# Patient Record
Sex: Male | Born: 1950
Health system: Southern US, Community
[De-identification: ages and names within clinical notes are randomized; demographics above are authoritative.]

## PROBLEM LIST (undated history)

## (undated) DIAGNOSIS — I739 Peripheral vascular disease, unspecified: Secondary | ICD-10-CM

## (undated) DIAGNOSIS — E7800 Pure hypercholesterolemia, unspecified: Secondary | ICD-10-CM

## (undated) DIAGNOSIS — J302 Other seasonal allergic rhinitis: Secondary | ICD-10-CM

## (undated) DIAGNOSIS — F17200 Nicotine dependence, unspecified, uncomplicated: Secondary | ICD-10-CM

## (undated) DIAGNOSIS — R32 Unspecified urinary incontinence: Secondary | ICD-10-CM

## (undated) DIAGNOSIS — I10 Essential (primary) hypertension: Secondary | ICD-10-CM

## (undated) DIAGNOSIS — M1990 Unspecified osteoarthritis, unspecified site: Secondary | ICD-10-CM

## (undated) DIAGNOSIS — E119 Type 2 diabetes mellitus without complications: Secondary | ICD-10-CM

## (undated) DIAGNOSIS — I219 Acute myocardial infarction, unspecified: Secondary | ICD-10-CM

## (undated) HISTORY — DX: Essential (primary) hypertension: I10

## (undated) HISTORY — DX: Other seasonal allergic rhinitis: J30.2

## (undated) HISTORY — DX: Peripheral vascular disease, unspecified: I73.9

## (undated) HISTORY — DX: Unspecified osteoarthritis, unspecified site: M19.90

## (undated) HISTORY — DX: Unspecified urinary incontinence: R32

## (undated) HISTORY — DX: Type 2 diabetes mellitus without complications: E11.9

---

## 1997-12-09 HISTORY — PX: CORONARY STENT PLACEMENT: SHX1402

## 2000-05-07 ENCOUNTER — Emergency Department (HOSPITAL_COMMUNITY): Admission: EM | Admit: 2000-05-07 | Discharge: 2000-05-07 | Payer: Self-pay | Admitting: Emergency Medicine

## 2000-05-07 ENCOUNTER — Encounter: Payer: Self-pay | Admitting: Emergency Medicine

## 2000-08-30 ENCOUNTER — Ambulatory Visit (HOSPITAL_BASED_OUTPATIENT_CLINIC_OR_DEPARTMENT_OTHER): Admission: RE | Admit: 2000-08-30 | Discharge: 2000-08-30 | Payer: Self-pay | Admitting: Family Medicine

## 2002-10-31 ENCOUNTER — Emergency Department (HOSPITAL_COMMUNITY): Admission: EM | Admit: 2002-10-31 | Discharge: 2002-10-31 | Payer: Self-pay | Admitting: Emergency Medicine

## 2002-11-30 ENCOUNTER — Encounter: Admission: RE | Admit: 2002-11-30 | Discharge: 2003-02-28 | Payer: Self-pay | Admitting: Family Medicine

## 2006-06-30 ENCOUNTER — Ambulatory Visit: Admission: RE | Admit: 2006-06-30 | Discharge: 2006-06-30 | Payer: Self-pay | Admitting: Otolaryngology

## 2006-07-22 ENCOUNTER — Ambulatory Visit: Payer: Self-pay | Admitting: Cardiology

## 2006-08-25 ENCOUNTER — Encounter (INDEPENDENT_AMBULATORY_CARE_PROVIDER_SITE_OTHER): Payer: Self-pay | Admitting: *Deleted

## 2006-08-25 ENCOUNTER — Observation Stay (HOSPITAL_COMMUNITY): Admission: RE | Admit: 2006-08-25 | Discharge: 2006-08-26 | Payer: Self-pay | Admitting: Otolaryngology

## 2007-02-12 ENCOUNTER — Ambulatory Visit: Payer: Self-pay | Admitting: Cardiology

## 2007-02-12 LAB — CONVERTED CEMR LAB
ALT: 30 units/L (ref 0–40)
AST: 25 units/L (ref 0–37)
Albumin: 3.6 g/dL (ref 3.5–5.2)
Alkaline Phosphatase: 117 units/L (ref 39–117)
Bilirubin, Direct: 0.1 mg/dL (ref 0.0–0.3)
Cholesterol: 174 mg/dL (ref 0–200)
Direct LDL: 73.5 mg/dL
HDL: 41.5 mg/dL (ref >39.0)
Hgb A1c MFr Bld: 11.3 % — ABNORMAL HIGH (ref 4.6–6.0)
Total Bilirubin: 0.9 mg/dL (ref 0.3–1.2)
Total CHOL/HDL Ratio: 4.2
Total Protein: 7 g/dL (ref 6.0–8.3)
Triglycerides: 440 mg/dL (ref 0–149)
VLDL: 88 mg/dL — ABNORMAL HIGH (ref 0–40)

## 2008-10-13 ENCOUNTER — Ambulatory Visit: Payer: Self-pay | Admitting: Cardiology

## 2008-11-07 ENCOUNTER — Ambulatory Visit: Payer: Self-pay

## 2011-04-23 NOTE — Assessment & Plan Note (Signed)
[facility]                            CARDIOLOGY OFFICE NOTE   NAME:Shontz, JESSEE MEZERA                    MRN:          [id number]  DATE:10/13/2008                            DOB:          02-18-1951    Mr. Mester comes in today to establish with me as his cardiologist.  He  has been a former patient of Dr. Susa Griffins at [facility]  [facility] and then Dr. Randa Evens.   He is currently 60 years of age.  His risk factors for coronary disease  include hypertension, hyperlipidemia, type 2 diabetes, and obesity.  He  had become fairly sedentary having worked out a lot in the past.   His cardiac history began in 1999 when he had an acute inferior wall  infarct.  He ended up getting a bare metal stent to the right coronary  artery.  He had nonobstructive disease in his LAD.  His best I can tell  we have done no objective assessments, his coronaries here in quite some  time.   He is followed by Dr. Holley Bouche, a primary care.  He follows his  blood work, as well as manages his lipids, diabetes, and hypertension.   He denies any angina or ischemic equivalents.  He denies any orthopnea,  PND, or peripheral edema.  He has had no syncope, presyncope, or tachy  palpitations.  He does have sleep apnea, wear CPAP.   He is currently on Plavix 75 mg a day, glimepiride 2 mg daily,  lisinopril 20 mg a day, simvastatin 40 mg a day, metformin 1000 mg p.o.  b.i.d.   He is intolerant of ASPIRIN which causes bleeding from his stomach 3  days after he starts taking it.  He is also allergic to LATEX.   PHYSICAL EXAMINATION:  VITAL SIGNS:  Blood pressure is elevated today at  154/86, pulse is 90 and regular, weight is 245 which is actually up 22  pounds since March 2008.  HEENT:  Essentially normal.  Carotid upstrokes were equal bilaterally  without bruits, no JVD.  Thyroid is not enlarged.  Trachea is midline.  LUNGS:  Clear to auscultation and  percussion.  HEART:  Poorly appreciated PMI, soft S1 and S2.  ABDOMINAL:  Protuberant, good bowel sounds.  No midline bruit.  No  hepatomegaly.  EXTREMITIES:  No cyanosis, clubbing, or edema.  Pulses are intact.  NEURO:  Intact.  SKIN:  Unremarkable.   His electrocardiogram shows sinus rhythm, inferior wall infarct which is  old, and a slight left axis deviation.   I had a long talk today getting to Mr. Friedlander.  We talked about his  symptom complex with his MI and possible future angina or MI.  We have  also reviewed about how to activate 911.   I have encouraged him to lose weight and exercise 3 hours a week.   We will set up an exercise rest stress Myoview to objectively assess his  coronary disease, any progression there.  He will continue with  aggressive secondary risk factor modification to Dr. Tiburcio Pea.  I will see  him back otherwise in a year.     Thomas C. Daleen Squibb, MD, [facility]  Electronically Signed    TCW/MedQ  DD: 10/13/2008  DT: 10/14/2008  Job #: [id number]

## 2011-04-26 NOTE — Assessment & Plan Note (Signed)
[facility]                              CARDIOLOGY OFFICE NOTE   Clinton Harrington, Clinton Harrington                    MRN:          [id number]  DATE:07/22/2006                            DOB:          1951-12-09    PRIMARY CARE PHYSICIAN:  Dr. Majel Homer   REFERRING PHYSICIAN:  Dr. Ezzard Standing of ENT.   REASON FOR CONSULTATION:  The patient referred by Dr. Ezzard Standing for  preoperative cardiovascular assessment prior to sinus surgery.   HISTORY OF PRESENT ILLNESS:  The patient is a 60 year old gentleman who  suffered inferior myocardial infarction in 1999.  He was cared for at that  time by Dr. Alanda Amass.  He had a bare-metal stent placed in his mid right  coronary artery.  He had no other significant coronary disease, and the  ejection fraction was 45-50% at that time.  Since then, the patient tells me  that he has done well.  He denies any chest pain, exertional dyspnea, PND,  orthopnea, edema, palpitations, syncope, presyncope and claudication.  He  remains active, working in the Advertising copywriter at [employer].  He says he  can clearly go up a flight or two of stairs with no symptoms or limitations.   PAST MEDICAL HISTORY:  1. Coronary artery disease, status post inferior myocardial infarction and      bare-metal stent placement in the right coronary artery in 1999.  2. Sleep apnea.  3. GERD.  4. Diabetes mellitus.  5. Hypertension.   ALLERGIES:  ASPIRIN causes GI upset, questionable bleed.  He is allergic to  LATEX.   CURRENT MEDICATIONS:  The patient is unsure of his medications, and did not  bring them today.  He is taking metformin 500 mg twice per day.  He is  taking lisinopril, but is unclear of the dose.  He is taking a cholesterol  medicine, but cannot recall which one or the dose.  He is not taking aspirin  or Plavix.   SOCIAL HISTORY:  The patient is divorced with a 15 year old daughter, and a  single grandchild.  He works in the Naval architect at [employer].  He  enjoys Barrister's clerk.  He continues to smoke at least a pack per day.  He denies  alcohol and illicit drug use.  When I asked him about quitting, he says he  is not particularly interested in doing so.   FAMILY HISTORY:  The patient does not know any details of his father's  health.  His mother is alive and well at 47.  His brother is my patient, who  has premature onset coronary artery disease.  His daughter is alive and  well.   REVIEW OF SYSTEMS:  Partial dentures, occasionally allergic rhinitis.  Otherwise negative in detail, except as above.   PHYSICAL EXAMINATION:  GENERAL:  He is overweight, but otherwise well-  appearing, in no distress.  VITAL SIGNS:  Heart rate is 98, blood pressure 132/80 on the left.  He is 5  feet 8 inches tall, and weighs 242 pounds.  HEENT:  Normal.  SKIN:  Exam is normal.  MUSCULOSKELETAL:  Exam is normal.  NECK:  He has no jugular venous distention, thyromegaly or lymphadenopathy.  LUNGS:  Clear to auscultation.  Respiratory effort is normal.  CARDIAC:  He has a nondisplaced point of maximal cardiac impulse.  There is  a regular rate and rhythm without murmur, rub or gallop.  ABDOMEN:  Soft, nondistended and nontender.  There is no hepatosplenomegaly.  Bowel sounds are normal.  EXTREMITIES:  Warm without clubbing, cyanosis, edema or ulceration.  Carotid  pulses are 2+ bilaterally without bruit.  Femoral pulses 2+ bilaterally  without bruit.  PT pulses 2+ bilaterally.  NEUROLOGIC:  He is alert and oriented x3 with normal neurologic exam and  normal affect.   Electrocardiogram demonstrates normal sinus rhythm with old inferior  myocardial infarction.   IMPRESSION/RECOMMENDATIONS:  17. 60 year old gentleman with prior inferior myocardial infarction, but no      current symptoms of angina or congestive heart failure, and good      exercise tolerance.  This places him at low risk for perioperative      cardiac  complication.  I have prescribed atenolol 50 mg per day to be      begun today and continued throughout the preoperative period.  This      will further reduce his perioperative risk.  I have asked him to      continue his statin throughout that period as well.   I will plan on seeing him back in 3 months' time to assess his tolerance of  the beta blocker, and potentially up-titrate his other medications for blood  pressure control, if necessary.  I will obtain records regarding his lipid  control from Dr. Daphine Deutscher.   Finally, it is clear that he needs an antiplatelet agent, given his coronary  disease.  He is convinced that he cannot tolerate aspirin, even at a reduced  dose and enteric-coated.  We will therefore plan on beginning Plavix after  his upcoming surgery.                                 Clinton Farber, Clinton Harrington    WED/MedQ  DD:  07/22/2006  DT:  07/23/2006  Job #:  [id number]

## 2011-04-26 NOTE — Assessment & Plan Note (Signed)
[facility]                            CARDIOLOGY OFFICE NOTE   Clinton Harrington, Clinton Harrington                    MRN:          [id number]  DATE:02/12/2007                            DOB:          Nov 05, 1951    PRIMARY CARE PHYSICIAN:  Clinton Harrington.   HISTORY OF PRESENT ILLNESS:  [name] is a 60 year old gentleman who  suffered inferior myocardial infarction in 1999.  He had bare metal  stent placed in his right coronary artery.  The EF was 45-50% at that  time.   Since then, he has done nicely.  He has had no recurrent chest  discomfort, exertional dyspnea, PND, orthopnea, edema, claudication,  palpitations, syncope or presyncope.  He remains active, working in the  Advertising copywriter at [employer].   CURRENT MEDICATIONS:  1. Metformin 500 mg twice daily.  2. Lisinopril 10 mg daily.  3. Plavix 75 mg daily.  4. Zocor (question dose).   PHYSICAL EXAMINATION:  He is generally well appearing though overweight,  in no distress, with a heart rate 84, blood pressure 142/96 and weight  of 223 pounds.  He has no jugular venous distention, thyromegaly or  lymphadenopathy.  LUNGS:  Clear to auscultation.  He has a nondisplaced point of maximal  cardiac impulse.  There is a regular rate and rhythm without murmur,  rub, or gallop.  ABDOMEN:  Soft, nondistended, nontender.  There is no  hepatosplenomegaly.  Bowel sounds are normal . There is midline  pulsatile mass.  EXTREMITIES:  Warm and without edema.  Carotid pulses 2+ bilaterally  without bruit.   Electrocardiogram demonstrates normal sinus rhythm and old inferior  myocardial infarction.  It is unchanged from prior.   IMPRESSION RECOMMENDATIONS:  1. Coronary disease status post prior inferior myocardial infarction:      Continue Plavix as he is intolerant to aspirin.  Resume beta-      blocker and increase lisinopril to 20 mg daily given his diabetes.  2. Diabetes mellitus:  Managed by Dr. Stacie Harrington.  I  received a note from      his insurer questioning whether he is filling his prescriptions for      his metformin.  Will therefore check hemoglobin A1c so that this is      available to Dr. Stacie Harrington when he sees the patient next week.  3. Hypercholesterolemia:  Check lipids and LFTs today.  Continue Zocor      pending results.  4. Risk factor modification:  Recommended exercise in compliance with      low cholesterol diet.  5. Tobacco abuse:  Advised him to quit smoking entirely.     Clinton Farber, MD  Electronically Signed    WED/MedQ  DD: 02/12/2007  DT: 02/12/2007  Job #: [id number]   cc:   Clinton Crochet, MD

## 2011-04-26 NOTE — Op Note (Signed)
NAMEVIVAN, Clinton Harrington             ACCOUNT NO.:  [id number]   MEDICAL RECORD NO.:  [id number]          PATIENT TYPE:  OBV   LOCATION:  3305                         FACILITY:  [facility]   PHYSICIAN:  Kristine Garbe. Ezzard Standing, M.D.DATE OF BIRTH:  1950-12-19   DATE OF PROCEDURE:  08/25/2006  DATE OF DISCHARGE:                                 OPERATIVE REPORT   PREOPERATIVE DIAGNOSES:  1. Septal deviation to the right with right-sided nasal obstruction.  2. Turbinate hypertrophy.  3. Chronic bilateral sinus disease.  4. Obstructive sleep apnea.   POSTOPERATIVE DIAGNOSES:  1. Septal deviation to the right with right-sided nasal obstruction.  2. Turbinate hypertrophy.  3. Chronic bilateral sinus disease.  4. Obstructive sleep apnea.   OPERATIONS:  Septoplasty with bilateral inferior turbinate reductions.  Functional endoscopic sinus surgery with bilateral ethmoidectomies and  bilateral maxillary osteal enlargement.   SURGEON:  Kristine Garbe. Ezzard Standing, M.D.   ANESTHESIA:  General endotracheal.   COMPLICATIONS:  None.   BRIEF CLINICAL NOTE:  Clinton Harrington is a 60 year old gentleman who has had  chronic nasal/sinus problems for a number of years.  He has chronic  obstruction mostly on the right side of his nose.  He recently underwent a  CT scan which showed a septal deviation to the right with chronic bilateral  ethmoid disease and some mucosal thickening within the frontal region, as  well as a cyst along the floor of the left maxillary sinus.  Sphenoid  sinuses were clear.  Because of chronic nasal obstruction and history of  obstructive sleep apnea and symptoms of pressure between his eyes secondary  to chronic sinus disease, he was taken to the operating room at this time  for septoplasty and turbinate reduction to help improve his nasal airway and  endoscopic sinus surgery to help improve the sinus patency.   DESCRIPTION OF PROCEDURE:  After adequate endotracheal anesthesia,  patient  received 1 gm Ancef IV preoperatively.  The nose was prepped with Betadine  solution, draped out with sterile towels.  The nose was then further prepped  with cotton pledgets soaked in decongestant, and septum and turbinates were  injected with Xylocaine with epinephrine.  A hemitransfixed incision was  made along the caudal edges of the septum on the right side, mucoperiosteum.  Mucoperiosteal flaps elevated posteriorly.  Patient had a bony deviation to  the right with a spur more posterior to the right.  The portion of the  maxillary crest and bony spur to that protrusion to the right airway were  removed after elevating mucoperiosteal flaps on either side.  This allowed  the septum to return more to midline.   Next, ethmoidectomies were performed bilaterally.  A sickle knife was used  to incise the uncinate process.  The anterior and posterior ethmoidal cells  were then opened with the microdebrider as well as straight-through cup and  up-through cup biting forceps.  The maxillary ostium was identified.  It was  enlarged with biting through-cutting forceps on both sides.  On the right  side, the sinus was clear.  On the left side, there was a small benign-  appearing polyp or cyst along the floor of the sinus but everything else was  clear.  This was not obstructing.  There was a polypoid, very thickened  mucosa, some polypoid changes within the ethmoid area especially anteriorly  and superiorly in the nasal frontal region.  This was removed with up-  grasping forceps and the microdebrider.  After cleaning out the ethmoid  area, the procedure was completed.   Lastly, inferior turbinate reductions were performed.  On the left side, an  incision was made along the inferior edges of the turbinate.  Mucosal flaps  were elevated off the turbinate bone.  The turbinate bone was removed.  Submucosal cauterization was performed and the remaining turbinate was out-  fractured.  On the  right side, the lower one-half of the turbinate was  amputated.  Suction cautery was used for hemostasis and the remaining  turbinate tissue was out-fractured.  This completed the procedure.   Kennedy sinus packs were placed within the ethmoid areas bilaterally.  The  hemitransfixed incision was closed with interrupted 4-0 chromic suture and  the nose was packed with Telfa soaked in Bacitracin ointment.  Clinton Harrington was  awoke from anesthesia and transferred to the recovery room, postoperatively  doing well.   DISPOSITION:  Clinton Harrington will be observed overnight to monitor his obstructive  sleep apnea.  Will plan on removing his nasal packs tomorrow morning and  discharging him home on Vicodin p.r.n. pain and Keflex 500 mg b.i.d. for 1  week.  Will have him follow up in my office in 3 days to have his Clinton Harrington  sinus packs removed.           ______________________________  Kristine Garbe Ezzard Standing, M.D.     CEN/MEDQ  D:  08/25/2006  T:  08/25/2006  Job:  [id number]   cc:   Royetta Crochet, MD

## 2012-08-05 ENCOUNTER — Emergency Department (HOSPITAL_COMMUNITY)
Admission: EM | Admit: 2012-08-05 | Discharge: 2012-08-05 | Disposition: A | Discharge status: Home / Self Care | Payer: Medicaid Other | Attending: Emergency Medicine | Admitting: Emergency Medicine

## 2012-08-05 ENCOUNTER — Encounter (HOSPITAL_COMMUNITY): Payer: Self-pay | Admitting: *Deleted

## 2012-08-05 DIAGNOSIS — Z888 Allergy status to other drugs, medicaments and biological substances status: Secondary | ICD-10-CM | POA: Insufficient documentation

## 2012-08-05 DIAGNOSIS — F17200 Nicotine dependence, unspecified, uncomplicated: Secondary | ICD-10-CM | POA: Insufficient documentation

## 2012-08-05 DIAGNOSIS — Z91040 Latex allergy status: Secondary | ICD-10-CM | POA: Insufficient documentation

## 2012-08-05 DIAGNOSIS — L0100 Impetigo, unspecified: Secondary | ICD-10-CM | POA: Insufficient documentation

## 2012-08-05 DIAGNOSIS — B354 Tinea corporis: Secondary | ICD-10-CM | POA: Insufficient documentation

## 2012-08-05 DIAGNOSIS — I252 Old myocardial infarction: Secondary | ICD-10-CM | POA: Insufficient documentation

## 2012-08-05 DIAGNOSIS — B350 Tinea barbae and tinea capitis: Secondary | ICD-10-CM | POA: Insufficient documentation

## 2012-08-05 DIAGNOSIS — E7800 Pure hypercholesterolemia, unspecified: Secondary | ICD-10-CM | POA: Insufficient documentation

## 2012-08-05 DIAGNOSIS — E119 Type 2 diabetes mellitus without complications: Secondary | ICD-10-CM | POA: Insufficient documentation

## 2012-08-05 HISTORY — DX: Acute myocardial infarction, unspecified: I21.9

## 2012-08-05 HISTORY — DX: Pure hypercholesterolemia, unspecified: E78.00

## 2012-08-05 MED ORDER — MUPIROCIN CALCIUM 2 % NA OINT: TOPICAL_OINTMENT | NASAL | Status: DC

## 2012-08-05 MED ORDER — MUPIROCIN CALCIUM 2 % EX CREA
TOPICAL_CREAM | Freq: Once | CUTANEOUS | Status: AC
  Administered 2012-08-05: 13:00:00 via TOPICAL
  Filled 2012-08-05: qty 15

## 2012-08-05 MED ORDER — OXYCODONE-ACETAMINOPHEN 5-325 MG PO TABS: 2.0000 | ORAL_TABLET | ORAL | Status: AC | PRN

## 2012-08-05 MED ORDER — CEPHALEXIN 500 MG PO CAPS: 500.0000 mg | ORAL_CAPSULE | Freq: Four times a day (QID) | ORAL | Status: AC

## 2012-08-05 MED ORDER — SULFAMETHOXAZOLE-TRIMETHOPRIM 800-160 MG PO TABS: 1.0000 | ORAL_TABLET | Freq: Two times a day (BID) | ORAL | Status: AC

## 2012-08-05 MED ORDER — KETOCONAZOLE 200 MG PO TABS: 200.0000 mg | ORAL_TABLET | Freq: Every day | ORAL | Status: DC

## 2012-08-05 MED ORDER — MICONAZOLE NITRATE 2 % EX CREA: TOPICAL_CREAM | Freq: Two times a day (BID) | CUTANEOUS | Status: DC

## 2012-08-05 NOTE — ED Notes (Signed)
Multiple red spots on both legs that itch x 2 weeks had been working outside he states now rash is on rt hand and behind ears  Also itchy

## 2012-08-05 NOTE — ED Provider Notes (Signed)
History     CSN: [id number]  Arrival date & time 08/05/12  8295   First MD Initiated Contact with Patient 08/05/12 1016      Chief Complaint  Patient presents with  . Rash    (Consider location/radiation/quality/duration/timing/severity/associated sxs/prior treatment) Patient is a 61 y.o. male presenting with rash. The history is provided by the patient and the spouse. No language interpreter was used.  Rash  This is a chronic problem. The current episode started more than 1 week ago. The problem has been gradually worsening. The problem is associated with chemical exposure. There has been no fever. The rash is present on the left lower leg and right lower leg. He has tried OTC analgesics for the symptoms. The treatment provided no relief.  61 yo male diabetic with red raised rash to bilateral LE x 2 weeks. And new Rash to R hand and behind R ear.  States that the rash keeps him up at night.  Has taken nsaids for pain without much relief.   Denies fever/n/v. Also states he has not taken his diabetes medicine today and he drank a coke.  pmh diabetes, mi, high cholesterol, staph infection.  Smoker.    Past Medical History  Diagnosis Date  . Myocardial infarct   . Diabetes mellitus   . High cholesterol     Past Surgical History  Procedure Date  . Coronary stent placement     History reviewed. No pertinent family history.  History  Substance Use Topics  . Smoking status: Current Everyday Smoker  . Smokeless tobacco: Not on file  . Alcohol Use:       Review of Systems  Constitutional: Negative.   HENT: Negative.   Eyes: Negative.   Respiratory: Negative.   Cardiovascular: Negative.   Gastrointestinal: Negative.   Musculoskeletal: Negative for joint swelling and gait problem.  Skin: Positive for rash.  Neurological: Negative.   Psychiatric/Behavioral: Negative.   All other systems reviewed and are negative.    Allergies  Aspirin and Latex  Home Medications    Current Outpatient Rx  Name Route Sig Dispense Refill  . GLIMEPIRIDE 4 MG PO TABS Oral Take 4 mg by mouth 2 (two) times daily.    Marland Kitchen LISINOPRIL 40 MG PO TABS Oral Take 40 mg by mouth daily.    Marland Kitchen METFORMIN HCL 1000 MG PO TABS Oral Take 1,000 mg by mouth 2 (two) times daily with a meal.    . SIMVASTATIN 40 MG PO TABS Oral Take 40 mg by mouth every evening.      BP 150/83  Pulse 90  Temp 98 F (36.7 C) (Oral)  Resp 20  SpO2 95%  Physical Exam  Nursing note and vitals reviewed. Constitutional: He is oriented to person, place, and time. He appears well-developed and well-nourished.  HENT:  Head: Normocephalic.  Eyes: Conjunctivae and EOM are normal. Pupils are equal, round, and reactive to light.  Neck: Normal range of motion. Neck supple.  Cardiovascular: Normal rate.   Pulmonary/Chest: Effort normal and breath sounds normal. No respiratory distress.  Abdominal: Soft.  Musculoskeletal: Normal range of motion. He exhibits no edema and no tenderness.  Neurological: He is alert and oriented to person, place, and time. No cranial nerve deficit. Coordination normal.  Skin: Skin is warm and dry. Rash noted.       Elevated erythematous, papular, annular lesions to R hand and behind L ear.  Crusty appearance.   Rash red raised to bilateral LE ? Infected.  Different appearance than upper body rash.    Psychiatric: He has a normal mood and affect.    ED Course  Procedures (including critical care time)  Labs Reviewed - No data to display No results found.   No diagnosis found.    MDM  Rash x 2 weeks to LE and several days to R hand and behind L ear.  Two separate rashes.  Impetigo to LE with treat with po antibiotics keflex and bactrim ds with topical bacrtroban.  Upper body rash appears to be tinea corporis and tinea capitis with treat with topical cream.  Elevated cbg after ingetsted coke and did not take diabetes meds.  Will take meds when she gets home, drink plenty of water and  no more soda.  Continue working on getting medicare/medicaid so he can go back to Dr. Tiburcio Pea.  Return to ER in 2 days for recheck.  Return sooner for n/v or fever.       Remi Haggard, NP 08/06/12 1610

## 2012-08-05 NOTE — ED Notes (Signed)
Pt reports approx a week hx of bilateral rash to lower legs associated with itching and pain. Pt reports no hx of shingles.

## 2012-08-07 ENCOUNTER — Emergency Department (HOSPITAL_COMMUNITY)
Admission: EM | Admit: 2012-08-07 | Discharge: 2012-08-07 | Disposition: A | Discharge status: Home / Self Care | Payer: Medicaid Other | Attending: Emergency Medicine | Admitting: Emergency Medicine

## 2012-08-07 ENCOUNTER — Encounter (HOSPITAL_COMMUNITY): Payer: Self-pay

## 2012-08-07 DIAGNOSIS — Z9861 Coronary angioplasty status: Secondary | ICD-10-CM | POA: Insufficient documentation

## 2012-08-07 DIAGNOSIS — I252 Old myocardial infarction: Secondary | ICD-10-CM | POA: Insufficient documentation

## 2012-08-07 DIAGNOSIS — Z886 Allergy status to analgesic agent status: Secondary | ICD-10-CM | POA: Insufficient documentation

## 2012-08-07 DIAGNOSIS — Z91040 Latex allergy status: Secondary | ICD-10-CM | POA: Insufficient documentation

## 2012-08-07 DIAGNOSIS — I2510 Atherosclerotic heart disease of native coronary artery without angina pectoris: Secondary | ICD-10-CM | POA: Insufficient documentation

## 2012-08-07 DIAGNOSIS — E119 Type 2 diabetes mellitus without complications: Secondary | ICD-10-CM | POA: Insufficient documentation

## 2012-08-07 DIAGNOSIS — R21 Rash and other nonspecific skin eruption: Secondary | ICD-10-CM | POA: Insufficient documentation

## 2012-08-07 DIAGNOSIS — F17200 Nicotine dependence, unspecified, uncomplicated: Secondary | ICD-10-CM | POA: Insufficient documentation

## 2012-08-07 DIAGNOSIS — E7800 Pure hypercholesterolemia, unspecified: Secondary | ICD-10-CM | POA: Insufficient documentation

## 2012-08-07 LAB — POCT I-STAT, CHEM 8
BUN: 9 mg/dL (ref 6–23)
Calcium, Ion: 1.18 mmol/L (ref 1.12–1.23)
Creatinine, Ser: 1 mg/dL (ref 0.50–1.35)
Glucose, Bld: 182 mg/dL — ABNORMAL HIGH (ref 70–99)
Hemoglobin: 16.3 g/dL (ref 13.0–17.0)
Sodium: 137 mEq/L (ref 135–145)
TCO2: 26 mmol/L (ref 0–100)

## 2012-08-07 LAB — CBC WITH DIFFERENTIAL/PLATELET
Basophils Relative: 1 % (ref 0–1)
Eosinophils Absolute: 0.2 10*3/uL (ref 0.0–0.7)
Eosinophils Relative: 2 % (ref 0–5)
Hemoglobin: 15.5 g/dL (ref 13.0–17.0)
MCH: 29.9 pg (ref 26.0–34.0)
MCHC: 35.1 g/dL (ref 30.0–36.0)
MCV: 85.3 fL (ref 78.0–100.0)
Monocytes Absolute: 0.5 10*3/uL (ref 0.1–1.0)
Monocytes Relative: 5 % (ref 3–12)
Neutrophils Relative %: 69 % (ref 43–77)

## 2012-08-07 MED ORDER — TRAMADOL-ACETAMINOPHEN 37.5-325 MG PO TABS
ORAL_TABLET | ORAL | Status: AC
Start: 2012-08-07 — End: 2012-08-17

## 2012-08-07 NOTE — ED Notes (Signed)
Pt presents for re-evaluation of skin infection.  Pt seen here on Wednesday for infection to both legs and R hand, placed on 2 abx, 2 topicals and pain medication.  Pt reports sites are not quite as painful but reports generalized itching since taking medication.  Pt reports taking pain medication at night and wakes up with itching.

## 2012-08-07 NOTE — ED Provider Notes (Signed)
Medical screening examination/treatment/procedure(s) were performed by non-physician practitioner and as supervising physician I was immediately available for consultation/collaboration.   Anelise Staron, MD 08/07/12 0725 

## 2012-08-07 NOTE — ED Provider Notes (Addendum)
History   This chart was scribed for Ward Givens, MD by Toya Smothers. The patient was seen in room TR05C/TR05C. Patient's care was started at 1414.  CSN: [id number]  Arrival date & time 08/07/12  1414   First MD Initiated Contact with Patient 08/07/12 1702     No chief complaint on file.  HPI  Clinton Harrington is a 61 y.o. male who presents to the Emergency Department complaining for follow up to ring worm and rash on his legs diagnosis. Pt reports that size and redness have decreased mildly in the past 2 days. Pt denotes that his feet have swollen since arrival to the ED. Pt reports that he has been using ointment and pain medication, though  has been causing upset stomach. Pt denies myalgia, emesis, nausea, and fever.  Pt states the rash started about 2 weeks ago. Starts as small red spots then gets bigger and are pruritic and painful. He denies fever. He states since he started on the antibiotics the pain is better, but he has run out of the ? T#3 he was given last visit (review of chart shows he was given oxycodone for the pain). Wife postulates he was exposed to poison oak/ivy while mowing, but the lesions do not look like contact dermatitis.   PCP none (states dismissed from [facility] on July 31st)  Past Medical History  Diagnosis Date  . Myocardial infarct   . Diabetes mellitus   . High cholesterol     Past Surgical History  Procedure Date  . Coronary stent placement     No family history on file.  History  Substance Use Topics  . Smoking status: Current Everyday Smoker  . Smokeless tobacco: Not on file  . Alcohol Use:   Lives at home Lives with spouse   Review of Systems  Constitutional: Negative for fever.       10 Systems reviewed and are negative for acute change except as noted in the HPI.  HENT: Negative for congestion.   Eyes: Negative for discharge and redness.  Respiratory: Negative for cough and shortness of breath.   Cardiovascular: Negative for chest pain.   Gastrointestinal: Negative for vomiting and abdominal pain.  Musculoskeletal: Negative for back pain.  Skin: Positive for rash.  Neurological: Negative for syncope, numbness and headaches.  Psychiatric/Behavioral:       No behavior change.  All other systems reviewed and are negative.    Allergies  Aspirin; Oxycodone-acetaminophen; and Latex  Home Medications   Current Outpatient Rx  Name Route Sig Dispense Refill  . CEPHALEXIN 500 MG PO CAPS Oral Take 1 capsule (500 mg total) by mouth 4 (four) times daily. 20 capsule 0  . GLIMEPIRIDE 4 MG PO TABS Oral Take 4 mg by mouth 2 (two) times daily.    Marland Kitchen LISINOPRIL 40 MG PO TABS Oral Take 40 mg by mouth daily.    Marland Kitchen METFORMIN HCL 1000 MG PO TABS Oral Take 1,000 mg by mouth 2 (two) times daily with a meal.    . MICONAZOLE NITRATE 2 % EX CREA Topical Apply topically 2 (two) times daily. 28.35 g 0  . OXYCODONE-ACETAMINOPHEN 5-325 MG PO TABS Oral Take 2 tablets by mouth every 4 (four) hours as needed for pain. 6 tablet 0  . SIMVASTATIN 40 MG PO TABS Oral Take 40 mg by mouth every evening.    . SULFAMETHOXAZOLE-TRIMETHOPRIM 800-160 MG PO TABS Oral Take 1 tablet by mouth every 12 (twelve) hours. 10 tablet 0  BP 130/88  Pulse 90  Temp 98.6 F (37 C) (Oral)  Resp 16  Ht 5\' 7"  (1.702 m)  Wt 225 lb (102.059 kg)  BMI 35.24 kg/m2  SpO2 99%  Vital signs normal    Physical Exam  Nursing note and vitals reviewed. Constitutional: He appears well-developed and well-nourished. No distress.  HENT:  Head: Normocephalic and atraumatic.  Right Ear: External ear normal.  Left Ear: External ear normal.  Mouth/Throat: Oropharynx is clear and moist.  Eyes: Conjunctivae are normal. Pupils are equal, round, and reactive to light. Right eye exhibits no discharge. Left eye exhibits no discharge. No scleral icterus.  Neck: Normal range of motion. Neck supple. No tracheal deviation present.  Cardiovascular: Normal rate.   Pulmonary/Chest: Effort  normal. No stridor. No respiratory distress.  Musculoskeletal: He exhibits no edema.  Neurological: He is alert. Cranial nerve deficit: no gross deficits.  Skin: Skin is warm and dry. No rash noted.       Scattered red round lesions on both lower legs that are variable in size and are not raised. Some have grey center. There are no blisters of vesicles. Non-linear. No red streaking. No swelling. No lesion to feet. Large lesion on the dorsum of the R hand consistent with ring worm.  Psychiatric: He has a normal mood and affect. His behavior is normal.    ED Course  Procedures (including critical care time) DIAGNOSTIC STUDIES: Oxygen Saturation is 98% on room air, normal by my interpretation.    COORDINATION OF CARE: 1755- Evaluated Pt. Pt is without distress  Lab work done to check platelet count and to do sed rate for possible vasculitis  Results for orders placed during the hospital encounter of 08/07/12  CBC WITH DIFFERENTIAL      Component Value Range   WBC 9.4  4.0 - 10.5 K/uL   RBC 5.18  4.22 - 5.81 MIL/uL   Hemoglobin 15.5  13.0 - 17.0 g/dL   HCT 11.9  14.7 - 82.9 %   MCV 85.3  78.0 - 100.0 fL   MCH 29.9  26.0 - 34.0 pg   MCHC 35.1  30.0 - 36.0 g/dL   RDW 56.2  13.0 - 86.5 %   Platelets 259  150 - 400 K/uL   Neutrophils Relative 69  43 - 77 %   Neutro Abs 6.5  1.7 - 7.7 K/uL   Lymphocytes Relative 23  12 - 46 %   Lymphs Abs 2.2  0.7 - 4.0 K/uL   Monocytes Relative 5  3 - 12 %   Monocytes Absolute 0.5  0.1 - 1.0 K/uL   Eosinophils Relative 2  0 - 5 %   Eosinophils Absolute 0.2  0.0 - 0.7 K/uL   Basophils Relative 1  0 - 1 %   Basophils Absolute 0.1  0.0 - 0.1 K/uL  SEDIMENTATION RATE      Component Value Range   Sed Rate 28 (*) 0 - 16 mm/hr  POCT I-STAT, CHEM 8      Component Value Range   Sodium 137  135 - 145 mEq/L   Potassium 4.5  3.5 - 5.1 mEq/L   Chloride 100  96 - 112 mEq/L   BUN 9  6 - 23 mg/dL   Creatinine, Ser 7.84  0.50 - 1.35 mg/dL   Glucose, Bld 696  (*) 70 - 99 mg/dL   Calcium, Ion 2.95  2.84 - 1.23 mmol/L   TCO2 26  0 - 100 mmol/L  Hemoglobin 16.3  13.0 - 17.0 g/dL   HCT 16.1  09.6 - 04.5 %    Laboratory interpretation all normal except minor elevation of SED rate and hyperglycemia     1. Rash    New Prescriptions   TRAMADOL-ACETAMINOPHEN (ULTRACET) 37.5-325 MG PER TABLET    2 tabs po QID prn pain    Plan discharge  Devoria Albe, MD, FACEP    MDM     I personally performed the services described in this documentation, which was scribed in my presence. The recorded information has been reviewed and considered. Devoria Albe, MD, FACEP    Ward Givens, MD 08/07/12 Rushie Goltz  Ward Givens, MD 08/07/12 [phone]

## 2012-08-07 NOTE — ED Notes (Signed)
Wife upset about the wait.  She was told to being him back today .  She feels like they should be taken back right away

## 2012-10-30 ENCOUNTER — Encounter: Payer: Self-pay | Admitting: Family Medicine

## 2012-10-30 ENCOUNTER — Ambulatory Visit (INDEPENDENT_AMBULATORY_CARE_PROVIDER_SITE_OTHER): Payer: Self-pay | Admitting: Family Medicine

## 2012-10-30 VITALS — BP 160/96 | HR 107 | Temp 98.1°F | Ht 67.0 in | Wt 225.0 lb

## 2012-10-30 DIAGNOSIS — I2510 Atherosclerotic heart disease of native coronary artery without angina pectoris: Secondary | ICD-10-CM

## 2012-10-30 DIAGNOSIS — F17200 Nicotine dependence, unspecified, uncomplicated: Secondary | ICD-10-CM

## 2012-10-30 DIAGNOSIS — E785 Hyperlipidemia, unspecified: Secondary | ICD-10-CM

## 2012-10-30 DIAGNOSIS — E119 Type 2 diabetes mellitus without complications: Secondary | ICD-10-CM | POA: Insufficient documentation

## 2012-10-30 DIAGNOSIS — Z720 Tobacco use: Secondary | ICD-10-CM | POA: Insufficient documentation

## 2012-10-30 DIAGNOSIS — I10 Essential (primary) hypertension: Secondary | ICD-10-CM

## 2012-10-30 LAB — COMPREHENSIVE METABOLIC PANEL
ALT: 49 U/L (ref 0–53)
AST: 38 U/L — ABNORMAL HIGH (ref 0–37)
Albumin: 4.1 g/dL (ref 3.5–5.2)
Alkaline Phosphatase: 101 U/L (ref 39–117)
Calcium: 9.3 mg/dL (ref 8.4–10.5)
Chloride: 95 mEq/L — ABNORMAL LOW (ref 96–112)
Potassium: 4 mEq/L (ref 3.5–5.1)
Sodium: 130 mEq/L — ABNORMAL LOW (ref 135–145)

## 2012-10-30 LAB — LIPID PANEL
HDL: 34.3 mg/dL — ABNORMAL LOW (ref >39.00)
Total CHOL/HDL Ratio: 7
VLDL: 120.4 mg/dL — ABNORMAL HIGH (ref 0.0–40.0)

## 2012-10-30 LAB — MICROALBUMIN / CREATININE URINE RATIO: Microalb, Ur: 30.9 mg/dL — ABNORMAL HIGH (ref 0.0–1.9)

## 2012-10-30 MED ORDER — PRAVASTATIN SODIUM 40 MG PO TABS: 40.0000 mg | ORAL_TABLET | Freq: Every day | ORAL | Status: DC

## 2012-10-30 MED ORDER — FREESTYLE LANCETS MISC: Status: DC

## 2012-10-30 MED ORDER — LISINOPRIL 40 MG PO TABS: 40.0000 mg | ORAL_TABLET | Freq: Every day | ORAL | Status: DC

## 2012-10-30 MED ORDER — GLIMEPIRIDE 4 MG PO TABS: 4.0000 mg | ORAL_TABLET | Freq: Two times a day (BID) | ORAL | Status: DC

## 2012-10-30 MED ORDER — GLUCOSE BLOOD VI STRP: ORAL_STRIP | Status: DC

## 2012-10-30 MED ORDER — METFORMIN HCL 1000 MG PO TABS: 1000.0000 mg | ORAL_TABLET | Freq: Two times a day (BID) | ORAL | Status: DC

## 2012-10-30 NOTE — Progress Notes (Signed)
Chief Complaint  Patient presents with  . Establish Care    HPI: Clinton Harrington is here to establish care. On review of chart appears was discharged from [facility] practice where he was followed by Dr. Tiburcio Pea. Has not worked since 2010 - terminated after working there for 10 years due to rudeness to customers. Pt reports this is false. Has looked for work and can't find it so applying for disability.  Ran out of all medications a week ago.  Has the following chronic problems and concerns today:  CAD: -1999 Acute inferior wall infarct, BMS to RCA -saw Dr. Daleen Squibb in 2009 and was supposed to get stress Myoview (pt reports stress test was ok) and follow up yearly - on plavix at the time, stopped this years ago -reports CAN NOT take ASA - upsets his stomach -did not follow up  -Denies: CP, SOB, palpitions  DM: -metformin 1000bid and glimeperide 4 mg bid - just ran out -Bld Glu 255 in ED in 07/2012 -BS monitoring at home: does not check at home, has monitor  -hasn't seen an eye doctor in sometime - reports vision is great  HLD: -on simvastatin 40mg  daily just ran out -wants to try different statin, this one upsets his stomach sometimes  HTN: -on lisinopril - but ran out of meds last week  Other Providers: -Dr. Daleen Squibb in cards: has not seen him since 2009 -Dr. Darrick Penna: seeing him for disability determination services. He says for pain in feet and ankles - shooting pains.   OSA: -has CPAP  Vaccines: refused flu vaccine, had tdap 4 years ago, doesn't remember having pneumovax  ROS: See pertinent positives and negatives per HPI.  Past Medical History  Diagnosis Date  . Myocardial infarct   . Diabetes mellitus   . High cholesterol   . Arthritis   . Diabetes   . Seasonal allergies   . Hypertension   . Urine incontinence     Family History  Problem Relation Age of Onset  . Arthritis Mother   . Heart disease Maternal Grandmother   . Hypertension Mother   . Mental illness Maternal  Grandmother     History   Social History  . Marital Status: Divorced    Spouse Name: N/A    Number of Children: N/A  . Years of Education: N/A   Social History Main Topics  . Smoking status: Current Every Day Smoker -- 1.0 packs/day    Types: Cigarettes  . Smokeless tobacco: None  . Alcohol Use: No  . Drug Use: None  . Sexually Active: None   Other Topics Concern  . None   Social History Narrative  . None    Current outpatient prescriptions:glimepiride (AMARYL) 4 MG tablet, Take 1 tablet (4 mg total) by mouth 2 (two) times daily., Disp: 160 tablet, Rfl: 3;  lisinopril (PRINIVIL,ZESTRIL) 40 MG tablet, Take 1 tablet (40 mg total) by mouth daily., Disp: 90 tablet, Rfl: 3;  metFORMIN (GLUCOPHAGE) 1000 MG tablet, Take 1 tablet (1,000 mg total) by mouth 2 (two) times daily with a meal., Disp: 180 tablet, Rfl: 3 [DISCONTINUED] glimepiride (AMARYL) 4 MG tablet, Take 4 mg by mouth 2 (two) times daily., Disp: , Rfl: ;  [DISCONTINUED] lisinopril (PRINIVIL,ZESTRIL) 40 MG tablet, Take 40 mg by mouth daily., Disp: , Rfl: ;  [DISCONTINUED] metFORMIN (GLUCOPHAGE) 1000 MG tablet, Take 1,000 mg by mouth 2 (two) times daily with a meal., Disp: , Rfl: ;  pravastatin (PRAVACHOL) 40 MG tablet, Take 1 tablet (40  mg total) by mouth daily., Disp: 90 tablet, Rfl: 3  EXAM:  Filed Vitals:   10/30/12 1042  BP: 160/96  Pulse: 107  Temp: 98.1 F (36.7 C)    Body mass index is 35.24 kg/(m^2).  GENERAL: vitals reviewed and listed above, alert, oriented, appears well hydrated and in no acute distress  HEENT: atraumatic, conjunttiva clear, no obvious abnormalities on inspection of external nose and ears  NECK: no obvious masses on inspection  LUNGS: clear to auscultation bilaterally, no wheezes, rales or rhonchi, good air movement  CV: HRRR, no peripheral edema  MS: moves all extremities without noticeable abnormality  PSYCH: pleasant and cooperative, no obvious depression or anxiety  ASSESSMENT  AND PLAN:  Discussed the following assessment and plan:  1. Diabetes  Hemoglobin A1c, Microalbumin, urine -CNA to ensure has supplies for home monitor -restarting meds -lifestyle changes -advised eye appointment -will do diabetic foot exam next appt -refilled meds on high dose of amaryl, reports fine on this and wants to continue - discussed risks  2. Hyperlipemia  Lipid Panel, CMP -restarting statin, he prefers to try different statin - trial of pravastatin  3. CAD (coronary artery disease)  Ambulatory referral to Cardiology -appointment details provided to patient -for management of CAD - pt reports can't afford plavix and can't take ASA - no CV symptoms - high risk   4. HTN (hypertension)  CMP Restarting BP med, close follow up  5. Tobacco use  -advised to quit, counseling and quitline info provided   -We reviewed the PMH, PSH, FH, SH, Meds and Allergies. -We provided refills for any medications we will prescribe as needed. -We addressed current concerns per orders and patient instructions. -We have asked for records for pertinent exams, studies, vaccines and notes from previous providers. -We have advised patient to follow up per instructions below. -Influenza and pneumo vaccines refused  -Patient advised to return or notify a doctor immediately if symptoms worsen or persist or new concerns arise.  Patient Instructions  -We have ordered labs or studies at this visit. It can take up to 1-2 weeks for results and processing. We will contact you with instructions IF your results are abnormal. Normal results will be released to your [facility]. If you have not heard from [name] or can not find your results in [facility] in 2 weeks please contact our office.  -We placed a referral to cardiology for you as discussed. It usually takes about 1-2 weeks to process and schedule this referral. If you have not heard from [name] regarding this appointment in 2 weeks please contact our office.   -PLEASE  SIGN UP FOR MYCHART TODAY   We recommend the following healthy lifestyle measures: - eat a healthy diet consisting of lots of vegetables, fruits, beans, nuts, seeds, healthy meats such as white chicken and fish and whole grains.  - avoid fried foods, fast food, processed foods, sodas, red meet and other fattening foods.  - get a least 150 minutes of aerobic exercise per week.   PLEASE BRING BLOOD SUGAR MONITOR TO NEXT VISIT PLEASE RESTART ALL MEDICATIONS PLEASE CALL QUITLINE AND QUIT SMOKING - PLEASE LET us KNOW HOW WE CAN HELP WITH THIS  Follow up in: 1 month      KIM, HANNAH R.

## 2012-10-30 NOTE — Patient Instructions (Addendum)
-  We have ordered labs or studies at this visit. It can take up to 1-2 weeks for results and processing. We will contact you with instructions IF your results are abnormal. Normal results will be released to your [facility]. If you have not heard from [name] or can not find your results in [facility] in 2 weeks please contact our office.  -We placed a referral to cardiology for you as discussed. It usually takes about 1-2 weeks to process and schedule this referral. If you have not heard from [name] regarding this appointment in 2 weeks please contact our office.   -PLEASE SIGN UP FOR MYCHART TODAY   We recommend the following healthy lifestyle measures: - eat a healthy diet consisting of lots of vegetables, fruits, beans, nuts, seeds, healthy meats such as white chicken and fish and whole grains.  - avoid fried foods, fast food, processed foods, sodas, red meet and other fattening foods.  - get a least 150 minutes of aerobic exercise per week.   PLEASE BRING BLOOD SUGAR MONITOR TO NEXT VISIT PLEASE RESTART ALL MEDICATIONS PLEASE CALL QUITLINE AND QUIT SMOKING - PLEASE LET us KNOW HOW WE CAN HELP WITH THIS  Follow up in: 1 month

## 2012-11-02 ENCOUNTER — Telehealth: Payer: Self-pay | Admitting: Family Medicine

## 2012-11-02 NOTE — Telephone Encounter (Signed)
Needs appointment in next 1 week. Diabetes out of control and cholesterol extremely high.

## 2012-11-02 NOTE — Telephone Encounter (Signed)
Called and spoke with pt.  Appt made for 11/10/12.  Pt aware.

## 2012-11-10 ENCOUNTER — Encounter: Payer: Self-pay | Admitting: Family Medicine

## 2012-11-10 ENCOUNTER — Ambulatory Visit (INDEPENDENT_AMBULATORY_CARE_PROVIDER_SITE_OTHER): Payer: Self-pay | Admitting: Family Medicine

## 2012-11-10 VITALS — BP 140/100 | HR 108 | Temp 97.6°F | Wt 227.0 lb

## 2012-11-10 DIAGNOSIS — E1149 Type 2 diabetes mellitus with other diabetic neurological complication: Secondary | ICD-10-CM

## 2012-11-10 DIAGNOSIS — E1165 Type 2 diabetes mellitus with hyperglycemia: Secondary | ICD-10-CM

## 2012-11-10 DIAGNOSIS — E1142 Type 2 diabetes mellitus with diabetic polyneuropathy: Secondary | ICD-10-CM

## 2012-11-10 DIAGNOSIS — E785 Hyperlipidemia, unspecified: Secondary | ICD-10-CM

## 2012-11-10 DIAGNOSIS — I10 Essential (primary) hypertension: Secondary | ICD-10-CM

## 2012-11-10 DIAGNOSIS — F17200 Nicotine dependence, unspecified, uncomplicated: Secondary | ICD-10-CM

## 2012-11-10 DIAGNOSIS — Z720 Tobacco use: Secondary | ICD-10-CM

## 2012-11-10 MED ORDER — GLUCOSE BLOOD VI STRP: ORAL_STRIP | Status: DC

## 2012-11-10 MED ORDER — FREESTYLE LANCETS MISC: Status: DC

## 2012-11-10 MED ORDER — CARVEDILOL 3.125 MG PO TABS: 3.1250 mg | ORAL_TABLET | Freq: Two times a day (BID) | ORAL | Status: DC

## 2012-11-10 NOTE — Patient Instructions (Addendum)
FOR YOUR DIABETES: -STOP the amaryl, CONTINUE the Metformin  Add Insulin - start with 10 units nightly at 10 pm  Check FASTING BLOOD SUGAR (before eating anything) every morning at 8am and keep a written log. Every THREE (3) days, if mean FASTING BLOOD SUGAR higher then 130 --> increase insulin by 2 units Please call us if you have ANY questions about this. If you feel unwell at anytime please check you blood sugar.  If you EVER have a low blood sugar value (Less then 70) eat a snack or drink 1/4 cup of orange juice and call your doctor immediately.  FOR YOUR HEART: -continue you blood pressure and cholesterol medications with the ADDITION COREG TWICE DAILY -follow up with the cardiologist as scheduled  We recommend the following healthy lifestyle measures: - eat a healthy diet consisting of lots of vegetables, fruits, beans, nuts, seeds, healthy meats such as white chicken and fish and whole grains.  - avoid fried foods, fast food, processed foods, sodas, sweetened beverages and red meet and other fattening foods.  - get at least 150 minutes of aerobic exercise per week.

## 2012-11-10 NOTE — Progress Notes (Signed)
Chief Complaint  Patient presents with  . Follow-up    diabetes     HPI: Clinton Harrington is relatively new to my practice. On review of chart appears was discharged from [facility] practice where he was followed by Dr. Tiburcio Pea. Has not worked since 2010 - terminated after working there for 10 years. He is applying for disability.  Ran out of all medications a few weeks ago. Medication refilled at [facility]  Has the following chronic problems and concerns today:  CAD: -1999 Acute inferior wall infarct, BMS to RCA -saw Dr. Daleen Squibb in 2009 and was supposed to get stress Myoview (pt reports stress test was ok) and follow up yearly - on plavix at the time, stopped this years ago due to cost and never followed up with cards -reports CAN NOT take ASA - upsets his stomach -did not follow up - has appt to see cards next week -Denies: CP, SOB, palpitations, swelling, HA -on statin, acei. Not on BB previously per pt. Advised starting coreg.  DM: -metformin 1000bid and glimeperide 4 mg bid - restarted last visit and lifestyle recs made -HgbA1c 10.1 last visit after being out of meds for some time -BS monitoring at home:  -recs to see eye doctor yearly last visit -denies: foot lesions, polyuria, polydipsia, vision changes, fevers, hypoglycemia -neuropathy  HLD: -statin restarted last visit - had run out of this and LDL reflected this   HTN: -lisinopril restarted last visit  Other Providers: -Dr. Daleen Squibb in cards: has not seen him since 2009 -Dr. Darrick Penna: seeing him for disability determination services. He says for pain in feet and ankles - shooting pains.   OSA: -has CPAP  Vaccines: refused flu vaccine, had tdap 4 years ago, doesn't remember having pneumovax  ROS: See pertinent positives and negatives per HPI.  Past Medical History  Diagnosis Date  . Myocardial infarct   . Diabetes mellitus   . High cholesterol   . Arthritis   . Diabetes   . Seasonal allergies   . Hypertension   . Urine  incontinence     Family History  Problem Relation Age of Onset  . Arthritis Mother   . Heart disease Maternal Grandmother   . Hypertension Mother   . Mental illness Maternal Grandmother     History   Social History  . Marital Status: Divorced    Spouse Name: N/A    Number of Children: N/A  . Years of Education: N/A   Social History Main Topics  . Smoking status: Current Every Day Smoker -- 1.0 packs/day    Types: Cigarettes  . Smokeless tobacco: None  . Alcohol Use: No  . Drug Use: None  . Sexually Active: None   Other Topics Concern  . None   Social History Narrative  . None    Current outpatient prescriptions:glucose blood test strip, Use as instructed, Disp: 100 each, Rfl: 12;  insulin glargine (LANTUS) 100 UNIT/ML injection, Inject 10 Units into the skin daily., Disp: , Rfl: ;  Lancets (FREESTYLE) lancets, Use as instructed, Disp: 100 each, Rfl: 12;  lisinopril (PRINIVIL,ZESTRIL) 40 MG tablet, Take 1 tablet (40 mg total) by mouth daily., Disp: 90 tablet, Rfl: 3 metFORMIN (GLUCOPHAGE) 1000 MG tablet, Take 1 tablet (1,000 mg total) by mouth 2 (two) times daily with a meal., Disp: 180 tablet, Rfl: 3;  pravastatin (PRAVACHOL) 40 MG tablet, Take 1 tablet (40 mg total) by mouth daily., Disp: 90 tablet, Rfl: 3;  carvedilol (COREG) 3.125 MG tablet, Take 1  tablet (3.125 mg total) by mouth 2 (two) times daily with a meal., Disp: 60 tablet, Rfl: 3 glucose blood (FREESTYLE LITE) test strip, Use as instructed, Disp: 100 each, Rfl: 12;  Lancets (FREESTYLE) lancets, Use as instructed, Disp: 100 each, Rfl: 12  EXAM:  Filed Vitals:   11/10/12 1010  BP: 140/100  Pulse: 108  Temp: 97.6 F (36.4 C)    There is no height on file to calculate BMI.  GENERAL: vitals reviewed and listed above, alert, oriented, appears well hydrated and in no acute distress  HEENT: atraumatic, conjunttiva clear, no obvious abnormalities on inspection of external nose and ears  NECK: no obvious masses  on inspection  LUNGS: clear to auscultation bilaterally, no wheezes, rales or rhonchi, good air movement  CV: HRRR, no peripheral edema  MS: moves all extremities without noticeable abnormality  DIABETIC FOOT EXAM: see this section of chart  PSYCH: pleasant and cooperative, no obvious depression or anxiety  ASSESSMENT AND PLAN:  Discussed the following assessment and plan:  1. Diabetes  -counseled extensively on implications of uncontrolled diabetes and management options -opted for long acting insulin with metformin - will home titrate -instruction provided by CNA -supplies for monitor and lantus samples provided by CNA and forms completed to help this uninsured pt gt lantus -lifestyle changes -advised eye appointment -f/u 3 months, if not at goal will add mealtime insulin -foot care and regular foot exams -diabetic foot exam today  2. Hyperlipemia  -continue statin, will recheck fasting lipids at follow up  3. CAD (coronary artery disease)  Ambulatory referral to Cardiology, advised adding BB and likely would benefit from ASA of plavix - pt to discuss with cards  4. HTN On acei, adding BB - discussed risks/benefits  5. Tobacco use  -advised to quit, counseling and quitline info provided   -Influenza and pneumo vaccines refused  -Patient advised to return or notify a doctor immediately if symptoms worsen or persist or new concerns arise.  Patient Instructions  FOR YOUR DIABETES: -STOP the amaryl, CONTINUE the Metformin  Add Insulin - start with 10 units nightly at 10 pm  Check FASTING BLOOD SUGAR (before eating anything) every morning at 8am and keep a written log. Every THREE (3) days, if mean FASTING BLOOD SUGAR higher then 130 --> increase insulin by 2 units Please call us if you have ANY questions about this. If you feel unwell at anytime please check you blood sugar.  If you EVER have a low blood sugar value (Less then 70) eat a snack or drink 1/4 cup of orange juice  and call your doctor immediately.  FOR YOUR HEART: -continue you blood pressure and cholesterol medications with the ADDITION COREG TWICE DAILY -follow up with the cardiologist as scheduled  We recommend the following healthy lifestyle measures: - eat a healthy diet consisting of lots of vegetables, fruits, beans, nuts, seeds, healthy meats such as white chicken and fish and whole grains.  - avoid fried foods, fast food, processed foods, sodas, sweetened beverages and red meet and other fattening foods.  - get at least 150 minutes of aerobic exercise per week.       Kriste Basque R.

## 2012-11-11 ENCOUNTER — Telehealth: Payer: Self-pay | Admitting: Family Medicine

## 2012-11-11 NOTE — Telephone Encounter (Signed)
Patient Information:  Caller Name: Elber  Phone: [phone]  Patient: Clinton Harrington, Clinton Harrington  Gender: Male  DOB: Feb 27, 1951  Age: 61 Years  PCP: Kriste Basque [facility])   Symptoms  Reason For Call & Symptoms: Was started on a new medicine for diabetes 12-3 and has caused abdominal cramping and diarrhea 12-4. Wants to stop medicine  Reviewed Health History In EMR: Yes  Reviewed Medications In EMR: Yes  Reviewed Allergies In EMR: Yes  Reviewed Surgeries / Procedures: Yes  Date of Onset of Symptoms: 11/11/2012  Guideline(s) Used:  Diarrhea  Diarrhea  Disposition Per Guideline:   Home Care  Reason For Disposition Reached:   Mild diarrhea  Advice Given:  Fluids:  Drink more fluids, at least 8-10 glasses (8 oz or 240 ml) daily.  Supplement this with saltine crackers or soups to make certain that you are getting sufficient fluid and salt to meet your body's needs.  Avoid caffeinated beverages (Reason: caffeine is mildly dehydrating).  Fluids:  Drink more fluids, at least 8-10 glasses (8 oz or 240 ml) daily.  Supplement this with saltine crackers or soups to make certain that you are getting sufficient fluid and salt to meet your body's needs.  Avoid caffeinated beverages (Reason: caffeine is mildly dehydrating).  Office Follow Up:  Does the office need to follow up with this patient?: Yes  Instructions For The Office: Patient thinks new onset diarrhea is due to beginning Lantus 12-3. Wants MD to change medicine. Please call and advise if MD thinks is due to medicine.

## 2012-11-12 NOTE — Telephone Encounter (Signed)
Pls advise.  

## 2012-11-12 NOTE — Telephone Encounter (Signed)
I DO NOT think the lantus is causing this. If diarrhea is bad or if he is feeling unwell he should be seen.

## 2012-11-12 NOTE — Telephone Encounter (Signed)
Called and spoke with pt's mother and she states pt is resting.  Will attempt to call pt on 11/13/12.

## 2012-11-12 NOTE — Telephone Encounter (Addendum)
I am glad that he is willing to work on his diet. I would recommend given his very uncontrolled diabetes that he see a diabetes specialist (endocrinologisit) and get diabetes/nutrition education. Please let him know and place referrals. Needs regular follow up with me after seeing specialists.Thanks.

## 2012-11-12 NOTE — Telephone Encounter (Signed)
Called and spoke with pt and pt states he had a really bad taste in his mouth on Tuesday and problems with his stomach as well.  Pt states he is not going to take the Lantus and is not going to take the medication.  Pt states he has a solution that will drop his blood sugar 100 points in 3 days.  Pt states he will stop drinking Coke.  Advised pt multiple times that he needs to continue to take the Lantus but pt states he is going to stop it.  Pt states he is going to go back to the medication that he was taking before.  Advised pt multiple times to come in to the office to be seen but pt declined appt.

## 2012-11-16 NOTE — Telephone Encounter (Signed)
Pt refusing to current follow recommendations. Has f/u scheduled and will discuss other oral medications then and offer nutrition/diabetes education.

## 2012-11-16 NOTE — Telephone Encounter (Signed)
Called and spoke with pt and pt states. He will bring proof that his diabetes is back in control.  Pt states he would like to take Amaryl again and pt has stopped the Lantus.  Pt states he is going to take the medication for his heart.  Pt states he does not want the referrals at this time.  Pt would like to wait until next appt.

## 2012-11-18 ENCOUNTER — Ambulatory Visit: Payer: Self-pay | Admitting: Cardiovascular Disease

## 2012-11-30 ENCOUNTER — Ambulatory Visit: Payer: Self-pay | Admitting: Family Medicine

## 2012-12-11 ENCOUNTER — Ambulatory Visit: Payer: Self-pay | Admitting: Family Medicine

## 2013-01-08 ENCOUNTER — Ambulatory Visit: Payer: Self-pay | Admitting: Cardiovascular Disease

## 2013-01-11 ENCOUNTER — Ambulatory Visit: Payer: Self-pay | Admitting: Family Medicine

## 2013-03-08 ENCOUNTER — Other Ambulatory Visit: Payer: Self-pay | Admitting: Family Medicine

## 2013-03-08 NOTE — Telephone Encounter (Signed)
Pt states he has gone back to Dr. Tiburcio Pea and Dr. Selena Batten is no longer his PCP.

## 2013-03-08 NOTE — Telephone Encounter (Signed)
Please advised pt: -we have advised him to see cardiology, endocrinology and nutritionist and he has refused these recommendations and tx recommendations for his uncontrolled diabetes and has refused to follow up with [name]. I am concerned about his health. I advise a follow up appt in the next month (early morning appt so that we can check fasting labs) and help him. He needs to bring home blood sugar log to that appointment. Ok to refill coreg to get to that appt.

## 2014-03-24 ENCOUNTER — Encounter (INDEPENDENT_AMBULATORY_CARE_PROVIDER_SITE_OTHER): Payer: 59 | Admitting: Ophthalmology

## 2014-03-24 DIAGNOSIS — H43819 Vitreous degeneration, unspecified eye: Secondary | ICD-10-CM

## 2014-03-24 DIAGNOSIS — H2510 Age-related nuclear cataract, unspecified eye: Secondary | ICD-10-CM

## 2014-03-24 DIAGNOSIS — E1165 Type 2 diabetes mellitus with hyperglycemia: Secondary | ICD-10-CM

## 2014-03-24 DIAGNOSIS — H35039 Hypertensive retinopathy, unspecified eye: Secondary | ICD-10-CM

## 2014-03-24 DIAGNOSIS — I10 Essential (primary) hypertension: Secondary | ICD-10-CM

## 2014-03-24 DIAGNOSIS — H3530 Unspecified macular degeneration: Secondary | ICD-10-CM

## 2014-03-24 DIAGNOSIS — E1139 Type 2 diabetes mellitus with other diabetic ophthalmic complication: Secondary | ICD-10-CM

## 2014-03-24 DIAGNOSIS — E11319 Type 2 diabetes mellitus with unspecified diabetic retinopathy without macular edema: Secondary | ICD-10-CM

## 2015-03-27 ENCOUNTER — Ambulatory Visit (INDEPENDENT_AMBULATORY_CARE_PROVIDER_SITE_OTHER): Payer: Medicare Other | Admitting: Ophthalmology

## 2015-03-27 DIAGNOSIS — I10 Essential (primary) hypertension: Secondary | ICD-10-CM

## 2015-03-27 DIAGNOSIS — H35033 Hypertensive retinopathy, bilateral: Secondary | ICD-10-CM | POA: Diagnosis not present

## 2015-03-27 DIAGNOSIS — E11339 Type 2 diabetes mellitus with moderate nonproliferative diabetic retinopathy without macular edema: Secondary | ICD-10-CM | POA: Diagnosis not present

## 2015-03-27 DIAGNOSIS — E11329 Type 2 diabetes mellitus with mild nonproliferative diabetic retinopathy without macular edema: Secondary | ICD-10-CM | POA: Diagnosis not present

## 2015-03-27 DIAGNOSIS — H43813 Vitreous degeneration, bilateral: Secondary | ICD-10-CM

## 2015-03-27 DIAGNOSIS — E11319 Type 2 diabetes mellitus with unspecified diabetic retinopathy without macular edema: Secondary | ICD-10-CM

## 2015-11-01 ENCOUNTER — Other Ambulatory Visit: Payer: Self-pay | Admitting: Family Medicine

## 2015-11-01 DIAGNOSIS — I739 Peripheral vascular disease, unspecified: Secondary | ICD-10-CM

## 2015-11-07 ENCOUNTER — Ambulatory Visit (INDEPENDENT_AMBULATORY_CARE_PROVIDER_SITE_OTHER): Payer: Medicare Other | Admitting: Podiatry

## 2015-11-07 ENCOUNTER — Encounter: Payer: Self-pay | Admitting: Podiatry

## 2015-11-07 ENCOUNTER — Ambulatory Visit (INDEPENDENT_AMBULATORY_CARE_PROVIDER_SITE_OTHER): Payer: Medicare Other

## 2015-11-07 VITALS — BP 121/79 | HR 89 | Resp 12

## 2015-11-07 DIAGNOSIS — R52 Pain, unspecified: Secondary | ICD-10-CM | POA: Diagnosis not present

## 2015-11-07 DIAGNOSIS — L97421 Non-pressure chronic ulcer of left heel and midfoot limited to breakdown of skin: Secondary | ICD-10-CM

## 2015-11-07 MED ORDER — COLLAGENASE 250 UNIT/GM EX OINT: 1.0000 "application " | TOPICAL_OINTMENT | Freq: Every day | CUTANEOUS | Status: DC

## 2015-11-07 NOTE — Progress Notes (Signed)
   Subjective:    Patient ID: Clinton HammockReginald E Harrington, male    DOB: [birth year], 64 y.o.   MRN: [id number]  HPI 64 year old male presents the office showed concerns of the wound the back of his left heel which is been ongoing for partially 4-5 days that he has noticed. He states he is unsure how long the wound is been there but approximate that time he didn't know some blood in his shoes. He did notice primary care physician was prescribed Keflex today. He denies any pain to the wound denies any possible or surrounding redness or red streaks that he has noticed. There has been mild swelling. He doesn't area does get sore when walking. He is tried to soak his foot without any relief. No other complaints at this time.  Review of Systems  Skin: Positive for wound.  Allergic/Immunologic: Positive for environmental allergies.       Objective:   Physical Exam General: AAO x3, NAD  Dermatological: On the posterior aspect of the left heel there is a superficial drain wound with overlying a sharp measuring 3 x 1.8 cm. There is no probing, undermining or tunneling. There is faint erythema directly around the wound without any ascending cellulitis. There is no drainage or purulence. No crepitus or fluctuance. No malodor. No other open lesions or pre-ulcer lesions identified at this time.  Vascular: DP pulses 2/4, PT pulses decreased bilaterally, CRT less than 3 seconds  Neurological: Protective sensation decreased with Simms Weinstein monofilament, decreased vibratory sensation.  Muscular skeletal : Nopedal deformities bilateral. No pain, crepitus, or limitation noted with foot and ankle range of motion bilateral. Muscular strength 5/5 in all groups tested bilateral.  Gait: Unassisted, Nonantalgic.      Assessment & Plan:   64 year old male with left posterior heel eschar, ulceration  -X-rays were obtained and reviewed with the patient.  -Treatment options discussed including all alternatives, risks,  and complications -Etiology of symptoms were discussed -Prescribed Santyl. If he is unable to get this we will change to silvadene.  -Continue Keflex.  -Continue open back shoe.  -Recommended strict offloading to his heel with a pillow while laying down to avoid the pressure to his heels.  -I discussed at length with him the severity of heel wounds and these can be limb threatening.  -Monitor for any clinical signs or symptoms of infection and directed to call the office immediately should any occur or go to the ER. -Follow-up in 2 weeks or sooner if any problems arise. In the meantime, encouraged to call the office with any questions, concerns, change in symptoms.   Ovid CurdMatthew Wagoner, DPM

## 2015-11-07 NOTE — Patient Instructions (Signed)
Put a pillow under your heel to avoid your left heel from putting pressure on your heel while in bed or laying down.  You can apply santyl to the wound daily. Apply nickel thickness and cover with gauze.  Monitor for any signs/symptoms of infection. Call the office immediately if any occur or go directly to the emergency room. Call with any questions/concerns.

## 2015-11-08 ENCOUNTER — Ambulatory Visit
Admission: RE | Admit: 2015-11-08 | Discharge: 2015-11-08 | Disposition: A | Discharge status: Home / Self Care | Payer: Medicare Other | Source: Ambulatory Visit | Attending: Family Medicine | Admitting: Family Medicine

## 2015-11-08 ENCOUNTER — Telehealth: Payer: Self-pay | Admitting: *Deleted

## 2015-11-08 DIAGNOSIS — I739 Peripheral vascular disease, unspecified: Secondary | ICD-10-CM

## 2015-11-08 NOTE — Telephone Encounter (Signed)
I believe that Misty StanleyLisa was going to get him one of those foam boots for his heel wound to help take pressure off of it. I will discuss with [facility] Thursday. Thanks.

## 2015-11-08 NOTE — Telephone Encounter (Addendum)
Pt left name, DOB and phone number.  I was unable to leave a message the voice mail had not been set up.  Pt states he was told by an assistant that he was to get a boot, but no one has called him.  Pt informed padded heel offloading device was waiting at the [place] office for pick up.

## 2015-11-09 ENCOUNTER — Encounter: Payer: Self-pay | Admitting: Podiatry

## 2015-11-09 DIAGNOSIS — L97523 Non-pressure chronic ulcer of other part of left foot with necrosis of muscle: Secondary | ICD-10-CM | POA: Insufficient documentation

## 2015-11-10 ENCOUNTER — Telehealth: Payer: Self-pay | Admitting: *Deleted

## 2015-11-10 NOTE — Telephone Encounter (Signed)
Called patient and stated that the heel pillow was here and he could come and get it either today before 4 pm or Monday Misty StanleyLisa

## 2015-11-21 ENCOUNTER — Encounter: Payer: Self-pay | Admitting: Podiatry

## 2015-11-21 ENCOUNTER — Ambulatory Visit (INDEPENDENT_AMBULATORY_CARE_PROVIDER_SITE_OTHER): Payer: Medicare Other | Admitting: Podiatry

## 2015-11-21 VITALS — BP 159/62 | HR 63 | Resp 12

## 2015-11-21 DIAGNOSIS — L97421 Non-pressure chronic ulcer of left heel and midfoot limited to breakdown of skin: Secondary | ICD-10-CM | POA: Diagnosis not present

## 2015-11-21 MED ORDER — TRAMADOL HCL 50 MG PO TABS: 50.0000 mg | ORAL_TABLET | Freq: Two times a day (BID) | ORAL | Status: DC | PRN

## 2015-11-21 MED ORDER — DOXYCYCLINE HYCLATE 100 MG PO TABS: 100.0000 mg | ORAL_TABLET | Freq: Two times a day (BID) | ORAL | Status: DC

## 2015-11-22 ENCOUNTER — Telehealth: Payer: Self-pay | Admitting: *Deleted

## 2015-11-22 DIAGNOSIS — L97421 Non-pressure chronic ulcer of left heel and midfoot limited to breakdown of skin: Secondary | ICD-10-CM

## 2015-11-22 NOTE — Telephone Encounter (Addendum)
-----   Message from Vivi BarrackMatthew R Wagoner, DPM sent at 11/21/2015  4:37 PM EST ----- Can you please order a wound care consult for left posterior heel ulcer. Pt orders, clinicals and required referral sheet faxed.  Spoke with pt's mtr, and told her we were [facility] and they would call him to schedule.  Pt's mtr was concerned the appt would be too late to heal the wound, I told her we would follow pt until he was scheduled with Wound Care.  Pt's mtr wanted me to call and discuss with pt, I told her I would call again.  Pt requested refill of Santyl.  Dr. Ardelle AntonWagoner refilled as previously.

## 2015-11-23 ENCOUNTER — Encounter: Payer: Self-pay | Admitting: Podiatry

## 2015-11-23 NOTE — Progress Notes (Signed)
Patient ID: Clinton Harrington, male   DOB: 11/23[name]/1952, 64 y.o.   MRN: [id number]  Subjective: 64 year old male persists the office they for concerns of a wound to the posterior aspect of the left heel. He states that he does get some bloody drainage from the area times and he sometimes gets some malodor. Denies any pus Corrie DandyMary denies any swelling redness or red streaks. Since her last appointment he did have arterial studies performed which did reveal decreased situations the left leg and he is having surgery in January to help improve the circulation. Denies any systemic complaints as fevers, chills, nausea, vomiting. No calf pain, chest pain, shortness of breath.  Objective: AAO 3, NAD DP pulses palpable, PT pulses decreased Protective sensation decreased with Simms Weinstein monofilament, decreased vibratory sensation On the posterior aspect of the left heel is an eshcar with surrounding granulation tissue. The wound today measures about the same size a 3 x 1.8 cm. There does appear to be some hyperpigmentation around the heel as well which is new. There is no drainage or pus expressed from the wound there is no bogginess. There is no fluctuance or crepitus. No surrounding erythema, ascending cellulitis. No malodor at this time. No other open lesions or pre-ulcerative lesions. No pain with calf compression, swelling, warmth, erythema.  Assessment: 64 year old male left posterior heel wound with new hyperpigmentation around the heel.  Plan: -Treatment options discussed including all alternatives, risks, and complications -Arterial studies were reviewed. He has upcoming surgery. Continue follow up with vascular surgery. -Due to the heel wound and the potential severity of the wound which can be limb threatening the new hyperpigmentation around the wound I referred him to the wound care center for further evaluation. He does have offloading boots to continue to wear this. Wear open back shoes.  Discussed with him that when he sleeps she should not put his heels on the bed or any pressure when he lays down.  -Due to his clinical history of drainage and malodor will start doxycycline. -Monitor for any clinical signs or symptoms of infection and directed to call the office immediately should any occur or go to the ER. -Follow-up with me in 2 weeks unless he gets in the wound care center prior to this. Call any questions or concerns.  Ovid CurdMatthew Shameek Nyquist, DPM

## 2015-11-27 ENCOUNTER — Other Ambulatory Visit: Payer: Self-pay

## 2015-11-27 DIAGNOSIS — L97429 Non-pressure chronic ulcer of left heel and midfoot with unspecified severity: Secondary | ICD-10-CM

## 2015-11-27 MED ORDER — COLLAGENASE 250 UNIT/GM EX OINT: 1.0000 "application " | TOPICAL_OINTMENT | Freq: Every day | CUTANEOUS | Status: DC

## 2015-12-05 ENCOUNTER — Encounter: Payer: Self-pay | Admitting: Podiatry

## 2015-12-05 ENCOUNTER — Other Ambulatory Visit: Payer: Self-pay | Admitting: *Deleted

## 2015-12-05 ENCOUNTER — Ambulatory Visit (INDEPENDENT_AMBULATORY_CARE_PROVIDER_SITE_OTHER): Payer: Medicare Other | Admitting: Podiatry

## 2015-12-05 ENCOUNTER — Telehealth: Payer: Self-pay | Admitting: *Deleted

## 2015-12-05 VITALS — BP 170/105 | HR 76 | Temp 98.3°F | Resp 16

## 2015-12-05 DIAGNOSIS — L97421 Non-pressure chronic ulcer of left heel and midfoot limited to breakdown of skin: Secondary | ICD-10-CM

## 2015-12-05 DIAGNOSIS — R200 Anesthesia of skin: Secondary | ICD-10-CM

## 2015-12-05 MED ORDER — TRAMADOL HCL 50 MG PO TABS
50.0000 mg | ORAL_TABLET | Freq: Two times a day (BID) | ORAL | Status: DC | PRN
Start: 2015-12-05 — End: 2016-03-04

## 2015-12-05 NOTE — Telephone Encounter (Signed)
OK - thanks

## 2015-12-05 NOTE — Patient Instructions (Signed)
Monitor for any signs/symptoms of infection. Call the office immediately if any occur or go directly to the emergency room. Call with any questions/concerns.  

## 2015-12-05 NOTE — Telephone Encounter (Signed)
Nanda QuintonJessica - [facility] states the Tramadol rx written today is very early.  Shanda BumpsJessica states pt got rx for 60 Tramadol about 5 days ago from Dr. Merri Brunetteandace Smith.  I told her to hold our Tramadol rx until Dr. Michaelle CopasSmith's rx was completed and I would inform Dr. Ardelle AntonWagoner.

## 2015-12-06 ENCOUNTER — Telehealth: Payer: Self-pay | Admitting: *Deleted

## 2015-12-06 NOTE — Telephone Encounter (Addendum)
Pt states he is interested in something for pain.  I told pt we had received a call from his pharmacy stating he had received a rx for Tramadol from Dr. Merri Brunetteandace Smith and that Dr. Gabriel RungWagoner's rx for Tramadol would overlap very early in the prescription period.  Pt asked that I check with Dr. Ardelle AntonWagoner anyway.  12/07/2015 - Dr. Gabriel RungWagoner's orders were called to pt.

## 2015-12-06 NOTE — Telephone Encounter (Signed)
He cannot have it if he just got it filled. He shouldn't need it filled at this time. If he does then he needs to see his PCP

## 2015-12-07 NOTE — Progress Notes (Signed)
Patient ID: [name], male   DOB: October 17, 1951, 64 y.o.   MRN: [id number]  Subjective: 64 year old male persists the office they for concerns of a wound to the posterior aspect of the left heel. He has an appointment in the wound care center in the next 2 weeks he's also have an angiogram apparently with vascular surgery on January 20. He states that he believes that the wound is looking better to the back of the heel. Denies any surrounding redness or red streaks or any drainage or pus. Denies any systemic complaints as fevers, chills, nausea, vomiting. No calf pain, chest pain, shortness of breath.  Objective: AAO 3, NAD DP pulses palpable, PT pulses decreased Protective sensation decreased with Simms Weinstein monofilament On the posterior aspect of the left heel is an eshcar with surrounding granulation tissue. The wound today measures slightly larger at 3.5 x 2 cm. the hyperpigmentation around the heel does appear to be somewhat resolved. There is no drainage or pus expressed from the wound. There is no ascending cellulitis. No edema. No clinical signs of infection at this time. No malodor.  No other open lesions or pre-ulcerative lesions. No pain with calf compression, swelling, warmth, erythema.  Assessment: 64 year old male left posterior heel wound with new hyperpigmentation around the heel.  Plan: -Treatment options discussed including all alternatives, risks, and complications -Small amount of hyperkeratotic periwound was debrided without complications or bleeding. Recommended continue with local wound care with Silvadene and a dressing daily. Strict offloading. Follow-up with wound care center as well as vascular surgery. Monitor for any clinical signs or symptoms of infection and directed to call the office immediately should any occur or go to the ER.  Clinton Harrington, DPM

## 2015-12-14 ENCOUNTER — Ambulatory Visit (INDEPENDENT_AMBULATORY_CARE_PROVIDER_SITE_OTHER): Payer: Medicare Other | Admitting: Neurology

## 2015-12-14 DIAGNOSIS — G609 Hereditary and idiopathic neuropathy, unspecified: Secondary | ICD-10-CM

## 2015-12-14 DIAGNOSIS — R200 Anesthesia of skin: Secondary | ICD-10-CM

## 2015-12-14 NOTE — Procedures (Signed)
[facility]  [address], [address]  [place], [place] [address] Tel: [phone] Fax:  [phone] Test Date:  12/14/2015  Patient: Clinton Harrington DOB: 11-29-1951 Physician: Nita Sickle, DO  Sex: Male Height: 5\' 7"  Ref Phys: Johny Blamer, M.D.  ID#: [id number] Temp: 33.3C Technician: Judie Petit. Dean   Patient Complaints: This is a 65 year-old gentleman with long history of diabetes referred for evaluation of bilateral leg paresthesias and shooting pain in the left lower extremity.  NCV & EMG Findings: Extensive electrodiagnostic testing of the left lower extremity and additional studies of the right shows: 1. Bilateral sural and superficial peroneal sensory responses are absent. 2. Bilateral peroneal motor responses recording at the extensor digitorum brevis shows markedly reduced amplitude and mild conduction velocity slowing. The left tibial motor response is asymmetrically reduced. Bilateral peroneal motor responses recording at the tibialis anterior and the right tibial motor response is within normal limits. 3. Bilateral tibial H reflex studies are absent. 4. Chronic motor axon loss changes are seen affecting muscles below the knee bilaterally conform to a gradient pattern. Active ongoing denervation is seen involving the medial gastrocnemius and flexor digitorum longus muscles.   Impression: The electrophysiologic findings are most consistent with active on chronic sensorimotor polyneuropathy, predominantly axon loss in type, affecting the lower extremities. Overall, these findings are severe in degree electrically.     ___________________________ Nita Sickle, DO    Nerve Conduction Studies Anti Sensory Summary Table   Site NR Peak (ms) Norm Peak (ms) P-T Amp (V) Norm P-T Amp  Left Sup Peroneal Anti Sensory (Ant Lat Mall)  33.3C  12 cm NR  <4.6  >3  Right Sup Peroneal Anti Sensory (Ant Lat Mall)  33.3C  12 cm NR  <4.6  >3  Left Sural Anti Sensory (Lat  Mall)  33.3C  Calf NR  <4.6  >3  Right Sural Anti Sensory (Lat Mall)  33.3C  Calf NR  <4.6  >3   Motor Summary Table   Site NR Onset (ms) Norm Onset (ms) O-P Amp (mV) Norm O-P Amp Site1 Site2 Delta-0 (ms) Dist (cm) Vel (m/s) Norm Vel (m/s)  Left Peroneal Motor (Ext Dig Brev)  33.3C  Ankle    5.2 <6.0 0.7 >2.5 B Fib Ankle 8.7 30.0 34 >40  B Fib    13.9  0.6  Poplt B Fib 2.7 10.0 37 >40  Poplt    16.6  0.6         Right Peroneal Motor (Ext Dig Brev)  33.3C  Ankle    5.5 <6.0 1.2 >2.5 B Fib Ankle 8.8 32.0 36 >40  B Fib    14.3  1.0  Poplt B Fib 2.9 10.0 34 >40  Poplt    17.2  0.8         Left Peroneal TA Motor (Tib Ant)  33.3C  Fib Head    3.8 <4.5 3.1 >3 Poplit Fib Head 2.1 10.0 48 >40  Poplit    5.9  3.0         Right Peroneal TA Motor (Tib Ant)  33.3C  Fib Head    3.1 <4.5 4.3 >3 Poplit Fib Head 2.1 10.0 48 >40  Poplit    5.2  4.2         Left Tibial Motor (Abd Hall Brev)  33.3C  Ankle    5.2 <6.0 0.8 >4 Knee Ankle 9.3 37.0 40 >40  Knee    14.5  0.9  Right Tibial Motor (Abd Hall Brev)  33.3C  Ankle    4.5 <6.0 4.1 >4 Knee Ankle 10.9 39.0 36 >40  Knee    15.4  3.3          H Reflex Studies   NR H-Lat (ms) Lat Norm (ms) L-R H-Lat (ms)  Left Tibial (Gastroc)  33.3C  NR  <35   Right Tibial (Gastroc)  33.3C  NR  <35    EMG   Side Muscle Ins Act Fibs Psw Fasc Number Recrt Dur Dur. Amp Amp. Poly Poly. Comment  Left AntTibialis Nml Nml Nml Nml 1- Rapid Some 1+ Some 1+ Some 1+ N/A  Left Gastroc Nml 1+ Nml Nml 2- Rapid Some 1+ Some 1+ Some 1+ N/A  Left Flex Dig Long Nml 1+ Nml Nml 3- Rapid Many 1+ Many 1+ Some 1+ N/A  Left RectFemoris Nml Nml Nml Nml Nml Nml Nml Nml Nml Nml Nml Nml N/A  Left GluteusMed Nml Nml Nml Nml Nml Nml Nml Nml Nml Nml Nml Nml N/A  Left BicepsFemS Nml Nml Nml Nml Nml Nml Nml Nml Nml Nml Nml Nml N/A  Right AntTibialis Nml Nml Nml Nml 1- Rapid Some 1+ Some 1+ Some 1+ N/A  Right Gastroc Nml 1+ Nml Nml 2- Rapid Some 1+ Some 1+ Some 1+ N/A    Right Flex Dig Long Nml 1+ Nml Nml 3- Rapid Some 1+ Some 1+ Some 1+ N/A  Left Low Lumbar PSP Nml Nml Nml Nml Nml Nml Nml Nml Nml Nml Nml Nml N/A      Waveforms:

## 2015-12-21 ENCOUNTER — Telehealth: Payer: Self-pay | Admitting: *Deleted

## 2015-12-21 MED ORDER — COLLAGENASE 250 UNIT/GM EX OINT: 1.0000 "application " | TOPICAL_OINTMENT | Freq: Every day | CUTANEOUS | Status: DC

## 2015-12-21 NOTE — Telephone Encounter (Signed)
Pt asked if he could get a refill of the Santyl to get him to his 1st Wound Care appt 12/25/2015.  Dr. Ardelle AntonWagoner states refill x1.  Done.

## 2015-12-22 ENCOUNTER — Encounter: Payer: Self-pay | Admitting: Vascular Surgery

## 2015-12-25 ENCOUNTER — Encounter (HOSPITAL_BASED_OUTPATIENT_CLINIC_OR_DEPARTMENT_OTHER): Payer: Medicare Other | Attending: Internal Medicine

## 2015-12-25 DIAGNOSIS — I10 Essential (primary) hypertension: Secondary | ICD-10-CM | POA: Insufficient documentation

## 2015-12-25 DIAGNOSIS — Z955 Presence of coronary angioplasty implant and graft: Secondary | ICD-10-CM | POA: Diagnosis not present

## 2015-12-25 DIAGNOSIS — G4730 Sleep apnea, unspecified: Secondary | ICD-10-CM | POA: Diagnosis not present

## 2015-12-25 DIAGNOSIS — F17210 Nicotine dependence, cigarettes, uncomplicated: Secondary | ICD-10-CM | POA: Insufficient documentation

## 2015-12-25 DIAGNOSIS — I2510 Atherosclerotic heart disease of native coronary artery without angina pectoris: Secondary | ICD-10-CM | POA: Insufficient documentation

## 2015-12-25 DIAGNOSIS — I252 Old myocardial infarction: Secondary | ICD-10-CM | POA: Insufficient documentation

## 2015-12-25 DIAGNOSIS — E11621 Type 2 diabetes mellitus with foot ulcer: Secondary | ICD-10-CM | POA: Insufficient documentation

## 2015-12-25 DIAGNOSIS — E1140 Type 2 diabetes mellitus with diabetic neuropathy, unspecified: Secondary | ICD-10-CM | POA: Diagnosis not present

## 2015-12-25 DIAGNOSIS — Z9989 Dependence on other enabling machines and devices: Secondary | ICD-10-CM | POA: Insufficient documentation

## 2015-12-25 DIAGNOSIS — Z7984 Long term (current) use of oral hypoglycemic drugs: Secondary | ICD-10-CM | POA: Insufficient documentation

## 2015-12-25 DIAGNOSIS — L97521 Non-pressure chronic ulcer of other part of left foot limited to breakdown of skin: Secondary | ICD-10-CM | POA: Insufficient documentation

## 2015-12-25 DIAGNOSIS — E11622 Type 2 diabetes mellitus with other skin ulcer: Secondary | ICD-10-CM | POA: Diagnosis not present

## 2015-12-25 DIAGNOSIS — L97421 Non-pressure chronic ulcer of left heel and midfoot limited to breakdown of skin: Secondary | ICD-10-CM | POA: Insufficient documentation

## 2015-12-29 ENCOUNTER — Encounter: Payer: Self-pay | Admitting: Vascular Surgery

## 2015-12-29 ENCOUNTER — Ambulatory Visit (INDEPENDENT_AMBULATORY_CARE_PROVIDER_SITE_OTHER): Payer: Medicare Other | Admitting: Vascular Surgery

## 2015-12-29 ENCOUNTER — Ambulatory Visit (INDEPENDENT_AMBULATORY_CARE_PROVIDER_SITE_OTHER)
Admission: RE | Admit: 2015-12-29 | Discharge: 2015-12-29 | Disposition: A | Discharge status: Home / Self Care | Payer: Medicare Other | Source: Ambulatory Visit | Attending: Vascular Surgery | Admitting: Vascular Surgery

## 2015-12-29 ENCOUNTER — Other Ambulatory Visit: Payer: Self-pay

## 2015-12-29 VITALS — BP 178/105 | HR 87 | Temp 98.3°F | Resp 16 | Ht 67.0 in | Wt 205.0 lb

## 2015-12-29 DIAGNOSIS — I70262 Atherosclerosis of native arteries of extremities with gangrene, left leg: Secondary | ICD-10-CM

## 2015-12-29 DIAGNOSIS — E119 Type 2 diabetes mellitus without complications: Secondary | ICD-10-CM

## 2015-12-29 DIAGNOSIS — R938 Abnormal findings on diagnostic imaging of other specified body structures: Secondary | ICD-10-CM | POA: Insufficient documentation

## 2015-12-29 DIAGNOSIS — I70269 Atherosclerosis of native arteries of extremities with gangrene, unspecified extremity: Secondary | ICD-10-CM | POA: Insufficient documentation

## 2015-12-29 DIAGNOSIS — L97429 Non-pressure chronic ulcer of left heel and midfoot with unspecified severity: Secondary | ICD-10-CM

## 2015-12-29 DIAGNOSIS — I10 Essential (primary) hypertension: Secondary | ICD-10-CM | POA: Insufficient documentation

## 2015-12-29 DIAGNOSIS — E7800 Pure hypercholesterolemia, unspecified: Secondary | ICD-10-CM

## 2015-12-29 DIAGNOSIS — E1051 Type 1 diabetes mellitus with diabetic peripheral angiopathy without gangrene: Secondary | ICD-10-CM | POA: Insufficient documentation

## 2015-12-29 NOTE — Progress Notes (Signed)
  Referred by:  William Harris, MD [address] [place], [place] [address]  Reason for referral: left heel and dorsum ulcer  History of Present Illness  Clinton Harrington is a 65 y.o. (02/10/1951) male who presents with chief complaint: painful left foot.  Onset of symptom occurred two months ago, heel ulcer followed by ulcer on dorsum of foot.  These wounds are associated with intermittent pain in the left fot.  Pain is described as sharp, severity 5-10/10, and associated with manipulating the left foot.  Patient has attempted to treat this pain with rest.  The patient has intermittent rest pain symptoms.  Atherosclerotic risk factors include: HLD, HTN, IDDM, and active smoking.  Past Medical History  Diagnosis Date  . Myocardial infarct (HCC)   . High cholesterol   . Arthritis   . Seasonal allergies   . Hypertension   . Urine incontinence   . Diabetes mellitus Jan. 1999  . Diabetes (HCC)     Past Surgical History  Procedure Laterality Date  . Coronary stent placement  Jan.  1999    Social History   Social History  . Marital Status: Divorced    Spouse Name: N/A  . Number of Children: N/A  . Years of Education: N/A   Occupational History  . Not on file.   Social History Main Topics  . Smoking status: Current Every Day Smoker -- 1.00 packs/day    Types: Cigarettes  . Smokeless tobacco: Never Used  . Alcohol Use: No  . Drug Use: No  . Sexual Activity: Not on file   Other Topics Concern  . Not on file   Social History Narrative    Family History  Problem Relation Age of Onset  . Arthritis Mother   . Heart disease Maternal Grandmother   . Hypertension Mother   . Mental illness Maternal Grandmother     Current Outpatient Prescriptions  Medication Sig Dispense Refill  . carvedilol (COREG) 3.125 MG tablet Take 1 tablet (3.125 mg total) by mouth 2 (two) times daily with a meal. 60 tablet 3  . collagenase (SANTYL) ointment Apply 1 application  topically daily. 15 g 0  . fluconazole (DIFLUCAN) 150 MG tablet   0  . gabapentin (NEURONTIN) 300 MG capsule   0  . glimepiride (AMARYL) 4 MG tablet 1 tablet    . glucose blood (FREESTYLE LITE) test strip Use as instructed 100 each 12  . glucose blood test strip Use as instructed 100 each 12  . insulin glargine (LANTUS) 100 UNIT/ML injection Inject 10 Units into the skin daily.    . Lancets (FREESTYLE) lancets Use as instructed 100 each 12  . Lancets (FREESTYLE) lancets Use as instructed 100 each 12  . lisinopril (PRINIVIL,ZESTRIL) 40 MG tablet Take 1 tablet (40 mg total) by mouth daily. 90 tablet 3  . metFORMIN (GLUCOPHAGE) 1000 MG tablet Take 1 tablet (1,000 mg total) by mouth 2 (two) times daily with a meal. 180 tablet 3  . pravastatin (PRAVACHOL) 40 MG tablet Take 1 tablet (40 mg total) by mouth daily. 90 tablet 3  . traMADol (ULTRAM) 50 MG tablet   0  . traMADol (ULTRAM) 50 MG tablet Take 1 tablet (50 mg total) by mouth every 12 (twelve) hours as needed. 30 tablet 0  . CHANTIX STARTING MONTH PAK 0.5 MG X 11 & 1 MG X 42 tablet     . doxycycline (VIBRA-TABS) 100 MG tablet     . HYDROcodone-acetaminophen (NORCO/VICODIN)   5-325 MG tablet     . traZODone (DESYREL) 50 MG tablet      No current facility-administered medications for this visit.    Allergies  Allergen Reactions  . Aspirin Other (See Comments)    Stomach upset  . Oxycodone-Acetaminophen Itching  . Latex Rash     REVIEW OF SYSTEMS:  (Positives checked otherwise negative)  CARDIOVASCULAR:   [ ] chest pain,  [ ] chest pressure,  [ ] palpitations,  [ ] shortness of breath when laying flat,  [ ] shortness of breath with exertion,   [x] pain in feet when walking,  [x] pain in feet when laying flat, [ ] history of blood clot in veins (DVT),  [ ] history of phlebitis,  [ ] swelling in legs,  [ ] varicose veins  PULMONARY:   [ ] productive cough,  [ ] asthma,  [ ] wheezing  NEUROLOGIC:   [ ] weakness in arms or  legs,  [ ] numbness in arms or legs,  [ ] difficulty speaking or slurred speech,  [ ] temporary loss of vision in one eye,  [ ] dizziness  HEMATOLOGIC:   [ ] bleeding problems,  [ ] problems with blood clotting too easily  MUSCULOSKEL:   [ ] joint pain, [ ] joint swelling  GASTROINTEST:   [ ] vomiting blood,  [ ] blood in stool     GENITOURINARY:   [ ] burning with urination,  [ ] blood in urine  PSYCHIATRIC:   [ ] history of major depression  INTEGUMENTARY:   [ ] rashes,  [x] ulcers  CONSTITUTIONAL:   [ ] fever,  [ ] chills   For VQI Use Only  PRE-ADM LIVING: Home  AMB STATUS: Ambulatory  CAD Sx: None  PRIOR CHF: None  STRESS TEST: [x] No, [ ] Normal, [ ] + ischemia, [ ] + MI, [ ] Both   Physical Examination  Filed Vitals:   12/29/15 1054 12/29/15 1101  BP: 172/99 178/105  Pulse: 88 87  Temp: 98.3 F (36.8 C)   TempSrc: Oral   Resp: 16   Height: 5' 7" (1.702 m)   Weight: 205 lb (92.987 kg)   SpO2: 97%    Body mass index is 32.1 kg/(m^2).  General: A&O x 3, WD, mildly obese,   Head: [place]/AT  Ear/Nose/Throat: Hearing grossly intact, nares w/o erythema or drainage, oropharynx w/o Erythema/Exudate, Mallampati score: 3  Eyes: PERRLA, EOMI  Neck: Supple, no nuchal rigidity, no palpable LAD  Pulmonary: Sym exp, good air movt, CTAB, no rales, rhonchi, & wheezing  Cardiac: RRR, Nl S1, S2, no Murmurs, rubs or gallops  Vascular: Vessel Right Left  Radial Palpable Palpable  Brachial Palpable Palpable  Carotid Palpable, without bruit Palpable, without bruit  Aorta Not palpable N/A  Femoral Palpable Palpable  Popliteal Not palpable Not palpable  PT Palpable Not Palpable  DP Palpable Not Palpable   Gastrointestinal: soft, NTND, -G/R, - HSM, - masses, - CVAT B  Musculoskeletal: M/S 5/5 throughout , RLE without ischemic changes, L heel dry gangrene (black eschar), dorsal ulcer with exposed fat overlying proximal MT  Neurologic: CN 2-12 intact  , Pain and light touch intact in extremities except decreased in R>L, Motor exam as listed above  Psychiatric: Judgment intact, Mood & affect appropriate for pt's clinical situation  Dermatologic: See M/S exam for extremity exam, no rashes otherwise noted  Lymph : No Cervical, Axillary, or Inguinal lymphadenopathy    Non-Invasive   Vascular Imaging  ABI (Date: 12/29/2015)  R: 1.01 (1.04), DP: tri, PT: tri, TBI: 0.64  L: 0.56 (0.51), DP: mono, PT: mono, TBI: 0.12  ABI likely falsely elevated so the TBI 0.12 more accurately reflects the severely limited perfusion of the L leg.   LLE arterial duplex (12/29/2015)  Triphasic down to distal SFA  Monophasic distally in all vessels   Outside Studies/Documentation 6 pages of outside documents were reviewed including: outside non-invasive studies, Podiatry chart.   Medical Decision Making  Neldon E Vien is a 64 y.o. male who presents with: LLE critical limb ischemia, possible L SFA stenosis, diabetes with complications   I discussed with the patient the natural history of critical limb ischemia: 25% require amputation in one year, 50% are able to maintain their limbs in one year, and 25-30% die in one year due to comorbidities.  Given the limb threatening status of this patient, I recommend an aggressive work up including proceeding with an: Aortogram, Bilateral runoff and possible intervention LLE I discussed with the patient the nature of angiographic procedures, especially the limited patencies of any endovascular intervention. The patient is aware of that the risks of an angiographic procedure include but are not limited to: bleeding, infection, access site complications, embolization, rupture of treated vessel, dissection, possible need for emergent surgical intervention, and possible need for surgical procedures to treat the patient's pathology. The patient is aware of the risks and agrees to proceed.  The procedure is scheduled  for: 23 JAN 17. If he is going to require surgical intervention, will plan on admitting post-procedure to expedite Cardiology work-up for a possible Wednesday operation.  I discussed in depth with the patient the nature of atherosclerosis, and emphasized the importance of maximal medical management including strict control of blood pressure, blood glucose, and lipid levels, antiplatelet agents, obtaining regular exercise, and cessation of smoking.  The patient is aware that without maximal medical management the underlying atherosclerotic disease process will progress, limiting the benefit of any interventions. The patient is currently on a statin:  Pravachol. The patient is currently not on an anti-platelet.  He will be started on one post-procedure, likely Plavix.  Thank you for allowing us to participate in this patient's care.   Tesha Archambeau, MD [facility] Office: [phone] Pager: [phone]  12/29/2015, 12:57 PM     

## 2016-01-01 ENCOUNTER — Inpatient Hospital Stay (HOSPITAL_COMMUNITY)
Admission: AD | Admit: 2016-01-01 | Discharge: 2016-01-05 | DRG: 253 | Disposition: A | Discharge status: Home Health | Payer: Medicare Other | Source: Ambulatory Visit | Attending: Vascular Surgery | Admitting: Vascular Surgery

## 2016-01-01 ENCOUNTER — Encounter (HOSPITAL_COMMUNITY)
Admission: AD | Disposition: A | Discharge status: Home Health | Payer: Medicare Other | Source: Ambulatory Visit | Attending: Vascular Surgery

## 2016-01-01 ENCOUNTER — Encounter (HOSPITAL_COMMUNITY): Payer: Self-pay | Admitting: *Deleted

## 2016-01-01 DIAGNOSIS — Z9861 Coronary angioplasty status: Secondary | ICD-10-CM | POA: Diagnosis not present

## 2016-01-01 DIAGNOSIS — Z79899 Other long term (current) drug therapy: Secondary | ICD-10-CM

## 2016-01-01 DIAGNOSIS — I2510 Atherosclerotic heart disease of native coronary artery without angina pectoris: Secondary | ICD-10-CM

## 2016-01-01 DIAGNOSIS — R32 Unspecified urinary incontinence: Secondary | ICD-10-CM | POA: Diagnosis present

## 2016-01-01 DIAGNOSIS — I7025 Atherosclerosis of native arteries of other extremities with ulceration: Secondary | ICD-10-CM | POA: Diagnosis not present

## 2016-01-01 DIAGNOSIS — Z419 Encounter for procedure for purposes other than remedying health state, unspecified: Secondary | ICD-10-CM

## 2016-01-01 DIAGNOSIS — L97429 Non-pressure chronic ulcer of left heel and midfoot with unspecified severity: Secondary | ICD-10-CM | POA: Diagnosis present

## 2016-01-01 DIAGNOSIS — I4510 Unspecified right bundle-branch block: Secondary | ICD-10-CM | POA: Diagnosis present

## 2016-01-01 DIAGNOSIS — Z955 Presence of coronary angioplasty implant and graft: Secondary | ICD-10-CM

## 2016-01-01 DIAGNOSIS — Z794 Long term (current) use of insulin: Secondary | ICD-10-CM

## 2016-01-01 DIAGNOSIS — L97529 Non-pressure chronic ulcer of other part of left foot with unspecified severity: Secondary | ICD-10-CM | POA: Diagnosis present

## 2016-01-01 DIAGNOSIS — F17210 Nicotine dependence, cigarettes, uncomplicated: Secondary | ICD-10-CM | POA: Diagnosis present

## 2016-01-01 DIAGNOSIS — I10 Essential (primary) hypertension: Secondary | ICD-10-CM | POA: Diagnosis present

## 2016-01-01 DIAGNOSIS — I252 Old myocardial infarction: Secondary | ICD-10-CM | POA: Diagnosis not present

## 2016-01-01 DIAGNOSIS — Z886 Allergy status to analgesic agent status: Secondary | ICD-10-CM | POA: Diagnosis not present

## 2016-01-01 DIAGNOSIS — E1152 Type 2 diabetes mellitus with diabetic peripheral angiopathy with gangrene: Principal | ICD-10-CM | POA: Diagnosis present

## 2016-01-01 DIAGNOSIS — E876 Hypokalemia: Secondary | ICD-10-CM | POA: Diagnosis present

## 2016-01-01 DIAGNOSIS — I70269 Atherosclerosis of native arteries of extremities with gangrene, unspecified extremity: Secondary | ICD-10-CM | POA: Diagnosis not present

## 2016-01-01 DIAGNOSIS — Z91040 Latex allergy status: Secondary | ICD-10-CM | POA: Diagnosis not present

## 2016-01-01 DIAGNOSIS — I70335 Atherosclerosis of unspecified type of bypass graft(s) of the right leg with ulceration of other part of foot: Secondary | ICD-10-CM | POA: Diagnosis not present

## 2016-01-01 DIAGNOSIS — Z7984 Long term (current) use of oral hypoglycemic drugs: Secondary | ICD-10-CM

## 2016-01-01 DIAGNOSIS — Z951 Presence of aortocoronary bypass graft: Secondary | ICD-10-CM

## 2016-01-01 DIAGNOSIS — E11621 Type 2 diabetes mellitus with foot ulcer: Secondary | ICD-10-CM | POA: Diagnosis present

## 2016-01-01 DIAGNOSIS — Z01810 Encounter for preprocedural cardiovascular examination: Secondary | ICD-10-CM | POA: Diagnosis not present

## 2016-01-01 DIAGNOSIS — E785 Hyperlipidemia, unspecified: Secondary | ICD-10-CM | POA: Diagnosis present

## 2016-01-01 DIAGNOSIS — E119 Type 2 diabetes mellitus without complications: Secondary | ICD-10-CM

## 2016-01-01 DIAGNOSIS — Z885 Allergy status to narcotic agent status: Secondary | ICD-10-CM | POA: Diagnosis not present

## 2016-01-01 HISTORY — PX: PERIPHERAL VASCULAR CATHETERIZATION: SHX172C

## 2016-01-01 LAB — POCT I-STAT, CHEM 8
BUN: 5 mg/dL — ABNORMAL LOW (ref 6–20)
CALCIUM ION: 1.15 mmol/L (ref 1.13–1.30)
Chloride: 97 mmol/L — ABNORMAL LOW (ref 101–111)
Creatinine, Ser: 0.8 mg/dL (ref 0.61–1.24)
Glucose, Bld: 120 mg/dL — ABNORMAL HIGH (ref 65–99)
HCT: 50 % (ref 39.0–52.0)
HEMOGLOBIN: 17 g/dL (ref 13.0–17.0)
Potassium: 3.4 mmol/L — ABNORMAL LOW (ref 3.5–5.1)
SODIUM: 138 mmol/L (ref 135–145)
TCO2: 28 mmol/L (ref 0–100)

## 2016-01-01 LAB — GLUCOSE, CAPILLARY
GLUCOSE-CAPILLARY: 114 mg/dL — AB (ref 65–99)
GLUCOSE-CAPILLARY: 132 mg/dL — AB (ref 65–99)
Glucose-Capillary: 94 mg/dL (ref 65–99)

## 2016-01-01 LAB — CBC
HEMATOCRIT: 43.1 % (ref 39.0–52.0)
HEMOGLOBIN: 14.4 g/dL (ref 13.0–17.0)
MCH: 27.7 pg (ref 26.0–34.0)
MCHC: 33.4 g/dL (ref 30.0–36.0)
MCV: 82.9 fL (ref 78.0–100.0)
Platelets: 247 10*3/uL (ref 150–400)
RBC: 5.2 MIL/uL (ref 4.22–5.81)
RDW: 14.1 % (ref 11.5–15.5)
WBC: 9.4 10*3/uL (ref 4.0–10.5)

## 2016-01-01 LAB — CREATININE, SERUM
CREATININE: 0.75 mg/dL (ref 0.61–1.24)
GFR calc Af Amer: >60 mL/min (ref >60)
GFR calc non Af Amer: >60 mL/min (ref >60)

## 2016-01-01 LAB — POCT ACTIVATED CLOTTING TIME: Activated Clotting Time: 317 s

## 2016-01-01 SURGERY — ABDOMINAL AORTOGRAM W/LOWER EXTREMITY

## 2016-01-01 MED ORDER — MIDAZOLAM HCL 2 MG/2ML IJ SOLN
INTRAMUSCULAR | Status: AC
  Filled 2016-01-01: qty 2

## 2016-01-01 MED ORDER — GABAPENTIN 300 MG PO CAPS
900.0000 mg | ORAL_CAPSULE | Freq: Three times a day (TID) | ORAL | Status: DC
  Administered 2016-01-01 – 2016-01-05 (×9): 900 mg via ORAL
  Filled 2016-01-01 (×2): qty 3
  Filled 2016-01-01: qty 9
  Filled 2016-01-01 (×6): qty 3

## 2016-01-01 MED ORDER — HEPARIN (PORCINE) IN NACL 2-0.9 UNIT/ML-% IJ SOLN
INTRAMUSCULAR | Status: AC
  Filled 2016-01-01: qty 1000

## 2016-01-01 MED ORDER — ENOXAPARIN SODIUM 40 MG/0.4ML ~~LOC~~ SOLN: 40.0000 mg | SUBCUTANEOUS | Status: DC

## 2016-01-01 MED ORDER — SODIUM CHLORIDE 0.9 % IV SOLN
INTRAVENOUS | Status: DC
  Administered 2016-01-01: 11:00:00 via INTRAVENOUS

## 2016-01-01 MED ORDER — POLYETHYLENE GLYCOL 3350 17 G PO PACK: 17.0000 g | PACK | Freq: Every day | ORAL | Status: DC | PRN

## 2016-01-01 MED ORDER — IODIXANOL 320 MG/ML IV SOLN
INTRAVENOUS | Status: DC | PRN
  Administered 2016-01-01: 90 mL via INTRAVENOUS

## 2016-01-01 MED ORDER — HYDRALAZINE HCL 20 MG/ML IJ SOLN
INTRAMUSCULAR | Status: AC
  Filled 2016-01-01: qty 1

## 2016-01-01 MED ORDER — LABETALOL HCL 5 MG/ML IV SOLN
10.0000 mg | INTRAVENOUS | Status: DC | PRN
  Administered 2016-01-02: 10 mg via INTRAVENOUS
  Filled 2016-01-01: qty 4

## 2016-01-01 MED ORDER — PANTOPRAZOLE SODIUM 40 MG PO TBEC
40.0000 mg | DELAYED_RELEASE_TABLET | Freq: Every day | ORAL | Status: DC
  Administered 2016-01-01 – 2016-01-05 (×4): 40 mg via ORAL
  Filled 2016-01-01 (×3): qty 1

## 2016-01-01 MED ORDER — SODIUM CHLORIDE 0.9 % IV SOLN
INTRAVENOUS | Status: DC
  Administered 2016-01-01 – 2016-01-02 (×2): via INTRAVENOUS
  Administered 2016-01-03: 75 mL/h via INTRAVENOUS
  Administered 2016-01-04: 22:00:00 via INTRAVENOUS

## 2016-01-01 MED ORDER — ENOXAPARIN SODIUM 40 MG/0.4ML ~~LOC~~ SOLN
40.0000 mg | SUBCUTANEOUS | Status: DC
  Administered 2016-01-02: 40 mg via SUBCUTANEOUS
  Filled 2016-01-01: qty 0.4

## 2016-01-01 MED ORDER — HEPARIN SODIUM (PORCINE) 1000 UNIT/ML IJ SOLN
INTRAMUSCULAR | Status: AC
  Filled 2016-01-01: qty 1

## 2016-01-01 MED ORDER — FENTANYL CITRATE (PF) 100 MCG/2ML IJ SOLN
INTRAMUSCULAR | Status: AC
  Filled 2016-01-01: qty 2

## 2016-01-01 MED ORDER — ONDANSETRON HCL 4 MG/2ML IJ SOLN
4.0000 mg | Freq: Four times a day (QID) | INTRAMUSCULAR | Status: DC | PRN
  Administered 2016-01-03: 4 mg via INTRAVENOUS
  Filled 2016-01-01: qty 2

## 2016-01-01 MED ORDER — HYDRALAZINE HCL 20 MG/ML IJ SOLN
5.0000 mg | INTRAMUSCULAR | Status: DC | PRN
Start: 2016-01-01 — End: 2016-01-05

## 2016-01-01 MED ORDER — SODIUM CHLORIDE 0.9 % IV SOLN: 1.0000 mL/kg/h | INTRAVENOUS | Status: AC

## 2016-01-01 MED ORDER — NICOTINE 21 MG/24HR TD PT24
21.0000 mg | MEDICATED_PATCH | Freq: Every day | TRANSDERMAL | Status: DC
  Administered 2016-01-02 – 2016-01-05 (×4): 21 mg via TRANSDERMAL
  Filled 2016-01-01 (×5): qty 1

## 2016-01-01 MED ORDER — CARVEDILOL 3.125 MG PO TABS
3.1250 mg | ORAL_TABLET | Freq: Two times a day (BID) | ORAL | Status: DC
  Administered 2016-01-01: 3.125 mg via ORAL

## 2016-01-01 MED ORDER — FENTANYL CITRATE (PF) 100 MCG/2ML IJ SOLN
INTRAMUSCULAR | Status: DC | PRN
  Administered 2016-01-01 (×2): 50 ug via INTRAVENOUS

## 2016-01-01 MED ORDER — MORPHINE SULFATE (PF) 2 MG/ML IV SOLN
2.0000 mg | INTRAVENOUS | Status: DC | PRN
  Administered 2016-01-01 – 2016-01-02 (×5): 2 mg via INTRAVENOUS
  Administered 2016-01-03 (×2): 4 mg via INTRAVENOUS
  Filled 2016-01-01 (×3): qty 1
  Filled 2016-01-01 (×3): qty 2
  Filled 2016-01-01: qty 1

## 2016-01-01 MED ORDER — INSULIN ASPART 100 UNIT/ML ~~LOC~~ SOLN: 0.0000 [IU] | Freq: Every day | SUBCUTANEOUS | Status: DC

## 2016-01-01 MED ORDER — MORPHINE SULFATE (PF) 2 MG/ML IV SOLN
INTRAVENOUS | Status: AC
  Filled 2016-01-01: qty 1

## 2016-01-01 MED ORDER — DOCUSATE SODIUM 100 MG PO CAPS
100.0000 mg | ORAL_CAPSULE | Freq: Two times a day (BID) | ORAL | Status: DC
  Administered 2016-01-01 – 2016-01-05 (×6): 100 mg via ORAL
  Filled 2016-01-01 (×7): qty 1

## 2016-01-01 MED ORDER — LIDOCAINE HCL (PF) 1 % IJ SOLN
INTRAMUSCULAR | Status: DC | PRN
Start: 2016-01-01 — End: 2016-01-01
  Administered 2016-01-01: 16:00:00

## 2016-01-01 MED ORDER — INSULIN ASPART 100 UNIT/ML ~~LOC~~ SOLN
0.0000 [IU] | Freq: Three times a day (TID) | SUBCUTANEOUS | Status: DC
  Administered 2016-01-02: 2 [IU] via SUBCUTANEOUS
  Administered 2016-01-02: 3 [IU] via SUBCUTANEOUS
  Administered 2016-01-02 – 2016-01-03 (×2): 2 [IU] via SUBCUTANEOUS
  Administered 2016-01-04 – 2016-01-05 (×4): 3 [IU] via SUBCUTANEOUS

## 2016-01-01 MED ORDER — METOPROLOL TARTRATE 1 MG/ML IV SOLN
2.0000 mg | INTRAVENOUS | Status: DC | PRN
  Administered 2016-01-03: 2.5 mg via INTRAVENOUS

## 2016-01-01 MED ORDER — LISINOPRIL 40 MG PO TABS
40.0000 mg | ORAL_TABLET | Freq: Every day | ORAL | Status: DC
  Administered 2016-01-01 – 2016-01-05 (×4): 40 mg via ORAL
  Filled 2016-01-01 (×4): qty 1

## 2016-01-01 MED ORDER — TRAMADOL HCL 50 MG PO TABS
50.0000 mg | ORAL_TABLET | Freq: Four times a day (QID) | ORAL | Status: DC | PRN
  Administered 2016-01-03 – 2016-01-04 (×2): 50 mg via ORAL
  Filled 2016-01-01 (×2): qty 1

## 2016-01-01 MED ORDER — MIDAZOLAM HCL 2 MG/2ML IJ SOLN
INTRAMUSCULAR | Status: DC | PRN
  Administered 2016-01-01: 1 mg via INTRAVENOUS

## 2016-01-01 MED ORDER — ONDANSETRON HCL 4 MG/2ML IJ SOLN: 4.0000 mg | Freq: Four times a day (QID) | INTRAMUSCULAR | Status: DC | PRN

## 2016-01-01 MED ORDER — ALUM & MAG HYDROXIDE-SIMETH 200-200-20 MG/5ML PO SUSP: 15.0000 mL | ORAL | Status: DC | PRN

## 2016-01-01 MED ORDER — BISACODYL 10 MG RE SUPP: 10.0000 mg | Freq: Every day | RECTAL | Status: DC | PRN

## 2016-01-01 MED ORDER — LIDOCAINE HCL (PF) 1 % IJ SOLN
INTRAMUSCULAR | Status: AC
  Filled 2016-01-01: qty 30

## 2016-01-01 SURGICAL SUPPLY — 22 items
BALLN LUTONIX 4X150X130 (BALLOONS) ×4
BALLN MUSTANG 4X150X135 (BALLOONS) ×4
BALLOON LUTONIX 4X150X130 (BALLOONS) IMPLANT
BALLOON MUSTANG 4X150X135 (BALLOONS) IMPLANT
CATH OMNI FLUSH 5F 65CM (CATHETERS) ×2 IMPLANT
CATH TEMPO AQUA 5F 100CM (CATHETERS) ×2 IMPLANT
COVER PRB 48X5XTLSCP FOLD TPE (BAG) IMPLANT
COVER PROBE 5X48 (BAG) ×4
DEVICE CONTINUOUS FLUSH (MISCELLANEOUS) ×2 IMPLANT
DEVICE TORQUE H2O (MISCELLANEOUS) ×2 IMPLANT
GUIDEWIRE ANGLED .035X260CM (WIRE) ×2 IMPLANT
KIT ENCORE 26 ADVANTAGE (KITS) ×2 IMPLANT
KIT PV (KITS) ×4 IMPLANT
SHEATH PINNACLE 5F 10CM (SHEATH) ×2 IMPLANT
SHEATH PINNACLE ST 6F 45CM (SHEATH) ×2 IMPLANT
SYR MEDRAD MARK V 150ML (SYRINGE) ×4 IMPLANT
TAPE RADIOPAQUE TURBO (MISCELLANEOUS) ×2 IMPLANT
TRANSDUCER W/STOPCOCK (MISCELLANEOUS) ×4 IMPLANT
TRAY PV CATH (CUSTOM PROCEDURE TRAY) ×4 IMPLANT
WIRE BENTSON .035X145CM (WIRE) ×2 IMPLANT
WIRE HI TORQ VERSACORE J 260CM (WIRE) ×2 IMPLANT
WIRE ROSEN-J .035X180CM (WIRE) ×2 IMPLANT

## 2016-01-01 NOTE — Consult Note (Addendum)
CARDIOLOGY CONSULT NOTE   Patient ID: ZAKERY NORMINGTON MRN: [id number] DOB/AGE: 03/17/1951 66 y.o.  Admit date: 01/01/2016  Primary Physician   Johny Blamer, MD Primary Cardiologist   New (previously Dr. Samule Ohm) Reason for Consultation   Pre-op clearance Requesting Physician Dr. Imogene Burn  HPI: DAETON KLUTH is a 65 y.o. male with a history of CAD, HL, HTN and DM with L diabetic foot ulcer and ongoing tobacco abuse who consulted for pre op clearance.   His cardiac history began in 1999 when he had an acute inferior wall infarct. He ended up getting a bare metal stent to the right coronary artery. He had nonobstructive disease in his LAD. Last seen by Dr. Daleen Squibb 10/14/2008. The patient is intolerance to aspirin.  The patient was seen by Dr. Imogene Burn for painful left foot x 2 months. He has left heel ulcer followed by ulcer on dorsum of foot.The patient had a Abdominal Aortogram w/Lower Extremity today, pending note.   The patient denies nausea, vomiting, fever, chest pain, palpitations, shortness of breath, orthopnea, PND, dizziness, syncope, cough, congestion, abdominal pain, hematochezia, melena, lower extremity edema. EKG showed sinus rhythm with RBBB. No prior EKG to compare. K of 3.4. Hemoglobin of 17.    He is smoking 1 pack a day for the past 43 years.   Past Medical History  Diagnosis Date  . Myocardial infarct (HCC)   . High cholesterol   . Arthritis   . Seasonal allergies   . Hypertension   . Urine incontinence   . Diabetes mellitus Jan. 1999  . Diabetes [facility])      Past Surgical History  Procedure Laterality Date  . Coronary stent placement  Jan.  1999    Allergies  Allergen Reactions  . Aspirin Other (See Comments)    Stomach upset  . Oxycodone-Acetaminophen Itching  . Latex Rash    I have reviewed the patient's current medications   . sodium chloride 100 mL/hr at 01/01/16 1104     Prior to Admission medications   Medication Sig Start Date End Date  Taking? Authorizing Provider  acetaminophen (TYLENOL) 650 MG CR tablet Take 1,300 mg by mouth 2 (two) times daily.   Yes Historical Provider, MD  carvedilol (COREG) 3.125 MG tablet Take 1 tablet (3.125 mg total) by mouth 2 (two) times daily with a meal. 11/10/12  Yes Terressa Koyanagi, DO  collagenase (SANTYL) ointment Apply 1 application topically daily. 12/21/15  Yes Vivi Barrack, DPM  cyclobenzaprine (FLEXERIL) 10 MG tablet Take 10 mg by mouth at bedtime.   Yes Historical Provider, MD  gabapentin (NEURONTIN) 300 MG capsule Take 900 mg by mouth 3 (three) times daily.  11/13/15  Yes Historical Provider, MD  glimepiride (AMARYL) 4 MG tablet Take 4 mg by mouth 2 (two) times daily.   Yes Historical Provider, MD  lisinopril (PRINIVIL,ZESTRIL) 40 MG tablet Take 1 tablet (40 mg total) by mouth daily. 10/30/12  Yes Terressa Koyanagi, DO  metFORMIN (GLUCOPHAGE) 1000 MG tablet Take 1 tablet (1,000 mg total) by mouth 2 (two) times daily with a meal. 10/30/12  Yes Terressa Koyanagi, DO  PRESCRIPTION MEDICATION Inhale into the lungs at bedtime. CPAP   Yes Historical Provider, MD  traMADol (ULTRAM) 50 MG tablet Take 1 tablet (50 mg total) by mouth every 12 (twelve) hours as needed. Patient taking differently: Take 50-100 mg by mouth every 12 (twelve) hours as needed (pain).  12/05/15  Yes Vivi Barrack, DPM  glucose blood (  FREESTYLE LITE) test strip Use as instructed 11/10/12   Terressa Koyanagi, DO  glucose blood test strip Use as instructed 10/30/12   Terressa Koyanagi, DO  Lancets (FREESTYLE) lancets Use as instructed 10/30/12   Terressa Koyanagi, DO  Lancets (FREESTYLE) lancets Use as instructed 11/10/12   Terressa Koyanagi, DO  pravastatin (PRAVACHOL) 40 MG tablet Take 1 tablet (40 mg total) by mouth daily. Patient not taking: Reported on 12/29/2015 10/30/12   Terressa Koyanagi, DO     Social History   Social History  . Marital Status: Divorced    Spouse Name: N/A  . Number of Children: N/A  . Years of Education: N/A   Occupational  History  . Not on file.   Social History Main Topics  . Smoking status: Current Every Day Smoker -- 1.00 packs/day    Types: Cigarettes  . Smokeless tobacco: Never Used  . Alcohol Use: No  . Drug Use: No  . Sexual Activity: Not on file   Other Topics Concern  . Not on file   Social History Narrative    No family status information on file.   Family History  Problem Relation Age of Onset  . Arthritis Mother   . Heart disease Maternal Grandmother   . Hypertension Mother   . Mental illness Maternal Grandmother        ROS:  Full 14 point review of systems complete and found to be negative unless listed above.  Physical Exam: Blood pressure 181/106, pulse 0, temperature 98.9 F (37.2 C), temperature source Oral, resp. rate 58, height  (1.702 m), weight 205 lb (92.987 kg), SpO2 0 %.  General: Well developed, well nourished, male in no acute distress Head: Eyes PERRLA, No xanthomas. Normocephalic and atraumatic, oropharynx without edema or exudate.  Lungs: Resp regular and unlabored, CTA. Heart: RRR no s3, s4, or murmurs..   Neck: No carotid bruits. No lymphadenopathy.  JVD. Abdomen: Bowel sounds present, abdomen soft and non-tender without masses or hernias noted. Msk:  No spine or cva tenderness. No weakness, no joint deformities or effusions. Extremities: No clubbing, cyanosis or edema. DP 1+ bilterally. L foot with dressing.  Neuro: Alert and oriented X 3. No focal deficits noted. Psych:  Good affect, responds appropriately Skin: No rashes or lesions noted.  Labs:   Lab Results  Component Value Date   WBC 9.4 08/07/2012   HGB 17.0 01/01/2016   HCT 50.0 01/01/2016   MCV 85.3 08/07/2012   PLT 259 08/07/2012   No results for input(s): INR in the last 72 hours.  Recent Labs Lab 01/01/16 1111  NA 138  K 3.4*  CL 97*  BUN 5*  CREATININE 0.80  GLUCOSE 120*   No results found for: MG No results for input(s): CKTOTAL, CKMB, TROPONINI in the last 72  hours. No results for input(s): TROPIPOC in the last 72 hours. No results found for: PROBNP Lab Results  Component Value Date   CHOL 236* 10/30/2012   HDL 34.30* 10/30/2012   TRIG * 10/30/2012    602.0 Triglyceride is over 400; calculations on Lipids are invalid.    ECG: Vent. rate 91 BPM PR interval 156 ms QRS duration 138 ms QT/QTc 386/474 ms P-R-T axes 35 -12 -45  Radiology:  No results found.  ASSESSMENT AND PLAN:     1. CAD - s/p bare metal stent to the right coronary artery and had nonobstructive disease in his LAD. No exertional chest pain or SOB. -  Continue coreg, lisinopril and statin . Intolerance to aspirin and previously taken plavix.  2. HL - Last lipid profile 10/2012. Consider repeat.   3. Atherosclerosis of native arteries of the extremities with gangrene (HCC) - Pending result of arteriogram. Speaking with nurse find out that the patient is s/p Left popliteal PTCA & DES.   4. HTN - BP running high, likely as he hasn't received home meds today. Consider resuming.  SignedManson Passey, PA 01/01/2016, 4:08 PM Pager [phone]  Co-Sign MD  I have seen, examined and evaluated the patient this PM along with Mr. Tempie Hoist.  After reviewing all the available data and chart,  I agree with his findings, examination as well as impression recommendations.  Rambert is a very pleasant gentleman with a history of coronary disease, hypertension and hyperlipidemia as well as diabetes but not on insulin who presented with a diabetic left foot ulcer. He underwent percutaneous endovascular procedure today following peripheral angiography, but apparently has potential surgical procedure for Wednesday schedule. We are consulted for preoperative risk assessment this gentleman who has not seen a cardiologist for years.  He basically had his last stress test about 6 years ago that was negative for ischemia. He is remaining active with routine exercise and ambulation up until  about the last few months when he's not been able to resist foot. He is not had any resting or exertional anginal pain or dyspnea.  He is essentially asymptomatic, and quite active. Relatively normal exam.  -- He is not currently on a statin. Strongly encourage starting atorvastatin or rosuvastatin (discharge at least 40 mg atorvastatin/20 mg rosuvastatin. He is on a beta blocker, albeit low along with an ACE inhibitor.  Overall with no prior cerebrovascular disease, no CHF symptoms, revascularized CAD with no active symptoms, diabetes without insulin, normal renal function, and relatively (surgically) low risk procedure, the patient will be a low risk patient for this procedure and would not warrant any additional cardiac evaluation prior to proceeding. He is a Conservator, museum/gallery, therefore performing a stress test to evaluate for ischemia is unwarranted as we would not otherwise consider taking him to the cath lab for stenting.  As he does have PAD and CAD, continue aggressive risk factor modification -- needs to be on a statin," glycemic control and blood pressure control.  Close glycemic control as displayed logical and recuperation from surgery.    based on [facility] guidelines/recommendations, she would be a relatively LOW RISK patient for a LOW  Operation. -- Besides echocardiographic evaluation of ejection fraction EDP -- would continue diuretic, and consider even an IV dose. He he is on carvedilol and lisinopril with borderline blood pressure control.-- increasing Coreg to 6.25 mg bid   Marykay Lex, M.D., M.S. Interventional Cardiologist   Pager # [phone] Phone # [phone] [address] [place], [place] [address]

## 2016-01-01 NOTE — H&P (View-Only) (Signed)
Referred by:  Johny Blamer, MD [id number] [address]. [address] [place], [place] [address]  Reason for referral: left heel and dorsum ulcer  History of Present Illness  Clinton Harrington is a 65 y.o. ([birth year]) male who presents with chief complaint: painful left foot.  Onset of symptom occurred two months ago, heel ulcer followed by ulcer on dorsum of foot.  These wounds are associated with intermittent pain in the left fot.  Pain is described as sharp, severity 5-10/10, and associated with manipulating the left foot.  Patient has attempted to treat this pain with rest.  The patient has intermittent rest pain symptoms.  Atherosclerotic risk factors include: HLD, HTN, IDDM, and active smoking.  Past Medical History  Diagnosis Date  . Myocardial infarct (HCC)   . High cholesterol   . Arthritis   . Seasonal allergies   . Hypertension   . Urine incontinence   . Diabetes mellitus Jan. 1999  . Diabetes [facility])     Past Surgical History  Procedure Laterality Date  . Coronary stent placement  Jan.  1999    Social History   Social History  . Marital Status: Divorced    Spouse Name: N/A  . Number of Children: N/A  . Years of Education: N/A   Occupational History  . Not on file.   Social History Main Topics  . Smoking status: Current Every Day Smoker -- 1.00 packs/day    Types: Cigarettes  . Smokeless tobacco: Never Used  . Alcohol Use: No  . Drug Use: No  . Sexual Activity: Not on file   Other Topics Concern  . Not on file   Social History Narrative    Family History  Problem Relation Age of Onset  . Arthritis Mother   . Heart disease Maternal Grandmother   . Hypertension Mother   . Mental illness Maternal Grandmother     Current Outpatient Prescriptions  Medication Sig Dispense Refill  . carvedilol (COREG) 3.125 MG tablet Take 1 tablet (3.125 mg total) by mouth 2 (two) times daily with a meal. 60 tablet 3  . collagenase (SANTYL) ointment Apply 1 application  topically daily. 15 g 0  . fluconazole (DIFLUCAN) 150 MG tablet   0  . gabapentin (NEURONTIN) 300 MG capsule   0  . glimepiride (AMARYL) 4 MG tablet 1 tablet    . glucose blood (FREESTYLE LITE) test strip Use as instructed 100 each 12  . glucose blood test strip Use as instructed 100 each 12  . insulin glargine (LANTUS) 100 UNIT/ML injection Inject 10 Units into the skin daily.    . Lancets (FREESTYLE) lancets Use as instructed 100 each 12  . Lancets (FREESTYLE) lancets Use as instructed 100 each 12  . lisinopril (PRINIVIL,ZESTRIL) 40 MG tablet Take 1 tablet (40 mg total) by mouth daily. 90 tablet 3  . metFORMIN (GLUCOPHAGE) 1000 MG tablet Take 1 tablet (1,000 mg total) by mouth 2 (two) times daily with a meal. 180 tablet 3  . pravastatin (PRAVACHOL) 40 MG tablet Take 1 tablet (40 mg total) by mouth daily. 90 tablet 3  . traMADol (ULTRAM) 50 MG tablet   0  . traMADol (ULTRAM) 50 MG tablet Take 1 tablet (50 mg total) by mouth every 12 (twelve) hours as needed. 30 tablet 0  . CHANTIX STARTING MONTH PAK 0.5 MG X 11 & 1 MG X 42 tablet     . doxycycline (VIBRA-TABS) 100 MG tablet     . HYDROcodone-acetaminophen (NORCO/VICODIN)  5-325 MG tablet     . traZODone (DESYREL) 50 MG tablet      No current facility-administered medications for this visit.    Allergies  Allergen Reactions  . Aspirin Other (See Comments)    Stomach upset  . Oxycodone-Acetaminophen Itching  . Latex Rash     REVIEW OF SYSTEMS:  (Positives checked otherwise negative)  CARDIOVASCULAR:    chest pain,   chest pressure,   palpitations,   shortness of breath when laying flat,   shortness of breath with exertion,    pain in feet when walking,   pain in feet when laying flat,  history of blood clot in veins (DVT),   history of phlebitis,   swelling in legs,   varicose veins  PULMONARY:    productive cough,   asthma,   wheezing  NEUROLOGIC:    weakness in arms or  legs,   numbness in arms or legs,   difficulty speaking or slurred speech,   temporary loss of vision in one eye,   dizziness  HEMATOLOGIC:    bleeding problems,   problems with blood clotting too easily  MUSCULOSKEL:    joint pain,  joint swelling  GASTROINTEST:    vomiting blood,   blood in stool     GENITOURINARY:    burning with urination,   blood in urine  PSYCHIATRIC:    history of major depression  INTEGUMENTARY:    rashes,   ulcers  CONSTITUTIONAL:    fever,   chills   For VQI Use Only  PRE-ADM LIVING: Home  AMB STATUS: Ambulatory  CAD Sx: None  PRIOR CHF: None  STRESS TEST:  No,  Normal,  + ischemia,  + MI,  Both   Physical Examination  Filed Vitals:   12/29/15 1054 12/29/15 1101  BP: 172/99 178/105  Pulse: 88 87  Temp: 98.3 F (36.8 C)   TempSrc: Oral   Resp: 16   Height:  (1.702 m)   Weight: 205 lb (92.987 kg)   SpO2: 97%    Body mass index is 32.1 kg/(m^2).  General: A&O x 3, WD, mildly obese,   Head: [place]/AT  Ear/Nose/Throat: Hearing grossly intact, nares w/o erythema or drainage, oropharynx w/o Erythema/Exudate, Mallampati score: 3  Eyes: PERRLA, EOMI  Neck: Supple, no nuchal rigidity, no palpable LAD  Pulmonary: Sym exp, good air movt, CTAB, no rales, rhonchi, & wheezing  Cardiac: RRR, Nl S1, S2, no Murmurs, rubs or gallops  Vascular: Vessel Right Left  Radial Palpable Palpable  Brachial Palpable Palpable  Carotid Palpable, without bruit Palpable, without bruit  Aorta Not palpable N/A  Femoral Palpable Palpable  Popliteal Not palpable Not palpable  PT Palpable Not Palpable  DP Palpable Not Palpable   Gastrointestinal: soft, NTND, -G/R, - HSM, - masses, - CVAT B  Musculoskeletal: M/S 5/5 throughout , RLE without ischemic changes, L heel dry gangrene (black eschar), dorsal ulcer with exposed fat overlying proximal MT  Neurologic: CN 2-12 intact  , Pain and light touch intact in extremities except decreased in R>L, Motor exam as listed above  Psychiatric: Judgment intact, Mood & affect appropriate for pt's clinical situation  Dermatologic: See M/S exam for extremity exam, no rashes otherwise noted  Lymph : No Cervical, Axillary, or Inguinal lymphadenopathy    Non-Invasive  Vascular Imaging  ABI (Date: 12/29/2015)  R: 1.01 (1.04), DP: tri, PT: tri, TBI: 0.64  L: 0.56 (0.51), DP: mono, PT: mono, TBI: 0.12  ABI likely falsely elevated so the TBI 0.12 more accurately reflects the severely limited perfusion of the L leg.   LLE arterial duplex (12/29/2015)  Triphasic down to distal SFA  Monophasic distally in all vessels   Outside Studies/Documentation 6 pages of outside documents were reviewed including: outside non-invasive studies, Podiatry chart.   Medical Decision Making  Clinton Harrington is a 65 y.o. male who presents with: LLE critical limb ischemia, possible L SFA stenosis, diabetes with complications   I discussed with the patient the natural history of critical limb ischemia: 25% require amputation in one year, 50% are able to maintain their limbs in one year, and 25-30% die in one year due to comorbidities.  Given the limb threatening status of this patient, I recommend an aggressive work up including proceeding with an: Aortogram, Bilateral runoff and possible intervention LLE I discussed with the patient the nature of angiographic procedures, especially the limited patencies of any endovascular intervention. The patient is aware of that the risks of an angiographic procedure include but are not limited to: bleeding, infection, access site complications, embolization, rupture of treated vessel, dissection, possible need for emergent surgical intervention, and possible need for surgical procedures to treat the patient's pathology. The patient is aware of the risks and agrees to proceed.  The procedure is scheduled  for: 23 JAN 17. If he is going to require surgical intervention, will plan on admitting post-procedure to expedite Cardiology work-up for a possible Wednesday operation.  I discussed in depth with the patient the nature of atherosclerosis, and emphasized the importance of maximal medical management including strict control of blood pressure, blood glucose, and lipid levels, antiplatelet agents, obtaining regular exercise, and cessation of smoking.  The patient is aware that without maximal medical management the underlying atherosclerotic disease process will progress, limiting the benefit of any interventions. The patient is currently on a statin:  Pravachol. The patient is currently not on an anti-platelet.  He will be started on one post-procedure, likely Plavix.  Thank you for allowing [name] to participate in this patient's care.   Leonides Sake, MD [facility] Office: [phone] Pager: [phone]  12/29/2015, 12:57 PM

## 2016-01-01 NOTE — Progress Notes (Signed)
Site area: right groin a 6 french arterial sheath was removed  Site Prior to Removal:  Level 0  Pressure Applied For 15 MINUTES    Minutes Beginning at 1850p  Manual:   Yes.    Patient Status During Pull:  stable  Post Pull Groin Site:  Level 0  Post Pull Instructions Given:  Yes.    Post Pull Pulses Present:  Yes.    Dressing Applied:  Yes.    Comments:  VS remain stable during sheath pull.  Pt medicated for foot pain a 9 out of 10.  Pain med Effective  Pain level at 45t

## 2016-01-01 NOTE — Interval H&P Note (Signed)
History and Physical Interval Note:  01/01/2016 2:03 PM  Clinton Harrington  has presented today for surgery, with the diagnosis of pvd with left foot grengreen  The various methods of treatment have been discussed with the patient and family. After consideration of risks, benefits and other options for treatment, the patient has consented to  Procedure(s): Abdominal Aortogram w/Lower Extremity (N/A) as a surgical intervention .  The patient's history has been reviewed, patient examined, no change in status, stable for surgery.  I have reviewed the patient's chart and labs.  Questions were answered to the patient's satisfaction.     Leonides Sake

## 2016-01-01 NOTE — Progress Notes (Signed)
Patient received from Cath Lab alert and oriented into 2w09. On arrival vitals checked were stable. Placed on cardiac monitor and safety measures put in placed. Right groin checked on arrival which is level 0 as at now. On bedrest until 11pm. Patient made comfortable and will keep monitoring.

## 2016-01-02 ENCOUNTER — Inpatient Hospital Stay (HOSPITAL_COMMUNITY): Payer: Medicare Other

## 2016-01-02 ENCOUNTER — Encounter (HOSPITAL_COMMUNITY): Payer: Self-pay | Admitting: Vascular Surgery

## 2016-01-02 DIAGNOSIS — Z01810 Encounter for preprocedural cardiovascular examination: Secondary | ICD-10-CM

## 2016-01-02 LAB — GLUCOSE, CAPILLARY
Glucose-Capillary: 126 mg/dL — ABNORMAL HIGH (ref 65–99)
Glucose-Capillary: 177 mg/dL — ABNORMAL HIGH (ref 65–99)
Glucose-Capillary: 148 mg/dL — ABNORMAL HIGH (ref 65–99)
Glucose-Capillary: 155 mg/dL — ABNORMAL HIGH (ref 65–99)

## 2016-01-02 LAB — CBC
HEMATOCRIT: 42.1 % (ref 39.0–52.0)
Hemoglobin: 14.1 g/dL (ref 13.0–17.0)
MCH: 27.9 pg (ref 26.0–34.0)
MCHC: 33.5 g/dL (ref 30.0–36.0)
MCV: 83.2 fL (ref 78.0–100.0)
Platelets: 233 10*3/uL (ref 150–400)
RBC: 5.06 MIL/uL (ref 4.22–5.81)
RDW: 14.3 % (ref 11.5–15.5)
WBC: 9.2 10*3/uL (ref 4.0–10.5)

## 2016-01-02 LAB — BASIC METABOLIC PANEL
Anion gap: 11 (ref 5–15)
BUN: <5 mg/dL — ABNORMAL LOW (ref 6–20)
CALCIUM: 9 mg/dL (ref 8.9–10.3)
CO2: 26 mmol/L (ref 22–32)
CREATININE: 0.81 mg/dL (ref 0.61–1.24)
Chloride: 101 mmol/L (ref 101–111)
GFR calc Af Amer: >60 mL/min (ref >60)
GFR calc non Af Amer: >60 mL/min (ref >60)
GLUCOSE: 117 mg/dL — AB (ref 65–99)
Potassium: 3.3 mmol/L — ABNORMAL LOW (ref 3.5–5.1)
Sodium: 138 mmol/L (ref 135–145)

## 2016-01-02 LAB — POCT ACTIVATED CLOTTING TIME
Activated Clotting Time: 209 seconds
Activated Clotting Time: 183 seconds

## 2016-01-02 MED ORDER — TRAMADOL HCL 50 MG PO TABS: 50.0000 mg | ORAL_TABLET | Freq: Four times a day (QID) | ORAL | Status: DC | PRN

## 2016-01-02 MED ORDER — CEFAZOLIN SODIUM 1-5 GM-% IV SOLN
1.0000 g | Freq: Three times a day (TID) | INTRAVENOUS | Status: DC
  Administered 2016-01-02 – 2016-01-05 (×9): 1 g via INTRAVENOUS
  Filled 2016-01-02 (×15): qty 50

## 2016-01-02 MED ORDER — CEFAZOLIN SODIUM 1 G IJ SOLR
1.0000 g | Freq: Three times a day (TID) | INTRAMUSCULAR | Status: DC
  Filled 2016-01-02 (×3): qty 10

## 2016-01-02 MED ORDER — METFORMIN HCL 1000 MG PO TABS: 1000.0000 mg | ORAL_TABLET | Freq: Two times a day (BID) | ORAL | Status: DC

## 2016-01-02 MED ORDER — CLOPIDOGREL BISULFATE 75 MG PO TABS
75.0000 mg | ORAL_TABLET | Freq: Every day | ORAL | Status: DC
  Administered 2016-01-02: 75 mg via ORAL
  Filled 2016-01-02: qty 1

## 2016-01-02 MED ORDER — COLLAGENASE 250 UNIT/GM EX OINT
TOPICAL_OINTMENT | Freq: Every day | CUTANEOUS | Status: DC
  Administered 2016-01-02: 1 via TOPICAL
  Administered 2016-01-04 – 2016-01-05 (×2): via TOPICAL
  Filled 2016-01-02 (×2): qty 30

## 2016-01-02 MED ORDER — CARVEDILOL 6.25 MG PO TABS
6.2500 mg | ORAL_TABLET | Freq: Two times a day (BID) | ORAL | Status: DC
  Administered 2016-01-02 – 2016-01-05 (×6): 6.25 mg via ORAL
  Filled 2016-01-02 (×6): qty 1

## 2016-01-02 MED ORDER — DEXTROSE 5 % IV SOLN: 1.5000 g | INTRAVENOUS | Status: DC

## 2016-01-02 MED FILL — Heparin Sodium (Porcine) Inj 1000 Unit/ML: INTRAMUSCULAR | Qty: 10 | Status: AC

## 2016-01-02 MED FILL — Hydralazine HCl Inj 20 MG/ML: INTRAMUSCULAR | Qty: 1 | Status: AC

## 2016-01-02 NOTE — Progress Notes (Signed)
VASCULAR LAB PRELIMINARY  PRELIMINARY  PRELIMINARY  PRELIMINARY  Right Lower Extremity Vein Map    Right Great Saphenous Vein   Segment Diameter Comment  1. Origin 7.8 mm   2. High Thigh 4.0 mm Branch between proximal ad mid thigh  3. Mid Thigh 4.1 mm   4. Low Thigh 3.9 mm   5. At Knee 3.3 mm   6. High Calf 2.8 mm   7. Low Calf 2.4 mm   8. Ankle 2.2 mm Branch   Greater saphenous vein runs medial from the high thigh to knee   Left Lower Extremity Vein Map    Left Great Saphenous Vein   Segment Diameter Comment  1. Origin 6.2 mm   2. High Thigh 4.7 mm   3. Mid Thigh 4.0 mm Branch between mid thigh and lower thigh  4. Low Thigh 4.6 mm   5. At Knee 4.7 mm   6. High Calf 3.6 mm Branch between proximal and mid calf  7. Low Calf 3.3 mm   8. Ankle 3.3 mm         Clinton Harrington, RVS 01/02/2016, 12:30 PM

## 2016-01-02 NOTE — Progress Notes (Signed)
   Daily Progress Note  Assessment/Planning: POD #1 s/p PTA L SFA for L heel gangrene, L dorsal ulcer   ABI nearly identical from preop. Unclear exact reason given resolution of near occlusion of L AK pop artery stenosis.   Given pt has limb threatening wounds, would proceed with L CFA to BK popliteal bypass to try to salvage this L foot  The risk, benefits, and alternative for bypass operations were discussed with the patient. The patient is aware the risks include but are not limited to: bleeding, infection, myocardial infarction, stroke, limb loss, nerve damage, need for additional procedures in the future, wound complications, and inability to complete the bypass.   I had emphasized to this patient that he is already at high risk for limb loss given the heel gangrene, but the patient is unwilling to proceed with an amputation.  The patient is aware of these risks and agreed to proceed.  In regards to his L foot wounds, case already d/w Dr. Duda (gone for rest of week), appt already arranged for this coming week as outpatient  Will add an empiric antibiotic given odor from left foot   Subjective - 1 Day Post-Op  No significant changes  Objective Filed Vitals:   01/02/16 0350 01/02/16 0747 01/02/16 0959 01/02/16 1146  BP: 104/67 103/72 96/52 115/69  Pulse: 99 104  113  Temp:  99 F (37.2 C)  98.6 F (37 C)  TempSrc:  Oral  Oral  Resp: 19 17  18  Height:      Weight:      SpO2: 93% 92%  96%    Intake/Output Summary (Last 24 hours) at 01/02/16 1635 Last data filed at 01/02/16 1348  Gross per 24 hour  Intake 971.67 ml  Output  1816 ml  Net -844.33 ml    PULM CTAB CV RRR GI soft, NTND VASC L foot appears viable, warm foot without palpable pulses, L heel dry gangrene (not fully thickness), dry dorsal ulcer, +odor, no drainage   Laboratory CBC  Labs (Brief)       Component Value  Date/Time   WBC 10.3 01/01/2016 2358   HGB 10.1* 01/01/2016 2358   HCT 30.8* 01/01/2016 2358   PLT 144* 01/01/2016 2358      BMET  Labs (Brief)       Component Value Date/Time   NA 138 01/01/2016 2358   K 3.9 01/01/2016 2358   CL 101 01/01/2016 2358   CO2 26 01/01/2016 2358   GLUCOSE 160* 01/01/2016 2358   BUN 10 01/01/2016 2358   CREATININE 0.88 01/01/2016 2358   CREATININE 0.70 10/20/2015 1031   CALCIUM 8.6* 01/01/2016 2358   GFRNONAA >60 01/01/2016 2358   GFRAA >60 01/01/2016 2358      Kamrynn Melott, MD, FACS [facility] Office: [phone] Pager: [phone]  01/02/2016, 4:46 PM   

## 2016-01-02 NOTE — Progress Notes (Signed)
VASCULAR LAB PRELIMINARY  ARTERIAL  ABI completed:    RIGHT    LEFT    PRESSURE WAVEFORM  PRESSURE WAVEFORM  BRACHIAL 153 Triphasic BRACHIAL 159 Triphasic  DP 144 Triphasic DP 88 Biphasic  PT 116 Triphasic PT 69 Monophasic    RIGHT LEFT  ABI 0.91 0.55   Right ABI indicates a mild reduction in arterial flow with normal Doppler waveforms at rest. Left ABI indicate a moderate reduction in arterial flow with abnormal Doppler waveforms at rest.   Newel Oien, RVS 01/02/2016, 12:20 PM

## 2016-01-02 NOTE — Progress Notes (Signed)
UR Completed. Liliah Dorian, RN, BSN.  [phone] 

## 2016-01-03 ENCOUNTER — Encounter (HOSPITAL_COMMUNITY)
Admission: AD | Disposition: A | Discharge status: Home Health | Payer: Self-pay | Source: Ambulatory Visit | Attending: Vascular Surgery

## 2016-01-03 ENCOUNTER — Inpatient Hospital Stay (HOSPITAL_COMMUNITY): Payer: Medicare Other | Admitting: Certified Registered Nurse Anesthetist

## 2016-01-03 ENCOUNTER — Inpatient Hospital Stay (HOSPITAL_COMMUNITY): Payer: Medicare Other

## 2016-01-03 ENCOUNTER — Encounter (HOSPITAL_COMMUNITY): Payer: Self-pay | Admitting: Vascular Surgery

## 2016-01-03 HISTORY — PX: INTRAOPERATIVE ARTERIOGRAM: SHX5157

## 2016-01-03 HISTORY — PX: FEMORAL-POPLITEAL BYPASS GRAFT: SHX937

## 2016-01-03 LAB — GLUCOSE, CAPILLARY
GLUCOSE-CAPILLARY: 148 mg/dL — AB (ref 65–99)
GLUCOSE-CAPILLARY: 128 mg/dL — AB (ref 65–99)
GLUCOSE-CAPILLARY: 134 mg/dL — AB (ref 65–99)
GLUCOSE-CAPILLARY: 140 mg/dL — AB (ref 65–99)

## 2016-01-03 LAB — CBC
HCT: 40.8 % (ref 39.0–52.0)
Hemoglobin: 13.8 g/dL (ref 13.0–17.0)
MCH: 28.2 pg (ref 26.0–34.0)
MCHC: 33.8 g/dL (ref 30.0–36.0)
MCV: 83.3 fL (ref 78.0–100.0)
PLATELETS: 233 10*3/uL (ref 150–400)
RBC: 4.9 MIL/uL (ref 4.22–5.81)
RDW: 14.1 % (ref 11.5–15.5)
WBC: 9 10*3/uL (ref 4.0–10.5)

## 2016-01-03 LAB — BASIC METABOLIC PANEL
Anion gap: 15 (ref 5–15)
CALCIUM: 8.5 mg/dL — AB (ref 8.9–10.3)
CO2: 22 mmol/L (ref 22–32)
CREATININE: 0.82 mg/dL (ref 0.61–1.24)
Chloride: 93 mmol/L — ABNORMAL LOW (ref 101–111)
GFR calc non Af Amer: >60 mL/min (ref >60)
Glucose, Bld: 125 mg/dL — ABNORMAL HIGH (ref 65–99)
Potassium: 2.9 mmol/L — ABNORMAL LOW (ref 3.5–5.1)
SODIUM: 130 mmol/L — AB (ref 135–145)

## 2016-01-03 LAB — HEMOGLOBIN A1C
HEMOGLOBIN A1C: 7.6 % — AB (ref 4.8–5.6)
Mean Plasma Glucose: 171 mg/dL

## 2016-01-03 LAB — SURGICAL PCR SCREEN
MRSA, PCR: NEGATIVE
Staphylococcus aureus: POSITIVE — AB

## 2016-01-03 SURGERY — BYPASS GRAFT FEMORAL-POPLITEAL ARTERY
Anesthesia: General | Site: Leg Upper | Laterality: Left

## 2016-01-03 MED ORDER — METOPROLOL TARTRATE 1 MG/ML IV SOLN
2.5000 mg | INTRAVENOUS | Status: DC | PRN
  Administered 2016-01-03: 2.5 mg via INTRAVENOUS

## 2016-01-03 MED ORDER — ENOXAPARIN SODIUM 40 MG/0.4ML ~~LOC~~ SOLN
40.0000 mg | SUBCUTANEOUS | Status: DC
  Administered 2016-01-04 – 2016-01-05 (×2): 40 mg via SUBCUTANEOUS
  Filled 2016-01-03 (×2): qty 0.4

## 2016-01-03 MED ORDER — FENTANYL CITRATE (PF) 100 MCG/2ML IJ SOLN
INTRAMUSCULAR | Status: DC | PRN
  Administered 2016-01-03: 25 ug via INTRAVENOUS
  Administered 2016-01-03 (×2): 50 ug via INTRAVENOUS
  Administered 2016-01-03: 25 ug via INTRAVENOUS
  Administered 2016-01-03: 100 ug via INTRAVENOUS
  Administered 2016-01-03 (×2): 25 ug via INTRAVENOUS
  Administered 2016-01-03: 150 ug via INTRAVENOUS
  Administered 2016-01-03: 50 ug via INTRAVENOUS

## 2016-01-03 MED ORDER — SUCCINYLCHOLINE CHLORIDE 20 MG/ML IJ SOLN
INTRAMUSCULAR | Status: DC | PRN
  Administered 2016-01-03: 120 mg via INTRAVENOUS

## 2016-01-03 MED ORDER — FENTANYL CITRATE (PF) 250 MCG/5ML IJ SOLN
INTRAMUSCULAR | Status: AC
  Filled 2016-01-03: qty 5

## 2016-01-03 MED ORDER — POTASSIUM CHLORIDE CRYS ER 20 MEQ PO TBCR
20.0000 meq | EXTENDED_RELEASE_TABLET | Freq: Every day | ORAL | Status: DC | PRN
Start: 2016-01-03 — End: 2016-01-05

## 2016-01-03 MED ORDER — PROMETHAZINE HCL 25 MG/ML IJ SOLN
INTRAMUSCULAR | Status: AC
  Filled 2016-01-03: qty 1

## 2016-01-03 MED ORDER — HYDRALAZINE HCL 20 MG/ML IJ SOLN
INTRAMUSCULAR | Status: AC
  Filled 2016-01-03: qty 1

## 2016-01-03 MED ORDER — MAGNESIUM SULFATE 2 GM/50ML IV SOLN
2.0000 g | Freq: Every day | INTRAVENOUS | Status: DC | PRN
  Filled 2016-01-03: qty 50

## 2016-01-03 MED ORDER — SUGAMMADEX SODIUM 200 MG/2ML IV SOLN
INTRAVENOUS | Status: DC | PRN
  Administered 2016-01-03: 176 mg via INTRAVENOUS

## 2016-01-03 MED ORDER — ROCURONIUM BROMIDE 50 MG/5ML IV SOLN
INTRAVENOUS | Status: AC
  Filled 2016-01-03: qty 1

## 2016-01-03 MED ORDER — LIDOCAINE HCL (CARDIAC) 20 MG/ML IV SOLN
INTRAVENOUS | Status: DC | PRN
  Administered 2016-01-03: 80 mg via INTRAVENOUS

## 2016-01-03 MED ORDER — MIDAZOLAM HCL 5 MG/5ML IJ SOLN
INTRAMUSCULAR | Status: DC | PRN
  Administered 2016-01-03: 2 mg via INTRAVENOUS

## 2016-01-03 MED ORDER — PROPOFOL 10 MG/ML IV BOLUS
INTRAVENOUS | Status: DC | PRN
  Administered 2016-01-03: 150 mg via INTRAVENOUS

## 2016-01-03 MED ORDER — CLOPIDOGREL BISULFATE 75 MG PO TABS
75.0000 mg | ORAL_TABLET | Freq: Every day | ORAL | Status: DC
  Administered 2016-01-04 – 2016-01-05 (×2): 75 mg via ORAL
  Filled 2016-01-03 (×2): qty 1

## 2016-01-03 MED ORDER — SODIUM CHLORIDE 0.9 % IV SOLN: 500.0000 mL | Freq: Once | INTRAVENOUS | Status: DC | PRN

## 2016-01-03 MED ORDER — HEPARIN SODIUM (PORCINE) 1000 UNIT/ML IJ SOLN
INTRAMUSCULAR | Status: DC | PRN
  Administered 2016-01-03: 6 mL via INTRAVENOUS

## 2016-01-03 MED ORDER — PROTAMINE SULFATE 10 MG/ML IV SOLN
INTRAVENOUS | Status: AC
  Filled 2016-01-03: qty 5

## 2016-01-03 MED ORDER — LACTATED RINGERS IV SOLN
INTRAVENOUS | Status: DC | PRN
  Administered 2016-01-03: 12:00:00 via INTRAVENOUS

## 2016-01-03 MED ORDER — FENTANYL CITRATE (PF) 100 MCG/2ML IJ SOLN: 25.0000 ug | INTRAMUSCULAR | Status: DC | PRN

## 2016-01-03 MED ORDER — PRAVASTATIN SODIUM 40 MG PO TABS
40.0000 mg | ORAL_TABLET | Freq: Every day | ORAL | Status: DC
  Administered 2016-01-04: 40 mg via ORAL
  Filled 2016-01-03: qty 1

## 2016-01-03 MED ORDER — HYDRALAZINE HCL 20 MG/ML IJ SOLN
5.0000 mg | INTRAMUSCULAR | Status: DC | PRN
  Administered 2016-01-03: 5 mg via INTRAVENOUS

## 2016-01-03 MED ORDER — PHENYLEPHRINE 40 MCG/ML (10ML) SYRINGE FOR IV PUSH (FOR BLOOD PRESSURE SUPPORT)
PREFILLED_SYRINGE | INTRAVENOUS | Status: AC
  Filled 2016-01-03: qty 20

## 2016-01-03 MED ORDER — PROTAMINE SULFATE 10 MG/ML IV SOLN
INTRAVENOUS | Status: DC | PRN
  Administered 2016-01-03 (×2): 25 mg via INTRAVENOUS

## 2016-01-03 MED ORDER — SODIUM CHLORIDE 0.9 % IV SOLN
INTRAVENOUS | Status: DC | PRN
  Administered 2016-01-03: 500 mL

## 2016-01-03 MED ORDER — PHENOL 1.4 % MT LIQD: 1.0000 | OROMUCOSAL | Status: DC | PRN

## 2016-01-03 MED ORDER — 0.9 % SODIUM CHLORIDE (POUR BTL) OPTIME
TOPICAL | Status: DC | PRN
  Administered 2016-01-03: 1000 mL

## 2016-01-03 MED ORDER — LACTATED RINGERS IV SOLN
INTRAVENOUS | Status: DC
  Administered 2016-01-03 (×2): via INTRAVENOUS

## 2016-01-03 MED ORDER — PHENYLEPHRINE HCL 10 MG/ML IJ SOLN
INTRAMUSCULAR | Status: DC | PRN
  Administered 2016-01-03 (×3): 80 ug via INTRAVENOUS
  Administered 2016-01-03: 40 ug via INTRAVENOUS
  Administered 2016-01-03: 80 ug via INTRAVENOUS

## 2016-01-03 MED ORDER — SUGAMMADEX SODIUM 200 MG/2ML IV SOLN
INTRAVENOUS | Status: AC
  Filled 2016-01-03: qty 2

## 2016-01-03 MED ORDER — SUCCINYLCHOLINE CHLORIDE 20 MG/ML IJ SOLN
INTRAMUSCULAR | Status: AC
  Filled 2016-01-03: qty 1

## 2016-01-03 MED ORDER — FENTANYL CITRATE (PF) 100 MCG/2ML IJ SOLN
INTRAMUSCULAR | Status: AC
  Filled 2016-01-03: qty 2

## 2016-01-03 MED ORDER — GUAIFENESIN-DM 100-10 MG/5ML PO SYRP: 15.0000 mL | ORAL_SOLUTION | ORAL | Status: DC | PRN

## 2016-01-03 MED ORDER — ONDANSETRON HCL 4 MG/2ML IJ SOLN
INTRAMUSCULAR | Status: DC | PRN
  Administered 2016-01-03: 4 mg via INTRAVENOUS

## 2016-01-03 MED ORDER — FENTANYL CITRATE (PF) 100 MCG/2ML IJ SOLN
25.0000 ug | INTRAMUSCULAR | Status: DC | PRN
  Administered 2016-01-03 (×3): 50 ug via INTRAVENOUS

## 2016-01-03 MED ORDER — METOPROLOL TARTRATE 1 MG/ML IV SOLN
INTRAVENOUS | Status: AC
  Filled 2016-01-03: qty 5

## 2016-01-03 MED ORDER — IOHEXOL 300 MG/ML  SOLN
INTRAMUSCULAR | Status: DC | PRN
  Administered 2016-01-03: 25 mL via INTRAVENOUS

## 2016-01-03 MED ORDER — PHENYLEPHRINE HCL 10 MG/ML IJ SOLN
10.0000 mg | INTRAVENOUS | Status: DC | PRN
  Administered 2016-01-03: 50 ug/min via INTRAVENOUS

## 2016-01-03 MED ORDER — PROMETHAZINE HCL 25 MG/ML IJ SOLN: 6.2500 mg | INTRAMUSCULAR | Status: DC | PRN

## 2016-01-03 MED ORDER — ROCURONIUM BROMIDE 100 MG/10ML IV SOLN
INTRAVENOUS | Status: DC | PRN
  Administered 2016-01-03: 50 mg via INTRAVENOUS

## 2016-01-03 MED ORDER — MIDAZOLAM HCL 2 MG/2ML IJ SOLN
INTRAMUSCULAR | Status: AC
  Filled 2016-01-03: qty 2

## 2016-01-03 SURGICAL SUPPLY — 56 items
BANDAGE ESMARK 6X9 LF (GAUZE/BANDAGES/DRESSINGS) IMPLANT
BNDG CMPR 9X6 STRL LF SNTH (GAUZE/BANDAGES/DRESSINGS)
BNDG ESMARK 6X9 LF (GAUZE/BANDAGES/DRESSINGS)
BNDG GAUZE ELAST 4 BULKY (GAUZE/BANDAGES/DRESSINGS) ×2 IMPLANT
CANISTER SUCTION 2500CC (MISCELLANEOUS) ×4 IMPLANT
CATH EMB 3FR 80CM (CATHETERS) ×2 IMPLANT
CLIP TI MEDIUM 24 (CLIP) ×4 IMPLANT
CLIP TI WIDE RED SMALL 24 (CLIP) ×4 IMPLANT
DECANTER SPIKE VIAL GLASS SM (MISCELLANEOUS) IMPLANT
DRAIN SNY 10X20 3/4 PERF (WOUND CARE) IMPLANT
DRAPE PROXIMA HALF (DRAPES) IMPLANT
DRAPE X-RAY CASS 24X20 (DRAPES) ×2 IMPLANT
ELECT REM PT RETURN 9FT ADLT (ELECTROSURGICAL) ×4
ELECTRODE REM PT RTRN 9FT ADLT (ELECTROSURGICAL) ×2 IMPLANT
EVACUATOR SILICONE 100CC (DRAIN) IMPLANT
GLOVE BIOGEL PI IND STRL 6.5 (GLOVE) IMPLANT
GLOVE BIOGEL PI INDICATOR 6.5 (GLOVE) ×4
GLOVE SS BIOGEL STRL SZ 7 (GLOVE) ×2 IMPLANT
GLOVE SUPERSENSE BIOGEL SZ 7 (GLOVE)
GLOVE SURG SS PI 6.5 STRL IVOR (GLOVE) ×6 IMPLANT
GLOVE SURG SS PI 7.0 STRL IVOR (GLOVE) ×2 IMPLANT
GOWN STRL REUS W/ TWL LRG LVL3 (GOWN DISPOSABLE) ×6 IMPLANT
GOWN STRL REUS W/TWL LRG LVL3 (GOWN DISPOSABLE) ×12
INSERT FOGARTY SM (MISCELLANEOUS) ×4 IMPLANT
KIT BASIN OR (CUSTOM PROCEDURE TRAY) ×4 IMPLANT
KIT ROOM TURNOVER OR (KITS) ×4 IMPLANT
LIQUID BAND (GAUZE/BANDAGES/DRESSINGS) ×6 IMPLANT
NS IRRIG 1000ML POUR BTL (IV SOLUTION) ×8 IMPLANT
PACK PERIPHERAL VASCULAR (CUSTOM PROCEDURE TRAY) ×4 IMPLANT
PAD ARMBOARD 7.5X6 YLW CONV (MISCELLANEOUS) ×8 IMPLANT
PADDING CAST COTTON 6X4 STRL (CAST SUPPLIES) IMPLANT
SET COLLECT BLD 21X3/4 12 (NEEDLE) ×2 IMPLANT
SPONGE LAP 18X18 X RAY DECT (DISPOSABLE) ×2 IMPLANT
STOPCOCK 4 WAY LG BORE MALE ST (IV SETS) ×2 IMPLANT
SUT PROLENE 5 0 C1 (SUTURE) ×2 IMPLANT
SUT PROLENE 6 0 BV (SUTURE) ×2 IMPLANT
SUT PROLENE 6 0 C 1 24 (SUTURE) ×2 IMPLANT
SUT PROLENE 6 0 CC (SUTURE) ×8 IMPLANT
SUT PROLENE 7 0 BV 1 (SUTURE) ×2 IMPLANT
SUT SILK 2 0 (SUTURE) ×4
SUT SILK 2 0 SH (SUTURE) ×4 IMPLANT
SUT SILK 2-0 18XBRD TIE 12 (SUTURE) IMPLANT
SUT SILK 3 0 (SUTURE)
SUT SILK 3-0 18XBRD TIE 12 (SUTURE) IMPLANT
SUT SILK 4 0 (SUTURE) ×4
SUT SILK 4-0 18XBRD TIE 12 (SUTURE) IMPLANT
SUT VIC AB 2-0 CTX 36 (SUTURE) ×8 IMPLANT
SUT VIC AB 3-0 SH 27 (SUTURE) ×20
SUT VIC AB 3-0 SH 27X BRD (SUTURE) ×4 IMPLANT
SUT VICRYL 4-0 PS2 18IN ABS (SUTURE) ×6 IMPLANT
SYR TB 1ML LUER SLIP (SYRINGE) ×2 IMPLANT
TRAY FOLEY CATH SILVER 16FR LF (SET/KITS/TRAYS/PACK) ×2 IMPLANT
TRAY FOLEY W/METER SILVER 16FR (SET/KITS/TRAYS/PACK) ×2 IMPLANT
TUBING EXTENTION W/L.L. (IV SETS) ×2 IMPLANT
UNDERPAD 30X30 INCONTINENT (UNDERPADS AND DIAPERS) ×4 IMPLANT
WATER STERILE IRR 1000ML POUR (IV SOLUTION) ×4 IMPLANT

## 2016-01-03 NOTE — Anesthesia Postprocedure Evaluation (Signed)
Anesthesia Post Note  Patient: Clinton Harrington  Procedure(s) Performed: Procedure(s) (LRB): LEFT COMMON FEMORAL-BELOW KNEE POPLITEAL ARTERY BYPASS GRAFT USING NON REVERSED TRANSLOCATED SAPHENOUS VEIN GRAFT (Left) INTRA OPERATIVE ARTERIOGRAM LEFT LEG (Left)  Patient location during evaluation: PACU Anesthesia Type: General Level of consciousness: confused Pain management: pain level controlled Vital Signs Assessment: post-procedure vital signs reviewed and stable Respiratory status: spontaneous breathing and patient connected to nasal cannula oxygen Cardiovascular status: stable Postop Assessment: no headache Anesthetic complications: no    Last Vitals:  Filed Vitals:   01/03/16 0430 01/03/16 1031  BP: 172/91 194/94  Pulse: 93 100  Temp: 36.8 C 36.4 C  Resp: 18 20    Last Pain:  Filed Vitals:   01/03/16 1050  PainSc: 0-No pain                 Lyndon Chenoweth

## 2016-01-03 NOTE — Anesthesia Postprocedure Evaluation (Signed)
Anesthesia Post Note  Patient: Clinton Harrington  Procedure(s) Performed: Procedure(s) (LRB): LEFT COMMON FEMORAL-BELOW KNEE POPLITEAL ARTERY BYPASS GRAFT USING NON REVERSED TRANSLOCATED SAPHENOUS VEIN GRAFT (Left) INTRA OPERATIVE ARTERIOGRAM LEFT LEG (Left)  Anesthesia Post Evaluation  Last Vitals:  Filed Vitals:   01/03/16 0430 01/03/16 1031  BP: 172/91 194/94  Pulse: 93 100  Temp: 36.8 C 36.4 C  Resp: 18 20    Last Pain:  Filed Vitals:   01/03/16 1050  PainSc: 0-No pain                 Nastassja Witkop

## 2016-01-03 NOTE — Anesthesia Postprocedure Evaluation (Signed)
Anesthesia Post Note  Patient: Clinton Harrington  Procedure(s) Performed: Procedure(s) (LRB): LEFT COMMON FEMORAL-BELOW KNEE POPLITEAL ARTERY BYPASS GRAFT USING NON REVERSED TRANSLOCATED SAPHENOUS VEIN GRAFT (Left) INTRA OPERATIVE ARTERIOGRAM LEFT LEG (Left)  Patient location during evaluation: PACU Anesthesia Type: General Level of consciousness: awake Pain management: pain level controlled Vital Signs Assessment: post-procedure vital signs reviewed and stable Respiratory status: spontaneous breathing Cardiovascular status: stable Anesthetic complications: no    Last Vitals:  Filed Vitals:   01/03/16 0430 01/03/16 1031  BP: 172/91 194/94  Pulse: 93 100  Temp: 36.8 C 36.4 C  Resp: 18 20    Last Pain:  Filed Vitals:   01/03/16 1050  PainSc: 0-No pain                 EDWARDS,Petro Talent

## 2016-01-03 NOTE — H&P (View-Only) (Signed)
   Daily Progress Note  Assessment/Planning: POD #1 s/p PTA L SFA for L heel gangrene, L dorsal ulcer   ABI nearly identical from preop. Unclear exact reason given resolution of near occlusion of L AK pop artery stenosis.   Given pt has limb threatening wounds, would proceed with L CFA to BK popliteal bypass to try to salvage this L foot  The risk, benefits, and alternative for bypass operations were discussed with the patient. The patient is aware the risks include but are not limited to: bleeding, infection, myocardial infarction, stroke, limb loss, nerve damage, need for additional procedures in the future, wound complications, and inability to complete the bypass.   I had emphasized to this patient that he is already at high risk for limb loss given the heel gangrene, but the patient is unwilling to proceed with an amputation.  The patient is aware of these risks and agreed to proceed.  In regards to his L foot wounds, case already d/w Dr. Lajoyce Corners (gone for rest of week), appt already arranged for this coming week as outpatient  Will add an empiric antibiotic given odor from left foot   Subjective - 1 Day Post-Op  No significant changes  Objective Filed Vitals:   01/02/16 0350 01/02/16 0747 01/02/16 0959 01/02/16 1146  BP: 104/67 103/72 96/52 115/69  Pulse: 99 104  113  Temp:  99 F (37.2 C)  98.6 F (37 C)  TempSrc:  Oral  Oral  Resp: Height:      Weight:      SpO2: 93% 92%  96%    Intake/Output Summary (Last 24 hours) at 01/02/16 1635 Last data filed at 01/02/16 1348  Gross per 24 hour  Intake 971.67 ml  Output  1816 ml  Net -844.33 ml    PULM CTAB CV RRR GI soft, NTND VASC L foot appears viable, warm foot without palpable pulses, L heel dry gangrene (not fully thickness), dry dorsal ulcer, +odor, no drainage   Laboratory CBC  Labs (Brief)       Component Value  Date/Time   WBC 10.3 01/01/2016 2358   HGB 10.1* 01/01/2016 2358   HCT 30.8* 01/01/2016 2358   PLT 144* 01/01/2016 2358      BMET  Labs (Brief)       Component Value Date/Time   NA 138 01/01/2016 2358   K 3.9 01/01/2016 2358   CL 101 01/01/2016 2358   CO2 26 01/01/2016 2358   GLUCOSE 160* 01/01/2016 2358   BUN 10 01/01/2016 2358   CREATININE 0.88 01/01/2016 2358   CREATININE 0.70 10/20/2015 1031   CALCIUM 8.6* 01/01/2016 2358   GFRNONAA >60 01/01/2016 2358   GFRAA >60 01/01/2016 2358      Leonides Sake, MD, FACS [facility] Office: [phone] Pager: [phone]  01/02/2016, 4:46 PM

## 2016-01-03 NOTE — Progress Notes (Signed)
Patient's mother sitting in 2W09 - this RN notified patient will not be returning to 2W. Called PACU and they recommended I bring her to OR area and ask volunteer to notify PACU - staff or MD will come out to update her. Took mother via w/c with patient belongings to OR waiting area [place] tower area.

## 2016-01-03 NOTE — Progress Notes (Signed)
Placed on tele to monitor pt 

## 2016-01-03 NOTE — Op Note (Signed)
OPERATIVE REPORT  Date of Surgery: 01/01/2016 - 01/03/2016  Surgeon: Josephina Gip, MD  Assistant: Karsten Ro  pa  Pre-op Diagnosis: Ischemic ulceration left foot times two secondary to femoral popliteal and tibial occlusive disease  Post-op Diagnosis: Ischemic ulceration left foot times two secondary to femoral popliteal and tibial occlusive disease  Procedure: Procedure(s): LEFT COMMON FEMORAL-BELOW KNEE POPLITEAL ARTERY BYPASS GRAFT USING NON REVERSED TRANSLOCATED SAPHENOUS VEIN GRAFT INTRA OPERATIVE ARTERIOGRAM LEFT LEG  Anesthesia: General  EBL: 200 cc Complications: None The patient was taken the operating room placed in supine position at which time satisfactory general endotracheal anesthesia was administered. The left leg was prepped with Betadine scrub and solution draped in routine sterile manner. Saphenous vein was exposed at the saphenofemoral junction and dissected free down to the mid calf through multiple incisions along the medial aspect of the left leg. Branches were ligated with 3 and 4-0 silk ties and divided. It was a satisfactory vein. It did taper somewhat in the mid portion but was at least 2-1/2-3 mm in size throughout. It was removed gently dilated with heparinized saline and marked for orientation purposes. The common superficial and profunda femoris arteries were dissected free and the inguinal incision. There is an excellent pulse in the common femoral artery anteriorly all other was posterior plaque throughout. Popliteal artery was exposed below the knee through the medial vein harvesting incision area it was a diffusely diseased vessel but became softer at its distal aspect. The anterior tibial and tibioperoneal trunks were encircled with red vessel loops. It was disease throughout these vessels. Subfascial anatomic tunnel was created and the patient was heparinized. Femoral arteries occluded faster clamps and longitudinal opening made with a 15 blade in the common  femoral artery extended with the Potts scissors. There was excellent inflow. The proximal end of the saphenous vein was spatulated and anastomosed end to side with 6-0 Prolene. Clamps released there was good pulse in the first set of competent valves. Using the retrograde valvulotome the valves were rendered incompetent with resultant adequate flow at the distal end of the vein graft. Vein was carefully delivered through the tunnel popliteal artery was then occluded proximally and distally opened 15 blade extended with Potts scissors. There was backbleeding coming from the tibioperoneal trunk and anterior tibial arteries although they were diseased. The vein was spatulated and anastomosed end-to-side to the distal popliteal artery with 6-0 Prolene. Following appropriate flushing is completed the clamps released there was an excellent pulse in the distal end of the vein graft with improved Doppler flow audible Doppler flow in the anterior tibial artery at the ankle. Intraoperative arteriogram revealed widely patent anastomosis with two-vessel runoff through the peroneal and anterior tibial arteries. Protamine was given to reverse the heparin following adequate hemostasis the wounds irrigated with saline closed in layers of Vicryl subcuticular fashion with Dermabond patient taken to recovery room in satisfactory condition Procedure Details:   Josephina Gip, MD 01/03/2016 3:08 PM

## 2016-01-03 NOTE — Progress Notes (Addendum)
  [facility] Progress Note  Subjective  - Day of Surgery  Patient seen in PACU. Not speaking too much. Just received IV pain meds. Mother at bedside.   Objective Filed Vitals:   01/03/16 0430 01/03/16 1031  BP: 172/91 194/94  Pulse: 93 100  Temp: 98.2 F (36.8 C) 97.5 F (36.4 C)  Resp: 18 20    Intake/Output Summary (Last 24 hours) at 01/03/16 1656 Last data filed at 01/03/16 1540  Gross per 24 hour  Intake   2090 ml  Output   2100 ml  Net    -10 ml    Left leg incisions clean and intact. Palpable left DP pulse.  Left heel dressed. Left fifth toe dusky.   Assessment/Planning: 65 y.o. male is s/p: left common femoral to below-knee popliteal artery bypass using non-reversed translocated saphenous vein graft with intraoperative arteriogram  Day of Surgery   Palpable DP pulse. Hypertensive post-op. Requiring prn anti-hypertensives. Otherwise stable.  To 3S when bed available.   Raymond Gurney 01/03/2016 4:56 PM --  Laboratory CBC    Component Value Date/Time   WBC 9.0 01/03/2016 0249   HGB 13.8 01/03/2016 0249   HCT 40.8 01/03/2016 0249   PLT 233 01/03/2016 0249    BMET    Component Value Date/Time   NA 130* 01/03/2016 0249   K 2.9* 01/03/2016 0249   CL 93* 01/03/2016 0249   CO2 22 01/03/2016 0249   GLUCOSE 125* 01/03/2016 0249   BUN <5* 01/03/2016 0249   CREATININE 0.82 01/03/2016 0249   CALCIUM 8.5* 01/03/2016 0249   GFRNONAA >60 01/03/2016 0249   GFRAA >60 01/03/2016 0249    COAG No results found for: INR, PROTIME No results found for: PTT  Antibiotics Anti-infectives    Start     Dose/Rate Route Frequency Ordered Stop   01/03/16 1100  cefUROXime (ZINACEF) 1.5 g in dextrose 5 % 50 mL IVPB  Status:  Discontinued     1.5 g 100 mL/hr over 30 Minutes Intravenous To ShortStay Surgical 01/02/16 1302 01/02/16 1656   01/02/16 1700  ceFAZolin (ANCEF) injection 1 g  Status:  Discontinued    Comments:  Empiric abx for possible  infected L heel gangrene   1 g Intramuscular 3 times per day 01/02/16 1646 01/02/16 1655   01/02/16 1700  [MAR Hold]  ceFAZolin (ANCEF) IVPB 1 g/50 mL premix     (MAR Hold since 01/03/16 1040)   1 g 100 mL/hr over 30 Minutes Intravenous 3 times per day 01/02/16 1655         Maris Berger, PA-C [facility] Office: [phone] Pager: [phone] 01/03/2016 4:56 PM

## 2016-01-03 NOTE — Anesthesia Procedure Notes (Signed)
Procedure Name: Intubation Date/Time: 01/03/2016 12:00 PM Performed by: Reine Just Pre-anesthesia Checklist: Patient identified, Emergency Drugs available, Suction available, Patient being monitored and Timeout performed Patient Re-evaluated:Patient Re-evaluated prior to inductionOxygen Delivery Method: Circle system utilized Preoxygenation: Pre-oxygenation with 100% oxygen Intubation Type: IV induction Ventilation: Mask ventilation without difficulty Laryngoscope Size: Miller and 3 Grade View: Grade I Tube type: Oral Tube size: 7.5 mm Number of attempts: 1 Placement Confirmation: ETT inserted through vocal cords under direct vision,  positive ETCO2,  CO2 detector and breath sounds checked- equal and bilateral Secured at: 23 cm Tube secured with: Tape Dental Injury: Teeth and Oropharynx as per pre-operative assessment

## 2016-01-03 NOTE — Anesthesia Preprocedure Evaluation (Addendum)
Anesthesia Evaluation  Patient identified by MRN, date of birth, ID band Patient awake    Reviewed: Allergy & Precautions, NPO status , Patient's Chart, lab work & pertinent test results  Airway Mallampati: II  TM Distance: >3 FB Neck ROM: Full    Dental   Pulmonary Current Smoker,    breath sounds clear to auscultation       Cardiovascular hypertension, + CAD, + Past MI and + Peripheral Vascular Disease   Rhythm:Regular Rate:Normal     Neuro/Psych    GI/Hepatic negative GI ROS, Neg liver ROS,   Endo/Other  diabetes  Renal/GU negative Renal ROS     Musculoskeletal   Abdominal   Peds  Hematology   Anesthesia Other Findings   Reproductive/Obstetrics                            Anesthesia Physical Anesthesia Plan  ASA: III  Anesthesia Plan: General   Post-op Pain Management:    Induction: Intravenous  Airway Management Planned: Oral ETT  Additional Equipment:   Intra-op Plan:   Post-operative Plan: Extubation in OR  Informed Consent:   Dental advisory given  Plan Discussed with: CRNA and Anesthesiologist  Anesthesia Plan Comments:         Anesthesia Quick Evaluation

## 2016-01-03 NOTE — Interval H&P Note (Signed)
History and Physical Interval Note:  01/03/2016 11:00 AM  Clinton Harrington  has presented today for surgery, with the diagnosis of Peripheral vascular disease with left foot gangrene I70.262  The various methods of treatment have been discussed with the patient and family. After consideration of risks, benefits and other options for treatment, the patient has consented to  Procedure(s): BYPASS GRAFT COMMON FEMORAL-BELOW KNEE POPLITEAL ARTERY (Left) as a surgical intervention .  The patient's history has been reviewed, patient examined, no change in status, stable for surgery.  I have reviewed the patient's chart and labs.  Questions were answered to the patient's satisfaction.     Josephina Gip

## 2016-01-03 NOTE — Transfer of Care (Signed)
Immediate Anesthesia Transfer of Care Note  Patient: Clinton Harrington  Procedure(s) Performed: Procedure(s): LEFT COMMON FEMORAL-BELOW KNEE POPLITEAL ARTERY BYPASS GRAFT USING NON REVERSED TRANSLOCATED SAPHENOUS VEIN GRAFT (Left) INTRA OPERATIVE ARTERIOGRAM LEFT LEG (Left)  Patient Location: PACU  Anesthesia Type:General  Level of Consciousness: awake  Airway & Oxygen Therapy: Patient connected to nasal cannula oxygen  Post-op Assessment: Report given to RN and Post -op Vital signs reviewed and stable  Post vital signs: Reviewed and stable  Last Vitals:  Filed Vitals:   01/03/16 0430 01/03/16 1031  BP: 172/91 194/94  Pulse: 93 100  Temp: 36.8 C 36.4 C  Resp: 18 20    Complications: No apparent anesthesia complications

## 2016-01-04 ENCOUNTER — Encounter (HOSPITAL_COMMUNITY): Payer: Self-pay | Admitting: Vascular Surgery

## 2016-01-04 ENCOUNTER — Inpatient Hospital Stay (HOSPITAL_COMMUNITY): Payer: Medicare Other

## 2016-01-04 DIAGNOSIS — I7025 Atherosclerosis of native arteries of other extremities with ulceration: Secondary | ICD-10-CM

## 2016-01-04 LAB — BASIC METABOLIC PANEL
ANION GAP: 10 (ref 5–15)
BUN: <5 mg/dL — ABNORMAL LOW (ref 6–20)
CALCIUM: 8.6 mg/dL — AB (ref 8.9–10.3)
CO2: 26 mmol/L (ref 22–32)
Chloride: 99 mmol/L — ABNORMAL LOW (ref 101–111)
Creatinine, Ser: 0.67 mg/dL (ref 0.61–1.24)
GLUCOSE: 120 mg/dL — AB (ref 65–99)
POTASSIUM: 3.1 mmol/L — AB (ref 3.5–5.1)
SODIUM: 135 mmol/L (ref 135–145)

## 2016-01-04 LAB — CBC
HCT: 39.1 % (ref 39.0–52.0)
Hemoglobin: 13.3 g/dL (ref 13.0–17.0)
MCH: 28.4 pg (ref 26.0–34.0)
MCHC: 34 g/dL (ref 30.0–36.0)
MCV: 83.5 fL (ref 78.0–100.0)
PLATELETS: 222 10*3/uL (ref 150–400)
RBC: 4.68 MIL/uL (ref 4.22–5.81)
RDW: 14.5 % (ref 11.5–15.5)
WBC: 9.7 10*3/uL (ref 4.0–10.5)

## 2016-01-04 LAB — GLUCOSE, CAPILLARY
GLUCOSE-CAPILLARY: 112 mg/dL — AB (ref 65–99)
GLUCOSE-CAPILLARY: 163 mg/dL — AB (ref 65–99)
GLUCOSE-CAPILLARY: 195 mg/dL — AB (ref 65–99)
GLUCOSE-CAPILLARY: 150 mg/dL — AB (ref 65–99)
Glucose-Capillary: 189 mg/dL — ABNORMAL HIGH (ref 65–99)

## 2016-01-04 MED ORDER — TRAMADOL HCL 50 MG PO TABS: 50.0000 mg | ORAL_TABLET | Freq: Four times a day (QID) | ORAL | Status: DC | PRN

## 2016-01-04 MED ORDER — POTASSIUM CHLORIDE CRYS ER 20 MEQ PO TBCR
40.0000 meq | EXTENDED_RELEASE_TABLET | Freq: Once | ORAL | Status: AC
  Administered 2016-01-04: 40 meq via ORAL
  Filled 2016-01-04: qty 2

## 2016-01-04 NOTE — Progress Notes (Signed)
Pt transferred to 2W34 after report given to RN.  Pt belongings, paperwork, and walker transferred in bed.

## 2016-01-04 NOTE — Care Management Important Message (Signed)
Important Message  Patient Details  Name: Clinton Harrington MRN: [id number] Date of Birth: 04-Jun-1951   Medicare Important Message Given:  Yes    Kyla Balzarine 01/04/2016, 1:33 PM

## 2016-01-04 NOTE — Evaluation (Signed)
Physical Therapy Evaluation Patient Details Name: Clinton Harrington MRN: [id number] DOB: [birth year] Today's Date: 01/04/2016   History of Present Illness  Pt is a 65 yo male admitted for L fem BK pop bypass graft in attempts to salvage his L foot.  Pt with L heel wound with pain.  Pt's PMH includes MI, DM, coronary stent placement.  Clinical Impression  Pt admitted with above diagnosis. Pt currently with functional limitations due to the deficits listed below (see PT Problem List). Clinton Harrington currently requires min guard assist for safe use of RW and safe transfers and ambulation.  He lives with his mother who is available to provide supervision prn.  Pt will need to practice stair training prior to d/c. Pt will benefit from skilled PT to increase their independence and safety with mobility to allow discharge to the venue listed below.      Follow Up Recommendations Home health PT;Supervision for mobility/OOB    Equipment Recommendations  Rolling walker with 5" wheels;3in1 (PT)    Recommendations for Other Services       Precautions / Restrictions Precautions Precautions: Fall Restrictions Weight Bearing Restrictions: Yes LLE Weight Bearing: Partial weight bearing LLE Partial Weight Bearing Percentage or Pounds: WB through forefoot only.      Mobility  Bed Mobility Overal bed mobility: Needs Assistance Bed Mobility: Supine to Sit     Supine to sit: Min guard Sit to supine: Mod assist   General bed mobility comments: Increased time and cues for technique w/ HOB flat.   Transfers Overall transfer level: Needs assistance Equipment used: Rolling walker (2 wheeled) Transfers: Sit to/from Stand Sit to Stand: Min guard Stand pivot transfers: Min assist       General transfer comment: Cues for hand placement as pt attempts sit<>stand w/ hands on RW.  Pt slow to stand.  Min instability noted.  Ambulation/Gait Ambulation/Gait assistance: Min guard Ambulation Distance (Feet):  60 Feet Assistive device: Rolling walker (2 wheeled) Gait Pattern/deviations: Step-to pattern;Decreased stride length;Decreased stance time - left;Decreased step length - right;Antalgic;Trunk flexed;Decreased weight shift to left   Gait velocity interpretation: Below normal speed for age/gender General Gait Details: WB through Lt forefoot only.  Limited distance due to 10/10 pain w/ ambulation.   Stairs            Wheelchair Mobility    Modified Rankin (Stroke Patients Only)       Balance Overall balance assessment: Needs assistance Sitting-balance support: Feet supported;No upper extremity supported Sitting balance-Leahy Scale: Good     Standing balance support: Bilateral upper extremity supported;During functional activity Standing balance-Leahy Scale: Poor Standing balance comment: Relies on RW for support                             Pertinent Vitals/Pain Pain Assessment: 0-10 Pain Score: 10-Worst pain ever Pain Location: Lt LE w/ ambulation Pain Descriptors / Indicators: Aching;Tightness Pain Intervention(s): Limited activity within patient's tolerance;Monitored during session    Home Living Family/patient expects to be discharged to:: Private residence Living Arrangements: Parent Available Help at Discharge: Available PRN/intermittently Type of Home: House Home Access: Stairs to enter Entrance Stairs-Rails: None Entrance Stairs-Number of Steps: 2 Home Layout: One level Home Equipment: Information systems manager;Other (comment) (walking stick) Additional Comments: walking stick.    Prior Function Level of Independence: Independent with assistive device(s)         Comments: used walking stick to ambulate and was WB on Lt forefoot  prior to admit.  Otherwise independent.     Hand Dominance   Dominant Hand: Right    Extremity/Trunk Assessment   Upper Extremity Assessment: Defer to OT evaluation           Lower Extremity Assessment: LLE  deficits/detail   LLE Deficits / Details: limited ROM and strength as expected s/p Lt fem pop bypass.  Pt unable to actively DF due to pain and chronically ambulating on forefoot  Cervical / Trunk Assessment: Normal  Communication   Communication: No difficulties  Cognition Arousal/Alertness: Awake/alert Behavior During Therapy: WFL for tasks assessed/performed Overall Cognitive Status: Within Functional Limits for tasks assessed                      General Comments General comments (skin integrity, edema, etc.): Pt requires more assist with LE adl.  Will introduce adaptive equipment to pt.    Exercises General Exercises - Lower Extremity Ankle Circles/Pumps: AROM;Right;10 reps;Seated;Limitations Ankle Circles/Pumps Limitations: Pt unable to perform Lt DF AROM due to pain and calf tightness Long Arc Quad: AROM;Both;10 reps;Seated;Limitations Long [employer] Limitations: Pt required reassurance that he would not do any damage to his incision by performing this exercises, encouraged full AROM      Assessment/Plan    PT Assessment Patient needs continued PT services  PT Diagnosis Difficulty walking;Acute pain   PT Problem List Decreased strength;Decreased range of motion;Decreased activity tolerance;Decreased balance;Decreased mobility;Decreased knowledge of use of DME;Decreased safety awareness;Pain;Impaired sensation  PT Treatment Interventions DME instruction;Gait training;Stair training;Functional mobility training;Therapeutic activities;Therapeutic exercise;Balance training;Neuromuscular re-education;Patient/family education   PT Goals (Current goals can be found in the Care Plan section) Acute Rehab PT Goals Patient Stated Goal: to get home tomorrow PT Goal Formulation: With patient Time For Goal Achievement: 01/11/16 Potential to Achieve Goals: Good    Frequency Min 3X/week   Barriers to discharge Inaccessible home environment;Decreased caregiver  support Intermittent assist and steps to enter home    Co-evaluation               End of Session Equipment Utilized During Treatment: Gait belt Activity Tolerance: Patient tolerated treatment well;Patient limited by pain Patient left: in chair;with call bell/phone within reach Nurse Communication: Mobility status;Weight bearing status         Time: 1610-9604 PT Time Calculation (min) (ACUTE ONLY): 20 min   Charges:   PT Evaluation $PT Eval Moderate Complexity: 1 Procedure     PT G Codes:       Michail Jewels PT, DPT [phone] Pager: [phone] 01/04/2016, 2:31 PM

## 2016-01-04 NOTE — Progress Notes (Signed)
Patient to transfer to 2W34 attempted to give report receiving nurse unable to do so at this time.  Will continue to follow up.

## 2016-01-04 NOTE — Care Management Note (Addendum)
Case Management Note  Patient Details  Name: Clinton Harrington MRN: [id number] Date of Birth: Jul 22, 1951  Subjective/Objective:     Date: 01/04/16 Spoke with patient at the bedside.  Introduced self as [name] and explained role in discharge planning and how to be reached.  Verified patient lives in town, with Mother who is 62 years old. Has a walking stick (cane), he has a cpap machine through [facility].  Expressed potential need for rolling walker, referral given to [facility] with [facility], will bring to patients room.  Verified patient anticipates to go home with mother at time of discharge and will have part-time supervision by family at this time to best of their knowledge.  Patient denied needing help with their medication.  Patient  is driven by mother if needed to MD appointments.  Verified patient has PCP Dr. Johny Blamer. Await pt/ot eval. Patient states if he needs [facility] services he would like to work with [facility].    Plan: CM will continue to follow for discharge planning and [facility] resources.                Action/Plan:   Expected Discharge Date:  01/03/16               Expected Discharge Plan:  Home w Home Health Services  In-House Referral:     Discharge planning Services  CM Consult  Post Acute Care Choice:    Choice offered to:     DME Arranged:   Rolling Walker , 3 n 1 DME Agency:   [facility]  HH Arranged:   HHPT, HHOT HH Agency:   [facility]  Status of Service:  In process, will continue to follow  Medicare Important Message Given:    Date Medicare IM Given:    Medicare IM give by:    Date Additional Medicare IM Given:    Additional Medicare Important Message give by:     If discussed at Long Length of Stay Meetings, dates discussed:    Additional Comments:  Leone Haven, RN 01/04/2016, 10:40 AM

## 2016-01-04 NOTE — Progress Notes (Signed)
VASCULAR LAB PRELIMINARY  PRELIMINARY  PRELIMINARY  PRELIMINARY   ARTERIAL  ABI completed:    RIGHT    LEFT    PRESSURE WAVEFORM  PRESSURE WAVEFORM  BRACHIAL 150 Triphasic BRACHIAL 147 Triphasic  DP 163 Triphasic DP 112 Biphasic  PT 118 Triphasic PT 86 Biphasic    RIGHT LEFT  ABI 1.09 0.75   ABIs on the right indicate artery flow with normal Doppler waveforms at rest. Left ABIs indicate moderate artery flow at rest with abnormal waveforms. ABIs have improve to prior exam on 01/01/2015.  Jenetta Loges, RVT 01/04/2016, 12:18 PM

## 2016-01-04 NOTE — Progress Notes (Signed)
PT Cancellation Note  Patient Details Name: APOLINAR BERO MRN: [id number] DOB: 1951-05-04   Cancelled Treatment:    Reason Eval/Treat Not Completed: Other (comment).  Pt is awaiting delivery of materials for dressing change this morning.  Will hold PT until dressing change complete.    Michail Jewels PT, DPT [phone] Pager: [phone] 01/04/2016, 9:03 AM

## 2016-01-04 NOTE — Clinical Documentation Improvement (Signed)
Vascular Surgery  Abnormal Lab/Test Results: Na+ 130 noted in 01/03/16 labs.   Please provide a diagnosis associated with abnormal lab value and treatment provided if applicable. Document findings in next progress note; NOT in BPA drop down box.   Other Condition  Cannot Clinically Determine  Supporting Information:  Treatment Provided:  PIV of 0.9% NS infusing at 75cc/hr; then changed to Ringer's Lactate  Na+ improved to 135  Please exercise your independent, professional judgment when responding. A specific answer is not anticipated or expected.  Thank You,  Shellee Milo RN, BSN, CCDS Health Information Management [place] [phone]; Cell: [phone]

## 2016-01-04 NOTE — Progress Notes (Addendum)
  [facility] Progress Note  Subjective  - POD #1  Having pain with left heel wound.   Objective Filed Vitals:   01/04/16 0440 01/04/16 0447  BP: 146/80   Pulse: 98   Temp:  98.2 F (36.8 C)  Resp: 20   Tmax 98.4 BP sys 140s-190s 94% RA  Intake/Output Summary (Last 24 hours) at 01/04/16 0820 Last data filed at 01/04/16 0600  Gross per 24 hour  Intake   2750 ml  Output   2675 ml  Net     75 ml   Left leg incisions c/d/i Left heel dry gangrene. Left dorsal ulcer dry. Some erythema of left dorsum with odor.  Palpable left DP pulse.   Assessment/Planning: 65 y.o. male is s/p: left common femoral to below-knee popliteal artery bypass using non-reversed translocated saphenous vein graft with intraoperative arteriogram 1 Day Post-Op   Bypass is patent.  H/H stable.  Santyl twice daily to left heel and dorsal wounds. Dry gauze to left groin to wick moisture.  Keep heels floated off bed.  Mobilize with left forefoot weightbearing only.  Discussed that despite revascularization, patient is still at risk for losing foot given extent of left heel wound.  Transfer to 2W.   Cala Bradford A Trinh 01/04/2016 8:20 AM --  Laboratory CBC    Component Value Date/Time   WBC 9.7 01/04/2016 0612   HGB 13.3 01/04/2016 0612   HCT 39.1 01/04/2016 0612   PLT 222 01/04/2016 0612    BMET    Component Value Date/Time   NA 135 01/04/2016 0612   K 3.1* 01/04/2016 0612   CL 99* 01/04/2016 0612   CO2 26 01/04/2016 0612   GLUCOSE 120* 01/04/2016 0612   BUN <5* 01/04/2016 0612   CREATININE 0.67 01/04/2016 0612   CALCIUM 8.6* 01/04/2016 0612   GFRNONAA >60 01/04/2016 0612   GFRAA >60 01/04/2016 0612    COAG No results found for: INR, PROTIME No results found for: PTT  Antibiotics Anti-infectives    Start     Dose/Rate Route Frequency Ordered Stop   01/03/16 1100  cefUROXime (ZINACEF) 1.5 g in dextrose 5 % 50 mL IVPB  Status:  Discontinued     1.5 g 100 mL/hr  over 30 Minutes Intravenous To ShortStay Surgical 01/02/16 1302 01/02/16 1656   01/02/16 1700  ceFAZolin (ANCEF) injection 1 g  Status:  Discontinued    Comments:  Empiric abx for possible infected L heel gangrene   1 g Intramuscular 3 times per day 01/02/16 1646 01/02/16 1655   01/02/16 1700  ceFAZolin (ANCEF) IVPB 1 g/50 mL premix     1 g 100 mL/hr over 30 Minutes Intravenous 3 times per day 01/02/16 1655         Maris Berger, PA-C [facility] Office: [phone] Pager: [phone] 01/04/2016 8:20 AM   Agree with above assessment 3+ dorsalis pedis pulse palpable left foot All surgical incisions healing nicely Eschar on dorsum of left foot and left heel ulcer are dry. Emphasized to patient importance of keeping pressure off of left heel and keep it elevated on pillows.  We'll transfer to 2 W. begin ambulation and mobilize as tolerated. Hopefully we'll be able to discharge home in the next few days Patient will follow-up with Dr. Berna Spare duda  in the next week or 2 Also is followed in the wound center

## 2016-01-04 NOTE — Evaluation (Signed)
Occupational Therapy Evaluation Patient Details Name: Clinton Harrington MRN: [id number] DOB: Apr 04, 1951 Today's Date: 01/04/2016    History of Present Illness Pt is a 65 yo male admitted for L fem BK pop bypass graft in attempts to salvage his L foot.  Pt with L heel wound with pain.     Clinical Impression   Pt admitted with the above diagnosis and has the deficits listed below. Pt would benefit from cont OT to increase independence with basic LE adls and adl transfers so he can safely d/c home with his mother who is available as needed to assist him.  Spoke with pt about making sure to take care of L heel and float it on pillows when in bed and monitor weight bearing only through ball of L foot.     Follow Up Recommendations  Home health OT;Supervision/Assistance - 24 hour (24 hour S just for first day or two.)    Equipment Recommendations  None recommended by OT    Recommendations for Other Services       Precautions / Restrictions Precautions Precautions: Fall Restrictions Weight Bearing Restrictions: Yes LLE Weight Bearing: Partial weight bearing LLE Partial Weight Bearing Percentage or Pounds: WB through forefoot only.      Mobility Bed Mobility Overal bed mobility: Needs Assistance Bed Mobility: Sit to Supine       Sit to supine: Mod assist   General bed mobility comments: needed assist to get feet back into bed.  Transfers Overall transfer level: Needs assistance Equipment used: Rolling walker (2 wheeled) Transfers: Sit to/from [employer] Sit to Stand: Min assist Stand pivot transfers: Min assist       General transfer comment: pt did well with forefoot weight bearing only.      Balance Overall balance assessment: Needs assistance Sitting-balance support: Feet supported Sitting balance-Leahy Scale: Good     Standing balance support: Bilateral upper extremity supported;During functional activity Standing balance-Leahy Scale:  Poor Standing balance comment: Needs outside assist to remain safely standing.                            ADL Overall ADL's : Needs assistance/impaired Eating/Feeding: Independent;Sitting   Grooming: Wash/dry hands;Wash/dry face;Oral care;Set up;Sitting   Upper Body Bathing: Set up;Sitting   Lower Body Bathing: Moderate assistance;Sit to/from stand Lower Body Bathing Details (indicate cue type and reason): Pt cannot reach lower legs at this time.  Needs min guard to stand and wash bottom.  Educated re: long sponge. Upper Body Dressing : Set up;Sitting   Lower Body Dressing: Moderate assistance;Sit to/from stand Lower Body Dressing Details (indicate cue type and reason): needs assist with starting pants over feet and when standing to pull pants up.  Educated pt re: sock aid and reacher and pt told where to get them.  Pt states mother can also assist him,. Toilet Transfer: Minimal assistance;Stand-pivot;BSC   Toileting- Architect and Hygiene: Min guard;Cueing for safety;Sit to/from stand       Functional mobility during ADLs: Minimal assistance;Rolling walker General ADL Comments: Pt did well overall. Declined trying shower transfer due to pain.  Pt has been using compensatory techniques to dress for some time now.     Vision     Perception     Praxis      Pertinent Vitals/Pain Pain Assessment: 0-10 Pain Score: 9  Pain Location: L leg Pain Descriptors / Indicators: Aching;Tightness Pain Intervention(s): Limited activity within patient's tolerance;Monitored during  session;Repositioned;Patient requesting pain meds-RN notified;Relaxation     Hand Dominance Right   Extremity/Trunk Assessment Upper Extremity Assessment Upper Extremity Assessment: Overall WFL for tasks assessed   Lower Extremity Assessment Lower Extremity Assessment: Defer to PT evaluation   Cervical / Trunk Assessment Cervical / Trunk Assessment: Normal   Communication  Communication Communication: No difficulties   Cognition Arousal/Alertness: Awake/alert Behavior During Therapy: WFL for tasks assessed/performed Overall Cognitive Status: Within Functional Limits for tasks assessed                     General Comments       Exercises       Shoulder Instructions      Home Living Family/patient expects to be discharged to:: Private residence Living Arrangements: Parent Available Help at Discharge: Available PRN/intermittently Type of Home: House Home Access: Stairs to enter [employer] of Steps: 2 Entrance Stairs-Rails: None Home Layout: One level     Bathroom Shower/Tub: Walk-in shower;Door   [employer] Toilet: Handicapped height     Home Equipment: Information systems manager;Other (comment) (walking stick)   Additional Comments: walking stick.      Prior Functioning/Environment Level of Independence: Independent with assistive device(s)        Comments: used walking stick to ambulate.  Otherwise independent.    OT Diagnosis: Generalized weakness;Acute pain   OT Problem List: Decreased strength;Impaired balance (sitting and/or standing);Decreased knowledge of use of DME or AE;Pain   OT Treatment/Interventions: Self-care/ADL training;Therapeutic activities;DME and/or AE instruction    OT Goals(Current goals can be found in the care plan section) Acute Rehab OT Goals Patient Stated Goal: to get home tomorrow OT Goal Formulation: With patient Time For Goal Achievement: 01/18/16 Potential to Achieve Goals: Good ADL Goals Pt Will Perform Lower Body Bathing: with supervision;with adaptive equipment;sit to/from stand Pt Will Perform Lower Body Dressing: with supervision;with adaptive equipment;sit to/from stand Pt Will Perform Tub/Shower Transfer: Shower transfer;shower seat;rolling walker;with supervision;ambulating Additional ADL Goal #1: Pt will walk to bathroom and toilet on high commode with S.  OT Frequency: Min 2X/week    Barriers to D/C:            Co-evaluation              End of Session Equipment Utilized During Treatment: Engineer, water Communication: Mobility status;Patient requests pain meds  Activity Tolerance: Patient tolerated treatment well Patient left: in bed;with call bell/phone within reach   Time: 1050-1115 OT Time Calculation (min): 25 min Charges:  OT General Charges $OT Visit: 1 Procedure OT Evaluation $OT Eval Moderate Complexity: 1 Procedure OT Treatments $Self Care/Home Management : 8-22 mins G-Codes:    Hope Budds 01-28-2016, 11:31 AM  [phone]

## 2016-01-05 LAB — BASIC METABOLIC PANEL
Anion gap: 8 (ref 5–15)
CO2: 27 mmol/L (ref 22–32)
CREATININE: 0.74 mg/dL (ref 0.61–1.24)
Calcium: 8.6 mg/dL — ABNORMAL LOW (ref 8.9–10.3)
Chloride: 101 mmol/L (ref 101–111)
GFR calc Af Amer: >60 mL/min (ref >60)
Glucose, Bld: 147 mg/dL — ABNORMAL HIGH (ref 65–99)
POTASSIUM: 3.2 mmol/L — AB (ref 3.5–5.1)
SODIUM: 136 mmol/L (ref 135–145)

## 2016-01-05 LAB — CBC
HEMATOCRIT: 38.2 % — AB (ref 39.0–52.0)
Hemoglobin: 12.9 g/dL — ABNORMAL LOW (ref 13.0–17.0)
MCH: 28.2 pg (ref 26.0–34.0)
MCHC: 33.8 g/dL (ref 30.0–36.0)
MCV: 83.4 fL (ref 78.0–100.0)
PLATELETS: 239 10*3/uL (ref 150–400)
RBC: 4.58 MIL/uL (ref 4.22–5.81)
RDW: 14.4 % (ref 11.5–15.5)
WBC: 10.5 10*3/uL (ref 4.0–10.5)

## 2016-01-05 LAB — GLUCOSE, CAPILLARY
GLUCOSE-CAPILLARY: 163 mg/dL — AB (ref 65–99)
Glucose-Capillary: 161 mg/dL — ABNORMAL HIGH (ref 65–99)

## 2016-01-05 MED ORDER — POTASSIUM CHLORIDE CRYS ER 20 MEQ PO TBCR
40.0000 meq | EXTENDED_RELEASE_TABLET | Freq: Once | ORAL | Status: AC
  Administered 2016-01-05: 40 meq via ORAL
  Filled 2016-01-05: qty 2

## 2016-01-05 MED ORDER — COLLAGENASE 250 UNIT/GM EX OINT: TOPICAL_OINTMENT | Freq: Every day | CUTANEOUS | Status: DC

## 2016-01-05 MED ORDER — CLOPIDOGREL BISULFATE 75 MG PO TABS: 75.0000 mg | ORAL_TABLET | Freq: Every day | ORAL | Status: DC

## 2016-01-05 NOTE — Discharge Summary (Signed)
Discharge Summary     Clinton Harrington Sep 16, 1951 65 y.o. male  [id number]  Admission Date: 01/01/2016  Discharge Date: 01/05/16  Physician: Pryor Ochoa, MD  Admission Diagnosis: pvd with left foot grengreen Peripheral vascular disease with left foot gangrene I70.262   HPI:   This is a 65 y.o. male who presents with chief complaint: painful left foot. Onset of symptom occurred two months ago, heel ulcer followed by ulcer on dorsum of foot. These wounds are associated with intermittent pain in the left fot. Pain is described as sharp, severity 5-10/10, and associated with manipulating the left foot. Patient has attempted to treat this pain with rest. The patient has intermittent rest pain symptoms. Atherosclerotic risk factors include: HLD, HTN, IDDM, and active smoking.  Hospital Course:  The patient was admitted to the hospital and taken to the [facility] lab on 01/01/2016 - findings included a >90% stenosis in the AK segment that was resolved after angioplasty.  ABI's were obtained the next day and were as follows:  RIGHT    LEFT    PRESSURE WAVEFORM  PRESSURE WAVEFORM  BRACHIAL 153 Triphasic BRACHIAL 159 Triphasic  DP 144 Triphasic DP 88 Biphasic  PT 116 Triphasic PT 69 Monophasic    RIGHT LEFT  ABI 0.91 0.55       Since they were not much improved, it was determined he would go to the operating room on 01/03/2016 by Dr. Hart Rochester and underwent: Left CFA to below knee popliteal artery bypass using non reversed translocated saphenous vein graft with intraoperative arteriogram left leg.    The pt tolerated the procedure well and was transported to the PACU in good condition.   He was requiring some prn hypertensive medication, but this did improve.  By POD 1, he ws doing well with palpable left DP pulse.  He was transferred to 2 west.  He does have eschar on the dorsum of the left foot and left heel, which are dry without drainage.  He  knows to continue to float heels to relieve pressure.    Repeat ABI's on 01/04/16 are as follows:  RIGHT    LEFT    PRESSURE WAVEFORM  PRESSURE WAVEFORM  BRACHIAL 150 Triphasic BRACHIAL 147 Triphasic  DP 163 Triphasic DP 112 Biphasic  PT 118 Triphasic PT 86 Biphasic    RIGHT LEFT  ABI 1.09 0.75       By POD 2, he was ready to go home.  He is ambulating, tolerating diet, voiding without difficulty and pain is well controlled.   Given that his wounds are dry without drainage, Dr. Hart Rochester did not feel it necessary discharge with po antibiotics.  He will be seen in the wound care center on Monday and they can further evaluate the need for antibiotics.  The remainder of the hospital course consisted of increasing mobilization and increasing intake of solids without difficulty.  CBC    Component Value Date/Time   WBC 10.5 01/05/2016 0248   RBC 4.58 01/05/2016 0248   HGB 12.9* 01/05/2016 0248   HCT 38.2* 01/05/2016 0248   PLT 239 01/05/2016 0248   MCV 83.4 01/05/2016 0248   MCH 28.2 01/05/2016 0248   MCHC 33.8 01/05/2016 0248   RDW 14.4 01/05/2016 0248   LYMPHSABS 2.2 08/07/2012 1802   MONOABS 0.5 08/07/2012 1802   EOSABS 0.2 08/07/2012 1802   BASOSABS 0.1 08/07/2012 1802    BMET    Component Value Date/Time   NA 136 01/05/2016 0248   K  3.2* 01/05/2016 0248   CL 101 01/05/2016 0248   CO2 27 01/05/2016 0248   GLUCOSE 147* 01/05/2016 0248   BUN <5* 01/05/2016 0248   CREATININE 0.74 01/05/2016 0248   CALCIUM 8.6* 01/05/2016 0248   GFRNONAA >60 01/05/2016 0248   GFRAA >60 01/05/2016 0248     Discharge Instructions    Call MD for:  redness, tenderness, or signs of infection (pain, swelling, bleeding, redness, odor or green/yellow discharge around incision site)    Complete by:  As directed      Call MD for:  severe or increased pain, loss or decreased feeling  in affected limb(s)    Complete by:  As directed      Call MD for:   temperature >100.5    Complete by:  As directed      Discharge wound care:    Complete by:  As directed   Keep left foot wrapped daily with dry dressing. Walk on forefoot only.     Driving Restrictions    Complete by:  As directed   No driving while on pain medication     Increase activity slowly    Complete by:  As directed   Walk with assistance use walker or cane as needed     Resume previous diet    Complete by:  As directed            Discharge Diagnosis:  pvd with left foot grengreen Peripheral vascular disease with left foot gangrene I70.262  Secondary Diagnosis: Patient Active Problem List   Diagnosis Date Noted  . Extremity atherosclerosis with gangrene (HCC) 01/01/2016  . S/P CABG x 4   . Dyslipidemia, goal LDL below 70   . Atherosclerosis of native arteries of the extremities with gangrene (HCC) 12/29/2015  . Ulcer of heel and midfoot (HCC) 11/09/2015  . Diabetes (HCC) 10/30/2012  . Hyperlipemia 10/30/2012  . CAD (coronary artery disease) 10/30/2012  . HTN (hypertension) 10/30/2012  . Tobacco use 10/30/2012   Past Medical History  Diagnosis Date  . Myocardial infarct (HCC)   . High cholesterol   . Arthritis   . Seasonal allergies   . Hypertension   . Urine incontinence   . Diabetes mellitus Jan. 1999  . Diabetes (HCC)        Medication List    TAKE these medications        acetaminophen 650 MG CR tablet  Commonly known as:  TYLENOL  Take 1,300 mg by mouth 2 (two) times daily.     carvedilol 3.125 MG tablet  Commonly known as:  COREG  Take 1 tablet (3.125 mg total) by mouth 2 (two) times daily with a meal.     collagenase ointment  Commonly known as:  SANTYL  Apply 1 application topically daily.     cyclobenzaprine 10 MG tablet  Commonly known as:  FLEXERIL  Take 10 mg by mouth at bedtime.     freestyle lancets  Use as instructed     freestyle lancets  Use as instructed     gabapentin 300 MG capsule  Commonly known as:  NEURONTIN    Take 900 mg by mouth 3 (three) times daily.     glimepiride 4 MG tablet  Commonly known as:  AMARYL  Take 4 mg by mouth 2 (two) times daily.     glucose blood test strip  Use as instructed     glucose blood test strip  Commonly known as:  FREESTYLE LITE  Use  as instructed     lisinopril 40 MG tablet  Commonly known as:  PRINIVIL,ZESTRIL  Take 1 tablet (40 mg total) by mouth daily.     metFORMIN 1000 MG tablet  Commonly known as:  GLUCOPHAGE  Take 1 tablet (1,000 mg total) by mouth 2 (two) times daily with a meal.     pravastatin 40 MG tablet  Commonly known as:  PRAVACHOL  Take 1 tablet (40 mg total) by mouth daily.     PRESCRIPTION MEDICATION  Inhale into the lungs at bedtime. CPAP     traMADol 50 MG tablet  Commonly known as:  ULTRAM  Take 1 tablet (50 mg total) by mouth every 12 (twelve) hours as needed.     traMADol 50 MG tablet  Commonly known as:  ULTRAM  Take 1 tablet (50 mg total) by mouth every 6 (six) hours as needed for moderate pain.       Rx for antibiotics not given as Dr. Hart Rochester feels since the wound is dry without drainage, he does not need them at this time.  He does have an appt at the wound center on Monday January 30 and they can re-evaluate at that time.  Prescriptions given: 1.  Tramadol #30 No Refill  Instructions: 1.  Wash the groin wound with soap and water daily and pat dry. (No tub bath-only shower)  Then put a dry gauze or washcloth there to keep this area dry daily and as needed.  Do not use Vaseline or neosporin on your incisions.  Only use soap and water on your incisions and then protect and keep dry.  Disposition: home with HHPT/OT  Patient's condition: is Good  Follow up: 1. Dr. Hart Rochester in 2 weeks 2. Dr. Lajoyce Corners 01/11/16 @ 1230 3. Wound Center 01/05/16   Doreatha Massed, PA-C [facility] [phone] 01/05/2016  10:42 AM  - For VQI Registry use --- Instructions: Press F2 to tab through selections.  Delete  question if not applicable.   Post-op:  Wound infection: No  Graft infection: No  Transfusion: No  If yes, n/a units given New Arrhythmia: No Ipsilateral amputation: No,  Minor,  BKA,  AKA Discharge patency: [x ] Primary,  Primary assisted,  Secondary,  Occluded Patency judged by:  Dopper only,  Palpable graft pulse,  Palpable distal pulse,  ABI inc. > 0.15,  Duplex Discharge ABI: R 1.09, L 0.75 D/C Ambulatory Status: Ambulatory  Complications: MI: No,  Troponin only,  EKG or Clinical CHF: No Resp failure:No,  Pneumonia,  Ventilator Chg in renal function: No,  Inc. Cr > 0.5,  Temp. Dialysis,  Permanent dialysis Stroke: No,  Minor,  Major Return to OR: No  Reason for return to OR:  Bleeding,  Infection,  Thrombosis,  Revision  Discharge medications: Statin use:  yes ASA use:  No-allergy Plavix use:  yes Beta blocker use: yes ACEI use:   yes ARB use:  no Coumadin use: no

## 2016-01-05 NOTE — Progress Notes (Addendum)
  Progress Note    01/05/2016 7:38 AM 2 Days Post-Op  Subjective:  Ready to go home; pain much better than before surgery  Afebrile HR 90's 120's-160's systolic 95% RA  Filed Vitals:   01/04/16 2112 01/05/16 0532  BP: 166/86 169/83  Pulse: 98 99  Temp: 98.2 F (36.8 C) 98.2 F (36.8 C)  Resp: 19 18    Physical Exam: Lungs:  Non labored Incisions:  All incisions look good Extremities:  Left foot is warm; bandage is in place   CBC    Component Value Date/Time   WBC 10.5 01/05/2016 0248   RBC 4.58 01/05/2016 0248   HGB 12.9* 01/05/2016 0248   HCT 38.2* 01/05/2016 0248   PLT 239 01/05/2016 0248   MCV 83.4 01/05/2016 0248   MCH 28.2 01/05/2016 0248   MCHC 33.8 01/05/2016 0248   RDW 14.4 01/05/2016 0248   LYMPHSABS 2.2 08/07/2012 1802   MONOABS 0.5 08/07/2012 1802   EOSABS 0.2 08/07/2012 1802   BASOSABS 0.1 08/07/2012 1802    BMET    Component Value Date/Time   NA 136 01/05/2016 0248   K 3.2* 01/05/2016 0248   CL 101 01/05/2016 0248   CO2 27 01/05/2016 0248   GLUCOSE 147* 01/05/2016 0248   BUN <5* 01/05/2016 0248   CREATININE 0.74 01/05/2016 0248   CALCIUM 8.6* 01/05/2016 0248   GFRNONAA >60 01/05/2016 0248   GFRAA >60 01/05/2016 0248    INR No results found for: INR   Intake/Output Summary (Last 24 hours) at 01/05/16 0738 Last data filed at 01/04/16 2000  Gross per 24 hour  Intake    480 ml  Output      0 ml  Net    480 ml   ABI's 01/04/16:  RIGHT    LEFT    PRESSURE WAVEFORM  PRESSURE WAVEFORM  BRACHIAL 150 Triphasic BRACHIAL 147 Triphasic  DP 163 Triphasic DP 112 Biphasic  PT 118 Triphasic PT 86 Biphasic    RIGHT LEFT  ABI 1.09 0.75         Assessment:  65 y.o. male is s/p:  left common femoral to below-knee popliteal artery bypass using non-reversed translocated saphenous vein graft with intraoperative arteriogram  2 Days Post-Op  Plan: -pt doing well this morning and wants to go  home -his pain is much better than pre-op -states he has been walking with the walker -DVT prophylaxis:  Lovenox -pt needs HHPT  -f/u with Dr. Lajoyce Corners i 1-2 weeks -needs to f/u in the wound center as well, which he is followed. -hypokalemia - supplement   Doreatha Massed, PA-C [facility] [phone] 01/05/2016 7:38 AM    Agree with above assessment 3+ dorsalis pedis pulse palpable Surgical incisions all healing satisfactorily Ulcerations left heel and dorsum left foot are stable  Patient wants badly to be discharged today. He is ambulating and tolerating diet and voiding without difficulty. Pain well controlled.  Will DC home today Follow-up and wound center on Monday See Dr. Lajoyce Corners next week Will begin aspirin

## 2016-01-05 NOTE — Progress Notes (Signed)
Pt. refused CPAP, not needing one to be set up for hospital use, told to notify if needing.

## 2016-01-05 NOTE — Care Management Note (Signed)
Case Management Note [name] RN, BSN Unit 2W-Case Manager [phone]   Patient Details  Name: Clinton Harrington MRN: [id number] Date of Birth: 1951-09-11  Subjective/Objective:     Date: 01/04/16 Spoke with patient at the bedside.  Introduced self as [name] and explained role in discharge planning and how to be reached.  Verified patient lives in town, with Mother who is 63 years old. Has a walking stick (cane), he has a cpap machine through [facility].  Expressed potential need for rolling walker, referral given to [facility] with [facility], will bring to patients room.  Verified patient anticipates to go home with mother at time of discharge and will have part-time supervision by family at this time to best of their knowledge.  Patient denied needing help with their medication.  Patient  is driven by mother if needed to MD appointments.  Verified patient has PCP Dr. Johny Blamer. Await pt/ot eval. Patient states if he needs [facility] services he would like to work with [facility].    Plan: CM will continue to follow for discharge planning and [facility] resources.                Action/Plan:   Expected Discharge Date:  01/05/16               Expected Discharge Plan:  Home w Home Health Services  In-House Referral:     Discharge planning Services  CM Consult  Post Acute Care Choice:  Durable Medical Equipment, Home Health Choice offered to:  Patient  DME Arranged:  3-N-1, Walker rollingRolling Walker , 3 n 1 DME Agency:  [facility]  HH Arranged:  PT, OTHHPT, HHOT HH Agency[facility]  [facility]  Status of Service:  Completed, signed off  Medicare Important Message Given:  Yes Date Medicare IM Given:    Medicare IM give by:    Date Additional Medicare IM Given:    Additional Medicare Important Message give by:     If discussed at Long Length of Stay Meetings, dates discussed:    Discharge Disposition: Home/home health   Additional  Comments:  01/05/16- f/u done with pt regarding d/c needs/HH- pt has decided he does not want 3n1 only wants RW for home- [name] with [facility] to deliver DME to room prior to discharge- [name] with [facility] is following for [facility] needs- have placed order for [facility] per CM referral for HH-PT- will need F2F still by MD/PA.   [name], RN 01/05/2016, 11:35 AM

## 2016-01-08 ENCOUNTER — Telehealth: Payer: Self-pay | Admitting: Vascular Surgery

## 2016-01-08 DIAGNOSIS — E11622 Type 2 diabetes mellitus with other skin ulcer: Secondary | ICD-10-CM | POA: Diagnosis not present

## 2016-01-08 DIAGNOSIS — Z7984 Long term (current) use of oral hypoglycemic drugs: Secondary | ICD-10-CM | POA: Diagnosis not present

## 2016-01-08 DIAGNOSIS — L97421 Non-pressure chronic ulcer of left heel and midfoot limited to breakdown of skin: Secondary | ICD-10-CM | POA: Diagnosis not present

## 2016-01-08 DIAGNOSIS — G4730 Sleep apnea, unspecified: Secondary | ICD-10-CM | POA: Diagnosis not present

## 2016-01-08 DIAGNOSIS — E1140 Type 2 diabetes mellitus with diabetic neuropathy, unspecified: Secondary | ICD-10-CM | POA: Diagnosis not present

## 2016-01-08 DIAGNOSIS — I2510 Atherosclerotic heart disease of native coronary artery without angina pectoris: Secondary | ICD-10-CM | POA: Diagnosis not present

## 2016-01-08 DIAGNOSIS — F17210 Nicotine dependence, cigarettes, uncomplicated: Secondary | ICD-10-CM | POA: Diagnosis not present

## 2016-01-08 DIAGNOSIS — E11621 Type 2 diabetes mellitus with foot ulcer: Secondary | ICD-10-CM | POA: Diagnosis not present

## 2016-01-08 DIAGNOSIS — Z9989 Dependence on other enabling machines and devices: Secondary | ICD-10-CM | POA: Diagnosis not present

## 2016-01-08 DIAGNOSIS — I252 Old myocardial infarction: Secondary | ICD-10-CM | POA: Diagnosis not present

## 2016-01-08 DIAGNOSIS — L97521 Non-pressure chronic ulcer of other part of left foot limited to breakdown of skin: Secondary | ICD-10-CM | POA: Diagnosis not present

## 2016-01-08 DIAGNOSIS — I10 Essential (primary) hypertension: Secondary | ICD-10-CM | POA: Diagnosis not present

## 2016-01-08 DIAGNOSIS — Z955 Presence of coronary angioplasty implant and graft: Secondary | ICD-10-CM | POA: Diagnosis not present

## 2016-01-08 NOTE — Telephone Encounter (Addendum)
-----   Message from Dara Lords, [place] sent at 01/05/2016 10:38 AM EST ----- S/p left fem pop.  F/u with Dr. Hart Rochester in 2 weeks.  Thanks, Samantha  notified patient of post op appt. on 01-23-16 at 9:15 with dr. Hart Rochester

## 2016-01-15 ENCOUNTER — Encounter (HOSPITAL_BASED_OUTPATIENT_CLINIC_OR_DEPARTMENT_OTHER): Payer: Medicare Other | Attending: Internal Medicine

## 2016-01-15 DIAGNOSIS — I252 Old myocardial infarction: Secondary | ICD-10-CM | POA: Insufficient documentation

## 2016-01-15 DIAGNOSIS — X58XXXA Exposure to other specified factors, initial encounter: Secondary | ICD-10-CM | POA: Insufficient documentation

## 2016-01-15 DIAGNOSIS — S31104A Unspecified open wound of abdominal wall, left lower quadrant without penetration into peritoneal cavity, initial encounter: Secondary | ICD-10-CM | POA: Insufficient documentation

## 2016-01-15 DIAGNOSIS — L97521 Non-pressure chronic ulcer of other part of left foot limited to breakdown of skin: Secondary | ICD-10-CM | POA: Insufficient documentation

## 2016-01-15 DIAGNOSIS — T8131XA Disruption of external operation (surgical) wound, not elsewhere classified, initial encounter: Secondary | ICD-10-CM | POA: Insufficient documentation

## 2016-01-15 DIAGNOSIS — Z955 Presence of coronary angioplasty implant and graft: Secondary | ICD-10-CM | POA: Insufficient documentation

## 2016-01-15 DIAGNOSIS — E11621 Type 2 diabetes mellitus with foot ulcer: Secondary | ICD-10-CM | POA: Insufficient documentation

## 2016-01-15 DIAGNOSIS — E1140 Type 2 diabetes mellitus with diabetic neuropathy, unspecified: Secondary | ICD-10-CM | POA: Insufficient documentation

## 2016-01-15 DIAGNOSIS — I2510 Atherosclerotic heart disease of native coronary artery without angina pectoris: Secondary | ICD-10-CM | POA: Insufficient documentation

## 2016-01-15 DIAGNOSIS — G4730 Sleep apnea, unspecified: Secondary | ICD-10-CM | POA: Insufficient documentation

## 2016-01-15 DIAGNOSIS — I10 Essential (primary) hypertension: Secondary | ICD-10-CM | POA: Insufficient documentation

## 2016-01-15 DIAGNOSIS — L97421 Non-pressure chronic ulcer of left heel and midfoot limited to breakdown of skin: Secondary | ICD-10-CM | POA: Insufficient documentation

## 2016-01-16 ENCOUNTER — Encounter: Payer: Self-pay | Admitting: Vascular Surgery

## 2016-01-17 ENCOUNTER — Telehealth: Payer: Self-pay | Admitting: Vascular Surgery

## 2016-01-17 NOTE — Telephone Encounter (Signed)
Clinton Harrington, physical therapist with [facility] called to report that Clinton Harrington left 5th toe was black.  Last week, it was pink with some blackening on the medial aspect.  She offered an assessment by an [facility] nurse, but the patient declined, stating he had an appointment with our office next week.  Clinton Harrington, additionally, reported pain at the incision; no drainage, and no additional foot pain.  She reported that he is not using his walker (despite an order for partial weight bearing) and smoking more than he had shortly after discharge from the hospital.  Dr. Hart Rochester was consulted.  He reviewed his notes and said that the toe should be examined since the change was acute.  The patient was given an appointment with Dr. Darrick Penna for Thursday, 2/9, to arrive at 2:30 for a 2:45 appointment.  He said that he plans to come and will call our office in the morning after 9 if he cannot. Clinton Harrington

## 2016-01-18 ENCOUNTER — Encounter: Payer: Self-pay | Admitting: Vascular Surgery

## 2016-01-18 ENCOUNTER — Ambulatory Visit (HOSPITAL_COMMUNITY)
Admission: RE | Admit: 2016-01-18 | Discharge: 2016-01-18 | Disposition: A | Discharge status: Home / Self Care | Payer: Medicare Other | Source: Ambulatory Visit | Attending: Vascular Surgery | Admitting: Vascular Surgery

## 2016-01-18 ENCOUNTER — Ambulatory Visit (INDEPENDENT_AMBULATORY_CARE_PROVIDER_SITE_OTHER): Payer: Medicare Other | Admitting: Vascular Surgery

## 2016-01-18 VITALS — BP 172/98 | HR 99 | Temp 97.3°F | Resp 16 | Ht 67.0 in | Wt 196.0 lb

## 2016-01-18 DIAGNOSIS — I739 Peripheral vascular disease, unspecified: Secondary | ICD-10-CM

## 2016-01-18 DIAGNOSIS — Z95828 Presence of other vascular implants and grafts: Secondary | ICD-10-CM | POA: Diagnosis not present

## 2016-01-18 MED ORDER — LEVOFLOXACIN 500 MG PO TABS: 500.0000 mg | ORAL_TABLET | Freq: Every day | ORAL | Status: DC

## 2016-01-18 NOTE — Progress Notes (Signed)
[facility] Progress Note  HPI- Clinton Harrington is a 65 yo male s/p left common femoral to below-knee popliteal artery bypass using non-reversed translocated saphenous vein graft with intraoperative arteriogram on 01/03/2016 with Dr. Hart Rochester. He has had progressive blackness of his left fifth toe. States that it was black at the tip of the toe when he was discharged from the hospital on 01/05/2016 and it began progressing down the entire toe on 01/12/2016. States that he has no left foot pain and the left fifth toe has no sensation in it. Denies fever, chills, N/V, chest pain and SOB.  [facility] of ulcerations on the heel and dorsal aspect of left foot. He also goes to physical therapy with [facility].  Objective Filed Vitals:   01/18/16 1438 01/18/16 1442  BP: 177/99 172/98  Pulse: 99 99  Temp: 97.3 F (36.3 C)   Resp: 16    Vascular: Nonpalpable DP/PT pulses on left foot however positive doppler sounds Extremities: Ulcerations present on heel and dorsal surface of left foot. Ulcerations are yellowish-white in coloration, odorous, no bleeding noted. Left fifth toe is black and gangrenous. Other toes are pink and warm. Has minimal range of motion of left leg/foot/toes. There is 2+ edema in left leg.   Incisions: Incision in left groin has maceration; there is serous drainage present on the most distal incision on left leg; Other incisions are dry and intact; all incisions have slight erythema surrounding them.  Data Entry:  Venous Bypass Duplex was performed in office today (01/18/2016) due to changes in toe and incisions. Showed that bypass is patent although technically limited by edema  Assessment/Planning: 65 y.o. male is s/p: left common femoral to below-knee popliteal artery bypass using non-reversed translocated saphenous vein graft with intraoperative arteriogram on 01/03/2016 with Dr. Hart Rochester.  Informed  patient to elevate legs when not mobile and to be partial weightbearing on left leg by using rolling walker and only the ball of foot.   Patient to wash incisions with antibiotic soap (Dial) daily, dry with towel and to keep gauze over groin incision in order to keep it dry.  Dr. Darrick Penna is prescribing Levoquine 500 daily x 7 days for possible infection.  Patient to keep f/u appointment with Dr. Hart Rochester on 01/23/2016 to further discuss plan with toe and incision sites.   Clinton Harrington 01/18/2016 3:27 PM --  History and exam findings as above. Patient has some maceration of his left groin. Other wounds are slowly healing. His leg is fairly edematous. It was difficult to palpate a popliteal pulse today he did have brisk Doppler flow in his feet appeared to have a patent graft by duplex exam. He has dry gangrenous changes of the left fifth toe may eventually need amputation of this but currently he is not experiencing any significant pain. Hopefully the wounds on his foot will continue to heal over time. The patient has scheduled follow-up with Dr. Hart Rochester early next week for consideration of removal of the toe and also continued follow-up of his left groin wound which was fairly marginal on appearance today.  Clinton Bruns, MD [facility] [place] Office: [phone] Pager: [phone]   Laboratory CBC    Component Value Date/Time   WBC 10.5 01/05/2016 0248   HGB 12.9* 01/05/2016 0248   HCT 38.2* 01/05/2016 0248   PLT 239 01/05/2016 0248    BMET    Component Value Date/Time  NA 136 01/05/2016 0248   K 3.2* 01/05/2016 0248   CL 101 01/05/2016 0248   CO2 27 01/05/2016 0248   GLUCOSE 147* 01/05/2016 0248   BUN <5* 01/05/2016 0248   CREATININE 0.74 01/05/2016 0248   CALCIUM 8.6* 01/05/2016 0248   GFRNONAA >60 01/05/2016 0248   GFRAA >60 01/05/2016 0248   Antibiotics Anti-infectives    None       Clinton Checkovich, PA-S  01/18/2016 3:27  PM

## 2016-01-18 NOTE — Progress Notes (Signed)
Filed Vitals:   01/18/16 1438 01/18/16 1442  BP: 177/99 172/98  Pulse: 99 99  Temp: 97.3 F (36.3 C)   Resp: 16   Height:  (1.702 m)   Weight: 196 lb (88.905 kg)   SpO2: 97%

## 2016-01-23 ENCOUNTER — Encounter: Payer: Self-pay | Admitting: Vascular Surgery

## 2016-01-23 ENCOUNTER — Ambulatory Visit (INDEPENDENT_AMBULATORY_CARE_PROVIDER_SITE_OTHER): Payer: Medicare Other | Admitting: Vascular Surgery

## 2016-01-23 VITALS — BP 140/89 | HR 94 | Temp 97.4°F | Ht 67.0 in | Wt 196.0 lb

## 2016-01-23 DIAGNOSIS — I739 Peripheral vascular disease, unspecified: Secondary | ICD-10-CM

## 2016-01-23 NOTE — Progress Notes (Signed)
Subjective:     Patient ID: Clinton Harrington, male   DOB: 11-12-1951, 65 y.o.   MRN: [id number]  HPI this 65 year old male returns 2 weeks post left femoral-popliteal bypass graft for limb salvage. He had ischemic ulcers in the left foot including a gangrenous left fifth toe, heel ulcer, and an ulceration on the anterior aspect of the foot. This is all improved slightly. He has developed purulent drainage with foul smelling left inguinal wound. Patient does not have support at home to help with dressing changes. He lives alone with his mother who has dementia. He is diabetic. He did start on antibiotics a few days ago   Review of Systems     Objective:   Physical Exam BP 140/89 mmHg  Pulse 94  Temp(Src) 97.4 F (36.3 C) (Oral)  Ht  (1.702 m)  Wt 196 lb (88.905 kg)  BMI 30.69 kg/m2  SpO2 100%  General chronically ill-appearing male in no apparent distress alert and oriented 3 Left inguinal wound has skin necrosis with foul smelling drainage. This was debrided sharply with fat necrosis excised. Vein graft is not exposed. There is no synthetic material in this wound. It was packed with a moist saline gauze. The distal incision and the calf also has some dry skin gangrene at the skin edges which did not require debridement. The heel ulcer was sharply debrided also. There is some granulation tissue on the ulcer on the anterior aspect of the foot which is about to have 7 m in diameter. Sterile dressings were applied over all of these. The bypass graft is patent with good flow and anterior tibial artery.     Assessment:     Patent left femoral-popliteal bypass graft-saphenous vein for limb salvage with multiple ulcerations as described above Patient needs home health nurse daily to pack these wounds and cover them appropriately and to continue on oral antibiotics    Plan:     We'll return in 1 week for further wound examination. If he is not getting adequate dressing changes at home he  may require hospitalization but we will try this as an outpatient with home health support

## 2016-01-25 ENCOUNTER — Other Ambulatory Visit: Payer: Self-pay | Admitting: Internal Medicine

## 2016-01-25 ENCOUNTER — Ambulatory Visit (HOSPITAL_COMMUNITY)
Admission: RE | Admit: 2016-01-25 | Discharge: 2016-01-25 | Disposition: A | Discharge status: Home / Self Care | Payer: Medicare Other | Source: Ambulatory Visit | Attending: Internal Medicine | Admitting: Internal Medicine

## 2016-01-25 DIAGNOSIS — G4730 Sleep apnea, unspecified: Secondary | ICD-10-CM | POA: Diagnosis not present

## 2016-01-25 DIAGNOSIS — L97521 Non-pressure chronic ulcer of other part of left foot limited to breakdown of skin: Secondary | ICD-10-CM | POA: Diagnosis not present

## 2016-01-25 DIAGNOSIS — L97409 Non-pressure chronic ulcer of unspecified heel and midfoot with unspecified severity: Secondary | ICD-10-CM | POA: Diagnosis present

## 2016-01-25 DIAGNOSIS — I10 Essential (primary) hypertension: Secondary | ICD-10-CM | POA: Diagnosis not present

## 2016-01-25 DIAGNOSIS — Z955 Presence of coronary angioplasty implant and graft: Secondary | ICD-10-CM | POA: Diagnosis not present

## 2016-01-25 DIAGNOSIS — E119 Type 2 diabetes mellitus without complications: Secondary | ICD-10-CM | POA: Insufficient documentation

## 2016-01-25 DIAGNOSIS — M86172 Other acute osteomyelitis, left ankle and foot: Secondary | ICD-10-CM

## 2016-01-25 DIAGNOSIS — E1140 Type 2 diabetes mellitus with diabetic neuropathy, unspecified: Secondary | ICD-10-CM | POA: Diagnosis not present

## 2016-01-25 DIAGNOSIS — T8131XA Disruption of external operation (surgical) wound, not elsewhere classified, initial encounter: Secondary | ICD-10-CM | POA: Diagnosis not present

## 2016-01-25 DIAGNOSIS — I2510 Atherosclerotic heart disease of native coronary artery without angina pectoris: Secondary | ICD-10-CM | POA: Diagnosis not present

## 2016-01-25 DIAGNOSIS — S31104A Unspecified open wound of abdominal wall, left lower quadrant without penetration into peritoneal cavity, initial encounter: Secondary | ICD-10-CM | POA: Diagnosis not present

## 2016-01-25 DIAGNOSIS — I252 Old myocardial infarction: Secondary | ICD-10-CM | POA: Diagnosis not present

## 2016-01-25 DIAGNOSIS — L97421 Non-pressure chronic ulcer of left heel and midfoot limited to breakdown of skin: Secondary | ICD-10-CM | POA: Diagnosis not present

## 2016-01-25 DIAGNOSIS — E11621 Type 2 diabetes mellitus with foot ulcer: Secondary | ICD-10-CM | POA: Diagnosis present

## 2016-01-25 DIAGNOSIS — X58XXXA Exposure to other specified factors, initial encounter: Secondary | ICD-10-CM | POA: Diagnosis not present

## 2016-01-26 ENCOUNTER — Telehealth: Payer: Self-pay

## 2016-01-26 NOTE — Telephone Encounter (Signed)
Phone call returned to pt. following voice message left 2/16, with report of increased pain in left foot and intermittent bleeding since debridement done in office on 2/14.  Pt. Stated he went to the [facility] yesterday afternoon, and they treated his foot, and is to return on 2/23.  Stated the bleeding has stopped.  Also reported he is taking Tramadol for pain, and has had to increase to 2 tabs due to increased pain since the debridement.  Pt. Is allergic to Oxycodone; reported it makes him very nauseated.  Stated "it doesn't help anyway."  Advised will not be able to give him anything stronger than Tramadol with his allergy to Oxycodone.  Stated he has enough Tramadol to get through until his appt. On 2/20 with Dr. Hart Rochester.  Advised if his foot bleeding worsens or if his pain becomes unmanageable over weekend, he should go to the ER.  Verb. Understanding.

## 2016-01-29 ENCOUNTER — Ambulatory Visit (INDEPENDENT_AMBULATORY_CARE_PROVIDER_SITE_OTHER): Payer: Medicare Other | Admitting: Vascular Surgery

## 2016-01-29 ENCOUNTER — Encounter: Payer: Self-pay | Admitting: Vascular Surgery

## 2016-01-29 VITALS — BP 141/89 | HR 89 | Temp 97.0°F | Resp 16

## 2016-01-29 DIAGNOSIS — I739 Peripheral vascular disease, unspecified: Secondary | ICD-10-CM

## 2016-01-29 NOTE — Progress Notes (Signed)
BP 141/89 mmHg  Pulse 89  Temp(Src) 97 F (36.1 C) (Oral)  Resp 16  SpO2 100%

## 2016-01-29 NOTE — Progress Notes (Signed)
Subjective:     Patient ID: Clinton Harrington, male   DOB: 1951-11-05, 65 y.o.   MRN: [id number]  HPI this 65 year old male returns 3 weeks post left femoral-popliteal bypass graft for limb salvage with ischemic ulcers left foot and left heel ulcer. Patient seen 1 week ago with necrotic wound in left inguinal area which was sharply debrided and packed open. Patient is having home health visits daily to pack inguinal wound and also to dress ulcerations left foot. Bypass has been patent. He is on oral antibiotics. Home health has only been coming twice weekly. Patient has been to the wound center once weekly.   Review of Systems     Objective:   Physical Exam BP 141/89 mmHg  Pulse 89  Temp(Src) 97 F (36.1 C) (Oral)  Resp 16  SpO2 100%  General chronically ill-appearing male no apparent distress alert and oriented 3 Left inguinal wound is much cleaner than last week. It was sharply debrided slightly but is beginning to granulate. The dry eschar in the below-knee popliteal exposure wound will need debridement at some point but is too firm today. No cellulitis. Ulcer on dorsum of foot is healing nicely. He'll ulcer was sharply debrided. Dry gangrene of fifth toe is stable with no infection.     Assessment:     Wounds are slowly healing left leg with patent femoral popliteal saphenous vein graft    Plan:     Patient needs home health nurse to come daily to pack wounds-5 days a week Continue weekly visits to wound center Return to see me in 2 weeks  tramadol 50 mg tablets #40 given prescription today

## 2016-01-30 ENCOUNTER — Telehealth: Payer: Self-pay | Admitting: *Deleted

## 2016-01-30 NOTE — Telephone Encounter (Signed)
I called [facility] to inform them of the new wound care orders from Dr Hart Rochester.  Cleanse the wounds, left groin. Left heel and left lower leg.  Pack the left groin wound with 4x4 saline wet to dry and the heel with 2x2 saline wet to dry and also the left lower leg.  This was ordered to be daily.  Per Corrie Dandy the home health will be only every other day.  She offered a CMA to see patient on the days that the nurse would not be there, however they could only observe the patient or help with household duties, they are not able to remove dressings.  I once again explained the importance of daily wound care and she states the nurse will access the wounds. Again, I explained the size of the groin wound. She said that the nurse would assess the wounds.   I told her that Dr Hart Rochester had accessed the wounds yesterday 01/29/16 and determined they needed daily changes. I will call the patient to see if anyone could be taught to do the wound care on the days when the nurse is not there.

## 2016-02-01 DIAGNOSIS — E11621 Type 2 diabetes mellitus with foot ulcer: Secondary | ICD-10-CM | POA: Diagnosis not present

## 2016-02-06 ENCOUNTER — Encounter: Payer: Self-pay | Admitting: Vascular Surgery

## 2016-02-08 ENCOUNTER — Encounter (HOSPITAL_BASED_OUTPATIENT_CLINIC_OR_DEPARTMENT_OTHER): Payer: Medicare Other | Attending: Internal Medicine

## 2016-02-08 DIAGNOSIS — L97821 Non-pressure chronic ulcer of other part of left lower leg limited to breakdown of skin: Secondary | ICD-10-CM | POA: Insufficient documentation

## 2016-02-08 DIAGNOSIS — Z955 Presence of coronary angioplasty implant and graft: Secondary | ICD-10-CM | POA: Insufficient documentation

## 2016-02-08 DIAGNOSIS — I2510 Atherosclerotic heart disease of native coronary artery without angina pectoris: Secondary | ICD-10-CM | POA: Insufficient documentation

## 2016-02-08 DIAGNOSIS — Y838 Other surgical procedures as the cause of abnormal reaction of the patient, or of later complication, without mention of misadventure at the time of the procedure: Secondary | ICD-10-CM | POA: Insufficient documentation

## 2016-02-08 DIAGNOSIS — L97422 Non-pressure chronic ulcer of left heel and midfoot with fat layer exposed: Secondary | ICD-10-CM | POA: Insufficient documentation

## 2016-02-08 DIAGNOSIS — E11621 Type 2 diabetes mellitus with foot ulcer: Secondary | ICD-10-CM | POA: Insufficient documentation

## 2016-02-08 DIAGNOSIS — G4730 Sleep apnea, unspecified: Secondary | ICD-10-CM | POA: Insufficient documentation

## 2016-02-08 DIAGNOSIS — T8131XA Disruption of external operation (surgical) wound, not elsewhere classified, initial encounter: Secondary | ICD-10-CM | POA: Insufficient documentation

## 2016-02-08 DIAGNOSIS — I10 Essential (primary) hypertension: Secondary | ICD-10-CM | POA: Insufficient documentation

## 2016-02-08 DIAGNOSIS — F17200 Nicotine dependence, unspecified, uncomplicated: Secondary | ICD-10-CM | POA: Insufficient documentation

## 2016-02-08 DIAGNOSIS — E1140 Type 2 diabetes mellitus with diabetic neuropathy, unspecified: Secondary | ICD-10-CM | POA: Insufficient documentation

## 2016-02-08 DIAGNOSIS — I252 Old myocardial infarction: Secondary | ICD-10-CM | POA: Insufficient documentation

## 2016-02-08 DIAGNOSIS — L97521 Non-pressure chronic ulcer of other part of left foot limited to breakdown of skin: Secondary | ICD-10-CM | POA: Insufficient documentation

## 2016-02-08 DIAGNOSIS — X58XXXA Exposure to other specified factors, initial encounter: Secondary | ICD-10-CM | POA: Insufficient documentation

## 2016-02-12 DIAGNOSIS — I252 Old myocardial infarction: Secondary | ICD-10-CM | POA: Diagnosis not present

## 2016-02-12 DIAGNOSIS — L97521 Non-pressure chronic ulcer of other part of left foot limited to breakdown of skin: Secondary | ICD-10-CM | POA: Diagnosis not present

## 2016-02-12 DIAGNOSIS — Y838 Other surgical procedures as the cause of abnormal reaction of the patient, or of later complication, without mention of misadventure at the time of the procedure: Secondary | ICD-10-CM | POA: Diagnosis not present

## 2016-02-12 DIAGNOSIS — F17200 Nicotine dependence, unspecified, uncomplicated: Secondary | ICD-10-CM | POA: Diagnosis not present

## 2016-02-12 DIAGNOSIS — G4730 Sleep apnea, unspecified: Secondary | ICD-10-CM | POA: Diagnosis not present

## 2016-02-12 DIAGNOSIS — E1140 Type 2 diabetes mellitus with diabetic neuropathy, unspecified: Secondary | ICD-10-CM | POA: Diagnosis not present

## 2016-02-12 DIAGNOSIS — I10 Essential (primary) hypertension: Secondary | ICD-10-CM | POA: Diagnosis not present

## 2016-02-12 DIAGNOSIS — L97821 Non-pressure chronic ulcer of other part of left lower leg limited to breakdown of skin: Secondary | ICD-10-CM | POA: Diagnosis not present

## 2016-02-12 DIAGNOSIS — X58XXXA Exposure to other specified factors, initial encounter: Secondary | ICD-10-CM | POA: Diagnosis not present

## 2016-02-12 DIAGNOSIS — Z955 Presence of coronary angioplasty implant and graft: Secondary | ICD-10-CM | POA: Diagnosis not present

## 2016-02-12 DIAGNOSIS — L97422 Non-pressure chronic ulcer of left heel and midfoot with fat layer exposed: Secondary | ICD-10-CM | POA: Diagnosis not present

## 2016-02-12 DIAGNOSIS — E11621 Type 2 diabetes mellitus with foot ulcer: Secondary | ICD-10-CM | POA: Diagnosis not present

## 2016-02-12 DIAGNOSIS — T8131XA Disruption of external operation (surgical) wound, not elsewhere classified, initial encounter: Secondary | ICD-10-CM | POA: Diagnosis not present

## 2016-02-12 DIAGNOSIS — I2510 Atherosclerotic heart disease of native coronary artery without angina pectoris: Secondary | ICD-10-CM | POA: Diagnosis not present

## 2016-02-13 ENCOUNTER — Encounter: Payer: Self-pay | Admitting: Vascular Surgery

## 2016-02-13 ENCOUNTER — Ambulatory Visit (INDEPENDENT_AMBULATORY_CARE_PROVIDER_SITE_OTHER): Payer: Medicare Other | Admitting: Vascular Surgery

## 2016-02-13 VITALS — BP 167/93 | HR 105 | Temp 96.9°F | Resp 18 | Ht 67.0 in | Wt 189.0 lb

## 2016-02-13 DIAGNOSIS — I70244 Atherosclerosis of native arteries of left leg with ulceration of heel and midfoot: Secondary | ICD-10-CM

## 2016-02-13 NOTE — Progress Notes (Signed)
Patient name: Clinton HammockReginald E Chason MRN: [id number] DOB: [birth year] Sex: male  REASON FOR VISIT: Wound check  HPI: Clinton Harrington is a 65 y.o. male who presents for 2 week follow-up of his slowly healing left leg wounds. He is status post left femoral to below-knee popliteal bypass with saphenous vein on 01/03/2016. He was last seen in the office on 01/29/2016, where Dr. Hart RochesterLawson sharply debrided his left inguinal wound. At that time, his below knee popliteal exposure wound had dry eschar needed some debridement as well. The patient has been going to the wound center once a week. He has also been receiving home health dressing changes for his open incision and foot ulcers.  The patient went to the wound center yesterday and had his left below-knee popliteal incision debrided. The patient feels that his wounds are improving. He has finished his antibiotics. He denies any fever, chills or purulent drainage.  Current Outpatient Prescriptions  Medication Sig Dispense Refill  . acetaminophen (TYLENOL) 650 MG CR tablet Take 1,300 mg by mouth 2 (two) times daily.    . carvedilol (COREG) 3.125 MG tablet Take 1 tablet (3.125 mg total) by mouth 2 (two) times daily with a meal. 60 tablet 3  . clopidogrel (PLAVIX) 75 MG tablet Take 1 tablet (75 mg total) by mouth daily. 30 tablet 6  . collagenase (SANTYL) ointment Apply 1 application topically daily. 15 g 0  . cyclobenzaprine (FLEXERIL) 10 MG tablet Take 10 mg by mouth at bedtime.    . gabapentin (NEURONTIN) 300 MG capsule Take 900 mg by mouth 3 (three) times daily.   0  . glimepiride (AMARYL) 4 MG tablet Take 4 mg by mouth 2 (two) times daily.    Marland Kitchen. glucose blood (FREESTYLE LITE) test strip Use as instructed 100 each 12  . glucose blood test strip Use as instructed 100 each 12  . Lancets (FREESTYLE) lancets Use as instructed 100 each 12  . lisinopril (PRINIVIL,ZESTRIL) 40 MG tablet Take 1 tablet (40 mg total) by mouth daily. 90 tablet 3  . metFORMIN  (GLUCOPHAGE) 1000 MG tablet Take 1 tablet (1,000 mg total) by mouth 2 (two) times daily with a meal. 180 tablet 3  . pravastatin (PRAVACHOL) 40 MG tablet Take 1 tablet (40 mg total) by mouth daily. 90 tablet 3  . PRESCRIPTION MEDICATION Inhale into the lungs at bedtime. CPAP    . traMADol (ULTRAM) 50 MG tablet Take 1 tablet (50 mg total) by mouth every 6 (six) hours as needed for moderate pain. 30 tablet 0  . levofloxacin (LEVAQUIN) 500 MG tablet Take 1 tablet (500 mg total) by mouth daily. (Patient not taking: Reported on 02/13/2016) 7 tablet 1  . traMADol (ULTRAM) 50 MG tablet Take 1 tablet (50 mg total) by mouth every 12 (twelve) hours as needed. (Patient not taking: Reported on 02/13/2016) 30 tablet 0   No current facility-administered medications for this visit.    REVIEW OF SYSTEMS:  [X]  denotes positive finding, [ ]  denotes negative finding Cardiac  Comments:  Chest pain or chest pressure:    Shortness of breath upon exertion:    Short of breath when lying flat:    Irregular heart rhythm:    Constitutional    Fever or chills:      PHYSICAL EXAM: Filed Vitals:   02/13/16 1425 02/13/16 1427  BP: 172/92 167/93  Pulse: 92 105  Temp: 96.9 F (36.1 C)   Resp: 18   Height: 5\' 7"  (1.702 m)  Weight: 189 lb (85.73 kg)   SpO2: 92%     GENERAL: The patient is an ill-appearing male in no acute distress. VASCULAR: Brisk biphasic dorsalis pedis Doppler flow. Left inguinal incision with red granulation tissue. Below knee popliteal incision with yellow fibrinous tissue and active bleeding. The wound on left dorsum of foot with good granulation tissue. Left heel ulcer with pink granulation tissue.   MEDICAL ISSUES: Status post left femoral to popliteal bypass  The patient's left inguinal wound is healing well. His below knee popliteal wound was sharply debrided yesterday at the wound center. The ulcer on the dorsum of his left foot and heel are healing nicely. He has strong Doppler flow to  his left foot. He will continue wet-to-dry dressings to his open wounds and continue going to the wound center. He is receiving home health for dressing changes. Discussed that he needs to continue to float his left heel. He will follow-up in 4 weeks.  Maris Berger, PA-C [facility]   agree with above assessment Bypass is patent with brisk flow and foot  Wounds are slowly healing Most difficult area will be heel ulcer which measures about 2-1/2 cm in diameter and is on dependent position   We'll see back in 4 weeks

## 2016-02-13 NOTE — Progress Notes (Signed)
Filed Vitals:   02/13/16 1425 02/13/16 1427  BP: 172/92 167/93  Pulse: 92 105  Temp: 96.9 F (36.1 C)   Resp: 18   Height: 5\' 7"  (1.702 m)   Weight: 189 lb (85.73 kg)   SpO2: 92%

## 2016-02-23 ENCOUNTER — Telehealth: Payer: Self-pay

## 2016-02-23 NOTE — Telephone Encounter (Signed)
Phone call from pt. with request for refill on Tramadol.  Reported he is using Tramadol 2 tabs / day usually, and will take an additional tab when he goes to MD appts.  Stated the pain is mostly in the left lower leg wound, and for tingling sensation in leg.  Home Health RN was at pt's house at time of phone call.  HH RN reported left groin wound is healing nicely.  Reported the left leg wound did have eschar; now reports slough in wound following the debridement that was done; stated the wound healing is progressing with current treatment.  Denied fever/ chills.  Encouraged pt. to take OTC analgesic (Tylenol or Ibuprofen) during day, and to save the Tramadol for night time pain control.  Offered pt. An appt. With nurse practitioner today, to be assessed for need of pain medication.  Pt. Declined appt.  Stated he will try the OTC pain medications and will call office on Monday, if not working.

## 2016-02-26 DIAGNOSIS — E11621 Type 2 diabetes mellitus with foot ulcer: Secondary | ICD-10-CM | POA: Diagnosis not present

## 2016-02-27 ENCOUNTER — Telehealth: Payer: Self-pay | Admitting: *Deleted

## 2016-02-27 NOTE — Telephone Encounter (Signed)
Patient had called in to Triage.  LMTCB

## 2016-03-01 ENCOUNTER — Encounter: Payer: Self-pay | Admitting: Family

## 2016-03-01 ENCOUNTER — Encounter: Payer: Self-pay | Admitting: Vascular Surgery

## 2016-03-04 ENCOUNTER — Encounter: Payer: Self-pay | Admitting: Family

## 2016-03-04 ENCOUNTER — Ambulatory Visit (INDEPENDENT_AMBULATORY_CARE_PROVIDER_SITE_OTHER): Payer: Medicare Other | Admitting: Family

## 2016-03-04 VITALS — BP 149/88 | HR 78 | Temp 98.3°F | Resp 18 | Ht 67.0 in | Wt 194.4 lb

## 2016-03-04 DIAGNOSIS — I70244 Atherosclerosis of native arteries of left leg with ulceration of heel and midfoot: Secondary | ICD-10-CM

## 2016-03-04 DIAGNOSIS — E11621 Type 2 diabetes mellitus with foot ulcer: Secondary | ICD-10-CM | POA: Diagnosis not present

## 2016-03-04 DIAGNOSIS — Z95828 Presence of other vascular implants and grafts: Secondary | ICD-10-CM

## 2016-03-04 DIAGNOSIS — G8918 Other acute postprocedural pain: Secondary | ICD-10-CM

## 2016-03-04 MED ORDER — TRAMADOL HCL 50 MG PO TABS: ORAL_TABLET | ORAL | Status: DC

## 2016-03-04 NOTE — Progress Notes (Signed)
Filed Vitals:   03/04/16 0948 03/04/16 0952  BP: 143/86 149/88  Pulse: 78   Temp: 98.3 F (36.8 C)   TempSrc: Oral   Resp: 18   Height: 5\' 7"  (1.702 m)   Weight: 194 lb 6.4 oz (88.179 kg)   SpO2: 98%

## 2016-03-04 NOTE — Progress Notes (Signed)
    Postoperative Visit   History of Present Illness  Clinton HammockReginald E Harrington is a 65 y.o. year old male patient of Dr. Hart RochesterLawson who presents requesting a refill of his tramadol prescription. He was last seen by Dr. Hart RochesterLawson and Karsten RoKim Trinh, PA-C on 02-13-16 for 2 weeks follow up of his slowly healing left leg wounds. At that time the patient's left inguinal wound was healing well. His below knee popliteal wound was sharply debrided the day before at the wound center. The ulcer on the dorsum of his left foot and heel were healing nicely. He had strong Doppler flow to his left foot. He was continue wet-to-dry dressings to his open wounds and continue going to the wound center. He is receiving home health for dressing changes. Discussed that he needs to continue to float his left heel. He will follow-up in 4 weeks.   He is status post left femoral to below-knee popliteal bypass with saphenous vein on 01/03/2016.  On 01/29/2016 Dr. Hart RochesterLawson sharply debrided his left inguinal wound. At that time, his below knee popliteal exposure wound had dry eschar needed some debridement as well. The patient has been going to the wound center once a week. He has also been receiving home health dressing changes for his open incision and foot ulcers.  The patient attends the wound center and had his left below-knee popliteal incision debrided. The patient feels that his wounds are improving. He has finished his antibiotics. He denies any fever, chills or purulent drainage   The patient's wounds are healing.  The patient notes resolution of lower extremity symptoms.  The patient is able to complete their activities of daily living.     For VQI Use Only  PRE-ADM LIVING: Home  AMB STATUS: Ambulatory  Physical Examination  Filed Vitals:   03/04/16 0948 03/04/16 0952  BP: 143/86 149/88  Pulse: 78   Temp: 98.3 F (36.8 C)   TempSrc: Oral   Resp: 18   Height: 5\' 7"  (1.702 m)   Weight: 194 lb 6.4 oz (88.179 kg)   SpO2: 98%      Body mass index is 30.44 kg/(m^2).  LLE: Incisions are healed except for the open and granulating part of the lower leg incision, presurgical ischemic wounds are granulating, pedal pulses are audible by Doppler.  Medical Decision Making  Clinton HammockReginald E Harrington is a 65 y.o. year old male who presents s/p left femoral to below-knee popliteal bypass with saphenous vein on 01/03/2016.   The patient's bypass incisions are healing appropriately with resolution of pre-operative symptoms, with help of the wound care center, daily dressing changes by pt at home, [facility]  Reordered tramadol 50 mg tablet, take 1-2 tablets by mouth every 6 hours prn pain, disp #30, 0 refills.  Follow up as already scheduled with Dr. Hart RochesterLawson on 03/12/16.  Thank you for allowing us to participate in this patient's care.  [name], Carma LairSUZANNE L, RN, MSN, FNP-C [facility] [place] Office: [phone]  03/04/2016, 10:02 AM  Clinic MD: Myra GianottiBrabham

## 2016-03-11 ENCOUNTER — Encounter (HOSPITAL_BASED_OUTPATIENT_CLINIC_OR_DEPARTMENT_OTHER): Payer: Medicare Other | Attending: Internal Medicine

## 2016-03-11 DIAGNOSIS — E1140 Type 2 diabetes mellitus with diabetic neuropathy, unspecified: Secondary | ICD-10-CM | POA: Insufficient documentation

## 2016-03-11 DIAGNOSIS — L97521 Non-pressure chronic ulcer of other part of left foot limited to breakdown of skin: Secondary | ICD-10-CM | POA: Diagnosis not present

## 2016-03-11 DIAGNOSIS — Y838 Other surgical procedures as the cause of abnormal reaction of the patient, or of later complication, without mention of misadventure at the time of the procedure: Secondary | ICD-10-CM | POA: Diagnosis not present

## 2016-03-11 DIAGNOSIS — L97321 Non-pressure chronic ulcer of left ankle limited to breakdown of skin: Secondary | ICD-10-CM | POA: Diagnosis not present

## 2016-03-11 DIAGNOSIS — G4730 Sleep apnea, unspecified: Secondary | ICD-10-CM | POA: Insufficient documentation

## 2016-03-11 DIAGNOSIS — E11621 Type 2 diabetes mellitus with foot ulcer: Secondary | ICD-10-CM | POA: Insufficient documentation

## 2016-03-11 DIAGNOSIS — I252 Old myocardial infarction: Secondary | ICD-10-CM | POA: Diagnosis not present

## 2016-03-11 DIAGNOSIS — I10 Essential (primary) hypertension: Secondary | ICD-10-CM | POA: Diagnosis not present

## 2016-03-11 DIAGNOSIS — F17200 Nicotine dependence, unspecified, uncomplicated: Secondary | ICD-10-CM | POA: Insufficient documentation

## 2016-03-11 DIAGNOSIS — T8131XA Disruption of external operation (surgical) wound, not elsewhere classified, initial encounter: Secondary | ICD-10-CM | POA: Diagnosis not present

## 2016-03-12 ENCOUNTER — Ambulatory Visit (INDEPENDENT_AMBULATORY_CARE_PROVIDER_SITE_OTHER): Payer: Medicare Other | Admitting: Vascular Surgery

## 2016-03-12 ENCOUNTER — Encounter: Payer: Self-pay | Admitting: Vascular Surgery

## 2016-03-12 VITALS — BP 148/88 | HR 102 | Temp 98.6°F | Resp 22 | Ht 67.0 in | Wt 188.0 lb

## 2016-03-12 DIAGNOSIS — I739 Peripheral vascular disease, unspecified: Secondary | ICD-10-CM

## 2016-03-12 NOTE — Progress Notes (Signed)
Subjective:    Patient ID: Clinton HammockReginald E Harrington, male DOB: 09/19/1951, 65 y.o.   MRN: [id number]   HPI:  this 65 year old male returns for continued follow-up regarding his left femoral popliteal saphenous vein graft which I inserted 01/01/2016 for nonhealing ulcers left foot. He is attending the wound center once a week. The ulceration on the dorsum of the left foot and the heel seem to be improving. He does have an open wound where the popliteal exposure was performed which measures about 7 x 3 cm. There is consideration for skin graft placement on this area to facilitate healing. Patient feels that the wounds are all definitely improving.   ROS:    Objective:  Physical Exam: BP 148/88 mmHg  Pulse 102  Temp(Src) 98.6 F (37 C) (Oral)  Resp 22  Ht 5\' 7"  (1.702 m)  Wt 188 lb (85.276 kg)  BMI 29.44 kg/m2  SpO2 96%    Gen. Alert and oriented 3 no apparent distress  left lower extremity with 3+ popliteal graft pulse palpable   dorsum of foot has granulating ulceration about 2 cm in diameter with no exposed tendon  Dry gangrene tip of left fifth toe which is stable He'll ulcer measuring about 3 x 1 cm which is not infected  Popliteal wound measures about 6 x 2.5 cm and is granulating nicely with no evidence of infection    Assessment:      slowly healing wounds left leg following revascularization with saphenous vein graft 01/01/2016    attending wound center weekly    Plan:      continue  Wound Ctr.. Treatment He'll return in 2 months with duplex scan of left femoral popliteal saphenous vein graft and repeat ABIs

## 2016-03-18 DIAGNOSIS — E11621 Type 2 diabetes mellitus with foot ulcer: Secondary | ICD-10-CM | POA: Diagnosis not present

## 2016-03-25 DIAGNOSIS — E11621 Type 2 diabetes mellitus with foot ulcer: Secondary | ICD-10-CM | POA: Diagnosis not present

## 2016-03-28 ENCOUNTER — Encounter: Payer: Self-pay | Admitting: Neurology

## 2016-03-28 ENCOUNTER — Ambulatory Visit (INDEPENDENT_AMBULATORY_CARE_PROVIDER_SITE_OTHER): Payer: Medicare Other | Admitting: Neurology

## 2016-03-28 VITALS — BP 150/84 | HR 88 | Ht 67.0 in | Wt 194.2 lb

## 2016-03-28 DIAGNOSIS — E0842 Diabetes mellitus due to underlying condition with diabetic polyneuropathy: Secondary | ICD-10-CM

## 2016-03-28 DIAGNOSIS — M792 Neuralgia and neuritis, unspecified: Secondary | ICD-10-CM

## 2016-03-28 MED ORDER — NORTRIPTYLINE HCL 10 MG PO CAPS: ORAL_CAPSULE | ORAL | Status: DC

## 2016-03-28 NOTE — Progress Notes (Signed)
Note routed

## 2016-03-28 NOTE — Patient Instructions (Addendum)
Take nortriptyline 10mg  at bedtime for 2 week, then increase to 2 tablet at bedtime. Try to limit tramadol while taking this medication Call me with an update in 1 month  Return to clinic 3 months

## 2016-03-28 NOTE — Progress Notes (Signed)
[facility] Clinic Note - Initial Visit   Date: 03/28/2016  [name] MRN: [id number] DOB: [birth year]   Dear Dr. Azucena Cecil:  Thank you for your kind referral of Clinton Harrington for consultation of neuropathic pain. Although his history is well known to you, please allow [name] to reiterate it for the purpose of our medical record. The patient was accompanied to the clinic by self.    History of Present Illness: Clinton Harrington is a 65 y.o. right-handed Caucasian male with hyperlipidemia, diabetes mellitus, hypertension, peripheral vascular disease s/p left common femoral to below-knee popliteal artery bypass,  tobacco use and CAD with MI (1999) presenting for evaluation of worsening neuropathic pain.    He was diagnosed with diabetes in 1999.  Several years later, he began having stinging and burning pain of the feet and later even in his hands.   Symptoms are distal and involve up to his ankles and wrists. Pain is constant, no identifiable triggers or exacerbating factors.  NCS/EMG performed by myself in January 2017 showed active on chronic axonal sensorimotor polyneuropathy affecting the lower extremities, severe in degree electrically. He currently takes gabapentin  BID and unable to tolerate higher dosage due to GI side effects.  He also takes tramadol  twice daily as needed which helps more than gabapentin.  He denies any weakness, imbalance, or fall.    He underwent left common femoral-popliteal bypass in January because of left foot ulcer.  He has been using a walker since his surgery to avoid weight bearing.   Out-side paper records, electronic medical record, and images have been reviewed where available and summarized as:  NCS/EMG of lower extremities 12/14/2015:  The electrophysiologic findings are most consistent with active on chronic sensorimotor polyneuropathy, predominantly axon loss in type, affecting the lower extremities. Overall,  these findings are severe in degree electrically.   Lab Results  Component Value Date   HGBA1C 7.6* 01/01/2016    Past Medical History  Diagnosis Date  . Myocardial infarct (HCC)   . High cholesterol   . Arthritis   . Seasonal allergies   . Hypertension   . Urine incontinence   . Diabetes mellitus Jan. 1999  . Diabetes (HCC)   . Peripheral vascular disease [facility])     Past Surgical History  Procedure Laterality Date  . Coronary stent placement  Jan.  1999  . Peripheral vascular catheterization N/A 01/01/2016    Procedure: Abdominal Aortogram w/Lower Extremity;  Surgeon: Fransisco Hertz, MD;  Location: [facility] INVASIVE CV LAB;  Service: Cardiovascular;  Laterality: N/A;  . Peripheral vascular catheterization Left 01/01/2016    Procedure: Peripheral Vascular Balloon Angioplasty;  Surgeon: Fransisco Hertz, MD;  Location: [facility] INVASIVE CV LAB;  Service: Cardiovascular;  Laterality: Left;  Popliteal artery  . Femoral-popliteal bypass graft Left 01/03/2016    Procedure: LEFT COMMON FEMORAL-BELOW KNEE POPLITEAL ARTERY BYPASS GRAFT USING NON REVERSED TRANSLOCATED SAPHENOUS VEIN GRAFT;  Surgeon: Pryor Ochoa, MD;  Location: [facility] OR;  Service: Vascular;  Laterality: Left;  . Intraoperative arteriogram Left 01/03/2016    Procedure: INTRA OPERATIVE ARTERIOGRAM LEFT LEG;  Surgeon: Pryor Ochoa, MD;  Location: [facility] OR;  Service: Vascular;  Laterality: Left;     Medications:  Outpatient Encounter Prescriptions as of 03/28/2016  Medication Sig Note  . acetaminophen (TYLENOL) 650 MG CR tablet Take 1,300 mg by mouth 2 (two) times daily.   . carvedilol (COREG) 3.125 MG tablet Take 1 tablet (3.125 mg total) by mouth  2 (two) times daily with a meal.   . clopidogrel (PLAVIX) 75 MG tablet Take 1 tablet (75 mg total) by mouth daily.   . collagenase (SANTYL) ointment Apply 1 application topically daily.   Marland Kitchen gabapentin (NEURONTIN) 300 MG capsule Take 900 mg by mouth 3 (three) times daily.    Marland Kitchen glimepiride (AMARYL) 4 MG  tablet Take 4 mg by mouth 2 (two) times daily.   Marland Kitchen glucose blood (FREESTYLE LITE) test strip Use as instructed   . glucose blood test strip Use as instructed   . Lancets (FREESTYLE) lancets Use as instructed   . lisinopril (PRINIVIL,ZESTRIL) 40 MG tablet Take 1 tablet (40 mg total) by mouth daily.   . metFORMIN (GLUCOPHAGE) 1000 MG tablet Take 1 tablet (1,000 mg total) by mouth 2 (two) times daily with a meal.   . pravastatin (PRAVACHOL) 40 MG tablet Take 1 tablet (40 mg total) by mouth daily.   Marland Kitchen PRESCRIPTION MEDICATION Inhale into the lungs at bedtime. CPAP   . traMADol (ULTRAM) 50 MG tablet Take 1-2 tablets by mouth every 6 hours as needed for pain.   . traZODone (DESYREL) 100 MG tablet  03/28/2016: Received from: External Pharmacy  . [DISCONTINUED] cyclobenzaprine (FLEXERIL) 10 MG tablet Take 10 mg by mouth at bedtime.   . [DISCONTINUED] traZODone (DESYREL) 100 MG tablet  03/28/2016: Received from: [facility]  . [DISCONTINUED] varenicline (CHANTIX STARTING MONTH PAK) 0.5 MG X 11 & 1 MG X 42 tablet  03/28/2016: Received from: [facility]  . nortriptyline (PAMELOR) 10 MG capsule Take nortriptyline 10mg  at bedtime for 2 week, then increase to 2 tablet at bedtime   . [DISCONTINUED] levofloxacin (LEVAQUIN) 500 MG tablet Take 1 tablet (500 mg total) by mouth daily.    No facility-administered encounter medications on file as of 03/28/2016.    Allergies:  Allergies  Allergen Reactions  . Aspirin Other (See Comments)    Stomach upset  . Oxycodone-Acetaminophen Itching  . Latex Rash    Family History: Family History  Problem Relation Age of Onset  . Arthritis Mother   . Heart disease Maternal Grandmother   . Hypertension Mother   . Mental illness Maternal Grandmother     Social History: Social History  Substance Use Topics  . Smoking status: Current Every Day Smoker -- 1.00 packs/day for 45 years    Types: Cigarettes  . Smokeless  tobacco: Never Used     Comment: Reduced to 0.5ppd  . Alcohol Use: No   Social History   Social History Narrative   Disability since 2015.  Previously worked at [employer] for 10 years.    He lives at home with mother with dementia.   He has one daughter.     Review of Systems:  CONSTITUTIONAL: No fevers, chills, night sweats, or weight loss.   EYES: No visual changes or eye pain ENT: No hearing changes.  No history of nose bleeds.   RESPIRATORY: No cough, wheezing and shortness of breath.   CARDIOVASCULAR: Negative for chest pain, and palpitations.   GI: Negative for abdominal discomfort, blood in stools or black stools.  No recent change in bowel habits.   GU:  No history of incontinence.   MUSCLOSKELETAL: No history of joint pain or swelling.  No myalgias.   SKIN: Negative for lesions, rash, and itching.   HEMATOLOGY/ONCOLOGY: Negative for prolonged bleeding, bruising easily, and swollen nodes.  No history of cancer.   ENDOCRINE: Negative for cold  or heat intolerance, polydipsia or goiter.   PSYCH:  No depression or anxiety symptoms.   NEURO: As Above.   Vital Signs:  BP 150/84 mmHg  Pulse 88  Ht 5\' 7"  (1.702 m)  Wt 194 lb 3 oz (88.083 kg)  BMI 30.41 kg/m2   General Medical Exam:   General:  Well appearing, comfortable.   Eyes/ENT: see cranial nerve examination.   Neck: No masses appreciated.  Full range of motion without tenderness.  No carotid bruits. Respiratory:  Clear to auscultation, good air entry bilaterally.   Cardiac:  Regular rate and rhythm, no murmur.   Extremities:  Left lower leg is wrapped.  Left 5th digit is gangrenous   Skin:  No rashes or lesions.  Neurological Exam: MENTAL STATUS including orientation to time, place, person, recent and remote memory, attention span and concentration, language, and fund of knowledge is normal.  Speech is not dysarthric.  CRANIAL NERVES: II:  No visual field defects.  Unremarkable fundi.   III-IV-VI: Pupils  equal round and reactive to light.  Normal conjugate, extra-ocular eye movements in all directions of gaze.  No nystagmus.  No ptosis.   V:  Normal facial sensation.   VII:  Normal facial symmetry and movements.  No pathologic facial reflexes.  VIII:  Normal hearing and vestibular function.   IX-X:  Normal palatal movement.   XI:  Normal shoulder shrug and head rotation.   XII:  Normal tongue strength and range of motion, no deviation or fasciculation.  MOTOR:  No atrophy, fasciculations or abnormal movements.  No pronator drift.  Tone is normal.    Right Upper Extremity:    Left Upper Extremity:    Deltoid  5/5   Deltoid  5/5   Biceps  5/5   Biceps  5/5   Triceps  5/5   Triceps  5/5   Wrist extensors  5/5   Wrist extensors  5/5   Wrist flexors  5/5   Wrist flexors  5/5   Finger extensors  5/5   Finger extensors  5/5   Finger flexors  5/5   Finger flexors  5/5   Dorsal interossei  5/5   Dorsal interossei  5/5   Abductor pollicis  5/5   Abductor pollicis  5/5   Tone (Ashworth scale)  0  Tone (Ashworth scale)  0   Right Lower Extremity:    Left Lower Extremity:    Hip flexors  5/5   Hip flexors  5/5   Hip extensors  5/5   Hip extensors  5/5   Knee flexors  5/5   Knee flexors  5/5   Knee extensors  5/5   Knee extensors  5/5   Dorsiflexors  5/5   Dorsiflexors  5/5   Plantarflexors  5/5   Plantarflexors  5/5   Toe extensors  5/5   Toe extensors  5-/5   Toe flexors  5-/5   Toe flexors  4/5   Tone (Ashworth scale)  0  Tone (Ashworth scale)  0   MSRs:  Right                                                                 Left brachioradialis 2+  brachioradialis 2+  biceps 2+  biceps 2+  triceps 2+  triceps 2+  patellar 0  Patellar 0  ankle jerk 0  ankle jerk 0  Hoffman no  Hoffman no  plantar response mute  plantar response mute   SENSORY:  Vibration reduced distal to knees bilaterally (50%) and trace at the great toe, worse on the left.  Temperature and pin prick also reduced ina  gradient manner below the knee.   COORDINATION/GAIT: Normal finger-to- nose-finger  Intact rapid alternating movements bilaterally. He has antalgic gait due to recent left fem-pop bypass.  Left lower extremities is wrapped in gauze with drainage noted proximally.   IMPRESSION: 1. Painful distal and symmetric diabetic polyneuropathy  - NCS/EMG showed active on chronic axonal sensorimotor polyneuropathy affecting the lower extremities, severe in degree electrically  - Exam shows predominately sensory impairment with ataxia  - Discussed goals of management which include pain control and slowing progression of neuropathy  - Encouraged tight glycemic control and tobacco cessation  - Start nortriptyline  at bedtime x 2 weeks, then increase to 2 tablet at bedtime.  Side effects discussed, including potential drug-drug interaction with tramadol.  He has been instructed to try to reduce his tramadol use.   Consider Lyrica or Cymbalta going forward  - Continue gabapentin  BID  - Fall precautions discussed  - Call with update in 1 month  2.  Peripheral vascular disease s/p left fem-pop bypass, followed by vascular surgery  Return to clinic in 3 months.   The duration of this appointment visit was 35 minutes of face-to-face time with the patient.  Greater than 50% of this time was spent in counseling, explanation of diagnosis, planning of further management, and coordination of care.   Thank you for allowing me to participate in patient's care.  If I can answer any additional questions, I would be pleased to do so.    Sincerely,    Donika K. Allena Katz, DO

## 2016-04-01 ENCOUNTER — Ambulatory Visit (INDEPENDENT_AMBULATORY_CARE_PROVIDER_SITE_OTHER): Payer: Medicare Other | Admitting: Ophthalmology

## 2016-04-01 DIAGNOSIS — E11319 Type 2 diabetes mellitus with unspecified diabetic retinopathy without macular edema: Secondary | ICD-10-CM

## 2016-04-01 DIAGNOSIS — E113213 Type 2 diabetes mellitus with mild nonproliferative diabetic retinopathy with macular edema, bilateral: Secondary | ICD-10-CM | POA: Diagnosis not present

## 2016-04-01 DIAGNOSIS — H43813 Vitreous degeneration, bilateral: Secondary | ICD-10-CM

## 2016-04-01 DIAGNOSIS — H35033 Hypertensive retinopathy, bilateral: Secondary | ICD-10-CM

## 2016-04-01 DIAGNOSIS — H353121 Nonexudative age-related macular degeneration, left eye, early dry stage: Secondary | ICD-10-CM | POA: Diagnosis not present

## 2016-04-01 DIAGNOSIS — E11621 Type 2 diabetes mellitus with foot ulcer: Secondary | ICD-10-CM | POA: Diagnosis not present

## 2016-04-01 DIAGNOSIS — I10 Essential (primary) hypertension: Secondary | ICD-10-CM

## 2016-04-08 ENCOUNTER — Encounter (HOSPITAL_BASED_OUTPATIENT_CLINIC_OR_DEPARTMENT_OTHER): Payer: Medicare Other | Attending: Internal Medicine

## 2016-04-08 DIAGNOSIS — L97511 Non-pressure chronic ulcer of other part of right foot limited to breakdown of skin: Secondary | ICD-10-CM | POA: Insufficient documentation

## 2016-04-08 DIAGNOSIS — Z955 Presence of coronary angioplasty implant and graft: Secondary | ICD-10-CM | POA: Insufficient documentation

## 2016-04-08 DIAGNOSIS — E1140 Type 2 diabetes mellitus with diabetic neuropathy, unspecified: Secondary | ICD-10-CM | POA: Diagnosis not present

## 2016-04-08 DIAGNOSIS — E11621 Type 2 diabetes mellitus with foot ulcer: Secondary | ICD-10-CM | POA: Diagnosis present

## 2016-04-08 DIAGNOSIS — T8130XA Disruption of wound, unspecified, initial encounter: Secondary | ICD-10-CM | POA: Diagnosis not present

## 2016-04-08 DIAGNOSIS — I252 Old myocardial infarction: Secondary | ICD-10-CM | POA: Diagnosis not present

## 2016-04-08 DIAGNOSIS — I10 Essential (primary) hypertension: Secondary | ICD-10-CM | POA: Diagnosis not present

## 2016-04-08 DIAGNOSIS — I2510 Atherosclerotic heart disease of native coronary artery without angina pectoris: Secondary | ICD-10-CM | POA: Insufficient documentation

## 2016-04-08 DIAGNOSIS — G4730 Sleep apnea, unspecified: Secondary | ICD-10-CM | POA: Diagnosis not present

## 2016-04-08 DIAGNOSIS — Y838 Other surgical procedures as the cause of abnormal reaction of the patient, or of later complication, without mention of misadventure at the time of the procedure: Secondary | ICD-10-CM | POA: Diagnosis not present

## 2016-04-15 DIAGNOSIS — E11621 Type 2 diabetes mellitus with foot ulcer: Secondary | ICD-10-CM | POA: Diagnosis not present

## 2016-04-22 ENCOUNTER — Other Ambulatory Visit (INDEPENDENT_AMBULATORY_CARE_PROVIDER_SITE_OTHER): Payer: Medicare Other | Admitting: Ophthalmology

## 2016-04-22 DIAGNOSIS — E11311 Type 2 diabetes mellitus with unspecified diabetic retinopathy with macular edema: Secondary | ICD-10-CM | POA: Diagnosis not present

## 2016-04-22 DIAGNOSIS — E113211 Type 2 diabetes mellitus with mild nonproliferative diabetic retinopathy with macular edema, right eye: Secondary | ICD-10-CM | POA: Diagnosis not present

## 2016-04-22 DIAGNOSIS — E11621 Type 2 diabetes mellitus with foot ulcer: Secondary | ICD-10-CM | POA: Diagnosis not present

## 2016-04-29 ENCOUNTER — Telehealth: Payer: Self-pay | Admitting: Neurology

## 2016-04-29 DIAGNOSIS — E11621 Type 2 diabetes mellitus with foot ulcer: Secondary | ICD-10-CM | POA: Diagnosis not present

## 2016-04-29 NOTE — Telephone Encounter (Signed)
Called the pharmacy back and verified that Dr. Allena KatzPatel did order the nortriptyline.

## 2016-04-29 NOTE — Telephone Encounter (Signed)
VM-Stephanie calling from a pharmacy in regards to PT's prescription/Dawn CB# [phone]/ UJW#[id number]

## 2016-05-07 DIAGNOSIS — E11621 Type 2 diabetes mellitus with foot ulcer: Secondary | ICD-10-CM | POA: Diagnosis not present

## 2016-05-13 ENCOUNTER — Encounter (HOSPITAL_BASED_OUTPATIENT_CLINIC_OR_DEPARTMENT_OTHER): Payer: Medicare Other | Attending: Internal Medicine

## 2016-05-13 DIAGNOSIS — F17200 Nicotine dependence, unspecified, uncomplicated: Secondary | ICD-10-CM | POA: Insufficient documentation

## 2016-05-13 DIAGNOSIS — I252 Old myocardial infarction: Secondary | ICD-10-CM | POA: Insufficient documentation

## 2016-05-13 DIAGNOSIS — E11621 Type 2 diabetes mellitus with foot ulcer: Secondary | ICD-10-CM | POA: Diagnosis not present

## 2016-05-13 DIAGNOSIS — G4730 Sleep apnea, unspecified: Secondary | ICD-10-CM | POA: Diagnosis not present

## 2016-05-13 DIAGNOSIS — L97522 Non-pressure chronic ulcer of other part of left foot with fat layer exposed: Secondary | ICD-10-CM | POA: Insufficient documentation

## 2016-05-13 DIAGNOSIS — E1140 Type 2 diabetes mellitus with diabetic neuropathy, unspecified: Secondary | ICD-10-CM | POA: Insufficient documentation

## 2016-05-13 DIAGNOSIS — I10 Essential (primary) hypertension: Secondary | ICD-10-CM | POA: Diagnosis not present

## 2016-05-13 DIAGNOSIS — E1152 Type 2 diabetes mellitus with diabetic peripheral angiopathy with gangrene: Secondary | ICD-10-CM | POA: Insufficient documentation

## 2016-05-15 ENCOUNTER — Other Ambulatory Visit: Payer: Self-pay | Admitting: *Deleted

## 2016-05-15 DIAGNOSIS — I739 Peripheral vascular disease, unspecified: Secondary | ICD-10-CM

## 2016-05-15 DIAGNOSIS — Z48812 Encounter for surgical aftercare following surgery on the circulatory system: Secondary | ICD-10-CM

## 2016-05-16 ENCOUNTER — Encounter: Payer: Self-pay | Admitting: Family

## 2016-05-20 DIAGNOSIS — E11621 Type 2 diabetes mellitus with foot ulcer: Secondary | ICD-10-CM | POA: Diagnosis not present

## 2016-05-23 ENCOUNTER — Ambulatory Visit (INDEPENDENT_AMBULATORY_CARE_PROVIDER_SITE_OTHER)
Admission: RE | Admit: 2016-05-23 | Discharge: 2016-05-23 | Disposition: A | Discharge status: Home / Self Care | Payer: Medicare Other | Source: Ambulatory Visit | Attending: Family | Admitting: Family

## 2016-05-23 ENCOUNTER — Ambulatory Visit (HOSPITAL_COMMUNITY)
Admission: RE | Admit: 2016-05-23 | Discharge: 2016-05-23 | Disposition: A | Discharge status: Home / Self Care | Payer: Medicare Other | Source: Ambulatory Visit | Attending: Family | Admitting: Family

## 2016-05-23 ENCOUNTER — Encounter: Payer: Self-pay | Admitting: Family

## 2016-05-23 ENCOUNTER — Ambulatory Visit (INDEPENDENT_AMBULATORY_CARE_PROVIDER_SITE_OTHER): Payer: Medicare Other | Admitting: Family

## 2016-05-23 VITALS — BP 142/91 | HR 79 | Ht 67.0 in | Wt 197.7 lb

## 2016-05-23 DIAGNOSIS — Z48812 Encounter for surgical aftercare following surgery on the circulatory system: Secondary | ICD-10-CM

## 2016-05-23 DIAGNOSIS — R0989 Other specified symptoms and signs involving the circulatory and respiratory systems: Secondary | ICD-10-CM | POA: Diagnosis present

## 2016-05-23 DIAGNOSIS — Z95828 Presence of other vascular implants and grafts: Secondary | ICD-10-CM | POA: Diagnosis not present

## 2016-05-23 DIAGNOSIS — Z720 Tobacco use: Secondary | ICD-10-CM | POA: Diagnosis not present

## 2016-05-23 DIAGNOSIS — I70244 Atherosclerosis of native arteries of left leg with ulceration of heel and midfoot: Secondary | ICD-10-CM

## 2016-05-23 DIAGNOSIS — I739 Peripheral vascular disease, unspecified: Secondary | ICD-10-CM

## 2016-05-23 DIAGNOSIS — E7800 Pure hypercholesterolemia, unspecified: Secondary | ICD-10-CM | POA: Insufficient documentation

## 2016-05-23 DIAGNOSIS — E119 Type 2 diabetes mellitus without complications: Secondary | ICD-10-CM | POA: Insufficient documentation

## 2016-05-23 DIAGNOSIS — I10 Essential (primary) hypertension: Secondary | ICD-10-CM | POA: Insufficient documentation

## 2016-05-23 DIAGNOSIS — F17200 Nicotine dependence, unspecified, uncomplicated: Secondary | ICD-10-CM

## 2016-05-23 DIAGNOSIS — E1151 Type 2 diabetes mellitus with diabetic peripheral angiopathy without gangrene: Secondary | ICD-10-CM | POA: Diagnosis not present

## 2016-05-23 NOTE — Progress Notes (Signed)
[facility] [place]   CC: Follow up peripheral artery occlusive disease  History of Present Illness Clinton Harrington is a 65 y.o. male patient of Dr. Hart RochesterLawson who is s/p left femoral popliteal bpg using saphenous vein graft on 01/01/2016 for nonhealing ulcers left foot.  He returns today for routine follow up. Pt denies any claudication sx's with walking since the January 2017 left leg bpg. The wounds on his left foot have healed and he was released from the wound care center on 05/20/16. He walks all day. He denies any hx of stroke or TIA. He reports an MI in 1999, no subsequent MI.  0.8 creatinine in January 2017 (review of records).  Pt Diabetic: Yes, states his last A1C was 7.3 or 7.7 Pt smoker: smoker  (1/2 ppd, started at age 89 yrs)  Pt meds include: Statin :Yes Betablocker: Yes ASA: No, is allergic Other anticoagulants/antiplatelets: Plavix  Past Medical History  Diagnosis Date  . Myocardial infarct (HCC)   . High cholesterol   . Arthritis   . Seasonal allergies   . Hypertension   . Urine incontinence   . Diabetes mellitus Jan. 1999  . Diabetes (HCC)   . Peripheral vascular disease [facility](HCC)     Social History Social History  Substance Use Topics  . Smoking status: Current Every Day Smoker -- 1.00 packs/day for 45 years    Types: Cigarettes  . Smokeless tobacco: Never Used     Comment: Reduced to 0.5ppd  . Alcohol Use: No    Family History Family History  Problem Relation Age of Onset  . Arthritis Mother   . Heart disease Maternal Grandmother   . Hypertension Mother   . Mental illness Maternal Grandmother     Past Surgical History  Procedure Laterality Date  . Coronary stent placement  Jan.  1999  . Peripheral vascular catheterization N/A 01/01/2016    Procedure: Abdominal Aortogram w/Lower Extremity;  Surgeon: Fransisco HertzBrian L Chen, MD;  [facility];  Service: Cardiovascular;  Laterality: N/A;  . Peripheral vascular  catheterization Left 01/01/2016    Procedure: Peripheral Vascular Balloon Angioplasty;  Surgeon: Fransisco HertzBrian L Chen, MD;  [facility];  Service: Cardiovascular;  Laterality: Left;  Popliteal artery  . Femoral-popliteal bypass graft Left 01/03/2016    Procedure: LEFT COMMON FEMORAL-BELOW KNEE POPLITEAL ARTERY BYPASS GRAFT USING NON REVERSED TRANSLOCATED SAPHENOUS VEIN GRAFT;  Surgeon: Pryor OchoaJames D Lawson, MD;  [facility];  Service: Vascular;  Laterality: Left;  . Intraoperative arteriogram Left 01/03/2016    Procedure: INTRA OPERATIVE ARTERIOGRAM LEFT LEG;  Surgeon: Pryor OchoaJames D Lawson, MD;  [facility];  Service: Vascular;  Laterality: Left;    Allergies  Allergen Reactions  . Aspirin Other (See Comments)    Stomach upset  . Oxycodone-Acetaminophen Itching  . Latex Rash    Current Outpatient Prescriptions  Medication Sig Dispense Refill  . acetaminophen (TYLENOL) 650 MG CR tablet Take 1,300 mg by mouth 2 (two) times daily.    . carvedilol (COREG) 3.125 MG tablet Take 1 tablet (3.125 mg total) by mouth 2 (two) times daily with a meal. 60 tablet 3  . clopidogrel (PLAVIX) 75 MG tablet Take 1 tablet (75 mg total) by mouth daily. 30 tablet 6  . collagenase (SANTYL) ointment Apply 1 application topically daily. 15 g 0  . gabapentin (NEURONTIN) 300 MG capsule Take 900 mg by mouth 3 (three) times daily.   0  . glimepiride (AMARYL) 4 MG tablet  Take 4 mg by mouth 2 (two) times daily.    Marland Kitchen glucose blood (FREESTYLE LITE) test strip Use as instructed 100 each 12  . glucose blood test strip Use as instructed 100 each 12  . Lancets (FREESTYLE) lancets Use as instructed 100 each 12  . lisinopril (PRINIVIL,ZESTRIL) 40 MG tablet Take 1 tablet (40 mg total) by mouth daily. 90 tablet 3  . metFORMIN (GLUCOPHAGE) 1000 MG tablet Take 1 tablet (1,000 mg total) by mouth 2 (two) times daily with a meal. 180 tablet 3  . nortriptyline (PAMELOR) 10 MG capsule Take nortriptyline 10mg  at bedtime for 2 week,  then increase to 2 tablet at bedtime 60 capsule 5  . pravastatin (PRAVACHOL) 40 MG tablet Take 1 tablet (40 mg total) by mouth daily. 90 tablet 3  . PRESCRIPTION MEDICATION Inhale into the lungs at bedtime. CPAP    . traMADol (ULTRAM) 50 MG tablet Take 1-2 tablets by mouth every 6 hours as needed for pain. 30 tablet 0  . traZODone (DESYREL) 100 MG tablet   5   No current facility-administered medications for this visit.    ROS: See HPI for pertinent positives and negatives.   Physical Examination  Filed Vitals:   05/23/16 1141 05/23/16 1143  BP: 145/93 142/91  Pulse: 79   Height: 5\' 7"  (1.702 m)   Weight: 197 lb 11.2 oz (89.676 kg)   SpO2: 95%    Body mass index is 30.96 kg/(m^2).  General: A&O x 3, WDWN, obese male. Gait: normal Eyes: PERRLA. Pulmonary: CTAB, without wheezes , rales or rhonchi. Cardiac: regular rythm , no detected murmur.         Carotid Bruits Right Left   Negative Negative  Aorta is not palpable. Radial pulses: are 2+ palpable                           VASCULAR EXAM: Extremities no ischemic changes, no Gangrene; no open wounds. Left lower leg with 1+ pitting and non pitting edema. The wounds on his left foot have healed.                                                                                                          LE Pulses Right Left       FEMORAL  2+ palpable  2+ palpable        POPLITEAL  not palpable   not palpable       POSTERIOR TIBIAL  not palpable   not palpable        DORSALIS PEDIS      ANTERIOR TIBIAL not palpable  not palpable    Abdomen: soft, NT, no palpable masses. Skin: no rashes, no ulcers. Musculoskeletal: no muscle wasting or atrophy.  Neurologic: A&O X 3; Appropriate Affect ; SENSATION: normal; MOTOR FUNCTION:  moving all extremities equally, motor strength 5/5 throughout. Speech is fluent/normal. CN 2-12 intact.    Non-Invasive Vascular Imaging: DATE: 05/23/2016  Left Lower Extremity Arterial  Duplex: Limited visualization of the distal bypass  and anastomosis due to depth and edema. Pulsatile, turbulent left popliteal vein flow which appears c/w a possible arteriovenous communication. Left LE bypass graft with no restenosis based on limited visualization.  ABI: Right: 0.86 (1.01, 12/29/15); waveforms: mono PT, triphasic DP, TBI: 0.58 Left: 0.86 (0.56, pre-bypass), waveforms: mono PT, biphasic DP, TBI: 0.63   ASSESSMENT: SELESTINO NILA is a 65 y.o. male who is s/p left femoral to below-knee popliteal bypass with saphenous vein on 01/03/2016.  Pt denies any claudication sx's with walking since the January 2017 left leg bpg. The wounds on his left foot have healed and he was released from the wound care center on 05/20/16. He walks all day.  He has no signs of ischemia in his feet/legs.  Left LE arterial duplex today suggests a pulsatile, turbulent left popliteal vein flow which appears c/w a possible arteriovenous communication. Left LE bypass graft with no restenosis based on limited visualization.  ABI has worsened in the right leg from normal to mild arterial occlusive disease and improved in the left leg since the bypass graft: from severe to mild arterial occlusive disease.  His atherosclerotic risk factors include continued smoking and almost in control DM.   I discussed with Dr. Imogene Burn the pulsatile, turbulent left popliteal vein flow which appears c/w a possible arteriovenous communication. This is not a worrisome finding, will monitor in subsequent arterial duplex of left LE.   PLAN:  Based on the patient's vascular studies and examination, pt will return to clinic in 3 months with ABI's and left LE arterial duplex.  The patient was counseled re smoking cessation and given several free resources re smoking cessation. Graduated walking program discussed and how to achieve.  Elevate legs above heart when not walking to minimize postoperative swelling in the left  LE.  I discussed in depth with the patient the nature of atherosclerosis, and emphasized the importance of maximal medical management including strict control of blood pressure, blood glucose, and lipid levels, obtaining regular exercise, and cessation of smoking.  The patient is aware that without maximal medical management the underlying atherosclerotic disease process will progress, limiting the benefit of any interventions.  The patient was given information about PAD including signs, symptoms, treatment, what symptoms should prompt the patient to seek immediate medical care, and risk reduction measures to take.  Charisse March, RN, MSN, FNP-C [facility] Phone: [phone]  Clinic MD: Imogene Burn  05/23/2016 12:14 PM

## 2016-05-27 NOTE — Addendum Note (Signed)
Addended by: Sharee PimpleMCCHESNEY, MARILYN K on: 05/27/2016 04:44 PM   Modules accepted: Orders

## 2016-07-01 ENCOUNTER — Ambulatory Visit (INDEPENDENT_AMBULATORY_CARE_PROVIDER_SITE_OTHER): Payer: Medicare Other | Admitting: Neurology

## 2016-07-01 ENCOUNTER — Encounter: Payer: Self-pay | Admitting: Neurology

## 2016-07-01 VITALS — BP 110/80 | HR 82 | Ht 67.0 in | Wt 202.1 lb

## 2016-07-01 DIAGNOSIS — E0842 Diabetes mellitus due to underlying condition with diabetic polyneuropathy: Secondary | ICD-10-CM

## 2016-07-01 NOTE — Progress Notes (Signed)
Follow-up Visit   Date: 07/01/16    Clinton Harrington MRN: [id number] DOB: 11/01/1951   Interim History: Clinton Harrington is a 65 y.o. right-handed Caucasian male with hyperlipidemia, diabetes mellitus, hypertension, peripheral vascular disease s/p left common femoral to below-knee popliteal artery bypass,  tobacco use and CAD with MI (1999) returning to the clinic for follow-up of diabetic polyneuropathy.  The patient was accompanied to the clinic by self.  History of present illness: He was diagnosed with diabetes in 1999.  Several years later, he began having stinging and burning pain of the feet and later even in his hands.   Symptoms are distal and involve up to his ankles and wrists. Pain is constant, no identifiable triggers or exacerbating factors.  NCS/EMG performed by myself in January 2017 showed active on chronic axonal sensorimotor polyneuropathy affecting the lower extremities, severe in degree electrically. He currently takes gabapentin 900mg  BID and unable to tolerate higher dosage due to GI side effects.  He also takes tramadol 50mg  twice daily as needed which helps more than gabapentin.  He denies any weakness, imbalance, or fall.    He underwent left common femoral-popliteal bypass in January because of left foot ulcer.  He has been using a walker since his surgery to avoid weight bearing.   UPDATE 07/01/2016:  He has been gabapentin 900mg  twice daily and tylenol arthritis which helps is pain and paresthesias by 50%.  He stopped nortriptyline because of no benefit.  His left foot ulcer has healed and he was discharged from the wound center.  He continues to walk independently and has noticed that since his wound had healed, balance is much better.  No new complaints.   Medications:  Current Outpatient Prescriptions on File Prior to Visit  Medication Sig Dispense Refill  . acetaminophen (TYLENOL) 650 MG CR tablet Take 1,300 mg by mouth 2 (two) times daily.    .  carvedilol (COREG) 3.125 MG tablet Take 1 tablet (3.125 mg total) by mouth 2 (two) times daily with a meal. 60 tablet 3  . clopidogrel (PLAVIX) 75 MG tablet Take 1 tablet (75 mg total) by mouth daily. 30 tablet 6  . collagenase (SANTYL) ointment Apply 1 application topically daily. 15 g 0  . gabapentin (NEURONTIN) 300 MG capsule Take 900 mg by mouth 2 (two) times daily.   0  . glimepiride (AMARYL) 4 MG tablet Take 4 mg by mouth 2 (two) times daily.    Marland Kitchen glucose blood (FREESTYLE LITE) test strip Use as instructed 100 each 12  . glucose blood test strip Use as instructed 100 each 12  . Lancets (FREESTYLE) lancets Use as instructed 100 each 12  . lisinopril (PRINIVIL,ZESTRIL) 40 MG tablet Take 1 tablet (40 mg total) by mouth daily. 90 tablet 3  . metFORMIN (GLUCOPHAGE) 1000 MG tablet Take 1 tablet (1,000 mg total) by mouth 2 (two) times daily with a meal. 180 tablet 3  . pravastatin (PRAVACHOL) 40 MG tablet Take 1 tablet (40 mg total) by mouth daily. 90 tablet 3  . PRESCRIPTION MEDICATION Inhale into the lungs at bedtime. CPAP    . traMADol (ULTRAM) 50 MG tablet Take 1-2 tablets by mouth every 6 hours as needed for pain. 30 tablet 0  . traZODone (DESYREL) 100 MG tablet   5  . nortriptyline (PAMELOR) 10 MG capsule Take nortriptyline 10mg  at bedtime for 2 week, then increase to 2 tablet at bedtime (Patient not taking: Reported on 07/01/2016) 60 capsule 5  No current facility-administered medications on file prior to visit.     Allergies:  Allergies  Allergen Reactions  . Aspirin Other (See Comments)    Stomach upset  . Oxycodone-Acetaminophen Itching  . Latex Rash    Review of Systems:  CONSTITUTIONAL: No fevers, chills, night sweats, or weight loss.  EYES: No visual changes or eye pain ENT: No hearing changes.  No history of nose bleeds.   RESPIRATORY: No cough, wheezing and shortness of breath.   CARDIOVASCULAR: Negative for chest pain, and palpitations.   GI: Negative for abdominal  discomfort, blood in stools or black stools.  No recent change in bowel habits.   GU:  No history of incontinence.   MUSCLOSKELETAL: +history of joint pain or swelling.  No myalgias.   SKIN: Negative for lesions, rash, and itching.   ENDOCRINE: Negative for cold or heat intolerance, polydipsia or goiter.   PSYCH:  No depression or anxiety symptoms.   NEURO: As Above.   Vital Signs:  BP 110/80   Pulse 82   Ht  (1.702 m)   Wt 202 lb 2 oz (91.7 kg)   SpO2 96%   BMI 31.66 kg/m   Neurological Exam: MENTAL STATUS including orientation to time, place, person, recent and remote memory, attention span and concentration, language, and fund of knowledge is normal.    CRANIAL NERVES: Face is symmetric.  MOTOR:  Motor strength is 5/5 in all extremities and 5-/5 in the toe extensors and toe flexors  MSRs:  Reflexes are 2+/4 in the upper extremities and absent in the legs.  SENSORY:  Gradient pattern of sensory loss to temperature, vibration, and pin prick distal to knees bilaterally, worse on the left  COORDINATION/GAIT:    Gait narrow based and stable.  He is able to perform tandem gait with only mild unsteadiness (improved)  Data: NCS/EMG of lower extremities 12/14/2015:  The electrophysiologic findings are most consistent with active on chronic sensorimotor polyneuropathy, predominantly axon loss in type, affecting the lower extremities. Overall, these findings are severe in degree electrically.  IMPRESSION/PLAN: 1. Painful distal and symmetric diabetic polyneuropathy       - NCS/EMG showed active on chronic axonal sensorimotor polyneuropathy affecting the lower extremities, severe in degree electrically       - Continue gabapentin  twice daily which he is tolerating and providing adequate relief.        - He did not have any favorable benefit with nortriptyline and does not want to try anything new.  Consider Cymbalta going forward       - For worsening paresthesias, he can try  Aspercream and if this helps, consider lidocaine ointment  2.  Peripheral vascular disease s/p left fem-pop bypass, followed by vascular surgery  Return to clinic as needed   The duration of this appointment visit was 20 minutes of face-to-face time with the patient.  Greater than 50% of this time was spent in counseling, explanation of diagnosis, planning of further management, and coordination of care.   Thank you for allowing me to participate in patient's care.  If I can answer any additional questions, I would be pleased to do so.    Sincerely,    Pretty Weltman K. Allena Katz, DO

## 2016-07-01 NOTE — Patient Instructions (Signed)
1.  Continue your medications as you are taking them 2.  You can try over the counter Aspercream to your feet for the burning  Return to clinic if you symptoms worsen

## 2016-07-06 ENCOUNTER — Other Ambulatory Visit: Payer: Self-pay | Admitting: Physician Assistant

## 2016-08-22 ENCOUNTER — Encounter: Payer: Self-pay | Admitting: Family

## 2016-08-23 ENCOUNTER — Ambulatory Visit (INDEPENDENT_AMBULATORY_CARE_PROVIDER_SITE_OTHER): Payer: Medicare Other | Admitting: Ophthalmology

## 2016-08-28 ENCOUNTER — Ambulatory Visit (INDEPENDENT_AMBULATORY_CARE_PROVIDER_SITE_OTHER)
Admission: RE | Admit: 2016-08-28 | Discharge: 2016-08-28 | Disposition: A | Discharge status: Home / Self Care | Payer: Medicare Other | Source: Ambulatory Visit | Attending: Vascular Surgery | Admitting: Vascular Surgery

## 2016-08-28 ENCOUNTER — Ambulatory Visit (HOSPITAL_COMMUNITY)
Admission: RE | Admit: 2016-08-28 | Discharge: 2016-08-28 | Disposition: A | Discharge status: Home / Self Care | Payer: Medicare Other | Source: Ambulatory Visit | Attending: Vascular Surgery | Admitting: Vascular Surgery

## 2016-08-28 ENCOUNTER — Ambulatory Visit (INDEPENDENT_AMBULATORY_CARE_PROVIDER_SITE_OTHER): Payer: Medicare Other | Admitting: Family

## 2016-08-28 ENCOUNTER — Encounter: Payer: Self-pay | Admitting: Family

## 2016-08-28 VITALS — BP 139/84 | HR 131 | Temp 97.2°F | Resp 16 | Ht 67.0 in | Wt 208.0 lb

## 2016-08-28 DIAGNOSIS — Z720 Tobacco use: Secondary | ICD-10-CM

## 2016-08-28 DIAGNOSIS — I70244 Atherosclerosis of native arteries of left leg with ulceration of heel and midfoot: Secondary | ICD-10-CM

## 2016-08-28 DIAGNOSIS — F17200 Nicotine dependence, unspecified, uncomplicated: Secondary | ICD-10-CM

## 2016-08-28 DIAGNOSIS — Z95828 Presence of other vascular implants and grafts: Secondary | ICD-10-CM | POA: Insufficient documentation

## 2016-08-28 DIAGNOSIS — E1151 Type 2 diabetes mellitus with diabetic peripheral angiopathy without gangrene: Secondary | ICD-10-CM

## 2016-08-28 DIAGNOSIS — I779 Disorder of arteries and arterioles, unspecified: Secondary | ICD-10-CM | POA: Diagnosis not present

## 2016-08-28 LAB — VAS US LOWER EXTREMITY BYPASS GRAFT DUPLEX
RSFPPSV: 168 cm/s
Right popliteal dist sys PSV: -120 cm/s

## 2016-08-28 NOTE — Patient Instructions (Signed)
Peripheral Vascular Disease Peripheral vascular disease (PVD) is a disease of the blood vessels that are not part of your heart and brain. A simple term for PVD is poor circulation. In most cases, PVD narrows the blood vessels that carry blood from your heart to the rest of your body. This can result in a decreased supply of blood to your arms, legs, and internal organs, like your stomach or kidneys. However, it most often affects a person's lower legs and feet. There are two types of PVD.  Organic PVD. This is the more common type. It is caused by damage to the structure of blood vessels.  Functional PVD. This is caused by conditions that make blood vessels contract and tighten (spasm). Without treatment, PVD tends to get worse over time. PVD can also lead to acute ischemic limb. This is when an arm or limb suddenly has trouble getting enough blood. This is a medical emergency. CAUSES Each type of PVD has many different causes. The most common cause of PVD is buildup of a fatty material (plaque) inside of your arteries (atherosclerosis). Small amounts of plaque can break off from the walls of the blood vessels and become lodged in a smaller artery. This blocks blood flow and can cause acute ischemic limb. Other common causes of PVD include:  Blood clots that form inside of blood vessels.  Injuries to blood vessels.  Diseases that cause inflammation of blood vessels or cause blood vessel spasms.  Health behaviors and health history that increase your risk of developing PVD. RISK FACTORS  You may have a greater risk of PVD if you:  Have a family history of PVD.  Have certain medical conditions, including:  High cholesterol.  Diabetes.  High blood pressure (hypertension).  Coronary heart disease.  Past problems with blood clots.  Past injury, such as burns or a broken bone. These may have damaged blood vessels in your limbs.  Buerger disease. This is caused by inflamed blood  vessels in your hands and feet.  Some forms of arthritis.  Rare birth defects that affect the arteries in your legs.  Use tobacco.  Do not get enough exercise.  Are obese.  Are age 50 or older. SIGNS AND SYMPTOMS  PVD may cause many different symptoms. Your symptoms depend on what part of your body is not getting enough blood. Some common signs and symptoms include:  Cramps in your lower legs. This may be a symptom of poor leg circulation (claudication).  Pain and weakness in your legs while you are physically active that goes away when you rest (intermittent claudication).  Leg pain when at rest.  Leg numbness, tingling, or weakness.  Coldness in a leg or foot, especially when compared with the other leg.  Skin or hair changes. These can include:  Hair loss.  Shiny skin.  Pale or bluish skin.  Thick toenails.  Inability to get or maintain an erection (erectile dysfunction). People with PVD are more prone to developing ulcers and sores on their toes, feet, or legs. These may take longer than normal to heal. DIAGNOSIS Your health care provider may diagnose PVD from your signs and symptoms. The health care provider will also do a physical exam. You may have tests to find out what is causing your PVD and determine its severity. Tests may include:  Blood pressure recordings from your arms and legs and measurements of the strength of your pulses (pulse volume recordings).  Imaging studies using sound waves to take pictures of   the blood flow through your blood vessels (Doppler ultrasound).  Injecting a dye into your blood vessels before having imaging studies using:  X-rays (angiogram or arteriogram).  Computer-generated X-rays (CT angiogram).  A powerful electromagnetic field and a computer (magnetic resonance angiogram or MRA). TREATMENT Treatment for PVD depends on the cause of your condition and the severity of your symptoms. It also depends on your age. Underlying  causes need to be treated and controlled. These include long-lasting (chronic) conditions, such as diabetes, high cholesterol, and high blood pressure. You may need to first try making lifestyle changes and taking medicines. Surgery may be needed if these do not work. Lifestyle changes may include:  Quitting smoking.  Exercising regularly.  Following a low-fat, low-cholesterol diet. Medicines may include:  Blood thinners to prevent blood clots.  Medicines to improve blood flow.  Medicines to improve your blood cholesterol levels. Surgical procedures may include:  A procedure that uses an inflated balloon to open a blocked artery and improve blood flow (angioplasty).  A procedure to put in a tube (stent) to keep a blocked artery open (stent implant).  Surgery to reroute blood flow around a blocked artery (peripheral bypass surgery).  Surgery to remove dead tissue from an infected wound on the affected limb.  Amputation. This is surgical removal of the affected limb. This may be necessary in cases of acute ischemic limb that are not improved through medical or surgical treatments. HOME CARE INSTRUCTIONS  Take medicines only as directed by your health care provider.  Do not use any tobacco products, including cigarettes, chewing tobacco, or electronic cigarettes. If you need help quitting, ask your health care provider.  Lose weight if you are overweight, and maintain a healthy weight as directed by your health care provider.  Eat a diet that is low in fat and cholesterol. If you need help, ask your health care provider.  Exercise regularly. Ask your health care provider to suggest some good activities for you.  Use compression stockings or other mechanical devices as directed by your health care provider.  Take good care of your feet.  Wear comfortable shoes that fit well.  Check your feet often for any cuts or sores. SEEK MEDICAL CARE IF:  You have cramps in your legs  while walking.  You have leg pain when you are at rest.  You have coldness in a leg or foot.  Your skin changes.  You have erectile dysfunction.  You have cuts or sores on your feet that are not healing. SEEK IMMEDIATE MEDICAL CARE IF:  Your arm or leg turns cold and blue.  Your arms or legs become red, warm, swollen, painful, or numb.  You have chest pain or trouble breathing.  You suddenly have weakness in your face, arm, or leg.  You become very confused or lose the ability to speak.  You suddenly have a very bad headache or lose your vision.   This information is not intended to replace advice given to you by your health care provider. Make sure you discuss any questions you have with your health care provider.   Document Released: 01/02/2005 Document Revised: 12/16/2014 Document Reviewed: 05/05/2014 Elsevier Interactive Patient Education 2016 [employer]     Steps to Quit Smoking  Smoking tobacco can be harmful to your health and can affect almost every organ in your body. Smoking puts you, and those around you, at risk for developing many serious chronic diseases. Quitting smoking is difficult, but it is one of   the best things that you can do for your health. It is never too late to quit. WHAT ARE THE BENEFITS OF QUITTING SMOKING? When you quit smoking, you lower your risk of developing serious diseases and conditions, such as:  Lung cancer or lung disease, such as COPD.  Heart disease.  Stroke.  Heart attack.  Infertility.  Osteoporosis and bone fractures. Additionally, symptoms such as coughing, wheezing, and shortness of breath may get better when you quit. You may also find that you get sick less often because your body is stronger at fighting off colds and infections. If you are pregnant, quitting smoking can help to reduce your chances of having a baby of low birth weight. HOW DO I GET READY TO QUIT? When you decide to quit smoking, create a plan to  make sure that you are successful. Before you quit:  Pick a date to quit. Set a date within the next two weeks to give you time to prepare.  Write down the reasons why you are quitting. Keep this list in places where you will see it often, such as on your bathroom mirror or in your car or wallet.  Identify the people, places, things, and activities that make you want to smoke (triggers) and avoid them. Make sure to take these actions:  Throw away all cigarettes at home, at work, and in your car.  Throw away smoking accessories, such as ashtrays and lighters.  Clean your car and make sure to empty the ashtray.  Clean your home, including curtains and carpets.  Tell your family, friends, and coworkers that you are quitting. Support from your loved ones can make quitting easier.  Talk with your health care provider about your options for quitting smoking.  Find out what treatment options are covered by your health insurance. WHAT STRATEGIES CAN I USE TO QUIT SMOKING?  Talk with your healthcare provider about different strategies to quit smoking. Some strategies include:  Quitting smoking altogether instead of gradually lessening how much you smoke over a period of time. Research shows that quitting "cold turkey" is more successful than gradually quitting.  Attending in-person counseling to help you build problem-solving skills. You are more likely to have success in quitting if you attend several counseling sessions. Even short sessions of 10 minutes can be effective.  Finding resources and support systems that can help you to quit smoking and remain smoke-free after you quit. These resources are most helpful when you use them often. They can include:  Online chats with a counselor.  Telephone quitlines.  Printed self-help materials.  Support groups or group counseling.  Text messaging programs.  Mobile phone applications.  Taking medicines to help you quit smoking. (If you are  pregnant or breastfeeding, talk with your health care provider first.) Some medicines contain nicotine and some do not. Both types of medicines help with cravings, but the medicines that include nicotine help to relieve withdrawal symptoms. Your health care provider may recommend:  Nicotine patches, gum, or lozenges.  Nicotine inhalers or sprays.  Non-nicotine medicine that is taken by mouth. Talk with your health care provider about combining strategies, such as taking medicines while you are also receiving in-person counseling. Using these two strategies together makes you more likely to succeed in quitting than if you used either strategy on its own. If you are pregnant or breastfeeding, talk with your health care provider about finding counseling or other support strategies to quit smoking. Do not take medicine to help you   quit smoking unless told to do so by your health care provider. WHAT THINGS CAN I DO TO MAKE IT EASIER TO QUIT? Quitting smoking might feel overwhelming at first, but there is a lot that you can do to make it easier. Take these important actions:  Reach out to your family and friends and ask that they support and encourage you during this time. Call telephone quitlines, reach out to support groups, or work with a counselor for support.  Ask people who smoke to avoid smoking around you.  Avoid places that trigger you to smoke, such as bars, parties, or smoke-break areas at work.  Spend time around people who do not smoke.  Lessen stress in your life, because stress can be a smoking trigger for some people. To lessen stress, try:  Exercising regularly.  Deep-breathing exercises.  Yoga.  Meditating.  Performing a body scan. This involves closing your eyes, scanning your body from head to toe, and noticing which parts of your body are particularly tense. Purposefully relax the muscles in those areas.  Download or purchase mobile phone or tablet apps (applications)  that can help you stick to your quit plan by providing reminders, tips, and encouragement. There are many free apps, such as QuitGuide from the CDC (Centers for Disease Control and Prevention). You can find other support for quitting smoking (smoking cessation) through smokefree.gov and other websites. HOW WILL I FEEL WHEN I QUIT SMOKING? Within the first 24 hours of quitting smoking, you may start to feel some withdrawal symptoms. These symptoms are usually most noticeable 2-3 days after quitting, but they usually do not last beyond 2-3 weeks. Changes or symptoms that you might experience include:  Mood swings.  Restlessness, anxiety, or irritation.  Difficulty concentrating.  Dizziness.  Strong cravings for sugary foods in addition to nicotine.  Mild weight gain.  Constipation.  Nausea.  Coughing or a sore throat.  Changes in how your medicines work in your body.  A depressed mood.  Difficulty sleeping (insomnia). After the first 2-3 weeks of quitting, you may start to notice more positive results, such as:  Improved sense of smell and taste.  Decreased coughing and sore throat.  Slower heart rate.  Lower blood pressure.  Clearer skin.  The ability to breathe more easily.  Fewer sick days. Quitting smoking is very challenging for most people. Do not get discouraged if you are not successful the first time. Some people need to make many attempts to quit before they achieve long-term success. Do your best to stick to your quit plan, and talk with your health care provider if you have any questions or concerns.   This information is not intended to replace advice given to you by your health care provider. Make sure you discuss any questions you have with your health care provider.   Document Released: 11/19/2001 Document Revised: 04/11/2015 Document Reviewed: 04/11/2015 Elsevier Interactive Patient Education 2016 [employer]  

## 2016-08-28 NOTE — Progress Notes (Signed)
[facility] [place]   CC: Follow up peripheral artery occlusive disease  History of Present Illness ALLAH REASON is a 65 y.o. male patient of Dr. Hart Rochester who is s/p left femoral popliteal bpg using saphenous vein graft on 01/01/2016 for nonhealing ulcers left foot.  He returns today for routine follow up. Pt denies any claudication sx's with walking since the January 2017 left leg bpg. The wounds on his left foot have healed and he was released from the wound care center on 05/20/16. He walks all day. He denies any hx of stroke or TIA. He reports an MI in 1999, no subsequent MI.  He unintentionally lost 30 pounds since January, 2017. He denies post prandial abdominal pain.   He denies chest pain, denies dyspnea, denies feeling light headed.   0.8 creatinine in January 2017 (review of records).  Pt Diabetic: Yes, states his last A1C was 5.7, improved from 7.7 Pt smoker: smoker  (1/2 ppd, started at age 89 yrs), states his mother has dementia, is stressed by this, lives with her  Pt meds include: Statin :Yes Betablocker: Yes ASA: No, is allergic Other anticoagulants/antiplatelets: Plavix    Past Medical History:  Diagnosis Date  . Arthritis   . Diabetes (HCC)   . Diabetes mellitus Jan. 1999  . High cholesterol   . Hypertension   . Myocardial infarct (HCC)   . Peripheral vascular disease (HCC)   . Seasonal allergies   . Urine incontinence     Social History Social History  Substance Use Topics  . Smoking status: Current Every Day Smoker    Packs/day: 1.00    Years: 45.00    Types: Cigarettes  . Smokeless tobacco: Never Used     Comment: Reduced to 0.5ppd  . Alcohol use No    Family History Family History  Problem Relation Age of Onset  . Arthritis Mother   . Heart disease Maternal Grandmother   . Hypertension Mother   . Mental illness Maternal Grandmother     Past Surgical History:  Procedure Laterality Date  . CORONARY STENT  PLACEMENT  Jan.  1999  . FEMORAL-POPLITEAL BYPASS GRAFT Left 01/03/2016   Procedure: LEFT COMMON FEMORAL-BELOW KNEE POPLITEAL ARTERY BYPASS GRAFT USING NON REVERSED TRANSLOCATED SAPHENOUS VEIN GRAFT;  Surgeon: Pryor Ochoa, MD;  Location: [facility] OR;  Service: Vascular;  Laterality: Left;  . INTRAOPERATIVE ARTERIOGRAM Left 01/03/2016   Procedure: INTRA OPERATIVE ARTERIOGRAM LEFT LEG;  Surgeon: Pryor Ochoa, MD;  Location: [facility] OR;  Service: Vascular;  Laterality: Left;  . PERIPHERAL VASCULAR CATHETERIZATION N/A 01/01/2016   Procedure: Abdominal Aortogram w/Lower Extremity;  Surgeon: Fransisco Hertz, MD;  Location: [facility] INVASIVE CV LAB;  Service: Cardiovascular;  Laterality: N/A;  . PERIPHERAL VASCULAR CATHETERIZATION Left 01/01/2016   Procedure: Peripheral Vascular Balloon Angioplasty;  Surgeon: Fransisco Hertz, MD;  Location: [facility] INVASIVE CV LAB;  Service: Cardiovascular;  Laterality: Left;  Popliteal artery    Allergies  Allergen Reactions  . Aspirin Other (See Comments)    Stomach upset  . Oxycodone-Acetaminophen Itching  . Latex Rash    Current Outpatient Prescriptions  Medication Sig Dispense Refill  . acetaminophen (TYLENOL) 650 MG CR tablet Take 1,300 mg by mouth 2 (two) times daily.    . carvedilol (COREG) 3.125 MG tablet Take 1 tablet (3.125 mg total) by mouth 2 (two) times daily with a meal. 60 tablet 3  . clopidogrel (PLAVIX) 75 MG tablet Take 1 tablet (75 mg total) by mouth  daily. 90 tablet 2  . collagenase (SANTYL) ointment Apply 1 application topically daily. 15 g 0  . gabapentin (NEURONTIN) 300 MG capsule Take 900 mg by mouth 2 (two) times daily.   0  . glimepiride (AMARYL) 4 MG tablet Take 4 mg by mouth 2 (two) times daily.    Marland Kitchen. glucose blood (FREESTYLE LITE) test strip Use as instructed 100 each 12  . glucose blood test strip Use as instructed 100 each 12  . Lancets (FREESTYLE) lancets Use as instructed 100 each 12  . lisinopril (PRINIVIL,ZESTRIL) 40 MG tablet Take 1 tablet (40 mg  total) by mouth daily. 90 tablet 3  . metFORMIN (GLUCOPHAGE) 1000 MG tablet Take 1 tablet (1,000 mg total) by mouth 2 (two) times daily with a meal. 180 tablet 3  . nortriptyline (PAMELOR) 10 MG capsule Take nortriptyline 10mg  at bedtime for 2 week, then increase to 2 tablet at bedtime (Patient not taking: Reported on 07/01/2016) 60 capsule 5  . pravastatin (PRAVACHOL) 40 MG tablet Take 1 tablet (40 mg total) by mouth daily. 90 tablet 3  . PRESCRIPTION MEDICATION Inhale into the lungs at bedtime. CPAP    . traMADol (ULTRAM) 50 MG tablet Take 1-2 tablets by mouth every 6 hours as needed for pain. 30 tablet 0  . traMADol (ULTRAM) 50 MG tablet Take by mouth every 6 (six) hours as needed.    . traZODone (DESYREL) 100 MG tablet   5   No current facility-administered medications for this visit.     ROS: See HPI for pertinent positives and negatives.   Physical Examination  Vitals:   08/28/16 1418  BP: 140/80  Pulse: (!) 131  Resp: 16  Temp: 97.2 F (36.2 C)  TempSrc: Oral  SpO2: 92%  Weight: 208 lb (94.3 kg)  Height: 5\' 7"  (1.702 m)   Body mass index is 32.58 kg/m.  General: A&O x 3, WDWN, obese male. Gait: normal Eyes: PERRLA. Pulmonary: CTAB, without wheezes , rales or rhonchi. Cardiac: regular rhythm and rate with occasional missed contractions, no detected murmur.         Carotid Bruits Right Left   Negative Negative  Aorta is not palpable. Radial pulses: are 2+ palpable                           VASCULAR EXAM: Extremities no ischemic changes, no Gangrene; no open wounds. Left lower leg with 1+ pitting and non pitting edema. The wounds on his left foot have healed.                                                                                                                                                       LE Pulses Right Left       FEMORAL  2+ palpable  2+ palpable       POPLITEAL  not palpable  not palpable       POSTERIOR TIBIAL  not palpable  not  palpable       DORSALIS PEDIS      ANTERIOR TIBIAL not palpable not palpable   Abdomen: soft, NT, no palpable masses. Skin: no rashes, no ulcers. Musculoskeletal: no muscle wasting or atrophy.         Neurologic: A&O X 3; Appropriate Affect ; SENSATION: normal; MOTOR FUNCTION:  moving all extremities equally, motor strength 5/5 throughout. Speech is fluent/normal. CN 2-12 intact.    ASSESSMENT: STEEL KERNEY is a 65 y.o. male who is s/p left femoral to below-knee popliteal bypass with saphenous vein on 01/03/2016.  Pt denies any claudication sx's with walking since the January 2017 left leg bpg. The wounds on his left foot have healed and he was released from the wound care center on 05/20/16. He walks all day.  He has no signs of ischemia in his feet/legs. Pulse recheck is 72/minute with occasional missed contractions.   DATA Left LE arterial duplex today suggests a pulsatile, turbulent left popliteal vein flow which appears c/w a possible arteriovenous communication. Left LE bypass graft with no restenosis based on limited visualization.  ABI has improved slightly in the right leg from 86% to 91% arterial perfusion (tri and monophasic waveforms), and worsened slightly in the left leg from 86% arterial perfusion to 78% bi and monophasic waveforms.   His atherosclerotic risk factors include continued smoking and controlled DM.   At pt's 05/23/16 visit, I discussed with Dr. Imogene Burn the pulsatile, turbulent left popliteal vein flow which appears c/w a possible arteriovenous communication. This is not a worrisome finding, will monitor in subsequent arterial duplex of left LE.     PLAN:  The patient was counseled re smoking cessation and given several free resources re smoking cessation.   Based on the patient's vascular studies and examination, pt will return to clinic in 3 months with ABI's and left LE arterial duplex, according to postoperative surveillance timeline. .   I  discussed in depth with the patient the nature of atherosclerosis, and emphasized the importance of maximal medical management including strict control of blood pressure, blood glucose, and lipid levels, obtaining regular exercise, and continued cessation of smoking.  The patient is aware that without maximal medical management the underlying atherosclerotic disease process will progress, limiting the benefit of any interventions.  The patient was given information about PAD including signs, symptoms, treatment, what symptoms should prompt the patient to seek immediate medical care, and risk reduction measures to take.  Charisse March, RN, MSN, FNP-C [facility] [place] Phone: [phone]  Clinic MD: Edilia Bo  08/28/16 2:21 PM

## 2016-08-29 NOTE — Addendum Note (Signed)
Addended by: Yolonda KidaEVANS, ASHLEIGH N on: 08/29/2016 12:43 PM   Modules accepted: Orders

## 2016-09-12 ENCOUNTER — Ambulatory Visit (INDEPENDENT_AMBULATORY_CARE_PROVIDER_SITE_OTHER): Payer: Medicare Other | Admitting: Ophthalmology

## 2016-09-12 DIAGNOSIS — H353121 Nonexudative age-related macular degeneration, left eye, early dry stage: Secondary | ICD-10-CM | POA: Diagnosis not present

## 2016-09-12 DIAGNOSIS — E113391 Type 2 diabetes mellitus with moderate nonproliferative diabetic retinopathy without macular edema, right eye: Secondary | ICD-10-CM | POA: Diagnosis not present

## 2016-09-12 DIAGNOSIS — H43813 Vitreous degeneration, bilateral: Secondary | ICD-10-CM | POA: Diagnosis not present

## 2016-09-12 DIAGNOSIS — E113293 Type 2 diabetes mellitus with mild nonproliferative diabetic retinopathy without macular edema, bilateral: Secondary | ICD-10-CM | POA: Diagnosis not present

## 2016-09-12 DIAGNOSIS — I10 Essential (primary) hypertension: Secondary | ICD-10-CM | POA: Diagnosis not present

## 2016-09-12 DIAGNOSIS — E11319 Type 2 diabetes mellitus with unspecified diabetic retinopathy without macular edema: Secondary | ICD-10-CM | POA: Diagnosis not present

## 2016-09-12 DIAGNOSIS — H35033 Hypertensive retinopathy, bilateral: Secondary | ICD-10-CM

## 2016-11-21 ENCOUNTER — Encounter: Payer: Self-pay | Admitting: Family

## 2016-11-26 ENCOUNTER — Other Ambulatory Visit: Payer: Self-pay | Admitting: Vascular Surgery

## 2016-11-26 DIAGNOSIS — E119 Type 2 diabetes mellitus without complications: Secondary | ICD-10-CM

## 2016-11-28 ENCOUNTER — Ambulatory Visit (INDEPENDENT_AMBULATORY_CARE_PROVIDER_SITE_OTHER)
Admission: RE | Admit: 2016-11-28 | Discharge: 2016-11-28 | Disposition: A | Discharge status: Home / Self Care | Payer: Medicare Other | Source: Ambulatory Visit | Attending: Family | Admitting: Family

## 2016-11-28 ENCOUNTER — Ambulatory Visit (INDEPENDENT_AMBULATORY_CARE_PROVIDER_SITE_OTHER): Payer: Medicare Other | Admitting: Family

## 2016-11-28 ENCOUNTER — Ambulatory Visit (HOSPITAL_COMMUNITY)
Admission: RE | Admit: 2016-11-28 | Discharge: 2016-11-28 | Disposition: A | Discharge status: Home / Self Care | Payer: Medicare Other | Source: Ambulatory Visit | Attending: Family | Admitting: Family

## 2016-11-28 ENCOUNTER — Other Ambulatory Visit: Payer: Self-pay

## 2016-11-28 ENCOUNTER — Encounter: Payer: Self-pay | Admitting: Family

## 2016-11-28 VITALS — BP 155/96 | HR 82 | Temp 97.3°F | Resp 16 | Ht 67.0 in | Wt 200.0 lb

## 2016-11-28 DIAGNOSIS — E1151 Type 2 diabetes mellitus with diabetic peripheral angiopathy without gangrene: Secondary | ICD-10-CM

## 2016-11-28 DIAGNOSIS — Z95828 Presence of other vascular implants and grafts: Secondary | ICD-10-CM | POA: Diagnosis not present

## 2016-11-28 DIAGNOSIS — F17200 Nicotine dependence, unspecified, uncomplicated: Secondary | ICD-10-CM | POA: Diagnosis not present

## 2016-11-28 DIAGNOSIS — I779 Disorder of arteries and arterioles, unspecified: Secondary | ICD-10-CM

## 2016-11-28 DIAGNOSIS — R0989 Other specified symptoms and signs involving the circulatory and respiratory systems: Secondary | ICD-10-CM | POA: Diagnosis present

## 2016-11-28 NOTE — Progress Notes (Signed)
[facility] [place]   CC: Follow up peripheral artery occlusive disease  History of Present Illness Clinton Harrington is a 65 y.o. male patient of Dr. Hart RochesterLawson who is s/p left femoral popliteal bpg using saphenous vein graft on 01/01/2016 for nonhealing ulcers left foot.  He returns today for routine follow up. Pt denies any claudication sx's with walking since the January 2017 left leg bpg. The wounds on his left foot have healed and he was released from the wound care center on 05/20/16. He walks all day, at least 1 hour daily. He denies any hx of stroke or TIA. He reports an MI in 1999, no subsequent MI.  He denies chest pain, denies dyspnea, denies feeling light headed.   He stratined his back recently trying to lift his tractor, is seeing a [facility].  0.8 creatinine in January 2017 (review of records).  Pt Diabetic: Yes, states his last A1C was 5.7, improved from 7.7 Pt smoker: smoker (1/2 ppd, started at age 65 yrs), states his mother has dementia, is stressed by this, lives with her  Pt meds include: Statin :Yes Betablocker: Yes ASA: No, is allergic Other anticoagulants/antiplatelets: Plavix   Past Medical History:  Diagnosis Date  . Arthritis   . Diabetes (HCC)   . Diabetes mellitus Jan. 1999  . High cholesterol   . Hypertension   . Myocardial infarct   . Peripheral vascular disease (HCC)   . Seasonal allergies   . Urine incontinence     Social History Social History  Substance Use Topics  . Smoking status: Current Every Day Smoker    Packs/day: 0.50    Years: 45.00    Types: Cigarettes  . Smokeless tobacco: Never Used     Comment: 1/2 pk per day  . Alcohol use No    Family History Family History  Problem Relation Age of Onset  . Arthritis Mother   . Heart disease Maternal Grandmother   . Hypertension Mother   . Mental illness Maternal Grandmother     Past Surgical History:  Procedure Laterality Date  . CORONARY  STENT PLACEMENT  Jan.  1999  . FEMORAL-POPLITEAL BYPASS GRAFT Left 01/03/2016   Procedure: LEFT COMMON FEMORAL-BELOW KNEE POPLITEAL ARTERY BYPASS GRAFT USING NON REVERSED TRANSLOCATED SAPHENOUS VEIN GRAFT;  Surgeon: Pryor OchoaJames D Lawson, MD;  [facility];  Service: Vascular;  Laterality: Left;  . INTRAOPERATIVE ARTERIOGRAM Left 01/03/2016   Procedure: INTRA OPERATIVE ARTERIOGRAM LEFT LEG;  Surgeon: Pryor OchoaJames D Lawson, MD;  [facility];  Service: Vascular;  Laterality: Left;  . PERIPHERAL VASCULAR CATHETERIZATION N/A 01/01/2016   Procedure: Abdominal Aortogram w/Lower Extremity;  Surgeon: Fransisco HertzBrian L Chen, MD;  [facility];  Service: Cardiovascular;  Laterality: N/A;  . PERIPHERAL VASCULAR CATHETERIZATION Left 01/01/2016   Procedure: Peripheral Vascular Balloon Angioplasty;  Surgeon: Fransisco HertzBrian L Chen, MD;  [facility];  Service: Cardiovascular;  Laterality: Left;  Popliteal artery    Allergies  Allergen Reactions  . Aspirin Other (See Comments)    Stomach upset  . Oxycodone-Acetaminophen Itching  . Latex Rash    Current Outpatient Prescriptions  Medication Sig Dispense Refill  . acetaminophen (TYLENOL) 650 MG CR tablet Take 1,300 mg by mouth 2 (two) times daily.    . carvedilol (COREG) 3.125 MG tablet Take 1 tablet (3.125 mg total) by mouth 2 (two) times daily with a meal. 60 tablet 3  . collagenase (SANTYL) ointment Apply 1 application topically daily. 15 g 0  .  gabapentin (NEURONTIN) 300 MG capsule Take 900 mg by mouth 2 (two) times daily.   0  . glimepiride (AMARYL) 4 MG tablet Take 4 mg by mouth 2 (two) times daily.    Marland Kitchen. glucose blood (FREESTYLE LITE) test strip Use as instructed 100 each 12  . glucose blood test strip Use as instructed 100 each 12  . Lancets (FREESTYLE) lancets Use as instructed 100 each 12  . lisinopril (PRINIVIL,ZESTRIL) 40 MG tablet Take 1 tablet (40 mg total) by mouth daily. 90 tablet 3  . metFORMIN (GLUCOPHAGE) 1000 MG tablet Take 1 tablet  (1,000 mg total) by mouth 2 (two) times daily with a meal. 180 tablet 3  . nortriptyline (PAMELOR) 10 MG capsule Take nortriptyline 10mg  at bedtime for 2 week, then increase to 2 tablet at bedtime 60 capsule 5  . pravastatin (PRAVACHOL) 40 MG tablet Take 1 tablet (40 mg total) by mouth daily. 90 tablet 3  . PRESCRIPTION MEDICATION Inhale into the lungs at bedtime. CPAP    . traMADol (ULTRAM) 50 MG tablet Take 1-2 tablets by mouth every 6 hours as needed for pain. 30 tablet 0  . traMADol (ULTRAM) 50 MG tablet Take by mouth every 6 (six) hours as needed.    . traZODone (DESYREL) 100 MG tablet   5   No current facility-administered medications for this visit.     ROS: See HPI for pertinent positives and negatives.   Physical Examination  Vitals:   11/28/16 1104 11/28/16 1106  BP: (!) 171/96 (!) 155/96  Pulse: 82   Resp: 16   Temp: 97.3 F (36.3 C)   TempSrc: Oral   SpO2: 99%   Weight: 200 lb (90.7 kg)   Height: 5\' 7"  (1.702 m)    Body mass index is 31.32 kg/m.  General: A&O x 3, WDWN, obese male. Gait: normal Eyes: PERRLA. Pulmonary: CTAB, without wheezes , rales or rhonchi. Cardiac: regular rhythm and rate, no detected murmur.    Carotid Bruits Right Left   Negative Negative  Aorta is not palpable. Radial pulses: are 2+ palpable   VASCULAR EXAM: Extremitiesno ischemic changes, no Gangrene; no open wounds. Left foot with 1+ pitting and non pitting edema. The wounds on his left foot have healed.  LE Pulses Right Left  FEMORAL 1+ palpable 2+ palpable  POPLITEAL not palpable not palpable  POSTERIOR TIBIAL not palpable not palpable  DORSALIS PEDIS ANTERIOR TIBIAL not palpable not palpable   Abdomen: soft, NT, no palpable masses. Skin: no rashes, no ulcers. Musculoskeletal: no muscle wasting or atrophy. Neurologic: A&O X 3; Appropriate Affect ; SENSATION: normal; MOTOR FUNCTION: moving  all extremities equally, motor strength 5/5 throughout. Speech is fluent/normal. CN 2-12 intact.    ASSESSMENT: Clinton Harrington is a 65 y.o. male who is s/p left femoral to below-knee popliteal bypass with saphenous vein on 01/03/2016.  Pt denies any claudication sx's with walking since the January 2017 left leg bpg. The wounds on his left foot have healed and he was released from the wound care center on 05/20/16. He walks all day.  He has no signs of ischemia in his feet/legs.  His atherosclerotic risk factors include continued smoking and controlled DM.   At pt's 05/23/16 visit, I discussed with Dr. Imogene Burnhen the pulsatile, turbulent left popliteal vein flow which appears c/w a possible arteriovenous communication. This is not a worrisome finding, will monitor in subsequent arterial duplex of left LE.   Bilateral ABI's improved slightly, but distal graft stenosis has  worsened, see DATA.     DATA  Left LE arterial duplex today demonstrates greater than 70% distal graft stenosis (451 cm/s today, compared to 152 cm/s on 08-28-16), difficult to visualize due to depth of graft.  Triphasic waveforms in the proximal and mid graft, stenotic at the distal graft, monophasic at the distal anastomosis  ABI's: Right: 0.94 (0.91, 08-28-16), bi and triphasic waveforms, TBI: 0.71 (normal) (0.58) Left: 0.85 (0.78), biphasic waveforms, TBI: 0.51 (0.55)  PLAN:  Based on the patient's vascular studies and examination, and after discussing with Dr. Darrick Penna, pt will be scheduled for arteriogram with bilateral run off, possible intervention, on 12-04-16 by Dr. Randie Heinz.   The patient was counseled re smoking cessation and given several free resources re smoking cessation.   I discussed in depth with the patient the nature of atherosclerosis, and emphasized the importance of maximal medical management including strict control of blood pressure, blood glucose, and lipid levels, obtaining regular exercise, and  cessation of smoking.  The patient is aware that without maximal medical management the underlying atherosclerotic disease process will progress, limiting the benefit of any interventions.  The patient was given information about PAD including signs, symptoms, treatment, what symptoms should prompt the patient to seek immediate medical care, and risk reduction measures to take.  Charisse March, RN, MSN, FNP-C [facility] Phone: [phone]  Clinic MD: [facility]  11/28/16 11:13 AM

## 2016-11-28 NOTE — Patient Instructions (Signed)
Peripheral Vascular Disease Peripheral vascular disease (PVD) is a disease of the blood vessels that are not part of your heart and brain. A simple term for PVD is poor circulation. In most cases, PVD narrows the blood vessels that carry blood from your heart to the rest of your body. This can result in a decreased supply of blood to your arms, legs, and internal organs, like your stomach or kidneys. However, it most often affects a person's lower legs and feet. There are two types of PVD.  Organic PVD. This is the more common type. It is caused by damage to the structure of blood vessels.  Functional PVD. This is caused by conditions that make blood vessels contract and tighten (spasm). Without treatment, PVD tends to get worse over time. PVD can also lead to acute ischemic limb. This is when an arm or limb suddenly has trouble getting enough blood. This is a medical emergency. Follow these instructions at home:  Take medicines only as told by your doctor.  Do not use any tobacco products, including cigarettes, chewing tobacco, or electronic cigarettes. If you need help quitting, ask your doctor.  Lose weight if you are overweight, and maintain a healthy weight as told by your doctor.  Eat a diet that is low in fat and cholesterol. If you need help, ask your doctor.  Exercise regularly. Ask your doctor for some good activities for you.  Take good care of your feet.  Wear comfortable shoes that fit well.  Check your feet often for any cuts or sores. Contact a doctor if:  You have cramps in your legs while walking.  You have leg pain when you are at rest.  You have coldness in a leg or foot.  Your skin changes.  You are unable to get or have an erection (erectile dysfunction).  You have cuts or sores on your feet that are not healing. Get help right away if:  Your arm or leg turns cold and blue.  Your arms or legs become red, warm, swollen, painful, or numb.  You have  chest pain or trouble breathing.  You suddenly have weakness in your face, arm, or leg.  You become very confused or you cannot speak.  You suddenly have a very bad headache.  You suddenly cannot see. This information is not intended to replace advice given to you by your health care provider. Make sure you discuss any questions you have with your health care provider. Document Released: 02/19/2010 Document Revised: 05/02/2016 Document Reviewed: 05/05/2014 Elsevier Interactive Patient Education  2017 Elsevier Inc.      Steps to Quit Smoking Smoking tobacco can be bad for your health. It can also affect almost every organ in your body. Smoking puts you and people around you at risk for many serious long-lasting (chronic) diseases. Quitting smoking is hard, but it is one of the best things that you can do for your health. It is never too late to quit. What are the benefits of quitting smoking? When you quit smoking, you lower your risk for getting serious diseases and conditions. They can include:  Lung cancer or lung disease.  Heart disease.  Stroke.  Heart attack.  Not being able to have children (infertility).  Weak bones (osteoporosis) and broken bones (fractures). If you have coughing, wheezing, and shortness of breath, those symptoms may get better when you quit. You may also get sick less often. If you are pregnant, quitting smoking can help to lower your chances   of having a baby of low birth weight. What can I do to help me quit smoking? Talk with your doctor about what can help you quit smoking. Some things you can do (strategies) include:  Quitting smoking totally, instead of slowly cutting back how much you smoke over a period of time.  Going to in-person counseling. You are more likely to quit if you go to many counseling sessions.  Using resources and support systems, such as:  Online chats with a counselor.  Phone quitlines.  Printed self-help  materials.  Support groups or group counseling.  Text messaging programs.  Mobile phone apps or applications.  Taking medicines. Some of these medicines may have nicotine in them. If you are pregnant or breastfeeding, do not take any medicines to quit smoking unless your doctor says it is okay. Talk with your doctor about counseling or other things that can help you. Talk with your doctor about using more than one strategy at the same time, such as taking medicines while you are also going to in-person counseling. This can help make quitting easier. What things can I do to make it easier to quit? Quitting smoking might feel very hard at first, but there is a lot that you can do to make it easier. Take these steps:  Talk to your family and friends. Ask them to support and encourage you.  Call phone quitlines, reach out to support groups, or work with a counselor.  Ask people who smoke to not smoke around you.  Avoid places that make you want (trigger) to smoke, such as:  Bars.  Parties.  Smoke-break areas at work.  Spend time with people who do not smoke.  Lower the stress in your life. Stress can make you want to smoke. Try these things to help your stress:  Getting regular exercise.  Deep-breathing exercises.  Yoga.  Meditating.  Doing a body scan. To do this, close your eyes, focus on one area of your body at a time from head to toe, and notice which parts of your body are tense. Try to relax the muscles in those areas.  Download or buy apps on your mobile phone or tablet that can help you stick to your quit plan. There are many free apps, such as QuitGuide from the CDC (Centers for Disease Control and Prevention). You can find more support from smokefree.gov and other websites. This information is not intended to replace advice given to you by your health care provider. Make sure you discuss any questions you have with your health care provider. Document Released:  09/21/2009 Document Revised: 07/23/2016 Document Reviewed: 04/11/2015 Elsevier Interactive Patient Education  2017 Elsevier Inc.  

## 2016-12-04 ENCOUNTER — Encounter (HOSPITAL_COMMUNITY)
Admission: RE | Disposition: A | Discharge status: Home / Self Care | Payer: Self-pay | Source: Ambulatory Visit | Attending: Vascular Surgery

## 2016-12-04 ENCOUNTER — Ambulatory Visit (HOSPITAL_COMMUNITY)
Admission: RE | Admit: 2016-12-04 | Discharge: 2016-12-05 | Disposition: A | Discharge status: Home / Self Care | Payer: Medicare Other | Source: Ambulatory Visit | Attending: Vascular Surgery | Admitting: Vascular Surgery

## 2016-12-04 ENCOUNTER — Encounter (HOSPITAL_COMMUNITY): Payer: Self-pay | Admitting: Vascular Surgery

## 2016-12-04 DIAGNOSIS — G8918 Other acute postprocedural pain: Secondary | ICD-10-CM

## 2016-12-04 DIAGNOSIS — I252 Old myocardial infarction: Secondary | ICD-10-CM | POA: Insufficient documentation

## 2016-12-04 DIAGNOSIS — Z91040 Latex allergy status: Secondary | ICD-10-CM | POA: Insufficient documentation

## 2016-12-04 DIAGNOSIS — M1990 Unspecified osteoarthritis, unspecified site: Secondary | ICD-10-CM | POA: Insufficient documentation

## 2016-12-04 DIAGNOSIS — E7800 Pure hypercholesterolemia, unspecified: Secondary | ICD-10-CM | POA: Diagnosis not present

## 2016-12-04 DIAGNOSIS — R32 Unspecified urinary incontinence: Secondary | ICD-10-CM | POA: Diagnosis not present

## 2016-12-04 DIAGNOSIS — I70302 Unspecified atherosclerosis of unspecified type of bypass graft(s) of the extremities, left leg: Secondary | ICD-10-CM | POA: Diagnosis not present

## 2016-12-04 DIAGNOSIS — F17210 Nicotine dependence, cigarettes, uncomplicated: Secondary | ICD-10-CM | POA: Insufficient documentation

## 2016-12-04 DIAGNOSIS — I739 Peripheral vascular disease, unspecified: Secondary | ICD-10-CM | POA: Diagnosis present

## 2016-12-04 DIAGNOSIS — Z955 Presence of coronary angioplasty implant and graft: Secondary | ICD-10-CM | POA: Insufficient documentation

## 2016-12-04 DIAGNOSIS — I10 Essential (primary) hypertension: Secondary | ICD-10-CM | POA: Insufficient documentation

## 2016-12-04 DIAGNOSIS — Z7984 Long term (current) use of oral hypoglycemic drugs: Secondary | ICD-10-CM | POA: Diagnosis not present

## 2016-12-04 DIAGNOSIS — Z7902 Long term (current) use of antithrombotics/antiplatelets: Secondary | ICD-10-CM | POA: Insufficient documentation

## 2016-12-04 DIAGNOSIS — Z8249 Family history of ischemic heart disease and other diseases of the circulatory system: Secondary | ICD-10-CM | POA: Diagnosis not present

## 2016-12-04 DIAGNOSIS — E1151 Type 2 diabetes mellitus with diabetic peripheral angiopathy without gangrene: Secondary | ICD-10-CM | POA: Insufficient documentation

## 2016-12-04 DIAGNOSIS — E119 Type 2 diabetes mellitus without complications: Secondary | ICD-10-CM

## 2016-12-04 DIAGNOSIS — E11649 Type 2 diabetes mellitus with hypoglycemia without coma: Secondary | ICD-10-CM | POA: Insufficient documentation

## 2016-12-04 DIAGNOSIS — T82858A Stenosis of vascular prosthetic devices, implants and grafts, initial encounter: Secondary | ICD-10-CM | POA: Diagnosis present

## 2016-12-04 HISTORY — PX: PERIPHERAL VASCULAR CATHETERIZATION: SHX172C

## 2016-12-04 LAB — CREATININE, SERUM
CREATININE: 0.83 mg/dL (ref 0.61–1.24)
GFR calc Af Amer: >60 mL/min (ref >60)
GFR calc non Af Amer: >60 mL/min (ref >60)

## 2016-12-04 LAB — GLUCOSE, CAPILLARY
GLUCOSE-CAPILLARY: 108 mg/dL — AB (ref 65–99)
GLUCOSE-CAPILLARY: 60 mg/dL — AB (ref 65–99)
GLUCOSE-CAPILLARY: 169 mg/dL — AB (ref 65–99)
GLUCOSE-CAPILLARY: 122 mg/dL — AB (ref 65–99)
Glucose-Capillary: 58 mg/dL — ABNORMAL LOW (ref 65–99)

## 2016-12-04 LAB — POCT ACTIVATED CLOTTING TIME
ACTIVATED CLOTTING TIME: 191 s
Activated Clotting Time: 235 seconds
Activated Clotting Time: 175 seconds

## 2016-12-04 LAB — CBC
HCT: 43.4 % (ref 39.0–52.0)
HEMOGLOBIN: 14.7 g/dL (ref 13.0–17.0)
MCH: 29.6 pg (ref 26.0–34.0)
MCHC: 33.9 g/dL (ref 30.0–36.0)
MCV: 87.5 fL (ref 78.0–100.0)
PLATELETS: 196 10*3/uL (ref 150–400)
RBC: 4.96 MIL/uL (ref 4.22–5.81)
RDW: 13.9 % (ref 11.5–15.5)
WBC: 8.5 10*3/uL (ref 4.0–10.5)

## 2016-12-04 LAB — POCT I-STAT, CHEM 8
BUN: 11 mg/dL (ref 6–20)
CALCIUM ION: 1.22 mmol/L (ref 1.15–1.40)
CREATININE: 0.9 mg/dL (ref 0.61–1.24)
Chloride: 102 mmol/L (ref 101–111)
GLUCOSE: 91 mg/dL (ref 65–99)
HCT: 43 % (ref 39.0–52.0)
Hemoglobin: 14.6 g/dL (ref 13.0–17.0)
Potassium: 4 mmol/L (ref 3.5–5.1)
Sodium: 139 mmol/L (ref 135–145)
TCO2: 26 mmol/L (ref 0–100)

## 2016-12-04 SURGERY — ABDOMINAL AORTOGRAM W/LOWER EXTREMITY
Anesthesia: LOCAL

## 2016-12-04 MED ORDER — HEPARIN SODIUM (PORCINE) 1000 UNIT/ML IJ SOLN
INTRAMUSCULAR | Status: DC | PRN
  Administered 2016-12-04: 7000 [IU] via INTRAVENOUS

## 2016-12-04 MED ORDER — MORPHINE SULFATE (PF) 2 MG/ML IV SOLN: 2.0000 mg | INTRAVENOUS | Status: DC | PRN

## 2016-12-04 MED ORDER — TRAZODONE HCL 100 MG PO TABS
100.0000 mg | ORAL_TABLET | Freq: Every day | ORAL | Status: DC
  Administered 2016-12-04: 100 mg via ORAL
  Filled 2016-12-04: qty 1

## 2016-12-04 MED ORDER — ASPIRIN 81 MG PO CHEW
CHEWABLE_TABLET | ORAL | Status: AC
  Administered 2016-12-04: 81 mg
  Filled 2016-12-04: qty 1

## 2016-12-04 MED ORDER — ACETAMINOPHEN 325 MG PO TABS: 650.0000 mg | ORAL_TABLET | ORAL | Status: DC | PRN

## 2016-12-04 MED ORDER — FENTANYL CITRATE (PF) 100 MCG/2ML IJ SOLN
INTRAMUSCULAR | Status: AC
  Filled 2016-12-04: qty 2

## 2016-12-04 MED ORDER — AMLODIPINE BESYLATE 5 MG PO TABS: 5.0000 mg | ORAL_TABLET | Freq: Every day | ORAL | Status: DC

## 2016-12-04 MED ORDER — MIDAZOLAM HCL 2 MG/2ML IJ SOLN
INTRAMUSCULAR | Status: AC
  Filled 2016-12-04: qty 2

## 2016-12-04 MED ORDER — SODIUM CHLORIDE 0.9 % IV SOLN: 1.0000 mL/kg/h | INTRAVENOUS | Status: AC

## 2016-12-04 MED ORDER — IODIXANOL 320 MG/ML IV SOLN
INTRAVENOUS | Status: DC | PRN
  Administered 2016-12-04: 135 mL via INTRAVENOUS

## 2016-12-04 MED ORDER — HEPARIN (PORCINE) IN NACL 2-0.9 UNIT/ML-% IJ SOLN
INTRAMUSCULAR | Status: DC | PRN
  Administered 2016-12-04: 1000 mL

## 2016-12-04 MED ORDER — OXYCODONE-ACETAMINOPHEN 5-325 MG PO TABS: 1.0000 | ORAL_TABLET | ORAL | Status: DC | PRN

## 2016-12-04 MED ORDER — ENOXAPARIN SODIUM 40 MG/0.4ML ~~LOC~~ SOLN: 40.0000 mg | SUBCUTANEOUS | Status: DC

## 2016-12-04 MED ORDER — FENTANYL CITRATE (PF) 100 MCG/2ML IJ SOLN
INTRAMUSCULAR | Status: DC | PRN
  Administered 2016-12-04: 50 ug via INTRAVENOUS

## 2016-12-04 MED ORDER — HEPARIN (PORCINE) IN NACL 2-0.9 UNIT/ML-% IJ SOLN
INTRAMUSCULAR | Status: AC
  Filled 2016-12-04: qty 1000

## 2016-12-04 MED ORDER — LISINOPRIL 40 MG PO TABS
40.0000 mg | ORAL_TABLET | Freq: Every day | ORAL | Status: DC
  Administered 2016-12-04: 40 mg via ORAL
  Filled 2016-12-04: qty 1

## 2016-12-04 MED ORDER — ONDANSETRON HCL 4 MG/2ML IJ SOLN: 4.0000 mg | Freq: Four times a day (QID) | INTRAMUSCULAR | Status: DC | PRN

## 2016-12-04 MED ORDER — PRAVASTATIN SODIUM 40 MG PO TABS
40.0000 mg | ORAL_TABLET | Freq: Every day | ORAL | Status: DC
  Administered 2016-12-04: 40 mg via ORAL
  Filled 2016-12-04: qty 1

## 2016-12-04 MED ORDER — LIDOCAINE HCL (PF) 1 % IJ SOLN
INTRAMUSCULAR | Status: AC
  Filled 2016-12-04: qty 30

## 2016-12-04 MED ORDER — INSULIN ASPART 100 UNIT/ML ~~LOC~~ SOLN
0.0000 [IU] | Freq: Three times a day (TID) | SUBCUTANEOUS | Status: DC
  Administered 2016-12-04: 3 [IU] via SUBCUTANEOUS
  Administered 2016-12-05: 2 [IU] via SUBCUTANEOUS

## 2016-12-04 MED ORDER — GABAPENTIN 300 MG PO CAPS
900.0000 mg | ORAL_CAPSULE | Freq: Three times a day (TID) | ORAL | Status: DC
  Administered 2016-12-04 (×2): 900 mg via ORAL
  Filled 2016-12-04 (×2): qty 3

## 2016-12-04 MED ORDER — INSULIN ASPART 100 UNIT/ML ~~LOC~~ SOLN: 0.0000 [IU] | Freq: Every day | SUBCUTANEOUS | Status: DC

## 2016-12-04 MED ORDER — HEPARIN SODIUM (PORCINE) 1000 UNIT/ML IJ SOLN
INTRAMUSCULAR | Status: AC
  Filled 2016-12-04: qty 1

## 2016-12-04 MED ORDER — MIDAZOLAM HCL 2 MG/2ML IJ SOLN
INTRAMUSCULAR | Status: DC | PRN
  Administered 2016-12-04: 1 mg via INTRAVENOUS

## 2016-12-04 MED ORDER — CLOPIDOGREL BISULFATE 75 MG PO TABS
75.0000 mg | ORAL_TABLET | Freq: Every day | ORAL | Status: DC
  Administered 2016-12-04: 75 mg via ORAL
  Filled 2016-12-04: qty 1

## 2016-12-04 MED ORDER — LIDOCAINE HCL (PF) 1 % IJ SOLN
INTRAMUSCULAR | Status: DC | PRN
  Administered 2016-12-04: 10 mL

## 2016-12-04 MED ORDER — SODIUM CHLORIDE 0.9 % IV SOLN
INTRAVENOUS | Status: DC
  Administered 2016-12-04: 09:00:00 via INTRAVENOUS

## 2016-12-04 MED ORDER — CARVEDILOL 3.125 MG PO TABS
3.1250 mg | ORAL_TABLET | Freq: Two times a day (BID) | ORAL | Status: DC
  Administered 2016-12-04 – 2016-12-05 (×2): 3.125 mg via ORAL
  Filled 2016-12-04 (×2): qty 1

## 2016-12-04 SURGICAL SUPPLY — 13 items
BALLN MUSTANG 4X20X135 (BALLOONS) ×3
BALLOON MUSTANG 4X20X135 (BALLOONS) IMPLANT
CATH OMNI FLUSH 5F 65CM (CATHETERS) ×1 IMPLANT
GUIDEWIRE ANGLED .035X260CM (WIRE) ×1 IMPLANT
KIT ENCORE 26 ADVANTAGE (KITS) ×1 IMPLANT
KIT MICROINTRODUCER STIFF 5F (SHEATH) ×1 IMPLANT
KIT PV (KITS) ×3 IMPLANT
SHEATH FLEX ANSEL ANG 5F 45CM (SHEATH) ×1 IMPLANT
SHEATH PINNACLE 5F 10CM (SHEATH) ×1 IMPLANT
SYR MEDRAD MARK V 150ML (SYRINGE) ×3 IMPLANT
TRANSDUCER W/STOPCOCK (MISCELLANEOUS) ×3 IMPLANT
TRAY PV CATH (CUSTOM PROCEDURE TRAY) ×3 IMPLANT
WIRE BENTSON .035X145CM (WIRE) ×1 IMPLANT

## 2016-12-04 NOTE — Op Note (Signed)
    Patient name: Clinton HammockReginald E Barse MRN: [id number] DOB: [birth year] Sex: male  12/04/2016 Pre-operative Diagnosis: stenosis of left femoral to popliteal vein bypass graft Post-operative diagnosis:  Same Surgeon:  Luanna SalkBrandon C. Randie Heinzain, MD Procedure Performed: 1.  US guided cannulation of right common femoral artery 2.  Aortogram with bilateral lower extremity runoff 3.  Selective cannulation of bypass graft on left 4.  pta of bypass graft stenosis with 4mm balloon  Indications:  65 year old male history of critical limb ischemia on the left with wounds that are now healed following left common femoral to below-knee popliteal artery bypass. On duplex surveillance he was noted to have a stenosis in the distal graft. He was therefore indicated for the above procedure.  Findings: Aorta was patent with possible stenosis of the right external iliac artery. On the left side there is a patent SFA as well as patent bypass graft that runoff to the below-knee popliteal artery. After crossing lesion in the bypass graft the wire was stenotic with a presumed greater than 90% stenosis in the bypass. Following interventionrunoff through the bypass graft runoff via the AT and peroneal to the level of the ankle. There is also a notable persistent AV fistula on the left that is stable from previous.   Procedure:  The patient was identified in the holding area and taken to room 8.  The patient was then placed supine on the table and prepped and draped in the usual sterile fashion.  A time out was called.  Ultrasound was used to evaluate the right common femoral artery.  It was patent .  A digital ultrasound image was acquired.  A micropuncture needle was used to access the right common femoral artery under ultrasound guidance.  An 018 wire was advanced without resistance and a micropuncture sheath was placed.  The 018 wire was removed and a benson wire was placed.  The micropuncture sheath was exchanged for a 5 french  sheath.  An omniflush catheter was advanced over the wire to the level of L-1.  An abdominal angiogram was obtained followed by bilateral runoff.  Next, using the omniflush catheter and a benson wire, the aortic bifurcation was crossed.patient was then heparinized and a long 5 French sheath was placed. Using Glidewire alone we selected the bypass grafting cross the lesion. Angiogram of the left lower extremity demonstrated we were in the bypass. We then performed balloon angioplasty of the stenosis. Completion demonstrated brisk runoff via the graft and the SFA with runoff via the 18 peroneal to the level of the ankle. Satisfied wire were removed and we exchanged for a short 5 [place]French sheath. An ACT was checked patient will have sheath removed and holding and will be admitted for overnight observation.  I was present and administered 44 minutes moderate sedation with fentanyl and Versed.  Contrast: 135 mL.  Radiation: 4.3 min    [name] C. Randie Heinzain, MD [facility] [place] Office: [phone] Pager: [phone]

## 2016-12-04 NOTE — Progress Notes (Signed)
Site area: rt groin Site Prior to Removal:  Level 0 Pressure Applied For: 20 minutes Manual:   yes Patient Status During Pull: stable   Post Pull Site:  Level 0 Post Pull Instructions Given:  yes Post Pull Pulses Present: yes Dressing Applied:  Gauze/tegaderm Bedrest begins @ 1250 Comments:

## 2016-12-04 NOTE — H&P (Signed)
History of Present Illness Clinton Harrington is a 65 y.o. male patient of Dr. Hart RochesterLawson who is s/p left femoral popliteal bpg using saphenous vein graft on 01/01/2016 for nonhealing ulcers left foot.  He returns today for routine follow up. Pt denies any claudication sx's with walking since the January 2017 left leg bpg. The wounds on his left foot have healed and he was released from the wound care center on 05/20/16. He walks all day, at least 1 hour daily. He denies any hx of stroke or TIA. He reports an MI in 1999, no subsequent MI.  He denies chest pain, denies dyspnea, denies feeling light headed.   He stratined his back recently trying to lift his tractor, is seeing a Landchiropractor.  0.8 creatinine in January 2017 (review of records).  Pt Diabetic: Yes, states his last A1C was 5.7, improved from 7.7 Pt smoker: smoker (1/2 ppd, started at age 65 yrs), states his mother has dementia, is stressed by this, lives with her  Pt meds include: Statin :Yes Betablocker: Yes ASA: No, is allergic Other anticoagulants/antiplatelets: Plavix       Past Medical History:  Diagnosis Date  . Arthritis   . Diabetes (HCC)   . Diabetes mellitus Jan. 1999  . High cholesterol   . Hypertension   . Myocardial infarct   . Peripheral vascular disease (HCC)   . Seasonal allergies   . Urine incontinence     Social History       Social History  Substance Use Topics  . Smoking status: Current Every Day Smoker    Packs/day: 0.50    Years: 45.00    Types: Cigarettes  . Smokeless tobacco: Never Used     Comment: 1/2 pk per day  . Alcohol use No    Family History      Family History  Problem Relation Age of Onset  . Arthritis Mother   . Heart disease Maternal Grandmother   . Hypertension Mother   . Mental illness Maternal Grandmother          Past Surgical History:  Procedure Laterality Date  . CORONARY STENT PLACEMENT  Jan.  1999  .  FEMORAL-POPLITEAL BYPASS GRAFT Left 01/03/2016   Procedure: LEFT COMMON FEMORAL-BELOW KNEE POPLITEAL ARTERY BYPASS GRAFT USING NON REVERSED TRANSLOCATED SAPHENOUS VEIN GRAFT;  Surgeon: Pryor OchoaJames D Lawson, MD;  [facility];  Service: Vascular;  Laterality: Left;  . INTRAOPERATIVE ARTERIOGRAM Left 01/03/2016   Procedure: INTRA OPERATIVE ARTERIOGRAM LEFT LEG;  Surgeon: Pryor OchoaJames D Lawson, MD;  [facility];  Service: Vascular;  Laterality: Left;  . PERIPHERAL VASCULAR CATHETERIZATION N/A 01/01/2016   Procedure: Abdominal Aortogram w/Lower Extremity;  Surgeon: Fransisco HertzBrian L Chen, MD;  [facility];  Service: Cardiovascular;  Laterality: N/A;  . PERIPHERAL VASCULAR CATHETERIZATION Left 01/01/2016   Procedure: Peripheral Vascular Balloon Angioplasty;  Surgeon: Fransisco HertzBrian L Chen, MD;  [facility];  Service: Cardiovascular;  Laterality: Left;  Popliteal artery         Allergies  Allergen Reactions  . Aspirin Other (See Comments)    Stomach upset  . Oxycodone-Acetaminophen Itching  . Latex Rash          Current Outpatient Prescriptions  Medication Sig Dispense Refill  . acetaminophen (TYLENOL) 650 MG CR tablet Take 1,300 mg by mouth 2 (two) times daily.    . carvedilol (COREG) 3.125 MG tablet Take 1 tablet (3.125 mg total) by mouth 2 (two) times daily  with a meal. 60 tablet 3  . collagenase (SANTYL) ointment Apply 1 application topically daily. 15 g 0  . gabapentin (NEURONTIN) 300 MG capsule Take 900 mg by mouth 2 (two) times daily.   0  . glimepiride (AMARYL) 4 MG tablet Take 4 mg by mouth 2 (two) times daily.    Marland Kitchen glucose blood (FREESTYLE LITE) test strip Use as instructed 100 each 12  . glucose blood test strip Use as instructed 100 each 12  . Lancets (FREESTYLE) lancets Use as instructed 100 each 12  . lisinopril (PRINIVIL,ZESTRIL) 40 MG tablet Take 1 tablet (40 mg total) by mouth daily. 90 tablet 3  . metFORMIN (GLUCOPHAGE) 1000 MG tablet Take 1 tablet  (1,000 mg total) by mouth 2 (two) times daily with a meal. 180 tablet 3  . nortriptyline (PAMELOR) 10 MG capsule Take nortriptyline 10mg  at bedtime for 2 week, then increase to 2 tablet at bedtime 60 capsule 5  . pravastatin (PRAVACHOL) 40 MG tablet Take 1 tablet (40 mg total) by mouth daily. 90 tablet 3  . PRESCRIPTION MEDICATION Inhale into the lungs at bedtime. CPAP    . traMADol (ULTRAM) 50 MG tablet Take 1-2 tablets by mouth every 6 hours as needed for pain. 30 tablet 0  . traMADol (ULTRAM) 50 MG tablet Take by mouth every 6 (six) hours as needed.    . traZODone (DESYREL) 100 MG tablet   5   No current facility-administered medications for this visit.     ROS: See HPI for pertinent positives and negatives.   Physical Examination      Vitals:   11/28/16 1104 11/28/16 1106  BP: (!) 171/96 (!) 155/96  Pulse: 82   Resp: 16   Temp: 97.3 F (36.3 C)   TempSrc: Oral   SpO2: 99%   Weight: 200 lb (90.7 kg)   Height: 5\' 7"  (1.702 m)    Body mass index is 31.32 kg/m.  General: A&O x 3, WDWN, obese male. Gait: normal Eyes: PERRLA. Pulmonary: CTAB, without wheezes , rales or rhonchi. Cardiac: regular rhythm and rate, no detected murmur.    Carotid Bruits Right Left   Negative Negative  Aorta is not palpable. Radial pulses: are 2+ palpable   VASCULAR EXAM: Extremitiesno ischemic changes, no Gangrene; no open wounds. Left foot with 1+ pitting and non pitting edema. The wounds on his left foot have healed.  LE Pulses Right Left  FEMORAL 1+ palpable 2+ palpable  POPLITEAL not palpable not palpable  POSTERIOR TIBIAL not palpable not palpable  DORSALIS PEDIS ANTERIOR TIBIAL not palpable not palpable   Abdomen: soft, NT, no palpable masses. Skin: no rashes, no ulcers. Musculoskeletal: no muscle wasting or atrophy. Neurologic: A&O X 3; Appropriate Affect ; SENSATION: normal;  MOTOR FUNCTION: moving all extremities equally, motor strength 5/5 throughout. Speech is fluent/normal. CN 2-12 intact.    ASSESSMENT: Clinton Harrington is a 65 y.o. male who is s/p left femoral to below-knee popliteal bypass with saphenous vein on 01/03/2016.  Pt denies any claudication sx's with walking since the January 2017 left leg bpg. The wounds on his left foot have healed and he was released from the wound care center on 05/20/16. He walks all day.  He has no signs of ischemia in his feet/legs.  His atherosclerotic risk factors include continued smoking and controlledDM.   At pt's 05/23/16 visit, I discussed with Dr. Imogene Burn the pulsatile, turbulent left popliteal vein flow which appears c/w a possible arteriovenous communication. This is  not a worrisome finding, will monitor in subsequent arterial duplex of left LE.   Bilateral ABI's improved slightly, but distal graft stenosis has worsened, see DATA.     DATA  Left LE arterial duplex today demonstrates greater than 70% distal graft stenosis (451 cm/s today, compared to 152 cm/s on 08-28-16), difficult to visualize due to depth of graft.  Triphasic waveforms in the proximal and mid graft, stenotic at the distal graft, monophasic at the distal anastomosis  ABI's: Right: 0.94 (0.91, 08-28-16), bi and triphasic waveforms, TBI: 0.71 (normal) (0.58) Left: 0.85 (0.78), biphasic waveforms, TBI: 0.51 (0.55)  PLAN: Angiogram with possible intervention of left distal graft bypass. Will have to be admitted for observation post op as he does not have a ride.   Tahirih Lair C. Randie Heinzain, MD [facility] [place] Office: [phone] Pager: [phone]

## 2016-12-04 NOTE — Progress Notes (Signed)
Hypoglycemic Event  CBG: 60  Treatment: 15 GM carbohydrate snack x2  Symptoms: None  Follow-up CBG: Time: 1145 CBG Result:58  Treatment: 15 GM carbohydrate snack given at 1200  Symptoms: None  Follow-up CBG: Time: 1215 CBG Result:108  Possible Reasons for Event: Other: Pt NPO for procedure  Comments/MD notified:      Janit PaganAllison C Clydia Nieves, RN, BSN, RCIS

## 2016-12-05 ENCOUNTER — Other Ambulatory Visit: Payer: Self-pay

## 2016-12-05 DIAGNOSIS — Z9862 Peripheral vascular angioplasty status: Secondary | ICD-10-CM

## 2016-12-05 DIAGNOSIS — T82858A Stenosis of vascular prosthetic devices, implants and grafts, initial encounter: Secondary | ICD-10-CM

## 2016-12-05 DIAGNOSIS — I739 Peripheral vascular disease, unspecified: Secondary | ICD-10-CM

## 2016-12-05 DIAGNOSIS — I70302 Unspecified atherosclerosis of unspecified type of bypass graft(s) of the extremities, left leg: Secondary | ICD-10-CM | POA: Diagnosis not present

## 2016-12-05 LAB — GLUCOSE, CAPILLARY: Glucose-Capillary: 141 mg/dL — ABNORMAL HIGH (ref 65–99)

## 2016-12-05 MED ORDER — METFORMIN HCL 1000 MG PO TABS: 1000.0000 mg | ORAL_TABLET | Freq: Two times a day (BID) | ORAL | 3 refills | Status: DC

## 2016-12-05 MED ORDER — TRAMADOL HCL 50 MG PO TABS: 50.0000 mg | ORAL_TABLET | Freq: Four times a day (QID) | ORAL | Status: DC | PRN

## 2016-12-05 NOTE — Progress Notes (Signed)
CSW was consulted by patient nurse. Nurse stated that patient is due for D/C today and lives at home with elderly mother. Nurse stated that patient feels like he is unable to take care of mother any longer and wants resources.  Clinical Social Worker met patient at bedside to offer support and discuss patients needs at discharge. Patient stated that he is the only one taking care of his mother and "its getting to much". Patient stated that his mother has become incontinent and he is unable to handle it. CSW talked to patient about the possibility of Long Term care for patients mother. CSW gave patient a list of facility in the area and encouraged him to do research on facility if he choices to pursue it. Patient thanked CSW and stated he had no other needs.   Rhea Pink, MSW,  [place]

## 2016-12-05 NOTE — Progress Notes (Signed)
Patient discharged to home. IV removed. Telemetry d/c, SW gave patient support regarding caring for his mother. Patient drove himself and is driving home, no pain medications given today. AVS and discharge instructions went over. No questions at this time. Volunteers took patient to valet to get car.  Minerva Endsiffany N Gerry Heaphy RN

## 2016-12-05 NOTE — Progress Notes (Signed)
12/04/2016 1315 Received pt to room 2W06 from cath lab.  S/P angioplasty to L  fem-pop graft, R groin site is Level 0.  Reminded pt. Bedrest until 6pm.  Pt A&O, no c/o pain.  Tele monitor applied and CCMD notified.  Oriented to room, call light and bed.  Call bell in reach. Kathryne HitchAllen, Waino Mounsey C

## 2016-12-05 NOTE — Progress Notes (Signed)
Patient's right femoral site Level 1 at 0000. See flow sheets for documentation. Small hematoma noted, site tender to touch. Rechecked site at 0315, no hematoma noted but dry blood on dressing. No active bleeding. Still level 1. Pulses intact and extremities warm and dry. Will continue to monitor

## 2016-12-05 NOTE — Progress Notes (Signed)
  Progress Note    12/05/2016 8:27 AM 1 Day Post-Op  Subjective:  No complaints this a.m.  Vitals:   12/04/16 2034 12/05/16 0531  BP: 135/71 134/76  Pulse: 81 78  Resp: 18 18  Temp: 98.9 F (37.2 C) 98.2 F (36.8 C)    Physical Exam: aaox3 Right groin is soft Palpable left AT CBC    Component Value Date/Time   WBC 8.5 12/04/2016 1519   RBC 4.96 12/04/2016 1519   HGB 14.7 12/04/2016 1519   HCT 43.4 12/04/2016 1519   PLT 196 12/04/2016 1519   MCV 87.5 12/04/2016 1519   MCH 29.6 12/04/2016 1519   MCHC 33.9 12/04/2016 1519   RDW 13.9 12/04/2016 1519   LYMPHSABS 2.2 08/07/2012 1802   MONOABS 0.5 08/07/2012 1802   EOSABS 0.2 08/07/2012 1802   BASOSABS 0.1 08/07/2012 1802    BMET    Component Value Date/Time   NA 139 12/04/2016 0906   K 4.0 12/04/2016 0906   CL 102 12/04/2016 0906   CO2 27 01/05/2016 0248   GLUCOSE 91 12/04/2016 0906   BUN 11 12/04/2016 0906   CREATININE 0.83 12/04/2016 1519   CALCIUM 8.6 (L) 01/05/2016 0248   GFRNONAA >60 12/04/2016 1519   GFRAA >60 12/04/2016 1519    INR No results found for: INR   Intake/Output Summary (Last 24 hours) at 12/05/16 0827 Last data filed at 12/04/16 2053  Gross per 24 hour  Intake             1080 ml  Output                0 ml  Net             1080 ml     Assessment:  65 y.o. male is s/p pta of left fem-pop bpg  Plan: Discharge today, f/u in 3 months with duplex and abi   Hallel Denherder C. Randie Heinzain, MD [facility] [place] Office: [phone] Pager: [phone]  12/05/2016 8:27 AM

## 2017-01-20 DIAGNOSIS — E1142 Type 2 diabetes mellitus with diabetic polyneuropathy: Secondary | ICD-10-CM | POA: Diagnosis not present

## 2017-01-20 DIAGNOSIS — G4700 Insomnia, unspecified: Secondary | ICD-10-CM | POA: Diagnosis not present

## 2017-01-20 DIAGNOSIS — F17200 Nicotine dependence, unspecified, uncomplicated: Secondary | ICD-10-CM | POA: Diagnosis not present

## 2017-01-20 DIAGNOSIS — F439 Reaction to severe stress, unspecified: Secondary | ICD-10-CM | POA: Diagnosis not present

## 2017-01-20 DIAGNOSIS — G629 Polyneuropathy, unspecified: Secondary | ICD-10-CM | POA: Diagnosis not present

## 2017-01-20 DIAGNOSIS — Z7984 Long term (current) use of oral hypoglycemic drugs: Secondary | ICD-10-CM | POA: Diagnosis not present

## 2017-01-20 DIAGNOSIS — G4730 Sleep apnea, unspecified: Secondary | ICD-10-CM | POA: Diagnosis not present

## 2017-01-20 DIAGNOSIS — E782 Mixed hyperlipidemia: Secondary | ICD-10-CM | POA: Diagnosis not present

## 2017-01-20 DIAGNOSIS — I739 Peripheral vascular disease, unspecified: Secondary | ICD-10-CM | POA: Diagnosis not present

## 2017-01-20 DIAGNOSIS — I10 Essential (primary) hypertension: Secondary | ICD-10-CM | POA: Diagnosis not present

## 2017-02-11 DIAGNOSIS — J0100 Acute maxillary sinusitis, unspecified: Secondary | ICD-10-CM | POA: Diagnosis not present

## 2017-02-19 ENCOUNTER — Encounter (HOSPITAL_COMMUNITY): Payer: Self-pay | Admitting: *Deleted

## 2017-03-04 ENCOUNTER — Encounter: Payer: Self-pay | Admitting: Vascular Surgery

## 2017-03-14 ENCOUNTER — Encounter: Payer: Self-pay | Admitting: Vascular Surgery

## 2017-03-14 ENCOUNTER — Ambulatory Visit (HOSPITAL_COMMUNITY)
Admission: RE | Admit: 2017-03-14 | Discharge: 2017-03-14 | Disposition: A | Discharge status: Home / Self Care | Payer: PPO | Source: Ambulatory Visit | Attending: Vascular Surgery | Admitting: Vascular Surgery

## 2017-03-14 ENCOUNTER — Ambulatory Visit (INDEPENDENT_AMBULATORY_CARE_PROVIDER_SITE_OTHER): Payer: PPO | Admitting: Vascular Surgery

## 2017-03-14 VITALS — BP 108/87 | HR 67 | Temp 97.4°F | Resp 20 | Ht 67.0 in | Wt 200.0 lb

## 2017-03-14 DIAGNOSIS — Z95828 Presence of other vascular implants and grafts: Secondary | ICD-10-CM

## 2017-03-14 DIAGNOSIS — I10 Essential (primary) hypertension: Secondary | ICD-10-CM | POA: Diagnosis not present

## 2017-03-14 DIAGNOSIS — E1151 Type 2 diabetes mellitus with diabetic peripheral angiopathy without gangrene: Secondary | ICD-10-CM | POA: Insufficient documentation

## 2017-03-14 DIAGNOSIS — T82858A Stenosis of vascular prosthetic devices, implants and grafts, initial encounter: Secondary | ICD-10-CM | POA: Insufficient documentation

## 2017-03-14 DIAGNOSIS — Y838 Other surgical procedures as the cause of abnormal reaction of the patient, or of later complication, without mention of misadventure at the time of the procedure: Secondary | ICD-10-CM | POA: Insufficient documentation

## 2017-03-14 DIAGNOSIS — I739 Peripheral vascular disease, unspecified: Secondary | ICD-10-CM | POA: Diagnosis not present

## 2017-03-14 DIAGNOSIS — R938 Abnormal findings on diagnostic imaging of other specified body structures: Secondary | ICD-10-CM | POA: Diagnosis not present

## 2017-03-14 DIAGNOSIS — I779 Disorder of arteries and arterioles, unspecified: Secondary | ICD-10-CM

## 2017-03-14 DIAGNOSIS — Z9862 Peripheral vascular angioplasty status: Secondary | ICD-10-CM | POA: Insufficient documentation

## 2017-03-14 DIAGNOSIS — Z720 Tobacco use: Secondary | ICD-10-CM | POA: Diagnosis not present

## 2017-03-14 NOTE — Progress Notes (Signed)
Patient ID: Clinton Harrington, male   DOB: Oct 11, 1951, 66 y.o.   MRN: [id number]  Reason for Consult: Re-evaluation (3 mth F/u with LLE bypass duplex and ABI's, S/p PTA LLE bypass stenosis 12/04/16)   Referred by Johny Blamer, MD  Subjective:     HPI:  Clinton Harrington is a 66 y.o. male  Here for evaluation of previous balloon angioplasty of his femoral-popliteal artery bypass graft that had a distal anastomotic stricture. At that time he had 90% stenosis with velocity of 400. The bypass was initially placed for wound of his left toe which is subsequent follow runoff and he has no further wounds rest pain or claudication. He does take Plavix and statin drug. He continues to smoke. No complaints related to today's visit.  Past Medical History:  Diagnosis Date  . Arthritis   . Diabetes (HCC)   . Diabetes mellitus Jan. 1999  . High cholesterol   . Hypertension   . Myocardial infarct   . Peripheral vascular disease (HCC)   . Seasonal allergies   . Urine incontinence    Family History  Problem Relation Age of Onset  . Arthritis Mother   . Hypertension Mother   . Heart disease Maternal Grandmother   . Mental illness Maternal Grandmother    Past Surgical History:  Procedure Laterality Date  . CORONARY STENT PLACEMENT  Jan.  1999  . FEMORAL-POPLITEAL BYPASS GRAFT Left 01/03/2016   Procedure: LEFT COMMON FEMORAL-BELOW KNEE POPLITEAL ARTERY BYPASS GRAFT USING NON REVERSED TRANSLOCATED SAPHENOUS VEIN GRAFT;  Surgeon: Pryor Ochoa, MD;  Location: [facility] OR;  Service: Vascular;  Laterality: Left;  . INTRAOPERATIVE ARTERIOGRAM Left 01/03/2016   Procedure: INTRA OPERATIVE ARTERIOGRAM LEFT LEG;  Surgeon: Pryor Ochoa, MD;  [facility];  Service: Vascular;  Laterality: Left;  . PERIPHERAL VASCULAR CATHETERIZATION N/A 01/01/2016   Procedure: Abdominal Aortogram w/Lower Extremity;  Surgeon: Fransisco Hertz, MD;  Location: [facility] INV[facility] CV LAB;  Service: Cardiovascular;  Laterality: N/A;  .  PERIPHERAL VASCULAR CATHETERIZATION Left 01/01/2016   Procedure: Peripheral Vascular Balloon Angioplasty;  Surgeon: Fransisco Hertz, MD;  Location: [facility] INVAS[facility] CV LAB;  Service: Cardiovascular;  Laterality: Left;  Popliteal artery  . PERIPHERAL VASCULAR CATHETERIZATION N/A 12/04/2016   Procedure: Abdominal Aortogram w/Lower Extremity;  Surgeon: Maeola Harman, MD;  Location: [facility] INVASIVE CV LAB;  Service: Cardiovascular;  Laterality: N/A;  . PERIPHERAL VASCULAR CATHETERIZATION Left 12/04/2016   Procedure: Peripheral Vascular Balloon Angioplasty;  Surgeon: Maeola Harman, MD;  Location: [facility] INVAS[facility] CV LAB;  Service: Cardiovascular;  Laterality: Left;  Fem_Pop bypass    Short Social History:  Social History  Substance Use Topics  . Smoking status: Current Every Day Smoker    Packs/day: 0.50    Years: 45.00    Types: Cigarettes  . Smokeless tobacco: Never Used     Comment: 1/2 pk per day  . Alcohol use No    Allergies  Allergen Reactions  . Aspirin Other (See Comments)    Stomach upset  . Latex Rash  . Oxycodone-Acetaminophen Itching    Current Outpatient Prescriptions  Medication Sig Dispense Refill  . acetaminophen (TYLENOL) 650 MG CR tablet Take 1,300 mg by mouth 2 (two) times daily.    Marland Kitchen amLODipine (NORVASC) 5 MG tablet Take 5 mg by mouth daily.  4  . carvedilol (COREG) 3.125 MG tablet Take 1 tablet (3.125 mg total) by mouth 2 (two) times daily with a meal. 60 tablet 3  . clopidogrel (  PLAVIX) 75 MG tablet Take 75 mg by mouth daily.  1  . gabapentin (NEURONTIN) 300 MG capsule Take 900 mg by mouth 3 (three) times daily.   0  . glimepiride (AMARYL) 4 MG tablet Take 4 mg by mouth 2 (two) times daily.    Marland Kitchen lisinopril (PRINIVIL,ZESTRIL) 40 MG tablet Take 1 tablet (40 mg total) by mouth daily. 90 tablet 3  . metFORMIN (GLUCOPHAGE) 1000 MG tablet Take 1 tablet (1,000 mg total) by mouth 2 (two) times daily with a meal. 180 tablet 3  . pravastatin (PRAVACHOL) 40 MG tablet  Take 1 tablet (40 mg total) by mouth daily. (Patient not taking: Reported on 12/04/2016) 90 tablet 3  . traMADol (ULTRAM) 50 MG tablet Take 1 tablet (50 mg total) by mouth every 6 (six) hours as needed for moderate pain.    . traZODone (DESYREL) 100 MG tablet Take 100 mg by mouth at bedtime.     No current facility-administered medications for this visit.     Review of Systems  Constitutional:  Constitutional negative. HENT: HENT negative.  Eyes: Eyes negative.  Respiratory: Respiratory negative.  GI: Gastrointestinal negative.  Musculoskeletal: Musculoskeletal negative.  Skin: Skin negative.  Neurological: Neurological negative. Hematologic: Hematologic/lymphatic negative.  Psychiatric: Psychiatric negative.        Objective:  Objective   There were no vitals filed for this visit. There is no height or weight on file to calculate BMI.  Physical Exam  Constitutional: He is oriented to person, place, and time. He appears well-developed.  HENT:  Head: Normocephalic.  Neck: Normal range of motion.  Cardiovascular: Normal rate.   Pulses:      Femoral pulses are 2+ on the right side, and 2+ on the left side. Strong left dp signal  Pulmonary/Chest: Effort normal.  Abdominal: Soft. He exhibits no mass.  Musculoskeletal: Normal range of motion. He exhibits no edema.  Neurological: He is alert and oriented to person, place, and time.  Skin: Skin is warm and dry.  Psychiatric: His behavior is normal. Judgment and thought content normal.    Data: I independently interpreted his left lower extremity bypass duplex which did demonstrate an elevated velocity to 192 in the proximal bypass graft to the ultrasonographer suggested could be from a twist or bend in the graft. Distally the velocities are normal and the previous elevated velocity to 400 has resolved. ABI on the left is 0.87.     Assessment/Plan:     66 year old with PDS left femoral-to-popliteal artery bypass graft by Dr.  Joycelyn Schmid performed for wound of the left toe which has now resolved. His bypass graft had a tight stenosis that was ballooned back in December and now that has resolved and the velocities other than one elevation proximally are normal throughout the graft and ABI 0.87 with TBI 0.7. On our angiogram before we demonstrated he had patency of his SFA still. He has no symptoms at this time and the velocities do not suggest impending graft failure. With that he can follow-up in 6 months with repeat ABIs and duplex. Counseled on smoking cessation. Patientwas good understanding.    Maeola Harman MD [facility]

## 2017-03-17 NOTE — Addendum Note (Signed)
Addended by: Burton Apley A on: 03/17/2017 09:14 AM   Modules accepted: Orders

## 2017-03-19 ENCOUNTER — Ambulatory Visit (INDEPENDENT_AMBULATORY_CARE_PROVIDER_SITE_OTHER): Payer: PPO | Admitting: Ophthalmology

## 2017-03-19 DIAGNOSIS — H2513 Age-related nuclear cataract, bilateral: Secondary | ICD-10-CM | POA: Diagnosis not present

## 2017-03-19 DIAGNOSIS — E113292 Type 2 diabetes mellitus with mild nonproliferative diabetic retinopathy without macular edema, left eye: Secondary | ICD-10-CM | POA: Diagnosis not present

## 2017-03-19 DIAGNOSIS — H43813 Vitreous degeneration, bilateral: Secondary | ICD-10-CM | POA: Diagnosis not present

## 2017-03-19 DIAGNOSIS — H35033 Hypertensive retinopathy, bilateral: Secondary | ICD-10-CM | POA: Diagnosis not present

## 2017-03-19 DIAGNOSIS — E113391 Type 2 diabetes mellitus with moderate nonproliferative diabetic retinopathy without macular edema, right eye: Secondary | ICD-10-CM | POA: Diagnosis not present

## 2017-03-19 DIAGNOSIS — I10 Essential (primary) hypertension: Secondary | ICD-10-CM | POA: Diagnosis not present

## 2017-03-19 DIAGNOSIS — H353121 Nonexudative age-related macular degeneration, left eye, early dry stage: Secondary | ICD-10-CM | POA: Diagnosis not present

## 2017-03-19 DIAGNOSIS — E11319 Type 2 diabetes mellitus with unspecified diabetic retinopathy without macular edema: Secondary | ICD-10-CM | POA: Diagnosis not present

## 2017-03-25 ENCOUNTER — Other Ambulatory Visit: Payer: Self-pay

## 2017-03-25 MED ORDER — CLOPIDOGREL BISULFATE 75 MG PO TABS: 75.0000 mg | ORAL_TABLET | Freq: Every day | ORAL | 1 refills | Status: DC

## 2017-04-02 DIAGNOSIS — G4733 Obstructive sleep apnea (adult) (pediatric): Secondary | ICD-10-CM | POA: Diagnosis not present

## 2017-04-16 ENCOUNTER — Encounter (INDEPENDENT_AMBULATORY_CARE_PROVIDER_SITE_OTHER): Payer: PPO | Admitting: Ophthalmology

## 2017-04-16 DIAGNOSIS — I10 Essential (primary) hypertension: Secondary | ICD-10-CM | POA: Diagnosis not present

## 2017-04-16 DIAGNOSIS — E11311 Type 2 diabetes mellitus with unspecified diabetic retinopathy with macular edema: Secondary | ICD-10-CM

## 2017-04-16 DIAGNOSIS — E113311 Type 2 diabetes mellitus with moderate nonproliferative diabetic retinopathy with macular edema, right eye: Secondary | ICD-10-CM | POA: Diagnosis not present

## 2017-04-16 DIAGNOSIS — H43813 Vitreous degeneration, bilateral: Secondary | ICD-10-CM

## 2017-04-16 DIAGNOSIS — E113292 Type 2 diabetes mellitus with mild nonproliferative diabetic retinopathy without macular edema, left eye: Secondary | ICD-10-CM | POA: Diagnosis not present

## 2017-04-16 DIAGNOSIS — H35033 Hypertensive retinopathy, bilateral: Secondary | ICD-10-CM | POA: Diagnosis not present

## 2017-05-14 ENCOUNTER — Encounter (INDEPENDENT_AMBULATORY_CARE_PROVIDER_SITE_OTHER): Payer: PPO | Admitting: Ophthalmology

## 2017-05-14 DIAGNOSIS — E113311 Type 2 diabetes mellitus with moderate nonproliferative diabetic retinopathy with macular edema, right eye: Secondary | ICD-10-CM | POA: Diagnosis not present

## 2017-05-14 DIAGNOSIS — E11311 Type 2 diabetes mellitus with unspecified diabetic retinopathy with macular edema: Secondary | ICD-10-CM

## 2017-05-14 DIAGNOSIS — H43813 Vitreous degeneration, bilateral: Secondary | ICD-10-CM | POA: Diagnosis not present

## 2017-05-14 DIAGNOSIS — H35033 Hypertensive retinopathy, bilateral: Secondary | ICD-10-CM

## 2017-05-14 DIAGNOSIS — E113292 Type 2 diabetes mellitus with mild nonproliferative diabetic retinopathy without macular edema, left eye: Secondary | ICD-10-CM

## 2017-05-14 DIAGNOSIS — I10 Essential (primary) hypertension: Secondary | ICD-10-CM | POA: Diagnosis not present

## 2017-06-10 ENCOUNTER — Encounter (INDEPENDENT_AMBULATORY_CARE_PROVIDER_SITE_OTHER): Payer: PPO | Admitting: Ophthalmology

## 2017-06-10 DIAGNOSIS — E113311 Type 2 diabetes mellitus with moderate nonproliferative diabetic retinopathy with macular edema, right eye: Secondary | ICD-10-CM | POA: Diagnosis not present

## 2017-06-10 DIAGNOSIS — E11311 Type 2 diabetes mellitus with unspecified diabetic retinopathy with macular edema: Secondary | ICD-10-CM | POA: Diagnosis not present

## 2017-06-10 DIAGNOSIS — E113292 Type 2 diabetes mellitus with mild nonproliferative diabetic retinopathy without macular edema, left eye: Secondary | ICD-10-CM | POA: Diagnosis not present

## 2017-06-10 DIAGNOSIS — I10 Essential (primary) hypertension: Secondary | ICD-10-CM | POA: Diagnosis not present

## 2017-06-10 DIAGNOSIS — H43813 Vitreous degeneration, bilateral: Secondary | ICD-10-CM

## 2017-06-10 DIAGNOSIS — H35033 Hypertensive retinopathy, bilateral: Secondary | ICD-10-CM | POA: Diagnosis not present

## 2017-07-21 DIAGNOSIS — G629 Polyneuropathy, unspecified: Secondary | ICD-10-CM | POA: Diagnosis not present

## 2017-07-21 DIAGNOSIS — F17200 Nicotine dependence, unspecified, uncomplicated: Secondary | ICD-10-CM | POA: Diagnosis not present

## 2017-07-21 DIAGNOSIS — I10 Essential (primary) hypertension: Secondary | ICD-10-CM | POA: Diagnosis not present

## 2017-07-21 DIAGNOSIS — G4700 Insomnia, unspecified: Secondary | ICD-10-CM | POA: Diagnosis not present

## 2017-07-21 DIAGNOSIS — I739 Peripheral vascular disease, unspecified: Secondary | ICD-10-CM | POA: Diagnosis not present

## 2017-07-21 DIAGNOSIS — F439 Reaction to severe stress, unspecified: Secondary | ICD-10-CM | POA: Diagnosis not present

## 2017-07-21 DIAGNOSIS — G4730 Sleep apnea, unspecified: Secondary | ICD-10-CM | POA: Diagnosis not present

## 2017-07-21 DIAGNOSIS — E782 Mixed hyperlipidemia: Secondary | ICD-10-CM | POA: Diagnosis not present

## 2017-07-21 DIAGNOSIS — E1142 Type 2 diabetes mellitus with diabetic polyneuropathy: Secondary | ICD-10-CM | POA: Diagnosis not present

## 2017-07-23 DIAGNOSIS — R269 Unspecified abnormalities of gait and mobility: Secondary | ICD-10-CM | POA: Diagnosis not present

## 2017-07-23 DIAGNOSIS — G4733 Obstructive sleep apnea (adult) (pediatric): Secondary | ICD-10-CM | POA: Diagnosis not present

## 2017-07-30 ENCOUNTER — Encounter (INDEPENDENT_AMBULATORY_CARE_PROVIDER_SITE_OTHER): Payer: PPO | Admitting: Ophthalmology

## 2017-07-30 DIAGNOSIS — E113311 Type 2 diabetes mellitus with moderate nonproliferative diabetic retinopathy with macular edema, right eye: Secondary | ICD-10-CM | POA: Diagnosis not present

## 2017-07-30 DIAGNOSIS — H35033 Hypertensive retinopathy, bilateral: Secondary | ICD-10-CM

## 2017-07-30 DIAGNOSIS — E11311 Type 2 diabetes mellitus with unspecified diabetic retinopathy with macular edema: Secondary | ICD-10-CM

## 2017-07-30 DIAGNOSIS — I10 Essential (primary) hypertension: Secondary | ICD-10-CM

## 2017-07-30 DIAGNOSIS — H43813 Vitreous degeneration, bilateral: Secondary | ICD-10-CM

## 2017-07-30 DIAGNOSIS — H2513 Age-related nuclear cataract, bilateral: Secondary | ICD-10-CM | POA: Diagnosis not present

## 2017-07-30 DIAGNOSIS — E113392 Type 2 diabetes mellitus with moderate nonproliferative diabetic retinopathy without macular edema, left eye: Secondary | ICD-10-CM

## 2017-08-23 ENCOUNTER — Emergency Department (HOSPITAL_COMMUNITY): Payer: PPO

## 2017-08-23 ENCOUNTER — Encounter (HOSPITAL_COMMUNITY)
Admission: EM | Disposition: A | Discharge status: Nursing Home (Includes ICF) | Payer: Self-pay | Source: Home / Self Care | Attending: Cardiothoracic Surgery

## 2017-08-23 ENCOUNTER — Inpatient Hospital Stay (HOSPITAL_COMMUNITY)
Admission: EM | Admit: 2017-08-23 | Discharge: 2017-09-26 | DRG: 001 | Disposition: A | Discharge status: Other Acute Inpatient Hospital | Payer: PPO | Attending: Cardiothoracic Surgery | Admitting: Cardiothoracic Surgery

## 2017-08-23 ENCOUNTER — Inpatient Hospital Stay (HOSPITAL_COMMUNITY): Payer: PPO

## 2017-08-23 DIAGNOSIS — E7800 Pure hypercholesterolemia, unspecified: Secondary | ICD-10-CM | POA: Diagnosis not present

## 2017-08-23 DIAGNOSIS — I350 Nonrheumatic aortic (valve) stenosis: Secondary | ICD-10-CM | POA: Diagnosis not present

## 2017-08-23 DIAGNOSIS — I252 Old myocardial infarction: Secondary | ICD-10-CM

## 2017-08-23 DIAGNOSIS — Z4682 Encounter for fitting and adjustment of non-vascular catheter: Secondary | ICD-10-CM | POA: Diagnosis not present

## 2017-08-23 DIAGNOSIS — Z951 Presence of aortocoronary bypass graft: Secondary | ICD-10-CM | POA: Diagnosis not present

## 2017-08-23 DIAGNOSIS — R609 Edema, unspecified: Secondary | ICD-10-CM | POA: Diagnosis not present

## 2017-08-23 DIAGNOSIS — Y95 Nosocomial condition: Secondary | ICD-10-CM | POA: Diagnosis not present

## 2017-08-23 DIAGNOSIS — Z720 Tobacco use: Secondary | ICD-10-CM | POA: Diagnosis present

## 2017-08-23 DIAGNOSIS — I5031 Acute diastolic (congestive) heart failure: Secondary | ICD-10-CM | POA: Diagnosis not present

## 2017-08-23 DIAGNOSIS — N179 Acute kidney failure, unspecified: Secondary | ICD-10-CM | POA: Diagnosis not present

## 2017-08-23 DIAGNOSIS — R531 Weakness: Secondary | ICD-10-CM | POA: Diagnosis not present

## 2017-08-23 DIAGNOSIS — J9600 Acute respiratory failure, unspecified whether with hypoxia or hypercapnia: Secondary | ICD-10-CM | POA: Diagnosis not present

## 2017-08-23 DIAGNOSIS — Z430 Encounter for attention to tracheostomy: Secondary | ICD-10-CM

## 2017-08-23 DIAGNOSIS — Z7984 Long term (current) use of oral hypoglycemic drugs: Secondary | ICD-10-CM

## 2017-08-23 DIAGNOSIS — I10 Essential (primary) hypertension: Secondary | ICD-10-CM | POA: Diagnosis present

## 2017-08-23 DIAGNOSIS — Z9689 Presence of other specified functional implants: Secondary | ICD-10-CM

## 2017-08-23 DIAGNOSIS — I700 Atherosclerosis of aorta: Secondary | ICD-10-CM | POA: Diagnosis present

## 2017-08-23 DIAGNOSIS — J9690 Respiratory failure, unspecified, unspecified whether with hypoxia or hypercapnia: Secondary | ICD-10-CM

## 2017-08-23 DIAGNOSIS — I492 Junctional premature depolarization: Secondary | ICD-10-CM | POA: Diagnosis present

## 2017-08-23 DIAGNOSIS — I213 ST elevation (STEMI) myocardial infarction of unspecified site: Secondary | ICD-10-CM | POA: Diagnosis present

## 2017-08-23 DIAGNOSIS — J189 Pneumonia, unspecified organism: Secondary | ICD-10-CM | POA: Diagnosis not present

## 2017-08-23 DIAGNOSIS — R0989 Other specified symptoms and signs involving the circulatory and respiratory systems: Secondary | ICD-10-CM | POA: Diagnosis not present

## 2017-08-23 DIAGNOSIS — L8990 Pressure ulcer of unspecified site, unspecified stage: Secondary | ICD-10-CM | POA: Insufficient documentation

## 2017-08-23 DIAGNOSIS — R570 Cardiogenic shock: Secondary | ICD-10-CM | POA: Diagnosis present

## 2017-08-23 DIAGNOSIS — R509 Fever, unspecified: Secondary | ICD-10-CM

## 2017-08-23 DIAGNOSIS — X58XXXA Exposure to other specified factors, initial encounter: Secondary | ICD-10-CM | POA: Diagnosis not present

## 2017-08-23 DIAGNOSIS — J90 Pleural effusion, not elsewhere classified: Secondary | ICD-10-CM | POA: Diagnosis not present

## 2017-08-23 DIAGNOSIS — I739 Peripheral vascular disease, unspecified: Secondary | ICD-10-CM | POA: Diagnosis present

## 2017-08-23 DIAGNOSIS — I2111 ST elevation (STEMI) myocardial infarction involving right coronary artery: Secondary | ICD-10-CM | POA: Diagnosis not present

## 2017-08-23 DIAGNOSIS — Z91040 Latex allergy status: Secondary | ICD-10-CM

## 2017-08-23 DIAGNOSIS — J181 Lobar pneumonia, unspecified organism: Secondary | ICD-10-CM | POA: Diagnosis not present

## 2017-08-23 DIAGNOSIS — J690 Pneumonitis due to inhalation of food and vomit: Secondary | ICD-10-CM | POA: Diagnosis not present

## 2017-08-23 DIAGNOSIS — I2510 Atherosclerotic heart disease of native coronary artery without angina pectoris: Secondary | ICD-10-CM | POA: Diagnosis not present

## 2017-08-23 DIAGNOSIS — I25118 Atherosclerotic heart disease of native coronary artery with other forms of angina pectoris: Secondary | ICD-10-CM | POA: Diagnosis not present

## 2017-08-23 DIAGNOSIS — E440 Moderate protein-calorie malnutrition: Secondary | ICD-10-CM | POA: Diagnosis not present

## 2017-08-23 DIAGNOSIS — D6959 Other secondary thrombocytopenia: Secondary | ICD-10-CM | POA: Diagnosis not present

## 2017-08-23 DIAGNOSIS — E119 Type 2 diabetes mellitus without complications: Secondary | ICD-10-CM

## 2017-08-23 DIAGNOSIS — R502 Drug induced fever: Secondary | ICD-10-CM | POA: Diagnosis not present

## 2017-08-23 DIAGNOSIS — A419 Sepsis, unspecified organism: Secondary | ICD-10-CM | POA: Diagnosis not present

## 2017-08-23 DIAGNOSIS — E874 Mixed disorder of acid-base balance: Secondary | ICD-10-CM | POA: Diagnosis not present

## 2017-08-23 DIAGNOSIS — R339 Retention of urine, unspecified: Secondary | ICD-10-CM | POA: Diagnosis not present

## 2017-08-23 DIAGNOSIS — I5082 Biventricular heart failure: Secondary | ICD-10-CM | POA: Diagnosis not present

## 2017-08-23 DIAGNOSIS — I361 Nonrheumatic tricuspid (valve) insufficiency: Secondary | ICD-10-CM

## 2017-08-23 DIAGNOSIS — Z4659 Encounter for fitting and adjustment of other gastrointestinal appliance and device: Secondary | ICD-10-CM

## 2017-08-23 DIAGNOSIS — I081 Rheumatic disorders of both mitral and tricuspid valves: Secondary | ICD-10-CM | POA: Diagnosis not present

## 2017-08-23 DIAGNOSIS — K090 Developmental odontogenic cysts: Secondary | ICD-10-CM | POA: Diagnosis not present

## 2017-08-23 DIAGNOSIS — L89310 Pressure ulcer of right buttock, unstageable: Secondary | ICD-10-CM | POA: Diagnosis not present

## 2017-08-23 DIAGNOSIS — R197 Diarrhea, unspecified: Secondary | ICD-10-CM | POA: Diagnosis not present

## 2017-08-23 DIAGNOSIS — J9602 Acute respiratory failure with hypercapnia: Secondary | ICD-10-CM | POA: Diagnosis not present

## 2017-08-23 DIAGNOSIS — R6521 Severe sepsis with septic shock: Secondary | ICD-10-CM | POA: Diagnosis not present

## 2017-08-23 DIAGNOSIS — Z419 Encounter for procedure for purposes other than remedying health state, unspecified: Secondary | ICD-10-CM

## 2017-08-23 DIAGNOSIS — F17210 Nicotine dependence, cigarettes, uncomplicated: Secondary | ICD-10-CM | POA: Diagnosis present

## 2017-08-23 DIAGNOSIS — J810 Acute pulmonary edema: Secondary | ICD-10-CM | POA: Diagnosis not present

## 2017-08-23 DIAGNOSIS — J95850 Mechanical complication of respirator: Secondary | ICD-10-CM

## 2017-08-23 DIAGNOSIS — I517 Cardiomegaly: Secondary | ICD-10-CM | POA: Diagnosis not present

## 2017-08-23 DIAGNOSIS — Z955 Presence of coronary angioplasty implant and graft: Secondary | ICD-10-CM

## 2017-08-23 DIAGNOSIS — I472 Ventricular tachycardia: Secondary | ICD-10-CM | POA: Diagnosis not present

## 2017-08-23 DIAGNOSIS — Z01810 Encounter for preprocedural cardiovascular examination: Secondary | ICD-10-CM | POA: Diagnosis not present

## 2017-08-23 DIAGNOSIS — R1310 Dysphagia, unspecified: Secondary | ICD-10-CM | POA: Diagnosis not present

## 2017-08-23 DIAGNOSIS — I2102 ST elevation (STEMI) myocardial infarction involving left anterior descending coronary artery: Secondary | ICD-10-CM

## 2017-08-23 DIAGNOSIS — Z452 Encounter for adjustment and management of vascular access device: Secondary | ICD-10-CM | POA: Diagnosis not present

## 2017-08-23 DIAGNOSIS — G9340 Encephalopathy, unspecified: Secondary | ICD-10-CM

## 2017-08-23 DIAGNOSIS — Z01818 Encounter for other preprocedural examination: Secondary | ICD-10-CM

## 2017-08-23 DIAGNOSIS — E871 Hypo-osmolality and hyponatremia: Secondary | ICD-10-CM | POA: Diagnosis not present

## 2017-08-23 DIAGNOSIS — N170 Acute kidney failure with tubular necrosis: Secondary | ICD-10-CM | POA: Diagnosis not present

## 2017-08-23 DIAGNOSIS — D62 Acute posthemorrhagic anemia: Secondary | ICD-10-CM | POA: Diagnosis present

## 2017-08-23 DIAGNOSIS — Z9289 Personal history of other medical treatment: Secondary | ICD-10-CM

## 2017-08-23 DIAGNOSIS — R079 Chest pain, unspecified: Secondary | ICD-10-CM | POA: Diagnosis not present

## 2017-08-23 DIAGNOSIS — K922 Gastrointestinal hemorrhage, unspecified: Secondary | ICD-10-CM | POA: Diagnosis not present

## 2017-08-23 DIAGNOSIS — I498 Other specified cardiac arrhythmias: Secondary | ICD-10-CM | POA: Diagnosis not present

## 2017-08-23 DIAGNOSIS — J9811 Atelectasis: Secondary | ICD-10-CM | POA: Diagnosis not present

## 2017-08-23 DIAGNOSIS — Z79899 Other long term (current) drug therapy: Secondary | ICD-10-CM

## 2017-08-23 DIAGNOSIS — E1165 Type 2 diabetes mellitus with hyperglycemia: Secondary | ICD-10-CM | POA: Diagnosis not present

## 2017-08-23 DIAGNOSIS — I2109 ST elevation (STEMI) myocardial infarction involving other coronary artery of anterior wall: Secondary | ICD-10-CM | POA: Diagnosis not present

## 2017-08-23 DIAGNOSIS — E870 Hyperosmolality and hypernatremia: Secondary | ICD-10-CM | POA: Diagnosis not present

## 2017-08-23 DIAGNOSIS — E785 Hyperlipidemia, unspecified: Secondary | ICD-10-CM | POA: Diagnosis present

## 2017-08-23 DIAGNOSIS — I484 Atypical atrial flutter: Secondary | ICD-10-CM | POA: Diagnosis not present

## 2017-08-23 DIAGNOSIS — J302 Other seasonal allergic rhinitis: Secondary | ICD-10-CM | POA: Diagnosis present

## 2017-08-23 DIAGNOSIS — J9601 Acute respiratory failure with hypoxia: Secondary | ICD-10-CM

## 2017-08-23 DIAGNOSIS — R32 Unspecified urinary incontinence: Secondary | ICD-10-CM | POA: Diagnosis present

## 2017-08-23 DIAGNOSIS — E876 Hypokalemia: Secondary | ICD-10-CM | POA: Diagnosis not present

## 2017-08-23 DIAGNOSIS — E1151 Type 2 diabetes mellitus with diabetic peripheral angiopathy without gangrene: Secondary | ICD-10-CM | POA: Diagnosis present

## 2017-08-23 DIAGNOSIS — I509 Heart failure, unspecified: Secondary | ICD-10-CM | POA: Diagnosis not present

## 2017-08-23 DIAGNOSIS — Z6827 Body mass index (BMI) 27.0-27.9, adult: Secondary | ICD-10-CM

## 2017-08-23 DIAGNOSIS — Z885 Allergy status to narcotic agent status: Secondary | ICD-10-CM

## 2017-08-23 DIAGNOSIS — R918 Other nonspecific abnormal finding of lung field: Secondary | ICD-10-CM | POA: Diagnosis not present

## 2017-08-23 DIAGNOSIS — M1991 Primary osteoarthritis, unspecified site: Secondary | ICD-10-CM | POA: Diagnosis not present

## 2017-08-23 DIAGNOSIS — K4090 Unilateral inguinal hernia, without obstruction or gangrene, not specified as recurrent: Secondary | ICD-10-CM | POA: Diagnosis not present

## 2017-08-23 DIAGNOSIS — I480 Paroxysmal atrial fibrillation: Secondary | ICD-10-CM | POA: Diagnosis not present

## 2017-08-23 DIAGNOSIS — I4892 Unspecified atrial flutter: Secondary | ICD-10-CM | POA: Diagnosis not present

## 2017-08-23 DIAGNOSIS — J449 Chronic obstructive pulmonary disease, unspecified: Secondary | ICD-10-CM | POA: Diagnosis present

## 2017-08-23 DIAGNOSIS — E878 Other disorders of electrolyte and fluid balance, not elsewhere classified: Secondary | ICD-10-CM | POA: Diagnosis not present

## 2017-08-23 DIAGNOSIS — R066 Hiccough: Secondary | ICD-10-CM | POA: Diagnosis not present

## 2017-08-23 DIAGNOSIS — I110 Hypertensive heart disease with heart failure: Secondary | ICD-10-CM | POA: Diagnosis not present

## 2017-08-23 DIAGNOSIS — I2582 Chronic total occlusion of coronary artery: Secondary | ICD-10-CM | POA: Diagnosis present

## 2017-08-23 DIAGNOSIS — Z7902 Long term (current) use of antithrombotics/antiplatelets: Secondary | ICD-10-CM

## 2017-08-23 DIAGNOSIS — T17990A Other foreign object in respiratory tract, part unspecified in causing asphyxiation, initial encounter: Secondary | ICD-10-CM | POA: Diagnosis not present

## 2017-08-23 DIAGNOSIS — J939 Pneumothorax, unspecified: Secondary | ICD-10-CM | POA: Diagnosis not present

## 2017-08-23 DIAGNOSIS — I214 Non-ST elevation (NSTEMI) myocardial infarction: Secondary | ICD-10-CM

## 2017-08-23 DIAGNOSIS — I25110 Atherosclerotic heart disease of native coronary artery with unstable angina pectoris: Secondary | ICD-10-CM | POA: Diagnosis not present

## 2017-08-23 DIAGNOSIS — I2101 ST elevation (STEMI) myocardial infarction involving left main coronary artery: Secondary | ICD-10-CM | POA: Diagnosis not present

## 2017-08-23 DIAGNOSIS — R0602 Shortness of breath: Secondary | ICD-10-CM | POA: Diagnosis not present

## 2017-08-23 DIAGNOSIS — I2781 Cor pulmonale (chronic): Secondary | ICD-10-CM | POA: Diagnosis not present

## 2017-08-23 HISTORY — PX: IABP INSERTION: CATH118242

## 2017-08-23 HISTORY — PX: LEFT HEART CATH AND CORS/GRAFTS ANGIOGRAPHY: CATH118250

## 2017-08-23 HISTORY — DX: Nicotine dependence, unspecified, uncomplicated: F17.200

## 2017-08-23 LAB — I-STAT CHEM 8, ED
BUN: 19 mg/dL (ref 6–20)
CREATININE: 2.7 mg/dL — AB (ref 0.61–1.24)
Calcium, Ion: 1.06 mmol/L — ABNORMAL LOW (ref 1.15–1.40)
Chloride: 100 mmol/L — ABNORMAL LOW (ref 101–111)
GLUCOSE: 166 mg/dL — AB (ref 65–99)
HCT: 40 % (ref 39.0–52.0)
HEMOGLOBIN: 13.6 g/dL (ref 13.0–17.0)
Potassium: 4.5 mmol/L (ref 3.5–5.1)
Sodium: 137 mmol/L (ref 135–145)
TCO2: 25 mmol/L (ref 22–32)

## 2017-08-23 LAB — CBC WITH DIFFERENTIAL/PLATELET
BASOS ABS: 0 10*3/uL (ref 0.0–0.1)
BASOS PCT: 0 %
EOS PCT: 0 %
Eosinophils Absolute: 0 10*3/uL (ref 0.0–0.7)
HCT: 40.5 % (ref 39.0–52.0)
Hemoglobin: 13.2 g/dL (ref 13.0–17.0)
LYMPHS PCT: 19 %
Lymphs Abs: 2.6 10*3/uL (ref 0.7–4.0)
MCH: 29.6 pg (ref 26.0–34.0)
MCHC: 32.6 g/dL (ref 30.0–36.0)
MCV: 90.8 fL (ref 78.0–100.0)
Monocytes Absolute: 1 10*3/uL (ref 0.1–1.0)
Monocytes Relative: 7 %
Neutro Abs: 10.4 10*3/uL — ABNORMAL HIGH (ref 1.7–7.7)
Neutrophils Relative %: 74 %
PLATELETS: 183 10*3/uL (ref 150–400)
RBC: 4.46 MIL/uL (ref 4.22–5.81)
RDW: 14.1 % (ref 11.5–15.5)
WBC: 14 10*3/uL — AB (ref 4.0–10.5)

## 2017-08-23 LAB — TROPONIN I
Troponin I: 1.95 ng/mL (ref <0.03)
Troponin I: 2.63 ng/mL (ref <0.03)

## 2017-08-23 LAB — LIPID PANEL
CHOL/HDL RATIO: 4 ratio
CHOLESTEROL: 125 mg/dL (ref 0–200)
HDL: 31 mg/dL — ABNORMAL LOW (ref >40)
LDL Cholesterol: 47 mg/dL (ref 0–99)
TRIGLYCERIDES: 233 mg/dL — AB (ref <150)
VLDL: 47 mg/dL — AB (ref 0–40)

## 2017-08-23 LAB — COMPREHENSIVE METABOLIC PANEL
ALT: 22 U/L (ref 17–63)
AST: 42 U/L — ABNORMAL HIGH (ref 15–41)
Albumin: 3.5 g/dL (ref 3.5–5.0)
Alkaline Phosphatase: 55 U/L (ref 38–126)
Anion gap: 11 (ref 5–15)
BUN: 16 mg/dL (ref 6–20)
CHLORIDE: 100 mmol/L — AB (ref 101–111)
CO2: 23 mmol/L (ref 22–32)
CREATININE: 2.82 mg/dL — AB (ref 0.61–1.24)
Calcium: 8.7 mg/dL — ABNORMAL LOW (ref 8.9–10.3)
GFR calc non Af Amer: 22 mL/min — ABNORMAL LOW (ref >60)
GFR, EST AFRICAN AMERICAN: 26 mL/min — AB (ref >60)
Glucose, Bld: 169 mg/dL — ABNORMAL HIGH (ref 65–99)
POTASSIUM: 4.4 mmol/L (ref 3.5–5.1)
SODIUM: 134 mmol/L — AB (ref 135–145)
Total Bilirubin: 0.5 mg/dL (ref 0.3–1.2)
Total Protein: 6.5 g/dL (ref 6.5–8.1)

## 2017-08-23 LAB — HEPATIC FUNCTION PANEL
ALK PHOS: 55 U/L (ref 38–126)
ALT: 23 U/L (ref 17–63)
AST: 50 U/L — ABNORMAL HIGH (ref 15–41)
Albumin: 3.4 g/dL — ABNORMAL LOW (ref 3.5–5.0)
BILIRUBIN DIRECT: 0.1 mg/dL (ref 0.1–0.5)
BILIRUBIN INDIRECT: 0.2 mg/dL — AB (ref 0.3–0.9)
BILIRUBIN TOTAL: 0.3 mg/dL (ref 0.3–1.2)
Total Protein: 6.5 g/dL (ref 6.5–8.1)

## 2017-08-23 LAB — T4, FREE: FREE T4: 1.07 ng/dL (ref 0.61–1.12)

## 2017-08-23 LAB — MRSA PCR SCREENING: MRSA BY PCR: NEGATIVE

## 2017-08-23 LAB — APTT: APTT: 28 s (ref 24–36)

## 2017-08-23 LAB — LACTIC ACID, PLASMA: Lactic Acid, Venous: 2.6 mmol/L (ref 0.5–1.9)

## 2017-08-23 LAB — HEMOGLOBIN A1C
HEMOGLOBIN A1C: 6 % — AB (ref 4.8–5.6)
MEAN PLASMA GLUCOSE: 125.5 mg/dL

## 2017-08-23 LAB — PROTIME-INR
INR: 1.11
Prothrombin Time: 14.3 seconds (ref 11.4–15.2)

## 2017-08-23 LAB — TSH: TSH: 1.166 u[IU]/mL (ref 0.350–4.500)

## 2017-08-23 LAB — BRAIN NATRIURETIC PEPTIDE: B Natriuretic Peptide: 200 pg/mL — ABNORMAL HIGH (ref 0.0–100.0)

## 2017-08-23 SURGERY — LEFT HEART CATH AND CORS/GRAFTS ANGIOGRAPHY
Anesthesia: LOCAL

## 2017-08-23 MED ORDER — HEPARIN (PORCINE) IN NACL 100-0.45 UNIT/ML-% IJ SOLN
950.0000 [IU]/h | INTRAMUSCULAR | Status: DC
  Administered 2017-08-23 – 2017-08-24 (×2): 950 [IU]/h via INTRAVENOUS
  Filled 2017-08-23 (×2): qty 250

## 2017-08-23 MED ORDER — LIDOCAINE HCL (PF) 1 % IJ SOLN
INTRAMUSCULAR | Status: DC | PRN
  Administered 2017-08-23: 20 mL

## 2017-08-23 MED ORDER — INFLUENZA VAC SPLIT QUAD 0.5 ML IM SUSY
0.5000 mL | PREFILLED_SYRINGE | INTRAMUSCULAR | Status: DC
Start: 2017-08-24 — End: 2017-08-24

## 2017-08-23 MED ORDER — HEPARIN SODIUM (PORCINE) 1000 UNIT/ML IJ SOLN
INTRAMUSCULAR | Status: AC
  Filled 2017-08-23: qty 1

## 2017-08-23 MED ORDER — ONDANSETRON HCL 4 MG/2ML IJ SOLN: 4.0000 mg | Freq: Four times a day (QID) | INTRAMUSCULAR | Status: DC | PRN

## 2017-08-23 MED ORDER — IOPAMIDOL (ISOVUE-370) INJECTION 76%
INTRAVENOUS | Status: AC
  Filled 2017-08-23: qty 100

## 2017-08-23 MED ORDER — ONDANSETRON HCL 4 MG/2ML IJ SOLN
4.0000 mg | Freq: Four times a day (QID) | INTRAMUSCULAR | Status: DC | PRN
  Administered 2017-08-23 – 2017-08-25 (×5): 4 mg via INTRAVENOUS
  Filled 2017-08-23 (×6): qty 2

## 2017-08-23 MED ORDER — HEPARIN (PORCINE) IN NACL 2-0.9 UNIT/ML-% IJ SOLN
INTRAMUSCULAR | Status: AC
  Filled 2017-08-23: qty 1000

## 2017-08-23 MED ORDER — SODIUM CHLORIDE 0.9% FLUSH: 3.0000 mL | INTRAVENOUS | Status: DC | PRN

## 2017-08-23 MED ORDER — SODIUM CHLORIDE 0.9% FLUSH
3.0000 mL | Freq: Two times a day (BID) | INTRAVENOUS | Status: DC
  Administered 2017-08-23 – 2017-08-24 (×3): 3 mL via INTRAVENOUS

## 2017-08-23 MED ORDER — HEPARIN (PORCINE) IN NACL 2-0.9 UNIT/ML-% IJ SOLN
INTRAMUSCULAR | Status: AC | PRN
  Administered 2017-08-23: 1500 mL

## 2017-08-23 MED ORDER — SODIUM CHLORIDE 0.9 % IV SOLN: INTRAVENOUS | Status: AC

## 2017-08-23 MED ORDER — SODIUM CHLORIDE 0.9 % IV SOLN
INTRAVENOUS | Status: AC | PRN
  Administered 2017-08-23: 999 mL/h via INTRAVENOUS

## 2017-08-23 MED ORDER — ORAL CARE MOUTH RINSE
15.0000 mL | Freq: Two times a day (BID) | OROMUCOSAL | Status: DC
  Administered 2017-08-23 – 2017-08-27 (×4): 15 mL via OROMUCOSAL

## 2017-08-23 MED ORDER — IOPAMIDOL (ISOVUE-370) INJECTION 76%
INTRAVENOUS | Status: DC | PRN
  Administered 2017-08-23: 60 mL via INTRAVENOUS

## 2017-08-23 MED ORDER — SODIUM CHLORIDE 0.9 % IV BOLUS (SEPSIS)
500.0000 mL | Freq: Once | INTRAVENOUS | Status: AC
  Administered 2017-08-23: 500 mL via INTRAVENOUS

## 2017-08-23 MED ORDER — ATORVASTATIN CALCIUM 80 MG PO TABS
80.0000 mg | ORAL_TABLET | Freq: Every day | ORAL | Status: DC
  Administered 2017-08-24 – 2017-08-27 (×4): 80 mg via ORAL
  Filled 2017-08-23 (×4): qty 1

## 2017-08-23 MED ORDER — LIDOCAINE HCL (PF) 1 % IJ SOLN
INTRAMUSCULAR | Status: AC
  Filled 2017-08-23: qty 30

## 2017-08-23 MED ORDER — ASPIRIN EC 81 MG PO TBEC
81.0000 mg | DELAYED_RELEASE_TABLET | Freq: Every day | ORAL | Status: DC
  Administered 2017-08-24 – 2017-08-27 (×4): 81 mg via ORAL
  Filled 2017-08-23 (×5): qty 1

## 2017-08-23 MED ORDER — GLIMEPIRIDE 4 MG PO TABS
4.0000 mg | ORAL_TABLET | Freq: Two times a day (BID) | ORAL | Status: DC
  Filled 2017-08-23: qty 1

## 2017-08-23 MED ORDER — ASPIRIN 81 MG PO CHEW: 81.0000 mg | CHEWABLE_TABLET | Freq: Every day | ORAL | Status: DC

## 2017-08-23 MED ORDER — HEPARIN SODIUM (PORCINE) 5000 UNIT/ML IJ SOLN
4000.0000 [IU] | Freq: Once | INTRAMUSCULAR | Status: AC
  Administered 2017-08-23: 4000 [IU] via INTRAVENOUS

## 2017-08-23 MED ORDER — SODIUM CHLORIDE 0.9 % IV SOLN: 250.0000 mL | INTRAVENOUS | Status: DC | PRN

## 2017-08-23 MED ORDER — HEPARIN BOLUS VIA INFUSION: 4000.0000 [IU] | Freq: Once | INTRAVENOUS | Status: DC

## 2017-08-23 MED ORDER — ACETAMINOPHEN 325 MG PO TABS
650.0000 mg | ORAL_TABLET | ORAL | Status: DC | PRN
  Administered 2017-08-23: 650 mg via ORAL
  Filled 2017-08-23: qty 2

## 2017-08-23 MED ORDER — HEPARIN SODIUM (PORCINE) 5000 UNIT/ML IJ SOLN
INTRAMUSCULAR | Status: AC
  Filled 2017-08-23: qty 1

## 2017-08-23 MED ORDER — ACETAMINOPHEN 325 MG PO TABS: 650.0000 mg | ORAL_TABLET | ORAL | Status: DC | PRN

## 2017-08-23 MED ORDER — SODIUM CHLORIDE 0.9 % IV SOLN: INTRAVENOUS | Status: DC

## 2017-08-23 MED ORDER — HEPARIN SODIUM (PORCINE) 1000 UNIT/ML IJ SOLN
INTRAMUSCULAR | Status: DC | PRN
  Administered 2017-08-23: 5000 [IU] via INTRAVENOUS

## 2017-08-23 MED ORDER — NITROGLYCERIN 0.4 MG SL SUBL: 0.4000 mg | SUBLINGUAL_TABLET | SUBLINGUAL | Status: DC | PRN

## 2017-08-23 SURGICAL SUPPLY — 11 items
BALLN LINEAR 7.5FR IABP 40CC (BALLOONS) ×2
BALLOON LINEAR 7.5FR IABP 40CC (BALLOONS) IMPLANT
CATH INFINITI 5FR MULTPACK ANG (CATHETERS) ×1 IMPLANT
DEVICE SECURE STATLOCK IABP (MISCELLANEOUS) ×2 IMPLANT
KIT HEART LEFT (KITS) ×2 IMPLANT
PACK CARDIAC CATHETERIZATION (CUSTOM PROCEDURE TRAY) ×2 IMPLANT
SHEATH PINNACLE 6F 10CM (SHEATH) ×1 IMPLANT
SYR MEDRAD MARK V 150ML (SYRINGE) ×2 IMPLANT
TRANSDUCER W/STOPCOCK (MISCELLANEOUS) ×2 IMPLANT
WIRE EMERALD 3MM-J .035X150CM (WIRE) ×1 IMPLANT
WIRE HI TORQ VERSACORE-J 145CM (WIRE) ×1 IMPLANT

## 2017-08-23 NOTE — Progress Notes (Signed)
   08/23/17 2000  Clinical Encounter Type  Visited With Patient  Visit Type Critical Care  Referral From Nurse  Consult/Referral To Chaplain  Spiritual Encounters  Spiritual Needs (none identified)  Stress Factors  Patient Stress Factors Health changes  Had a brief chance to visit with PT before called to a code page.  Spoke to charge who stated no family that needed to be contact per PT.

## 2017-08-23 NOTE — Progress Notes (Signed)
Orthopedic Tech Progress Note Patient Details:  Clinton Harrington [birth year] [id number]  Ortho Devices Type of Ortho Device: Knee Immobilizer Ortho Device/Splint Location: RLE Ortho Device/Splint Interventions: Ordered, Application   Jennye Moccasin 08/23/2017, 8:30 PM

## 2017-08-23 NOTE — ED Notes (Signed)
Code Stemi activated @ [id number]

## 2017-08-23 NOTE — Progress Notes (Addendum)
Patient was found to have multivessel disease. Will consult CT surgery. He had a balloon pump placed.  Will get echo.  Will place on a heparin gtt, along with the other medications and admit to ICU.

## 2017-08-23 NOTE — H&P (Addendum)
CARDIOLOGY H&P  HPI:  Patient is a 66 y/o Caucasian M with hx CADs/p BMS to RCA in 1999 (due to an inferior STEMI), significant PVD, HTN, Type 2 DM (non-insulin dependent), HLP, Tobacco use comes in today complaining of substernal chest pain that started last night at 11 pm. He had associated SOB, nausea, diaphoresis. He didn't come initially due to the fact that he was taking care of his mother. Pain did not improve and was worse with exertion. Per ER staff, they were told by EMS that patient is arousable, but appeared very dazed and was found to have a systolic BP in the 60's. He was given of saline bolus and started on epi gtt. On arrival to the ER, he was d/c on the epi and a repeat bolus was given which improved his pressures into the 80's. He was also found to have an anterior ST elevations with inferior depressions on the EKG which were new. He also appeared to have what looked like junctional escape rhythm. Patient was still complaining of chest pain, graded it to be 7/10. Hence, Dr. Allyson Sabal was notified and patient was taken to cath lab for further intervention.   Patient hasn't established care with a cardiologist since Dr. Erling Conte in 1999, when he had his stent placed.   He was seen by Dr. Herbie Baltimore for a consult for preop risk stratification for percutaneous endovascular procedure in 12/2015. He told Dr. Herbie Baltimore at the time that he had a negative stress test in 6 years ago and that it was normal.    Review of Systems:     Cardiac Review of Systems: {Y] = yes  = no  Chest Pain [  Y ]  Resting SOB [ Y  ] Exertional SOB  [Y  ]  Orthopnea [  ]   Pedal Edema [   ]    Palpitations [  ] Syncope  [  ]   Presyncope [   ]  General Review of Systems: [Y] = yes [  ]=no Constitional: recent weight change [  ]; anorexia [ Y ]; fatigue [  ]; nausea [  ]; night sweats [  ]; fever [  ]; or chills [  ];                                                                     Dental: poor dentition[   ];   Eye : blurred vision [  ]; diplopia [   ]; vision changes [  ];  Amaurosis fugax[  ]; Resp: cough [  ];  wheezing[  ];  hemoptysis[  ]; shortness of breath[ Y ]; paroxysmal nocturnal dyspnea[  ]; dyspnea on exertion[  ]; or orthopnea[  ];  GI:  gallstones[  ], vomiting[  ];  dysphagia[  ]; melena[  ];  hematochezia [  ]; heartburn[  ];   GU: kidney stones [  ]; hematuria[  ];   dysuria [  ];  nocturia[  ];               Skin: rash [  ], swelling[  ];, hair loss[  ];  peripheral edema[  ];  or itching[  ]; Musculosketetal: myalgias[  ];  joint swelling[  ];  joint erythema[  ];  joint pain[  ];  back pain[  ];  Heme/Lymph: bruising[  ];  bleeding[  ];  anemia[  ];  Neuro: TIA[  ];  headaches[  ];  stroke[  ];  vertigo[  ];  seizures[  ];   paresthesias[  ];  difficulty walking[  ];  Psych:depression[  ]; anxiety[  ];  Endocrine: diabetes[Y  ];  thyroid dysfunction[  ];  Other:  Past Medical History:  Diagnosis Date  . Arthritis   . Diabetes (HCC)   . Diabetes mellitus Jan. 1999  . High cholesterol   . Hypertension   . Myocardial infarct (HCC)   . Peripheral vascular disease (HCC)   . Seasonal allergies   . Urine incontinence     @   Allergies  Allergen Reactions  . Aspirin Other (See Comments)    Stomach upset  . Latex Rash  . Oxycodone-Acetaminophen Itching    Social History   Social History  . Marital status: Divorced    Spouse name: N/A  . Number of children: N/A  . Years of education: N/A   Occupational History  . Not on file.   Social History Main Topics  . Smoking status: Current Every Day Smoker    Packs/day: 0.50    Years: 45.00    Types: Cigarettes  . Smokeless tobacco: Never Used     Comment: 1/2 pk per day  . Alcohol use No  . Drug use: No  . Sexual activity: Not on file   Other Topics Concern  . Not on file   Social History Narrative   Disability since 2015.  Previously worked at [employer] for 10 years.    He lives at home  with mother with dementia.   He has one daughter.     Family History  Problem Relation Age of Onset  . Arthritis Mother   . Hypertension Mother   . Heart disease Maternal Grandmother   . Mental illness Maternal Grandmother    PHYSICAL EXAM: Vitals:   08/23/17 1733 08/23/17 1756  BP: (!) 86/56   Pulse: 67   Resp: (!) 22   Temp:    SpO2: 95% (!) 9%   General:  Well appearing. Mild distress 2/2 to chest pain HEENT: normal Neck: supple. No lymphadenopathy or thryomegaly appreciated. Cor: PMI nondisplaced. Bradycardic, regular. No rubs, gallops or murmurs. Lungs: clear Abdomen: soft, nontender, nondistended. No hepatosplenomegaly. No bruits or masses. Good bowel sounds. Extremities: no cyanosis, clubbing, rash, edema Neuro: alert & oriented x 3, cranial nerves grossly intact. moves all 4 extremities w/o difficulty. Affect pleasant.  ECG:  Results for orders placed or performed during the hospital encounter of 08/23/17 (from the past 24 hour(s))  CBC with Differential/Platelet     Status: Abnormal   Collection Time: 08/23/17  5:24 PM  Result Value Ref Range   WBC 14.0 (H) 4.0 - 10.5 K/uL   RBC 4.46 4.22 - 5.81 MIL/uL   Hemoglobin 13.2 13.0 - 17.0 g/dL   HCT 82.9 56.2 - 13.0 %   MCV 90.8 78.0 - 100.0 fL   MCH 29.6 26.0 - 34.0 pg   MCHC 32.6 30.0 - 36.0 g/dL   RDW 86.5 78.4 - 69.6 %   Platelets 183 150 - 400 K/uL   Neutrophils Relative % 74 %   Neutro Abs 10.4 (H) 1.7 - 7.7 K/uL   Lymphocytes Relative 19 %   Lymphs Abs 2.6 0.7 -  4.0 K/uL   Monocytes Relative 7 %   Monocytes Absolute 1.0 0.1 - 1.0 K/uL   Eosinophils Relative 0 %   Eosinophils Absolute 0.0 0.0 - 0.7 K/uL   Basophils Relative 0 %   Basophils Absolute 0.0 0.0 - 0.1 K/uL  I-Stat Chem 8, ED     Status: Abnormal   Collection Time: 08/23/17  5:33 PM  Result Value Ref Range   Sodium 137 135 - 145 mmol/L   Potassium 4.5 3.5 - 5.1 mmol/L   Chloride 100 (L) 101 - 111 mmol/L   BUN 19 6 - 20 mg/dL   Creatinine,  Ser 1.61 (H) 0.61 - 1.24 mg/dL   Glucose, Bld 096 (H) 65 - 99 mg/dL   Calcium, Ion 0.45 (L) 1.15 - 1.40 mmol/L   TCO2 25 22 - 32 mmol/L   Hemoglobin 13.6 13.0 - 17.0 g/dL   HCT 40.9 81.1 - 91.4 %   Dg Chest Port 1 View  Result Date: 08/23/2017 CLINICAL DATA:  Acute chest pain for 2 days. EXAM: PORTABLE CHEST 1 VIEW COMPARISON:  01/02/2016 FINDINGS: Cardiomegaly again identified. There is no evidence of focal airspace disease, pulmonary edema, suspicious pulmonary nodule/mass, pleural effusion, or pneumothorax. No acute bony abnormalities are identified. IMPRESSION: Cardiomegaly without evidence of acute cardiopulmonary disease. Electronically Signed   By: Harmon Pier M.D.   On: 08/23/2017 17:45   ASSESSMENT:  Anterior STEMI Shock, most likely cardiac in etiology 2/2 to the above Junctional escape rhythm on initial EKG Hx of CAD s/p inferior STEMI in the past. (patient claims to have 2 stents, but notes indicate only one BMS to the RCA) PVD, fem-pop artery bypass in the LLE s/p balloon angioplasty HTN, HLP, Type 2 DM, Tobacco abuse  PLAN/DISCUSSION:  Will await results of coronary anatomy findings.  Patient was given 324 ASA, this is not a true allergy. Patient on plavix due to the "intolerance" to asa and for the PVD. Cr dysfunction most likely 2/2 to the shock state from the STEMI.  Will continue to monitor the rhythm closely.  May require mechanical support.  Will need echo. Asa, high intensity statin (lipitor ), beta blocker may need to held given the above. TSH, Lipid panel and A1c Will be ordered.  Recommended smoking cessation.

## 2017-08-23 NOTE — ED Provider Notes (Signed)
[facility]-EMERGENCY DEPT Provider Note   CSN: [id number] Arrival date & time: 08/23/17  1708   levelcaveat acuity of situation  History   Chief Complaint Chief Complaint  Patient presents with  . Code STEMI    HPI Clinton Harrington is a 66 y.o. male.Complained of anterior chest pain nonradiating onset 11 PM yesterday. Associated symptoms include shortness of breath and mild nausea. He denies any shortness of breath or nausea present. Pain is worse with getting up to walk improved with lying down. Presently pain is mild. EMS brought patient. Noted his blood pressure to be approximate 60 systolic upon their arrival EMS treated patient with aspirin, 500 mL normal saline intravenous bolus and intravenous epinephrine drip.  HPI  Past Medical History:  Diagnosis Date  . Arthritis   . Diabetes (HCC)   . Diabetes mellitus Jan. 1999  . High cholesterol   . Hypertension   . Myocardial infarct (HCC)   . Peripheral vascular disease (HCC)   . Seasonal allergies   . Urine incontinence     Patient Active Problem List   Diagnosis Date Noted  . Diabetic polyneuropathy associated with diabetes mellitus due to underlying condition (HCC) 03/28/2016  . PAD (peripheral artery disease) (HCC) 01/23/2016  . Extremity atherosclerosis with gangrene (HCC) 01/01/2016  . S/P CABG x 4   . Dyslipidemia, goal LDL below 70   . Atherosclerosis of native arteries of the extremities with gangrene (HCC) 12/29/2015  . Ulcer of heel and midfoot (HCC) 11/09/2015  . Diabetes (HCC) 10/30/2012  . Hyperlipemia 10/30/2012  . CAD (coronary artery disease) 10/30/2012  . HTN (hypertension) 10/30/2012  . Tobacco use 10/30/2012    Past Surgical History:  Procedure Laterality Date  . CORONARY STENT PLACEMENT  Jan.  1999  . FEMORAL-POPLITEAL BYPASS GRAFT Left 01/03/2016   Procedure: LEFT COMMON FEMORAL-BELOW KNEE POPLITEAL ARTERY BYPASS GRAFT USING NON REVERSED TRANSLOCATED SAPHENOUS VEIN GRAFT;  Surgeon: Pryor Ochoa,  MD;  Location: [facility] OR;  Service: Vascular;  Laterality: Left;  . INTRAOPERATIVE ARTERIOGRAM Left 01/03/2016   Procedure: INTRA OPERATIVE ARTERIOGRAM LEFT LEG;  Surgeon: Pryor Ochoa, MD;  Location: [facility] OR;  Service: Vascular;  Laterality: Left;  . PERIPHERAL VASCULAR CATHETERIZATION N/A 01/01/2016   Procedure: Abdominal Aortogram w/Lower Extremity;  Surgeon: Fransisco Hertz, MD;  Location: [facility] INVASIVE CV LAB;  Service: Cardiovascular;  Laterality: N/A;  . PERIPHERAL VASCULAR CATHETERIZATION Left 01/01/2016   Procedure: Peripheral Vascular Balloon Angioplasty;  Surgeon: Fransisco Hertz, MD;  Location: [facility] INVASIVE CV LAB;  Service: Cardiovascular;  Laterality: Left;  Popliteal artery  . PERIPHERAL VASCULAR CATHETERIZATION N/A 12/04/2016   Procedure: Abdominal Aortogram w/Lower Extremity;  Surgeon: Maeola Harman, MD;  Location: [facility] INVASIVE CV LAB;  Service: Cardiovascular;  Laterality: N/A;  . PERIPHERAL VASCULAR CATHETERIZATION Left 12/04/2016   Procedure: Peripheral Vascular Balloon Angioplasty;  Surgeon: Maeola Harman, MD;  Location: [facility] INVASIVE CV LAB;  Service: Cardiovascular;  Laterality: Left;  Fem_Pop bypass       Home Medications    Prior to Admission medications   Medication Sig Start Date End Date Taking? Authorizing Provider  acetaminophen (TYLENOL) 650 MG CR tablet Take 1,300 mg by mouth 2 (two) times daily.    [provider]  amLODipine (NORVASC) 5 MG tablet Take 5 mg by mouth daily. 11/01/16   [provider]  carvedilol (COREG) 3.125 MG tablet Take 1 tablet (3.125 mg total) by mouth 2 (two) times daily with a meal. 11/10/12   Kriste Basque  R, DO  clopidogrel (PLAVIX) 75 MG tablet Take 1 tablet (75 mg total) by mouth daily. 03/25/17   Maeola Harman, MD  gabapentin (NEURONTIN) 300 MG capsule Take 900 mg by mouth 3 (three) times daily.  11/13/15   [provider]  glimepiride (AMARYL) 4 MG tablet Take 4 mg by mouth 2 (two) times  daily.    [provider]  lisinopril (PRINIVIL,ZESTRIL) 40 MG tablet Take 1 tablet (40 mg total) by mouth daily. 10/30/12   Terressa Koyanagi, DO  metFORMIN (GLUCOPHAGE) 1000 MG tablet Take 1 tablet (1,000 mg total) by mouth 2 (two) times daily with a meal. 12/06/16   Raymond Gurney, PA-C  pravastatin (PRAVACHOL) 40 MG tablet Take 1 tablet (40 mg total) by mouth daily. 10/30/12   Terressa Koyanagi, DO  traMADol (ULTRAM) 50 MG tablet Take 1 tablet (50 mg total) by mouth every 6 (six) hours as needed for moderate pain. 12/05/16   Raymond Gurney, PA-C  traZODone (DESYREL) 100 MG tablet Take 100 mg by mouth at bedtime.    [provider]    Family History Family History  Problem Relation Age of Onset  . Arthritis Mother   . Hypertension Mother   . Heart disease Maternal Grandmother   . Mental illness Maternal Grandmother     Social History Social History  Substance Use Topics  . Smoking status: Current Every Day Smoker    Packs/day: 0.50    Years: 45.00    Types: Cigarettes  . Smokeless tobacco: Never Used     Comment: 1/2 pk per day  . Alcohol use No     Allergies   Aspirin; Latex; and Oxycodone-acetaminophen   Review of Systems Review of Systems  Unable to perform ROS: Acuity of condition  Respiratory: Positive for shortness of breath.   Gastrointestinal: Positive for nausea.  Allergic/Immunologic: Positive for immunocompromised state.       Diabetic     Physical Exam Updated Vital Signs Temp (!) 97.4 F (36.3 C) (Oral)   Physical Exam  Constitutional: He appears well-developed and well-nourished.  Appears mildly uncomfortable  HENT:  Head: Normocephalic and atraumatic.  Eyes: Pupils are equal, round, and reactive to light. Conjunctivae are normal.  Neck: Neck supple. No tracheal deviation present. No thyromegaly present.  Cardiovascular: Regular rhythm.   No murmur heard. Mildly bradycardic  Pulmonary/Chest: Effort normal and breath sounds  normal.  Abdominal: Soft. Bowel sounds are normal. He exhibits no distension. There is no tenderness.  Musculoskeletal: Normal range of motion. He exhibits no edema or tenderness.  Neurological: He is alert. Coordination normal.  Skin: Skin is warm and dry. No rash noted.  Psychiatric: He has a normal mood and affect.  Nursing note and vitals reviewed.    ED Treatments / Results  Labs (all labs ordered are listed, but only abnormal results are displayed) Labs Reviewed  CBC WITH DIFFERENTIAL/PLATELET  PROTIME-INR  APTT  COMPREHENSIVE METABOLIC PANEL  TROPONIN I  LIPID PANEL  I-STAT CHEM 8, ED    EKG  EKG Interpretation  Date/Time:  Saturday August 23 2017 17:14:58 EDT Ventricular Rate:  54 PR Interval:    QRS Duration: 137 QT Interval:  470 QTC Calculation: 446 R Axis:   25 Text Interpretation:  ATRIAL PACED RHYTHM Right bundle branch block Inferior infarct, age indeterminate Minimal ST elevation, anterior leads Baseline wander in lead(s) II III aVF ST elevation consider anterior injury or acute infarct Confirmed by Doug Sou [id number]) on 08/23/2017  5:18:19 PM       Radiology No results found.  Procedures Procedures (including critical care time)  Medications Ordered in ED Medications  heparin 5000 UNIT/ML injection (not administered)  0.9 %  sodium chloride infusion (not administered)  heparin injection 4,000 Units (not administered)   Results for orders placed or performed during the hospital encounter of 08/23/17  CBC with Differential/Platelet  Result Value Ref Range   WBC 14.0 (H) 4.0 - 10.5 K/uL   RBC 4.46 4.22 - 5.81 MIL/uL   Hemoglobin 13.2 13.0 - 17.0 g/dL   HCT 08.6 57.8 - 46.9 %   MCV 90.8 78.0 - 100.0 fL   MCH 29.6 26.0 - 34.0 pg   MCHC 32.6 30.0 - 36.0 g/dL   RDW 62.9 52.8 - 41.3 %   Platelets 183 150 - 400 K/uL   Neutrophils Relative % 74 %   Neutro Abs 10.4 (H) 1.7 - 7.7 K/uL   Lymphocytes Relative 19 %   Lymphs Abs 2.6 0.7 - 4.0  K/uL   Monocytes Relative 7 %   Monocytes Absolute 1.0 0.1 - 1.0 K/uL   Eosinophils Relative 0 %   Eosinophils Absolute 0.0 0.0 - 0.7 K/uL   Basophils Relative 0 %   Basophils Absolute 0.0 0.0 - 0.1 K/uL  I-Stat Chem 8, ED  Result Value Ref Range   Sodium 137 135 - 145 mmol/L   Potassium 4.5 3.5 - 5.1 mmol/L   Chloride 100 (L) 101 - 111 mmol/L   BUN 19 6 - 20 mg/dL   Creatinine, Ser 2.44 (H) 0.61 - 1.24 mg/dL   Glucose, Bld 010 (H) 65 - 99 mg/dL   Calcium, Ion 2.72 (L) 1.15 - 1.40 mmol/L   TCO2 25 22 - 32 mmol/L   Hemoglobin 13.6 13.0 - 17.0 g/dL   HCT 53.6 64.4 - 03.4 %   Dg Chest Port 1 View  Result Date: 08/23/2017 CLINICAL DATA:  Acute chest pain for 2 days. EXAM: PORTABLE CHEST 1 VIEW COMPARISON:  01/02/2016 FINDINGS: Cardiomegaly again identified. There is no evidence of focal airspace disease, pulmonary edema, suspicious pulmonary nodule/mass, pleural effusion, or pneumothorax. No acute bony abnormalities are identified. IMPRESSION: Cardiomegaly without evidence of acute cardiopulmonary disease. Electronically Signed   By: Harmon Pier M.D.   On: 08/23/2017 17:45  Chest x-ray viewed by me  Initial Impression / Assessment and Plan / ED Course  I have reviewed the triage vital signs and the nursing notes.  Pertinent labs & imaging results that were available during my care of the patient were reviewed by me and considered in my medical decision making (see chart for details).     Code STEMI called by me based on patient presentation and EKG changes. Dr. Allyson Sabal from cardiology service consulted and will evaluate patient in the emergency department Intravenous heparin bolus ordered by me as well as 500 mL intravenous normal saline bolus due to patient's hypotension. Patient was taken emergently to cardiac catheterization laboratory Final Clinical Impressions(s) / ED Diagnoses  Diagnosis #1acute anterior wall STEMI #2 hypotension CRITICAL CARE Performed by: Doug Sou Total  critical care time: 30 minutes Critical care time was exclusive of separately billable procedures and treating other patients. Critical care was necessary to treat or prevent imminent or life-threatening deterioration. Critical care was time spent personally by me on the following activities: development of treatment plan with patient and/or surrogate as well as nursing, discussions with consultants, evaluation of patient's response to treatment, examination of patient, obtaining history  from patient or surrogate, ordering and performing treatments and interventions, ordering and review of laboratory studies, ordering and review of radiographic studies, pulse oximetry and re-evaluation of patient's condition. Final diagnoses:  None    New Prescriptions New Prescriptions   No medications on file     Doug Sou, MD 08/23/17 1754

## 2017-08-23 NOTE — ED Triage Notes (Signed)
Pt brought in by EMS for chest pain that started last night around 2300. Pt called EMS to his house last night but when they arrived he refused transport. Pt given  of aspirin, EMS started pt on an epi drip, and given NS bolus en route to hospital. Pt pale upon assessment. Pt denies being SOB.

## 2017-08-23 NOTE — Progress Notes (Signed)
Critical lab result Lactic acid 2.6. Dr. Hildred Alamin aware. Order noted to trend lactic acid at this time.

## 2017-08-23 NOTE — Progress Notes (Signed)
ANTICOAGULATION CONSULT NOTE - Initial Consult  Pharmacy Consult for heparin Indication: chest pain/ACS  Allergies  Allergen Reactions  . Aspirin Other (See Comments)    Stomach upset  . Latex Rash  . Oxycodone-Acetaminophen Itching    Patient Measurements: Height:  (180.3 cm) Weight: 200 lb (90.7 kg) IBW/kg (Calculated) : 75.3 Heparin Dosing Weight: 90.7  Vital Signs: Temp: 97.4 F (36.3 C) (09/15 1718) Temp Source: Oral (09/15 1718) BP: 107/68 (09/15 1834) Pulse Rate: 56 (09/15 1834)  Labs:  Recent Labs  08/23/17 1724 08/23/17 1733  HGB 13.2 13.6  HCT 40.5 40.0  PLT 183  --   APTT 28  --   LABPROT 14.3  --   INR 1.11  --   CREATININE 2.82* 2.70*  TROPONINI 1.95*  --     Estimated Creatinine Clearance: 32.3 mL/min (A) (by C-G formula based on SCr of 2.7 mg/dL (H)).   Medical History: Past Medical History:  Diagnosis Date  . Arthritis   . Diabetes (HCC)   . Diabetes mellitus Jan. 1999  . High cholesterol   . Hypertension   . Myocardial infarct (HCC)   . Peripheral vascular disease (HCC)   . Seasonal allergies   . Urine incontinence     Medications:  Scheduled:  . [START ON 08/24/2017] aspirin EC  81 mg Oral Daily  . atorvastatin  80 mg Oral q1800  . [START ON 08/24/2017] glimepiride  4 mg Oral BID  . sodium chloride flush  3 mL Intravenous Q12H   Infusions:  . sodium chloride    . sodium chloride    . heparin      Assessment: 65 yom came in as a code STEMI. He has a hx of CAD with BMS to RCA in 1999. LHC was done on 9/15 that showed multivessel disease with a balloon pump placed.  Goal of Therapy:  Heparin level 0.2-0.5 units/ml while balloon pump in place Monitor platelets by anticoagulation protocol: Yes   Plan:  No bolus ordered Start heparin infusion at 950 units/hr Check anti-Xa level in 6 hours and daily while on heparin Continue to monitor H&H and platelets  Girard Cooter, PharmD Clinical Pharmacist  Phone:  [phone] 08/23/2017,7:18 PM

## 2017-08-23 NOTE — Progress Notes (Signed)
  Echocardiogram 2D Echocardiogram has been performed.  Delcie Roch 08/23/2017, 10:38 PM

## 2017-08-23 NOTE — Progress Notes (Signed)
Patient has not voided since arrival at 1830. Bladder scan completed, resulted 301. Patient denies urge to void or pelvic discomfort at this time. Will monitor. Per Dr. Hildred Alamin (Cards fellow on call) if bladder scan is greater than 500 and patient unable to void, may insert Foley catheter.

## 2017-08-24 ENCOUNTER — Inpatient Hospital Stay (HOSPITAL_COMMUNITY): Payer: PPO

## 2017-08-24 ENCOUNTER — Encounter (HOSPITAL_COMMUNITY): Payer: Self-pay | Admitting: Anesthesiology

## 2017-08-24 DIAGNOSIS — I2102 ST elevation (STEMI) myocardial infarction involving left anterior descending coronary artery: Secondary | ICD-10-CM

## 2017-08-24 LAB — CBC
HCT: 42.2 % (ref 39.0–52.0)
HEMATOCRIT: 41.8 % (ref 39.0–52.0)
HEMOGLOBIN: 13.8 g/dL (ref 13.0–17.0)
HEMOGLOBIN: 13.7 g/dL (ref 13.0–17.0)
MCH: 29.8 pg (ref 26.0–34.0)
MCH: 29.9 pg (ref 26.0–34.0)
MCHC: 32.7 g/dL (ref 30.0–36.0)
MCHC: 32.8 g/dL (ref 30.0–36.0)
MCV: 91.1 fL (ref 78.0–100.0)
MCV: 91.3 fL (ref 78.0–100.0)
Platelets: 176 10*3/uL (ref 150–400)
Platelets: 171 10*3/uL (ref 150–400)
RBC: 4.63 MIL/uL (ref 4.22–5.81)
RBC: 4.58 MIL/uL (ref 4.22–5.81)
RDW: 14.3 % (ref 11.5–15.5)
RDW: 14.2 % (ref 11.5–15.5)
WBC: 13.6 10*3/uL — AB (ref 4.0–10.5)
WBC: 13.3 10*3/uL — ABNORMAL HIGH (ref 4.0–10.5)

## 2017-08-24 LAB — ECHOCARDIOGRAM COMPLETE
CHL CUP MV DEC (S): 269
CHL CUP RV SYS PRESS: 24 mmHg
EWDT: 269 ms
FS: 21 % — AB (ref 28–44)
Height: 71 in
IV/PV OW: 0.93
LA ID, A-P, ES: 42 mm
LA diam end sys: 42 mm
LA vol A4C: 47.7 ml
LA vol: 51.7 mL
LADIAMINDEX: 1.95 cm/m2
LAVOLIN: 24.1 mL/m2
LDCA: 2.84 cm2
LV PW d: 14.2 mm — AB (ref 0.6–1.1)
LVOTD: 19 mm
MV Peak grad: 3 mmHg
MV pk A vel: 29.2 m/s
MV pk E vel: 88.3 m/s
Reg peak vel: 189 cm/s
TR max vel: 189 cm/s
Weight: 3200 oz

## 2017-08-24 LAB — URINALYSIS, ROUTINE W REFLEX MICROSCOPIC
Bilirubin Urine: NEGATIVE
Glucose, UA: NEGATIVE mg/dL
Ketones, ur: NEGATIVE mg/dL
Leukocytes, UA: NEGATIVE
Nitrite: NEGATIVE
Protein, ur: NEGATIVE mg/dL
Specific Gravity, Urine: 1.035 — ABNORMAL HIGH (ref 1.005–1.030)
pH: 5 (ref 5.0–8.0)

## 2017-08-24 LAB — LIPID PANEL
Cholesterol: 126 mg/dL (ref 0–200)
HDL: 31 mg/dL — AB (ref >40)
LDL CALC: 47 mg/dL (ref 0–99)
TRIGLYCERIDES: 240 mg/dL — AB (ref <150)
Total CHOL/HDL Ratio: 4.1 RATIO
VLDL: 48 mg/dL — ABNORMAL HIGH (ref 0–40)

## 2017-08-24 LAB — GLUCOSE, CAPILLARY
GLUCOSE-CAPILLARY: 180 mg/dL — AB (ref 65–99)
Glucose-Capillary: 179 mg/dL — ABNORMAL HIGH (ref 65–99)
Glucose-Capillary: 176 mg/dL — ABNORMAL HIGH (ref 65–99)

## 2017-08-24 LAB — TROPONIN I
Troponin I: 3.76 ng/mL (ref <0.03)
Troponin I: 5 ng/mL (ref <0.03)

## 2017-08-24 LAB — COOXEMETRY PANEL
CARBOXYHEMOGLOBIN: 1.4 % (ref 0.5–1.5)
Methemoglobin: 1.2 % (ref 0.0–1.5)
O2 SAT: 59.2 %
TOTAL HEMOGLOBIN: 13.7 g/dL (ref 12.0–16.0)

## 2017-08-24 LAB — BASIC METABOLIC PANEL
ANION GAP: 9 (ref 5–15)
BUN: 21 mg/dL — ABNORMAL HIGH (ref 6–20)
CO2: 24 mmol/L (ref 22–32)
Calcium: 8.5 mg/dL — ABNORMAL LOW (ref 8.9–10.3)
Chloride: 101 mmol/L (ref 101–111)
Creatinine, Ser: 2.69 mg/dL — ABNORMAL HIGH (ref 0.61–1.24)
GFR, EST AFRICAN AMERICAN: 27 mL/min — AB (ref >60)
GFR, EST NON AFRICAN AMERICAN: 24 mL/min — AB (ref >60)
GLUCOSE: 173 mg/dL — AB (ref 65–99)
POTASSIUM: 4.3 mmol/L (ref 3.5–5.1)
Sodium: 134 mmol/L — ABNORMAL LOW (ref 135–145)

## 2017-08-24 LAB — POCT I-STAT 3, ART BLOOD GAS (G3+)
ACID-BASE DEFICIT: 2 mmol/L (ref 0.0–2.0)
Bicarbonate: 24.1 mmol/L (ref 20.0–28.0)
O2 SAT: 92 %
PH ART: 7.349 — AB (ref 7.350–7.450)
TCO2: 25 mmol/L (ref 22–32)
pCO2 arterial: 43.7 mmHg (ref 32.0–48.0)
pO2, Arterial: 68 mmHg — ABNORMAL LOW (ref 83.0–108.0)

## 2017-08-24 LAB — HEPATIC FUNCTION PANEL
ALK PHOS: 54 U/L (ref 38–126)
ALT: 26 U/L (ref 17–63)
AST: 74 U/L — ABNORMAL HIGH (ref 15–41)
Albumin: 3.4 g/dL — ABNORMAL LOW (ref 3.5–5.0)
BILIRUBIN DIRECT: 0.1 mg/dL (ref 0.1–0.5)
BILIRUBIN INDIRECT: 0.2 mg/dL — AB (ref 0.3–0.9)
BILIRUBIN TOTAL: 0.3 mg/dL (ref 0.3–1.2)
Total Protein: 6.3 g/dL — ABNORMAL LOW (ref 6.5–8.1)

## 2017-08-24 LAB — LACTIC ACID, PLASMA
LACTIC ACID, VENOUS: 2.1 mmol/L — AB (ref 0.5–1.9)
LACTIC ACID, VENOUS: 2.4 mmol/L — AB (ref 0.5–1.9)

## 2017-08-24 LAB — SURGICAL PCR SCREEN
MRSA, PCR: NEGATIVE
Staphylococcus aureus: POSITIVE — AB

## 2017-08-24 LAB — AMYLASE: Amylase: 85 U/L (ref 28–100)

## 2017-08-24 LAB — HEPARIN LEVEL (UNFRACTIONATED)
HEPARIN UNFRACTIONATED: 0.31 [IU]/mL (ref 0.30–0.70)
HEPARIN UNFRACTIONATED: 0.42 [IU]/mL (ref 0.30–0.70)
Heparin Unfractionated: 0.21 IU/mL — ABNORMAL LOW (ref 0.30–0.70)

## 2017-08-24 LAB — PROTIME-INR
INR: 1.15
PROTHROMBIN TIME: 14.6 s (ref 11.4–15.2)

## 2017-08-24 LAB — HIV ANTIBODY (ROUTINE TESTING W REFLEX): HIV Screen 4th Generation wRfx: NONREACTIVE

## 2017-08-24 MED ORDER — SODIUM CHLORIDE 0.9% FLUSH
10.0000 mL | Freq: Two times a day (BID) | INTRAVENOUS | Status: DC
  Administered 2017-08-24 – 2017-08-26 (×3): 10 mL

## 2017-08-24 MED ORDER — NOREPINEPHRINE BITARTRATE 1 MG/ML IV SOLN
0.0000 ug/min | INTRAVENOUS | Status: DC
  Administered 2017-08-24: 2 ug/min via INTRAVENOUS
  Filled 2017-08-24 (×2): qty 4

## 2017-08-24 MED ORDER — INFLUENZA VAC SPLIT QUAD 0.5 ML IM SUSY: 0.5000 mL | PREFILLED_SYRINGE | INTRAMUSCULAR | Status: DC | PRN

## 2017-08-24 MED ORDER — NICOTINE 14 MG/24HR TD PT24: 14.0000 mg | MEDICATED_PATCH | TRANSDERMAL | Status: DC

## 2017-08-24 MED ORDER — BRIMONIDINE TARTRATE 0.15 % OP SOLN
1.0000 [drp] | Freq: Two times a day (BID) | OPHTHALMIC | Status: DC
  Administered 2017-08-24 – 2017-08-27 (×7): 1 [drp] via OPHTHALMIC
  Filled 2017-08-24: qty 5

## 2017-08-24 MED ORDER — SODIUM CHLORIDE 0.9% FLUSH: 10.0000 mL | INTRAVENOUS | Status: DC | PRN

## 2017-08-24 MED ORDER — NICOTINE 21 MG/24HR TD PT24
21.0000 mg | MEDICATED_PATCH | TRANSDERMAL | Status: DC
  Administered 2017-08-24 – 2017-08-27 (×4): 21 mg via TRANSDERMAL
  Filled 2017-08-24 (×4): qty 1

## 2017-08-24 MED ORDER — LORAZEPAM 0.5 MG PO TABS
0.5000 mg | ORAL_TABLET | Freq: Three times a day (TID) | ORAL | Status: DC | PRN
  Administered 2017-08-24 – 2017-08-27 (×4): 0.5 mg via ORAL
  Filled 2017-08-24 (×4): qty 1

## 2017-08-24 MED ORDER — MUPIROCIN 2 % EX OINT
1.0000 | TOPICAL_OINTMENT | Freq: Two times a day (BID) | CUTANEOUS | Status: DC
Start: 2017-08-24 — End: 2017-08-28
  Administered 2017-08-24 – 2017-08-27 (×8): 1 via NASAL
  Filled 2017-08-24: qty 22

## 2017-08-24 MED ORDER — INSULIN ASPART 100 UNIT/ML ~~LOC~~ SOLN
0.0000 [IU] | Freq: Three times a day (TID) | SUBCUTANEOUS | Status: DC
  Administered 2017-08-24 – 2017-08-25 (×3): 4 [IU] via SUBCUTANEOUS
  Administered 2017-08-25 (×2): 3 [IU] via SUBCUTANEOUS
  Administered 2017-08-26 (×2): 4 [IU] via SUBCUTANEOUS
  Administered 2017-08-26 – 2017-08-27 (×2): 7 [IU] via SUBCUTANEOUS
  Administered 2017-08-27 (×2): 4 [IU] via SUBCUTANEOUS

## 2017-08-24 MED ORDER — ALUM & MAG HYDROXIDE-SIMETH 200-200-20 MG/5ML PO SUSP
30.0000 mL | Freq: Four times a day (QID) | ORAL | Status: DC | PRN
  Administered 2017-08-25: 30 mL via ORAL
  Filled 2017-08-24: qty 30

## 2017-08-24 MED ORDER — INSULIN ASPART 100 UNIT/ML ~~LOC~~ SOLN
0.0000 [IU] | Freq: Every day | SUBCUTANEOUS | Status: DC
  Administered 2017-08-27: 2 [IU] via SUBCUTANEOUS

## 2017-08-24 MED ORDER — SODIUM CHLORIDE 0.9% FLUSH: 10.0000 mL | Freq: Two times a day (BID) | INTRAVENOUS | Status: DC

## 2017-08-24 MED ORDER — MILRINONE LACTATE IN DEXTROSE 20-5 MG/100ML-% IV SOLN
0.1250 ug/kg/min | INTRAVENOUS | Status: DC
  Administered 2017-08-24 – 2017-08-25 (×2): 0.125 ug/kg/min via INTRAVENOUS
  Filled 2017-08-24 (×2): qty 100

## 2017-08-24 MED ORDER — PANTOPRAZOLE SODIUM 40 MG IV SOLR
40.0000 mg | Freq: Every day | INTRAVENOUS | Status: DC
  Administered 2017-08-24: 40 mg via INTRAVENOUS
  Filled 2017-08-24: qty 40

## 2017-08-24 MED ORDER — GLUCERNA SHAKE PO LIQD
237.0000 mL | Freq: Three times a day (TID) | ORAL | Status: DC
  Administered 2017-08-24: 237 mL via ORAL

## 2017-08-24 MED ORDER — CHLORHEXIDINE GLUCONATE CLOTH 2 % EX PADS
6.0000 | MEDICATED_PAD | Freq: Every day | CUTANEOUS | Status: DC
  Administered 2017-08-24 – 2017-08-27 (×4): 6 via TOPICAL

## 2017-08-24 NOTE — Consult Note (Signed)
[address]       [place] [address]             [phone]        Clinton Harrington [facility] Medical Record #[id number] Date of Birth: 11/30/1951  Referring: Erlene Quan, MD Primary Care: Johny Blamer, MD  Chief Complaint:    Chief Complaint  Patient presents with  . Code STEMI  Patient examined, coronary angiograms and 2-D echocardiogram images personally reviewed. Patient discussed with his cardiologist Dr. Allyson Sabal for coordination of care.  History of Present Illness:     66 year old obese Caucasian diabetic smoker with history of CAD status post PCI to the RCA 20 years ago admitted to the ED with chest pain, shortness of breath, nausea and diaphoresis, and hypotension. The patient had altered mental status with blood pressure 60 systolic. The patient had ST segment changes on EKG. He is treated with saline bolus and epinephrine which improved his blood pressure. He had persistent chest pain and underwent emergency catheterization. This demonstrated chronic occlusion of the dominant RCA with faint collateralization from the left side. The LAD had a proximal 90% stenosis and the circumflex had a tubular 75% to 80% stenosis. Because of the patient's elevated creatinine 2.8 a venogram was not performed. LVEDP was 15 mmHg. The patient was in a sinus rhythm. A balloon pump was placed in the cath lab via the right femoral artery which improved his systolic blood pressure.  Echocardiogram shows LVH with mild LV dysfunction. There is moderate to severe RV dysfunction and RV dilatation with mild TR. No significant MR  The patient has been on Plavix since his PCI 20 years ago. He was last seen by cardiologist Dr. Herbie Baltimore in 2017 prior to undergoing balloon angioplasty of a left saphenous vein femoropopliteal bypass with a stenosis at the distal anastomosis.  The patient is currently in the ICU with balloon pump was adequate systolic blood pressure. He has a slow  junctional rhythm 50 bpm. He denies chest pain. Chest x-ray shows mild interstitial edema.  The patient's cardiologist feels that the best treatment would be surgical coronary revascularization. The RCA has a very questionable distal target vessel. The LAD diagonal and OM branches appear to be graftable vessels. The patient does not have saphenous vein in his left leg because of the previous femoral-popliteal bypass-she will need vein mapping of his right leg to document presence of adequate conduit prior to CABG.  The patient has a mild metabolic acidosis which is improving. His cardiac enzymes are still rising. His creatinine is minimally improved today. A P2 Y 12 platelet function assay is pending. Current Activity/ Functional Status: The patient is retired. He lives with his disabled mother who has dementia. No other family. The patient is his mother's primary caregiver and the mother was placed in a skilled nursing facility urgently after the patient was hospitalized. The patient's functional status was fairly good prior to his sudden illness-STEMI.   Zubrod Score: At the time of surgery this patient's most appropriate activity status/level should be described as: []     0    Normal activity, no symptoms []     1    Restricted in physical strenuous activity but ambulatory, able to do out light work []     2    Ambulatory and capable of self care, unable to do work activities, up and about  more than 50%  Of the time                            []     3    Only limited self care, in bed greater than 50% of waking hours [x]     4    Completely disabled, no self care, confined to bed or chair []     5    Moribund  Past Medical History:  Diagnosis Date  . Arthritis   . Diabetes (HCC)   . Diabetes mellitus Jan. 1999  . High cholesterol   . Hypertension   . Myocardial infarct (HCC)   . Peripheral vascular disease (HCC)   . Seasonal allergies   . Urine incontinence     Past  Surgical History:  Procedure Laterality Date  . CORONARY STENT PLACEMENT  Jan.  1999  . FEMORAL-POPLITEAL BYPASS GRAFT Left 01/03/2016   Procedure: LEFT COMMON FEMORAL-BELOW KNEE POPLITEAL ARTERY BYPASS GRAFT USING NON REVERSED TRANSLOCATED SAPHENOUS VEIN GRAFT;  Surgeon: Pryor Ochoa, MD;  Location: [facility] OR;  Service: Vascular;  Laterality: Left;  . INTRAOPERATIVE ARTERIOGRAM Left 01/03/2016   Procedure: INTRA OPERATIVE ARTERIOGRAM LEFT LEG;  Surgeon: Pryor Ochoa, MD;  Location: [facility] OR;  Service: Vascular;  Laterality: Left;  . PERIPHERAL VASCULAR CATHETERIZATION N/A 01/01/2016   Procedure: Abdominal Aortogram w/Lower Extremity;  Surgeon: Fransisco Hertz, MD;  Location: [facility] INVASIVE CV LAB;  Service: Cardiovascular;  Laterality: N/A;  . PERIPHERAL VASCULAR CATHETERIZATION Left 01/01/2016   Procedure: Peripheral Vascular Balloon Angioplasty;  Surgeon: Fransisco Hertz, MD;  Location: [facility] INVASIVE CV LAB;  Service: Cardiovascular;  Laterality: Left;  Popliteal artery  . PERIPHERAL VASCULAR CATHETERIZATION N/A 12/04/2016   Procedure: Abdominal Aortogram w/Lower Extremity;  Surgeon: Maeola Harman, MD;  Location: [facility] INVASIVE CV LAB;  Service: Cardiovascular;  Laterality: N/A;  . PERIPHERAL VASCULAR CATHETERIZATION Left 12/04/2016   Procedure: Peripheral Vascular Balloon Angioplasty;  Surgeon: Maeola Harman, MD;  Location: [facility] INVASIVE CV LAB;  Service: Cardiovascular;  Laterality: Left;  Fem_Pop bypass    History  Smoking Status  . Current Every Day Smoker  . Packs/day: 0.50  . Years: 45.00  . Types: Cigarettes  Smokeless Tobacco  . Never Used    Comment: 1/2 pk per day    History  Alcohol Use No    Social History   Social History  . Marital status: Divorced    Spouse name: N/A  . Number of children: N/A  . Years of education: N/A   Occupational History  . Not on file.   Social History Main Topics  . Smoking status: Current Every Day Smoker    Packs/day: 0.50     Years: 45.00    Types: Cigarettes  . Smokeless tobacco: Never Used     Comment: 1/2 pk per day  . Alcohol use No  . Drug use: No  . Sexual activity: Not on file   Other Topics Concern  . Not on file   Social History Narrative   Disability since 2015.  Previously worked at [employer] for 10 years.    He lives at home with mother with dementia.   He has one daughter.     Allergies  Allergen Reactions  . Aspirin Other (See Comments)    Stomach upset  . Latex Rash  . Oxycodone-Acetaminophen Itching    Current Facility-Administered Medications  Medication Dose Route Frequency Provider Last Rate Last Dose  .  0.9 %  sodium chloride infusion  250 mL Intravenous PRN Runell Gess, MD      . acetaminophen (TYLENOL) tablet 650 mg  650 mg Oral Q4H PRN Runell Gess, MD   650 mg at 08/23/17 2222  . aspirin EC tablet 81 mg  81 mg Oral Daily Devineni, Harish C, MD   81 mg at 08/24/17 1114  . atorvastatin (LIPITOR) tablet 80 mg  80 mg Oral q1800 Devineni, Harish C, MD      . brimonidine (ALPHAGAN) 0.15 % ophthalmic solution 1 drop  1 drop Right Eye BID Duke Salvia, MD      . feeding supplement (GLUCERNA SHAKE) (GLUCERNA SHAKE) liquid 237 mL  237 mL Oral TID WC Donata Clay, Theron Arista, MD      . heparin ADULT infusion 100 units/mL (25000 units/260mL sodium chloride 0.45%)  950 Units/hr Intravenous Continuous Runell Gess, MD 9.5 mL/hr at 08/24/17 0800 950 Units/hr at 08/24/17 0800  . Influenza vac split quadrivalent PF (FLUARIX) injection 0.5 mL  0.5 mL Intramuscular Prior to discharge Runell Gess, MD      . insulin aspart (novoLOG) injection 0-20 Units  0-20 Units Subcutaneous TID WC Kerin Perna, MD   4 Units at 08/24/17 1140  . insulin aspart (novoLOG) injection 0-5 Units  0-5 Units Subcutaneous QHS Donata Clay, Theron Arista, MD      . MEDLINE mouth rinse  15 mL Mouth Rinse BID Runell Gess, MD   15 mL at 08/23/17 2315  . nitroGLYCERIN (NITROSTAT) SL tablet 0.4 mg  0.4 mg  Sublingual Q5 Min x 3 PRN Devineni, Harish C, MD      . norepinephrine (LEVOPHED) 4 mg in dextrose 5 % 250 mL (0.016 mg/mL) infusion  0-40 mcg/min Intravenous Titrated Duke Salvia, MD 15 mL/hr at 08/24/17 1100 4 mcg/min at 08/24/17 1100  . ondansetron (ZOFRAN) injection 4 mg  4 mg Intravenous Q6H PRN Runell Gess, MD   4 mg at 08/24/17 0849  . sodium chloride flush (NS) 0.9 % injection 3 mL  3 mL Intravenous Q12H Runell Gess, MD   3 mL at 08/24/17 1115  . sodium chloride flush (NS) 0.9 % injection 3 mL  3 mL Intravenous PRN Runell Gess, MD        Prescriptions Prior to Admission  Medication Sig Dispense Refill Last Dose  . acetaminophen (TYLENOL) 650 MG CR tablet Take 1,300 mg by mouth 2 (two) times daily.   Past Week at Unknown time  . amLODipine (NORVASC) 5 MG tablet Take 5 mg by mouth daily.  4 Past Week at Unknown time  . brimonidine (ALPHAGAN) 0.15 % ophthalmic solution Place 1 drop into the right eye 2 (two) times daily.    Past Week at Unknown time  . carvedilol (COREG) 3.125 MG tablet Take 1 tablet (3.125 mg total) by mouth 2 (two) times daily with a meal. 60 tablet 3 Past Week at Unknown time  . clopidogrel (PLAVIX) 75 MG tablet Take 1 tablet (75 mg total) by mouth daily. 90 tablet 1 08/22/2017 at 1900  . gabapentin (NEURONTIN) 300 MG capsule Take 900 mg by mouth 3 (three) times daily.   0 Past Week at Unknown time  . glimepiride (AMARYL) 4 MG tablet Take 4 mg by mouth 2 (two) times daily.   Past Week at Unknown time  . lisinopril (PRINIVIL,ZESTRIL) 40 MG tablet Take 1 tablet (40 mg total) by mouth daily. 90 tablet 3 Past Week  at Unknown time  . metFORMIN (GLUCOPHAGE) 1000 MG tablet Take 1 tablet (1,000 mg total) by mouth 2 (two) times daily with a meal. 180 tablet 3 Past Week at Unknown time  . simvastatin (ZOCOR) 5 MG tablet Take 5 mg by mouth daily.   08/23/2017 at Unknown time  . traMADol (ULTRAM) 50 MG tablet Take 1 tablet (50 mg total) by mouth every 6 (six) hours  as needed for moderate pain.   Past Month at Unknown time  . traZODone (DESYREL) 100 MG tablet Take 100 mg by mouth at bedtime.   Past Week at Unknown time  . trimethoprim-polymyxin b (POLYTRIM) ophthalmic solution Place 1 drop into the right eye See admin instructions. Pt instills two drops into right eye four times daily For two days after monthly eye injections   Past Week at Unknown time    Family History  Problem Relation Age of Onset  . Arthritis Mother   . Hypertension Mother   . Heart disease Maternal Grandmother   . Mental illness Maternal Grandmother      Review of Systems:  The patient tolerated general anesthesia for his left femoral-popliteal bypass procedure He denies significant bleeding associated with his chronic Plavix use No history of thoracic trauma, rib fracture, pneumothorax No symptoms of orthopnea or ankle edema     Cardiac Review of Systems: Y or N  Chest Pain [   y ]  Resting SOB [ y  ] Exertional SOB  [ y ]  31 [ y ]   Pedal Edema [  n ]    Palpitations [ y ] Syncope  [ n ]   Presyncope [place]   ]  General Review of Systems: [Y] = yes [  ]=no Constitional: recent weight change [  ]; anorexia [  ]; fatigue [  ]; nausea [  ]; night sweats [  ]; fever [  ]; or chills [  ]                                                               Dental: poor dentition[  ]; Last Dentist visit: Edentulous  Eye : blurred vision [  ]; diplopia [   ]; vision changes [  ];  Amaurosis fugax[  ]; Resp: cough [  ];  wheezing[  ];  hemoptysis[  ]; shortness of breath[y  ]; paroxysmal nocturnal dyspnea[  ]; dyspnea on exertion[  ]; or orthopnea[  ];  GI:  gallstones[  ], vomiting[  ];  dysphagia[  ]; melena[  ];  hematochezia [  ]; heartburn[y  ];   Hx of  Colonoscopy[  ]; GU: kidney stones [  ]; hematuria[  ];   dysuria [  ];  nocturia[  ];  history of     obstruction [  ]; urinary frequency [  ]             Skin: rash, swelling[  ];, hair loss[  ];  peripheral edema[  ];  or  itching[  ]; Musculosketetal: myalgias[  ];  joint swelling[  ];  joint erythema[  ];  joint pain[  ];  back pain[  ];  Heme/Lymph: bruising[  ];  bleeding[  ];  anemia[  ];  Neuro: TIA[  ];  headaches[  ];  stroke[  ];  vertigo[  ];  seizures[  ];   paresthesias[  ];  difficulty walking[  ];  Psych:depression[  ]; anxiety[  ];  Endocrine: diabetes[y  ];  thyroid dysfunction[  ];  Immunizations: Flu [  ]; Pneumococcal[  ];  Other:  Physical Exam: BP 113/79 (BP Location: Right Arm)   Pulse (!) 56   Temp 98.3 F (36.8 C) (Oral)   Resp (!) 23   Ht  (1.803 m)   Wt 200 lb (90.7 kg)   SpO2 95%   BMI 27.89 kg/m        Physical Exam  General: Middle-aged Caucasian male obese lying in CCU supine with balloon pump no acute distress alert and appropriate  HEENT: Normocephalic pupils equal , dentition edentulous Neck: Supple without JVD, adenopathy, or bruit Chest: Clear to auscultation, symmetrical breath sounds, no rhonchi, no tenderness             or deformity Cardiovascular: Regular rate and rhythm, no murmur, no gallop, peripheral pulses             palpable in all extremities Abdomen:  Soft, nontender, no palpable mass or organomegaly Extremities: Warm, well-perfused, no clubbing cyanosis edema or tenderness,              no venous stasis changes of the legs large scar of left leg from femoropopliteal bypass Rectal/GU: Deferred Neuro: Grossly non--focal and symmetrical throughout Skin: Clean and dry without rash or ulceration   Diagnostic Studies & Laboratory data:     Recent Radiology Findings:   Dg Chest Port 1 View  Result Date: 08/24/2017 CLINICAL DATA:  Myocardial infarction. EXAM: PORTABLE CHEST 1 VIEW COMPARISON:  August 23, 2017 FINDINGS: Cardiomegaly. The hila and mediastinum are unchanged. Mild edema, not seen previously. No other acute abnormalities. IMPRESSION: Cardiomegaly.  Interval development of mild edema. Electronically Signed   By: Gerome Sam  III M.D   On: 08/24/2017 08:56   Dg Chest Port 1 View  Result Date: 08/23/2017 CLINICAL DATA:  Acute chest pain for 2 days. EXAM: PORTABLE CHEST 1 VIEW COMPARISON:  01/02/2016 FINDINGS: Cardiomegaly again identified. There is no evidence of focal airspace disease, pulmonary edema, suspicious pulmonary nodule/mass, pleural effusion, or pneumothorax. No acute bony abnormalities are identified. IMPRESSION: Cardiomegaly without evidence of acute cardiopulmonary disease. Electronically Signed   By: Harmon Pier M.D.   On: 08/23/2017 17:45     I have independently reviewed the above radiologic studies.  Recent Lab Findings: Lab Results  Component Value Date   WBC 13.3 (H) 08/24/2017   HGB 13.7 08/24/2017   HCT 41.8 08/24/2017   PLT 171 08/24/2017   GLUCOSE 173 (H) 08/24/2017   CHOL 126 08/24/2017   TRIG 240 (H) 08/24/2017   HDL 31 (L) 08/24/2017   LDLDIRECT 128.6 10/30/2012   LDLCALC 47 08/24/2017   ALT 26 08/24/2017   AST 74 (H) 08/24/2017   NA 134 (L) 08/24/2017   K 4.3 08/24/2017   CL 101 08/24/2017   CREATININE 2.69 (H) 08/24/2017   BUN 21 (H) 08/24/2017   CO2 24 08/24/2017   TSH 1.166 08/23/2017   INR 1.15 08/24/2017   HGBA1C 6.0 (H) 08/23/2017      Assessment / Plan:     Acute MI with cardiogenic shock probably due to RV infarct with poor LV filling, significant RV dysfunction and hypotension.  Acute renal failure related to shock and heavy use of Advil for chest pain  the preceding 2 days prior to his MI  Diabetes mellitus Active smoking Peripheral vascular disease status post left femoropopliteal bypass  Patient will be a potential candidate for CABG after acute renal failure improves, after RV function improves, and after vein mapping demonstrates adequate saphenous vein conduit available in the right leg. P2 Y 12 assay pending for Plavix washout Anticipate surgery later this week-patient will need balloon pump until surgery. Waiting adequate time for RV recovery is  important to avoid the need for postop right ventricular assist device.   \ @ 08/24/2017 12:45 PM

## 2017-08-24 NOTE — Progress Notes (Signed)
IABP repositioned at bedside by Dr. Donata Clay after xray review. Site stable and adequate waveform post repositioning.

## 2017-08-24 NOTE — Progress Notes (Signed)
ANTICOAGULATION CONSULT NOTE - Follow Up Consult  Pharmacy Consult for Heparin Indication: severe 3v CAD; IABP  Allergies  Allergen Reactions  . Aspirin Other (See Comments)    Stomach upset  . Latex Rash  . Oxycodone-Acetaminophen Itching    Patient Measurements: Height:  (180.3 cm) Weight: 200 lb (90.7 kg) IBW/kg (Calculated) : 75.3   Vital Signs: Temp: 98.8 F (37.1 C) (09/15 2300) Temp Source: Oral (09/15 2300) BP: 82/53 (09/16 0100) Pulse Rate: 54 (09/16 0100)  Labs:  Recent Labs  08/23/17 1724 08/23/17 1733 08/23/17 1927 08/24/17 0027  HGB 13.2 13.6  --   --   HCT 40.5 40.0  --   --   PLT 183  --   --   --   APTT 28  --   --   --   LABPROT 14.3  --   --   --   INR 1.11  --   --   --   HEPARINUNFRC  --   --   --  0.42  CREATININE 2.82* 2.70*  --   --   TROPONINI 1.95*  --  2.63*  --     Estimated Creatinine Clearance: 32.3 mL/min (A) (by C-G formula based on SCr of 2.7 mg/dL (H)).   Medications:  Heparin @ 950 units/hr  Assessment: 65yom presented as a code STEMI, s/p cath found to have severe 3 vessel CAD. IABP was placed and he was started on heparin pending CABG next week. Initial heparin level is therapeutic at 0.42.   Goal of Therapy:  Heparin level 0.2-0.5 units/ml Monitor platelets by anticoagulation protocol: Yes   Plan:  1) Continue heparin at 950 units/hr 2) Follow up daily heparin level and CBC  Fredrik Rigger 08/24/2017,1:25 AM

## 2017-08-24 NOTE — Progress Notes (Signed)
Peripherally Inserted Central Catheter/Midline Placement  The IV Nurse has discussed with the patient and/or persons authorized to consent for the patient, the purpose of this procedure and the potential benefits and risks involved with this procedure.  The benefits include less needle sticks, lab draws from the catheter, and the patient may be discharged home with the catheter. Risks include, but not limited to, infection, bleeding, blood clot (thrombus formation), and puncture of an artery; nerve damage and irregular heartbeat and possibility to perform a PICC exchange if needed/ordered by physician.  Alternatives to this procedure were also discussed.  Bard Power PICC patient education guide, fact sheet on infection prevention and patient information card has been provided to patient /or left at bedside.    PICC/Midline Placement Documentation  PICC Double Lumen 08/24/17 PICC Right Basilic 45 cm 0 cm (Active)  Indication for Insertion or Continuance of Line Vasoactive infusions 08/24/2017  1:00 PM  Exposed Catheter (cm) 0 cm 08/24/2017  1:00 PM  Site Assessment Clean;Dry;Intact 08/24/2017  1:00 PM  Lumen #1 Status Flushed;Saline locked;Blood return noted 08/24/2017  1:00 PM  Lumen #2 Status Flushed;Saline locked;Blood return noted 08/24/2017  1:00 PM  Dressing Type Transparent;Securing device 08/24/2017  1:00 PM  Dressing Status Clean;Dry;Intact;Antimicrobial disc in place 08/24/2017  1:00 PM  Dressing Change Due 08/31/17 08/24/2017  1:00 PM       Clinton Harrington 08/24/2017, 1:16 PM

## 2017-08-24 NOTE — Progress Notes (Signed)
Patient's augmented pressures in the 79-80's. He's mentation is good. He denies any further chest pain or SOB.  Will continue to monitor urine output.   Echo per my views on tech completing the echo showed some septal wall motion changes with a EF of 45% with thick ventricle, dilated RV and RA, mild TR. Given these findings, I requested ICU to help assist with placement of a central line in order to monitor CVP and obtain a mixed venous. They noted, they will do as soon as time permits.   Initial lactic acid was 2.6, will continue to trend.   Will initiate mild dose of levophed if pressures drop or if he develops symptoms or signs of end organ issues.   Will repeat EKG in the AM.   As mentioned, took plavix, but CT surgery to consult in AM.

## 2017-08-24 NOTE — Progress Notes (Signed)
ANTICOAGULATION CONSULT NOTE - Follow Up Consult  Pharmacy Consult for Heparin Indication: severe 3v CAD; IABP  Allergies  Allergen Reactions  . Aspirin Other (See Comments)    Stomach upset  . Latex Rash  . Oxycodone-Acetaminophen Itching    Patient Measurements: Height:  (180.3 cm) Weight: 200 lb (90.7 kg) IBW/kg (Calculated) : 75.3   Vital Signs: Temp: 98.3 F (36.8 C) (09/16 1200) Temp Source: Oral (09/16 1200) BP: 113/79 (09/16 1200) Pulse Rate: 56 (09/16 1200)  Labs:  Recent Labs  08/23/17 1724 08/23/17 1733 08/23/17 1927 08/24/17 0027 08/24/17 0327 08/24/17 0644 08/24/17 1033  HGB 13.2 13.6  --   --  13.8 13.7  --   HCT 40.5 40.0  --   --  42.2 41.8  --   PLT 183  --   --   --  176 171  --   APTT 28  --   --   --   --   --   --   LABPROT 14.3  --   --   --   --  14.6  --   INR 1.11  --   --   --   --  1.15  --   HEPARINUNFRC  --   --   --  0.42 0.31  --  0.21*  CREATININE 2.82* 2.70*  --   --  2.69*  --   --   TROPONINI 1.95*  --  2.63* 3.76*  --  5.00*  --     Estimated Creatinine Clearance: 32.4 mL/min (A) (by C-G formula based on SCr of 2.69 mg/dL (H)).   Medications:  . sodium chloride    . heparin 950 Units/hr (08/24/17 0800)  . norepinephrine (LEVOPHED) Adult infusion 4 mcg/min (08/24/17 1100)    Assessment: 65yom presented as a code STEMI, s/p cath found to have severe 3 vessel CAD. IABP was placed and he was started on heparin pending CABG next week. IABP remains at 1:1 today. Heparin levels at goal.  No overt bleeding or complications noted.  Goal of Therapy:  Heparin level 0.2-0.5 units/ml Monitor platelets by anticoagulation protocol: Yes   Plan:  1) Continue heparin at 950 units/hr 2) Follow up daily heparin level and CBC  Tad Moore, BCPS  Clinical Pharmacist Pager [phone]  08/24/2017 12:34 PM

## 2017-08-24 NOTE — Progress Notes (Signed)
Progress Note  Patient Name: Clinton Harrington Date of Encounter: 08/24/2017  Primary Cardiologist: JB  Primary Electrophysiologist:     Patient Profile     66 y.o. male admitted 9/15 with Anterior STEMI and shock and junctional rhythm  Known CAD with prior IMI and BMS 1999   Emergent cath >>Prox RCA to Mid RCA lesion, 100 %stenosed.   Ost LAD lesion, 90 %stenosed.    Mid LAD lesion, 75 %stenosed.   1st Diag lesion, 75 %stenosed. Rx IABP and surgery consulted -  plavix as outpt on hold  Echo EF 45% Cr 2.69 Junctional rhythm  Subjective   Nauseated all night No chest pain or shortness of breath Anxious lying flat in bed  Inpatient Medications    Scheduled Meds: . aspirin EC  81 mg Oral Daily  . atorvastatin  80 mg Oral q1800  . glimepiride  4 mg Oral BID  . Influenza vac split quadrivalent PF  0.5 mL Intramuscular Tomorrow-1000  . mouth rinse  15 mL Mouth Rinse BID  . sodium chloride flush  3 mL Intravenous Q12H   Continuous Infusions: . sodium chloride    . heparin 950 Units/hr (08/23/17 1937)   PRN Meds: sodium chloride, acetaminophen, nitroGLYCERIN, ondansetron (ZOFRAN) IV, sodium chloride flush   Vital Signs    Vitals:   08/24/17 0300 08/24/17 0400 08/24/17 0500 08/24/17 0600  BP: (!) 93/55 98/87 (!) 103/58 (!) 89/68  Pulse: (!) 56 (!) 40 (!) 56 (!) 57  Resp: (!) 21 (!) 22 (!) 23 (!) 24  Temp: 98.5 F (36.9 C)     TempSrc: Oral     SpO2: 96% 98% 95% 95%  Weight:      Height:        Intake/Output Summary (Last 24 hours) at 08/24/17 0814 Last data filed at 08/24/17 0640  Gross per 24 hour  Intake          1136.14 ml  Output              600 ml  Net           536.14 ml   Filed Weights   08/23/17 1754  Weight: 200 lb (90.7 kg)    Telemetry    Junctional - Personally Reviewed  ECG    Junctional 53 RBBB  - Personally Reviewed  RBBB old   Physical Exam   GEN: No acute distress.   Neck: JVD cant tell Cardiac: RRR, no  murmurs, rubs,  or gallops.  Respiratory: Clear to auscultation bilaterally. GI: Soft, nontender, non-distended  MS:  no  edema; No deformity. Neuro:  Nonfocal  Psych: Normal affect  Skin Warm and dry  DP pulse on R by doppler Labs    Chemistry Recent Labs Lab 08/23/17 1724 08/23/17 1733 08/23/17 2127 08/24/17 0327  NA 134* 137  --  134*  K 4.4 4.5  --  4.3  CL 100* 100*  --  101  CO2 23  --   --  24  GLUCOSE 169* 166*  --  173*  BUN 16 19  --  21*  CREATININE 2.82* 2.70*  --  2.69*  CALCIUM 8.7*  --   --  8.5*  PROT 6.5  --  6.5  --   ALBUMIN 3.5  --  3.4*  --   AST 42*  --  50*  --   ALT 22  --  23  --   ALKPHOS 55  --  55  --  BILITOT 0.5  --  0.3  --   GFRNONAA 22*  --   --  24*  GFRAA 26*  --   --  27*  ANIONGAP 11  --   --  9     Hematology Recent Labs Lab 08/23/17 1724 08/23/17 1733 08/24/17 0327 08/24/17 0644  WBC 14.0*  --  13.6* 13.3*  RBC 4.46  --  4.63 4.58  HGB 13.2 13.6 13.8 13.7  HCT 40.5 40.0 42.2 41.8  MCV 90.8  --  91.1 91.3  MCH 29.6  --  29.8 29.9  MCHC 32.6  --  32.7 32.8  RDW 14.1  --  14.3 14.2  PLT 183  --  176 171    Cardiac Enzymes Recent Labs Lab 08/23/17 1724 08/23/17 1927 08/24/17 0027  TROPONINI 1.95* 2.63* 3.76*   No results for input(s): TROPIPOC in the last 168 hours.   BNP Recent Labs Lab 08/23/17 1927  BNP 200.0*     DDimer No results for input(s): DDIMER in the last 168 hours.   Radiology    Dg Chest Port 1 View  Result Date: 08/23/2017 CLINICAL DATA:  Acute chest pain for 2 days. EXAM: PORTABLE CHEST 1 VIEW COMPARISON:  01/02/2016 FINDINGS: Cardiomegaly again identified. There is no evidence of focal airspace disease, pulmonary edema, suspicious pulmonary nodule/mass, pleural effusion, or pneumothorax. No acute bony abnormalities are identified. IMPRESSION: Cardiomegaly without evidence of acute cardiopulmonary disease. Electronically Signed   By: Harmon Pier M.D.   On: 08/23/2017 17:45    Cardiac Studies   As  above     Assessment & Plan    NSTEMI 3V CAD Shock- IABP Junctional rhythm Renal insufficiency g3-4 acute DM Nausea  Hypotensive and in junctional rhythm-- increasing sinus rate pharmacologically or procedurally would help CO ( and maybe renal function ) Discussed with Dr Norina Buzzard-- will begin norepi  Nausea of concern with peripheral vascular disease and IABP-- will check LFTs. Amylase Rx symptomatically and follow       Signed, Sherryl Manges, MD  08/24/2017, 8:14 AM

## 2017-08-25 ENCOUNTER — Other Ambulatory Visit: Payer: Self-pay | Admitting: *Deleted

## 2017-08-25 ENCOUNTER — Encounter (HOSPITAL_COMMUNITY): Payer: Self-pay | Admitting: Cardiovascular Disease

## 2017-08-25 ENCOUNTER — Inpatient Hospital Stay (HOSPITAL_COMMUNITY): Payer: PPO

## 2017-08-25 DIAGNOSIS — N170 Acute kidney failure with tubular necrosis: Secondary | ICD-10-CM

## 2017-08-25 DIAGNOSIS — I2510 Atherosclerotic heart disease of native coronary artery without angina pectoris: Secondary | ICD-10-CM

## 2017-08-25 DIAGNOSIS — I498 Other specified cardiac arrhythmias: Secondary | ICD-10-CM

## 2017-08-25 DIAGNOSIS — I2111 ST elevation (STEMI) myocardial infarction involving right coronary artery: Secondary | ICD-10-CM

## 2017-08-25 LAB — COMPREHENSIVE METABOLIC PANEL
ALT: 26 U/L (ref 17–63)
AST: 74 U/L — ABNORMAL HIGH (ref 15–41)
Albumin: 3.3 g/dL — ABNORMAL LOW (ref 3.5–5.0)
Alkaline Phosphatase: 53 U/L (ref 38–126)
Anion gap: 8 (ref 5–15)
BUN: 30 mg/dL — ABNORMAL HIGH (ref 6–20)
CO2: 26 mmol/L (ref 22–32)
Calcium: 8.7 mg/dL — ABNORMAL LOW (ref 8.9–10.3)
Chloride: 101 mmol/L (ref 101–111)
Creatinine, Ser: 1.63 mg/dL — ABNORMAL HIGH (ref 0.61–1.24)
GFR calc Af Amer: 50 mL/min — ABNORMAL LOW (ref >60)
GFR calc non Af Amer: 43 mL/min — ABNORMAL LOW (ref >60)
Glucose, Bld: 217 mg/dL — ABNORMAL HIGH (ref 65–99)
Potassium: 4 mmol/L (ref 3.5–5.1)
Sodium: 135 mmol/L (ref 135–145)
Total Bilirubin: 0.4 mg/dL (ref 0.3–1.2)
Total Protein: 6.6 g/dL (ref 6.5–8.1)

## 2017-08-25 LAB — GLUCOSE, CAPILLARY
Glucose-Capillary: 177 mg/dL — ABNORMAL HIGH (ref 65–99)
Glucose-Capillary: 146 mg/dL — ABNORMAL HIGH (ref 65–99)
Glucose-Capillary: 133 mg/dL — ABNORMAL HIGH (ref 65–99)
Glucose-Capillary: 204 mg/dL — ABNORMAL HIGH (ref 65–99)

## 2017-08-25 LAB — CBC
HEMATOCRIT: 40.4 % (ref 39.0–52.0)
Hemoglobin: 13.2 g/dL (ref 13.0–17.0)
MCH: 29.6 pg (ref 26.0–34.0)
MCHC: 32.7 g/dL (ref 30.0–36.0)
MCV: 90.6 fL (ref 78.0–100.0)
PLATELETS: 174 10*3/uL (ref 150–400)
RBC: 4.46 MIL/uL (ref 4.22–5.81)
RDW: 14.3 % (ref 11.5–15.5)
WBC: 13.4 10*3/uL — ABNORMAL HIGH (ref 4.0–10.5)

## 2017-08-25 LAB — SPIROMETRY WITH GRAPH
FEF 25-75 Post: 1.34 L/sec
FEF 25-75 Pre: 0.83 L/sec
FEF2575-%Change-Post: 61 %
FEF2575-%Pred-Post: 53 %
FEF2575-%Pred-Pre: 33 %
FEV1-%Change-Post: 9 %
FEV1-%Pred-Post: 36 %
FEV1-%Pred-Pre: 33 %
FEV1-Post: 1.14 L
FEV1-Pre: 1.04 L
FEV1FVC-%Change-Post: 5 %
FEV1FVC-%Pred-Pre: 102 %
FEV6-%Change-Post: 3 %
FEV6-%Pred-Post: 35 %
FEV6-%Pred-Pre: 34 %
FEV6-Post: 1.4 L
FEV6-Pre: 1.34 L
FEV6FVC-%Change-Post: 0 %
FEV6FVC-%Pred-Post: 105 %
FEV6FVC-%Pred-Pre: 105 %
FVC-%Change-Post: 3 %
FVC-%Pred-Post: 33 %
FVC-%Pred-Pre: 32 %
FVC-Post: 1.4 L
FVC-Pre: 1.34 L
Post FEV1/FVC ratio: 81 %
Post FEV6/FVC ratio: 100 %
Pre FEV1/FVC ratio: 77 %
Pre FEV6/FVC Ratio: 100 %

## 2017-08-25 LAB — COOXEMETRY PANEL
Carboxyhemoglobin: 0.9 % (ref 0.5–1.5)
Methemoglobin: 1.4 % (ref 0.0–1.5)
O2 Saturation: 73.2 %
Total hemoglobin: 12.8 g/dL (ref 12.0–16.0)

## 2017-08-25 LAB — PROTIME-INR
INR: 1.15
Prothrombin Time: 14.7 seconds (ref 11.4–15.2)

## 2017-08-25 LAB — HEPARIN LEVEL (UNFRACTIONATED): Heparin Unfractionated: 0.16 IU/mL — ABNORMAL LOW (ref 0.30–0.70)

## 2017-08-25 LAB — HEMOGLOBIN A1C
Hgb A1c MFr Bld: 5.9 % — ABNORMAL HIGH (ref 4.8–5.6)
Mean Plasma Glucose: 122.63 mg/dL

## 2017-08-25 LAB — PLATELET INHIBITION P2Y12: Platelet Function  P2Y12: 263 [PRU] (ref 194–418)

## 2017-08-25 LAB — TSH: TSH: 0.722 u[IU]/mL (ref 0.350–4.500)

## 2017-08-25 MED ORDER — METOCLOPRAMIDE HCL 5 MG/ML IJ SOLN
10.0000 mg | Freq: Four times a day (QID) | INTRAMUSCULAR | Status: DC
  Administered 2017-08-25 – 2017-08-28 (×11): 10 mg via INTRAVENOUS
  Filled 2017-08-25 (×11): qty 2

## 2017-08-25 MED ORDER — ONDANSETRON HCL 4 MG/2ML IJ SOLN
4.0000 mg | Freq: Once | INTRAMUSCULAR | Status: AC
  Administered 2017-08-25: 4 mg via INTRAVENOUS

## 2017-08-25 MED ORDER — MILRINONE LACTATE IN DEXTROSE 20-5 MG/100ML-% IV SOLN
0.2500 ug/kg/min | INTRAVENOUS | Status: DC
  Administered 2017-08-26 – 2017-08-28 (×4): 0.25 ug/kg/min via INTRAVENOUS
  Filled 2017-08-25 (×4): qty 100

## 2017-08-25 MED ORDER — PANTOPRAZOLE SODIUM 40 MG IV SOLR
40.0000 mg | Freq: Two times a day (BID) | INTRAVENOUS | Status: DC
  Administered 2017-08-25 – 2017-08-27 (×6): 40 mg via INTRAVENOUS
  Filled 2017-08-25 (×6): qty 40

## 2017-08-25 MED ORDER — ALBUTEROL SULFATE (2.5 MG/3ML) 0.083% IN NEBU
2.5000 mg | INHALATION_SOLUTION | Freq: Once | RESPIRATORY_TRACT | Status: AC
  Administered 2017-08-25: 2.5 mg via RESPIRATORY_TRACT

## 2017-08-25 MED ORDER — FUROSEMIDE 40 MG PO TABS
40.0000 mg | ORAL_TABLET | Freq: Every day | ORAL | Status: DC
  Administered 2017-08-25: 40 mg via ORAL
  Filled 2017-08-25: qty 1

## 2017-08-25 MED ORDER — HEPARIN (PORCINE) IN NACL 100-0.45 UNIT/ML-% IJ SOLN
1450.0000 [IU]/h | INTRAMUSCULAR | Status: DC
  Administered 2017-08-26 (×2): 1050 [IU]/h via INTRAVENOUS
  Administered 2017-08-27: 1350 [IU]/h via INTRAVENOUS
  Filled 2017-08-25 (×3): qty 250

## 2017-08-25 NOTE — Progress Notes (Signed)
[place] a Child psychotherapist who is working for his mom stopped by to get information about people that could possible help with his mom while he stays in the hospital, his mom is currently at 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 He has a niece however, he does not have her number here at the hospital.   Hermina Barters, RN

## 2017-08-25 NOTE — Plan of Care (Signed)
Problem: Nutrition: Goal: Adequate nutrition will be maintained Outcome: Not Progressing Pt. Has frequent N/V, belching, gas etc.

## 2017-08-25 NOTE — Progress Notes (Signed)
ANTICOAGULATION CONSULT NOTE - Follow Up Consult  Pharmacy Consult for Heparin Indication: severe 3v CAD; IABP  Allergies  Allergen Reactions  . Aspirin Other (See Comments)    Stomach upset  . Latex Rash  . Oxycodone-Acetaminophen Itching    Patient Measurements: Height:  (180.3 cm) Weight: 200 lb (90.7 kg) IBW/kg (Calculated) : 75.3   Vital Signs: Temp: 98.7 F (37.1 C) (09/17 0300) Temp Source: Oral (09/17 0300) BP: 134/60 (09/17 0500) Pulse Rate: 71 (09/17 0500)  Labs:  Recent Labs  08/23/17 1724 08/23/17 1733 08/23/17 1927  08/24/17 0027 08/24/17 0327 08/24/17 0644 08/24/17 1033 08/25/17 0422  HGB 13.2 13.6  --   --   --  13.8 13.7  --  13.2  HCT 40.5 40.0  --   --   --  42.2 41.8  --  40.4  PLT 183  --   --   --   --  176 171  --  174  APTT 28  --   --   --   --   --   --   --   --   LABPROT 14.3  --   --   --   --   --  14.6  --  14.7  INR 1.11  --   --   --   --   --  1.15  --  1.15  HEPARINUNFRC  --   --   --   < > 0.42 0.31  --  0.21* 0.16*  CREATININE 2.82* 2.70*  --   --   --  2.69*  --   --   --   TROPONINI 1.95*  --  2.63*  --  3.76*  --  5.00*  --   --   < > = values in this interval not displayed.  Estimated Creatinine Clearance: 32.4 mL/min (A) (by C-G formula based on SCr of 2.69 mg/dL (H)).   Medications:  Heparin @ 950 units/hr  Assessment: 65yom presented as a code STEMI, s/p cath found to have severe 3 vessel CAD. IABP was placed and he was started on heparin pending CABG this week. Heparin level is a little low at 0.16.   Goal of Therapy:  Heparin level 0.2-0.5 units/ml Monitor platelets by anticoagulation protocol: Yes   Plan:  1) Increase heparin to 1050 units/hr 2) Follow up daily heparin level and CBC  Louie Casa, Pharm D, BCPS   08/25/2017 5:39 AM

## 2017-08-25 NOTE — Care Management Note (Addendum)
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  Presents  With STEMI, 3 vessel disease, Cardiogenic shock, acute renal failure.  Plans is for CABG this week, conts on milrinone and IABP.     9/18 1547 NCM spoke with Gwenlyn Perking the CSW with [facility] place, stating that patient's mom will be able to stay there until he is discharged from the hospital,  This information was relayed to the patient and his RN.     9/21 1408 Letha Cape RN, BSN - post op CABG ,  On vent, on amio, precedex, dopamine and norepi.  9/25 1336 Letha Cape RN, BSN - conts to be on vent  POD 5 CABG with centrimag RVAD, afib, NG tube feeds, chest tubes to suction, pressors, amio, milrinone, lasix drip.       10/1 1442 Letha Cape RN, BSN - Removal of RVAD today, sternum closed, conts on vent, CVP  20, pressor/inotrope support, nitor oxide, cont to diurese, amio.            Action/Plan: NCM will cont to follow for dc needs.   Expected Discharge Date:                  Expected Discharge Plan:     In-House Referral:     Discharge planning Services  CM Consult  Post Acute Care Choice:    Choice offered to:     DME Arranged:    DME Agency:     HH Arranged:    HH Agency:     Status of Service:  In process, will continue to follow  If discussed at Long Length of Stay Meetings, dates discussed:    Additional Comments:  Leone Haven, RN 08/25/2017, 9:27 AM

## 2017-08-25 NOTE — Progress Notes (Signed)
Progress Note  Patient Name: Clinton Harrington Date of Encounter: 08/25/2017  Primary Cardiologist: Nanetta Batty MD  Subjective   Complains of N/V. Can't keep anything down. Denies chest pain or SOB. No leg pain.  Inpatient Medications    Scheduled Meds: . aspirin EC  81 mg Oral Daily  . atorvastatin  80 mg Oral q1800  . brimonidine  1 drop Right Eye BID  . Chlorhexidine Gluconate Cloth  6 each Topical Daily  . feeding supplement (GLUCERNA SHAKE)  237 mL Oral TID WC  . insulin aspart  0-20 Units Subcutaneous TID WC  . insulin aspart  0-5 Units Subcutaneous QHS  . mouth rinse  15 mL Mouth Rinse BID  . mupirocin ointment  1 application Nasal BID  . nicotine  21 mg Transdermal Q24H  . pantoprazole (PROTONIX) IV  40 mg Intravenous Daily  . sodium chloride flush  10-40 mL Intracatheter Q12H  . sodium chloride flush  3 mL Intravenous Q12H   Continuous Infusions: . sodium chloride 250 mL (08/25/17 0500)  . heparin 1,050 Units/hr (08/25/17 0545)  . milrinone 0.125 mcg/kg/min (08/24/17 1756)  . norepinephrine (LEVOPHED) Adult infusion Stopped (08/25/17 0400)   PRN Meds: sodium chloride, acetaminophen, alum & mag hydroxide-simeth, Influenza vac split quadrivalent PF, LORazepam, nitroGLYCERIN, ondansetron (ZOFRAN) IV, sodium chloride flush, sodium chloride flush   Vital Signs    Vitals:   08/25/17 0400 08/25/17 0500 08/25/17 0600 08/25/17 0700  BP: 115/79 134/60 134/76 101/66  Pulse: (!) 176 71 71 71  Resp: (!) 24 (!) 21 (!) 27 (!) 29  Temp:    98.5 F (36.9 C)  TempSrc:    Oral  SpO2: 96% 98% 99% 95%  Weight:   208 lb 1.8 oz (94.4 kg)   Height:        Intake/Output Summary (Last 24 hours) at 08/25/17 0802 Last data filed at 08/25/17 0600  Gross per 24 hour  Intake           893.04 ml  Output             1475 ml  Net          -581.96 ml   Filed Weights   08/23/17 1754 08/25/17 0600  Weight: 200 lb (90.7 kg) 208 lb 1.8 oz (94.4 kg)    Telemetry    NSR with  intermittent junctional rhythm - Personally Reviewed  ECG    None today - Personally Reviewed  Physical Exam   GEN: overweight WM, No acute distress.   Neck: difficult to assess JVD with thick neck Cardiac: RRR, no murmurs, rubs, or gallops.  Respiratory: Clear to auscultation bilaterally. GI: Soft, nontender, non-distended  MS: No edema; No deformity. Right groin with IABP- no hematoma. Right foot warm and dry with palpable pulse. Good left femoral pulse. Left foot cool but perfused.  Neuro:  Nonfocal  Psych: Normal affect   Labs    Chemistry Recent Labs Lab 08/23/17 1724 08/23/17 1733 08/23/17 2127 08/24/17 0327 08/24/17 1033 08/25/17 0422  NA 134* 137  --  134*  --  135  K 4.4 4.5  --  4.3  --  4.0  CL 100* 100*  --  101  --  101  CO2 23  --   --  24  --  26  GLUCOSE 169* 166*  --  173*  --  217*  BUN 16 19  --  21*  --  30*  CREATININE 2.82* 2.70*  --  2.69*  --  1.63*  CALCIUM 8.7*  --   --  8.5*  --  8.7*  PROT 6.5  --  6.5  --  6.3* 6.6  ALBUMIN 3.5  --  3.4*  --  3.4* 3.3*  AST 42*  --  50*  --  74* 74*  ALT 22  --  23  --  26 26  ALKPHOS 55  --  55  --  54 53  BILITOT 0.5  --  0.3  --  0.3 0.4  GFRNONAA 22*  --   --  24*  --  43*  GFRAA 26*  --   --  27*  --  50*  ANIONGAP 11  --   --  9  --  8     Hematology Recent Labs Lab 08/24/17 0327 08/24/17 0644 08/25/17 0422  WBC 13.6* 13.3* 13.4*  RBC 4.63 4.58 4.46  HGB 13.8 13.7 13.2  HCT 42.2 41.8 40.4  MCV 91.1 91.3 90.6  MCH 29.8 29.9 29.6  MCHC 32.7 32.8 32.7  RDW 14.3 14.2 14.3  PLT 176 171 174    Cardiac Enzymes Recent Labs Lab 08/23/17 1724 08/23/17 1927 08/24/17 0027 08/24/17 0644  TROPONINI 1.95* 2.63* 3.76* 5.00*   No results for input(s): TROPIPOC in the last 168 hours.   BNP Recent Labs Lab 08/23/17 1927  BNP 200.0*     DDimer No results for input(s): DDIMER in the last 168 hours.   Radiology    Dg Chest Port 1 View  Result Date: 08/25/2017 CLINICAL DATA:  MI,  balloon pump. EXAM: PORTABLE CHEST 1 VIEW COMPARISON:  08/24/2017 FINDINGS: Intra- aortic balloon pop and right PICC line remain in place, unchanged. Cardiomegaly with vascular congestion. Mild interstitial prominence throughout the lungs could reflect interstitial edema. Small right pleural effusion. No pneumothorax. IMPRESSION: Mild pulmonary edema pattern. Small right effusion. Support devices stable. Electronically Signed   By: Charlett Nose M.D.   On: 08/25/2017 07:40   Dg Chest Port 1 View  Result Date: 08/24/2017 CLINICAL DATA:  PICC line placement. EXAM: PORTABLE CHEST 1 VIEW COMPARISON:  Earlier today 0835 hours. FINDINGS: 1326 hours. Numerous leads and wires project over the chest. Right-sided PICC line terminates at the superior caval/ atrial junction versus high right atrium. Midline trachea. Cardiomegaly accentuated by AP portable technique. No pleural effusion or pneumothorax. Low lung volumes with resultant pulmonary interstitial prominence. Suspect mild pulmonary venous congestion, similar. Volume loss at both lung bases. Intra-aortic balloon pump is unchanged. IMPRESSION: Right-sided PICC line terminating at the superior caval/ atrial junction versus right atrium. Cardiomegaly and low lung volumes with mild pulmonary venous congestion. Electronically Signed   By: Jeronimo Greaves M.D.   On: 08/24/2017 13:45   Dg Chest Port 1 View  Result Date: 08/24/2017 CLINICAL DATA:  Myocardial infarction. EXAM: PORTABLE CHEST 1 VIEW COMPARISON:  August 23, 2017 FINDINGS: Cardiomegaly. The hila and mediastinum are unchanged. Mild edema, not seen previously. No other acute abnormalities. IMPRESSION: Cardiomegaly.  Interval development of mild edema. Electronically Signed   By: Gerome Sam III M.D   On: 08/24/2017 08:56   Dg Chest Port 1 View  Result Date: 08/23/2017 CLINICAL DATA:  Acute chest pain for 2 days. EXAM: PORTABLE CHEST 1 VIEW COMPARISON:  01/02/2016 FINDINGS: Cardiomegaly again  identified. There is no evidence of focal airspace disease, pulmonary edema, suspicious pulmonary nodule/mass, pleural effusion, or pneumothorax. No acute bony abnormalities are identified. IMPRESSION: Cardiomegaly without evidence of acute cardiopulmonary disease. Electronically  Signed   By: Harmon Pier M.D.   On: 08/23/2017 17:45    Cardiac Studies   Echo: 08/23/17: Study Conclusions  - Left ventricle: The cavity size was normal. Wall thickness was   increased in a pattern of moderate LVH. Systolic function was   vigorous. The estimated ejection fraction was in the range of 65%   to 70%. Wall motion was normal; there were no regional wall   motion abnormalities. The study is not technically sufficient to   allow evaluation of LV diastolic function. - Aortic valve: Mildly calcified annulus. Trileaflet; normal   thickness leaflets. Valve area (VTI): 1.95 cm^2. Valve area   (Vmax): 1.73 cm^2. - Right ventricle: The cavity size was moderately dilated. The RV   shares the apex with the LV. Systolic function was moderately to   severely reduced. The RV free wall is hypokinetic. - Inferior vena cava: The vessel was dilated. The respirophasic   diameter changes were blunted (< 50%), consistent with elevated   central venous pressure. - Pericardium, extracardiac: There is a trivial circumferential   pericardial effusion Echodensity attached to the RV apex looks   most consistent with fat pad as it is adherent to the muscle wall   and moves in coordination.  Cardiac cath: 08/23/17: Conclusion     Prox RCA to Mid RCA lesion, 100 %stenosed.  Ost LAD lesion, 90 %stenosed.  Mid LAD lesion, 75 %stenosed.  1st Diag lesion, 75 %stenosed.   ZOHAIB HEENEY is a 66 y.o. male    [id number] LOCATION:  FACILITY: [facility]  PHYSICIAN: Nanetta Batty, M.D. May 17, 1951   DATE OF PROCEDURE:  08/23/2017  DATE OF DISCHARGE:     CARDIAC CATHETERIZATION / IABP    History obtained  from chart review. Mr. Lewelling is a 66 year old mild to moderately overweight Caucasian male formerly a patient of Dr. Kandis Cocking and subsequently Dr. Anola Gurney. He has a history of CAD status post myocardial function 1999 with RCA stenting. At that time he had minimal disease in his LAD. He does continue to smoke and history of hypertension, hyperlipidemia and diabetes. He did have critical limb ischemia on the left with a left femoropopliteal bypass grafting. He developed chest pain at 11:00 last night which persisted throughout the night and day today finally arriving at the emergency room late this afternoon with EKG showed subtle anterior ST segment elevation, right bundle branch block which was old. His initial blood pressure at home was 60 mmHg and when he arrived at the emergency room 80 mmHg. He did receive a fluid bolus. It was decided to bring him to the Cath Lab for urgent cath and potential intervention as well as hemodynamic support.    IMPRESSION: Mr. Watchman has severe three-vessel disease with a highly calcified ostial LAD, to the right with left right collaterals and needs AV groove circumflex disease. He has moderately severe renal insufficiency which may be related to ATN from shock. His EDP was only 15. His blood pressure was marginal at between 95 and 100 and I therefore elected to place an intra-aortic balloon pump for hemodynamic support. His pain improved by the end of the procedure. He was on aspirin and Plavix at home. His Plavix has been discontinued. The echo has been ordered. I placed him on IV heparin. He will need coronary artery bypass grafting midweek. He left the lab in stable condition. I have discussed this case with Dr. Kathlee Nations Trigt. Nanetta Batty. MD, [facility] 08/23/2017 6:49 PM  Indications   Acute ST elevation myocardial infarction (STEMI) involving left anterior descending (LAD) coronary artery (HCC) [I21.02 (ICD-10-CM)]  Cardiogenic shock (HCC)  [R57.0 (ICD-10-CM)]  Procedural Details/Technique   Technical Details PROCEDURE DESCRIPTION:   The patient was brought to the second floor [place] [facility] cath lab in the postabsorptive state. He was not premedicated . His right groin was prepped and shaved in usual sterile fashion. Xylocaine 1% was used for local anesthesia. A 6 upgraded to a 7 [place] sheath was inserted into the Right common femoral artery using standard Seldinger technique. A 5 French left Judkins catheters along with a 5 French pigtail catheter were used for selective coronary angiography, so selective left internal mammary artery angiography, distal abdominal aortography and left heart pressures. Isovue was used for the entirety of the case. Retrograde aortic, left ventricular and pullback pressures were recorded. An intra-aortic balloon pump was placed via the right femoral artery.   Estimated blood loss <50 mL.  During this procedure no sedation was administered.    Coronary Findings   Dominance: Right  Left Anterior Descending  Ost LAD lesion, 90% stenosed. The lesion is concentric. The lesion is calcified.  Mid LAD lesion, 75% stenosed.  First Diagonal Branch  1st Diag lesion, 75% stenosed.  Right Coronary Artery  Prox RCA to Mid RCA lesion, 100% stenosed. The lesion was previously treated.  Right Posterior Descending Artery  Ost RPDA filled by collaterals from [facility] 1st Sept.  Impella/IABP   Hemodynamic Support An IABP was inserted for hemodynamic support in the setting of cardiogenic shock. Access site: right femoral artery.    Coronary Diagrams   Diagnostic Diagram          Patient Profile     66 y.o. male with history of remote inferior MI 1999 with stenting of the mid RCA, PVD with left fem-pop bypass, tobacco abuse, HTN, HLD, and DM type 2 presented with acute inferior infarct associated with cardiogenic shock, RV infarct, junctional rhythm.  Assessment & Plan    1. Acute RV infarct/STEMI -  ST elevation V1-3. Troponin peak 5.0. Cardiac cath showed Occluded RCA proximally with collaterals. The LAD was severely diseased in the proximal and mid vessel with heavy calcification. Also moderate disease in proximal LCx extending into OM1 and OM2. PCI not attempted. Evaluated by Dr. Donata Clay and plan for CABG later this week. Plavix on hold. On IV heparin and ASA. On high dose statin. Need to let RV function recover as much as possible to tolerate being on bypass. 2. Cardiogenic shock due to RV infarct. Echo reviewed personally. The basal inferior wall is akinetic. Otherwise LV function looks good. The RV is akinetic. On support with IV milrinone and IABP. Weaned off norepinephrine. Continue IABP until time of surgery.  3. Junctional rhythm. Improving. Now in NSR. Avoid beta blocker with hypotension and bradycardia.  4. Acute renal failure. Creatinine improved 2.63>>1.6. Good urine output.  5. Lactic acidosis due to Cardiogenic shock improved.  6. PAD s/p left fem-pop BPG. 7. DM type 2. A1c 6%. On SSI 8. Hypercholesterolemia. On high dose statin. 9. N/V- abdomen benign. Most likely still related to infarct and pressors. On IV Protonix.   The patient is critically ill with multiple organ systems failure and requires high complexity decision making for assessment and support, frequent evaluation and titration of therapies, application of advanced monitoring technologies and extensive interpretation of multiple databases.  For questions or updates, please contact [facility] Please consult [email or web address] for contact info  under Cardiology/STEMI.      Signed, Fawna Cranmer [place], MD  08/25/2017, 8:02 AM

## 2017-08-25 NOTE — Plan of Care (Signed)
Problem: Education: Goal: Understanding of cardiac disease, CV risk reduction, and recovery process will improve Outcome: Not Progressing Pt. Lacks understanding of treatment plan.

## 2017-08-25 NOTE — Plan of Care (Signed)
Problem: Activity: Goal: Ability to tolerate increased activity will improve Outcome: Not Progressing Pt. Is currently on IABP and on strict bedrest.

## 2017-08-26 ENCOUNTER — Encounter (HOSPITAL_COMMUNITY): Payer: PPO

## 2017-08-26 ENCOUNTER — Inpatient Hospital Stay (HOSPITAL_COMMUNITY): Payer: PPO

## 2017-08-26 ENCOUNTER — Other Ambulatory Visit (HOSPITAL_COMMUNITY): Payer: PPO

## 2017-08-26 DIAGNOSIS — I10 Essential (primary) hypertension: Secondary | ICD-10-CM

## 2017-08-26 DIAGNOSIS — Z01810 Encounter for preprocedural cardiovascular examination: Secondary | ICD-10-CM

## 2017-08-26 LAB — GLUCOSE, CAPILLARY
GLUCOSE-CAPILLARY: 205 mg/dL — AB (ref 65–99)
Glucose-Capillary: 193 mg/dL — ABNORMAL HIGH (ref 65–99)
Glucose-Capillary: 152 mg/dL — ABNORMAL HIGH (ref 65–99)
Glucose-Capillary: 146 mg/dL — ABNORMAL HIGH (ref 65–99)

## 2017-08-26 LAB — COMPREHENSIVE METABOLIC PANEL
ALT: 25 U/L (ref 17–63)
AST: 51 U/L — ABNORMAL HIGH (ref 15–41)
Albumin: 3.2 g/dL — ABNORMAL LOW (ref 3.5–5.0)
Alkaline Phosphatase: 50 U/L (ref 38–126)
Anion gap: 8 (ref 5–15)
BUN: 24 mg/dL — ABNORMAL HIGH (ref 6–20)
CO2: 28 mmol/L (ref 22–32)
Calcium: 8.7 mg/dL — ABNORMAL LOW (ref 8.9–10.3)
Chloride: 100 mmol/L — ABNORMAL LOW (ref 101–111)
Creatinine, Ser: 1.11 mg/dL (ref 0.61–1.24)
GFR calc Af Amer: >60 mL/min (ref >60)
GFR calc non Af Amer: >60 mL/min (ref >60)
Glucose, Bld: 175 mg/dL — ABNORMAL HIGH (ref 65–99)
Potassium: 3.9 mmol/L (ref 3.5–5.1)
Sodium: 136 mmol/L (ref 135–145)
Total Bilirubin: 0.6 mg/dL (ref 0.3–1.2)
Total Protein: 6.4 g/dL — ABNORMAL LOW (ref 6.5–8.1)

## 2017-08-26 LAB — CBC
HEMATOCRIT: 36.4 % — AB (ref 39.0–52.0)
HEMOGLOBIN: 11.6 g/dL — AB (ref 13.0–17.0)
MCH: 29.1 pg (ref 26.0–34.0)
MCHC: 31.9 g/dL (ref 30.0–36.0)
MCV: 91.5 fL (ref 78.0–100.0)
Platelets: 142 10*3/uL — ABNORMAL LOW (ref 150–400)
RBC: 3.98 MIL/uL — AB (ref 4.22–5.81)
RDW: 14 % (ref 11.5–15.5)
WBC: 11.3 10*3/uL — ABNORMAL HIGH (ref 4.0–10.5)

## 2017-08-26 LAB — COOXEMETRY PANEL
Carboxyhemoglobin: 0.8 % (ref 0.5–1.5)
Methemoglobin: 1.4 % (ref 0.0–1.5)
O2 Saturation: 67.2 %
Total hemoglobin: 12.2 g/dL (ref 12.0–16.0)

## 2017-08-26 LAB — POCT I-STAT, CHEM 8
BUN: 24 mg/dL — AB (ref 6–20)
CALCIUM ION: 1.18 mmol/L (ref 1.15–1.40)
Chloride: 97 mmol/L — ABNORMAL LOW (ref 101–111)
Creatinine, Ser: 0.9 mg/dL (ref 0.61–1.24)
GLUCOSE: 195 mg/dL — AB (ref 65–99)
HCT: 35 % — ABNORMAL LOW (ref 39.0–52.0)
Hemoglobin: 11.9 g/dL — ABNORMAL LOW (ref 13.0–17.0)
Potassium: 3.6 mmol/L (ref 3.5–5.1)
SODIUM: 139 mmol/L (ref 135–145)
TCO2: 29 mmol/L (ref 22–32)

## 2017-08-26 LAB — HEPARIN LEVEL (UNFRACTIONATED): Heparin Unfractionated: 0.28 IU/mL — ABNORMAL LOW (ref 0.30–0.70)

## 2017-08-26 MED ORDER — ALPRAZOLAM 0.5 MG PO TABS
0.5000 mg | ORAL_TABLET | Freq: Two times a day (BID) | ORAL | Status: DC | PRN
  Administered 2017-08-26: 0.5 mg via ORAL
  Filled 2017-08-26: qty 1

## 2017-08-26 MED ORDER — TRAZODONE HCL 50 MG PO TABS
100.0000 mg | ORAL_TABLET | Freq: Every day | ORAL | Status: DC
  Administered 2017-08-26 – 2017-08-27 (×2): 100 mg via ORAL
  Filled 2017-08-26 (×2): qty 2

## 2017-08-26 MED ORDER — DOCUSATE SODIUM 100 MG PO CAPS
200.0000 mg | ORAL_CAPSULE | Freq: Every day | ORAL | Status: DC
  Administered 2017-08-26 – 2017-08-27 (×2): 200 mg via ORAL
  Filled 2017-08-26 (×2): qty 2

## 2017-08-26 MED ORDER — FUROSEMIDE 10 MG/ML IJ SOLN
40.0000 mg | Freq: Two times a day (BID) | INTRAMUSCULAR | Status: DC
  Administered 2017-08-26 – 2017-08-27 (×4): 40 mg via INTRAVENOUS
  Filled 2017-08-26 (×4): qty 4

## 2017-08-26 MED ORDER — DIAZEPAM 5 MG PO TABS
5.0000 mg | ORAL_TABLET | Freq: Once | ORAL | Status: AC
  Administered 2017-08-26: 5 mg via ORAL
  Filled 2017-08-26: qty 1

## 2017-08-26 MED ORDER — POTASSIUM CHLORIDE 10 MEQ/50ML IV SOLN
10.0000 meq | INTRAVENOUS | Status: AC
  Administered 2017-08-26 (×3): 10 meq via INTRAVENOUS
  Filled 2017-08-26: qty 50

## 2017-08-26 MED ORDER — METOLAZONE 5 MG PO TABS
5.0000 mg | ORAL_TABLET | Freq: Every day | ORAL | Status: DC
  Administered 2017-08-26 – 2017-08-27 (×2): 5 mg via ORAL
  Filled 2017-08-26 (×2): qty 1

## 2017-08-26 MED ORDER — SORBITOL 70 % SOLN
30.0000 mL | Freq: Once | Status: AC
  Administered 2017-08-26: 30 mL via ORAL
  Filled 2017-08-26: qty 30

## 2017-08-26 NOTE — Progress Notes (Signed)
Right Lower Extremity Vein Map    Right Great Saphenous Vein   Segment Diameter Comment  1. Origin 5.61mm   2. High Thigh 4.59mm   3. Mid Thigh 4.75mm branch  4. Low Thigh 4.43mm   5. At Knee 3.75mm   6. High Calf 3.31mm branch  7. Low Calf 3.35mm   8. Ankle 1.82mm    mm    mm    mm     Farrel Demark, RDMS, RVT 08/26/2017

## 2017-08-26 NOTE — Progress Notes (Signed)
Patient introduced to incentive spirometer, patient achieved 1000 X10, will continue to work with patient on IS 10X every hr while awake.  VAS [name] of lower extremities saphenous vein mapping ordered, called and spoke to vascular, study to be completed today.  Hermina Barters, RN

## 2017-08-26 NOTE — Progress Notes (Signed)
Pre-op Cardiac Surgery  Carotid Findings:  Unable to determine level of stenosis due to patient being on balloon pump. Mild to moderate heterogenous plaque noted at bilateral proximal ICA.   Upper Extremity Right Left  Brachial Pressures Restricted arm 128  Radial Waveforms Tri Tri  Ulnar Waveforms Tri Tri  Palmar Arch (Allen's Test) Obliterates with radial compression, normal with ulnar compression Obliterates with radial compression, normal with ulnar compression     Lower  Extremity ABI Right Left      Anterior Tibial 95 150  Posterior Tibial Unable to obtain/ not audible Unable to obtain/ not audible  Ankle/Brachial Indices 0.74 1.17   Findings:  Unable to comment on waveforms due to balloon pump, also ABI may be inaccurate due to no criteria for patients on balloon pump. Right ABI indicates moderate disease. Left ABI WNL.    Clinton Harrington, RDMS, RVT 08/26/2017

## 2017-08-26 NOTE — Progress Notes (Addendum)
3:15pm CSW spoke with home social worker- pt mom has traditional Medicare and will not have qualifying stay to allow for SNF placement.  Social worker spoke with patient and patient did not think they could pay $6,000-7,000 in order to pay privately for short term SNF stay  CSW spoke with supervisor- no known resources to assist at this time  CSW received call back from [facility] ( [phone]) who states hospice has agreed to keep pt mom for now while seeing when patient can DC  CSW available to assist if needed  1:45pm CSW received consult to assist with disposition for patient mom since patient is primary caregiver and pt mom will have no one to care for her once she leaves [facility] after 5 night respite program.  CSW called [facility] to discuss- they are providing message to patient home social worker Gwenlyn Perking who will reach out to CSW  CSW will continue to follow and assist as able  Burna Sis, LCSW Clinical Social Worker [phone]

## 2017-08-26 NOTE — Progress Notes (Signed)
ANTICOAGULATION CONSULT NOTE - Follow Up Consult  Pharmacy Consult for Heparin Indication: severe 3v CAD; IABP  Allergies  Allergen Reactions  . Aspirin Other (See Comments)    Stomach upset  . Latex Rash  . Oxycodone-Acetaminophen Itching    Patient Measurements: Height:  (180.3 cm) Weight: 208 lb 15.9 oz (94.8 kg) IBW/kg (Calculated) : 75.3   Vital Signs: Temp: 97.9 F (36.6 C) (09/18 0431) Temp Source: Oral (09/18 0431) BP: 123/73 (09/18 0500) Pulse Rate: 77 (09/18 0700)  Labs:  Recent Labs  08/23/17 1724  08/23/17 1927  08/24/17 0027 08/24/17 0327 08/24/17 0644 08/24/17 1033 08/25/17 0422 08/26/17 0456  HGB 13.2  < >  --   --   --  13.8 13.7  --  13.2 11.6*  HCT 40.5  < >  --   --   --  42.2 41.8  --  40.4 36.4*  PLT 183  --   --   --   --  176 171  --  174 142*  APTT 28  --   --   --   --   --   --   --   --   --   LABPROT 14.3  --   --   --   --   --  14.6  --  14.7  --   INR 1.11  --   --   --   --   --  1.15  --  1.15  --   HEPARINUNFRC  --   --   --   < > 0.42 0.31  --  0.21* 0.16* 0.28*  CREATININE 2.82*  < >  --   --   --  2.69*  --   --  1.63* 1.11  TROPONINI 1.95*  --  2.63*  --  3.76*  --  5.00*  --   --   --   < > = values in this interval not displayed.  Estimated Creatinine Clearance: 80.1 mL/min (by C-G formula based on SCr of 1.11 mg/dL).   Medications:  Heparin @ 1050 units/hr  Assessment: 65yom presented as a code STEMI, s/p cath found to have severe 3 vessel CAD. IABP was placed and he was started on heparin pending CABG this week. Heparin level therapeutic at 0.28.   Goal of Therapy:  Heparin level 0.2-0.5 units/ml Monitor platelets by anticoagulation protocol: Yes   Plan:  1) Continue heparin at 1050 units/hr 2) Follow up daily heparin level and CBC  Donnajean Lopes Student Pharmacist

## 2017-08-26 NOTE — Progress Notes (Signed)
Progress Note  Patient Name: Clinton Harrington Date of Encounter: 08/26/2017  Primary Cardiologist: Nanetta Batty MD  Subjective   No complaints today. Nurse notes periods of confusion but he is oriented on my exam. Denies chest pain or SOB. No leg pain.  Inpatient Medications    Scheduled Meds: . aspirin EC  81 mg Oral Daily  . atorvastatin  80 mg Oral q1800  . brimonidine  1 drop Right Eye BID  . Chlorhexidine Gluconate Cloth  6 each Topical Daily  . docusate sodium  200 mg Oral Daily  . feeding supplement (GLUCERNA SHAKE)  237 mL Oral TID WC  . furosemide  40 mg Intravenous BID  . insulin aspart  0-20 Units Subcutaneous TID WC  . insulin aspart  0-5 Units Subcutaneous QHS  . mouth rinse  15 mL Mouth Rinse BID  . metoCLOPramide (REGLAN) injection  10 mg Intravenous Q6H  . mupirocin ointment  1 application Nasal BID  . nicotine  21 mg Transdermal Q24H  . pantoprazole (PROTONIX) IV  40 mg Intravenous Q12H  . sodium chloride flush  10-40 mL Intracatheter Q12H  . sodium chloride flush  3 mL Intravenous Q12H  . traZODone  100 mg Oral QHS   Continuous Infusions: . sodium chloride 250 mL (08/26/17 1400)  . heparin 1,050 Units/hr (08/26/17 1400)  . milrinone 0.25 mcg/kg/min (08/26/17 1400)  . norepinephrine (LEVOPHED) Adult infusion Stopped (08/25/17 0400)   PRN Meds: sodium chloride, acetaminophen, ALPRAZolam, alum & mag hydroxide-simeth, Influenza vac split quadrivalent PF, LORazepam, nitroGLYCERIN, ondansetron (ZOFRAN) IV, sodium chloride flush, sodium chloride flush   Vital Signs    Vitals:   08/26/17 1118 08/26/17 1200 08/26/17 1300 08/26/17 1400  BP:   (!) 141/66   Pulse:  81 78 71  Resp:  (!) 30 (!) 28 (!) 22  Temp: 98.2 F (36.8 C)     TempSrc: Oral     SpO2:  94% 94% 93%  Weight:      Height:        Intake/Output Summary (Last 24 hours) at 08/26/17 1411 Last data filed at 08/26/17 1400  Gross per 24 hour  Intake            641.6 ml  Output              2035 ml  Net          -1393.4 ml   Filed Weights   08/23/17 1754 08/25/17 0600 08/26/17 0500  Weight: 200 lb (90.7 kg) 208 lb 1.8 oz (94.4 kg) 208 lb 15.9 oz (94.8 kg)    Telemetry    NSR  - Personally Reviewed  ECG    None today - Personally Reviewed  Physical Exam   GEN: overweight WM, No acute distress.   Neck: difficult to assess JVD with thick neck Cardiac: RRR, no murmurs, rubs, or gallops.  Respiratory: Clear to auscultation bilaterally. GI: Soft, nontender, non-distended  MS: No edema; No deformity. Right groin with IABP- no hematoma. Right foot warm and dry with palpable pulse. Good left femoral pulse. Left foot cool but perfused.  Neuro:  Nonfocal  Psych: Normal affect   Labs    Chemistry  Recent Labs Lab 08/24/17 0327 08/24/17 1033 08/25/17 0422 08/26/17 0456  NA 134*  --  135 136  K 4.3  --  4.0 3.9  CL 101  --  101 100*  CO2 24  --  26 28  GLUCOSE 173*  --  217* 175*  BUN 21*  --  30* 24*  CREATININE 2.69*  --  1.63* 1.11  CALCIUM 8.5*  --  8.7* 8.7*  PROT  --  6.3* 6.6 6.4*  ALBUMIN  --  3.4* 3.3* 3.2*  AST  --  74* 74* 51*  ALT  --  ALKPHOS  --  54 53 50  BILITOT  --  0.3 0.4 0.6  GFRNONAA 24*  --  43* >60  GFRAA 27*  --  50* >60  ANIONGAP 9  --  8 8     Hematology  Recent Labs Lab 08/24/17 0644 08/25/17 0422 08/26/17 0456  WBC 13.3* 13.4* 11.3*  RBC 4.58 4.46 3.98*  HGB 13.7 13.2 11.6*  HCT 41.8 40.4 36.4*  MCV 91.3 90.6 91.5  MCH 29.9 29.6 29.1  MCHC 32.8 32.7 31.9  RDW 14.2 14.3 14.0  PLT 171 174 142*    Cardiac Enzymes  Recent Labs Lab 08/23/17 1724 08/23/17 1927 08/24/17 0027 08/24/17 0644  TROPONINI 1.95* 2.63* 3.76* 5.00*   No results for input(s): TROPIPOC in the last 168 hours.   BNP  Recent Labs Lab 08/23/17 1927  BNP 200.0*     DDimer No results for input(s): DDIMER in the last 168 hours.   Radiology    Dg Chest Port 1 View  Result Date: 08/26/2017 CLINICAL DATA:  MI.  Balloon pump.  EXAM: PORTABLE CHEST 1 VIEW COMPARISON:  08/25/2017 . FINDINGS: Right PICC line new in stable position. Balloon pump tip in stable position in the upper descending thoracic aorta below the arch. Cardiomegaly. Mild bilateral interstitial prominence, particular on the right consistent CHF. No interim change. Small right pleural effusion cannot be excluded. No pneumothorax P IMPRESSION: 1.  Right PICC line and aortic balloon pump in stable position. 2. Findings consistent with congestive heart failure with mild interstitial edema, primarily on the right. Small right pleural effusion. No change from prior exam. Electronically Signed   By: Maisie Fus  Register   On: 08/26/2017 07:54   Dg Chest Port 1 View  Result Date: 08/25/2017 CLINICAL DATA:  MI, balloon pump. EXAM: PORTABLE CHEST 1 VIEW COMPARISON:  08/24/2017 FINDINGS: Intra- aortic balloon pop and right PICC line remain in place, unchanged. Cardiomegaly with vascular congestion. Mild interstitial prominence throughout the lungs could reflect interstitial edema. Small right pleural effusion. No pneumothorax. IMPRESSION: Mild pulmonary edema pattern. Small right effusion. Support devices stable. Electronically Signed   By: Charlett Nose M.D.   On: 08/25/2017 07:40    Cardiac Studies   Echo: 08/23/17: Study Conclusions  - Left ventricle: The cavity size was normal. Wall thickness was   increased in a pattern of moderate LVH. Systolic function was   vigorous. The estimated ejection fraction was in the range of 65%   to 70%. Wall motion was normal; there were no regional wall   motion abnormalities. The study is not technically sufficient to   allow evaluation of LV diastolic function. - Aortic valve: Mildly calcified annulus. Trileaflet; normal   thickness leaflets. Valve area (VTI): 1.95 cm^2. Valve area   (Vmax): 1.73 cm^2. - Right ventricle: The cavity size was moderately dilated. The RV   shares the apex with the LV. Systolic function was moderately  to   severely reduced. The RV free wall is hypokinetic. - Inferior vena cava: The vessel was dilated. The respirophasic   diameter changes were blunted (< 50%), consistent with elevated   central venous pressure. - Pericardium, extracardiac: There is a  trivial circumferential   pericardial effusion Echodensity attached to the RV apex looks   most consistent with fat pad as it is adherent to the muscle wall   and moves in coordination.  Cardiac cath: 08/23/17: Conclusion     Prox RCA to Mid RCA lesion, 100 %stenosed.  Ost LAD lesion, 90 %stenosed.  Mid LAD lesion, 75 %stenosed.  1st Diag lesion, 75 %stenosed.   Clinton Harrington is a 66 y.o. male    [id number] LOCATION:  FACILITY: [facility]  PHYSICIAN: Nanetta Batty, M.D. Jun 18, 1951   DATE OF PROCEDURE:  08/23/2017  DATE OF DISCHARGE:     CARDIAC CATHETERIZATION / IABP    History obtained from chart review. Mr. Delpilar is a 66 year old mild to moderately overweight Caucasian male formerly a patient of Dr. Kandis Cocking and subsequently Dr. Anola Gurney. He has a history of CAD status post myocardial function 1999 with RCA stenting. At that time he had minimal disease in his LAD. He does continue to smoke and history of hypertension, hyperlipidemia and diabetes. He did have critical limb ischemia on the left with a left femoropopliteal bypass grafting. He developed chest pain at 11:00 last night which persisted throughout the night and day today finally arriving at the emergency room late this afternoon with EKG showed subtle anterior ST segment elevation, right bundle branch block which was old. His initial blood pressure at home was 60 mmHg and when he arrived at the emergency room 80 mmHg. He did receive a fluid bolus. It was decided to bring him to the Cath Lab for urgent cath and potential intervention as well as hemodynamic support.    IMPRESSION: Mr. Goostree has severe three-vessel disease with a highly calcified  ostial LAD, to the right with left right collaterals and needs AV groove circumflex disease. He has moderately severe renal insufficiency which may be related to ATN from shock. His EDP was only 15. His blood pressure was marginal at between 95 and 100 and I therefore elected to place an intra-aortic balloon pump for hemodynamic support. His pain improved by the end of the procedure. He was on aspirin and Plavix at home. His Plavix has been discontinued. The echo has been ordered. I placed him on IV heparin. He will need coronary artery bypass grafting midweek. He left the lab in stable condition. I have discussed this case with Dr. Kathlee Nations Trigt. Nanetta Batty. MD, [facility] 08/23/2017 6:49 PM      Indications   Acute ST elevation myocardial infarction (STEMI) involving left anterior descending (LAD) coronary artery (HCC) [I21.02 (ICD-10-CM)]  Cardiogenic shock (HCC) [R57.0 (ICD-10-CM)]  Procedural Details/Technique   Technical Details PROCEDURE DESCRIPTION:   The patient was brought to the second floor [facility] cath lab in the postabsorptive state. He was not premedicated . His right groin was prepped and shaved in usual sterile fashion. Xylocaine 1% was used for local anesthesia. A 6 upgraded to a 7 [place] sheath was inserted into the Right common femoral artery using standard Seldinger technique. A 5 French left Judkins catheters along with a 5 French pigtail catheter were used for selective coronary angiography, so selective left internal mammary artery angiography, distal abdominal aortography and left heart pressures. Isovue was used for the entirety of the case. Retrograde aortic, left ventricular and pullback pressures were recorded. An intra-aortic balloon pump was placed via the right femoral artery.   Estimated blood loss <50 mL.  During this procedure no sedation was administered.    Coronary Findings  Dominance: Right  Left Anterior Descending  Ost LAD  lesion, 90% stenosed. The lesion is concentric. The lesion is calcified.  Mid LAD lesion, 75% stenosed.  First Diagonal Branch  1st Diag lesion, 75% stenosed.  Right Coronary Artery  Prox RCA to Mid RCA lesion, 100% stenosed. The lesion was previously treated.  Right Posterior Descending Artery  Ost RPDA filled by collaterals from [facility] Sept.  Impella/IABP   Hemodynamic Support An IABP was inserted for hemodynamic support in the setting of cardiogenic shock. Access site: right femoral artery.    Coronary Diagrams   Diagnostic Diagram          Patient Profile     66 y.o. male with history of remote inferior MI 1999 with stenting of the mid RCA, PVD with left fem-pop bypass, tobacco abuse, HTN, HLD, and DM type 2 presented with acute inferior infarct associated with cardiogenic shock, RV infarct, junctional rhythm.  Assessment & Plan    1. Acute RV infarct/STEMI - ST elevation V1-3. Troponin peak 5.0. Cardiac cath showed Occluded RCA proximally with collaterals. The LAD was severely diseased in the proximal and mid vessel with heavy calcification. Also moderate disease in proximal LCx extending into OM1 and OM2. PCI not attempted. Evaluated by Dr. Donata Clay and plan for CABG later this week. Plavix on hold. On IV heparin and ASA. On high dose statin. Need to let RV function recover as much as possible to tolerate being on bypass. 2. Cardiogenic shock due to RV infarct. Echo reviewed personally. The basal inferior wall is akinetic. Otherwise LV function looks good. The RV is akinetic. On support with IV milrinone and IABP. Weaned off norepinephrine. Continue IABP until time of surgery.  3. Junctional rhythm. Improved. Now in NSR. Avoid beta blocker with hypotension and bradycardia.  4. Acute renal failure. Creatinine improved 2.63>>1.6>>1.11. Good urine output.  5. Lactic acidosis due to Cardiogenic shock improved.  6. PAD s/p left fem-pop BPG. 7. DM type 2. A1c 6%. On SSI 8.  Hypercholesterolemia. On high dose statin. 9. N/V- abdomen benign. Improved today.  The patient is critically ill with multiple organ systems failure and requires high complexity decision making for assessment and support, frequent evaluation and titration of therapies, application of advanced monitoring technologies and extensive interpretation of multiple databases.  For questions or updates, please contact [facility] Please consult [email or web address] for contact info under Cardiology/STEMI.      Signed, Peter [place], MD  08/26/2017, 2:11 PM

## 2017-08-27 ENCOUNTER — Inpatient Hospital Stay (HOSPITAL_COMMUNITY): Payer: PPO

## 2017-08-27 DIAGNOSIS — I213 ST elevation (STEMI) myocardial infarction of unspecified site: Secondary | ICD-10-CM

## 2017-08-27 DIAGNOSIS — R570 Cardiogenic shock: Secondary | ICD-10-CM

## 2017-08-27 DIAGNOSIS — I25110 Atherosclerotic heart disease of native coronary artery with unstable angina pectoris: Secondary | ICD-10-CM

## 2017-08-27 DIAGNOSIS — I480 Paroxysmal atrial fibrillation: Secondary | ICD-10-CM

## 2017-08-27 DIAGNOSIS — I25118 Atherosclerotic heart disease of native coronary artery with other forms of angina pectoris: Secondary | ICD-10-CM

## 2017-08-27 LAB — COMPREHENSIVE METABOLIC PANEL
ALT: 27 U/L (ref 17–63)
AST: 40 U/L (ref 15–41)
Albumin: 3.1 g/dL — ABNORMAL LOW (ref 3.5–5.0)
Alkaline Phosphatase: 54 U/L (ref 38–126)
Anion gap: 8 (ref 5–15)
BUN: 16 mg/dL (ref 6–20)
CO2: 31 mmol/L (ref 22–32)
Calcium: 8.8 mg/dL — ABNORMAL LOW (ref 8.9–10.3)
Chloride: 97 mmol/L — ABNORMAL LOW (ref 101–111)
Creatinine, Ser: 1.02 mg/dL (ref 0.61–1.24)
GFR calc Af Amer: >60 mL/min (ref >60)
GFR calc non Af Amer: >60 mL/min (ref >60)
Glucose, Bld: 183 mg/dL — ABNORMAL HIGH (ref 65–99)
Potassium: 3.5 mmol/L (ref 3.5–5.1)
Sodium: 136 mmol/L (ref 135–145)
Total Bilirubin: 0.6 mg/dL (ref 0.3–1.2)
Total Protein: 6.6 g/dL (ref 6.5–8.1)

## 2017-08-27 LAB — CBC
HCT: 35.3 % — ABNORMAL LOW (ref 39.0–52.0)
Hemoglobin: 11.5 g/dL — ABNORMAL LOW (ref 13.0–17.0)
MCH: 29.5 pg (ref 26.0–34.0)
MCHC: 32.6 g/dL (ref 30.0–36.0)
MCV: 90.5 fL (ref 78.0–100.0)
Platelets: 137 10*3/uL — ABNORMAL LOW (ref 150–400)
RBC: 3.9 MIL/uL — ABNORMAL LOW (ref 4.22–5.81)
RDW: 13.8 % (ref 11.5–15.5)
WBC: 9.9 10*3/uL (ref 4.0–10.5)

## 2017-08-27 LAB — POCT I-STAT, CHEM 8
BUN: 19 mg/dL (ref 6–20)
CALCIUM ION: 1.24 mmol/L (ref 1.15–1.40)
CHLORIDE: 93 mmol/L — AB (ref 101–111)
Creatinine, Ser: 0.9 mg/dL (ref 0.61–1.24)
GLUCOSE: 188 mg/dL — AB (ref 65–99)
HCT: 34 % — ABNORMAL LOW (ref 39.0–52.0)
Hemoglobin: 11.6 g/dL — ABNORMAL LOW (ref 13.0–17.0)
Potassium: 3.9 mmol/L (ref 3.5–5.1)
Sodium: 134 mmol/L — ABNORMAL LOW (ref 135–145)
TCO2: 33 mmol/L — AB (ref 22–32)

## 2017-08-27 LAB — GLUCOSE, CAPILLARY
GLUCOSE-CAPILLARY: 204 mg/dL — AB (ref 65–99)
GLUCOSE-CAPILLARY: 228 mg/dL — AB (ref 65–99)
Glucose-Capillary: 194 mg/dL — ABNORMAL HIGH (ref 65–99)

## 2017-08-27 LAB — COOXEMETRY PANEL
Carboxyhemoglobin: 0.9 % (ref 0.5–1.5)
Methemoglobin: 1.5 % (ref 0.0–1.5)
O2 Saturation: 70 %
Total hemoglobin: 11.9 g/dL — ABNORMAL LOW (ref 12.0–16.0)

## 2017-08-27 LAB — HEPARIN LEVEL (UNFRACTIONATED)
HEPARIN UNFRACTIONATED: 0.12 [IU]/mL — AB (ref 0.30–0.70)
HEPARIN UNFRACTIONATED: 0.16 [IU]/mL — AB (ref 0.30–0.70)
Heparin Unfractionated: <0.1 IU/mL — ABNORMAL LOW (ref 0.30–0.70)

## 2017-08-27 LAB — ABO/RH: ABO/RH(D): O POS

## 2017-08-27 LAB — PREPARE RBC (CROSSMATCH)

## 2017-08-27 MED ORDER — AMIODARONE HCL IN DEXTROSE 360-4.14 MG/200ML-% IV SOLN
INTRAVENOUS | Status: AC
  Filled 2017-08-27: qty 200

## 2017-08-27 MED ORDER — TRANEXAMIC ACID (OHS) PUMP PRIME SOLUTION
2.0000 mg/kg | INTRAVENOUS | Status: DC
Start: 2017-08-28 — End: 2017-08-28
  Filled 2017-08-27: qty 1.9

## 2017-08-27 MED ORDER — MAGNESIUM SULFATE 50 % IJ SOLN
40.0000 meq | INTRAMUSCULAR | Status: DC
  Filled 2017-08-27: qty 10

## 2017-08-27 MED ORDER — DIAZEPAM 2 MG PO TABS
2.0000 mg | ORAL_TABLET | Freq: Once | ORAL | Status: AC
  Administered 2017-08-28: 2 mg via ORAL
  Filled 2017-08-27: qty 1

## 2017-08-27 MED ORDER — POTASSIUM CHLORIDE 10 MEQ/50ML IV SOLN
INTRAVENOUS | Status: AC
  Administered 2017-08-27: 10 meq via INTRAVENOUS
  Filled 2017-08-27: qty 150

## 2017-08-27 MED ORDER — SODIUM CHLORIDE 0.9 % IV SOLN
INTRAVENOUS | Status: DC
  Filled 2017-08-27: qty 1

## 2017-08-27 MED ORDER — METOPROLOL TARTRATE 12.5 MG HALF TABLET
12.5000 mg | ORAL_TABLET | Freq: Once | ORAL | Status: AC
  Administered 2017-08-28: 12.5 mg via ORAL
  Filled 2017-08-27: qty 1

## 2017-08-27 MED ORDER — SODIUM CHLORIDE 0.9 % IV SOLN
0.4000 ug/kg/h | INTRAVENOUS | Status: DC
  Administered 2017-08-27 (×2): 0.3 ug/kg/h via INTRAVENOUS
  Administered 2017-08-27: 0.4 ug/kg/h via INTRAVENOUS
  Filled 2017-08-27 (×4): qty 2

## 2017-08-27 MED ORDER — CHLORHEXIDINE GLUCONATE CLOTH 2 % EX PADS
6.0000 | MEDICATED_PAD | Freq: Once | CUTANEOUS | Status: AC
  Administered 2017-08-27: 6 via TOPICAL

## 2017-08-27 MED ORDER — BISACODYL 5 MG PO TBEC
5.0000 mg | DELAYED_RELEASE_TABLET | Freq: Once | ORAL | Status: AC
  Administered 2017-08-27: 5 mg via ORAL
  Filled 2017-08-27: qty 1

## 2017-08-27 MED ORDER — DEXMEDETOMIDINE HCL IN NACL 400 MCG/100ML IV SOLN
0.1000 ug/kg/h | INTRAVENOUS | Status: DC
  Filled 2017-08-27: qty 100

## 2017-08-27 MED ORDER — AMIODARONE HCL IN DEXTROSE 360-4.14 MG/200ML-% IV SOLN
60.0000 mg/h | INTRAVENOUS | Status: AC
  Administered 2017-08-27: 60 mg/h via INTRAVENOUS

## 2017-08-27 MED ORDER — NITROGLYCERIN IN D5W 200-5 MCG/ML-% IV SOLN
2.0000 ug/min | INTRAVENOUS | Status: AC
  Administered 2017-08-28: 16.6 ug/min via INTRAVENOUS
  Filled 2017-08-27: qty 250

## 2017-08-27 MED ORDER — TRANEXAMIC ACID 1000 MG/10ML IV SOLN
1.5000 mg/kg/h | INTRAVENOUS | Status: DC
  Filled 2017-08-27: qty 25

## 2017-08-27 MED ORDER — POTASSIUM CHLORIDE 10 MEQ/50ML IV SOLN
10.0000 meq | INTRAVENOUS | Status: AC
  Administered 2017-08-27 (×3): 10 meq via INTRAVENOUS
  Filled 2017-08-27: qty 50

## 2017-08-27 MED ORDER — CHLORHEXIDINE GLUCONATE 0.12 % MT SOLN
15.0000 mL | Freq: Once | OROMUCOSAL | Status: AC
  Administered 2017-08-28: 15 mL via OROMUCOSAL

## 2017-08-27 MED ORDER — CEFUROXIME SODIUM 750 MG IJ SOLR
750.0000 mg | INTRAMUSCULAR | Status: DC
  Filled 2017-08-27: qty 750

## 2017-08-27 MED ORDER — POTASSIUM CHLORIDE 10 MEQ/50ML IV SOLN
10.0000 meq | INTRAVENOUS | Status: AC
  Administered 2017-08-27 (×2): 10 meq via INTRAVENOUS
  Filled 2017-08-27: qty 50

## 2017-08-27 MED ORDER — PLASMA-LYTE 148 IV SOLN
INTRAVENOUS | Status: AC
  Administered 2017-08-28: 500 mL
  Filled 2017-08-27: qty 2.5

## 2017-08-27 MED ORDER — TRANEXAMIC ACID (OHS) BOLUS VIA INFUSION
15.0000 mg/kg | INTRAVENOUS | Status: AC
  Administered 2017-08-28: 1422 mg via INTRAVENOUS
  Filled 2017-08-27: qty 1422

## 2017-08-27 MED ORDER — METOLAZONE 5 MG PO TABS
5.0000 mg | ORAL_TABLET | Freq: Once | ORAL | Status: AC
  Administered 2017-08-27: 5 mg via ORAL
  Filled 2017-08-27: qty 1

## 2017-08-27 MED ORDER — SODIUM CHLORIDE 0.9 % IV SOLN
30.0000 ug/min | INTRAVENOUS | Status: DC
  Filled 2017-08-27: qty 2

## 2017-08-27 MED ORDER — DOPAMINE-DEXTROSE 3.2-5 MG/ML-% IV SOLN
0.0000 ug/kg/min | INTRAVENOUS | Status: DC
  Filled 2017-08-27: qty 250

## 2017-08-27 MED ORDER — HEPARIN SODIUM (PORCINE) 1000 UNIT/ML IJ SOLN
INTRAMUSCULAR | Status: DC
  Filled 2017-08-27: qty 30

## 2017-08-27 MED ORDER — POTASSIUM CHLORIDE 2 MEQ/ML IV SOLN
80.0000 meq | INTRAVENOUS | Status: DC
  Filled 2017-08-27: qty 40

## 2017-08-27 MED ORDER — DEXTROSE 5 % IV SOLN
0.0000 ug/min | INTRAVENOUS | Status: AC
  Administered 2017-08-28: 3 ug/min via INTRAVENOUS
  Filled 2017-08-27: qty 4

## 2017-08-27 MED ORDER — POTASSIUM CHLORIDE 10 MEQ/50ML IV SOLN
INTRAVENOUS | Status: AC
  Filled 2017-08-27: qty 50

## 2017-08-27 MED ORDER — METOPROLOL TARTRATE 12.5 MG HALF TABLET
12.5000 mg | ORAL_TABLET | Freq: Two times a day (BID) | ORAL | Status: DC
  Administered 2017-08-27: 12.5 mg via ORAL
  Filled 2017-08-27: qty 1

## 2017-08-27 MED ORDER — DEXTROSE 5 % IV SOLN
1.5000 g | INTRAVENOUS | Status: AC
  Administered 2017-08-28: 1.5 g via INTRAVENOUS
  Administered 2017-08-28: .75 g via INTRAVENOUS
  Filled 2017-08-27: qty 1.5

## 2017-08-27 MED ORDER — AMIODARONE IV BOLUS ONLY 150 MG/100ML
150.0000 mg | Freq: Once | INTRAVENOUS | Status: AC
  Administered 2017-08-27: 150 mg via INTRAVENOUS

## 2017-08-27 MED ORDER — VANCOMYCIN HCL 10 G IV SOLR
1500.0000 mg | INTRAVENOUS | Status: AC
  Administered 2017-08-28: 1500 mg via INTRAVENOUS
  Filled 2017-08-27: qty 1500

## 2017-08-27 MED ORDER — AMIODARONE HCL IN DEXTROSE 360-4.14 MG/200ML-% IV SOLN
60.0000 mg/h | INTRAVENOUS | Status: DC
  Administered 2017-08-27 – 2017-08-28 (×3): 60 mg/h via INTRAVENOUS
  Filled 2017-08-27 (×3): qty 200

## 2017-08-27 NOTE — Progress Notes (Signed)
4 Days Post-Op Procedure(s) (LRB): LEFT HEART CATH AND CORS/GRAFTS ANGIOGRAPHY (N/A) IABP Insertion (N/A) Subjective: Diuresing Co-ox .70 Vein mapping shows R GSV usable CASBG in am  Objective: Vital signs in last 24 hours: Temp:  [98.2 F (36.8 C)-99.3 F (37.4 C)] 99.3 F (37.4 C) (09/19 0745) Pulse Rate:  [33-118] 33 (09/19 0800) Cardiac Rhythm: Atrial fibrillation (09/19 0745) Resp:  [19-32] 31 (09/19 0800) BP: (82-157)/(54-88) 99/75 (09/19 0800) SpO2:  [90 %-97 %] 96 % (09/19 0800)  Hemodynamic parameters for last 24 hours: CVP:  [10 mmHg-16 mmHg] 12 mmHg  Intake/Output from previous day: 09/18 0701 - 09/19 0700 In: 852.6 [I.V.:652.6; IV Piggyback:200] Out: 3821 [Urine:3820; Stool:1] Intake/Output this shift: Total I/O In: 24.4 [I.V.:24.4] Out: 50 [Urine:50]       Exam    General- alert and comfortable   Lungs- clear without rales, wheezes   Cor- regular rate and rhythm, no murmur , gallop   Abdomen- soft, non-tender   Extremities - warm, non-tender, minimal edema   Neuro- oriented, appropriate, no focal weakness   Lab Results:  Recent Labs  08/26/17 0456 08/26/17 1610 08/27/17 0414  WBC 11.3*  --  9.9  HGB 11.6* 11.9* 11.5*  HCT 36.4* 35.0* 35.3*  PLT 142*  --  137*   BMET:  Recent Labs  08/26/17 0456 08/26/17 1610 08/27/17 0414  NA 136 139 136  K 3.9 3.6 3.5  CL 100* 97* 97*  CO2 28  --  31  GLUCOSE 175* 195* 183*  BUN 24* 24* 16  CREATININE 1.11 0.90 1.02  CALCIUM 8.7*  --  8.8*    PT/INR:  Recent Labs  08/25/17 0422  LABPROT 14.7  INR 1.15   ABG    Component Value Date/Time   PHART 7.349 (L) 08/24/2017 0842   HCO3 24.1 08/24/2017 0842   TCO2 29 08/26/2017 1610   ACIDBASEDEF 2.0 08/24/2017 0842   O2SAT 70.0 08/27/2017 0435   CBG (last 3)   Recent Labs  08/26/17 1535 08/26/17 2119 08/27/17 0759  GLUCAP 152* 146* 194*    Assessment/Plan: S/P Procedure(s) (LRB): LEFT HEART CATH AND CORS/GRAFTS ANGIOGRAPHY  (N/A) IABP Insertion (N/A) cabg in am Treat preop afib with amio  LOS: 4 days    Clinton Harrington 08/27/2017

## 2017-08-27 NOTE — Progress Notes (Signed)
CT Surgery  I had a very complete disscusiion of CABG with MR Howk this pm at the bedside  He demonstrated good focus and understanding and agreement with planned CABG in am.

## 2017-08-27 NOTE — Anesthesia Preprocedure Evaluation (Addendum)
Anesthesia Evaluation  Patient identified by MRN, date of birth, ID band Patient awake    Reviewed: Allergy & Precautions, NPO status , Patient's Chart, lab work & pertinent test results  Airway Mallampati: II  TM Distance: >3 FB Neck ROM: Full    Dental   Pulmonary Current Smoker,    breath sounds clear to auscultation       Cardiovascular hypertension, Pt. on medications and Pt. on home beta blockers + CAD, + Past MI and + Peripheral Vascular Disease   Rhythm:Regular Rate:Normal     Neuro/Psych  Neuromuscular disease    GI/Hepatic negative GI ROS, Neg liver ROS,   Endo/Other  diabetes, Type 2, Oral Hypoglycemic Agents  Renal/GU negative Renal ROS     Musculoskeletal   Abdominal   Peds  Hematology  (+) anemia ,   Anesthesia Other Findings   Reproductive/Obstetrics                            Lab Results  Component Value Date   WBC 9.7 08/28/2017   HGB 11.9 (L) 08/28/2017   HCT 36.0 (L) 08/28/2017   MCV 89.3 08/28/2017   PLT 148 (L) 08/28/2017   Lab Results  Component Value Date   CREATININE 1.04 08/28/2017   BUN 20 08/28/2017   NA 133 (L) 08/28/2017   K 3.9 08/28/2017   CL 91 (L) 08/28/2017   CO2 32 08/28/2017   Echo: 08/23/17: Study Conclusions  - Left ventricle: The cavity size was normal. Wall thickness wasincreased in a pattern of moderate LVH. Systolic function wasvigorous. The estimated ejection fraction was in the range of 65%to 70%. Wall motion was normal; there were no regional wallmotion abnormalities. The study is not technically sufficient to allow evaluation of LV diastolic function. - Aortic valve: Mildly calcified annulus. Trileaflet; normal thickness leaflets. Valve area (VTI): 1.95 cm^2. Valve area(Vmax): 1.73 cm^2. - Right ventricle: The cavity size was moderately dilated. The RVshares the apex with the LV. Systolic function was moderately  toseverely reduced. The RV free wall is hypokinetic. - Inferior vena cava: The vessel was dilated. The respirophasicdiameter changes were blunted (<50%), consistent with elevatedcentral venous pressure. - Pericardium, extracardiac: There is a trivial circumferentialpericardial effusion Echodensity attached to the RV apex looksmost consistent with fat pad as it is adherent to the muscle walland moves in coordination. Anesthesia Physical Anesthesia Plan  ASA: IV  Anesthesia Plan: General   Post-op Pain Management:    Induction: Intravenous  PONV Risk Score and Plan: 2 and Ondansetron, Dexamethasone and Treatment may vary due to age or medical condition  Airway Management Planned: Oral ETT  Additional Equipment: Arterial line, CVP, PA Cath, TEE and Ultrasound Guidance Line Placement  Intra-op Plan:   Post-operative Plan: Post-operative intubation/ventilation  Informed Consent: I have reviewed the patients History and Physical, chart, labs and discussed the procedure including the risks, benefits and alternatives for the proposed anesthesia with the patient or authorized representative who has indicated his/her understanding and acceptance.   Dental advisory given  Plan Discussed with: CRNA  Anesthesia Plan Comments:        Anesthesia Quick Evaluation

## 2017-08-27 NOTE — Progress Notes (Signed)
ANTICOAGULATION CONSULT NOTE - Follow Up Consult  Pharmacy Consult for heparin Indication: IABP  Allergies  Allergen Reactions  . Aspirin Other (See Comments)    Stomach upset  . Latex Rash  . Oxycodone-Acetaminophen Itching    Patient Measurements: Height:  (180.3 cm) Weight: 208 lb 15.9 oz (94.8 kg) IBW/kg (Calculated) : 75.3 Heparin Dosing Weight: 90 kg  Vital Signs: Temp: 98.3 F (36.8 C) (09/19 2000) Temp Source: Oral (09/19 2000) BP: 138/91 (09/19 2000) Pulse Rate: 73 (09/19 2000)  Labs:  Recent Labs  08/25/17 0422 08/26/17 0456 08/26/17 1610 08/27/17 0414 08/27/17 1204 08/27/17 1602 08/27/17 1944  HGB 13.2 11.6* 11.9* 11.5*  --  11.6*  --   HCT 40.4 36.4* 35.0* 35.3*  --  34.0*  --   PLT 174 142*  --  137*  --   --   --   LABPROT 14.7  --   --   --   --   --   --   INR 1.15  --   --   --   --   --   --   HEPARINUNFRC 0.16* 0.28*  --  0.12* <0.10*  --  0.16*  CREATININE 1.63* 1.11 0.90 1.02  --  0.90  --     Estimated Creatinine Clearance: 98.7 mL/min (by C-G formula based on SCr of 0.9 mg/dL).   Medications:  Heparin 1150 units/hr  Assessment: 41 yoM presented as code STEMI now s/p cath to remain on heparin with IABP placed and awaiting CABG. Per TCTS, will continue with plans for CABG tomorrow morning.  Heparin level subtherapeutic this evening, but just below goal at 0.16 units/mL.  Noted heparin to turn off at ~0400 tomorrow morning in preparation for CABG. No bleeding noted.  Goal of Therapy:  Heparin level 0.2 - 0.5 units/ml Monitor platelets by anticoagulation protocol: Yes   Plan:  -Increase heparin to 1450 units/hr -No follow up level as infusion will be turned off before it can be clinically addressed -Follow up after OR  Jesselee Poth D. Yuchen Fedor, PharmD, BCPS Clinical Pharmacist [phone] 08/27/2017 8:40 PM

## 2017-08-27 NOTE — Progress Notes (Signed)
ANTICOAGULATION CONSULT NOTE - Follow Up Consult  Pharmacy Consult for heparin Indication: IABP  Allergies  Allergen Reactions  . Aspirin Other (See Comments)    Stomach upset  . Latex Rash  . Oxycodone-Acetaminophen Itching    Patient Measurements: Height:  (180.3 cm) Weight: 208 lb 15.9 oz (94.8 kg) IBW/kg (Calculated) : 75.3 Heparin Dosing Weight: 90 kg  Vital Signs: Temp: 99 F (37.2 C) (09/19 1100) Temp Source: Oral (09/19 1100) BP: 114/54 (09/19 1200) Pulse Rate: 71 (09/19 1300)  Labs:  Recent Labs  08/25/17 0422 08/26/17 0456 08/26/17 1610 08/27/17 0414 08/27/17 1204  HGB 13.2 11.6* 11.9* 11.5*  --   HCT 40.4 36.4* 35.0* 35.3*  --   PLT 174 142*  --  137*  --   LABPROT 14.7  --   --   --   --   INR 1.15  --   --   --   --   HEPARINUNFRC 0.16* 0.28*  --  0.12* <0.10*  CREATININE 1.63* 1.11 0.90 1.02  --     Estimated Creatinine Clearance: 87.1 mL/min (by C-G formula based on SCr of 1.02 mg/dL).   Medications:  Heparin 1150 units/hr  Assessment: 47 yoM presented as code STEMI now s/p cath to remain on heparin with IABP placed and awaiting CABG. Per TCTS, will continue with plans for CABG tomorrow morning.  Heparin level subtherapeutic this morning, rate appropriately increased but level is undetectable on repeat. No issues noted with line per RN and infusion has not been off.  Goal of Therapy:  Heparin level 0.2 - 0.5 units/ml Monitor platelets by anticoagulation protocol: Yes   Plan:  -Increase heparin to 1350 units/hr -Check heparin level in 6-hr -Monitor daily heparin level, CBC, S/Sx bleeding  Fredonia Highland, PharmD PGY-2 Cardiology Pharmacy Resident Pager: [phone] 08/27/2017

## 2017-08-27 NOTE — Progress Notes (Signed)
Patient HR up to 110s-130's irregular rate. Paged Dr. Donata Clay and received orders to increase Amiodarone gtt back to 60 mg/hr until surgery tomorrow. Serum potassium level 3.9, additional potassium ordered per Dr. Donata Clay.

## 2017-08-27 NOTE — Progress Notes (Signed)
Dr. Shirlee Latch made aware of patient's rhythm change- at times SR with frequent PACs and PVCs, then bursts of afib or SVT. HR ranging from the 60s-150s. Will give 3 runs of potassium at this time. Will continue to closely monitor. Thresa Ross RN

## 2017-08-27 NOTE — Significant Event (Signed)
Patient is alert and oriented X 4, demonstrates safety measures in return, patient verbalizes understanding, remaining in bed and is not pulling/picking on lines. Bilateral wrist restraints removed. Bed alarm remains on.    Clinton Harrington

## 2017-08-27 NOTE — Progress Notes (Signed)
ANTICOAGULATION CONSULT NOTE - Follow Up Consult  Pharmacy Consult for Heparin Indication: severe 3v CAD; IABP  Allergies  Allergen Reactions  . Aspirin Other (See Comments)    Stomach upset  . Latex Rash  . Oxycodone-Acetaminophen Itching    Patient Measurements: Height:  (180.3 cm) Weight: 208 lb 15.9 oz (94.8 kg) IBW/kg (Calculated) : 75.3   Vital Signs: Temp: 98.5 F (36.9 C) (09/18 1945) Temp Source: Oral (09/18 1945) BP: 140/76 (09/19 0400) Pulse Rate: 80 (09/19 0500)  Labs:  Recent Labs  08/24/17 0644  08/25/17 0422 08/26/17 0456 08/26/17 1610 08/27/17 0414  HGB 13.7  --  13.2 11.6* 11.9* 11.5*  HCT 41.8  --  40.4 36.4* 35.0* 35.3*  PLT 171  --  174 142*  --  137*  LABPROT 14.6  --  14.7  --   --   --   INR 1.15  --  1.15  --   --   --   HEPARINUNFRC  --   < > 0.16* 0.28*  --  0.12*  CREATININE  --   < > 1.63* 1.11 0.90 1.02  TROPONINI 5.00*  --   --   --   --   --   < > = values in this interval not displayed.  Estimated Creatinine Clearance: 87.1 mL/min (by C-G formula based on SCr of 1.02 mg/dL).  Assessment: 65yom presented as a code STEMI, s/p cath found to have severe 3 vessel CAD. IABP was placed and started on heparin pending CABG.   Heparin level subtherapeutic at 0.12 and no infusion issues per RN. CBC stable, no bleeding.   Goal of Therapy:  Heparin level 0.2-0.5 units/ml Monitor platelets by anticoagulation protocol: Yes   Plan:  Increase heparin gtt to 1150 units/hr  Heparin level in 6 hrs Daily heparin level and CBC Monitor for s/s bleeding  York Cerise, PharmD Clinical Pharmacist 08/27/17 5:31 AM

## 2017-08-27 NOTE — Progress Notes (Addendum)
Dr. Shirlee Latch notified of patient's increasing confusing, agitation, and paranoia. Despite repeated attempts at reorientation, and re-education about safety measures, Clinton Harrington continues to pull at equipment and attempts to get out of bed. He says he "is asleep" and "this is not real" and he "can't take this and wants to leave." Currently patient has on mittens to keep him from pulling out balloon pump, and this RN is sitting at bedside. Order received for Precedex drip. Will continue to reorient and provide for patient safety. Thresa Ross RN

## 2017-08-27 NOTE — Progress Notes (Signed)
Progress Note  Patient Name: Clinton Harrington Date of Encounter: 08/27/2017  Primary Cardiologist: Nanetta Batty MD  Subjective   Patient more confused last night. Trying to get out of bed. Now on Precedex and is fully oriented.  Denies chest pain or SOB. No leg pain.  Inpatient Medications    Scheduled Meds: . aspirin EC  81 mg Oral Daily  . atorvastatin  80 mg Oral q1800  . brimonidine  1 drop Right Eye BID  . Chlorhexidine Gluconate Cloth  6 each Topical Daily  . docusate sodium  200 mg Oral Daily  . feeding supplement (GLUCERNA SHAKE)  237 mL Oral TID WC  . furosemide  40 mg Intravenous BID  . insulin aspart  0-20 Units Subcutaneous TID WC  . insulin aspart  0-5 Units Subcutaneous QHS  . mouth rinse  15 mL Mouth Rinse BID  . metoCLOPramide (REGLAN) injection  10 mg Intravenous Q6H  . metolazone  5 mg Oral Once  . mupirocin ointment  1 application Nasal BID  . nicotine  21 mg Transdermal Q24H  . pantoprazole (PROTONIX) IV  40 mg Intravenous Q12H  . sodium chloride flush  10-40 mL Intracatheter Q12H  . sodium chloride flush  3 mL Intravenous Q12H  . traZODone  100 mg Oral QHS   Continuous Infusions: . sodium chloride 250 mL (08/26/17 1900)  . amiodarone 60 mg/hr (08/27/17 0902)  . amiodarone    . amiodarone    . amiodarone    . dexmedetomidine (PRECEDEX) IV infusion 0.1 mcg/kg/hr (08/27/17 0800)  . heparin 1,150 Units/hr (08/27/17 0800)  . milrinone 0.25 mcg/kg/min (08/27/17 0700)  . norepinephrine (LEVOPHED) Adult infusion Stopped (08/25/17 0400)  . potassium chloride 10 mEq (08/27/17 0840)  . potassium chloride     PRN Meds: sodium chloride, acetaminophen, ALPRAZolam, alum & mag hydroxide-simeth, Influenza vac split quadrivalent PF, LORazepam, nitroGLYCERIN, ondansetron (ZOFRAN) IV, sodium chloride flush, sodium chloride flush   Vital Signs    Vitals:   08/27/17 0738 08/27/17 0745 08/27/17 0751 08/27/17 0800  BP: (!) 82/56  (!) 82/56 99/75  Pulse:  67 60  (!) 33  Resp:  (!) 32 (!) 32 (!) 31  Temp:  99.3 F (37.4 C)    TempSrc:  Oral    SpO2:  95% 91% 96%  Weight:      Height:        Intake/Output Summary (Last 24 hours) at 08/27/17 0908 Last data filed at 08/27/17 0800  Gross per 24 hour  Intake           822.35 ml  Output             3471 ml  Net         -2648.65 ml   Filed Weights   08/23/17 1754 08/25/17 0600 08/26/17 0500  Weight: 200 lb (90.7 kg) 208 lb 1.8 oz (94.4 kg) 208 lb 15.9 oz (94.8 kg)    Telemetry    Junctional rhythm. Was in Afib with RVR earlier this am- converted after IV amiodarone - Personally Reviewed  ECG    Afib with RVR rate 108. Inferior infarct with Q waves. Lateral ST depression - Personally Reviewed  Physical Exam   GEN: overweight WM, No acute distress. Appears calm.  Neck: difficult to assess JVD with thick neck Cardiac: RRR, no murmurs, rubs, or gallops.  Respiratory: Clear to auscultation bilaterally. GI: Soft, nontender, non-distended  MS: No edema; Right groin with IABP- no hematoma. Right foot warm and  dry with palpable pulse. Good left femoral pulse. Left foot cool but perfused.  Neuro:  Nonfocal  Psych: Normal affect   Labs    Chemistry  Recent Labs Lab 08/25/17 0422 08/26/17 0456 08/26/17 1610 08/27/17 0414  NA 135 136 139 136  K 4.0 3.9 3.6 3.5  CL 101 100* 97* 97*  CO2 26 28  --  31  GLUCOSE 217* 175* 195* 183*  BUN 30* 24* 24* 16  CREATININE 1.63* 1.11 0.90 1.02  CALCIUM 8.7* 8.7*  --  8.8*  PROT 6.6 6.4*  --  6.6  ALBUMIN 3.3* 3.2*  --  3.1*  AST 74* 51*  --  40  ALT 26 25  --  27  ALKPHOS 53 50  --  54  BILITOT 0.4 0.6  --  0.6  GFRNONAA 43* >60  --  >60  GFRAA 50* >60  --  >60  ANIONGAP 8 8  --  8     Hematology  Recent Labs Lab 08/25/17 0422 08/26/17 0456 08/26/17 1610 08/27/17 0414  WBC 13.4* 11.3*  --  9.9  RBC 4.46 3.98*  --  3.90*  HGB 13.2 11.6* 11.9* 11.5*  HCT 40.4 36.4* 35.0* 35.3*  MCV 90.6 91.5  --  90.5  MCH 29.6 29.1  --  29.5    MCHC 32.7 31.9  --  32.6  RDW 14.3 14.0  --  13.8  PLT 174 142*  --  137*    Cardiac Enzymes  Recent Labs Lab 08/23/17 1724 08/23/17 1927 08/24/17 0027 08/24/17 0644  TROPONINI 1.95* 2.63* 3.76* 5.00*   No results for input(s): TROPIPOC in the last 168 hours.   BNP  Recent Labs Lab 08/23/17 1927  BNP 200.0*     DDimer No results for input(s): DDIMER in the last 168 hours.   Radiology    Dg Chest Port 1 View  Result Date: 08/27/2017 CLINICAL DATA:  CHF, balloon pump EXAM: PORTABLE CHEST 1 VIEW COMPARISON:  08/26/2017 FINDINGS: Entry of balloon pump tip remains projecting over the proximal descending thoracic aorta. Right PICC line tip in the right atrium. Cardiomegaly with bilateral airspace opacities, right greater than left, likely mild asymmetric edema. Suspect small layering right effusion. IMPRESSION: IABP remains in stable position. Slight increase bilateral airspace opacities, right worse than left, likely asymmetric edema. Electronically Signed   By: Charlett Nose M.D.   On: 08/27/2017 08:48   Dg Chest Port 1 View  Result Date: 08/26/2017 CLINICAL DATA:  MI.  Balloon pump. EXAM: PORTABLE CHEST 1 VIEW COMPARISON:  08/25/2017 . FINDINGS: Right PICC line new in stable position. Balloon pump tip in stable position in the upper descending thoracic aorta below the arch. Cardiomegaly. Mild bilateral interstitial prominence, particular on the right consistent CHF. No interim change. Small right pleural effusion cannot be excluded. No pneumothorax P IMPRESSION: 1.  Right PICC line and aortic balloon pump in stable position. 2. Findings consistent with congestive heart failure with mild interstitial edema, primarily on the right. Small right pleural effusion. No change from prior exam. Electronically Signed   By: Maisie Fus  Register   On: 08/26/2017 07:54    Cardiac Studies   Echo: 08/23/17: Study Conclusions  - Left ventricle: The cavity size was normal. Wall thickness was    increased in a pattern of moderate LVH. Systolic function was   vigorous. The estimated ejection fraction was in the range of 65%   to 70%. Wall motion was normal; there were no regional wall  motion abnormalities. The study is not technically sufficient to   allow evaluation of LV diastolic function. - Aortic valve: Mildly calcified annulus. Trileaflet; normal   thickness leaflets. Valve area (VTI): 1.95 cm^2. Valve area   (Vmax): 1.73 cm^2. - Right ventricle: The cavity size was moderately dilated. The RV   shares the apex with the LV. Systolic function was moderately to   severely reduced. The RV free wall is hypokinetic. - Inferior vena cava: The vessel was dilated. The respirophasic   diameter changes were blunted (< 50%), consistent with elevated   central venous pressure. - Pericardium, extracardiac: There is a trivial circumferential   pericardial effusion Echodensity attached to the RV apex looks   most consistent with fat pad as it is adherent to the muscle wall   and moves in coordination.  Cardiac cath: 08/23/17: Conclusion     Prox RCA to Mid RCA lesion, 100 %stenosed.  Ost LAD lesion, 90 %stenosed.  Mid LAD lesion, 75 %stenosed.  1st Diag lesion, 75 %stenosed.   Clinton Harrington is a 66 y.o. male    [id number] LOCATION:  FACILITY: [facility]  PHYSICIAN: Nanetta Batty, M.D. 07/18/1951   DATE OF PROCEDURE:  08/23/2017  DATE OF DISCHARGE:     CARDIAC CATHETERIZATION / IABP    History obtained from chart review. Mr. Lile is a 66 year old mild to moderately overweight Caucasian male formerly a patient of Dr. Kandis Cocking and subsequently Dr. Anola Gurney. He has a history of CAD status post myocardial function 1999 with RCA stenting. At that time he had minimal disease in his LAD. He does continue to smoke and history of hypertension, hyperlipidemia and diabetes. He did have critical limb ischemia on the left with a left femoropopliteal bypass grafting. He  developed chest pain at 11:00 last night which persisted throughout the night and day today finally arriving at the emergency room late this afternoon with EKG showed subtle anterior ST segment elevation, right bundle branch block which was old. His initial blood pressure at home was 60 mmHg and when he arrived at the emergency room 80 mmHg. He did receive a fluid bolus. It was decided to bring him to the Cath Lab for urgent cath and potential intervention as well as hemodynamic support.    IMPRESSION: Mr. Hinderman has severe three-vessel disease with a highly calcified ostial LAD, to the right with left right collaterals and needs AV groove circumflex disease. He has moderately severe renal insufficiency which may be related to ATN from shock. His EDP was only 15. His blood pressure was marginal at between 95 and 100 and I therefore elected to place an intra-aortic balloon pump for hemodynamic support. His pain improved by the end of the procedure. He was on aspirin and Plavix at home. His Plavix has been discontinued. The echo has been ordered. I placed him on IV heparin. He will need coronary artery bypass grafting midweek. He left the lab in stable condition. I have discussed this case with Dr. Kathlee Nations Trigt. Nanetta Batty. MD, [facility] 08/23/2017 6:49 PM      Indications   Acute ST elevation myocardial infarction (STEMI) involving left anterior descending (LAD) coronary artery (HCC) [I21.02 (ICD-10-CM)]  Cardiogenic shock (HCC) [R57.0 (ICD-10-CM)]  Procedural Details/Technique   Technical Details PROCEDURE DESCRIPTION:   The patient was brought to the second floor [facility] cath lab in the postabsorptive state. He was not premedicated . His right groin was prepped and shaved in usual sterile fashion. Xylocaine 1%  was used for local anesthesia. A 6 upgraded to a 7 [place] sheath was inserted into the Right common femoral artery using standard Seldinger technique. A 5 French  left Judkins catheters along with a 5 French pigtail catheter were used for selective coronary angiography, so selective left internal mammary artery angiography, distal abdominal aortography and left heart pressures. Isovue was used for the entirety of the case. Retrograde aortic, left ventricular and pullback pressures were recorded. An intra-aortic balloon pump was placed via the right femoral artery.   Estimated blood loss <50 mL.  During this procedure no sedation was administered.    Coronary Findings   Dominance: Right  Left Anterior Descending  Ost LAD lesion, 90% stenosed. The lesion is concentric. The lesion is calcified.  Mid LAD lesion, 75% stenosed.  First Diagonal Branch  1st Diag lesion, 75% stenosed.  Right Coronary Artery  Prox RCA to Mid RCA lesion, 100% stenosed. The lesion was previously treated.  Right Posterior Descending Artery  Ost RPDA filled by collaterals from [facility] 1st Sept.  Impella/IABP   Hemodynamic Support An IABP was inserted for hemodynamic support in the setting of cardiogenic shock. Access site: right femoral artery.    Coronary Diagrams   Diagnostic Diagram          Patient Profile     66 y.o. male with history of remote inferior MI 1999 with stenting of the mid RCA, PVD with left fem-pop bypass, tobacco abuse, HTN, HLD, and DM type 2 presented with acute inferior infarct associated with cardiogenic shock, RV infarct, junctional rhythm.  Assessment & Plan    1. Acute RV infarct/STEMI - ST elevation V1-3. Troponin peak 5.0. Cardiac cath showed Occluded RCA proximally with collaterals. The LAD was severely diseased in the proximal and mid vessel with heavy calcification. Also moderate disease in proximal LCx extending into OM1 and OM2. PCI not attempted. Evaluated by Dr. Donata Clay and plan for CABG Thursday. Plavix on hold. On IV heparin and ASA. On high dose statin. Need to let RV function recover as much as possible to tolerate being on  bypass. 2. Cardiogenic shock due to RV infarct. Echo reviewed personally. The basal inferior wall is akinetic. Otherwise LV function looks good. The RV is akinetic. On support with IV milrinone and IABP. Weaned off norepinephrine Sunday. Continue IABP until time of surgery.  3. Atrial fibrillation with RVR (new) alternating with Junctional rhythm. Loading with IV amiodarone and will continue IV drip.  Avoid beta blocker with hypotension. 4. Acute renal failure. Creatinine improved 2.63>>1.6>>1.11>>1.02. Good urine output.  5. Acute diastolic CHF. CXR shows some pulmonary edema. Excellent response to diuretics with negative fluid balance. I/O negative 2866 cc yesterday. Continue IABP and milrinone. 6. PAD s/p left fem-pop BPG. 7. DM type 2. A1c 6%. On SSI 8. Hypercholesterolemia. On high dose statin. 9. Confusion- suspect related to ICU and sundowning. Continue Precedex.   The patient is critically ill with multiple organ systems failure and requires high complexity decision making for assessment and support, frequent evaluation and titration of therapies, application of advanced monitoring technologies and extensive interpretation of multiple databases.  For questions or updates, please contact [facility] Please consult [email or web address] for contact info under Cardiology/STEMI.      Signed, Doyt Castellana [place], MD  08/27/2017, 9:08 AM

## 2017-08-28 ENCOUNTER — Encounter (HOSPITAL_COMMUNITY): Payer: Self-pay | Admitting: Certified Registered Nurse Anesthetist

## 2017-08-28 ENCOUNTER — Inpatient Hospital Stay (HOSPITAL_COMMUNITY): Payer: PPO

## 2017-08-28 ENCOUNTER — Inpatient Hospital Stay (HOSPITAL_COMMUNITY): Payer: PPO | Admitting: Certified Registered Nurse Anesthetist

## 2017-08-28 ENCOUNTER — Encounter (HOSPITAL_COMMUNITY)
Admission: EM | Disposition: A | Discharge status: Nursing Home (Includes ICF) | Payer: Self-pay | Source: Home / Self Care | Attending: Cardiothoracic Surgery

## 2017-08-28 HISTORY — PX: PLACEMENT OF CENTRIMAG VENTRICULAR ASSIST DEVICE: SHX6226

## 2017-08-28 HISTORY — PX: CORONARY ARTERY BYPASS GRAFT: SHX141

## 2017-08-28 HISTORY — PX: TEE WITHOUT CARDIOVERSION: SHX5443

## 2017-08-28 LAB — POCT I-STAT, CHEM 8
BUN: 26 mg/dL — AB (ref 6–20)
BUN: 25 mg/dL — AB (ref 6–20)
BUN: 24 mg/dL — ABNORMAL HIGH (ref 6–20)
BUN: 23 mg/dL — ABNORMAL HIGH (ref 6–20)
BUN: 24 mg/dL — ABNORMAL HIGH (ref 6–20)
BUN: 23 mg/dL — ABNORMAL HIGH (ref 6–20)
CALCIUM ION: 1.17 mmol/L (ref 1.15–1.40)
CALCIUM ION: 1.04 mmol/L — AB (ref 1.15–1.40)
CALCIUM ION: 1.04 mmol/L — AB (ref 1.15–1.40)
CALCIUM ION: 1.04 mmol/L — AB (ref 1.15–1.40)
CHLORIDE: 90 mmol/L — AB (ref 101–111)
CREATININE: 1 mg/dL (ref 0.61–1.24)
CREATININE: 0.9 mg/dL (ref 0.61–1.24)
CREATININE: 1 mg/dL (ref 0.61–1.24)
Calcium, Ion: 1.08 mmol/L — ABNORMAL LOW (ref 1.15–1.40)
Calcium, Ion: 1 mmol/L — ABNORMAL LOW (ref 1.15–1.40)
Chloride: 92 mmol/L — ABNORMAL LOW (ref 101–111)
Chloride: 91 mmol/L — ABNORMAL LOW (ref 101–111)
Chloride: 90 mmol/L — ABNORMAL LOW (ref 101–111)
Chloride: 91 mmol/L — ABNORMAL LOW (ref 101–111)
Chloride: 92 mmol/L — ABNORMAL LOW (ref 101–111)
Creatinine, Ser: 1.1 mg/dL (ref 0.61–1.24)
Creatinine, Ser: 0.9 mg/dL (ref 0.61–1.24)
Creatinine, Ser: 1 mg/dL (ref 0.61–1.24)
GLUCOSE: 202 mg/dL — AB (ref 65–99)
GLUCOSE: 175 mg/dL — AB (ref 65–99)
GLUCOSE: 170 mg/dL — AB (ref 65–99)
GLUCOSE: 169 mg/dL — AB (ref 65–99)
Glucose, Bld: 188 mg/dL — ABNORMAL HIGH (ref 65–99)
Glucose, Bld: 183 mg/dL — ABNORMAL HIGH (ref 65–99)
HCT: 34 % — ABNORMAL LOW (ref 39.0–52.0)
HCT: 31 % — ABNORMAL LOW (ref 39.0–52.0)
HCT: 23 % — ABNORMAL LOW (ref 39.0–52.0)
HCT: 26 % — ABNORMAL LOW (ref 39.0–52.0)
HEMATOCRIT: 32 % — AB (ref 39.0–52.0)
HEMATOCRIT: 28 % — AB (ref 39.0–52.0)
HEMOGLOBIN: 7.8 g/dL — AB (ref 13.0–17.0)
HEMOGLOBIN: 8.8 g/dL — AB (ref 13.0–17.0)
HEMOGLOBIN: 9.5 g/dL — AB (ref 13.0–17.0)
Hemoglobin: 11.6 g/dL — ABNORMAL LOW (ref 13.0–17.0)
Hemoglobin: 10.9 g/dL — ABNORMAL LOW (ref 13.0–17.0)
Hemoglobin: 10.5 g/dL — ABNORMAL LOW (ref 13.0–17.0)
Potassium: 4.2 mmol/L (ref 3.5–5.1)
Potassium: 4 mmol/L (ref 3.5–5.1)
Potassium: 4.3 mmol/L (ref 3.5–5.1)
Potassium: 4 mmol/L (ref 3.5–5.1)
Potassium: 4.7 mmol/L (ref 3.5–5.1)
Potassium: 4 mmol/L (ref 3.5–5.1)
SODIUM: 133 mmol/L — AB (ref 135–145)
SODIUM: 134 mmol/L — AB (ref 135–145)
Sodium: 133 mmol/L — ABNORMAL LOW (ref 135–145)
Sodium: 131 mmol/L — ABNORMAL LOW (ref 135–145)
Sodium: 132 mmol/L — ABNORMAL LOW (ref 135–145)
Sodium: 133 mmol/L — ABNORMAL LOW (ref 135–145)
TCO2: 32 mmol/L (ref 22–32)
TCO2: 30 mmol/L (ref 22–32)
TCO2: 31 mmol/L (ref 22–32)
TCO2: 31 mmol/L (ref 22–32)
TCO2: 29 mmol/L (ref 22–32)
TCO2: 29 mmol/L (ref 22–32)

## 2017-08-28 LAB — CBC
HCT: 36 % — ABNORMAL LOW (ref 39.0–52.0)
HCT: 27.5 % — ABNORMAL LOW (ref 39.0–52.0)
HEMATOCRIT: 28.2 % — AB (ref 39.0–52.0)
HEMOGLOBIN: 11.9 g/dL — AB (ref 13.0–17.0)
HEMOGLOBIN: 9.4 g/dL — AB (ref 13.0–17.0)
Hemoglobin: 9.4 g/dL — ABNORMAL LOW (ref 13.0–17.0)
MCH: 29.5 pg (ref 26.0–34.0)
MCH: 29.5 pg (ref 26.0–34.0)
MCH: 30.1 pg (ref 26.0–34.0)
MCHC: 33.1 g/dL (ref 30.0–36.0)
MCHC: 33.3 g/dL (ref 30.0–36.0)
MCHC: 34.2 g/dL (ref 30.0–36.0)
MCV: 89.3 fL (ref 78.0–100.0)
MCV: 88.4 fL (ref 78.0–100.0)
MCV: 88.1 fL (ref 78.0–100.0)
Platelets: 148 10*3/uL — ABNORMAL LOW (ref 150–400)
Platelets: 168 10*3/uL (ref 150–400)
Platelets: 156 10*3/uL (ref 150–400)
RBC: 4.03 MIL/uL — ABNORMAL LOW (ref 4.22–5.81)
RBC: 3.19 MIL/uL — ABNORMAL LOW (ref 4.22–5.81)
RBC: 3.12 MIL/uL — ABNORMAL LOW (ref 4.22–5.81)
RDW: 13.5 % (ref 11.5–15.5)
RDW: 13.3 % (ref 11.5–15.5)
RDW: 13.6 % (ref 11.5–15.5)
WBC: 9.7 10*3/uL (ref 4.0–10.5)
WBC: 12.5 10*3/uL — ABNORMAL HIGH (ref 4.0–10.5)
WBC: 12.9 10*3/uL — ABNORMAL HIGH (ref 4.0–10.5)

## 2017-08-28 LAB — POCT I-STAT 3, ART BLOOD GAS (G3+)
ACID-BASE EXCESS: 10 mmol/L — AB (ref 0.0–2.0)
ACID-BASE EXCESS: 5 mmol/L — AB (ref 0.0–2.0)
Acid-Base Excess: 5 mmol/L — ABNORMAL HIGH (ref 0.0–2.0)
Acid-Base Excess: 6 mmol/L — ABNORMAL HIGH (ref 0.0–2.0)
BICARBONATE: 34.9 mmol/L — AB (ref 20.0–28.0)
BICARBONATE: 30.7 mmol/L — AB (ref 20.0–28.0)
BICARBONATE: 31.7 mmol/L — AB (ref 20.0–28.0)
Bicarbonate: 30.3 mmol/L — ABNORMAL HIGH (ref 20.0–28.0)
O2 SAT: 100 %
O2 SAT: 96 %
O2 Saturation: 98 %
O2 Saturation: 100 %
PCO2 ART: 44.4 mmHg (ref 32.0–48.0)
PCO2 ART: 47.8 mmHg (ref 32.0–48.0)
PH ART: 7.424 (ref 7.350–7.450)
PH ART: 7.44 (ref 7.350–7.450)
PO2 ART: 72 mmHg — AB (ref 83.0–108.0)
PO2 ART: 174 mmHg — AB (ref 83.0–108.0)
Patient temperature: 35.2
TCO2: 36 mmol/L — ABNORMAL HIGH (ref 22–32)
TCO2: 32 mmol/L (ref 22–32)
TCO2: 32 mmol/L (ref 22–32)
TCO2: 33 mmol/L — ABNORMAL HIGH (ref 22–32)
pCO2 arterial: 49.4 mmHg — ABNORMAL HIGH (ref 32.0–48.0)
pCO2 arterial: 46.4 mmHg (ref 32.0–48.0)
pH, Arterial: 7.457 — ABNORMAL HIGH (ref 7.350–7.450)
pH, Arterial: 7.427 (ref 7.350–7.450)
pO2, Arterial: 393 mmHg — ABNORMAL HIGH (ref 83.0–108.0)
pO2, Arterial: 100 mmHg (ref 83.0–108.0)

## 2017-08-28 LAB — COMPREHENSIVE METABOLIC PANEL
ALT: 25 U/L (ref 17–63)
AST: 27 U/L (ref 15–41)
Albumin: 3.1 g/dL — ABNORMAL LOW (ref 3.5–5.0)
Alkaline Phosphatase: 59 U/L (ref 38–126)
Anion gap: 10 (ref 5–15)
BUN: 20 mg/dL (ref 6–20)
CO2: 32 mmol/L (ref 22–32)
Calcium: 8.9 mg/dL (ref 8.9–10.3)
Chloride: 91 mmol/L — ABNORMAL LOW (ref 101–111)
Creatinine, Ser: 1.04 mg/dL (ref 0.61–1.24)
GFR calc Af Amer: >60 mL/min (ref >60)
GFR calc non Af Amer: >60 mL/min (ref >60)
Glucose, Bld: 208 mg/dL — ABNORMAL HIGH (ref 65–99)
Potassium: 3.9 mmol/L (ref 3.5–5.1)
Sodium: 133 mmol/L — ABNORMAL LOW (ref 135–145)
Total Bilirubin: 0.6 mg/dL (ref 0.3–1.2)
Total Protein: 6.8 g/dL (ref 6.5–8.1)

## 2017-08-28 LAB — POCT I-STAT 4, (NA,K, GLUC, HGB,HCT)
GLUCOSE: 154 mg/dL — AB (ref 65–99)
HEMATOCRIT: 27 % — AB (ref 39.0–52.0)
HEMOGLOBIN: 9.2 g/dL — AB (ref 13.0–17.0)
Potassium: 3.9 mmol/L (ref 3.5–5.1)
Sodium: 136 mmol/L (ref 135–145)

## 2017-08-28 LAB — GLUCOSE, CAPILLARY
GLUCOSE-CAPILLARY: 141 mg/dL — AB (ref 65–99)
GLUCOSE-CAPILLARY: 152 mg/dL — AB (ref 65–99)
GLUCOSE-CAPILLARY: 147 mg/dL — AB (ref 65–99)
Glucose-Capillary: 125 mg/dL — ABNORMAL HIGH (ref 65–99)
Glucose-Capillary: 130 mg/dL — ABNORMAL HIGH (ref 65–99)
Glucose-Capillary: 135 mg/dL — ABNORMAL HIGH (ref 65–99)
Glucose-Capillary: 129 mg/dL — ABNORMAL HIGH (ref 65–99)
Glucose-Capillary: 154 mg/dL — ABNORMAL HIGH (ref 65–99)

## 2017-08-28 LAB — HEMOGLOBIN AND HEMATOCRIT, BLOOD
HCT: 23.8 % — ABNORMAL LOW (ref 39.0–52.0)
Hemoglobin: 8.1 g/dL — ABNORMAL LOW (ref 13.0–17.0)

## 2017-08-28 LAB — PLATELET COUNT: Platelets: 146 10*3/uL — ABNORMAL LOW (ref 150–400)

## 2017-08-28 LAB — DIC (DISSEMINATED INTRAVASCULAR COAGULATION)PANEL
Fibrinogen: 462 mg/dL (ref 210–475)
Prothrombin Time: 16.9 seconds — ABNORMAL HIGH (ref 11.4–15.2)
aPTT: 35 seconds (ref 24–36)

## 2017-08-28 LAB — COOXEMETRY PANEL
CARBOXYHEMOGLOBIN: 0.7 % (ref 0.5–1.5)
Carboxyhemoglobin: 1.4 % (ref 0.5–1.5)
Carboxyhemoglobin: 0.9 % (ref 0.5–1.5)
METHEMOGLOBIN: 1.6 % — AB (ref 0.0–1.5)
Methemoglobin: 1.2 % (ref 0.0–1.5)
Methemoglobin: 1.6 % — ABNORMAL HIGH (ref 0.0–1.5)
O2 SAT: 75.2 %
O2 Saturation: 64.6 %
O2 Saturation: 69 %
TOTAL HEMOGLOBIN: 9.5 g/dL — AB (ref 12.0–16.0)
Total hemoglobin: 12.2 g/dL (ref 12.0–16.0)
Total hemoglobin: 10.5 g/dL — ABNORMAL LOW (ref 12.0–16.0)

## 2017-08-28 LAB — POCT ACTIVATED CLOTTING TIME
Activated Clotting Time: 120 seconds
Activated Clotting Time: 131 seconds

## 2017-08-28 LAB — CREATININE, SERUM
Creatinine, Ser: 1 mg/dL (ref 0.61–1.24)
GFR calc Af Amer: >60 mL/min (ref >60)
GFR calc non Af Amer: >60 mL/min (ref >60)

## 2017-08-28 LAB — DIC (DISSEMINATED INTRAVASCULAR COAGULATION) PANEL
D DIMER QUANT: 0.62 ug{FEU}/mL — AB (ref 0.00–0.50)
INR: 1.39
PLATELETS: 166 10*3/uL (ref 150–400)
SMEAR REVIEW: NONE SEEN

## 2017-08-28 LAB — PREPARE RBC (CROSSMATCH)

## 2017-08-28 LAB — MAGNESIUM: Magnesium: 2.7 mg/dL — ABNORMAL HIGH (ref 1.7–2.4)

## 2017-08-28 SURGERY — CORONARY ARTERY BYPASS GRAFTING (CABG)
Anesthesia: General | Site: Chest | Laterality: Right

## 2017-08-28 MED ORDER — METOCLOPRAMIDE HCL 5 MG/ML IJ SOLN
10.0000 mg | Freq: Four times a day (QID) | INTRAMUSCULAR | Status: AC
  Administered 2017-08-28 – 2017-09-02 (×20): 10 mg via INTRAVENOUS
  Filled 2017-08-28 (×20): qty 2

## 2017-08-28 MED ORDER — ALPRAZOLAM 0.5 MG PO TABS: 0.5000 mg | ORAL_TABLET | Freq: Two times a day (BID) | ORAL | Status: DC | PRN

## 2017-08-28 MED ORDER — POTASSIUM CHLORIDE 10 MEQ/50ML IV SOLN
10.0000 meq | Freq: Once | INTRAVENOUS | Status: AC
  Administered 2017-08-28: 10 meq via INTRAVENOUS
  Filled 2017-08-28: qty 50

## 2017-08-28 MED ORDER — BRIMONIDINE TARTRATE 0.15 % OP SOLN
1.0000 [drp] | Freq: Two times a day (BID) | OPHTHALMIC | Status: DC
  Administered 2017-08-29 – 2017-09-07 (×20): 1 [drp] via OPHTHALMIC
  Filled 2017-08-28: qty 5

## 2017-08-28 MED ORDER — OXYCODONE HCL 5 MG PO TABS: 5.0000 mg | ORAL_TABLET | ORAL | Status: DC | PRN

## 2017-08-28 MED ORDER — SODIUM CHLORIDE 0.9 % IJ SOLN
OROMUCOSAL | Status: DC | PRN
  Administered 2017-08-28 (×3): 4 mL via TOPICAL

## 2017-08-28 MED ORDER — DEXMEDETOMIDINE HCL IN NACL 400 MCG/100ML IV SOLN
0.4000 ug/kg/h | INTRAVENOUS | Status: DC
  Administered 2017-08-28: 1 ug/kg/h via INTRAVENOUS
  Administered 2017-08-29 – 2017-08-31 (×17): 1.2 ug/kg/h via INTRAVENOUS
  Filled 2017-08-28 (×17): qty 100

## 2017-08-28 MED ORDER — FENTANYL CITRATE (PF) 100 MCG/2ML IJ SOLN
25.0000 ug | INTRAMUSCULAR | Status: DC | PRN
  Administered 2017-09-06: 50 ug via INTRAVENOUS

## 2017-08-28 MED ORDER — EPHEDRINE 5 MG/ML INJ
INTRAVENOUS | Status: AC
  Filled 2017-08-28: qty 10

## 2017-08-28 MED ORDER — VASOPRESSIN 20 UNIT/ML IV SOLN
INTRAVENOUS | Status: AC
  Filled 2017-08-28: qty 1

## 2017-08-28 MED ORDER — ARTIFICIAL TEARS OPHTHALMIC OINT
TOPICAL_OINTMENT | OPHTHALMIC | Status: AC
  Filled 2017-08-28: qty 3.5

## 2017-08-28 MED ORDER — ASPIRIN 81 MG PO CHEW
324.0000 mg | CHEWABLE_TABLET | Freq: Every day | ORAL | Status: DC
  Administered 2017-08-29 – 2017-08-30 (×2): 324 mg
  Administered 2017-08-31: 81 mg
  Administered 2017-09-01 – 2017-09-07 (×4): 324 mg
  Filled 2017-08-28 (×9): qty 4

## 2017-08-28 MED ORDER — EPINEPHRINE PF 1 MG/ML IJ SOLN
0.0000 ug/min | INTRAVENOUS | Status: DC
  Administered 2017-08-29: 3 ug/min via INTRAVENOUS
  Administered 2017-08-29 – 2017-09-01 (×5): 4 ug/min via INTRAVENOUS
  Administered 2017-09-02 – 2017-09-04 (×3): 3 ug/min via INTRAVENOUS
  Administered 2017-09-04: 2 ug/min via INTRAVENOUS
  Administered 2017-09-05 – 2017-09-07 (×3): 3 ug/min via INTRAVENOUS
  Administered 2017-09-08: 6 ug/min via INTRAVENOUS
  Administered 2017-09-08: 3 ug/min via INTRAVENOUS
  Administered 2017-09-09 – 2017-09-10 (×4): 6 ug/min via INTRAVENOUS
  Administered 2017-09-11: 4 ug/min via INTRAVENOUS
  Administered 2017-09-12: 2 ug/min via INTRAVENOUS
  Filled 2017-08-28 (×24): qty 4

## 2017-08-28 MED ORDER — ACETAMINOPHEN 160 MG/5ML PO SOLN
1000.0000 mg | Freq: Four times a day (QID) | ORAL | Status: DC
  Administered 2017-08-28 – 2017-08-30 (×7): 1000 mg
  Filled 2017-08-28 (×7): qty 40.6

## 2017-08-28 MED ORDER — PHENYLEPHRINE HCL 10 MG/ML IJ SOLN
INTRAMUSCULAR | Status: DC | PRN
  Administered 2017-08-28 (×2): 40 ug via INTRAVENOUS

## 2017-08-28 MED ORDER — ALBUMIN HUMAN 5 % IV SOLN: 250.0000 mL | INTRAVENOUS | Status: AC | PRN

## 2017-08-28 MED ORDER — PANTOPRAZOLE SODIUM 40 MG PO TBEC: 40.0000 mg | DELAYED_RELEASE_TABLET | Freq: Every day | ORAL | Status: DC

## 2017-08-28 MED ORDER — HEMOSTATIC AGENTS (NO CHARGE) OPTIME
TOPICAL | Status: DC | PRN
  Administered 2017-08-28: 1 via TOPICAL

## 2017-08-28 MED ORDER — FENTANYL 2500MCG IN NS 250ML (10MCG/ML) PREMIX INFUSION
25.0000 ug/h | INTRAVENOUS | Status: DC
  Administered 2017-08-28: 100 ug/h via INTRAVENOUS
  Administered 2017-08-29 (×2): 400 ug/h via INTRAVENOUS
  Administered 2017-08-29: 275 ug/h via INTRAVENOUS
  Administered 2017-08-29 – 2017-08-31 (×7): 400 ug/h via INTRAVENOUS
  Administered 2017-08-31: 200 ug/h via INTRAVENOUS
  Administered 2017-08-31 – 2017-09-02 (×6): 400 ug/h via INTRAVENOUS
  Filled 2017-08-28 (×19): qty 250

## 2017-08-28 MED ORDER — DEXMEDETOMIDINE HCL 200 MCG/2ML IV SOLN
INTRAVENOUS | Status: DC | PRN
  Administered 2017-08-28: 0.2 ug/kg/h via INTRAVENOUS

## 2017-08-28 MED ORDER — SODIUM CHLORIDE 0.45 % IV SOLN
INTRAVENOUS | Status: DC | PRN
  Administered 2017-08-28: 20 mL via INTRAVENOUS
  Administered 2017-08-29: 20 mL/h via INTRAVENOUS

## 2017-08-28 MED ORDER — TRANEXAMIC ACID 1000 MG/10ML IV SOLN
INTRAVENOUS | Status: DC | PRN
  Administered 2017-08-28: 1.5 mg/kg/h via INTRAVENOUS

## 2017-08-28 MED ORDER — METOPROLOL TARTRATE 12.5 MG HALF TABLET: 12.5000 mg | ORAL_TABLET | Freq: Two times a day (BID) | ORAL | Status: DC

## 2017-08-28 MED ORDER — BISACODYL 5 MG PO TBEC: 10.0000 mg | DELAYED_RELEASE_TABLET | Freq: Every day | ORAL | Status: DC

## 2017-08-28 MED ORDER — MILRINONE LACTATE IN DEXTROSE 20-5 MG/100ML-% IV SOLN
0.3000 ug/kg/min | INTRAVENOUS | Status: DC
  Administered 2017-08-28 – 2017-08-30 (×5): 0.3 ug/kg/min via INTRAVENOUS
  Administered 2017-08-31: 0.2 ug/kg/min via INTRAVENOUS
  Filled 2017-08-28 (×6): qty 100

## 2017-08-28 MED ORDER — MORPHINE SULFATE (PF) 4 MG/ML IV SOLN: 1.0000 mg | INTRAVENOUS | Status: DC | PRN

## 2017-08-28 MED ORDER — TRAZODONE HCL 50 MG PO TABS: 100.0000 mg | ORAL_TABLET | Freq: Every day | ORAL | Status: DC

## 2017-08-28 MED ORDER — BISACODYL 10 MG RE SUPP: 10.0000 mg | Freq: Every day | RECTAL | Status: DC | PRN

## 2017-08-28 MED ORDER — PROTAMINE SULFATE 10 MG/ML IV SOLN
INTRAVENOUS | Status: DC | PRN
  Administered 2017-08-28: 50 mg via INTRAVENOUS
  Administered 2017-08-28: 30 mg via INTRAVENOUS
  Administered 2017-08-28: 100 mg via INTRAVENOUS
  Administered 2017-08-28: 90 mg via INTRAVENOUS
  Administered 2017-08-28: 50 mg via INTRAVENOUS

## 2017-08-28 MED ORDER — VANCOMYCIN HCL IN DEXTROSE 1-5 GM/200ML-% IV SOLN
1000.0000 mg | Freq: Once | INTRAVENOUS | Status: AC
  Administered 2017-08-28: 1000 mg via INTRAVENOUS
  Filled 2017-08-28: qty 200

## 2017-08-28 MED ORDER — PHENYLEPHRINE 40 MCG/ML (10ML) SYRINGE FOR IV PUSH (FOR BLOOD PRESSURE SUPPORT)
PREFILLED_SYRINGE | INTRAVENOUS | Status: AC
  Filled 2017-08-28: qty 10

## 2017-08-28 MED ORDER — DEXTROSE 5 % IV SOLN
1.5000 g | Freq: Two times a day (BID) | INTRAVENOUS | Status: DC
  Administered 2017-08-28 – 2017-08-30 (×4): 1.5 g via INTRAVENOUS
  Filled 2017-08-28 (×4): qty 1.5

## 2017-08-28 MED ORDER — SODIUM CHLORIDE 0.9 % IV SOLN
INTRAVENOUS | Status: DC
  Administered 2017-08-28: 5.5 [IU]/h via INTRAVENOUS
  Administered 2017-08-30: 23 [IU]/h via INTRAVENOUS
  Administered 2017-08-30: 17.6 [IU]/h via INTRAVENOUS
  Administered 2017-08-30: 21.1 [IU]/h via INTRAVENOUS
  Administered 2017-08-30: 14.6 [IU]/h via INTRAVENOUS
  Administered 2017-08-31: 28.7 [IU]/h via INTRAVENOUS
  Administered 2017-08-31: 15 [IU]/h via INTRAVENOUS
  Administered 2017-08-31: 31.1 [IU]/h via INTRAVENOUS
  Administered 2017-09-01: 17 [IU]/h via INTRAVENOUS
  Administered 2017-09-01: 36.9 [IU]/h via INTRAVENOUS
  Administered 2017-09-01: 29.8 [IU]/h via INTRAVENOUS
  Administered 2017-09-01: 7 [IU]/h via INTRAVENOUS
  Administered 2017-09-02: 9.4 [IU]/h via INTRAVENOUS
  Administered 2017-09-02: 40.2 [IU]/h via INTRAVENOUS
  Administered 2017-09-02 – 2017-09-03 (×2): via INTRAVENOUS
  Administered 2017-09-03: 23.3 [IU]/h via INTRAVENOUS
  Administered 2017-09-03: 32.1 [IU]/h via INTRAVENOUS
  Administered 2017-09-03: 20:00:00 via INTRAVENOUS
  Administered 2017-09-03: 18.9 [IU]/h via INTRAVENOUS
  Administered 2017-09-03: 23:00:00 via INTRAVENOUS
  Administered 2017-09-04: 16.5 [IU]/h via INTRAVENOUS
  Administered 2017-09-04 – 2017-09-05 (×3): via INTRAVENOUS
  Administered 2017-09-05: 8.1 [IU]/h via INTRAVENOUS
  Administered 2017-09-05: 20:00:00 via INTRAVENOUS
  Administered 2017-09-06: 11.7 [IU]/h via INTRAVENOUS
  Administered 2017-09-07: 2.1 [IU]/h via INTRAVENOUS
  Filled 2017-08-28 (×35): qty 1

## 2017-08-28 MED ORDER — BISACODYL 10 MG RE SUPP
10.0000 mg | Freq: Every day | RECTAL | Status: DC
  Administered 2017-08-29: 10 mg via RECTAL
  Filled 2017-08-28: qty 1

## 2017-08-28 MED ORDER — SODIUM CHLORIDE 0.9% FLUSH
3.0000 mL | Freq: Two times a day (BID) | INTRAVENOUS | Status: DC
  Administered 2017-08-29 – 2017-09-07 (×16): 3 mL via INTRAVENOUS

## 2017-08-28 MED ORDER — MAGNESIUM SULFATE 4 GM/100ML IV SOLN
4.0000 g | Freq: Once | INTRAVENOUS | Status: AC
  Administered 2017-08-28: 4 g via INTRAVENOUS
  Filled 2017-08-28: qty 100

## 2017-08-28 MED ORDER — INSULIN REGULAR BOLUS VIA INFUSION
0.0000 [IU] | Freq: Three times a day (TID) | INTRAVENOUS | Status: DC
  Filled 2017-08-28: qty 10

## 2017-08-28 MED ORDER — HEPARIN SODIUM (PORCINE) 1000 UNIT/ML IJ SOLN
INTRAMUSCULAR | Status: AC
  Filled 2017-08-28: qty 1

## 2017-08-28 MED ORDER — ROCURONIUM BROMIDE 100 MG/10ML IV SOLN
INTRAVENOUS | Status: DC | PRN
  Administered 2017-08-28: 50 mg via INTRAVENOUS
  Administered 2017-08-28: 30 mg via INTRAVENOUS
  Administered 2017-08-28 (×2): 50 mg via INTRAVENOUS
  Administered 2017-08-28: 70 mg via INTRAVENOUS
  Administered 2017-08-28: 50 mg via INTRAVENOUS

## 2017-08-28 MED ORDER — LACTATED RINGERS IV SOLN: 500.0000 mL | Freq: Once | INTRAVENOUS | Status: DC | PRN

## 2017-08-28 MED ORDER — PROPOFOL 10 MG/ML IV BOLUS
INTRAVENOUS | Status: AC
  Filled 2017-08-28: qty 40

## 2017-08-28 MED ORDER — MORPHINE SULFATE (PF) 4 MG/ML IV SOLN
INTRAVENOUS | Status: AC
  Filled 2017-08-28: qty 1

## 2017-08-28 MED ORDER — ROCURONIUM BROMIDE 10 MG/ML (PF) SYRINGE
PREFILLED_SYRINGE | INTRAVENOUS | Status: AC
  Filled 2017-08-28: qty 5

## 2017-08-28 MED ORDER — TRAMADOL HCL 50 MG PO TABS: 50.0000 mg | ORAL_TABLET | ORAL | Status: DC | PRN

## 2017-08-28 MED ORDER — CHLORHEXIDINE GLUCONATE CLOTH 2 % EX PADS
6.0000 | MEDICATED_PAD | Freq: Every day | CUTANEOUS | Status: DC
  Administered 2017-08-29 – 2017-09-08 (×12): 6 via TOPICAL

## 2017-08-28 MED ORDER — FENTANYL CITRATE (PF) 100 MCG/2ML IJ SOLN
50.0000 ug | Freq: Once | INTRAMUSCULAR | Status: AC
  Administered 2017-08-28: 50 ug via INTRAVENOUS

## 2017-08-28 MED ORDER — CHLORHEXIDINE GLUCONATE 0.12 % MT SOLN
15.0000 mL | OROMUCOSAL | Status: AC
  Administered 2017-08-28: 15 mL via OROMUCOSAL

## 2017-08-28 MED ORDER — FENTANYL CITRATE (PF) 250 MCG/5ML IJ SOLN
INTRAMUSCULAR | Status: DC | PRN
  Administered 2017-08-28 (×5): 250 ug via INTRAVENOUS

## 2017-08-28 MED ORDER — ASPIRIN EC 325 MG PO TBEC: 325.0000 mg | DELAYED_RELEASE_TABLET | Freq: Every day | ORAL | Status: DC

## 2017-08-28 MED ORDER — SODIUM CHLORIDE 0.9 % IV SOLN
Freq: Once | INTRAVENOUS | Status: AC
  Administered 2017-08-28: 20:00:00 via INTRAVENOUS

## 2017-08-28 MED ORDER — LACTATED RINGERS IV SOLN
INTRAVENOUS | Status: DC | PRN
  Administered 2017-08-28 (×2): via INTRAVENOUS

## 2017-08-28 MED ORDER — NITROGLYCERIN IN D5W 200-5 MCG/ML-% IV SOLN: 2.0000 ug/min | INTRAVENOUS | Status: AC

## 2017-08-28 MED ORDER — POTASSIUM CHLORIDE 10 MEQ/50ML IV SOLN
10.0000 meq | INTRAVENOUS | Status: AC
  Administered 2017-08-28 (×3): 10 meq via INTRAVENOUS

## 2017-08-28 MED ORDER — EPHEDRINE SULFATE 50 MG/ML IJ SOLN
INTRAMUSCULAR | Status: DC | PRN
Start: 2017-08-28 — End: 2017-08-28
  Administered 2017-08-28 (×2): 10 mg via INTRAVENOUS

## 2017-08-28 MED ORDER — LACTATED RINGERS IV SOLN
INTRAVENOUS | Status: DC | PRN
  Administered 2017-08-28: 08:00:00 via INTRAVENOUS

## 2017-08-28 MED ORDER — FENTANYL CITRATE (PF) 250 MCG/5ML IJ SOLN
INTRAMUSCULAR | Status: AC
  Filled 2017-08-28: qty 25

## 2017-08-28 MED ORDER — LACTATED RINGERS IV SOLN
INTRAVENOUS | Status: DC | PRN
  Administered 2017-08-28 (×2): via INTRAVENOUS

## 2017-08-28 MED ORDER — SODIUM CHLORIDE 0.9 % IV SOLN
0.0000 ug/kg/h | INTRAVENOUS | Status: DC
  Filled 2017-08-28: qty 2

## 2017-08-28 MED ORDER — SODIUM CHLORIDE 0.9% FLUSH: 3.0000 mL | INTRAVENOUS | Status: DC | PRN

## 2017-08-28 MED ORDER — PROPOFOL 10 MG/ML IV BOLUS
INTRAVENOUS | Status: DC | PRN
  Administered 2017-08-28: 50 mg via INTRAVENOUS

## 2017-08-28 MED ORDER — CHLORHEXIDINE GLUCONATE 0.12% ORAL RINSE (MEDLINE KIT)
15.0000 mL | Freq: Two times a day (BID) | OROMUCOSAL | Status: DC
  Administered 2017-08-28 – 2017-09-07 (×21): 15 mL via OROMUCOSAL

## 2017-08-28 MED ORDER — DEXTROSE 5 % IV SOLN
0.0000 ug/min | INTRAVENOUS | Status: DC
  Administered 2017-08-29: 10 ug/min via INTRAVENOUS
  Administered 2017-08-29: 9 ug/min via INTRAVENOUS
  Administered 2017-08-30: 10 ug/min via INTRAVENOUS
  Filled 2017-08-28 (×3): qty 4

## 2017-08-28 MED ORDER — ROCURONIUM BROMIDE 10 MG/ML (PF) SYRINGE
PREFILLED_SYRINGE | INTRAVENOUS | Status: AC
  Filled 2017-08-28: qty 10

## 2017-08-28 MED ORDER — SODIUM CHLORIDE 0.9 % IV SOLN
INTRAVENOUS | Status: DC
  Administered 2017-08-31: 20 mL/h via INTRAVENOUS
  Administered 2017-08-31: 10 mL/h via INTRAVENOUS
  Administered 2017-09-01: 10 mL via INTRAVENOUS

## 2017-08-28 MED ORDER — SODIUM CHLORIDE 0.9 % IV SOLN
INTRAVENOUS | Status: DC | PRN
  Administered 2017-08-28: 1 [IU]/h via INTRAVENOUS

## 2017-08-28 MED ORDER — FENTANYL CITRATE (PF) 100 MCG/2ML IJ SOLN
INTRAMUSCULAR | Status: AC
  Administered 2017-08-28: 50 ug
  Filled 2017-08-28: qty 2

## 2017-08-28 MED ORDER — MILRINONE LACTATE IN DEXTROSE 20-5 MG/100ML-% IV SOLN
0.1250 ug/kg/min | INTRAVENOUS | Status: DC
  Filled 2017-08-28: qty 100

## 2017-08-28 MED ORDER — ALBUMIN HUMAN 5 % IV SOLN
INTRAVENOUS | Status: DC | PRN
  Administered 2017-08-28 (×2): via INTRAVENOUS

## 2017-08-28 MED ORDER — DOCUSATE SODIUM 100 MG PO CAPS: 200.0000 mg | ORAL_CAPSULE | Freq: Every day | ORAL | Status: DC

## 2017-08-28 MED ORDER — MIDAZOLAM HCL 10 MG/2ML IJ SOLN
INTRAMUSCULAR | Status: AC
  Filled 2017-08-28: qty 2

## 2017-08-28 MED ORDER — ATORVASTATIN CALCIUM 80 MG PO TABS
80.0000 mg | ORAL_TABLET | Freq: Every day | ORAL | Status: DC
  Administered 2017-08-29: 80 mg via ORAL
  Filled 2017-08-28: qty 1

## 2017-08-28 MED ORDER — FENTANYL BOLUS VIA INFUSION
50.0000 ug | INTRAVENOUS | Status: DC | PRN
  Administered 2017-08-28 – 2017-09-04 (×8): 50 ug via INTRAVENOUS
  Administered 2017-09-05: 25 ug via INTRAVENOUS
  Administered 2017-09-05: 50 ug via INTRAVENOUS
  Administered 2017-09-05: 25 ug via INTRAVENOUS
  Filled 2017-08-28: qty 50

## 2017-08-28 MED ORDER — METOPROLOL TARTRATE 25 MG/10 ML ORAL SUSPENSION: 12.5000 mg | Freq: Two times a day (BID) | ORAL | Status: DC

## 2017-08-28 MED ORDER — ONDANSETRON HCL 4 MG/2ML IJ SOLN: 4.0000 mg | Freq: Four times a day (QID) | INTRAMUSCULAR | Status: DC | PRN

## 2017-08-28 MED ORDER — HEPARIN SODIUM (PORCINE) 1000 UNIT/ML IJ SOLN
INTRAMUSCULAR | Status: DC | PRN
  Administered 2017-08-28: 2 mL via INTRAVENOUS
  Administered 2017-08-28: 30 mL via INTRAVENOUS

## 2017-08-28 MED ORDER — LACTATED RINGERS IV SOLN: INTRAVENOUS | Status: DC

## 2017-08-28 MED ORDER — AMIODARONE HCL IN DEXTROSE 360-4.14 MG/200ML-% IV SOLN: 60.0000 mg/h | INTRAVENOUS | Status: DC

## 2017-08-28 MED ORDER — ACETAMINOPHEN 500 MG PO TABS: 1000.0000 mg | ORAL_TABLET | Freq: Four times a day (QID) | ORAL | Status: DC

## 2017-08-28 MED ORDER — MUPIROCIN 2 % EX OINT
1.0000 "application " | TOPICAL_OINTMENT | Freq: Two times a day (BID) | CUTANEOUS | Status: DC
  Administered 2017-08-28 – 2017-09-07 (×22): 1 via TOPICAL
  Filled 2017-08-28: qty 22

## 2017-08-28 MED ORDER — AMIODARONE HCL IN DEXTROSE 360-4.14 MG/200ML-% IV SOLN
30.0000 mg/h | INTRAVENOUS | Status: DC
  Administered 2017-08-28 – 2017-08-31 (×5): 30 mg/h via INTRAVENOUS
  Filled 2017-08-28 (×6): qty 200

## 2017-08-28 MED ORDER — FAMOTIDINE IN NACL 20-0.9 MG/50ML-% IV SOLN
20.0000 mg | Freq: Two times a day (BID) | INTRAVENOUS | Status: AC
  Administered 2017-08-28 – 2017-08-29 (×2): 20 mg via INTRAVENOUS
  Filled 2017-08-28: qty 50

## 2017-08-28 MED ORDER — PHENYLEPHRINE HCL 10 MG/ML IJ SOLN
INTRAMUSCULAR | Status: AC
  Filled 2017-08-28: qty 1

## 2017-08-28 MED ORDER — ORAL CARE MOUTH RINSE
15.0000 mL | OROMUCOSAL | Status: DC
  Administered 2017-08-28 – 2017-09-08 (×105): 15 mL via OROMUCOSAL

## 2017-08-28 MED ORDER — VANCOMYCIN HCL IN DEXTROSE 1-5 GM/200ML-% IV SOLN
1000.0000 mg | Freq: Two times a day (BID) | INTRAVENOUS | Status: DC
  Administered 2017-08-29 – 2017-09-01 (×7): 1000 mg via INTRAVENOUS
  Filled 2017-08-28 (×7): qty 200

## 2017-08-28 MED ORDER — SODIUM CHLORIDE 0.9 % IV SOLN
250.0000 mL | INTRAVENOUS | Status: DC
  Administered 2017-09-08: 08:00:00 via INTRAVENOUS

## 2017-08-28 MED ORDER — MIDAZOLAM HCL 5 MG/5ML IJ SOLN
INTRAMUSCULAR | Status: DC | PRN
  Administered 2017-08-28: 2 mg via INTRAVENOUS
  Administered 2017-08-28: 3 mg via INTRAVENOUS

## 2017-08-28 MED ORDER — VASOPRESSIN 20 UNIT/ML IV SOLN
0.0400 [IU]/min | INTRAVENOUS | Status: DC | PRN
  Filled 2017-08-28: qty 2

## 2017-08-28 MED ORDER — PHENYLEPHRINE HCL 10 MG/ML IJ SOLN
INTRAMUSCULAR | Status: DC | PRN
  Administered 2017-08-28: 25 ug/min via INTRAVENOUS

## 2017-08-28 MED ORDER — METOPROLOL TARTRATE 5 MG/5ML IV SOLN: 2.5000 mg | INTRAVENOUS | Status: DC | PRN

## 2017-08-28 MED ORDER — MIDAZOLAM HCL 2 MG/2ML IJ SOLN
2.0000 mg | INTRAMUSCULAR | Status: DC | PRN
  Administered 2017-08-28 – 2017-08-31 (×29): 2 mg via INTRAVENOUS
  Filled 2017-08-28 (×32): qty 2

## 2017-08-28 MED ORDER — 0.9 % SODIUM CHLORIDE (POUR BTL) OPTIME
TOPICAL | Status: DC | PRN
  Administered 2017-08-28: 1000 mL

## 2017-08-28 MED FILL — Magnesium Sulfate Inj 50%: INTRAMUSCULAR | Qty: 10 | Status: AC

## 2017-08-28 MED FILL — Heparin Sodium (Porcine) Inj 1000 Unit/ML: INTRAMUSCULAR | Qty: 30 | Status: AC

## 2017-08-28 MED FILL — Potassium Chloride Inj 2 mEq/ML: INTRAVENOUS | Qty: 80 | Status: CN

## 2017-08-28 SURGICAL SUPPLY — 109 items
ADAPTER CARDIO PERF ANTE/RETRO (ADAPTER) ×5 IMPLANT
ADPR PRFSN 84XANTGRD RTRGD (ADAPTER) ×3
BAG DECANTER FOR FLEXI CONT (MISCELLANEOUS) ×5 IMPLANT
BANDAGE ACE 4X5 VEL STRL LF (GAUZE/BANDAGES/DRESSINGS) ×5 IMPLANT
BANDAGE ACE 6X5 VEL STRL LF (GAUZE/BANDAGES/DRESSINGS) ×5 IMPLANT
BANDAGE ELASTIC 4 VELCRO ST LF (GAUZE/BANDAGES/DRESSINGS) ×2 IMPLANT
BANDAGE ELASTIC 6 VELCRO ST LF (GAUZE/BANDAGES/DRESSINGS) ×2 IMPLANT
BASKET HEART  (ORDER IN 25'S) (MISCELLANEOUS) ×1
BASKET HEART (ORDER IN 25'S) (MISCELLANEOUS) ×1
BASKET HEART (ORDER IN 25S) (MISCELLANEOUS) ×3 IMPLANT
BLADE BEAVER EYE 37 5560 (BLADE) ×2 IMPLANT
BLADE CLIPPER SURG (BLADE) IMPLANT
BLADE STERNUM SYSTEM 6 (BLADE) ×5 IMPLANT
BLADE SURG 11 STRL SS (BLADE) ×2 IMPLANT
BLADE SURG 12 STRL SS (BLADE) ×5 IMPLANT
BNDG GAUZE ELAST 4 BULKY (GAUZE/BANDAGES/DRESSINGS) ×5 IMPLANT
CANISTER SUCT 3000ML PPV (MISCELLANEOUS) ×5 IMPLANT
CANNULA EZ GLIDE AORTIC 21FR (CANNULA) ×2 IMPLANT
CANNULA GUNDRY RCSP 15FR (MISCELLANEOUS) ×5 IMPLANT
CATH CPB KIT VANTRIGT (MISCELLANEOUS) ×5 IMPLANT
CATH ROBINSON RED A/P 18FR (CATHETERS) ×9 IMPLANT
CATH THORACIC 36FR RT ANG (CATHETERS) ×5 IMPLANT
CATH THORACIC 40FR (CATHETERS) ×2 IMPLANT
CLIP FOGARTY SPRING 6M (CLIP) ×2 IMPLANT
CLIP TI LARGE 6 (CLIP) ×6 IMPLANT
CLIP VESOCCLUDE SM WIDE 24/CT (CLIP) ×4 IMPLANT
COVER MAYO STAND STRL (DRAPES) ×2 IMPLANT
CRADLE DONUT ADULT HEAD (MISCELLANEOUS) ×5 IMPLANT
DRAIN CHANNEL 32F RND 10.7 FF (WOUND CARE) ×5 IMPLANT
DRAPE CARDIOVASCULAR INCISE (DRAPES) ×5
DRAPE SLUSH/WARMER DISC (DRAPES) ×5 IMPLANT
DRAPE SRG 135X102X78XABS (DRAPES) ×3 IMPLANT
DRSG AQUACEL AG ADV 3.5X14 (GAUZE/BANDAGES/DRESSINGS) ×5 IMPLANT
ELECT BLADE 4.0 EZ CLEAN MEGAD (MISCELLANEOUS) ×5
ELECT BLADE 6.5 EXT (BLADE) ×5 IMPLANT
ELECT CAUTERY BLADE 6.4 (BLADE) ×5 IMPLANT
ELECT REM PT RETURN 9FT ADLT (ELECTROSURGICAL) ×10
ELECTRODE BLDE 4.0 EZ CLN MEGD (MISCELLANEOUS) ×3 IMPLANT
ELECTRODE REM PT RTRN 9FT ADLT (ELECTROSURGICAL) ×6 IMPLANT
FELT TEFLON 1X6 (MISCELLANEOUS) ×10 IMPLANT
FEMORAL VENOUS CANN RAP (CANNULA) ×2 IMPLANT
GAUZE SPONGE 4X4 12PLY STRL (GAUZE/BANDAGES/DRESSINGS) ×10 IMPLANT
GAUZE SPONGE 4X4 12PLY STRL LF (GAUZE/BANDAGES/DRESSINGS) ×4 IMPLANT
GLOVE BIO SURGEON STRL SZ7.5 (GLOVE) ×15 IMPLANT
GOWN STRL REUS W/ TWL LRG LVL3 (GOWN DISPOSABLE) ×12 IMPLANT
GOWN STRL REUS W/TWL LRG LVL3 (GOWN DISPOSABLE) ×20
HEMOSTAT POWDER SURGIFOAM 1G (HEMOSTASIS) ×15 IMPLANT
HEMOSTAT SURGICEL 2X14 (HEMOSTASIS) ×5 IMPLANT
INSERT FOGARTY XLG (MISCELLANEOUS) IMPLANT
KIT BASIN OR (CUSTOM PROCEDURE TRAY) ×5 IMPLANT
KIT DILATOR VASC 18G NDL (KITS) ×2 IMPLANT
KIT ROOM TURNOVER OR (KITS) ×5 IMPLANT
KIT SUCTION CATH 14FR (SUCTIONS) ×9 IMPLANT
KIT VASOVIEW HEMOPRO VH 3000 (KITS) ×5 IMPLANT
LEAD PACING MYOCARDI (MISCELLANEOUS) ×5 IMPLANT
MARKER GRAFT CORONARY BYPASS (MISCELLANEOUS) ×15 IMPLANT
NDL PERC 18GX7CM (NEEDLE) IMPLANT
NEEDLE PERC 18GX7CM (NEEDLE) ×5 IMPLANT
NS IRRIG 1000ML POUR BTL (IV SOLUTION) ×25 IMPLANT
PACK OPEN HEART (CUSTOM PROCEDURE TRAY) ×5 IMPLANT
PACK VENTRIC ASSIST CUSTOM (KITS) ×2 IMPLANT
PAD ARMBOARD 7.5X6 YLW CONV (MISCELLANEOUS) ×10 IMPLANT
PAD ELECT DEFIB RADIOL ZOLL (MISCELLANEOUS) ×5 IMPLANT
PENCIL BUTTON HOLSTER BLD 10FT (ELECTRODE) ×5 IMPLANT
PUMP BLOOD CENTRIMAG (PUMP) ×2 IMPLANT
PUNCH AORTIC ROTATE 4.0MM (MISCELLANEOUS) IMPLANT
PUNCH AORTIC ROTATE 4.5MM 8IN (MISCELLANEOUS) IMPLANT
PUNCH AORTIC ROTATE 5MM 8IN (MISCELLANEOUS) IMPLANT
SET CARDIOPLEGIA MPS 5001102 (MISCELLANEOUS) ×2 IMPLANT
SHEATH AVANTI 11CM 5FR (MISCELLANEOUS) ×2 IMPLANT
SPONGE LAP 18X18 X RAY DECT (DISPOSABLE) ×2 IMPLANT
SURGIFLO W/THROMBIN 8M KIT (HEMOSTASIS) ×5 IMPLANT
SUT BONE WAX W31G (SUTURE) ×5 IMPLANT
SUT ETHIBOND 2 0 SH (SUTURE) ×10
SUT ETHIBOND 2 0 SH 36X2 (SUTURE) IMPLANT
SUT MNCRL AB 4-0 PS2 18 (SUTURE) ×2 IMPLANT
SUT PROLENE 3 0 SH DA (SUTURE) IMPLANT
SUT PROLENE 3 0 SH1 36 (SUTURE) IMPLANT
SUT PROLENE 4 0 RB 1 (SUTURE) ×5
SUT PROLENE 4 0 SH DA (SUTURE) ×5 IMPLANT
SUT PROLENE 4-0 RB1 .5 CRCL 36 (SUTURE) ×3 IMPLANT
SUT PROLENE 5 0 C 1 36 (SUTURE) IMPLANT
SUT PROLENE 6 0 C 1 30 (SUTURE) ×8 IMPLANT
SUT PROLENE 6 0 CC (SUTURE) ×19 IMPLANT
SUT PROLENE 8 0 BV175 6 (SUTURE) IMPLANT
SUT PROLENE BLUE 7 0 (SUTURE) ×5 IMPLANT
SUT SILK  1 MH (SUTURE) ×10
SUT SILK 1 MH (SUTURE) IMPLANT
SUT SILK 2 0 SH CR/8 (SUTURE) ×2 IMPLANT
SUT SILK 3 0 SH CR/8 (SUTURE) IMPLANT
SUT STEEL 6MS V (SUTURE) ×10 IMPLANT
SUT STEEL SZ 6 DBL 3X14 BALL (SUTURE) ×5 IMPLANT
SUT VIC AB 1 CTX 18 (SUTURE) ×4 IMPLANT
SUT VIC AB 1 CTX 36 (SUTURE) ×10
SUT VIC AB 1 CTX36XBRD ANBCTR (SUTURE) ×6 IMPLANT
SUT VIC AB 2-0 CT1 27 (SUTURE) ×5
SUT VIC AB 2-0 CT1 TAPERPNT 27 (SUTURE) IMPLANT
SUT VIC AB 2-0 CTX 27 (SUTURE) IMPLANT
SUT VIC AB 3-0 X1 27 (SUTURE) IMPLANT
SUTURE E-PAK OPEN HEART (SUTURE) ×5 IMPLANT
SYSTEM SAHARA CHEST DRAIN ATS (WOUND CARE) ×5 IMPLANT
TOWEL GREEN STERILE (TOWEL DISPOSABLE) ×20 IMPLANT
TOWEL GREEN STERILE FF (TOWEL DISPOSABLE) ×10 IMPLANT
TOWEL OR 17X24 6PK STRL BLUE (TOWEL DISPOSABLE) ×10 IMPLANT
TOWEL OR 17X26 10 PK STRL BLUE (TOWEL DISPOSABLE) ×10 IMPLANT
TRAY FOLEY SILVER 16FR TEMP (SET/KITS/TRAYS/PACK) ×5 IMPLANT
TUBING INSUFFLATION (TUBING) ×5 IMPLANT
UNDERPAD 30X30 (UNDERPADS AND DIAPERS) ×5 IMPLANT
WATER STERILE IRR 1000ML POUR (IV SOLUTION) ×10 IMPLANT

## 2017-08-28 NOTE — Anesthesia Procedure Notes (Signed)
Arterial Line Insertion Start/End9/20/2018 7:45 AM, 08/28/2017 7:52 AM Performed by: Reine Just, CRNA  Patient location: OR. Preanesthetic checklist: patient identified, IV checked, site marked, risks and benefits discussed, surgical consent, monitors and equipment checked and pre-op evaluation Lidocaine 1% used for infiltration and patient sedated Left, radial was placed Catheter size: 20 G Hand hygiene performed  and maximum sterile barriers used  Allen's test indicative of satisfactory collateral circulation Attempts: 1 Procedure performed without using ultrasound guided technique. Following insertion, dressing applied and Biopatch. Post procedure assessment: normal  Patient tolerated the procedure well with no immediate complications.

## 2017-08-28 NOTE — Progress Notes (Signed)
  Echocardiogram Echocardiogram Transesophageal has been performed.  Janalyn Harder 08/28/2017, 9:44 AM

## 2017-08-28 NOTE — Anesthesia Procedure Notes (Signed)
Procedure Name: Intubation Date/Time: 08/28/2017 7:57 AM Performed by: Reine Just Pre-anesthesia Checklist: Patient identified, Emergency Drugs available, Suction available and Patient being monitored Patient Re-evaluated:Patient Re-evaluated prior to induction Oxygen Delivery Method: Circle system utilized Preoxygenation: Pre-oxygenation with 100% oxygen Induction Type: IV induction Ventilation: Mask ventilation without difficulty and Oral airway inserted - appropriate to patient size Laryngoscope Size: Miller and 3 Grade View: Grade I Tube type: Subglottic suction tube Tube size: 8.5 mm Number of attempts: 1 Airway Equipment and Method: Patient positioned with wedge pillow and Stylet Placement Confirmation: ETT inserted through vocal cords under direct vision,  positive ETCO2 and breath sounds checked- equal and bilateral Secured at: 22 cm Tube secured with: Tape Dental Injury: Teeth and Oropharynx as per pre-operative assessment

## 2017-08-28 NOTE — Anesthesia Procedure Notes (Signed)
Central Venous Catheter Insertion Performed by: Suzette Battiest, anesthesiologist Start/End9/20/2018 8:05 AM, 08/28/2017 8:15 AM Patient location: Pre-op. Preanesthetic checklist: patient identified, IV checked, site marked, risks and benefits discussed, surgical consent, monitors and equipment checked, pre-op evaluation, timeout performed and anesthesia consent Position: Trendelenburg Lidocaine 1% used for infiltration and patient sedated Hand hygiene performed , maximum sterile barriers used  and Seldinger technique used Catheter size: 9 Fr Total catheter length 10. Central line and PA cath was placed.MAC introducer Swan type:thermodilution PA Cath depth:50 Procedure performed using ultrasound guided technique. Ultrasound Notes:anatomy identified, needle tip was noted to be adjacent to the nerve/plexus identified, no ultrasound evidence of intravascular and/or intraneural injection and image(s) printed for medical record Attempts: 1 Following insertion, line sutured, dressing applied and Biopatch. Post procedure assessment: blood return through all ports, free fluid flow and no air  Patient tolerated the procedure well with no immediate complications.

## 2017-08-28 NOTE — Progress Notes (Signed)
      [address]       [place] [address]             [phone]      S/p CABG, placement of centrimag RVAD  Intubated sedated  BP 101/64   Pulse (!) 102   Temp (!) 95.4 F (35.2 C)   Resp 12   Ht  (1.803 m)   Wt 198 lb 6.6 oz (90 kg)   SpO2 100%   BMI 27.67 kg/m   Intake/Output Summary (Last 24 hours) at 08/28/17 1907 Last data filed at 08/28/17 1900  Gross per 24 hour  Intake          4865.69 ml  Output             4480 ml  Net           385.69 ml   PA= 32/18, RVAD flows 4.5 L/min, CI reading 1.3, but co-ox 69 On milrinone  Critically ill, continue present management Unclear with CI when RVAD reading 4.5 L/ min, will check co-ox perodically  Zidane Renner C. Dorris Fetch, MD [facility] [phone]

## 2017-08-28 NOTE — Anesthesia Postprocedure Evaluation (Signed)
Anesthesia Post Note  Patient: Clinton Harrington  Procedure(s) Performed: Procedure(s) (LRB): CORONARY ARTERY BYPASS GRAFTING (CABG) times four using left internal mammary artery and right greater saphenous leg vein using endoscope. (N/A) TRANSESOPHAGEAL ECHOCARDIOGRAM (TEE) (N/A) PLACEMENT OF CENTRIMAG VENTRICULAR ASSIST DEVICE (Right)     Patient location during evaluation: SICU Anesthesia Type: General Level of consciousness: sedated Pain management: pain level controlled Vital Signs Assessment: post-procedure vital signs reviewed and stable Respiratory status: patient remains intubated per anesthesia plan Cardiovascular status: stable Postop Assessment: no apparent nausea or vomiting Anesthetic complications: no    Last Vitals:  Vitals:   08/28/17 1600 08/28/17 1615  BP: 112/70   Pulse: (!) 37 (!) 102  Resp: 12 12  Temp: (!) 35.2 C (!) 35.2 C  SpO2: 100% 100%    Last Pain:  Vitals:   08/28/17 0400  TempSrc:   PainSc: Ardean Larsen

## 2017-08-28 NOTE — Anesthesia Procedure Notes (Signed)
Central Venous Catheter Insertion Performed by: Marcene Duos, anesthesiologist Start/End9/20/2018 8:05 AM, 08/28/2017 8:15 AM Patient location: Pre-op. Preanesthetic checklist: patient identified, IV checked, site marked, risks and benefits discussed, surgical consent, monitors and equipment checked, pre-op evaluation, timeout performed and anesthesia consent Hand hygiene performed  and maximum sterile barriers used  PA cath was placed.Swan type:thermodilution PA Cath depth:50 Procedure performed using ultrasound guided technique. Ultrasound Notes:anatomy identified, needle tip was noted to be adjacent to the nerve/plexus identified, no ultrasound evidence of intravascular and/or intraneural injection and image(s) printed for medical record Attempts: 1 Patient tolerated the procedure well with no immediate complications.

## 2017-08-28 NOTE — OR Nursing (Signed)
2 Clear keepers left in chest to secure aortic cannula.

## 2017-08-28 NOTE — Progress Notes (Signed)
Pre Procedure note for inpatients:   Clinton Harrington has been scheduled for Procedure(s): CORONARY ARTERY BYPASS GRAFTING (CABG) (N/A) TRANSESOPHAGEAL ECHOCARDIOGRAM (TEE) (N/A) today. The various methods of treatment have been discussed with the patient. After consideration of the risks, benefits and treatment options the patient has consented to the planned procedure.   The patient has been seen and labs reviewed. There are no changes in the patient's condition to prevent proceeding with the planned procedure today.  Recent labs:  Lab Results  Component Value Date   WBC 9.7 08/28/2017   HGB 11.9 (L) 08/28/2017   HCT 36.0 (L) 08/28/2017   PLT 148 (L) 08/28/2017   GLUCOSE 208 (H) 08/28/2017   CHOL 126 08/24/2017   TRIG 240 (H) 08/24/2017   HDL 31 (L) 08/24/2017   LDLDIRECT 128.6 10/30/2012   LDLCALC 47 08/24/2017   ALT 25 08/28/2017   AST 27 08/28/2017   NA 133 (L) 08/28/2017   K 3.9 08/28/2017   CL 91 (L) 08/28/2017   CREATININE 1.04 08/28/2017   BUN 20 08/28/2017   CO2 32 08/28/2017   TSH 0.722 08/25/2017   INR 1.15 08/25/2017   HGBA1C 5.9 (H) 08/25/2017   MICROALBUR 30.9 (H) 10/30/2012    [name], MD 08/28/2017 7:40 AM

## 2017-08-28 NOTE — Brief Op Note (Signed)
08/23/2017 - 08/28/2017  12:02 PM  PATIENT:  Clinton Harrington  66 y.o. male  PRE-OPERATIVE DIAGNOSIS:  Coronary artery disease  POST-OPERATIVE DIAGNOSIS:  Coronary artery disease  PROCEDURE:  Procedure(s): CORONARY ARTERY BYPASS GRAFTING (CABG) times four using left internal mammary artery and right greater saphenous leg vein using endoscope. (N/A) TRANSESOPHAGEAL ECHOCARDIOGRAM (TEE) (N/A) PLACEMENT OF CENTRIMAG VENTRICULAR ASSIST DEVICE (Right)  SURGEON:  Surgeon(s) and Role:    Kerin Perna, MD - Primary  PHYSICIAN ASSISTANT:  Jari Favre, PA-C   ANESTHESIA:   general  EBL:  Total I/O In: -  Out: 265 [Urine:265]  BLOOD ADMINISTERED:none  DRAINS: ROUTINE   LOCAL MEDICATIONS USED:  NONE  SPECIMEN:  No Specimen  DISPOSITION OF SPECIMEN:  N/A  COUNTS:  YES  TOURNIQUET:  * No tourniquets in log *  DICTATION: .Dragon Dictation  PLAN OF CARE: Admit to inpatient   PATIENT DISPOSITION:  ICU - intubated and hemodynamically stable.   Delay start of Pharmacological VTE agent (>24hrs) due to surgical blood loss or risk of bleeding: yes

## 2017-08-28 NOTE — Transfer of Care (Signed)
Immediate Anesthesia Transfer of Care Note  Patient: Clinton Harrington  Procedure(s) Performed: Procedure(s): CORONARY ARTERY BYPASS GRAFTING (CABG) times four using left internal mammary artery and right greater saphenous leg vein using endoscope. (N/A) TRANSESOPHAGEAL ECHOCARDIOGRAM (TEE) (N/A) PLACEMENT OF CENTRIMAG VENTRICULAR ASSIST DEVICE (Right)  Patient Location: ICU  Anesthesia Type:General  Level of Consciousness: Patient remains intubated per anesthesia plan  Airway & Oxygen Therapy: Patient remains intubated per anesthesia plan and Patient placed on Ventilator (see vital sign flow sheet for setting)  Post-op Assessment: Report given to RN and Post -op Vital signs reviewed and stable  Post vital signs: Reviewed and stable  Last Vitals:  Vitals:   08/28/17 0600 08/28/17 0700  BP: 108/65   Pulse: (!) 51 (!) 54  Resp: 17 (!) 27  Temp:    SpO2: 95% 97%    Last Pain:  Vitals:   08/28/17 0400  TempSrc:   PainSc: Asleep      Patients Stated Pain Goal: 0 (08/27/17 1200)  Complications: No apparent anesthesia complications

## 2017-08-28 NOTE — Progress Notes (Addendum)
Pharmacy Antibiotic Note  Clinton Harrington is a 66 y.o. male admitted on 08/23/2017 with infection prophylaxis post-centrimag placement.  Pharmacy has been consulted for vancomycin dosing.  Plan: Vancomycin 1000 mg IV Q12 hours Monitor for s/sx of infection, LOT, and check troughs as appropriate.     Height:  (180.3 cm) Weight: 198 lb 6.6 oz (90 kg) IBW/kg (Calculated) : 75.3  Temp (24hrs), Avg:97.4 F (36.3 C), Min:95.7 F (35.4 C), Max:98.8 F (37.1 C)   Recent Labs Lab 08/23/17 2127 08/24/17 0027 08/24/17 0327 08/24/17 0644 08/25/17 0422 08/26/17 0456  08/27/17 0414  08/28/17 0350  08/28/17 1023 08/28/17 1041 08/28/17 1139 08/28/17 1242 08/28/17 1331  WBC  --   --  13.6* 13.3* 13.4* 11.3*  --  9.9  --  9.7  --   --   --   --   --   --   CREATININE  --   --  2.69*  --  1.63* 1.11  < > 1.02  < > 1.04  < > 1.00 0.90 1.00 0.90 1.00  LATICACIDVEN 2.6* 2.4* 2.1*  --   --   --   --   --   --   --   --   --   --   --   --   --   < > = values in this interval not displayed.  Estimated Creatinine Clearance: 80.5 mL/min (by C-G formula based on SCr of 1 mg/dL).    Allergies  Allergen Reactions  . Aspirin Other (See Comments)    Stomach upset  . Latex Rash  . Oxycodone-Acetaminophen Itching    Adline Potter, PharmD Pharmacy Resident Pager: [phone] 08/28/2017 3:36 PM

## 2017-08-29 ENCOUNTER — Inpatient Hospital Stay (HOSPITAL_COMMUNITY): Payer: PPO

## 2017-08-29 ENCOUNTER — Encounter (HOSPITAL_COMMUNITY): Payer: Self-pay | Admitting: Cardiothoracic Surgery

## 2017-08-29 DIAGNOSIS — Z951 Presence of aortocoronary bypass graft: Secondary | ICD-10-CM

## 2017-08-29 LAB — CBC
HEMATOCRIT: 31.3 % — AB (ref 39.0–52.0)
HEMATOCRIT: 30.7 % — AB (ref 39.0–52.0)
HEMOGLOBIN: 10.3 g/dL — AB (ref 13.0–17.0)
HEMOGLOBIN: 10.1 g/dL — AB (ref 13.0–17.0)
MCH: 28.5 pg (ref 26.0–34.0)
MCH: 29.2 pg (ref 26.0–34.0)
MCHC: 32.9 g/dL (ref 30.0–36.0)
MCHC: 32.9 g/dL (ref 30.0–36.0)
MCV: 86.7 fL (ref 78.0–100.0)
MCV: 88.7 fL (ref 78.0–100.0)
Platelets: 150 10*3/uL (ref 150–400)
Platelets: 156 10*3/uL (ref 150–400)
RBC: 3.61 MIL/uL — ABNORMAL LOW (ref 4.22–5.81)
RBC: 3.46 MIL/uL — AB (ref 4.22–5.81)
RDW: 13.8 % (ref 11.5–15.5)
RDW: 14.1 % (ref 11.5–15.5)
WBC: 10.3 10*3/uL (ref 4.0–10.5)
WBC: 13.4 10*3/uL — AB (ref 4.0–10.5)

## 2017-08-29 LAB — COOXEMETRY PANEL
Carboxyhemoglobin: 1.2 % (ref 0.5–1.5)
Carboxyhemoglobin: 0.7 % (ref 0.5–1.5)
Carboxyhemoglobin: 1.2 % (ref 0.5–1.5)
METHEMOGLOBIN: 1.5 % (ref 0.0–1.5)
Methemoglobin: 0.9 % (ref 0.0–1.5)
Methemoglobin: 1.2 % (ref 0.0–1.5)
O2 SAT: 68.6 %
O2 Saturation: 64.7 %
O2 Saturation: 64.9 %
TOTAL HEMOGLOBIN: 10.2 g/dL — AB (ref 12.0–16.0)
TOTAL HEMOGLOBIN: 9.6 g/dL — AB (ref 12.0–16.0)
Total hemoglobin: 10.3 g/dL — ABNORMAL LOW (ref 12.0–16.0)

## 2017-08-29 LAB — BASIC METABOLIC PANEL
ANION GAP: 6 (ref 5–15)
BUN: 18 mg/dL (ref 6–20)
CHLORIDE: 98 mmol/L — AB (ref 101–111)
CO2: 29 mmol/L (ref 22–32)
CREATININE: 0.92 mg/dL (ref 0.61–1.24)
Calcium: 7.6 mg/dL — ABNORMAL LOW (ref 8.9–10.3)
GFR calc non Af Amer: >60 mL/min (ref >60)
Glucose, Bld: 99 mg/dL (ref 65–99)
Potassium: 4.3 mmol/L (ref 3.5–5.1)
Sodium: 133 mmol/L — ABNORMAL LOW (ref 135–145)

## 2017-08-29 LAB — BPAM PLATELET PHERESIS
Blood Product Expiration Date: 201809212359
ISSUE DATE / TIME: 201809201320
Unit Type and Rh: 5100

## 2017-08-29 LAB — POCT ACTIVATED CLOTTING TIME
ACTIVATED CLOTTING TIME: 131 s
ACTIVATED CLOTTING TIME: 131 s
ACTIVATED CLOTTING TIME: 131 s
ACTIVATED CLOTTING TIME: 164 s
Activated Clotting Time: 131 seconds
Activated Clotting Time: 131 seconds
Activated Clotting Time: 131 seconds
Activated Clotting Time: 147 seconds
Activated Clotting Time: 153 seconds
Activated Clotting Time: 125 seconds
Activated Clotting Time: 147 seconds
Activated Clotting Time: 147 seconds
Activated Clotting Time: 158 seconds

## 2017-08-29 LAB — CULTURE, RESPIRATORY W GRAM STAIN: Culture: NORMAL

## 2017-08-29 LAB — HEPARIN LEVEL (UNFRACTIONATED)

## 2017-08-29 LAB — GLUCOSE, CAPILLARY
GLUCOSE-CAPILLARY: 99 mg/dL (ref 65–99)
GLUCOSE-CAPILLARY: 101 mg/dL — AB (ref 65–99)
GLUCOSE-CAPILLARY: 102 mg/dL — AB (ref 65–99)
GLUCOSE-CAPILLARY: 103 mg/dL — AB (ref 65–99)
GLUCOSE-CAPILLARY: 125 mg/dL — AB (ref 65–99)
GLUCOSE-CAPILLARY: 130 mg/dL — AB (ref 65–99)
GLUCOSE-CAPILLARY: 199 mg/dL — AB (ref 65–99)
GLUCOSE-CAPILLARY: 307 mg/dL — AB (ref 65–99)
GLUCOSE-CAPILLARY: 333 mg/dL — AB (ref 65–99)
GLUCOSE-CAPILLARY: 344 mg/dL — AB (ref 65–99)
Glucose-Capillary: 103 mg/dL — ABNORMAL HIGH (ref 65–99)
Glucose-Capillary: 104 mg/dL — ABNORMAL HIGH (ref 65–99)
Glucose-Capillary: 119 mg/dL — ABNORMAL HIGH (ref 65–99)
Glucose-Capillary: 127 mg/dL — ABNORMAL HIGH (ref 65–99)
Glucose-Capillary: 162 mg/dL — ABNORMAL HIGH (ref 65–99)
Glucose-Capillary: 291 mg/dL — ABNORMAL HIGH (ref 65–99)
Glucose-Capillary: 90 mg/dL (ref 65–99)
Glucose-Capillary: 95 mg/dL (ref 65–99)

## 2017-08-29 LAB — PREPARE FRESH FROZEN PLASMA
Unit division: 0
Unit division: 0

## 2017-08-29 LAB — PREPARE PLATELET PHERESIS: Unit division: 0

## 2017-08-29 LAB — MAGNESIUM
MAGNESIUM: 1.9 mg/dL (ref 1.7–2.4)
Magnesium: 2.5 mg/dL — ABNORMAL HIGH (ref 1.7–2.4)

## 2017-08-29 LAB — BPAM FFP
Blood Product Expiration Date: 201809222359
Blood Product Expiration Date: 201809222359
ISSUE DATE / TIME: 201809201321
ISSUE DATE / TIME: 201809201321
Unit Type and Rh: 6200
Unit Type and Rh: 8400

## 2017-08-29 LAB — POCT I-STAT 3, ART BLOOD GAS (G3+)
Acid-Base Excess: 3 mmol/L — ABNORMAL HIGH (ref 0.0–2.0)
Acid-Base Excess: 4 mmol/L — ABNORMAL HIGH (ref 0.0–2.0)
BICARBONATE: 30 mmol/L — AB (ref 20.0–28.0)
Bicarbonate: 28.2 mmol/L — ABNORMAL HIGH (ref 20.0–28.0)
O2 SAT: 93 %
O2 Saturation: 94 %
PCO2 ART: 48.1 mmHg — AB (ref 32.0–48.0)
PH ART: 7.377 (ref 7.350–7.450)
PO2 ART: 76 mmHg — AB (ref 83.0–108.0)
Patient temperature: 37.3
Patient temperature: 36.7
TCO2: 30 mmol/L (ref 22–32)
TCO2: 31 mmol/L (ref 22–32)
pCO2 arterial: 48.1 mmHg — ABNORMAL HIGH (ref 32.0–48.0)
pH, Arterial: 7.401 (ref 7.350–7.450)
pO2, Arterial: 68 mmHg — ABNORMAL LOW (ref 83.0–108.0)

## 2017-08-29 LAB — POCT I-STAT, CHEM 8
BUN: 20 mg/dL (ref 6–20)
CREATININE: 1 mg/dL (ref 0.61–1.24)
Calcium, Ion: 1.03 mmol/L — ABNORMAL LOW (ref 1.15–1.40)
Chloride: 94 mmol/L — ABNORMAL LOW (ref 101–111)
GLUCOSE: 205 mg/dL — AB (ref 65–99)
HCT: 30 % — ABNORMAL LOW (ref 39.0–52.0)
HEMOGLOBIN: 10.2 g/dL — AB (ref 13.0–17.0)
Potassium: 4.7 mmol/L (ref 3.5–5.1)
Sodium: 132 mmol/L — ABNORMAL LOW (ref 135–145)
TCO2: 26 mmol/L (ref 22–32)

## 2017-08-29 LAB — CREATININE, SERUM
CREATININE: 1.08 mg/dL (ref 0.61–1.24)
GFR calc non Af Amer: >60 mL/min (ref >60)

## 2017-08-29 LAB — CALCIUM, IONIZED: CALCIUM, IONIZED, SERUM: 4.4 mg/dL — AB (ref 4.5–5.6)

## 2017-08-29 LAB — PROTIME-INR
INR: 1.27
PROTHROMBIN TIME: 15.8 s — AB (ref 11.4–15.2)

## 2017-08-29 LAB — HEMOGLOBIN AND HEMATOCRIT, BLOOD
HCT: 30.8 % — ABNORMAL LOW (ref 39.0–52.0)
Hemoglobin: 10.4 g/dL — ABNORMAL LOW (ref 13.0–17.0)

## 2017-08-29 LAB — APTT: aPTT: 34 seconds (ref 24–36)

## 2017-08-29 MED ORDER — HEPARIN (PORCINE) IN NACL 100-0.45 UNIT/ML-% IJ SOLN
500.0000 [IU]/h | INTRAMUSCULAR | Status: DC
  Administered 2017-08-29: 1000 [IU]/h via INTRAVENOUS
  Administered 2017-08-29: 500 [IU]/h via INTRAVENOUS
  Administered 2017-08-29: 1000 [IU]/h via INTRAVENOUS
  Administered 2017-08-29 (×2): 500 [IU]/h via INTRAVENOUS
  Administered 2017-08-30 – 2017-08-31 (×3): 1500 [IU]/h via INTRAVENOUS
  Filled 2017-08-29 (×4): qty 250

## 2017-08-29 MED ORDER — PRO-STAT SUGAR FREE PO LIQD
30.0000 mL | Freq: Three times a day (TID) | ORAL | Status: DC
  Administered 2017-08-29 – 2017-09-07 (×29): 30 mL
  Filled 2017-08-29 (×29): qty 30

## 2017-08-29 MED ORDER — VITAL HIGH PROTEIN PO LIQD: 1000.0000 mL | ORAL | Status: DC

## 2017-08-29 MED ORDER — PRO-STAT SUGAR FREE PO LIQD: 30.0000 mL | Freq: Two times a day (BID) | ORAL | Status: DC

## 2017-08-29 MED ORDER — SODIUM CHLORIDE 0.9% FLUSH: 10.0000 mL | INTRAVENOUS | Status: DC | PRN

## 2017-08-29 MED ORDER — DOCUSATE SODIUM 50 MG/5ML PO LIQD
200.0000 mg | Freq: Every day | ORAL | Status: DC
  Administered 2017-08-29 – 2017-08-30 (×2): 200 mg
  Filled 2017-08-29 (×2): qty 20

## 2017-08-29 MED ORDER — INSULIN DETEMIR 100 UNIT/ML ~~LOC~~ SOLN
15.0000 [IU] | Freq: Every day | SUBCUTANEOUS | Status: DC
  Administered 2017-08-29: 15 [IU] via SUBCUTANEOUS
  Filled 2017-08-29 (×3): qty 0.15

## 2017-08-29 MED ORDER — SODIUM CHLORIDE 0.9% FLUSH
10.0000 mL | Freq: Two times a day (BID) | INTRAVENOUS | Status: DC
  Administered 2017-08-29 – 2017-09-07 (×13): 10 mL

## 2017-08-29 MED ORDER — FUROSEMIDE 10 MG/ML IJ SOLN
20.0000 mg | Freq: Two times a day (BID) | INTRAMUSCULAR | Status: DC
  Administered 2017-08-29 – 2017-08-30 (×3): 20 mg via INTRAVENOUS
  Filled 2017-08-29: qty 2

## 2017-08-29 MED ORDER — INSULIN ASPART 100 UNIT/ML ~~LOC~~ SOLN
0.0000 [IU] | SUBCUTANEOUS | Status: DC
  Administered 2017-08-29: 12 [IU] via SUBCUTANEOUS
  Administered 2017-08-29: 2 [IU] via SUBCUTANEOUS
  Administered 2017-08-29: 4 [IU] via SUBCUTANEOUS

## 2017-08-29 MED ORDER — VITAL 1.5 CAL PO LIQD
1000.0000 mL | ORAL | Status: DC
  Administered 2017-08-29 – 2017-09-01 (×3): 1000 mL
  Filled 2017-08-29 (×7): qty 1000

## 2017-08-29 MED FILL — Potassium Chloride Inj 2 mEq/ML: INTRAVENOUS | Qty: 40 | Status: AC

## 2017-08-29 NOTE — Op Note (Signed)
NAME:  Clinton Harrington, Clinton Harrington NO.:  MEDICAL RECORD NO.:  [id number]  LOCATION:                                 FACILITY:  PHYSICIAN:  Kerin Perna, M.D.       DATE OF BIRTH:  DATE OF PROCEDURE:  08/28/2017 DATE OF DISCHARGE:                              OPERATIVE REPORT   OPERATIONS: 1. Coronary artery bypass grafting x4 (left internal mammary artery to     LAD, saphenous vein graft to diagonal, saphenous vein graft to     circumflex marginal, saphenous vein graft to right coronary artery     - RV marginal). 2. Endoscopic harvest of right leg greater saphenous vein. 3. Placement of percutaneous right ventricular assist device using a     CentriMag pump.  PREOPERATIVE DIAGNOSES:  ST-elevation myocardial infarction, right ventricular infarct, cardiogenic shock, 3-vessel coronary artery disease.  POSTOPERATIVE DIAGNOSES:  ST-elevation myocardial infarction, right ventricular infarct with cardiogenic shock, severe 3-vessel coronary artery disease, mild-moderate tricuspid regurgitation, right ventricular dysfunction following coronary artery bypass grafting requiring placement of right ventricular assist device.  SURGEON:  Kerin Perna, MD.  ASSISTANT:  Jari Favre, PA-C.  ANESTHESIA:  General by Dr. Casilda Carls.  CLINICAL NOTE:  The patient is a 66 year old Caucasian male, diabetic, who presented with chest pain, hypotension, confusion, and positive cardiac enzymes.  He was resuscitated with fluids and epinephrine and taken directly to cardiac catheterization lab by Dr. Nanetta Batty.  He was found to have total occlusion of the RCA with previously placed RCA stents, high-grade stenosis of the LAD diagonal and a high-grade stenosis of the circumflex marginal.  EF was 45%, but blood pressure was low, CVP was high and a balloon pump was placed.  Subsequent echocardiogram showed poor RV function consistent with an RV infarct. The patient was  supported in the ICU with the balloon pump and Milrinone.  His CVP was elevated and he was diuresed.  He developed atrial arrhythmias and placed on amiodarone.  He was on preoperative Plavix and underwent a Plavix washout prior to recommended surgery by his cardiologist.  I examined the patient, discussed the results of his cardiac cath and echo and discussed the strategy for treatment of his heart disease with the patient at the bedside.  I was concerned over the RV infarct and plan to give the patient 4-5 days to recover prior to surgery and also to allow Plavix washout.  The patient remained stable, although with some altered mental status up until the time of surgery.  Prior to surgery, the patient was alert and lucid.  When I discussed the indications, benefits, and risks of surgery including the risks of ventricular assist device, bleeding requiring blood transfusion, stroke, infection, MI, postoperative organ failure, postoperative pulmonary problems including pleural effusion, and death.  He agreed to proceed with surgery under what I felt was an informed consent.  FINDINGS: 1. Severe RV dysfunction upon opening the pericardium and examining     the heart. 2. Adequate conduit. 3. Large right pleural effusion from preoperative heart failure, which     was drained. 4. Heavily diseased main right coronary artery, but with a  graftable     marginal branch of the RV, which was bypassed in order to improve     RV function. 5. Minimal RV improvement after coronary revascularization which     necessitated placement of a percutaneous right ventricular assist     device using a 7-mm cannula through the RV outflow tract and a 23-     25 two-stage venous cannula placed from the right femoral vein into     the right atrium, connected to the CentriMag pump.  DESCRIPTION OF PROCEDURE:  The patient was brought to the operating room, and placed supine on the operating table.  General  anesthesia was induced under invasive hemodynamic monitoring.  A transesophageal echo probe was placed by the Anesthesia team.  The chest, abdomen, and legs were prepped with Betadine and draped as a sterile field.  A proper time- out was performed.  A 5-French micro introducer was placed in the right femoral vein.  A sternal incision was made as the saphenous vein was harvested endoscopically from the right leg.  The left internal mammary artery was harvested as a pedicle graft from its origin at the subclavian vessels.  It was 1.5-mm vessel with good flow.  The sternal retractor was placed and the pericardium was suspended.  Pursestrings were placed in ascending aorta and right atrium and heparin was administered.  When ACT was documented as being therapeutic, the patient was cannulated and placed on bypass.  The coronaries were identified for grafting.  The cardioplegia cannulas were placed for both antegrade and retrograde cold blood cardioplegia.  The patient was cooled to 32 degrees.  The aortic crossclamp was applied, and a liter of cold blood cardioplegia was delivered in split doses between the antegrade aortic and retrograde coronary sinus catheters.  There was good cardioplegic arrest, and septal temperature dropped less than 12 degrees. Cardioplegia was delivered every 20 minutes.  The distal coronary anastomoses were performed.  The first distal anastomosis was to the RV marginal branch of the right coronary artery. The right coronary was totally occluded.  The upper marginal branch was 1.4 mm.  A reversed saphenous vein was sewn end-to-side with running 7-0 Prolene with good flow through the graft.  Cardioplegia was redosed.  The second distal anastomosis was the OM branch of the left circumflex. This was a 1.5-mm vessel.  It had a proximal 80% stenosis.  A reversed saphenous vein was sewn end-to-side with running 7-0 Prolene.  There was good flow through the graft.   Cardioplegia was redosed.  The third distal anastomosis was to the diagonal branch to LAD.  This was a 1.4-mm vessel with ostial 80-90% stenosis.  A reversed saphenous vein was sewn end-to-side with running 7-0 Prolene with good flow through the graft.  Cardioplegia was redosed.  The fourth distal anastomosis was to the proximal to mid LAD.  Distally, it was small and intramyocardial.  The LAD had a proximal 95% stenosis. The left IMA pedicle was brought through an opening in the left pericardium and it was brought down onto the LAD and sewn end-to-side with running 8-0 Prolene.  There was good flow through the anastomosis, and the bulldog was reapplied to the pedicle and the pedicle was secured to the epicardium with 6-0 Prolene.  Cardioplegia was redosed.  With the crossclamp was still in place, 3 proximal vein anastomoses were performed on the ascending aorta using a 4.5-mm punch running 6-0 Prolene.  Prior to tying down the final proximal anastomosis, air was vented  from the coronaries with a dose of retrograde warm blood cardioplegia.  The crossclamp was removed.  The vein grafts were de-aired and opened.  Each had good flow and hemostasis was documented at the proximal and distal sites.  The patient was rewarmed and reperfused.  The cardioplegia cannulas were removed.  A pledgeted pursestring was placed in the RV outflow tract for placement of the RVAD outflow cannula.  Using echocardiogram, a guidewire was passed up into the right atrium through the right femoral vein and over that dilators and then the 23-25 mm 2-staged venous cannula with the tip in the proximal SVC.  Through the pursestring previously placed in the RV outflow tract, a 7- mm arterial sheath was passed into the main pulmonary artery at its bifurcation and brought out through a separate incision in the chest and secured.  Both the femoral venous and the pulmonary artery cannulas were then connected to the  RVAD circuit, which was passed sterilely onto the operative field from the CentriMag console.  The lines had been previously primed.  The lungs were then re-expanded.  The pacing wires were applied.  The patient was prepared for separation from cardiopulmonary bypass with low- dose inotropes including milrinone, norepinephrine, and epinephrine. The patient was then transitioned off cardiopulmonary bypass, then placed on RVAD support with flows up to 4.5 L/minute with stable hemodynamics and blood pressure.  Echo showed persistently significant RV dysfunction prior to starting the right ventricular assist device.  We then gave protamine slowly.  The cannulas for cardiopulmonary bypass were removed.  The mediastinum was irrigated.  Both pleural spaces were drained and chest tubes were placed.  Anterior mediastinal chest tube was placed.  The superior pericardial fat was closed over the aorta. The tourniquets for the pursestring for the RVAD outflow cannula were then tightened using multiple large cysto clips.  An anterior mediastinal was placed and brought out through separate incision.  The sternum was not closed due to the RV dysfunction.  The chest wall soft tissue was closed in 3 layers using interrupted #1 Vicryl, running 2-0 Vicryl, and then running subcuticular.  The patient remained stable. Sterile dressings were applied.  A chest x-ray was taken in the operating showed the cannulas and tubes in good position without pneumothorax.  The patient was then transported back to the ICU.  Total cardiopulmonary bypass time was 153 minutes.     Kerin Perna, M.D.     PV/MEDQ  D:  08/28/2017  T:  08/28/2017  Job:  [id number]

## 2017-08-29 NOTE — Progress Notes (Signed)
Initial Nutrition Assessment  DOCUMENTATION CODES:   Not applicable  INTERVENTION:    Vital 1.5 formula at goal rate of 40 ml/hr with Prostat 30 ml TID  TF regimen to provide 2100 kcals, 126 gm protein, 733 ml of free water  NUTRITION DIAGNOSIS:   Inadequate oral intake related to inability to eat as evidenced by NPO status  GOAL:   Patient will meet greater than or equal to 90% of their needs  MONITOR:   Vent status, TF tolerance, Weight trends, Skin, Labs, I & O's  REASON FOR ASSESSMENT:   Ventilator, Other (Comment) (New Tube Feeding)  ASSESSMENT:   67 yo Male with PMH of DM, HTN; found to have severe three-vessel disease; presented for surgical intervention.  9/21 Cortrak placed  Patient is currently intubated on ventilator support MV: 8.7 L/min Temp (24hrs), Avg:97.8 F (36.6 C), Min:95.4 F (35.2 C), Max:99.3 F (37.4 C)  Pt s/p CABG x 4 and placement of RVAD using CentriMag pump 9/20. Vital 1.5 formula ordered. Currently infusing at 10 ml/hr via Cortrak tube. Not absorbing medications. Medications reviewed and include Reglan, Colace and Lasix. Labs reviewed. Na 133 (L). Ca 98 (L). Mg 2.5 (H). CBG's 125-130-103.  Verbal with Read Back order received per Dr. Donata Clay for TF management.  Diet Order:  Diet NPO time specified  Skin:  Reviewed, no issues  Last BM:  9/18   Intake/Output Summary (Last 24 hours) at 08/29/17 1502 Last data filed at 08/29/17 1500  Gross per 24 hour  Intake          5416.05 ml  Output             5765 ml  Net          -348.95 ml   Height:   Ht Readings from Last 1 Encounters:  08/29/17  (1.803 m)   Weight:   Wt Readings from Last 1 Encounters:  08/29/17 207 lb 3.7 oz (94 kg)   Ideal Body Weight:  78.1 kg  BMI:  Body mass index is 28.9 kg/m.  Estimated Nutritional Needs:   Kcal:  1992  Protein:  120-135 gm  Fluid:  per MD  EDUCATION NEEDS:   No education needs identified at this time  Maureen Chatters, RD, LDN Pager #: [phone] After-Hours Pager #: [phone]

## 2017-08-29 NOTE — Progress Notes (Signed)
.  EKG CRITICAL VALUE     12 lead EKG performed.  Critical value noted. Aundra Millet, RN notified.   SCALES-PRICE,Alitza Cowman, CCT 08/29/2017 06:44AM.

## 2017-08-29 NOTE — Progress Notes (Signed)
Progress Note  Patient Name: Clinton Harrington Date of Encounter: 08/29/2017  Primary Cardiologist: Quay Burow MD  Subjective   Patient sedated but awakes on vent.    Inpatient Medications    Scheduled Meds: . acetaminophen  1,000 mg Oral Q6H   Or  . acetaminophen (TYLENOL) oral liquid 160 mg/5 mL  1,000 mg Per Tube Q6H  . aspirin EC  325 mg Oral Daily   Or  . aspirin  324 mg Per Tube Daily  . atorvastatin  80 mg Oral q1800  . bisacodyl  10 mg Oral Daily   Or  . bisacodyl  10 mg Rectal Daily  . brimonidine  1 drop Right Eye BID  . chlorhexidine gluconate (MEDLINE KIT)  15 mL Mouth Rinse BID  . Chlorhexidine Gluconate Cloth  6 each Topical Q0600  . docusate sodium  200 mg Oral Daily  . insulin regular  0-10 Units Intravenous TID WC  . mouth rinse  15 mL Mouth Rinse 10 times per day  . metoCLOPramide (REGLAN) injection  10 mg Intravenous Q6H  . metoprolol tartrate  12.5 mg Oral BID   Or  . metoprolol tartrate  12.5 mg Per Tube BID  . mupirocin ointment  1 application Topical BID  . [START ON 08/30/2017] pantoprazole  40 mg Oral Daily  . sodium chloride flush  3 mL Intravenous Q12H  . traZODone  100 mg Oral QHS   Continuous Infusions: . sodium chloride 20 mL/hr at 08/29/17 0700  . sodium chloride    . sodium chloride Stopped (08/29/17 0100)  . albumin human    . amiodarone 30 mg/hr (08/29/17 0700)  . cefUROXime (ZINACEF)  IV Stopped (08/28/17 2143)  . dexmedetomidine 1.2 mcg/kg/hr (08/29/17 0700)  . EPINEPHrine 4 mg in dextrose 5% 250 mL infusion (16 mcg/mL) 3 mcg/min (08/29/17 0700)  . fentaNYL infusion INTRAVENOUS 400 mcg/hr (08/29/17 0700)  . insulin (NOVOLIN-R) infusion 2 Units/hr (08/29/17 0700)  . lactated ringers 20 mL/hr at 08/29/17 0700  . lactated ringers    . milrinone 0.3 mcg/kg/min (08/29/17 0700)  . nitroGLYCERIN Stopped (08/28/17 1700)  . norepinephrine (LEVOPHED) Adult infusion 2 mcg/min (08/29/17 0700)  . vancomycin     PRN Meds: sodium  chloride, albumin human, ALPRAZolam, bisacodyl, fentaNYL, fentaNYL (SUBLIMAZE) injection, metoprolol tartrate, midazolam, ondansetron (ZOFRAN) IV, sodium chloride flush   Vital Signs    Vitals:   08/29/17 0625 08/29/17 0630 08/29/17 0645 08/29/17 0700  BP: 112/63   116/62  Pulse: 67 (!) 34 61 (!) 47  Resp: _0 Temp: 98.6 F (37 C) 98.6 F (37 C) 98.4 F (36.9 C) 98.4 F (36.9 C)  TempSrc:    Core (Comment)  SpO2: 99% 100% 99% 100%  Weight:      Height:        Intake/Output Summary (Last 24 hours) at 08/29/17 0732 Last data filed at 08/29/17 0700  Gross per 24 hour  Intake          6980.24 ml  Output             4680 ml  Net          2300.24 ml   Filed Weights   08/26/17 0500 08/28/17 0445 08/29/17 0400  Weight: 208 lb 15.9 oz (94.8 kg) 198 lb 6.6 oz (90 kg) 207 lb 3.7 oz (94 kg)    Telemetry    Junctional rhythm. Pacer not capturing.  Personally Reviewed  ECG    Junctional rhythm.  Inferior infarct. Prolonged QT- Personally Reviewed  Physical Exam   GEN: overweight WM, intubated.  Neck: difficult to assess JVD with thick neck. CVP 15.  Cardiac: RRR, no murmurs, rubs, or gallops. RV assist device in place.  Respiratory: Clear to auscultation bilaterally. GI: Soft, nontender, non-distended  MS: diffuse edema; Right groin with IABP- no hematoma. Right foot warm and dry with palpable pulse. Good left femoral pulse. Left foot cool but perfused.  Neuro:  sedated on vent  PAP 41/22 mm Hg. Index 0.9. Co ox 65%.   Labs    Chemistry  Recent Labs Lab 08/26/17 0456  08/27/17 0414  08/28/17 0350  08/28/17 1242 08/28/17 1331 08/28/17 1516 08/28/17 2200 08/29/17 0228  NA 136  < > 136  < > 133*  < > 132* 133* 136  --  133*  K 3.9  < > 3.5  < > 3.9  < > 4.7 4.0 3.9  --  4.3  CL 100*  < > 97*  < > 91*  < > 91* 92*  --   --  98*  CO2 28  --  31  --  32  --   --   --   --   --  29  GLUCOSE 175*  < > 183*  < > 208*  < > 183* 169* 154*  --  99  BUN 24*  < > 16   < > 20  < > 24* 23*  --   --  18  CREATININE 1.11  < > 1.02  < > 1.04  < > 0.90 1.00  --  1.00 0.92  CALCIUM 8.7*  --  8.8*  --  8.9  --   --   --   --   --  7.6*  PROT 6.4*  --  6.6  --  6.8  --   --   --   --   --   --   ALBUMIN 3.2*  --  3.1*  --  3.1*  --   --   --   --   --   --   AST 51*  --  40  --  27  --   --   --   --   --   --   ALT 25  --  27  --  25  --   --   --   --   --   --   ALKPHOS 50  --  54  --  59  --   --   --   --   --   --   BILITOT 0.6  --  0.6  --  0.6  --   --   --   --   --   --   GFRNONAA >60  --  >60  --  >60  --   --   --   --  >60 >60  GFRAA >60  --  >60  --  >60  --   --   --   --  >60 >60  ANIONGAP 8  --  8  --  10  --   --   --   --   --  6  < > = values in this interval not displayed.   Hematology  Recent Labs Lab 08/28/17 1515  08/28/17 1940 08/28/17 2359 08/29/17 0228  WBC 12.5*  --  12.9*  --  10.3  RBC 3.19*  --  3.12*  --  3.61*  HGB 9.4*  < > 9.4* 10.4* 10.3*  HCT 28.2*  < > 27.5* 30.8* 31.3*  MCV 88.4  --  88.1  --  86.7  MCH 29.5  --  30.1  --  28.5  MCHC 33.3  --  34.2  --  32.9  RDW 13.3  --  13.6  --  13.8  PLT 166  168  --  156  --  150  < > = values in this interval not displayed.  Cardiac Enzymes  Recent Labs Lab 08/23/17 1724 08/23/17 1927 08/24/17 0027 08/24/17 0644  TROPONINI 1.95* 2.63* 3.76* 5.00*   No results for input(s): TROPIPOC in the last 168 hours.   BNP  Recent Labs Lab 08/23/17 1927  BNP 200.0*     DDimer   Recent Labs Lab 08/28/17 1515  DDIMER 0.62*     Radiology    Dg Chest Portable 1 View  Result Date: 08/28/2017 CLINICAL DATA:  Status post CABG.Question pneumothorax. EXAM: PORTABLE CHEST 1 VIEW COMPARISON:  Yesterday FINDINGS: Right upper extremity PICC with tip at the lower right atrium. Aortic balloon pump with tip 3 cm below the lower margin of the aortic knob. Thoracic drains in place. Endotracheal tube with tip between the clavicular heads and carina. Cannulae project over the  bilateral heart. Stable cardiomegaly. Swan-Ganz catheter from right approach with tip directed towards the right main pulmonary artery. Right pleural effusion appears evacuated. There is low lung volumes with atelectasis and edema. No convincing or large pneumothorax. No mediastinal wires seen. IMPRESSION: 1. No convincing or large pneumothorax. 2. Low volume chest with atelectasis and edema. Evidence of interval right pleural effusion evacuation. 3. Lower aortic balloon pump, the tip projects 3 cm below the lower margin of the aortic arch. Other tubes and lines as described. Electronically Signed   By: Monte Fantasia M.D.   On: 08/28/2017 16:06    Cardiac Studies   Echo: 08/23/17: Study Conclusions  - Left ventricle: The cavity size was normal. Wall thickness was   increased in a pattern of moderate LVH. Systolic function was   vigorous. The estimated ejection fraction was in the range of 65%   to 70%. Wall motion was normal; there were no regional wall   motion abnormalities. The study is not technically sufficient to   allow evaluation of LV diastolic function. - Aortic valve: Mildly calcified annulus. Trileaflet; normal   thickness leaflets. Valve area (VTI): 1.95 cm^2. Valve area   (Vmax): 1.73 cm^2. - Right ventricle: The cavity size was moderately dilated. The RV   shares the apex with the LV. Systolic function was moderately to   severely reduced. The RV free wall is hypokinetic. - Inferior vena cava: The vessel was dilated. The respirophasic   diameter changes were blunted (< 50%), consistent with elevated   central venous pressure. - Pericardium, extracardiac: There is a trivial circumferential   pericardial effusion Echodensity attached to the RV apex looks   most consistent with fat pad as it is adherent to the muscle wall   and moves in coordination.  Cardiac cath: 08/23/17: Conclusion     Prox RCA to Mid RCA lesion, 100 %stenosed.  Ost LAD lesion, 90 %stenosed.  Mid  LAD lesion, 75 %stenosed.  1st Diag lesion, 75 %stenosed.   ERIN UECKER is a 66 y.o. male    [id number] LOCATION:  FACILITY: [facility]  PHYSICIAN: Quay Burow, M.D. [birth year]   DATE OF PROCEDURE:  08/23/2017  DATE OF DISCHARGE:     CARDIAC CATHETERIZATION / IABP    History obtained from chart review. Mr. Sugg is a 66 year old mild to moderately overweight Caucasian male formerly a patient of Dr. Lowella Fairy and subsequently Dr. Yolanda Bonine. He has a history of CAD status post myocardial function 1999 with RCA stenting. At that time he had minimal disease in his LAD. He does continue to smoke and history of hypertension, hyperlipidemia and diabetes. He did have critical limb ischemia on the left with a left femoropopliteal bypass grafting. He developed chest pain at 11:00 last night which persisted throughout the night and day today finally arriving at the emergency room late this afternoon with EKG showed subtle anterior ST segment elevation, right bundle branch block which was old. His initial blood pressure at home was 60 mmHg and when he arrived at the emergency room 80 mmHg. He did receive a fluid bolus. It was decided to bring him to the Cath Lab for urgent cath and potential intervention as well as hemodynamic support.    IMPRESSION: Mr. Ottey has severe three-vessel disease with a highly calcified ostial LAD, to the right with left right collaterals and needs AV groove circumflex disease. He has moderately severe renal insufficiency which may be related to ATN from shock. His EDP was only 15. His blood pressure was marginal at between 95 and 100 and I therefore elected to place an intra-aortic balloon pump for hemodynamic support. His pain improved by the end of the procedure. He was on aspirin and Plavix at home. His Plavix has been discontinued. The echo has been ordered. I placed him on IV heparin. He will need coronary artery bypass grafting midweek. He left the  lab in stable condition. I have discussed this case with Dr. Tharon Aquas Trigt. Quay Burow. MD, [employer] 08/23/2017 6:49 PM      Indications   Acute ST elevation myocardial infarction (STEMI) involving left anterior descending (LAD) coronary artery ([place]) [I21.02 (ICD-10-CM)]  Cardiogenic shock ([place]) [R57.0 (ICD-10-CM)]  Procedural Details/Technique   Technical Details PROCEDURE DESCRIPTION:   The patient was brought to the second floor [facility] cath lab in the postabsorptive state. He was not premedicated . His right groin was prepped and shaved in usual sterile fashion. Xylocaine 1% was used for local anesthesia. A 6 upgraded to a 7 [place] sheath was inserted into the Right common femoral artery using standard Seldinger technique. A 5 French left Judkins catheters along with a 5 French pigtail catheter were used for selective coronary angiography, so selective left internal mammary artery angiography, distal abdominal aortography and left heart pressures. Isovue was used for the entirety of the case. Retrograde aortic, left ventricular and pullback pressures were recorded. An intra-aortic balloon pump was placed via the right femoral artery.   Estimated blood loss <50 mL.  During this procedure no sedation was administered.    Coronary Findings   Dominance: Right  Left Anterior Descending  Ost LAD lesion, 90% stenosed. The lesion is concentric. The lesion is calcified.  Mid LAD lesion, 75% stenosed.  First Diagonal Branch  1st Diag lesion, 75% stenosed.  Right Coronary Artery  Prox RCA to Mid RCA lesion, 100% stenosed. The lesion was previously treated.  Right Posterior Descending Artery  Ost RPDA filled by collaterals from Ost 1st Sept.  Impella/IABP   Hemodynamic Support An IABP was inserted for hemodynamic support in the setting of cardiogenic shock. Access site: right femoral artery.    Coronary Diagrams  Diagnostic Diagram          Patient  Profile     66 y.o. male with history of remote inferior MI 1999 with stenting of the mid RCA, PVD with left fem-pop bypass, tobacco abuse, HTN, HLD, and DM type 2 presented with acute inferior infarct associated with cardiogenic shock, RV infarct, junctional rhythm.  Assessment & Plan    1. Acute RV infarct/STEMI - ST elevation V1-3. Troponin peak 5.0. Cardiac cath showed Occluded RCA proximally with collaterals. The LAD was severely diseased in the proximal and mid vessel with heavy calcification. Also moderate disease in proximal LCx extending into OM1 and OM2. PCI not attempted. S/p 4 vessel CABG yesterday. Centimag RV assist device placed. Still with IABP in place.on IV milrinone and norepinephrine.  2. Cardiogenic shock due to RV infarct. Echo reviewed personally. The basal inferior wall is akinetic. Otherwise LV function looks good. The RV is akinetic. On support with IV milrinone and IABP. Now with RV assist device in place post op CABG. 3. Atrial fibrillation with RVR - now in stable junctional rhythm on amiodarone. Has not required pacing.  4. Acute renal failure. Creatinine improved 2.63>>1.6>>1.11>>1.02>> 1.0. Good urine output.  5. PAD s/p left fem-pop BPG. 6. DM type 2. A1c 6%. On IV insulin 8. Hypercholesterolemia. On high dose statin.   The patient is critically ill with multiple organ systems failure and requires high complexity decision making for assessment and support, frequent evaluation and titration of therapies, application of advanced monitoring technologies and extensive interpretation of multiple databases.  For questions or updates, please contact [facility] Please consult [email or web address] for contact info under Cardiology/STEMI.      Signed, Peter Martinique, MD  08/29/2017, 7:32 AM

## 2017-08-29 NOTE — Progress Notes (Signed)
Cortrak Tube Team Note:  Consult received to place a Cortrak feeding tube.   A 10 F Cortrak tube was placed in the R nare and secured with a nasal bridle at 96 cm. Per the Cortrak monitor reading the tube tip is post pyloric.   X-ray is required, abdominal x-ray has been ordered by the Cortrak team. Please confirm tube placement before using the Cortrak tube.   If the tube becomes dislodged please keep the tube and contact the Cortrak team at [email or web address] ([email or web address]) for replacement.  If after hours and replacement cannot be delayed, place a NG tube and confirm placement with an abdominal x-ray.    Kendell Bane RD, LDN, CNSC [phone] Pager [phone] After Hours Pager

## 2017-08-29 NOTE — Progress Notes (Addendum)
ANTICOAGULATION CONSULT NOTE - Follow Up Consult  Pharmacy Consult for heparin Indication: IABP/Centrimag  Allergies  Allergen Reactions  . Aspirin Other (See Comments)    Stomach upset  . Latex Rash  . Oxycodone-Acetaminophen Itching    Patient Measurements: Height:  (180.3 cm) Weight: 207 lb 3.7 oz (94 kg) IBW/kg (Calculated) : 75.3 Heparin Dosing Weight: 90 kg  Vital Signs: Temp: 98.1 F (36.7 C) (09/21 0900) Temp Source: Core (Comment) (09/21 0700) BP: 109/60 (09/21 0900) Pulse Rate: 86 (09/21 0900)  Labs:  Recent Labs  08/27/17 1204  08/27/17 1944  08/28/17 1331 08/28/17 1515  08/28/17 1940 08/28/17 2200 08/28/17 2359 08/29/17 0228  HGB  --   < >  --   < > 9.5* 9.4*  < > 9.4*  --  10.4* 10.3*  HCT  --   < >  --   < > 28.0* 28.2*  < > 27.5*  --  30.8* 31.3*  PLT  --   --   --   < >  --  166  168  --  156  --   --  150  APTT  --   --   --   --   --  35  --   --   --   --  34  LABPROT  --   --   --   --   --  16.9*  --   --   --   --  15.8*  INR  --   --   --   --   --  1.39  --   --   --   --  1.27  HEPARINUNFRC <0.10*  --  0.16*  --   --   --   --   --   --   --  <0.10*  CREATININE  --   < >  --   < > 1.00  --   --   --  1.00  --  0.92  < > = values in this interval not displayed.  Estimated Creatinine Clearance: 96.3 mL/min (by C-G formula based on SCr of 0.92 mg/dL).   Medications:  Heparin 1150 units/hr  Assessment: 40 yoM presented as code STEMI now s/p cath with IABP placed. Pt underwent CABG x4 9/20 requiring Centrimag placement for RV support and continued IABP for LV support now to resume heparin per Centrimag protocol. VAD flow rate currently 4-4.5, targeting ACT of 160-180.  Goal of Therapy:  ACT 160-180 seconds Monitor platelets by anticoagulation protocol: Yes   Plan:  -Start heparin 500 units with no bolus -Follow ACT per nursing protocol -Monitor CBC, S/Sx bleeding  Fredonia Highland, PharmD PGY-2 Cardiology Pharmacy  Resident Pager: [phone] 08/29/2017

## 2017-08-29 NOTE — Progress Notes (Signed)
T[facility] BRIEF SICU PROGRESS NOTE  1 Day Post-Op  S/P Procedure(s) (LRB): CORONARY ARTERY BYPASS GRAFTING (CABG) times four using left internal mammary artery and right greater saphenous leg vein using endoscope. (N/A) TRANSESOPHAGEAL ECHOCARDIOGRAM (TEE) (N/A) PLACEMENT OF CENTRIMAG VENTRICULAR ASSIST DEVICE (Right)   Sedated on vent AV paced w/ stable RVAD flows and hemodynamics although levophed and Neo drips increased for BP support O2 sats 97% on 60% FiO2 Mixed venous co-ox 65% UOP 60-100 mL/hr Chest tube output low Labs okay w/ Hgb 10.1  Plan: Continue current plan  Purcell Nails, MD 08/29/2017 8:31 PM

## 2017-08-29 NOTE — Progress Notes (Signed)
1 Day Post-Op Procedure(s) (LRB): CORONARY ARTERY BYPASS GRAFTING (CABG) times four using left internal mammary artery and right greater saphenous leg vein using endoscope. (N/A) TRANSESOPHAGEAL ECHOCARDIOGRAM (TEE) (N/A) PLACEMENT OF CENTRIMAG VENTRICULAR ASSIST DEVICE (Right) Subjective: Stable overnight after CABG 4, placement of Centrimag right ventricular assist device for preoperative completed RV infarct with cardiogenic shock and preoperative balloon pump. Sternum was not closed due to RV dysfunction and are bad but chest wall soft tissues closed over the sternum  Hemodynamic stable. VAD flow 4.5 L/m. Co-ox greater than 60% Adequate urine output and oxygenation Patient responsive but sedated Low-dose milrinone and norepinephrine.  Heparin started today according to C-mag sliding scale based on ACT and VAD flow rate Balloon pump not pulled the day since patient needs to have IV heparin-will probably pull balloon pump on Monday if patient is stable.  Objective: Vital signs in last 24 hours: Temp:  [95.4 F (35.2 C)-99.3 F (37.4 C)] 99.1 F (37.3 C) (09/21 1600) Pulse Rate:  [32-90] 43 (09/21 1600) Cardiac Rhythm: A-V Sequential paced (09/21 1100) Resp:  [7-20] 19 (09/21 1600) BP: (94-133)/(46-71) 111/55 (09/21 1600) SpO2:  [95 %-100 %] 96 % (09/21 1600) Arterial Line BP: (96-142)/(46-62) 115/48 (09/21 1600) FiO2 (%):  [50 %-70 %] 60 % (09/21 1504) Weight:  [207 lb 3.7 oz (94 kg)] 207 lb 3.7 oz (94 kg) (09/21 0400)  Hemodynamic parameters for last 24 hours: PAP: (28-46)/(17-27) 34/19 CVP:  [10 mmHg-21 mmHg] 11 mmHg CO:  [2.4 L/min-2.9 L/min] 2.9 L/min CI:  [1.1 L/min/m2-1.3 L/min/m2] 1.3 L/min/m2  Intake/Output from previous day: 09/20 0701 - 09/21 0700 In: 6980.2 [I.V.:4732.2; Blood:1078; NG/GT:100; IV Piggyback:1070] Out: 4805 [Urine:2205; Emesis/NG output:150; Blood:1900; Chest Tube:550] Intake/Output this shift: Total I/O In: 1964.9 [I.V.:1477.1; NG/GT:237.8; IV  Piggyback:250] Out: 1880 [Urine:1160; Emesis/NG output:450; Chest Tube:270]       Exam    General- alert and comfortable   Lungs- clear without rales, wheezes   Cor- regular rate and rhythm, no murmur , gallop   Abdomen- soft, non-tender   Extremities - warm, non-tender, minimal edema   Neuro- sedated on ventilator   Lab Results:  Recent Labs  08/29/17 0228 08/29/17 1556 08/29/17 1613  WBC 10.3  --  13.4*  HGB 10.3* 10.2* 10.1*  HCT 31.3* 30.0* 30.7*  PLT 150  --  156   BMET:  Recent Labs  08/28/17 0350  08/29/17 0228 08/29/17 1556 08/29/17 1613  NA 133*  < > 133* 132*  --   K 3.9  < > 4.3 4.7  --   CL 91*  < > 98* 94*  --   CO2 32  --  29  --   --   GLUCOSE 208*  < > 99 205*  --   BUN 20  < > 18 20  --   CREATININE 1.04  < > 0.92 1.00 1.08  CALCIUM 8.9  --  7.6*  --   --   < > = values in this interval not displayed.  PT/INR:  Recent Labs  08/29/17 0228  LABPROT 15.8*  INR 1.27   ABG    Component Value Date/Time   PHART 7.401 08/29/2017 1043   HCO3 30.0 (H) 08/29/2017 1043   TCO2 26 08/29/2017 1556   ACIDBASEDEF 2.0 08/24/2017 0842   O2SAT 64.9 08/29/2017 1600   CBG (last 3)   Recent Labs  08/29/17 1042 08/29/17 1251 08/29/17 1559  GLUCAP 103* 127* 199*    Assessment/Plan: S/P Procedure(s) (LRB): CORONARY ARTERY BYPASS  GRAFTING (CABG) times four using left internal mammary artery and right greater saphenous leg vein using endoscope. (N/A) TRANSESOPHAGEAL ECHOCARDIOGRAM (TEE) (N/A) PLACEMENT OF CENTRIMAG VENTRICULAR ASSIST DEVICE (Right) Diuresis Diabetes control Core track feeding tube placed for nutrition Heparin sliding scale initiated based on ACT, VAD flow rate  LOS: 6 days    Clinton Harrington 08/29/2017

## 2017-08-30 ENCOUNTER — Inpatient Hospital Stay (HOSPITAL_COMMUNITY): Payer: PPO

## 2017-08-30 DIAGNOSIS — E7800 Pure hypercholesterolemia, unspecified: Secondary | ICD-10-CM

## 2017-08-30 LAB — CALCIUM, IONIZED
CALCIUM, IONIZED, SERUM: 4.4 mg/dL — AB (ref 4.5–5.6)
Calcium, Ionized, Serum: 4.1 mg/dL — ABNORMAL LOW (ref 4.5–5.6)

## 2017-08-30 LAB — POCT I-STAT 3, ART BLOOD GAS (G3+)
ACID-BASE DEFICIT: 1 mmol/L (ref 0.0–2.0)
ACID-BASE DEFICIT: 2 mmol/L (ref 0.0–2.0)
Acid-base deficit: 1 mmol/L (ref 0.0–2.0)
Acid-base deficit: 2 mmol/L (ref 0.0–2.0)
Acid-base deficit: 3 mmol/L — ABNORMAL HIGH (ref 0.0–2.0)
Acid-base deficit: 2 mmol/L (ref 0.0–2.0)
BICARBONATE: 26.1 mmol/L (ref 20.0–28.0)
BICARBONATE: 26.4 mmol/L (ref 20.0–28.0)
BICARBONATE: 22.4 mmol/L (ref 20.0–28.0)
BICARBONATE: 23.2 mmol/L (ref 20.0–28.0)
BICARBONATE: 22.6 mmol/L (ref 20.0–28.0)
Bicarbonate: 24.5 mmol/L (ref 20.0–28.0)
O2 SAT: 92 %
O2 Saturation: 84 %
O2 Saturation: 89 %
O2 Saturation: 91 %
O2 Saturation: 93 %
O2 Saturation: 93 %
PCO2 ART: 53 mmHg — AB (ref 32.0–48.0)
PCO2 ART: 56.4 mmHg — AB (ref 32.0–48.0)
PCO2 ART: 49.8 mmHg — AB (ref 32.0–48.0)
PCO2 ART: 38.8 mmHg (ref 32.0–48.0)
PH ART: 7.277 — AB (ref 7.350–7.450)
PH ART: 7.378 (ref 7.350–7.450)
PH ART: 7.388 (ref 7.350–7.450)
PO2 ART: 68 mmHg — AB (ref 83.0–108.0)
PO2 ART: 56 mmHg — AB (ref 83.0–108.0)
PO2 ART: 63 mmHg — AB (ref 83.0–108.0)
PO2 ART: 69 mmHg — AB (ref 83.0–108.0)
Patient temperature: 36.2
Patient temperature: 36.8
Patient temperature: 37.2
Patient temperature: 37.2
TCO2: 28 mmol/L (ref 22–32)
TCO2: 28 mmol/L (ref 22–32)
TCO2: 26 mmol/L (ref 22–32)
TCO2: 24 mmol/L (ref 22–32)
TCO2: 24 mmol/L (ref 22–32)
TCO2: 24 mmol/L (ref 22–32)
pCO2 arterial: 39.6 mmHg (ref 32.0–48.0)
pCO2 arterial: 37.5 mmHg (ref 32.0–48.0)
pH, Arterial: 7.297 — ABNORMAL LOW (ref 7.350–7.450)
pH, Arterial: 7.299 — ABNORMAL LOW (ref 7.350–7.450)
pH, Arterial: 7.371 (ref 7.350–7.450)
pO2, Arterial: 62 mmHg — ABNORMAL LOW (ref 83.0–108.0)
pO2, Arterial: 70 mmHg — ABNORMAL LOW (ref 83.0–108.0)

## 2017-08-30 LAB — POCT ACTIVATED CLOTTING TIME
ACTIVATED CLOTTING TIME: 169 s
ACTIVATED CLOTTING TIME: 175 s
ACTIVATED CLOTTING TIME: 164 s
ACTIVATED CLOTTING TIME: 158 s
ACTIVATED CLOTTING TIME: 158 s
ACTIVATED CLOTTING TIME: 158 s
ACTIVATED CLOTTING TIME: 158 s
ACTIVATED CLOTTING TIME: 164 s
ACTIVATED CLOTTING TIME: 164 s
ACTIVATED CLOTTING TIME: 158 s
ACTIVATED CLOTTING TIME: 164 s
ACTIVATED CLOTTING TIME: 164 s
Activated Clotting Time: 158 seconds
Activated Clotting Time: 158 seconds
Activated Clotting Time: 164 seconds
Activated Clotting Time: 164 seconds
Activated Clotting Time: 164 seconds
Activated Clotting Time: 164 seconds
Activated Clotting Time: 169 seconds
Activated Clotting Time: 169 seconds
Activated Clotting Time: 169 seconds
Activated Clotting Time: 169 seconds
Activated Clotting Time: 169 seconds
Activated Clotting Time: 169 seconds
Activated Clotting Time: 175 seconds

## 2017-08-30 LAB — BASIC METABOLIC PANEL
Anion gap: 8 (ref 5–15)
BUN: 19 mg/dL (ref 6–20)
CALCIUM: 7.3 mg/dL — AB (ref 8.9–10.3)
CO2: 24 mmol/L (ref 22–32)
CREATININE: 1.03 mg/dL (ref 0.61–1.24)
Chloride: 96 mmol/L — ABNORMAL LOW (ref 101–111)
GFR calc Af Amer: >60 mL/min (ref >60)
GFR calc non Af Amer: >60 mL/min (ref >60)
GLUCOSE: 156 mg/dL — AB (ref 65–99)
Potassium: 3.7 mmol/L (ref 3.5–5.1)
Sodium: 128 mmol/L — ABNORMAL LOW (ref 135–145)

## 2017-08-30 LAB — POCT I-STAT, CHEM 8
BUN: 20 mg/dL (ref 6–20)
BUN: 20 mg/dL (ref 6–20)
CHLORIDE: 91 mmol/L — AB (ref 101–111)
CREATININE: 1 mg/dL (ref 0.61–1.24)
Calcium, Ion: 1.08 mmol/L — ABNORMAL LOW (ref 1.15–1.40)
Calcium, Ion: 1.09 mmol/L — ABNORMAL LOW (ref 1.15–1.40)
Chloride: 94 mmol/L — ABNORMAL LOW (ref 101–111)
Creatinine, Ser: 1 mg/dL (ref 0.61–1.24)
Glucose, Bld: 162 mg/dL — ABNORMAL HIGH (ref 65–99)
Glucose, Bld: 178 mg/dL — ABNORMAL HIGH (ref 65–99)
HEMATOCRIT: 25 % — AB (ref 39.0–52.0)
HEMATOCRIT: 26 % — AB (ref 39.0–52.0)
HEMOGLOBIN: 8.5 g/dL — AB (ref 13.0–17.0)
Hemoglobin: 8.8 g/dL — ABNORMAL LOW (ref 13.0–17.0)
POTASSIUM: 4 mmol/L (ref 3.5–5.1)
POTASSIUM: 3.7 mmol/L (ref 3.5–5.1)
SODIUM: 130 mmol/L — AB (ref 135–145)
Sodium: 128 mmol/L — ABNORMAL LOW (ref 135–145)
TCO2: 23 mmol/L (ref 22–32)
TCO2: 22 mmol/L (ref 22–32)

## 2017-08-30 LAB — COMPREHENSIVE METABOLIC PANEL
ALT: 17 U/L (ref 17–63)
AST: 31 U/L (ref 15–41)
Albumin: 2.4 g/dL — ABNORMAL LOW (ref 3.5–5.0)
Alkaline Phosphatase: 58 U/L (ref 38–126)
Anion gap: 6 (ref 5–15)
BUN: 20 mg/dL (ref 6–20)
CO2: 25 mmol/L (ref 22–32)
Calcium: 7.3 mg/dL — ABNORMAL LOW (ref 8.9–10.3)
Chloride: 95 mmol/L — ABNORMAL LOW (ref 101–111)
Creatinine, Ser: 0.94 mg/dL (ref 0.61–1.24)
GFR calc Af Amer: >60 mL/min (ref >60)
GFR calc non Af Amer: >60 mL/min (ref >60)
Glucose, Bld: 300 mg/dL — ABNORMAL HIGH (ref 65–99)
Potassium: 4.4 mmol/L (ref 3.5–5.1)
Sodium: 126 mmol/L — ABNORMAL LOW (ref 135–145)
Total Bilirubin: 0.7 mg/dL (ref 0.3–1.2)
Total Protein: 5.3 g/dL — ABNORMAL LOW (ref 6.5–8.1)

## 2017-08-30 LAB — CBC
HEMATOCRIT: 28.1 % — AB (ref 39.0–52.0)
HEMOGLOBIN: 9.3 g/dL — AB (ref 13.0–17.0)
MCH: 29.2 pg (ref 26.0–34.0)
MCHC: 33.1 g/dL (ref 30.0–36.0)
MCV: 88.4 fL (ref 78.0–100.0)
Platelets: 129 10*3/uL — ABNORMAL LOW (ref 150–400)
RBC: 3.18 MIL/uL — AB (ref 4.22–5.81)
RDW: 14.1 % (ref 11.5–15.5)
WBC: 11.6 10*3/uL — AB (ref 4.0–10.5)

## 2017-08-30 LAB — PHOSPHORUS: Phosphorus: 2.7 mg/dL (ref 2.5–4.6)

## 2017-08-30 LAB — GLUCOSE, CAPILLARY
GLUCOSE-CAPILLARY: 130 mg/dL — AB (ref 65–99)
GLUCOSE-CAPILLARY: 142 mg/dL — AB (ref 65–99)
GLUCOSE-CAPILLARY: 155 mg/dL — AB (ref 65–99)
GLUCOSE-CAPILLARY: 156 mg/dL — AB (ref 65–99)
GLUCOSE-CAPILLARY: 158 mg/dL — AB (ref 65–99)
GLUCOSE-CAPILLARY: 161 mg/dL — AB (ref 65–99)
GLUCOSE-CAPILLARY: 166 mg/dL — AB (ref 65–99)
GLUCOSE-CAPILLARY: 204 mg/dL — AB (ref 65–99)
GLUCOSE-CAPILLARY: 215 mg/dL — AB (ref 65–99)
GLUCOSE-CAPILLARY: 222 mg/dL — AB (ref 65–99)
GLUCOSE-CAPILLARY: 262 mg/dL — AB (ref 65–99)
GLUCOSE-CAPILLARY: 272 mg/dL — AB (ref 65–99)
GLUCOSE-CAPILLARY: 281 mg/dL — AB (ref 65–99)
GLUCOSE-CAPILLARY: 282 mg/dL — AB (ref 65–99)
GLUCOSE-CAPILLARY: 351 mg/dL — AB (ref 65–99)
Glucose-Capillary: 339 mg/dL — ABNORMAL HIGH (ref 65–99)
Glucose-Capillary: 315 mg/dL — ABNORMAL HIGH (ref 65–99)
Glucose-Capillary: 247 mg/dL — ABNORMAL HIGH (ref 65–99)
Glucose-Capillary: 191 mg/dL — ABNORMAL HIGH (ref 65–99)
Glucose-Capillary: 141 mg/dL — ABNORMAL HIGH (ref 65–99)
Glucose-Capillary: 146 mg/dL — ABNORMAL HIGH (ref 65–99)
Glucose-Capillary: 174 mg/dL — ABNORMAL HIGH (ref 65–99)

## 2017-08-30 LAB — ECHO TEE
AO mean calculated velocity dopler: 87.5 cm/s
AV Mean grad: 4 mmHg
AV Peak grad: 11 mmHg
AV pk vel: 163 cm/s
LVOT area: 3.46 cm2
LVOT diameter: 21 mm
VTI: 22.7 cm

## 2017-08-30 LAB — HEPARIN LEVEL (UNFRACTIONATED): Heparin Unfractionated: 0.25 IU/mL — ABNORMAL LOW (ref 0.30–0.70)

## 2017-08-30 LAB — COOXEMETRY PANEL
Carboxyhemoglobin: 1.3 % (ref 0.5–1.5)
Methemoglobin: 1.1 % (ref 0.0–1.5)
O2 Saturation: 66.1 %
Total hemoglobin: 9.2 g/dL — ABNORMAL LOW (ref 12.0–16.0)

## 2017-08-30 LAB — CORTISOL: Cortisol, Plasma: 9.6 ug/dL

## 2017-08-30 LAB — MAGNESIUM: Magnesium: 1.6 mg/dL — ABNORMAL LOW (ref 1.7–2.4)

## 2017-08-30 LAB — PROTIME-INR
INR: 1.61
Prothrombin Time: 19 seconds — ABNORMAL HIGH (ref 11.4–15.2)

## 2017-08-30 LAB — LACTATE DEHYDROGENASE: LDH: 269 U/L — ABNORMAL HIGH (ref 98–192)

## 2017-08-30 MED ORDER — PANTOPRAZOLE SODIUM 40 MG IV SOLR
40.0000 mg | Freq: Two times a day (BID) | INTRAVENOUS | Status: DC
  Administered 2017-08-30 – 2017-09-07 (×17): 40 mg via INTRAVENOUS
  Filled 2017-08-30 (×18): qty 40

## 2017-08-30 MED ORDER — POTASSIUM CHLORIDE 10 MEQ/50ML IV SOLN
10.0000 meq | INTRAVENOUS | Status: AC
  Administered 2017-08-30 – 2017-08-31 (×3): 10 meq via INTRAVENOUS
  Filled 2017-08-30 (×3): qty 50

## 2017-08-30 MED ORDER — DEXTROSE 5 % IV SOLN
1.0000 g | Freq: Three times a day (TID) | INTRAVENOUS | Status: DC
  Administered 2017-08-30 – 2017-09-05 (×18): 1 g via INTRAVENOUS
  Filled 2017-08-30 (×19): qty 1

## 2017-08-30 MED ORDER — NOREPINEPHRINE BITARTRATE 1 MG/ML IV SOLN
0.0000 ug/min | INTRAVENOUS | Status: DC
  Administered 2017-08-30: 12 ug/min via INTRAVENOUS
  Administered 2017-08-31: 40 ug/min via INTRAVENOUS
  Administered 2017-08-31: 25 ug/min via INTRAVENOUS
  Filled 2017-08-30 (×5): qty 16

## 2017-08-30 MED ORDER — ALBUMIN HUMAN 5 % IV SOLN
INTRAVENOUS | Status: AC
  Filled 2017-08-30: qty 250

## 2017-08-30 MED ORDER — AMIODARONE LOAD VIA INFUSION
150.0000 mg | Freq: Once | INTRAVENOUS | Status: AC
  Administered 2017-08-30: 150 mg via INTRAVENOUS

## 2017-08-30 MED ORDER — FUROSEMIDE 10 MG/ML IJ SOLN
15.0000 mg/h | INTRAVENOUS | Status: DC
  Administered 2017-08-30: 5 mg/h via INTRAVENOUS
  Administered 2017-08-31 – 2017-09-01 (×2): 15 mg/h via INTRAVENOUS
  Administered 2017-09-01: 7 mg/h via INTRAVENOUS
  Administered 2017-09-02 – 2017-09-03 (×2): 12 mg/h via INTRAVENOUS
  Administered 2017-09-03: 15 mg/h via INTRAVENOUS
  Filled 2017-08-30 (×6): qty 25
  Filled 2017-08-30: qty 21
  Filled 2017-08-30 (×4): qty 25

## 2017-08-30 MED ORDER — MAGNESIUM SULFATE 4 GM/100ML IV SOLN
4.0000 g | Freq: Once | INTRAVENOUS | Status: AC
  Administered 2017-08-30: 4 g via INTRAVENOUS
  Filled 2017-08-30: qty 100

## 2017-08-30 NOTE — Progress Notes (Signed)
Recruitment Maneuver on ventilator performed per MD request. (PC 25, Peep 5, I-time 3.0 sec, Rate 10) for 2 minutes. VS within normal limits, No distress noted. Pt placed back on PRVC per MD request (changed from SIMV/PRVC previous mode) also increased fio2 to 80% based on current ABG

## 2017-08-30 NOTE — Progress Notes (Signed)
Pt rhythm converted to afib RVR. Pacer turned down to VVI backup. Dr. Cornelius Moras notified, order received for 1 time amiodarone bolus 150 mg. Orders enacted. Pt converted to an accelerated junctional rhythm. Pacer turned back to previous settings. Hemodynamics stable. Will continue to closely monitor.  Herma Ard, RN

## 2017-08-30 NOTE — Consult Note (Signed)
PULMONARY / CRITICAL CARE MEDICINE   Name: GAYLON BENTZ MRN: [id number] DOB: 05-31-1951    ADMISSION DATE:  08/23/2017 CONSULTATION DATE:  08/30/17  REFERRING MD: Dr Cornelius Moras  CHIEF COMPLAINT:  Acute resp failure, hypoxia, hypercapnia  HISTORY OF PRESENT ILLNESS:  Patient is a 66 y/o Caucasian M with hx CADs/p BMS to RCA in 1999 (due to an inferior STEMI), significant PVD, HTN, Type 2 DM (non-insulin dependent), HLP, Tobacco use comes in today complaining of substernal chest pain on admission.He continued to have active CP and the patient was taken to cath lab for further intervention. FOund with RV infacrct and required emergent bypass surgery. Remained in cardiogenic shock requiring balloon pump and placement of RVAD device. Remained on vent with acute resp failure, skin closed but sternum is not closed.He has developed worsening acidosis and hypoxia with bilateral int infiltrates. He has been pos 4 liters last 2 4 hours. He remains on levophed and milrinone. Asked to assist in critical care.   PAST MEDICAL HISTORY :  He  has a past medical history of Arthritis; Diabetes (HCC); Diabetes mellitus (Jan. 1999); High cholesterol; Hypertension; Myocardial infarct (HCC); Peripheral vascular disease (HCC); Seasonal allergies; Smoker; and Urine incontinence.  PAST SURGICAL HISTORY: He  has a past surgical history that includes Coronary stent placement (Jan.  1999); Cardiac catheterization (N/A, 01/01/2016); Cardiac catheterization (Left, 01/01/2016); Femoral-popliteal Bypass Graft (Left, 01/03/2016); Intraoperative arteriogram (Left, 01/03/2016); Cardiac catheterization (N/A, 12/04/2016); Cardiac catheterization (Left, 12/04/2016); LEFT HEART CATH AND CORS/GRAFTS ANGIOGRAPHY (N/A, 08/23/2017); IABP Insertion (N/A, 08/23/2017); Coronary artery bypass graft (N/A, 08/28/2017); TEE without cardioversion (N/A, 08/28/2017); and Placement of centrimag ventricular assist device (Right, 08/28/2017).  Allergies   Allergen Reactions  . Aspirin Other (See Comments)    Stomach upset  . Latex Rash  . Oxycodone-Acetaminophen Itching    No current facility-administered medications on file prior to encounter.    Current Outpatient Prescriptions on File Prior to Encounter  Medication Sig  . acetaminophen (TYLENOL) 650 MG CR tablet Take 1,300 mg by mouth 2 (two) times daily.  Marland Kitchen amLODipine (NORVASC) 5 MG tablet Take 5 mg by mouth daily.  . carvedilol (COREG) 3.125 MG tablet Take 1 tablet (3.125 mg total) by mouth 2 (two) times daily with a meal.  . clopidogrel (PLAVIX) 75 MG tablet Take 1 tablet (75 mg total) by mouth daily.  Marland Kitchen gabapentin (NEURONTIN) 300 MG capsule Take 900 mg by mouth 3 (three) times daily.   Marland Kitchen glimepiride (AMARYL) 4 MG tablet Take 4 mg by mouth 2 (two) times daily.  Marland Kitchen lisinopril (PRINIVIL,ZESTRIL) 40 MG tablet Take 1 tablet (40 mg total) by mouth daily.  . metFORMIN (GLUCOPHAGE) 1000 MG tablet Take 1 tablet (1,000 mg total) by mouth 2 (two) times daily with a meal.  . traMADol (ULTRAM) 50 MG tablet Take 1 tablet (50 mg total) by mouth every 6 (six) hours as needed for moderate pain.  . traZODone (DESYREL) 100 MG tablet Take 100 mg by mouth at bedtime.    FAMILY HISTORY:  His indicated that the status of his mother is unknown. He indicated that the status of his maternal grandmother is unknown.    SOCIAL HISTORY: He  reports that he has been smoking Cigarettes.  He has a 22.50 pack-year smoking history. He has never used smokeless tobacco. He reports that he does not drink alcohol or use drugs.  REVIEW OF SYSTEMS:   Unable to obtain, pt sedated and vented,no family  SUBJECTIVE:  80% O2 Ph 7.28  VITAL SIGNS: BP (!) 107/56   Pulse 87   Temp 98.2 F (36.8 C)   Resp 18   Ht  (1.803 m)   Wt 98.2 kg (216 lb 7.9 oz)   SpO2 97%   BMI 30.19 kg/m   HEMODYNAMICS: PAP: (29-50)/(18-36) 38/21 CVP:  [10 mmHg-28 mmHg] 15 mmHg CO:  [2.5 L/min-3.6 L/min] 3.6 L/min CI:  [1.2  L/min/m2-1.7 L/min/m2] 1.7 L/min/m2  VENTILATOR SETTINGS: Vent Mode: SIMV;PRVC FiO2 (%):  [60 %-70 %] 70 % Set Rate:  [12 bmp] 12 bmp Vt Set:  [650 mL] 650 mL PEEP:  [5 cmH20] 5 cmH20 Pressure Support:  [10 cmH20] 10 cmH20 Plateau Pressure:  [15 cmH20-31 cmH20] 31 cmH20  INTAKE / OUTPUT: I/O last 3 completed shifts: In: 9033.1 [I.V.:6670.3; Blood:315; NG/GT:1227.8; IV Piggyback:820] Out: 5740 [Urine:3175; Emesis/NG output:1425; Chest Tube:1140]  PHYSICAL EXAMINATION: General: precedex, rass-3 Neuro:  perrl rass -3 HEENT:  Jvd, ett Cardiovascular: s1 s2 click rrr distant Lungs:  Crackles, bilateral Abdomen:  Soft, support hoses, BS low Musculoskeletal:  edema Skin: no rash  LABS:  BMET  Recent Labs Lab 08/28/17 0350  08/29/17 0228 08/29/17 1556 08/29/17 1613 08/30/17 0254  NA 133*  < > 133* 132*  --  126*  K 3.9  < > 4.3 4.7  --  4.4  CL 91*  < > 98* 94*  --  95*  CO2 32  --  29  --   --  25  BUN 20  < > 18 20  --  20  CREATININE 1.04  < > 0.92 1.00 1.08 0.94  GLUCOSE 208*  < > 99 205*  --  300*  < > = values in this interval not displayed.  Electrolytes  Recent Labs Lab 08/28/17 0350 08/28/17 2200 08/29/17 0228 08/29/17 1613 08/30/17 0254  CALCIUM 8.9  --  7.6*  --  7.3*  MG  --  2.7* 2.5* 1.9  --     CBC  Recent Labs Lab 08/29/17 0228 08/29/17 1556 08/29/17 1613 08/30/17 0254  WBC 10.3  --  13.4* 11.6*  HGB 10.3* 10.2* 10.1* 9.3*  HCT 31.3* 30.0* 30.7* 28.1*  PLT 150  --  156 129*    Coag's  Recent Labs Lab 08/23/17 1724  08/28/17 1515 08/29/17 0228 08/30/17 0254  APTT 28  --  35 34  --   INR 1.11  < > 1.39 1.27 1.61  < > = values in this interval not displayed.  Sepsis Markers  Recent Labs Lab 08/23/17 2127 08/24/17 0027 08/24/17 0327  LATICACIDVEN 2.6* 2.4* 2.1*    ABG  Recent Labs Lab 08/29/17 0340 08/29/17 1043 08/30/17 0303  PHART 7.377 7.401 7.297*  PCO2ART 48.1* 48.1* 53.0*  PO2ART 76.0* 68.0* 68.0*     Liver Enzymes  Recent Labs Lab 08/27/17 0414 08/28/17 0350 08/30/17 0254  AST 40 27 31  ALT ALKPHOS 54 59 58  BILITOT 0.6 0.6 0.7  ALBUMIN 3.1* 3.1* 2.4*    Cardiac Enzymes  Recent Labs Lab 08/23/17 1927 08/24/17 0027 08/24/17 0644  TROPONINI 2.63* 3.76* 5.00*    Glucose  Recent Labs Lab 08/30/17 0356 08/30/17 0458 08/30/17 0557 08/30/17 0701 08/30/17 0805 08/30/17 0900  GLUCAP 272* 262* 282* 247* 222* 204*    Imaging Dg Chest Port 1 View  Result Date: 08/30/2017 CLINICAL DATA:  Status post coronary bypass grafting EXAM: PORTABLE CHEST 1 VIEW COMPARISON:  08/29/2017 FINDINGS: Cardiac shadow remains enlarged. Changes consistent with coronary bypass  grafting are noted. Swan-Ganz catheter is noted in the pulmonary outflow tract. Intra-aortic balloon pump is seen in the descending aorta below the aortic knob. Bilateral chest tubes are seen as well as mediastinal drain. Endotracheal tube and nasogastric catheter are noted in satisfactory position. Right ventricular assist device cannulas are noted as well. The lungs are hypo aerated bilaterally with bibasilar atelectatic changes. No pneumothorax is seen. Right-sided PICC line is again noted. IMPRESSION: Tubes and lines as described above to include a right ventricular assist device. New bibasilar atelectatic changes likely related to the poor inspiratory effort. Electronically Signed   By: Alcide Clever M.D.   On: 08/30/2017 08:10   Dg Abd Portable 1v  Result Date: 08/29/2017 CLINICAL DATA:  Cortrak feeding tube placement. EXAM: PORTABLE ABDOMEN - 1 VIEW COMPARISON:  None. FINDINGS: Feeding tube enters the stomach, passes into the duodenum and has its tip at the ligament of Treitz. Nasogastric tube is present in the stomach. IMPRESSION: Feeding tube tip at the ligament of Treitz. Electronically Signed   By: Paulina Fusi M.D.   On: 08/29/2017 12:48     STUDIES:  cath  CULTURES: 9/22  sputum>>>  ANTIBIOTICS: zinacef >>>9/22 ceftaz 9/22>>> Vanc>>>  SIGNIFICANT EVENTS: Cath cabg RV assist device  LINES/TUBES: ETT 9/22>>>  ASSESSMENT / PLAN:  PULMONARY A: Acute resp failure, PULM edema,at risk ALI Open chest sternum R/o PNA P:   pcxr c/w pulm edema with some slight patchy infiltrates I do not believe this is ALI / ARDS We need neg balance as he requires 80% and pao2 at 62 Given rv failure would limit peep to 10 Goal is to 70% I am okay with current TV at 8 cc/kg but in am would shooot for 7 cc/kg if we have room with rate increase Goal Ph is normal to avoid worsening shock from acidosis ABg repeat  CARDIOVASCULAR A:  Rv failure, cardiogenic shock, s/p CABG from inf MI P:  Milrinone per cvts Levophed to MAP goals consider cortisol response assessment Balloon pump assist per cards amio  RENAL A:   Pulm edema P:   In setting shock, would add lasix infusion Pharmacy needs to concentrate input as able, at over 200 cc per hour now Goal neg 1 liter next 24 hours On lasix drip will assess lytes q12h  GASTROINTESTINAL A:   Nutritio needs P:   per CVTS Consider ppi lft  HEMATOLOGIC A:   VAD P:  Anticoagulation per cvts  INFECTIOUS A:   R/o PNA P:   Low suspicion for consider simple change to current abx to include improved gram neg nosocomial coverage ceftaz Maintain vanc Sputum send  ENDOCRINE A:   Hyperglycemia, r/o AI P:   cbg ssi cortisol  NEUROLOGIC A:   Vent dyschrony P:   RASS goal: -2 precedex fent wua when able   FAMILY  - Updates: no family  - Inter-disciplinary family meet or Palliative Care meeting due by:   7 days  Ccm time 45 min  Mcarthur Rossetti. Tyson Alias, MD, FACP Pgr: [phone] [facility]  Pulmonary and Critical Care Medicine [facility] [place] Pager: [phone]  08/30/2017, 9:58 AM

## 2017-08-30 NOTE — Progress Notes (Signed)
[facility] BRIEF SICU PROGRESS NOTE  2 Days Post-Op  S/P Procedure(s) (LRB): CORONARY ARTERY BYPASS GRAFTING (CABG) times four using left internal mammary artery and right greater saphenous leg vein using endoscope. (N/A) TRANSESOPHAGEAL ECHOCARDIOGRAM (TEE) (N/A) PLACEMENT OF CENTRIMAG VENTRICULAR ASSIST DEVICE (Right)   Reasonably stable/improved AV paced w/ stable hemodynamics and RVAD flows Oxygenation improved Acidosis corrected Diuresing well on lasix drip  Plan: Continue current plan  Purcell Nails, MD 08/30/2017 4:30 PM

## 2017-08-30 NOTE — Progress Notes (Signed)
ANTICOAGULATION CONSULT NOTE - Follow Up Consult  Pharmacy Consult for heparin Indication: IABP/Centrimag  Allergies  Allergen Reactions  . Aspirin Other (See Comments)    Stomach upset  . Latex Rash  . Oxycodone-Acetaminophen Itching    Patient Measurements: Height:  (180.3 cm) Weight: 216 lb 7.9 oz (98.2 kg) IBW/kg (Calculated) : 75.3 Heparin Dosing Weight: 90 kg  Vital Signs: Temp: 97.3 F (36.3 C) (09/22 0715) Temp Source: Core (Comment) (09/22 0400) BP: 97/59 (09/22 0700) Pulse Rate: 90 (09/22 0715)  Labs:  Recent Labs  08/27/17 1944  08/28/17 1515  08/29/17 0228 08/29/17 1556 08/29/17 1613 08/30/17 0254  HGB  --   < > 9.4*  < > 10.3* 10.2* 10.1* 9.3*  HCT  --   < > 28.2*  < > 31.3* 30.0* 30.7* 28.1*  PLT  --   < > 166  168  < > 150  --  156 129*  APTT  --   --  35  --  34  --   --   --   LABPROT  --   --  16.9*  --  15.8*  --   --  19.0*  INR  --   --  1.39  --  1.27  --   --  1.61  HEPARINUNFRC 0.16*  --   --   --  <0.10*  --   --  0.25*  CREATININE  --   < >  --   < > 0.92 1.00 1.08 0.94  < > = values in this interval not displayed.  Estimated Creatinine Clearance: 96.1 mL/min (by C-G formula based on SCr of 0.94 mg/dL).     Assessment: 69 yoM presented as code STEMI now s/p cath with IABP placed an patient underwent CABG x4 9/20 requiring Centrimag placement for RV support and continued IABP for LV support.  Resumed heparin drip for Centrimag protocol. VAD flow rate currently 4-4.5, targeting ACT of 160-180.  Heparin drip 1500 uts/hr ACT 170s.  CBC stable but RN noticed slightly more CT output overnight  - will discuss AC goal with MD.  Goal of Therapy:  ACT 160-180 seconds Monitor platelets by anticoagulation protocol: Yes   Plan:  Continue heparin drip 1500 uts/hr -Follow ACT per nursing protocol -Monitor CBC, S/Sx bleeding  Leota Sauers Pharm.D. CPP, BCPS Clinical Pharmacist [phone] 08/30/2017 8:24 AM

## 2017-08-30 NOTE — Progress Notes (Addendum)
Progress Note  Patient Name: Clinton Harrington Date of Encounter: 08/30/2017  Primary Cardiologist: Lanier Prude, MD  Subjective   Unable to obtain.  Sedated.   Inpatient Medications    Scheduled Meds: . acetaminophen  1,000 mg Oral Q6H   Or  . acetaminophen (TYLENOL) oral liquid 160 mg/5 mL  1,000 mg Per Tube Q6H  . aspirin EC  325 mg Oral Daily   Or  . aspirin  324 mg Per Tube Daily  . atorvastatin  80 mg Oral q1800  . bisacodyl  10 mg Oral Daily   Or  . bisacodyl  10 mg Rectal Daily  . brimonidine  1 drop Right Eye BID  . chlorhexidine gluconate (MEDLINE KIT)  15 mL Mouth Rinse BID  . Chlorhexidine Gluconate Cloth  6 each Topical Q0600  . docusate  200 mg Per Tube Daily  . feeding supplement (PRO-STAT SUGAR FREE 64)  30 mL Per Tube TID  . furosemide  20 mg Intravenous BID  . insulin aspart  0-24 Units Subcutaneous Q4H  . insulin detemir  15 Units Subcutaneous Daily  . mouth rinse  15 mL Mouth Rinse 10 times per day  . metoCLOPramide (REGLAN) injection  10 mg Intravenous Q6H  . metoprolol tartrate  12.5 mg Oral BID   Or  . metoprolol tartrate  12.5 mg Per Tube BID  . mupirocin ointment  1 application Topical BID  . pantoprazole  40 mg Oral Daily  . sodium chloride flush  10-40 mL Intracatheter Q12H  . sodium chloride flush  3 mL Intravenous Q12H  . traZODone  100 mg Oral QHS   Continuous Infusions: . sodium chloride 20 mL/hr at 08/30/17 0700  . sodium chloride    . sodium chloride Stopped (08/29/17 0100)  . amiodarone 30 mg/hr (08/30/17 0700)  . cefUROXime (ZINACEF)  IV Stopped (08/29/17 2030)  . dexmedetomidine 1.2 mcg/kg/hr (08/30/17 0700)  . EPINEPHrine 4 mg in dextrose 5% 250 mL infusion (16 mcg/mL) 4 mcg/min (08/30/17 0700)  . feeding supplement (VITAL 1.5 CAL) 1,000 mL (08/30/17 0700)  . fentaNYL infusion INTRAVENOUS 400 mcg/hr (08/30/17 0700)  . heparin 1,500 Units/hr (08/30/17 0700)  . insulin (NOVOLIN-R) infusion 21.1 Units/hr (08/30/17 0807)  .  lactated ringers 20 mL/hr at 08/30/17 0700  . lactated ringers    . milrinone 0.3 mcg/kg/min (08/30/17 0700)  . norepinephrine (LEVOPHED) Adult infusion 10 mcg/min (08/30/17 0700)  . vancomycin Stopped (08/29/17 2130)   PRN Meds: sodium chloride, ALPRAZolam, bisacodyl, fentaNYL, fentaNYL (SUBLIMAZE) injection, metoprolol tartrate, midazolam, ondansetron (ZOFRAN) IV, sodium chloride flush, sodium chloride flush   Vital Signs    Vitals:   08/30/17 0630 08/30/17 0645 08/30/17 0700 08/30/17 0715  BP:   (!) 97/59   Pulse: 86  89 90  Resp: _0 Temp: (!) 97.5 F (36.4 C) (!) 97.5 F (36.4 C) (!) 97.3 F (36.3 C) (!) 97.3 F (36.3 C)  TempSrc:      SpO2: 96%  97% 96%  Weight:   98.2 kg (216 lb 7.9 oz)   Height:        Intake/Output Summary (Last 24 hours) at 08/30/17 0808 Last data filed at 08/30/17 0700  Gross per 24 hour  Intake          5994.86 ml  Output             4065 ml  Net          1929.86 ml   [employer]  08/29/17 0400 08/30/17 0600 08/30/17 0700  Weight: 94 kg (207 lb 3.7 oz) 98.2 kg (216 lb 7.9 oz) 98.2 kg (216 lb 7.9 oz)    Telemetry    AP, VP.  PVCs - Personally Reviewed  ECG    n/a - Personally Reviewed  Physical Exam   GEN: Critically ill-appearing.  No acute distress.   Neck: Difficult to assess JVD. Cardiac: RRR, no murmurs, rubs, or gallops.  Normal S1/S2.  Respiratory: Clear to auscultation bilaterally on anterior exam.  GI: Soft, non-distended.  +BS MS: 2+ edema; No deformity. Neuro:  Responds to voice and touch. Psych: Unable to assess.  Sedated.   Labs    Chemistry Recent Labs Lab 08/27/17 0414  08/28/17 0350  08/29/17 0228 08/29/17 1556 08/29/17 1613 08/30/17 0254  NA 136  < > 133*  < > 133* 132*  --  126*  K 3.5  < > 3.9  < > 4.3 4.7  --  4.4  CL 97*  < > 91*  < > 98* 94*  --  95*  CO2 31  --  32  --  29  --   --  25  GLUCOSE 183*  < > 208*  < > 99 205*  --  300*  BUN 16  < > 20  < > 18 20  --  20  CREATININE  1.02  < > 1.04  < > 0.92 1.00 1.08 0.94  CALCIUM 8.8*  --  8.9  --  7.6*  --   --  7.3*  PROT 6.6  --  6.8  --   --   --   --  5.3*  ALBUMIN 3.1*  --  3.1*  --   --   --   --  2.4*  AST 40  --  27  --   --   --   --  31  ALT 27  --  25  --   --   --   --  17  ALKPHOS 54  --  59  --   --   --   --  58  BILITOT 0.6  --  0.6  --   --   --   --  0.7  GFRNONAA >60  --  >60  < > >60  --  >60 >60  GFRAA >60  --  >60  < > >60  --  >60 >60  ANIONGAP 8  --  10  --  6  --   --  6  < > = values in this interval not displayed.   Hematology Recent Labs Lab 08/29/17 0228 08/29/17 1556 08/29/17 1613 08/30/17 0254  WBC 10.3  --  13.4* 11.6*  RBC 3.61*  --  3.46* 3.18*  HGB 10.3* 10.2* 10.1* 9.3*  HCT 31.3* 30.0* 30.7* 28.1*  MCV 86.7  --  88.7 88.4  MCH 28.5  --  29.2 29.2  MCHC 32.9  --  32.9 33.1  RDW 13.8  --  14.1 14.1  PLT 150  --  156 129*    Cardiac Enzymes Recent Labs Lab 08/23/17 1724 08/23/17 1927 08/24/17 0027 08/24/17 0644  TROPONINI 1.95* 2.63* 3.76* 5.00*   No results for input(s): TROPIPOC in the last 168 hours.   BNP Recent Labs Lab 08/23/17 1927  BNP 200.0*     DDimer  Recent Labs Lab 08/28/17 1515  DDIMER 0.62*     Radiology    Dg Chest [facility]  Result Date: 08/29/2017 CLINICAL DATA:  Hypoxia EXAM: PORTABLE CHEST 1 VIEW COMPARISON:  August 28, 2017 FINDINGS: Endotracheal tube tip is 4.0 cm above the carina. Intra-aortic balloon pump tip is in the proximal descending thoracic aorta. Its tip is at the level of the carina. Nasogastric tube tip and side port are below the diaphragm. Swan-Ganz catheter tip is in the main pulmonary outflow tract. There is a mediastinal drain as well as chest tubes bilaterally. Right central catheter tip is in the superior vena cava. No pneumothorax. There is a small left pleural effusion. There is left base atelectasis. Lungs elsewhere clear. Heart is mildly enlarged with pulmonary vascularity showing evidence of pulmonary  venous hypertension. There is aortic atherosclerosis. No adenopathy. No bone lesions. IMPRESSION: Tube and catheter positions as described without evident pneumothorax. Pulmonary vascular congestion. Small left pleural effusion with left base atelectasis. No consolidation. There is aortic atherosclerosis. Aortic Atherosclerosis (ICD10-I70.0). Electronically Signed   By: Lowella Grip III M.D.   On: 08/29/2017 08:06   Dg Chest Portable 1 View  Result Date: 08/28/2017 CLINICAL DATA:  Status post CABG.Question pneumothorax. EXAM: PORTABLE CHEST 1 VIEW COMPARISON:  Yesterday FINDINGS: Right upper extremity PICC with tip at the lower right atrium. Aortic balloon pump with tip 3 cm below the lower margin of the aortic knob. Thoracic drains in place. Endotracheal tube with tip between the clavicular heads and carina. Cannulae project over the bilateral heart. Stable cardiomegaly. Swan-Ganz catheter from right approach with tip directed towards the right main pulmonary artery. Right pleural effusion appears evacuated. There is low lung volumes with atelectasis and edema. No convincing or large pneumothorax. No mediastinal wires seen. IMPRESSION: 1. No convincing or large pneumothorax. 2. Low volume chest with atelectasis and edema. Evidence of interval right pleural effusion evacuation. 3. Lower aortic balloon pump, the tip projects 3 cm below the lower margin of the aortic arch. Other tubes and lines as described. Electronically Signed   By: Monte Fantasia M.D.   On: 08/28/2017 16:06   Dg Abd Portable 1v  Result Date: 08/29/2017 CLINICAL DATA:  Cortrak feeding tube placement. EXAM: PORTABLE ABDOMEN - 1 VIEW COMPARISON:  None. FINDINGS: Feeding tube enters the stomach, passes into the duodenum and has its tip at the ligament of Treitz. Nasogastric tube is present in the stomach. IMPRESSION: Feeding tube tip at the ligament of Treitz. Electronically Signed   By: Nelson Chimes M.D.   On: 08/29/2017 12:48     Cardiac Studies   Echo 08/23/17: Study Conclusions  - Left ventricle: The cavity size was normal. Wall thickness was   increased in a pattern of moderate LVH. Systolic function was   vigorous. The estimated ejection fraction was in the range of 65%   to 70%. Wall motion was normal; there were no regional wall   motion abnormalities. The study is not technically sufficient to   allow evaluation of LV diastolic function. - Aortic valve: Mildly calcified annulus. Trileaflet; normal   thickness leaflets. Valve area (VTI): 1.95 cm^2. Valve area   (Vmax): 1.73 cm^2. - Right ventricle: The cavity size was moderately dilated. The RV   shares the apex with the LV. Systolic function was moderately to   severely reduced. The RV free wall is hypokinetic. - Inferior vena cava: The vessel was dilated. The respirophasic   diameter changes were blunted (< 50%), consistent with elevated   central venous pressure. - Pericardium, extracardiac: There is a trivial circumferential   pericardial effusion Echodensity attached to  the RV apex looks   most consistent with fat pad as it is adherent to the muscle wall   and moves in coordination.  LHC 08/23/17:  Prox RCA to Mid RCA lesion, 100 %stenosed.  Ost LAD lesion, 90 %stenosed.  Mid LAD lesion, 75 %stenosed.  1st Diag lesion, 75 %stenosed.  Patient Profile     66 y.o. male with CAD s/p MI 1999 (PCI of RCA), PAD s/p L fem-pop bypass, hypertenison, hyperlipidemia, diabetes and ongoing tobacco abuse here with acute inferior infarct, cardiogenic shock, RV infarct and junctional rhythm now s/p CABG and RV centrimag placement.    Assessment & Plan    # Acute RV infarct s/p CABG:  Continue aspirin and atorvastatin.    # RV Failure: # Cardiogenic shock: He continues to require high doses of norepinephrine.  Also on epinephrine and milrinone.  Had some runs of NSVT overnight and this AM.  Unable to wean pressors or IABP at this time.  A line MAP  61.  CVP 14.  He is currently getting lasix 74m IV bid but was net +2L yesterday.  Given that he is dropping his MAP, would hold on more aggressive diuresis at this time.  Metoprolol held 2/2 cardiogenic shock. Tentatively scheduled for removal of Centrimag on 9/27.  Plan to try weaning IABP Monday.  # Hyperlipidemia: Continue atorvastatin.   # NSVT:  # Afib prophylaxis: Continue amiodarone.  # Tobacco abuse: Will need cessation counseling when able.   Time spent: 45 minutes-Greater than 50% of this time was spent in counseling, explanation of diagnosis, planning of further management, and coordination of care.    For questions or updates, please contact [email or web address] consult [email or web address] for contact info under Cardiology/STEMI.      Signed, TSkeet Latch MD  08/30/2017, 8:08 AM

## 2017-08-30 NOTE — Progress Notes (Addendum)
TCTS DAILY ICU PROGRESS NOTE                   [address]            [place] [address]          [phone]   2 Days Post-Op Procedure(s) (LRB): CORONARY ARTERY BYPASS GRAFTING (CABG) times four using left internal mammary artery and right greater saphenous leg vein using endoscope. (N/A) TRANSESOPHAGEAL ECHOCARDIOGRAM (TEE) (N/A) PLACEMENT OF CENTRIMAG VENTRICULAR ASSIST DEVICE (Right)  Total Length of Stay:  LOS: 7 days   Subjective: Remains critically ill on centrimag/IABP/ full ventilator support/multiple pressors  Objective: Vital signs in last 24 hours: Temp:  [96.6 F (35.9 C)-99.5 F (37.5 C)] 98.2 F (36.8 C) (09/22 1000) Pulse Rate:  [39-143] 44 (09/22 1000) Cardiac Rhythm: A-V Sequential paced (09/22 0800) Resp:  [11-20] 16 (09/22 1000) BP: (95-120)/(46-68) 120/61 (09/22 1000) SpO2:  [92 %-99 %] 97 % (09/22 1000) Arterial Line BP: (88-331)/(40-309) 119/51 (09/22 1000) FiO2 (%):  [60 %-70 %] 70 % (09/22 0800) Weight:  [216 lb 7.9 oz (98.2 kg)] 216 lb 7.9 oz (98.2 kg) (09/22 0700)  Filed Weights   08/29/17 0400 08/30/17 0600 08/30/17 0700  Weight: 207 lb 3.7 oz (94 kg) 216 lb 7.9 oz (98.2 kg) 216 lb 7.9 oz (98.2 kg)    Weight change: 9 lb 4.2 oz (4.2 kg)   Hemodynamic parameters for last 24 hours: PAP: (29-50)/(18-36) 39/22 CVP:  [10 mmHg-28 mmHg] 15 mmHg CO:  [2.5 L/min-3.6 L/min] 3.6 L/min CI:  [1.2 L/min/m2-1.7 L/min/m2] 1.7 L/min/m2  Intake/Output from previous day: 09/21 0701 - 09/22 0700 In: 6146.3 [I.V.:4568.5; NG/GT:1127.8; IV Piggyback:450] Out: 3875 [Urine:2100; Emesis/NG output:1275; Chest Tube:770]  Intake/Output this shift: Total I/O In: 303 [I.V.:263; NG/GT:40] Out: 385 [Urine:135; Emesis/NG output:100; Chest Tube:150]  Vent Mode: SIMV;PRVC FiO2 (%):  [60 %-70 %] 70 % Set Rate:  [12 bmp] 12 bmp Vt Set:  [650 mL] 650 mL PEEP:  [5 cmH20] 5 cmH20 Pressure Support:  [10 cmH20] 10 cmH20 Plateau Pressure:  [15 cmH20-31  cmH20] 31 cmH20   Current Meds: Scheduled Meds: . acetaminophen  1,000 mg Oral Q6H   Or  . acetaminophen (TYLENOL) oral liquid 160 mg/5 mL  1,000 mg Per Tube Q6H  . aspirin EC  325 mg Oral Daily   Or  . aspirin  324 mg Per Tube Daily  . atorvastatin  80 mg Oral q1800  . bisacodyl  10 mg Oral Daily   Or  . bisacodyl  10 mg Rectal Daily  . brimonidine  1 drop Right Eye BID  . chlorhexidine gluconate (MEDLINE KIT)  15 mL Mouth Rinse BID  . Chlorhexidine Gluconate Cloth  6 each Topical Q0600  . docusate  200 mg Per Tube Daily  . feeding supplement (PRO-STAT SUGAR FREE 64)  30 mL Per Tube TID  . furosemide  20 mg Intravenous BID  . insulin aspart  0-24 Units Subcutaneous Q4H  . insulin detemir  15 Units Subcutaneous Daily  . mouth rinse  15 mL Mouth Rinse 10 times per day  . metoCLOPramide (REGLAN) injection  10 mg Intravenous Q6H  . metoprolol tartrate  12.5 mg Oral BID   Or  . metoprolol tartrate  12.5 mg Per Tube BID  . mupirocin ointment  1 application Topical BID  . pantoprazole  40 mg Oral Daily  . sodium chloride flush  10-40 mL Intracatheter Q12H  . sodium chloride flush  3 mL Intravenous Q12H  . traZODone  100 mg Oral QHS   Continuous Infusions: . sodium chloride 20 mL/hr at 08/30/17 0800  . sodium chloride    . sodium chloride Stopped (08/29/17 0100)  . albumin human    . amiodarone 30 mg/hr (08/30/17 0800)  . cefUROXime (ZINACEF)  IV Stopped (08/30/17 0908)  . dexmedetomidine 1.2 mcg/kg/hr (08/30/17 0924)  . EPINEPHrine 4 mg in dextrose 5% 250 mL infusion (16 mcg/mL) 4 mcg/min (08/30/17 0800)  . feeding supplement (VITAL 1.5 CAL) 1,000 mL (08/30/17 0800)  . fentaNYL infusion INTRAVENOUS 300 mcg/hr (08/30/17 0820)  . heparin 1,500 Units/hr (08/30/17 0913)  . insulin (NOVOLIN-R) infusion 23.3 mL/hr at 08/30/17 1000  . lactated ringers 20 mL/hr at 08/30/17 0800  . lactated ringers    . milrinone 0.3 mcg/kg/min (08/30/17 0927)  . norepinephrine (LEVOPHED) Adult  infusion    . vancomycin Stopped (08/30/17 1025)   PRN Meds:.sodium chloride, ALPRAZolam, bisacodyl, fentaNYL, fentaNYL (SUBLIMAZE) injection, metoprolol tartrate, midazolam, ondansetron (ZOFRAN) IV, sodium chloride flush, sodium chloride flush  General appearance: sedated fully Neurologic: fully sedated Heart: regular rate and rhythm Lungs: coarse BS Abdomen: soft, nondistended Extremities: min edema Wound: dressings CDI  Lab Results: CBC: Recent Labs  08/29/17 1613 08/30/17 0254  WBC 13.4* 11.6*  HGB 10.1* 9.3*  HCT 30.7* 28.1*  PLT 156 129*   BMET:  Recent Labs  08/29/17 0228 08/29/17 1556 08/29/17 1613 08/30/17 0254  NA 133* 132*  --  126*  K 4.3 4.7  --  4.4  CL 98* 94*  --  95*  CO2 29  --   --  25  GLUCOSE 99 205*  --  300*  BUN 18 20  --  20  CREATININE 0.92 1.00 1.08 0.94  CALCIUM 7.6*  --   --  7.3*    CMET: Lab Results  Component Value Date   WBC 11.6 (H) 08/30/2017   HGB 9.3 (L) 08/30/2017   HCT 28.1 (L) 08/30/2017   PLT 129 (L) 08/30/2017   GLUCOSE 300 (H) 08/30/2017   CHOL 126 08/24/2017   TRIG 240 (H) 08/24/2017   HDL 31 (L) 08/24/2017   LDLDIRECT 128.6 10/30/2012   LDLCALC 47 08/24/2017   ALT 17 08/30/2017   AST 31 08/30/2017   NA 126 (L) 08/30/2017   K 4.4 08/30/2017   CL 95 (L) 08/30/2017   CREATININE 0.94 08/30/2017   BUN 20 08/30/2017   CO2 25 08/30/2017   TSH 0.722 08/25/2017   INR 1.61 08/30/2017   HGBA1C 5.9 (H) 08/25/2017   MICROALBUR 30.9 (H) 10/30/2012   ABG    Component Value Date/Time   PHART 7.297 (L) 08/30/2017 0303   PCO2ART 53.0 (H) 08/30/2017 0303   PO2ART 68.0 (L) 08/30/2017 0303   HCO3 26.1 08/30/2017 0303   TCO2 28 08/30/2017 0303   ACIDBASEDEF 1.0 08/30/2017 0303   O2SAT 66.1 08/30/2017 0310   PT/INR:  Recent Labs  08/30/17 0254  LABPROT 19.0*  INR 1.61   Radiology: Dg Chest Port 1 View  Result Date: 08/30/2017 CLINICAL DATA:  Status post coronary bypass grafting EXAM: PORTABLE CHEST 1 VIEW  COMPARISON:  08/29/2017 FINDINGS: Cardiac shadow remains enlarged. Changes consistent with coronary bypass grafting are noted. Swan-Ganz catheter is noted in the pulmonary outflow tract. Intra-aortic balloon pump is seen in the descending aorta below the aortic knob. Bilateral chest tubes are seen as well as mediastinal drain. Endotracheal tube and nasogastric catheter are noted in satisfactory position. Right ventricular assist  device cannulas are noted as well. The lungs are hypo aerated bilaterally with bibasilar atelectatic changes. No pneumothorax is seen. Right-sided PICC line is again noted. IMPRESSION: Tubes and lines as described above to include a right ventricular assist device. New bibasilar atelectatic changes likely related to the poor inspiratory effort. Electronically Signed   By: Inez Catalina M.D.   On: 08/30/2017 08:10   Dg Abd Portable 1v  Result Date: 08/29/2017 CLINICAL DATA:  Cortrak feeding tube placement. EXAM: PORTABLE ABDOMEN - 1 VIEW COMPARISON:  None. FINDINGS: Feeding tube enters the stomach, passes into the duodenum and has its tip at the ligament of Treitz. Nasogastric tube is present in the stomach. IMPRESSION: Feeding tube tip at the ligament of Treitz. Electronically Signed   By: Nelson Chimes M.D.   On: 08/29/2017 12:48     Assessment/Plan: S/P Procedure(s) (LRB): CORONARY ARTERY BYPASS GRAFTING (CABG) times four using left internal mammary artery and right greater saphenous leg vein using endoscope. (N/A) TRANSESOPHAGEAL ECHOCARDIOGRAM (TEE) (N/A) PLACEMENT OF CENTRIMAG VENTRICULAR ASSIST DEVICE (Right)  1 critically ill- CCM has been consulted, conts with balloon pump and centrimag, Co-Ox is 66 2 current gtts- amio/levo/epi/milrinone 3 av seq paced, slow junctional underneath 4 recent vent changes for resp acidosis with poor oxygenation, significant shunting wit A-a >300 ABG to be rechecked in hour. Sedation as per CCM 5 volume overload/ poor UO/  pseudo-hyponatremia/ normal creat- lasix gtt probably to be started 6 min leukocytosis without fevers, currently on vanc zinacef  7 blood sugars poorly controlled- adjust gtt as needed(insulin) 8 H/H pretty stable ABL anemia0 monitor closely on heparin for centrimag- leave CT's in place with significant output- serosang currently 9 NGT - brown , 850 yesterday, 675 so far today- monitor closely as could play a role if metabolic alkalosis developes. Change protonix to IV 10 post pyloric feedscurrently GOLD,WAYNE E 08/30/2017 10:21 AM    I have seen and examined the patient and agree with the assessment and plan as outlined.  Worsening oxygenation and increased opacity on CXR c/w CHF but cannot r/o HCAP.  Vent changed to PRVC to increase minute ventilation.  Will ask for Pulm/CCM consult.  Start lasix drip.  Keep sedated on vent.  Broaden antibiotic coverage to add Gram negative coverage.  Continue heparin and watch chest tube output.  Keep IABP and RVAD for now w/out any attempts to wean.  Rexene Alberts, MD 08/30/2017

## 2017-08-30 NOTE — Progress Notes (Signed)
Art line with appropriate wave form

## 2017-08-31 ENCOUNTER — Inpatient Hospital Stay (HOSPITAL_COMMUNITY): Payer: PPO

## 2017-08-31 DIAGNOSIS — J9690 Respiratory failure, unspecified, unspecified whether with hypoxia or hypercapnia: Secondary | ICD-10-CM

## 2017-08-31 DIAGNOSIS — I2510 Atherosclerotic heart disease of native coronary artery without angina pectoris: Secondary | ICD-10-CM

## 2017-08-31 LAB — GLUCOSE, CAPILLARY
GLUCOSE-CAPILLARY: 147 mg/dL — AB (ref 65–99)
GLUCOSE-CAPILLARY: 183 mg/dL — AB (ref 65–99)
GLUCOSE-CAPILLARY: 214 mg/dL — AB (ref 65–99)
GLUCOSE-CAPILLARY: 219 mg/dL — AB (ref 65–99)
GLUCOSE-CAPILLARY: 228 mg/dL — AB (ref 65–99)
GLUCOSE-CAPILLARY: 245 mg/dL — AB (ref 65–99)
GLUCOSE-CAPILLARY: 248 mg/dL — AB (ref 65–99)
GLUCOSE-CAPILLARY: 267 mg/dL — AB (ref 65–99)
GLUCOSE-CAPILLARY: 288 mg/dL — AB (ref 65–99)
GLUCOSE-CAPILLARY: 341 mg/dL — AB (ref 65–99)
GLUCOSE-CAPILLARY: 347 mg/dL — AB (ref 65–99)
GLUCOSE-CAPILLARY: 374 mg/dL — AB (ref 65–99)
GLUCOSE-CAPILLARY: 397 mg/dL — AB (ref 65–99)
Glucose-Capillary: 191 mg/dL — ABNORMAL HIGH (ref 65–99)
Glucose-Capillary: 229 mg/dL — ABNORMAL HIGH (ref 65–99)
Glucose-Capillary: 246 mg/dL — ABNORMAL HIGH (ref 65–99)
Glucose-Capillary: 273 mg/dL — ABNORMAL HIGH (ref 65–99)
Glucose-Capillary: 282 mg/dL — ABNORMAL HIGH (ref 65–99)
Glucose-Capillary: 291 mg/dL — ABNORMAL HIGH (ref 65–99)
Glucose-Capillary: 295 mg/dL — ABNORMAL HIGH (ref 65–99)
Glucose-Capillary: 312 mg/dL — ABNORMAL HIGH (ref 65–99)
Glucose-Capillary: 327 mg/dL — ABNORMAL HIGH (ref 65–99)
Glucose-Capillary: 350 mg/dL — ABNORMAL HIGH (ref 65–99)
Glucose-Capillary: 368 mg/dL — ABNORMAL HIGH (ref 65–99)
Glucose-Capillary: 402 mg/dL — ABNORMAL HIGH (ref 65–99)

## 2017-08-31 LAB — POCT ACTIVATED CLOTTING TIME
ACTIVATED CLOTTING TIME: 164 s
ACTIVATED CLOTTING TIME: 164 s
ACTIVATED CLOTTING TIME: 169 s
ACTIVATED CLOTTING TIME: 169 s
ACTIVATED CLOTTING TIME: 169 s
ACTIVATED CLOTTING TIME: 169 s
ACTIVATED CLOTTING TIME: 169 s
ACTIVATED CLOTTING TIME: 169 s
ACTIVATED CLOTTING TIME: 169 s
ACTIVATED CLOTTING TIME: 175 s
ACTIVATED CLOTTING TIME: 175 s
ACTIVATED CLOTTING TIME: 175 s
ACTIVATED CLOTTING TIME: 175 s
ACTIVATED CLOTTING TIME: 175 s
Activated Clotting Time: 175 seconds
Activated Clotting Time: 175 seconds
Activated Clotting Time: 169 seconds
Activated Clotting Time: 169 seconds
Activated Clotting Time: 175 seconds
Activated Clotting Time: 164 seconds
Activated Clotting Time: 169 seconds
Activated Clotting Time: 175 seconds
Activated Clotting Time: 180 seconds

## 2017-08-31 LAB — POCT I-STAT 3, ART BLOOD GAS (G3+)
ACID-BASE DEFICIT: 1 mmol/L (ref 0.0–2.0)
ACID-BASE DEFICIT: 5 mmol/L — AB (ref 0.0–2.0)
Acid-base deficit: 2 mmol/L (ref 0.0–2.0)
BICARBONATE: 21.2 mmol/L (ref 20.0–28.0)
Bicarbonate: 23.1 mmol/L (ref 20.0–28.0)
Bicarbonate: 23 mmol/L (ref 20.0–28.0)
O2 SAT: 96 %
O2 Saturation: 91 %
O2 Saturation: 89 %
PCO2 ART: 34.3 mmHg (ref 32.0–48.0)
PCO2 ART: 43.7 mmHg (ref 32.0–48.0)
PCO2 ART: 42.4 mmHg (ref 32.0–48.0)
PH ART: 7.345 — AB (ref 7.350–7.450)
PO2 ART: 73 mmHg — AB (ref 83.0–108.0)
PO2 ART: 66 mmHg — AB (ref 83.0–108.0)
Patient temperature: 36.3
Patient temperature: 36.7
Patient temperature: 37.5
TCO2: 24 mmol/L (ref 22–32)
TCO2: 23 mmol/L (ref 22–32)
TCO2: 24 mmol/L (ref 22–32)
pH, Arterial: 7.433 (ref 7.350–7.450)
pH, Arterial: 7.292 — ABNORMAL LOW (ref 7.350–7.450)
pO2, Arterial: 62 mmHg — ABNORMAL LOW (ref 83.0–108.0)

## 2017-08-31 LAB — BASIC METABOLIC PANEL
ANION GAP: 8 (ref 5–15)
BUN: 19 mg/dL (ref 6–20)
CHLORIDE: 91 mmol/L — AB (ref 101–111)
CO2: 23 mmol/L (ref 22–32)
CREATININE: 1.17 mg/dL (ref 0.61–1.24)
Calcium: 7.2 mg/dL — ABNORMAL LOW (ref 8.9–10.3)
GFR calc non Af Amer: >60 mL/min (ref >60)
Glucose, Bld: 340 mg/dL — ABNORMAL HIGH (ref 65–99)
POTASSIUM: 5.9 mmol/L — AB (ref 3.5–5.1)
SODIUM: 122 mmol/L — AB (ref 135–145)

## 2017-08-31 LAB — BPAM RBC
Blood Product Expiration Date: 201810092359
Blood Product Expiration Date: 201810122359
Blood Product Expiration Date: 201810162359
ISSUE DATE / TIME: 201809201157
ISSUE DATE / TIME: 201809201923
Unit Type and Rh: 5100
Unit Type and Rh: 5100
Unit Type and Rh: 5100

## 2017-08-31 LAB — APTT: aPTT: 127 seconds — ABNORMAL HIGH (ref 24–36)

## 2017-08-31 LAB — COMPREHENSIVE METABOLIC PANEL
ALT: 15 U/L — ABNORMAL LOW (ref 17–63)
AST: 39 U/L (ref 15–41)
Albumin: 2.1 g/dL — ABNORMAL LOW (ref 3.5–5.0)
Alkaline Phosphatase: 65 U/L (ref 38–126)
Anion gap: 7 (ref 5–15)
BUN: 18 mg/dL (ref 6–20)
CO2: 25 mmol/L (ref 22–32)
Calcium: 7.3 mg/dL — ABNORMAL LOW (ref 8.9–10.3)
Chloride: 93 mmol/L — ABNORMAL LOW (ref 101–111)
Creatinine, Ser: 1.04 mg/dL (ref 0.61–1.24)
GFR calc Af Amer: >60 mL/min (ref >60)
GFR calc non Af Amer: >60 mL/min (ref >60)
Glucose, Bld: 221 mg/dL — ABNORMAL HIGH (ref 65–99)
Potassium: 4.1 mmol/L (ref 3.5–5.1)
Sodium: 125 mmol/L — ABNORMAL LOW (ref 135–145)
Total Bilirubin: 0.7 mg/dL (ref 0.3–1.2)
Total Protein: 5.1 g/dL — ABNORMAL LOW (ref 6.5–8.1)

## 2017-08-31 LAB — COOXEMETRY PANEL
Carboxyhemoglobin: 0.7 % (ref 0.5–1.5)
Methemoglobin: 1.6 % — ABNORMAL HIGH (ref 0.0–1.5)
O2 Saturation: 67.9 %
Total hemoglobin: 8.9 g/dL — ABNORMAL LOW (ref 12.0–16.0)

## 2017-08-31 LAB — TYPE AND SCREEN
ABO/RH(D): O POS
Antibody Screen: NEGATIVE
Unit division: 0
Unit division: 0
Unit division: 0

## 2017-08-31 LAB — POCT I-STAT, CHEM 8
BUN: 20 mg/dL (ref 6–20)
Calcium, Ion: 1.06 mmol/L — ABNORMAL LOW (ref 1.15–1.40)
Chloride: 86 mmol/L — ABNORMAL LOW (ref 101–111)
Creatinine, Ser: 1 mg/dL (ref 0.61–1.24)
Glucose, Bld: 325 mg/dL — ABNORMAL HIGH (ref 65–99)
HEMATOCRIT: 30 % — AB (ref 39.0–52.0)
HEMOGLOBIN: 10.2 g/dL — AB (ref 13.0–17.0)
Potassium: 6 mmol/L — ABNORMAL HIGH (ref 3.5–5.1)
Sodium: 123 mmol/L — ABNORMAL LOW (ref 135–145)
TCO2: 22 mmol/L (ref 22–32)

## 2017-08-31 LAB — HEPARIN LEVEL (UNFRACTIONATED): Heparin Unfractionated: 0.16 [IU]/mL — ABNORMAL LOW (ref 0.30–0.70)

## 2017-08-31 LAB — CBC
HCT: 26.5 % — ABNORMAL LOW (ref 39.0–52.0)
Hemoglobin: 8.9 g/dL — ABNORMAL LOW (ref 13.0–17.0)
MCH: 28.9 pg (ref 26.0–34.0)
MCHC: 33.6 g/dL (ref 30.0–36.0)
MCV: 86 fL (ref 78.0–100.0)
PLATELETS: 120 10*3/uL — AB (ref 150–400)
RBC: 3.08 MIL/uL — AB (ref 4.22–5.81)
RDW: 13.7 % (ref 11.5–15.5)
WBC: 12.7 10*3/uL — AB (ref 4.0–10.5)

## 2017-08-31 LAB — PROTIME-INR
INR: 1.5
Prothrombin Time: 18 seconds — ABNORMAL HIGH (ref 11.4–15.2)

## 2017-08-31 LAB — LACTATE DEHYDROGENASE: LDH: 256 U/L — ABNORMAL HIGH (ref 98–192)

## 2017-08-31 LAB — VANCOMYCIN, TROUGH: Vancomycin Tr: 16 ug/mL (ref 15–20)

## 2017-08-31 MED ORDER — MILRINONE LACTATE IN DEXTROSE 20-5 MG/100ML-% IV SOLN
0.1250 ug/kg/min | INTRAVENOUS | Status: DC
  Administered 2017-09-01 – 2017-09-11 (×17): 0.2 ug/kg/min via INTRAVENOUS
  Administered 2017-09-12: 0.1 ug/kg/min via INTRAVENOUS
  Administered 2017-09-13 – 2017-09-14 (×2): 0.125 ug/kg/min via INTRAVENOUS
  Filled 2017-08-31 (×19): qty 100

## 2017-08-31 MED ORDER — MIDAZOLAM BOLUS VIA INFUSION
1.0000 mg | INTRAVENOUS | Status: DC | PRN
  Administered 2017-08-31 – 2017-09-02 (×2): 2 mg via INTRAVENOUS
  Administered 2017-09-05 (×2): 1 mg via INTRAVENOUS
  Administered 2017-09-06: 4 mg via INTRAVENOUS
  Filled 2017-08-31: qty 2

## 2017-08-31 MED ORDER — AMIODARONE HCL IN DEXTROSE 360-4.14 MG/200ML-% IV SOLN
60.0000 mg/h | INTRAVENOUS | Status: DC
  Administered 2017-08-31: 60 mg/h via INTRAVENOUS
  Filled 2017-08-31 (×2): qty 200

## 2017-08-31 MED ORDER — INSULIN DETEMIR 100 UNIT/ML ~~LOC~~ SOLN
20.0000 [IU] | Freq: Every day | SUBCUTANEOUS | Status: DC
  Administered 2017-08-31: 20 [IU] via SUBCUTANEOUS
  Filled 2017-08-31: qty 0.2

## 2017-08-31 MED ORDER — VECURONIUM BROMIDE 10 MG IV SOLR
INTRAVENOUS | Status: AC
  Administered 2017-08-31: 50 mg
  Filled 2017-08-31: qty 10

## 2017-08-31 MED ORDER — AMIODARONE LOAD VIA INFUSION
150.0000 mg | Freq: Once | INTRAVENOUS | Status: AC
  Administered 2017-08-31: 150 mg via INTRAVENOUS

## 2017-08-31 MED ORDER — ACETAMINOPHEN 160 MG/5ML PO SOLN
650.0000 mg | Freq: Four times a day (QID) | ORAL | Status: DC | PRN
  Administered 2017-08-31 – 2017-09-07 (×5): 650 mg
  Filled 2017-08-31 (×5): qty 20.3

## 2017-08-31 MED ORDER — VASOPRESSIN 20 UNIT/ML IV SOLN
0.0300 [IU]/min | INTRAVENOUS | Status: DC
  Administered 2017-08-31 – 2017-09-02 (×3): 0.03 [IU]/min via INTRAVENOUS
  Filled 2017-08-31 (×3): qty 2

## 2017-08-31 MED ORDER — ALBUMIN HUMAN 5 % IV SOLN
12.5000 g | Freq: Once | INTRAVENOUS | Status: AC
  Administered 2017-08-31: 12.5 g via INTRAVENOUS

## 2017-08-31 MED ORDER — METOLAZONE 5 MG PO TABS
10.0000 mg | ORAL_TABLET | Freq: Two times a day (BID) | ORAL | Status: DC
  Administered 2017-08-31 – 2017-09-01 (×2): 10 mg via ORAL
  Filled 2017-08-31 (×2): qty 2

## 2017-08-31 MED ORDER — HYDROCORTISONE NA SUCCINATE PF 100 MG IJ SOLR
50.0000 mg | Freq: Four times a day (QID) | INTRAMUSCULAR | Status: DC
  Administered 2017-08-31 – 2017-09-02 (×9): 50 mg via INTRAVENOUS
  Filled 2017-08-31 (×9): qty 2

## 2017-08-31 MED ORDER — AMIODARONE HCL IN DEXTROSE 360-4.14 MG/200ML-% IV SOLN
30.0000 mg/h | INTRAVENOUS | Status: DC
  Administered 2017-08-31 – 2017-09-01 (×4): 30 mg/h via INTRAVENOUS
  Filled 2017-08-31 (×5): qty 200

## 2017-08-31 MED ORDER — SODIUM CHLORIDE 0.9 % IV SOLN
0.0000 mg/h | INTRAVENOUS | Status: DC
  Administered 2017-08-31 – 2017-09-01 (×3): 2 mg/h via INTRAVENOUS
  Administered 2017-09-01 – 2017-09-02 (×3): 4 mg/h via INTRAVENOUS
  Administered 2017-09-02: 6 mg/h via INTRAVENOUS
  Administered 2017-09-03: 8 mg/h via INTRAVENOUS
  Administered 2017-09-03 – 2017-09-04 (×2): 4 mg/h via INTRAVENOUS
  Administered 2017-09-05: 5 mg/h via INTRAVENOUS
  Administered 2017-09-05 (×2): 4 mg/h via INTRAVENOUS
  Administered 2017-09-06 – 2017-09-07 (×4): 6 mg/h via INTRAVENOUS
  Administered 2017-09-07 (×2): 7 mg/h via INTRAVENOUS
  Administered 2017-09-08: 8 mg/h via INTRAVENOUS
  Administered 2017-09-08: 9 mg/h via INTRAVENOUS
  Filled 2017-08-31 (×23): qty 10

## 2017-08-31 MED ORDER — NOREPINEPHRINE-SODIUM CHLORIDE 16-0.9 MG/250ML-% IV SOLN
16.0000 mg | INTRAVENOUS | Status: DC
  Administered 2017-08-31: 16 mg via INTRAVENOUS
  Filled 2017-08-31 (×2): qty 1

## 2017-08-31 MED ORDER — SODIUM POLYSTYRENE SULFONATE 15 GM/60ML PO SUSP
30.0000 g | Freq: Once | ORAL | Status: AC
  Administered 2017-08-31: 30 g
  Filled 2017-08-31: qty 120

## 2017-08-31 MED ORDER — ALBUMIN HUMAN 5 % IV SOLN
INTRAVENOUS | Status: AC
Start: 2017-08-31 — End: 2017-08-31
  Filled 2017-08-31: qty 250

## 2017-08-31 MED ORDER — INSULIN DETEMIR 100 UNIT/ML ~~LOC~~ SOLN
30.0000 [IU] | Freq: Two times a day (BID) | SUBCUTANEOUS | Status: DC
  Administered 2017-08-31 – 2017-09-02 (×4): 30 [IU] via SUBCUTANEOUS
  Filled 2017-08-31 (×5): qty 0.3

## 2017-08-31 NOTE — Progress Notes (Signed)
TCTS BRIEF SICU PROGRESS NOTE  3 Days Post-Op  S/P Procedure(s) (LRB): CORONARY ARTERY BYPASS GRAFTING (CABG) times four using left internal mammary artery and right greater saphenous leg vein using endoscope. (N/A) TRANSESOPHAGEAL ECHOCARDIOGRAM (TEE) (N/A) PLACEMENT OF CENTRIMAG VENTRICULAR ASSIST DEVICE (Right)   Remains in stable sinus/paced rhythm Reasonably stable hemodynamics, levophed drip down from 40 to 30 mcg/min RVAD flows stable O2 sats 95% on 80% FiO2 UOP > 100 mL/hr Serum potassium 5.9-6.0, glucose > 300 on insulin drip Now on IV steroids  Plan: Add kaexylate down OG tube.  Continue current plan.  Purcell Nails, MD 08/31/2017 5:58 PM

## 2017-08-31 NOTE — Progress Notes (Signed)
ANTICOAGULATION CONSULT NOTE - Follow Up Consult  Pharmacy Consult for heparin Indication: IABP/Centrimag  Allergies  Allergen Reactions  . Aspirin Other (See Comments)    Stomach upset  . Latex Rash  . Oxycodone-Acetaminophen Itching    Patient Measurements: Height:  (180.3 cm) Weight: 219 lb 5.7 oz (99.5 kg) IBW/kg (Calculated) : 75.3 Heparin Dosing Weight: 90 kg  Vital Signs: Temp: 97.2 F (36.2 C) (09/23 0700) Temp Source: Core (Comment) (09/23 0700) BP: 89/72 (09/23 0700) Pulse Rate: 29 (09/23 0700)  Labs:  Recent Labs  08/28/17 1515  08/29/17 0228  08/29/17 1613 08/30/17 0254 08/30/17 1600 08/30/17 2000 08/30/17 2209 08/31/17 0255  HGB 9.4*  < > 10.3*  < > 10.1* 9.3* 8.5*  --  8.8* 8.9*  HCT 28.2*  < > 31.3*  < > 30.7* 28.1* 25.0*  --  26.0* 26.5*  PLT 166  168  < > 150  --  156 129*  --   --   --  120*  APTT 35  --  34  --   --   --   --   --   --  127*  LABPROT 16.9*  --  15.8*  --   --  19.0*  --   --   --  18.0*  INR 1.39  --  1.27  --   --  1.61  --   --   --  1.50  HEPARINUNFRC  --   --  <0.10*  --   --  0.25*  --   --   --  0.16*  CREATININE  --   < > 0.92  < > 1.08 0.94 1.00 1.03 1.00 1.04  < > = values in this interval not displayed.  Estimated Creatinine Clearance: 87.4 mL/min (by C-G formula based on SCr of 1.04 mg/dL).     Assessment: 75 yoM presented as code STEMI now s/p cath with IABP placed an patient underwent CABG x4 9/20 requiring Centrimag placement for RV support and continued IABP for LV support.  Resumed heparin drip for Centrimag protocol. VAD flow rate currently 4-4.5, targeting ACT of (155) 160-180 keep low end of goal.  Heparin drip 1500 uts/hr ACT 160s.  CBC stable but RN noticed slightly more CT output 9/22 but now stablet  -  discuss AC goal with MD- keep heparin drip < 1800 uts/hr and ACTs 160ish lower end of goal  Goal of Therapy:  ACT 160-180 seconds Monitor platelets by anticoagulation protocol: Yes   Plan:   Continue heparin drip 1500 uts/hr -Follow ACT per nursing protocol -Monitor CBC, S/Sx bleeding  Leota Sauers Pharm.D. CPP, BCPS Clinical Pharmacist [phone] 08/31/2017 7:26 AM

## 2017-08-31 NOTE — Procedures (Signed)
Procedure Name: Intubation Date/Time: 08/31/2017 8:05 PM Performed by: Caro Laroche Pre-anesthesia Checklist: Patient identified, Emergency Drugs available, Suction available, Timeout performed and Patient being monitored Preoxygenation: Pre-oxygenation with 100% oxygen Induction Type: IV induction Laryngoscope Size: Glidescope and 4 Grade View: Grade I Tube size: 7.5 mm Number of attempts: 1 Airway Equipment and Method: Bougie stylet and Video-laryngoscopy Placement Confirmation: ETT inserted through vocal cords under direct vision,  Breath sounds checked- equal and bilateral and CO2 detector Secured at: 25 cm Tube secured with: ETT holder Comments: There were persistent loss of return volumes despite refilling cuff and full pilot balloon with previsou 8.5 ett.  Under visualization glidescope 4, ett was deflated, advanced and reinflated and continued volume loss ~167ml.  Under visualization ett exchanged over bougie w/o difficulty for 7.5 ett.  CXR shows good ett placement.

## 2017-08-31 NOTE — Progress Notes (Signed)
Pharmacy Antibiotic Note  Clinton Harrington is a 66 y.o. male admitted on 08/23/2017 with infection prophylaxis post-centrimag placement.  Pharmacy has been consulted for vancomycin and ceftazidime dosing.  WBC slightly elevated 12, afebrile Cr stable 1.   VT 16 at goal 15-20   Plan: Continue Vancomycin 1000 mg IV Q12 hours Monitor for s/sx of infection, LOT, and check troughs as appropriate.   Ceftazidime 1gm UV q8h  Height:  (180.3 cm) Weight: 219 lb 5.7 oz (99.5 kg) IBW/kg (Calculated) : 75.3  Temp (24hrs), Avg:98.6 F (37 C), Min:97 F (36.1 C), Max:100 F (37.8 C)   Recent Labs Lab 08/28/17 1940  08/29/17 0228  08/29/17 1613 08/30/17 0254 08/30/17 1600 08/30/17 2000 08/30/17 2209 08/31/17 0255 08/31/17 0627  WBC 12.9*  --  10.3  --  13.4* 11.6*  --   --   --  12.7*  --   CREATININE  --   < > 0.92  < > 1.08 0.94 1.00 1.03 1.00 1.04  --   VANCOTROUGH  --   --   --   --   --   --   --   --   --   --  16  < > = values in this interval not displayed.  Estimated Creatinine Clearance: 87.4 mL/min (by C-G formula based on SCr of 1.04 mg/dL).    Allergies  Allergen Reactions  . Aspirin Other (See Comments)    Stomach upset  . Latex Rash  . Oxycodone-Acetaminophen Itching   [place] 9/22 TA IP 9/20 TA norm flora 9/16 MRSA screen + staph aureus - MRSA   Leota Sauers Pharm.D. CPP, BCPS Clinical Pharmacist [phone] 08/31/2017 8:34 AM

## 2017-08-31 NOTE — Progress Notes (Signed)
1910- CCM rounding MD at bedside to exchange ETT due to leak.  Exchange complete without complication.  Breath sounds auscultated bilaterally.  CXR complete.  ETT placement confirmed at 2020 per Dr. Sherene Sires.

## 2017-08-31 NOTE — Progress Notes (Signed)
[address]       [place] [address]             [phone]        CARDIOTHORACIC SURGERY PROGRESS NOTE   R3 Days Post-Op Procedure(s) (LRB): CORONARY ARTERY BYPASS GRAFTING (CABG) times four using left internal mammary artery and right greater saphenous leg vein using endoscope. (N/A) TRANSESOPHAGEAL ECHOCARDIOGRAM (TEE) (N/A) PLACEMENT OF CENTRIMAG VENTRICULAR ASSIST DEVICE (Right)  Subjective: Sedated on vent  Objective: Vital signs: BP Readings from Last 1 Encounters:  08/31/17 108/61   Pulse Readings from Last 1 Encounters:  08/31/17 88   Resp Readings from Last 1 Encounters:  08/31/17 (!) 25   Temp Readings from Last 1 Encounters:  08/31/17 98.2 F (36.8 C)    Hemodynamics: PAP: (30-43)/(19-30) 40/25 CVP:  [12 mmHg-25 mmHg] 16 mmHg CO:  [2.9 L/min-3.2 L/min] 3.2 L/min CI:  [1.3 L/min/m2-1.5 L/min/m2] 1.5 L/min/m2  Mixed venous co-ox 68% RVAD flows 4.5-4.8 L/min  Physical Exam:  Rhythm:   paced  Breath sounds: Coarse but symmentrical  Heart sounds:  RRR  Incisions:  Dressing dry, intact  Abdomen:  Soft, non-distended, quiet  Extremities:  Warm, well-perfused  Chest tubes:  low volume thin serosanguinous output, no air leak    Intake/Output from previous day: 09/22 0701 - 09/23 0700 In: 7503.1 [I.V.:5243.1; NG/GT:1510; IV Piggyback:750] Out: 5570 [Urine:4360; Emesis/NG output:850; Chest Tube:360] Intake/Output this shift: Total I/O In: 1314.9 [I.V.:654.9; NG/GT:210; IV Piggyback:450] Out: 550 [Urine:360; Emesis/NG output:100; Chest Tube:90]  Lab Results:  CBC: Recent Labs  08/30/17 0254  08/30/17 2209 08/31/17 0255  WBC 11.6*  --   --  12.7*  HGB 9.3*  < > 8.8* 8.9*  HCT 28.1*  < > 26.0* 26.5*  PLT 129*  --   --  120*  < > = values in this interval not displayed.  BMET:  Recent Labs  08/30/17 2000 08/30/17 2209 08/31/17 0255  NA 128* 128* 125*  K 3.7 3.7 4.1  CL 96* 91* 93*  CO2 24  --  25  GLUCOSE 156*  178* 221*  BUN CREATININE 1.03 1.00 1.04  CALCIUM 7.3*  --  7.3*     PT/INR:   Recent Labs  08/31/17 0255  LABPROT 18.0*  INR 1.50    CBG (last 3)   Recent Labs  08/31/17 0758 08/31/17 0900 08/31/17 0955  GLUCAP 245* 267* 288*    ABG    Component Value Date/Time   PHART 7.292 (L) 08/31/2017 0938   PCO2ART 43.7 08/31/2017 0938   PO2ART 66.0 (L) 08/31/2017 0938   HCO3 21.2 08/31/2017 0938   TCO2 23 08/31/2017 0938   ACIDBASEDEF 5.0 (H) 08/31/2017 0938   O2SAT 91.0 08/31/2017 0938    CXR: PORTABLE CHEST 1 VIEW  COMPARISON:  August 30, 2017  FINDINGS: The ETT is in good position. The NG tube in the feeding tube terminates below today's film. Chest tubes in are stable. No pneumothorax. Increasing focal infiltrate in the right base. Increasing opacity in the retrocardiac region. Suspected pulmonary venous congestion. Stable right PICC line.  IMPRESSION: 1. Support apparatus as above. 2. Increasing infiltrate in the right base. Increasing opacity in left retrocardiac region. Recommend follow-up to resolution.   Electronically Signed   By: Gerome Sam III M.D   On: 08/31/2017 08:01  Assessment/Plan: S/P Procedure(s) (LRB): CORONARY ARTERY BYPASS GRAFTING (CABG) times four using left internal mammary artery and right greater saphenous leg  vein using endoscope. (N/A) TRANSESOPHAGEAL ECHOCARDIOGRAM (TEE) (N/A) PLACEMENT OF CENTRIMAG VENTRICULAR ASSIST DEVICE (Right)  Overall reasonably stable but tenuous In and out of Afib overnight - BP improved when in stable AV paced rhythm Stable hemodynamics and RVAD flows w/ co-ox 68% but increasing levophed and Epi dose for BP support, CVP 16 Oxygenation improved w/ stable O2 sats on 70% FiO2 but CXR reveals worsening diffuse opacity c/w CHF Renal function stable and diuresing well on lasix drip but I/O's still positive yesterday, massive fluid overload Expected post op acute blood loss anemia, Hgb  down slightly 8.9 Post op thrombocytopenia, stable ACT's stable on IV heparin Leukocytosis w/out fevers, mild, stable, on Vanc + Fortaz for possible HCAP Tolerating low volume tube feeds but no BM since surgery Hyperglycemia getting worse, now on IV steroids   Hold lasix temporarily and watch BP, hope to resume soon, titrate levophed and add vasopressin  Decrease milrinone  Watch anemia, thrombocytopenia  Increase levemir and continue insulin drip  Watch renal function  Continue amiodarone  Purcell Nails, MD 08/31/2017 10:22 AM

## 2017-08-31 NOTE — Progress Notes (Signed)
[place] drainage system changed using aseptic technique.

## 2017-08-31 NOTE — Progress Notes (Signed)
Pt rhythm continues to convert back and forth from afib to junctional/paced rhythm. Dr. Cornelius Moras notified, received orders for additional 150 mg amiodarone bolus and to increase amiodarone rate to 60 mg/hr. Pacer set to VVI backup rate of 70. Hemodynamics currently stable. Will continue to monitor.  Herma Ard, RN

## 2017-08-31 NOTE — Progress Notes (Signed)
Tube exchanged at this time with 7.5 ETT without complications. End tidal had positive color change with direct view of vocal cords using glidescope to exchange ETT. No cuff leak at this time. RT to continue to monitor as needed.

## 2017-08-31 NOTE — Progress Notes (Signed)
Dr Cornelius Moras paged with chem 8 and BMET potassium level of 5.9-6.0, calcium 7.2, creat 1.17. No new orders given at this time.

## 2017-08-31 NOTE — Progress Notes (Signed)
Vent changes made per NP order. ABG to follow-up.

## 2017-08-31 NOTE — Progress Notes (Signed)
PULMONARY / CRITICAL CARE MEDICINE   Name: Clinton Harrington MRN: [id number] DOB: December 14, 1950    ADMISSION DATE:  08/23/2017 CONSULTATION DATE:  08/30/17  REFERRING MD: Dr Cornelius Moras  CHIEF COMPLAINT:  Acute resp failure, hypoxia, hypercapnia  HISTORY OF PRESENT ILLNESS:  Patient is a 66 y/o Caucasian M with hx CADs/p BMS to RCA in 1999 (due to an inferior STEMI), significant PVD, HTN, Type 2 DM (non-insulin dependent), HLP, Tobacco use comes in today complaining of substernal chest pain on admission.He continued to have active CP and the patient was taken to cath lab for further intervention. FOund with RV infacrct and required emergent bypass surgery. Remained in cardiogenic shock requiring balloon pump and placement of RVAD device. Remained on vent with acute resp failure, skin closed but sternum is not closed.He has developed worsening acidosis and hypoxia with bilateral int infiltrates. He has been pos 4 liters last 2 4 hours. He remains on levophed and milrinone. Asked to assist in critical care.   SUBJECTIVE:  On epi/levo/IABP/RVAD/vent.  CI 1.5 despite all interventions  VITAL SIGNS: BP 107/62 (BP Location: Left Arm)   Pulse (!) 49   Temp (!) 97.3 F (36.3 C) (Core (Comment))   Resp (!) 26   Ht  (1.803 m)   Wt 219 lb 5.7 oz (99.5 kg)   SpO2 98%   BMI 30.59 kg/m   HEMODYNAMICS: PAP: (32-43)/(18-30) 35/24 CVP:  [10 mmHg-25 mmHg] 14 mmHg CO:  [2.9 L/min-3.6 L/min] 2.9 L/min CI:  [1.3 L/min/m2-1.7 L/min/m2] 1.3 L/min/m2  VENTILATOR SETTINGS: Vent Mode: PRVC FiO2 (%):  [70 %-80 %] 70 % Set Rate:  [20 bmp-26 bmp] 26 bmp Vt Set:  [620 mL] 620 mL PEEP:  [8 cmH20] 8 cmH20 Plateau Pressure:  [28 cmH20-29 cmH20] 29 cmH20  INTAKE / OUTPUT: I/O last 3 completed shifts: In: 65784 [I.V.:7750; NG/GT:2280; IV Piggyback:950] Out: 7365 [Urine:5130; Emesis/NG output:1425; Chest Tube:810]  PHYSICAL EXAMINATION: General:  Heavily sedated male on vent, IABP, RVAD HEENT: ET ->  vent ONG:EXBMWUX, agitated when not sedated Neuro: Follows commands CV: HSR IABP noted alone with rvad turbulence , CI 1.5 PULM: even/non-labored, lungs bilaterally diminished ct with scant drainage LK:GMWN, non-tender, bs decreased Extremities: warm/dry, +edema  Skin: no rashes or lesions   LABS:  BMET  Recent Labs Lab 08/30/17 0254  08/30/17 2000 08/30/17 2209 08/31/17 0255  NA 126*  < > 128* 128* 125*  K 4.4  < > 3.7 3.7 4.1  CL 95*  < > 96* 91* 93*  CO2 25  --  24  --  25  BUN 20  < > CREATININE 0.94  < > 1.03 1.00 1.04  GLUCOSE 300*  < > 156* 178* 221*  < > = values in this interval not displayed.  Electrolytes  Recent Labs Lab 08/29/17 0228 08/29/17 1613 08/30/17 0254 08/30/17 2000 08/31/17 0255  CALCIUM 7.6*  --  7.3* 7.3* 7.3*  MG 2.5* 1.9  --  1.6*  --   PHOS  --   --   --  2.7  --     CBC  Recent Labs Lab 08/29/17 1613 08/30/17 0254 08/30/17 1600 08/30/17 2209 08/31/17 0255  WBC 13.4* 11.6*  --   --  12.7*  HGB 10.1* 9.3* 8.5* 8.8* 8.9*  HCT 30.7* 28.1* 25.0* 26.0* 26.5*  PLT 156 129*  --   --  120*    Coag's  Recent Labs Lab 08/28/17 1515 08/29/17 0228 08/30/17 0254 08/31/17 0255  APTT 35 34  --  127*  INR 1.39 1.27 1.61 1.50    Sepsis Markers No results for input(s): LATICACIDVEN, PROCALCITON, O2SATVEN in the last 168 hours.  ABG  Recent Labs Lab 08/30/17 1559 08/30/17 2204 08/31/17 0304  PHART 7.378 7.388 7.433  PCO2ART 39.6 37.5 34.3  PO2ART 69.0* 70.0* 73.0*    Liver Enzymes  Recent Labs Lab 08/28/17 0350 08/30/17 0254 08/31/17 0255  AST 27 31 39  ALT 25 17 15*  ALKPHOS 59 58 65  BILITOT 0.6 0.7 0.7  ALBUMIN 3.1* 2.4* 2.1*    Cardiac Enzymes No results for input(s): TROPONINI, PROBNP in the last 168 hours.  Glucose  Recent Labs Lab 08/31/17 0151 08/31/17 0300 08/31/17 0402 08/31/17 0455 08/31/17 0556 08/31/17 0638  GLUCAP 214* 228* 246* 248* 229* 273*    Imaging No results  found.   STUDIES:  cath  CULTURES: 9/22 sputum>>>  ANTIBIOTICS: zinacef >>>9/22 ceftaz 9/22>>> Vanc>>>  SIGNIFICANT EVENTS: Cath cabg RV assist device  LINES/TUBES: ETT 9/22>>>  ASSESSMENT / PLAN:  PULMONARY A: Acute resp failure, PULM edema,at risk ALI Open chest sternum R/o PNA P:   pcxr c/w pulm edema with some slight patchy infiltrates I do not believe this is ALI / ARDS We need neg balance  Given rv failure would limit peep to 10, now 8 Decreased to 7 cc/kg as we were approaching plt pressures of 30, rate increased to 28 and abg in one hour.   CARDIOVASCULAR A:  Rv failure, cardiogenic shock, s/p CABG from inf MI P:  Milrinone per cvts Levophed/Epi to MAP goals Added Solu cortef 9/23 Balloon pump assist per cards RVADS per cards/cvts amio  RENAL Lab Results  Component Value Date   CREATININE 1.04 08/31/2017   CREATININE 1.00 08/30/2017   CREATININE 1.03 08/30/2017    Recent Labs Lab 08/30/17 2000 08/30/17 2209 08/31/17 0255  K 3.7 3.7 4.1     A:   Pulm edema  Intake/Output Summary (Last 24 hours) at 08/31/17 0811 Last data filed at 08/31/17 0759  Gross per 24 hour  Intake           7211.4 ml  Output             5630 ml  Net           1581.4 ml   P:   lasix infusion Pharmacy needs to concentrate input as able, at over 200 cc per hour now despite concentrations  Goal neg I/o On lasix drip will assess lytes q12h  GASTROINTESTINAL A:   Nutritio needs P:   per CVTS    HEMATOLOGIC A:   VAD P:  Anticoagulation per cvts  INFECTIOUS A:   R/o PNA P:   ceftaz  vanc Sputum sent  ENDOCRINE CBG (last 3)   Recent Labs  08/31/17 0455 08/31/17 0556 08/31/17 0638  GLUCAP 248* 229* 273*     A:   Hyperglycemia,  Relative adrenal insuff with cortisol 9.6 P:   cbg ssi  solucortef added will make glucose worse, insulin increased and on Drip.  NEUROLOGIC A:   Vent dyschrony Agitated when not sedated, follows  commands  P:   RASS goal: -2 precedex fent wua daily   FAMILY  - Updates: no family at bedside 9/23  - Inter-disciplinary family meet or Palliative Care meeting due by:   7 days  Ccm time 45 min  Clinton Harrington Minor ACNP Clinton Harrington PCCM Pager [phone] till 3 pm If no answer page [phone]  08/31/2017, 8:03 AM   STAFF NOTE: I, Clinton Percy, MD FACP have personally reviewed patient's available data, including medical history, events of note, physical examination and test results as part of my evaluation. I have discussed with resident/NP and other care providers such as pharmacist, RN and RRT. In addition, I personally evaluated patient and elicited key findings of: sedated, rass -3, jvd increased, crackles increased, edema increased, rvad , pressors increased, remained pos 2 liter slast 24 hours despite a reasonable urine output, higher intake from drips, pcxr I reviewed shows worsening alveolar edema , ett wnl, with worsening shock, agree we should add vaso , hope for levo requirements to reduce, epi to remain, balloon pump in place, wonder if he wil need more left heart support, need neg balance with vent needs, lasix held and would restart at 15 mg/hr onece BP rebounds after albumin, I do not see ALI, would re increase TV back to 8 cc/kg, see ph this am on 7, peep may nee to go to 10 but would be reluctant for now with current BP, we are successful with 70% o 2 needs, cvts to assess RV output, pcxr in am , bmet lytes again in pm on lasix drip, maintain current abx regimen, given worsening shock  Would abort precedex, maintain fent versed as drips, re evaluate LV fxn?, no family in room The patient is critically ill with multiple organ systems failure and requires high complexity decision making for assessment and support, frequent evaluation and titration of therapies, application of advanced monitoring technologies and extensive interpretation of multiple databases.   Critical Care Time devoted  to patient care services described in this note is 35 Minutes. This time reflects time of care of this signee: Clinton Percy, MD FACP. This critical care time does not reflect procedure time, or teaching time or supervisory time of PA/NP/Med student/Med Resident etc but could involve care discussion time. Rest per NP/medical resident whose note is outlined above and that I agree with   Clinton Harrington. Clinton Alias, MD, FACP Pgr: [phone] [facility] 08/31/2017 11:01 AM

## 2017-08-31 NOTE — Progress Notes (Signed)
  Amiodarone Drug - Drug Interaction Consult Note  Recommendations:  -Monitor QTc when amiodarone used in combination with other QT prolonging medications, such as ondansetron  -Monitor for hypokalemia secondary to furosemide use   Other:  -If PTA carvedilol is resumed, monitor for bradycardia  -If PTA simvastatin is resumed, keep simvastatin dose 20mg  daily and monitor for myopathy  -If PTA glimepiride is resumed, monitor for hypoglycemia   Amiodarone is metabolized by the cytochrome P450 system and therefore has the potential to cause many drug interactions. Amiodarone has an average plasma half-life of 50 days (range 20 to 100 days).   There is potential for drug interactions to occur several weeks or months after stopping treatment and the onset of drug interactions may be slow after initiating amiodarone.    Statins: Increased risk of myopathy. Simvastatin- restrict dose to  daily. Other statins: counsel patients to report any muscle pain or weakness immediately.   Anticoagulants: Amiodarone can increase anticoagulant effect. Consider warfarin dose reduction. Patients should be monitored closely and the dose of anticoagulant altered accordingly, remembering that amiodarone levels take several weeks to stabilize.   Antiepileptics: Amiodarone can increase plasma concentration of phenytoin, the dose should be reduced. Note that small changes in phenytoin dose can result in large changes in levels. Monitor patient and counsel on signs of toxicity.   Beta blockers: increased risk of bradycardia, AV block and myocardial depression. Sotalol - avoid concomitant use.    Calcium channel blockers (diltiazem and verapamil): increased risk of bradycardia, AV block and myocardial depression.    Cyclosporine: Amiodarone increases levels of cyclosporine. Reduced dose of cyclosporine is recommended.   Digoxin dose should be halved when amiodarone is started.   Diuretics:  increased risk of cardiotoxicity if hypokalemia occurs.   Oral hypoglycemic agents (glyburide, glipizide, glimepiride): increased risk of hypoglycemia. Patient's glucose levels should be monitored closely when initiating amiodarone therapy.    Drugs that prolong the QT interval:  Torsades de pointes risk may be increased with concurrent use - avoid if possible.  Monitor QTc, also keep magnesium/potassium WNL if concurrent therapy can't be avoided. Marland Kitchen Antibiotics: e.g. fluoroquinolones, erythromycin. . Antiarrhythmics: e.g. quinidine, procainamide, disopyramide, sotalol. . Antipsychotics: e.g. phenothiazines, haloperidol.  . Lithium, tricyclic antidepressants, and methadone.  York Cerise, PharmD Clinical Pharmacist 08/31/17 2:16 AM

## 2017-08-31 NOTE — Progress Notes (Signed)
Kayexalate given at this time per MD.

## 2017-09-01 ENCOUNTER — Inpatient Hospital Stay (HOSPITAL_COMMUNITY): Payer: PPO

## 2017-09-01 DIAGNOSIS — I350 Nonrheumatic aortic (valve) stenosis: Secondary | ICD-10-CM

## 2017-09-01 DIAGNOSIS — E878 Other disorders of electrolyte and fluid balance, not elsewhere classified: Secondary | ICD-10-CM

## 2017-09-01 DIAGNOSIS — J9601 Acute respiratory failure with hypoxia: Secondary | ICD-10-CM

## 2017-09-01 LAB — POCT ACTIVATED CLOTTING TIME
ACTIVATED CLOTTING TIME: 158 s
ACTIVATED CLOTTING TIME: 169 s
ACTIVATED CLOTTING TIME: 153 s
ACTIVATED CLOTTING TIME: 142 s
Activated Clotting Time: 169 seconds
Activated Clotting Time: 175 seconds
Activated Clotting Time: 175 seconds
Activated Clotting Time: 175 seconds
Activated Clotting Time: 169 seconds
Activated Clotting Time: 175 seconds
Activated Clotting Time: 147 seconds
Activated Clotting Time: 158 seconds
Activated Clotting Time: 142 seconds
Activated Clotting Time: 142 seconds
Activated Clotting Time: 142 seconds

## 2017-09-01 LAB — COOXEMETRY PANEL
Carboxyhemoglobin: 0.9 % (ref 0.5–1.5)
Carboxyhemoglobin: 0.9 % (ref 0.5–1.5)
Methemoglobin: 1.7 % — ABNORMAL HIGH (ref 0.0–1.5)
Methemoglobin: 1.4 % (ref 0.0–1.5)
O2 Saturation: 97.1 %
O2 Saturation: 64.3 %
Total hemoglobin: 8.6 g/dL — ABNORMAL LOW (ref 12.0–16.0)
Total hemoglobin: 8.3 g/dL — ABNORMAL LOW (ref 12.0–16.0)

## 2017-09-01 LAB — GLUCOSE, CAPILLARY
GLUCOSE-CAPILLARY: 316 mg/dL — AB (ref 65–99)
GLUCOSE-CAPILLARY: 368 mg/dL — AB (ref 65–99)
GLUCOSE-CAPILLARY: 398 mg/dL — AB (ref 65–99)
GLUCOSE-CAPILLARY: 407 mg/dL — AB (ref 65–99)
GLUCOSE-CAPILLARY: 407 mg/dL — AB (ref 65–99)
GLUCOSE-CAPILLARY: 409 mg/dL — AB (ref 65–99)
GLUCOSE-CAPILLARY: 412 mg/dL — AB (ref 65–99)
GLUCOSE-CAPILLARY: 429 mg/dL — AB (ref 65–99)
GLUCOSE-CAPILLARY: 432 mg/dL — AB (ref 65–99)
GLUCOSE-CAPILLARY: 442 mg/dL — AB (ref 65–99)
GLUCOSE-CAPILLARY: 456 mg/dL — AB (ref 65–99)
Glucose-Capillary: 189 mg/dL — ABNORMAL HIGH (ref 65–99)
Glucose-Capillary: 272 mg/dL — ABNORMAL HIGH (ref 65–99)
Glucose-Capillary: 412 mg/dL — ABNORMAL HIGH (ref 65–99)
Glucose-Capillary: 493 mg/dL — ABNORMAL HIGH (ref 65–99)
Glucose-Capillary: 501 mg/dL (ref 65–99)
Glucose-Capillary: 507 mg/dL (ref 65–99)
Glucose-Capillary: 429 mg/dL — ABNORMAL HIGH (ref 65–99)
Glucose-Capillary: 486 mg/dL — ABNORMAL HIGH (ref 65–99)
Glucose-Capillary: 473 mg/dL — ABNORMAL HIGH (ref 65–99)
Glucose-Capillary: 465 mg/dL — ABNORMAL HIGH (ref 65–99)
Glucose-Capillary: 411 mg/dL — ABNORMAL HIGH (ref 65–99)
Glucose-Capillary: 372 mg/dL — ABNORMAL HIGH (ref 65–99)
Glucose-Capillary: 348 mg/dL — ABNORMAL HIGH (ref 65–99)
Glucose-Capillary: 318 mg/dL — ABNORMAL HIGH (ref 65–99)

## 2017-09-01 LAB — COMPREHENSIVE METABOLIC PANEL
ALT: 17 U/L (ref 17–63)
AST: 45 U/L — ABNORMAL HIGH (ref 15–41)
Albumin: 2.1 g/dL — ABNORMAL LOW (ref 3.5–5.0)
Alkaline Phosphatase: 71 U/L (ref 38–126)
Anion gap: 10 (ref 5–15)
BUN: 19 mg/dL (ref 6–20)
CO2: 27 mmol/L (ref 22–32)
Calcium: 7.6 mg/dL — ABNORMAL LOW (ref 8.9–10.3)
Chloride: 84 mmol/L — ABNORMAL LOW (ref 101–111)
Creatinine, Ser: 1.19 mg/dL (ref 0.61–1.24)
GFR calc Af Amer: >60 mL/min (ref >60)
GFR calc non Af Amer: >60 mL/min (ref >60)
Glucose, Bld: 391 mg/dL — ABNORMAL HIGH (ref 65–99)
Potassium: 3.7 mmol/L (ref 3.5–5.1)
Sodium: 121 mmol/L — ABNORMAL LOW (ref 135–145)
Total Bilirubin: 0.8 mg/dL (ref 0.3–1.2)
Total Protein: 5.1 g/dL — ABNORMAL LOW (ref 6.5–8.1)

## 2017-09-01 LAB — CBC
HEMATOCRIT: 24.7 % — AB (ref 39.0–52.0)
HEMOGLOBIN: 8.4 g/dL — AB (ref 13.0–17.0)
MCH: 28.8 pg (ref 26.0–34.0)
MCHC: 34 g/dL (ref 30.0–36.0)
MCV: 84.6 fL (ref 78.0–100.0)
PLATELETS: 178 10*3/uL (ref 150–400)
RBC: 2.92 MIL/uL — AB (ref 4.22–5.81)
RDW: 13.7 % (ref 11.5–15.5)
WBC: 13.1 10*3/uL — AB (ref 4.0–10.5)

## 2017-09-01 LAB — ECHOCARDIOGRAM COMPLETE
AO mean calculated velocity dopler: 164 cm/s
AV Area VTI index: 0.59 cm2/m2
AV Area VTI: 1.29 cm2
AV Area mean vel: 1.12 cm2
AV Mean grad: 13 mmHg
AV Peak grad: 26 mmHg
AV VEL mean LVOT/AV: 0.44
AV area mean vel ind: 0.51 cm2/m2
AV peak Index: 0.59
AV pk vel: 254 cm/s
AV vel: 1.28
Ao pk vel: 0.51 m/s
FS: 34 % (ref 28–44)
Height: 71 in
IVS/LV PW RATIO, ED: 1.03
LA ID, A-P, ES: 50 mm
LA diam end sys: 50 mm
LA diam index: 2.3 cm/m2
LA vol A4C: 141 ml
LA vol index: 60.8 mL/m2
LA vol: 132 mL
LV PW d: 18.5 mm — AB (ref 0.6–1.1)
LVOT SV: 41 mL
LVOT VTI: 16.3 cm
LVOT area: 2.54 cm2
LVOT diameter: 18 mm
LVOT peak VTI: 0.5 cm
LVOT peak grad rest: 7 mmHg
LVOT peak vel: 129 cm/s
Lateral S' vel: 5.18 cm/s
PV Reg grad dias: 10 mmHg
PV Reg vel dias: 157 cm/s
TAPSE: 7.32 mm
VTI: 32.4 cm
Valve area index: 0.59
Valve area: 1.28 cm2
Weight: 3428.59 oz

## 2017-09-01 LAB — SODIUM: Sodium: 120 mmol/L — ABNORMAL LOW (ref 135–145)

## 2017-09-01 LAB — POCT I-STAT, CHEM 8
BUN: 23 mg/dL — AB (ref 6–20)
CALCIUM ION: 1.04 mmol/L — AB (ref 1.15–1.40)
CREATININE: 1.1 mg/dL (ref 0.61–1.24)
Chloride: 79 mmol/L — ABNORMAL LOW (ref 101–111)
GLUCOSE: 448 mg/dL — AB (ref 65–99)
HCT: 23 % — ABNORMAL LOW (ref 39.0–52.0)
HEMOGLOBIN: 7.8 g/dL — AB (ref 13.0–17.0)
Potassium: 3.3 mmol/L — ABNORMAL LOW (ref 3.5–5.1)
Sodium: 122 mmol/L — ABNORMAL LOW (ref 135–145)
TCO2: 28 mmol/L (ref 22–32)

## 2017-09-01 LAB — CALCIUM, IONIZED: Calcium, Ionized, Serum: 4 mg/dL — ABNORMAL LOW (ref 4.5–5.6)

## 2017-09-01 LAB — MAGNESIUM: Magnesium: 1.5 mg/dL — ABNORMAL LOW (ref 1.7–2.4)

## 2017-09-01 LAB — POCT I-STAT 3, ART BLOOD GAS (G3+)
Acid-Base Excess: 6 mmol/L — ABNORMAL HIGH (ref 0.0–2.0)
BICARBONATE: 29.5 mmol/L — AB (ref 20.0–28.0)
O2 Saturation: 100 %
PCO2 ART: 36.5 mmHg (ref 32.0–48.0)
PO2 ART: 164 mmHg — AB (ref 83.0–108.0)
Patient temperature: 37.4
TCO2: 31 mmol/L (ref 22–32)
pH, Arterial: 7.516 — ABNORMAL HIGH (ref 7.350–7.450)

## 2017-09-01 LAB — PROTIME-INR
INR: 1.49
Prothrombin Time: 17.9 seconds — ABNORMAL HIGH (ref 11.4–15.2)

## 2017-09-01 LAB — CULTURE, RESPIRATORY
CULTURE: NORMAL
GRAM STAIN: NONE SEEN

## 2017-09-01 LAB — PHOSPHORUS: PHOSPHORUS: 1.7 mg/dL — AB (ref 2.5–4.6)

## 2017-09-01 LAB — APTT: aPTT: 99 seconds — ABNORMAL HIGH (ref 24–36)

## 2017-09-01 LAB — HEPARIN LEVEL (UNFRACTIONATED): Heparin Unfractionated: 0.17 IU/mL — ABNORMAL LOW (ref 0.30–0.70)

## 2017-09-01 LAB — PREPARE RBC (CROSSMATCH)

## 2017-09-01 LAB — LACTATE DEHYDROGENASE: LDH: 291 U/L — ABNORMAL HIGH (ref 98–192)

## 2017-09-01 MED ORDER — ATORVASTATIN CALCIUM 80 MG PO TABS
80.0000 mg | ORAL_TABLET | Freq: Every day | ORAL | Status: DC
  Administered 2017-09-01 – 2017-09-07 (×7): 80 mg
  Filled 2017-09-01 (×8): qty 1

## 2017-09-01 MED ORDER — SODIUM CHLORIDE 0.9 % IV SOLN
INTRAVENOUS | Status: DC
  Administered 2017-09-01 – 2017-09-05 (×2): via INTRAVENOUS

## 2017-09-01 MED ORDER — VANCOMYCIN HCL IN DEXTROSE 1-5 GM/200ML-% IV SOLN: 1000.0000 mg | Freq: Two times a day (BID) | INTRAVENOUS | Status: DC

## 2017-09-01 MED ORDER — HEPARIN (PORCINE) IN NACL 100-0.45 UNIT/ML-% IJ SOLN
1000.0000 [IU]/h | INTRAMUSCULAR | Status: DC
  Administered 2017-09-01: 800 [IU]/h via INTRAVENOUS
  Administered 2017-09-02: 500 [IU]/h via INTRAVENOUS
  Administered 2017-09-02: 1300 [IU]/h via INTRAVENOUS
  Administered 2017-09-02: 195000 [IU]/h via INTRAVENOUS
  Administered 2017-09-03: 2100 [IU]/h via INTRAVENOUS
  Filled 2017-09-01 (×3): qty 250

## 2017-09-01 MED ORDER — POTASSIUM CHLORIDE 10 MEQ/50ML IV SOLN
10.0000 meq | INTRAVENOUS | Status: AC
  Administered 2017-09-01 (×4): 10 meq via INTRAVENOUS
  Filled 2017-09-01 (×4): qty 50

## 2017-09-01 MED ORDER — DIGOXIN 125 MCG PO TABS
0.1250 mg | ORAL_TABLET | Freq: Every day | ORAL | Status: DC
  Administered 2017-09-01 – 2017-09-07 (×7): 0.125 mg via ORAL
  Filled 2017-09-01 (×7): qty 1

## 2017-09-01 MED ORDER — SODIUM CHLORIDE 0.9 % IV SOLN
INTRAVENOUS | Status: DC
  Filled 2017-09-01: qty 250

## 2017-09-01 MED ORDER — NOREPINEPHRINE-SODIUM CHLORIDE 16-0.9 MG/250ML-% IV SOLN
16.0000 mg | INTRAVENOUS | Status: DC
  Filled 2017-09-01: qty 250

## 2017-09-01 MED ORDER — POTASSIUM CHLORIDE 10 MEQ/50ML IV SOLN
10.0000 meq | INTRAVENOUS | Status: AC | PRN
  Administered 2017-09-01 (×3): 10 meq via INTRAVENOUS
  Filled 2017-09-01 (×3): qty 50

## 2017-09-01 MED ORDER — MAGNESIUM SULFATE 4 GM/100ML IV SOLN
4.0000 g | Freq: Once | INTRAVENOUS | Status: AC
  Administered 2017-09-01: 4 g via INTRAVENOUS
  Filled 2017-09-01: qty 100

## 2017-09-01 MED ORDER — POTASSIUM PHOSPHATES 15 MMOLE/5ML IV SOLN
24.0000 mmol | Freq: Once | INTRAVENOUS | Status: AC
  Administered 2017-09-01: 24 mmol via INTRAVENOUS
  Filled 2017-09-01: qty 8

## 2017-09-01 MED ORDER — VANCOMYCIN HCL IN DEXTROSE 1-5 GM/200ML-% IV SOLN
1000.0000 mg | Freq: Two times a day (BID) | INTRAVENOUS | Status: DC
  Administered 2017-09-01 – 2017-09-02 (×2): 1000 mg via INTRAVENOUS
  Filled 2017-09-01 (×2): qty 200

## 2017-09-01 MED ORDER — SODIUM CHLORIDE 0.9 % IV SOLN
0.0000 ug/min | INTRAVENOUS | Status: DC
  Administered 2017-09-03: 4 ug/min via INTRAVENOUS
  Administered 2017-09-05: 10 ug/min via INTRAVENOUS
  Administered 2017-09-05: 2 ug/min via INTRAVENOUS
  Administered 2017-09-06: 12 ug/min via INTRAVENOUS
  Administered 2017-09-07: 22 ug/min via INTRAVENOUS
  Administered 2017-09-08: 24 ug/min via INTRAVENOUS
  Administered 2017-09-08: 21 ug/min via INTRAVENOUS
  Administered 2017-09-09: 18 ug/min via INTRAVENOUS
  Administered 2017-09-10: 14 ug/min via INTRAVENOUS
  Filled 2017-09-01 (×11): qty 16

## 2017-09-01 MED ORDER — SODIUM CHLORIDE 0.9 % IV SOLN
INTRAVENOUS | Status: DC
  Administered 2017-09-01: 10:00:00 via INTRAVENOUS

## 2017-09-01 MED ORDER — SODIUM CHLORIDE 0.9 % IV SOLN
Freq: Once | INTRAVENOUS | Status: AC
  Administered 2017-09-01: 10 mL via INTRAVENOUS

## 2017-09-01 MED ORDER — TOLVAPTAN 15 MG PO TABS
15.0000 mg | ORAL_TABLET | Freq: Once | ORAL | Status: AC
  Administered 2017-09-01: 15 mg via ORAL
  Filled 2017-09-01: qty 1

## 2017-09-01 MED ORDER — VANCOMYCIN HCL 1000 MG IV SOLR
1000.0000 mg | Freq: Two times a day (BID) | INTRAVENOUS | Status: DC
  Filled 2017-09-01: qty 1000

## 2017-09-01 MED FILL — Sodium Chloride IV Soln 0.9%: INTRAVENOUS | Qty: 3000 | Status: AC

## 2017-09-01 MED FILL — Mannitol IV Soln 20%: INTRAVENOUS | Qty: 500 | Status: AC

## 2017-09-01 MED FILL — Lidocaine HCl IV Inj 20 MG/ML: INTRAVENOUS | Qty: 5 | Status: AC

## 2017-09-01 MED FILL — Sodium Bicarbonate IV Soln 8.4%: INTRAVENOUS | Qty: 50 | Status: AC

## 2017-09-01 MED FILL — Heparin Sodium (Porcine) Inj 1000 Unit/ML: INTRAMUSCULAR | Qty: 10 | Status: AC

## 2017-09-01 MED FILL — Norepinephrine Bitartrate IV Soln 1 MG/ML (Base Equivalent): INTRAVENOUS | Qty: 16 | Status: AC

## 2017-09-01 MED FILL — Electrolyte-R (PH 7.4) Solution: INTRAVENOUS | Qty: 3000 | Status: AC

## 2017-09-01 NOTE — Progress Notes (Signed)
Notified Dr. Donata Clay about coox of 64.3 CVP 12.  Instructed to call Heart Failure team to evaluate LV function on echo for possible discontinuation of IABP Amy Clegg notified.

## 2017-09-01 NOTE — Progress Notes (Signed)
Dr. Shirlee Latch reviewed EKG.  Will continue to monitor pt closely.

## 2017-09-01 NOTE — Consult Note (Addendum)
Advanced Heart Failure Team Consult Note   Primary Physician: Dr Kenton Kingfisher Primary Cardiologist:  Dr Gwenlyn Found  Cardiac Surgeon: Dr Darcey Nora   Reason for Consultation: Cardiogenic Shock   HPI:    Clinton Harrington is seen today for evaluation of cardiogenic shock at the request of Dr Darcey Nora.    Clinton Harrington is a 66 year old with a history of DMII, smoker, CAD S/P PCI RCA 20 years ago, HTN, PAD, and hyperlipidemia admitted with chest pain, dyspnea, hypotension and AMS. He was admitted with acute inferior infarct with RV infarction so he underwent LHC with prox RCA-mid RCA lesion 100% stenosed, ost lad 90% stenosed, mild lad 75% stenosed, and 1st diagonal 75% stenosed. IABP was inserted and CT surgery consulted for revascularization. Creatinine was 2.8 and  thought to be from excessive Advil use at home and shock. ECHO completed and showed LVEF ~45% and mod-severe RV dysfunction and mild TR.   Once optimized he underwent CABGx4 and placement of centrimag pump for RV infarct on 08/28/2017. IABP remained in place. Post operatively he has required multiple pressors. noerpi, epi, and milrinone. Remains sedated on the vent .   Currently on norepi epi 4 mcg, milrinone 0.2 mcg, norepi 5 mcg, amio 30 mg, vasopressin 0.03 units, lasix drip 10 mg per hour. Todays CO-OX is falsely elevated 97%. Creatinine down to 1.2. Centrimag speed turned down from 3000>2700. Also IABP weaned to 1:3.    Respiratory CX- normal flora. Urine CX pending.  Swan Numbers at 8 am.  PAP 28/19 CO 4.1 CI 1.9  CVP 9.   ECHO TEE 08/28/2017   Left ventricle: Normal cavity size and wall thickness. LV systolic function is normal with an EF of 55-60%. There are no obvious wall motion abnormalities.  Septum: No Patent Foramen Ovale present.  Left atrium: Patent foramen ovale not present.  Aortic valve: The valve is trileaflet. Moderate valve calcification present. Mildly decreased leaflet separation. No stenosis. No  regurgitation.  Mitral valve: Mild regurgitation.  Right ventricle: Cavity is severely dilated. Severely reduced systolic function.  Right atrium: Cavity is dilated.  Tricuspid valve: Trace regurgitation. The tricuspid valve regurgitation jet is central.  Pulmonic valve: Trace regurgitation.  Pericardium: Trivial pericardial effusion   ECHO 08/23/2017  Left ventricle: The cavity size was normal. Wall thickness was   increased in a pattern of moderate LVH. Systolic function was   vigorous. The estimated ejection fraction was in the range of 65%   to 70%. Wall motion was normal; there were no regional wall   motion abnormalities. The study is not technically sufficient to   allow evaluation of LV diastolic function. - Aortic valve: Mildly calcified annulus. Trileaflet; normal   thickness leaflets. Valve area (VTI): 1.95 cm^2. Valve area   (Vmax): 1.73 cm^2. - Right ventricle: The cavity size was moderately dilated. The RV   shares the apex with the LV. Systolic function was moderately to   severely reduced. The RV free wall is hypokinetic. - Inferior vena cava: The vessel was dilated. The respirophasic   diameter changes were blunted (< 50%), consistent with elevated   central venous pressure. - Pericardium, extracardiac: There is a trivial circumferential   pericardial effusion Echodensity attached to the RV apex looks   most consistent with fat pad as it is adherent to the muscle wall   and moves in coordination.  LHC 08/23/2017   Prox RCA to Mid RCA lesion, 100 %stenosed.  Ost LAD lesion, 90 %stenosed.  Mid  LAD lesion, 75 %stenosed.  1st Diag lesion, 75 %stenosed.  Review of Systems: [y] = yes, _0  = no  Patient is encephalopathic and or intubated. Therefore history has been obtained from chart review.   General: Weight gain _1 ; Weight loss _2 ; Anorexia _3 ; Fatigue _4 ; Fever _5 ; Chills _6 ; Weakness _7   Cardiac: Chest pain/pressure _8 ; Resting SOB _9 ;  Exertional SOB _10 ; Orthopnea _11 ; Pedal Edema _12 ; Palpitations _13 ; Syncope _14 ; Presyncope _15 ; Paroxysmal nocturnal dyspnea_16   Pulmonary: Cough _17 ; Wheezing_18 ; Hemoptysis_19 ; Sputum _20 ; Snoring _21   GI: Vomiting_22 ; Dysphagia_23 ; Melena_24 ; Hematochezia _25 ; Heartburn_26 ; Abdominal pain _27 ; Constipation _28 ; Diarrhea _29 ; BRBPR _30   GU: Hematuria_31 ; Dysuria _32 ; Nocturia_33   Vascular: Pain in legs with walking _34 ; Pain in feet with lying flat _35 ; Non-healing sores _36 ; Stroke _37 ; TIA _38 ; Slurred speech _39 ;  Neuro: Headaches_40 ; Vertigo_41 ; Seizures_42 ; Paresthesias_43 ;Blurred vision _44 ; Diplopia _45 ; Vision changes _46   Ortho/Skin: Arthritis _47 ; Joint pain _48 ; Muscle pain _49 ; Joint swelling _50 ; Back Pain _51 ; Rash _52   Psych: Depression_53 ; Anxiety_54   Heme: Bleeding problems _55 ; Clotting disorders _56 ; Anemia _57   Endocrine: Diabetes _58 ; Thyroid dysfunction_59   Home Medications Prior to Admission medications   Medication Sig Start Date End Date Taking? Authorizing Provider  acetaminophen (TYLENOL) 650 MG CR tablet Take 1,300 mg by mouth 2 (two) times daily.   Yes [provider]  amLODipine (NORVASC) 5 MG tablet Take 5 mg by mouth daily. 11/01/16  Yes [provider]  brimonidine (ALPHAGAN) 0.15 % ophthalmic solution Place 1 drop into the right eye 2 (two) times daily.  07/21/17  Yes [provider]  carvedilol (COREG) 3.125 MG tablet Take 1 tablet (3.125 mg total) by mouth 2 (two) times daily with a meal. 11/10/12  Yes Lucretia Kern, DO  clopidogrel (PLAVIX) 75 MG tablet Take 1 tablet (75 mg total) by mouth daily. 03/25/17  Yes Waynetta Sandy, MD  gabapentin (NEURONTIN) 300 MG capsule Take 900 mg by mouth 3 (three) times daily.  11/13/15  Yes [provider]  glimepiride (AMARYL) 4 MG tablet Take 4 mg by mouth 2 (two) times daily.   Yes [provider]  lisinopril (PRINIVIL,ZESTRIL) 40 MG tablet Take 1 tablet (40 mg  total) by mouth daily. 10/30/12  Yes Colin Benton R, DO  metFORMIN (GLUCOPHAGE) 1000 MG tablet Take 1 tablet (1,000 mg total) by mouth 2 (two) times daily with a meal. 12/06/16  Yes Trinh, Kimberly A, PA-C  simvastatin (ZOCOR) 5 MG tablet Take 5 mg by mouth daily. 08/08/17  Yes [provider]  traMADol (ULTRAM) 50 MG tablet Take 1 tablet (50 mg total) by mouth every 6 (six) hours as needed for moderate pain. 12/05/16  Yes Alvia Grove, PA-C  traZODone (DESYREL) 100 MG tablet Take 100 mg by mouth at bedtime.   Yes [provider]  trimethoprim-polymyxin b (POLYTRIM) ophthalmic solution Place 1 drop into the right eye See admin instructions. Pt instills two drops into right eye four times daily For two days after monthly eye injections 07/22/17  Yes [provider]    Past Medical History: Past Medical History:  Diagnosis Date  . Arthritis   .  Diabetes ([place])   . Diabetes mellitus Jan. 1999  . High cholesterol   . Hypertension   . Myocardial infarct ([place])   . Peripheral vascular disease ([place])   . Seasonal allergies   . Smoker   . Urine incontinence     Past Surgical History: Past Surgical History:  Procedure Laterality Date  . CORONARY ARTERY BYPASS GRAFT N/A 08/28/2017   Procedure: CORONARY ARTERY BYPASS GRAFTING (CABG) times four using left internal mammary artery and right greater saphenous leg vein using endoscope.;  Surgeon: Ivin Poot, MD;  Location: [facility];  Service: Open Heart Surgery;  Laterality: N/A;  . CORONARY STENT PLACEMENT  Jan.  1999  . FEMORAL-POPLITEAL BYPASS GRAFT Left 01/03/2016   Procedure: LEFT COMMON FEMORAL-BELOW KNEE POPLITEAL ARTERY BYPASS GRAFT USING NON REVERSED TRANSLOCATED SAPHENOUS VEIN GRAFT;  Surgeon: Mal Misty, MD;  Location: [facility] [place];  Service: Vascular;  Laterality: Left;  . IABP INSERTION N/A 08/23/2017   Procedure: IABP Insertion;  Surgeon: Lorretta Harp, MD;  Location: [place] [facility];  Service:  Cardiovascular;  Laterality: N/A;  . INTRAOPERATIVE ARTERIOGRAM Left 01/03/2016   Procedure: INTRA OPERATIVE ARTERIOGRAM LEFT LEG;  Surgeon: Mal Misty, MD;  Location: [facility];  Service: Vascular;  Laterality: Left;  . LEFT HEART CATH AND CORS/GRAFTS ANGIOGRAPHY N/A 08/23/2017   Procedure: LEFT HEART CATH AND CORS/GRAFTS ANGIOGRAPHY;  Surgeon: Lorretta Harp, MD;  Location: [place] [facility];  Service: Cardiovascular;  Laterality: N/A;  . PERIPHERAL VASCULAR CATHETERIZATION N/A 01/01/2016   Procedure: Abdominal Aortogram w/Lower Extremity;  Surgeon: Conrad Succasunna, MD;  Location: [place] [facility];  Service: Cardiovascular;  Laterality: N/A;  . PERIPHERAL VASCULAR CATHETERIZATION Left 01/01/2016   Procedure: Peripheral Vascular Balloon Angioplasty;  Surgeon: Conrad Vineland, MD;  Location: [place] [facility];  Service: Cardiovascular;  Laterality: Left;  Popliteal artery  . PERIPHERAL VASCULAR CATHETERIZATION N/A 12/04/2016   Procedure: Abdominal Aortogram w/Lower Extremity;  Surgeon: Waynetta Sandy, MD;  Location: [place] [facility];  Service: Cardiovascular;  Laterality: N/A;  . PERIPHERAL VASCULAR CATHETERIZATION Left 12/04/2016   Procedure: Peripheral Vascular Balloon Angioplasty;  Surgeon: Waynetta Sandy, MD;  Location: [place] [facility];  Service: Cardiovascular;  Laterality: Left;  Fem_Pop bypass  . PLACEMENT OF CENTRIMAG VENTRICULAR ASSIST DEVICE Right 08/28/2017   Procedure: PLACEMENT OF CENTRIMAG VENTRICULAR ASSIST DEVICE;  Surgeon: Ivin Poot, MD;  Location: [place] ;  Service: Open Heart Surgery;  Laterality: Right;  . TEE WITHOUT CARDIOVERSION N/A 08/28/2017   Procedure: TRANSESOPHAGEAL ECHOCARDIOGRAM (TEE);  Surgeon: Prescott Gum, Collier Salina, MD;  Location: [facility];  Service: Open Heart Surgery;  Laterality: N/A;    Family History: Family History  Problem Relation Age of Onset  . Arthritis Mother   . Hypertension Mother   . Heart disease Maternal Grandmother   .  Mental illness Maternal Grandmother     Social History: Social History   Social History  . Marital status: Divorced    Spouse name: N/A  . Number of children: N/A  . Years of education: N/A   Social History Main Topics  . Smoking status: Current Every Day Smoker    Packs/day: 0.50    Years: 45.00    Types: Cigarettes  . Smokeless tobacco: Never Used     Comment: 1/2 pk per day  . Alcohol use No  . Drug use: No  . Sexual activity: Not Asked   Other Topics Concern  . None   Social History  Narrative   Disability since 2015.  Previously worked at [employer] for 10 years.    He lives at home with mother with dementia.   He has one daughter.     Allergies:  Allergies  Allergen Reactions  . Aspirin Other (See Comments)    Stomach upset  . Latex Rash  . Oxycodone-Acetaminophen Itching    Objective:    Vital Signs:   Temp:  [97.9 F (36.6 C)-101.3 F (38.5 C)] 99 F (37.2 C) (09/24 0845) Pulse Rate:  [28-203] 105 (09/24 0845) Resp:  [0-30] 28 (09/24 0845) BP: (90-160)/(55-98) 114/57 (09/24 0900) SpO2:  [92 %-100 %] 98 % (09/24 0845) Arterial Line BP: (85-166)/(40-74) 123/52 (09/24 0845) FiO2 (%):  [60 %-80 %] 60 % (09/24 0800) Weight:  [214 lb 4.6 oz (97.2 kg)] 214 lb 4.6 oz (97.2 kg) (09/24 0500) Last BM Date: 08/30/17  Weight change: Filed Weights   08/30/17 0700 08/31/17 0500 09/01/17 0500  Weight: 216 lb 7.9 oz (98.2 kg) 219 lb 5.7 oz (99.5 kg) 214 lb 4.6 oz (97.2 kg)    Intake/Output:   Intake/Output Summary (Last 24 hours) at 09/01/17 0929 Last data filed at 09/01/17 0900  Gross per 24 hour  Intake          5808.67 ml  Output            10950 ml  Net         -5141.33 ml      Physical Exam   CVP ~9  General:  Intubated sedated.  HEENT: normal Neck: supple. JVP . Carotids 2+ bilat; no bruits. No lymphadenopathy or thyromegaly appreciated. RIJ swan  Cor: PMI nondisplaced. Irregular rate & rhythm. No rubs, gallops or murmurs. Lungs: IW  throughout on vent  Abdomen: soft, nontender, nondistended. No hepatosplenomegaly. No bruits or masses. Good bowel sounds. Extremities: no cyanosis, clubbing, rash, edema. R IABP Neuro:intubated opens eyes. GU: foley  Telemetry   A fib 100s   EKG    No EKG today.   Labs   Basic Metabolic Panel:  Recent Labs Lab 08/28/17 2200 08/29/17 0228  08/29/17 1613 08/30/17 0254  08/30/17 2000 08/30/17 2209 08/31/17 0255 08/31/17 1605 08/31/17 1614 09/01/17 0220  NA  --  133*  < >  --  126*  < > 128* 128* 125* 123* 122* 121*  K  --  4.3  < >  --  4.4  < > 3.7 3.7 4.1 6.0* 5.9* 3.7  CL  --  98*  < >  --  95*  < > 96* 91* 93* 86* 91* 84*  CO2  --  29  --   --  25  --  24  --  25  --  23 27  GLUCOSE  --  99  < >  --  300*  < > 156* 178* 221* 325* 340* 391*  BUN  --  18  < >  --  20  < > _0 CREATININE 1.00 0.92  < > 1.08 0.94  < > 1.03 1.00 1.04 1.00 1.17 1.19  CALCIUM  --  7.6*  --   --  7.3*  --  7.3*  --  7.3*  --  7.2* 7.6*  MG 2.7* 2.5*  --  1.9  --   --  1.6*  --   --   --   --  1.5*  PHOS  --   --   --   --   --   --  2.7  --   --   --   --  1.7*  < > = values in this interval not displayed.  Liver Function Tests:  Recent Labs Lab 08/27/17 0414 08/28/17 0350 08/30/17 0254 08/31/17 0255 09/01/17 0220  AST 40 27 31 39 45*  ALT _0 15* 17  ALKPHOS 54 59 58 65 71  BILITOT 0.6 0.6 0.7 0.7 0.8  PROT 6.6 6.8 5.3* 5.1* 5.1*  ALBUMIN 3.1* 3.1* 2.4* 2.1* 2.1*   No results for input(s): LIPASE, AMYLASE in the last 168 hours. No results for input(s): AMMONIA in the last 168 hours.  CBC:  Recent Labs Lab 08/29/17 0228  08/29/17 1613 08/30/17 0254 08/30/17 1600 08/30/17 2209 08/31/17 0255 08/31/17 1605 09/01/17 0220  WBC 10.3  --  13.4* 11.6*  --   --  12.7*  --  13.1*  HGB 10.3*  < > 10.1* 9.3* 8.5* 8.8* 8.9* 10.2* 8.4*  HCT 31.3*  < > 30.7* 28.1* 25.0* 26.0* 26.5* 30.0* 24.7*  MCV 86.7  --  88.7 88.4  --   --  86.0  --  84.6  PLT 150  --   156 129*  --   --  120*  --  178  < > = values in this interval not displayed.  Cardiac Enzymes: No results for input(s): CKTOTAL, CKMB, CKMBINDEX, TROPONINI in the last 168 hours.  BNP: BNP (last 3 results)  Recent Labs  08/23/17 1927  BNP 200.0*    ProBNP (last 3 results) No results for input(s): PROBNP in the last 8760 hours.   CBG:  Recent Labs Lab 09/01/17 0505 09/01/17 0555 09/01/17 0632 09/01/17 0807 09/01/17 0904  GLUCAP 272* 412* 409* 412* 442*    Coagulation Studies:  Recent Labs  08/30/17 0254 08/31/17 0255 09/01/17 0351  LABPROT 19.0* 18.0* 17.9*  INR 1.61 1.50 1.49     Imaging   Dg Chest Port 1 View  Result Date: 09/01/2017 CLINICAL DATA:  ETT/chest tube present ,s/p cardiac surg,,resp failure EXAM: PORTABLE CHEST 1 VIEW COMPARISON:  08/31/2017 FINDINGS: Endotracheal tube terminates 3.1 cm above carina.Nasogastric tube extends beyond the inferior aspect of the film. Right internal jugular line tip at pulmonary outflow tract. There is also a right-sided PICC line which is not well visualized centrally. Mediastinal drain. Prior CABG. Bilateral chest tubes. Cardiomegaly accentuated by AP portable technique. Layering small left pleural effusion is similar. Right costophrenic angle minimally excluded. No pneumothorax. Improvement in mild interstitial edema. Persistent left base airspace disease. IMPRESSION: Slight improvement in interstitial edema. Persistent left base airspace disease and layering small left pleural effusion. Electronically Signed   By: Abigail Miyamoto M.D.   On: 09/01/2017 07:13   Dg Chest Port 1 View  Result Date: 08/31/2017 CLINICAL DATA:  Intubation. EXAM: PORTABLE CHEST 1 VIEW COMPARISON:  Earlier today as well as 08/30/2017. FINDINGS: Endotracheal tube has tip 2.5 cm above the carina. Nasogastric tube terminates below the film. Right IJ Swan-Ganz catheter has tip over the main pulmonary artery segment. Bilateral chest tubes unchanged.  Right-sided PICC line has tip over the region of the cavoatrial junction unchanged. Remaining tubes and lines unchanged. Lungs are adequately inflated with improving central bilateral airspace opacification likely improving edema and atelectasis. Persistent left base opacification likely effusion with atelectasis. Stable cardiomegaly. Remainder the exam is unchanged. IMPRESSION: Stable left base opacification likely effusion and atelectasis. Interval improvement in bilateral central airspace opacification likely improving interstitial edema. Upper cardiomegaly. Multiple tubes and lines as described. Electronically  Signed   By: Marin Olp M.D.   On: 08/31/2017 20:04      Medications:     Current Medications: . aspirin EC  325 mg Oral Daily   Or  . aspirin  324 mg Per Tube Daily  . brimonidine  1 drop Right Eye BID  . chlorhexidine gluconate (MEDLINE KIT)  15 mL Mouth Rinse BID  . Chlorhexidine Gluconate Cloth  6 each Topical Q0600  . feeding supplement (PRO-STAT SUGAR FREE 64)  30 mL Per Tube TID  . hydrocortisone sod succinate (SOLU-CORTEF) inj  50 mg Intravenous Q6H  . insulin detemir  30 Units Subcutaneous BID  . mouth rinse  15 mL Mouth Rinse 10 times per day  . metoCLOPramide (REGLAN) injection  10 mg Intravenous Q6H  . metolazone  10 mg Oral BID  . mupirocin ointment  1 application Topical BID  . pantoprazole (PROTONIX) IV  40 mg Intravenous Q12H  . sodium chloride flush  10-40 mL Intracatheter Q12H  . sodium chloride flush  3 mL Intravenous Q12H     Infusions: . sodium chloride 10 mL/hr at 09/01/17 0700  . sodium chloride    . sodium chloride 10 mL (09/01/17 0800)  . amiodarone 30 mg/hr (09/01/17 0800)  . cefTAZidime (FORTAZ)  IV Stopped (09/01/17 1610)  . EPINEPHrine 4 mg in dextrose 5% 250 mL infusion (16 mcg/mL) 4 mcg/min (09/01/17 0800)  . feeding supplement (VITAL 1.5 CAL) 1,000 mL (09/01/17 0800)  . fentaNYL infusion INTRAVENOUS 400 mcg/hr (09/01/17 0800)  .  furosemide (LASIX) infusion 10 mg/hr (09/01/17 0806)  . heparin Stopped (09/01/17 0800)  . insulin (NOVOLIN-R) infusion 11.5 Units/hr (09/01/17 0906)  . lactated ringers 10 mL/hr at 09/01/17 0700  . lactated ringers    . magnesium sulfate 1 - 4 g bolus IVPB 4 g (09/01/17 0901)  . midazolam (VERSED) infusion 4 mg/hr (09/01/17 0800)  . milrinone 0.2 mcg/kg/min (09/01/17 0800)  . norepinephrine (LEVOPHED) Adult infusion 5 mcg/min (09/01/17 0730)  . norepinephrine    . potassium chloride 10 mEq (09/01/17 0826)  . potassium PHOSPHATE IVPB (mmol)    . vancomycin Stopped (08/31/17 2100)  . vasopressin (PITRESSIN) infusion - *FOR SHOCK* 0.03 Units/min (09/01/17 0800)       Patient Profile    Clinton Harrington is a 66 year old with a history of DMII, smoker, CAD S/P PCI RCA 20 years ago, HTN, PAD, and hyperlipidemia admitted with CP and underwent LHC with IABP. After optimized, underwent CABG x4 centrimag for RV infarct and continued IABP.   Assessment/Plan   1. S/P CABG x4 and centrimag placed for RV infarct Requiring IABP  + centrimag. On multiple pressors. Norepi 5 mcg, epi 4 mcg, milrinone 0.2 mcg, and vasopressin 0.03. Per Dr Donivan Scull remove IABP later today.  2. Cardiogenic Shock- see above Multiple pressors  Norepi 5 mcg, epi 4 mcg, milrinone 0.2 mcg, and vasopressin 0.03.  IABP weaned to 1:3 + Centrimag 2700 Volume status improved. CVP 9. Cut lasix back to 7 mg per hour. . Renal function stable.  Will need to watch cardiac output while weaning IABP.  3. Acute Respiratory Failure CCM managing vent 4. AKI- creatinine on admit 2.8. Creatinine down to 1.2 5. A fib - on amio drip. Off heparin due to groin bleed and possible IABP removal.  6. Hyperlipidemia  - needs statin 7 ID: Urine and blood cultures pending. On vancomycin 8.  Smoker: prior to admit smoker. 9. Uncontrolled DM- on insulin drip.  10. Hyponatremia-  sodium 121. Give dose tolvaptan.    Length of Stay: [place], NP   09/01/2017, 9:29 AM  Advanced Heart Failure Team Pager [phone] (M-F; 7a - 4p)  Please contact [facility] for night-coverage after hours (4p -7a ) and weekends on [email or web address]  Patient see with NP, agree with the above note.   1. Cardiogenic shock: Primarily RV failure from inferior infarct with RV involvement.  I reviewed the 9/15 echo, showed RV dysfunction dilated IVC.  Now s/p CABG and Centrimag placed for RV support.  Also has IABP.  He remains on epinephrine, milrinone, norepinephrine, and vasopression.  IABP weaned this morning to 1:3.  CVP 9 today.  - Plan to re-send co-ox now, if remains stable I think we can remove IABP.  Heparin held for IABP removal, will need to be restarted afterwards as Centrimag remains in place.   - Good UOP yesterday, creatinine stable and CVP down to 9.  Would be careful about lowering CVP too much with primarily RV failure. Will cut Lasix gtt to 7 mg/hr because I also want to give a dose of tolvaptan with significant hyponatremia.  - Add digoxin as we wean support.   - Needs repeat echo, will get today.  2. CAD: Inferior MI with RV involvement at presentation, now s/p CABG.  He is getting ASA.  Add on a statin.  3. Atrial fibrillation: Post-op, on amiodarone with HR in 90s.  Rhythm difficult today, need ECG today to decipher (will order).  4. AKI: Resolved on current support.  5. Diabetes: Uncontrolled currently, will get diabetes coordinator consult.  6. Hyponatremia: As above, dose of tolvaptan today.  7. ID: Covering for HCAP with vancomycin/ceftazidime.   Loralie Champagne 09/01/2017 11:33 AM

## 2017-09-01 NOTE — Progress Notes (Signed)
PULMONARY / CRITICAL CARE MEDICINE   Name: Clinton Harrington MRN: [id number] DOB: 1951-02-12    ADMISSION DATE:  08/23/2017 CONSULTATION DATE:  08/30/17  REFERRING MD: Dr Cornelius Moras  CHIEF COMPLAINT:  Acute resp failure, hypoxia, hypercapnia  brief:  Patient is a 66 y/o Caucasian M with hx CADs/p BMS to RCA in 1999 (due to an inferior STEMI), significant PVD, HTN, Type 2 DM (non-insulin dependent), HLP, Tobacco use comes in today complaining of substernal chest pain on admission.He continued to have active CP and the patient was taken to cath lab for further intervention. FOund with RV infacrct and required emergent bypass surgery. Remained in cardiogenic shock requiring balloon pump and placement of RVAD device. Remained on vent with acute resp failure, skin closed but sternum is not closed.He has developed worsening acidosis and hypoxia with bilateral int infiltrates. He has been pos 4 liters last 2 4 hours. He remains on levophed and milrinone. Asked to assist in critical care.  CULTURES: 9/22 sputum>>>  ANTIBIOTICS: zinacef >>>9/22 ceftaz 9/22>>> Vanc>>>  SIGNIFICANT EVENTS: 08/23/2017 - admit 9/22 - intubated Cath cabg RV assist device 9/23 - On epi/levo/IABP/RVAD/vent.  CI 1.5 despite all interventions    SUBJECTIVE/OVERNIGHT/INTERVAL HX 9/24 - lasix gtt turned down by CVTS. On milrinone gtt, amio gtt. Heparin on hold due to ooze from bleeding gtt. Precede off  X 24h. On fent gtt. And pressors -> levophed, vasopressin, epi gtt with milrione and amio gtt. Sedated. Making urine now. On 50% fio2 / peep 8  VITAL SIGNS: BP 92/77   Pulse (!) 53   Temp 98.8 F (37.1 C)   Resp (!) 7   Ht  (1.803 m)   Wt 214 lb 4.6 oz (97.2 kg)   SpO2 100%   BMI 29.89 kg/m   HEMODYNAMICS: PAP: (28-52)/(19-32) 34/22 CVP:  [8 mmHg-26 mmHg] 9 mmHg CO:  [4.2 L/min] 4.2 L/min CI:  [2 L/min/m2] 2 L/min/m2  VENTILATOR SETTINGS: Vent Mode: PRVC FiO2 (%):  [70 %-80 %] 70 % Set Rate:  [28  bmp] 28 bmp Vt Set:  [530 mL-600 mL] 600 mL PEEP:  [8 cmH20] 8 cmH20 Plateau Pressure:  [26 cmH20-31 cmH20] 27 cmH20  INTAKE / OUTPUT: I/O last 3 completed shifts: In: 40981 [I.V.:7198; NG/GT:2080; IV Piggyback:1100] Out: 13470 [Urine:11440; Emesis/NG output:1100; Chest Tube:930]  PHYSICAL EXAMINATION:   General Appearance:    Looks criticall ill OBESE - yes  Head:    Normocephalic, without obvious abnormality, atraumatic  Eyes:    PERRL - yes, conjunctiva/corneas - clear      Ears:    Normal external ear canals, both ears  Nose:   NG tube - panda +  Throat:  ETT TUBE - yes , OG tube - no  Neck:   Supple,  No enlargement/tenderness/nodules     Lungs:     Clear to auscultation bilaterally, Ventilator   Synchrony - yes  Chest wall:    Has 3 chest tube, Has IABP and RVAD  Heart:    S1 and S2 normal, no murmur, CVP - na.  Pressors - yes, multopled  Abdomen:     Soft, no masses, no organomegaly  Genitalia:    Not done  Rectal:   not done  Extremities:   Extremities- no cyanosis, no clubbing, mild edema     Skin:   Intact in exposed areas . Sacral area - nna     Neurologic:   Sedation - fent gtt -> RASS - -3   PULMONARY  Recent Labs Lab 08/30/17 2204  08/31/17 0304 08/31/17 0315 08/31/17 0938 08/31/17 1211 08/31/17 1605 09/01/17 0341 09/01/17 0345  PHART 7.388  --  7.433  --  7.292* 7.345*  --  7.516*  --   PCO2ART 37.5  --  34.3  --  43.7 42.4  --  36.5  --   PO2ART 70.0*  --  73.0*  --  66.0* 62.0*  --  164.0*  --   HCO3 22.6  --  23.1  --  21.2 23.0  --  29.5*  --   TCO2 24  < > 24  --  --   O2SAT 93.0  --  96.0 67.9 91.0 89.0  --  100.0 97.1  < > = values in this interval not displayed.  CBC  Recent Labs Lab 08/30/17 0254  08/31/17 0255 08/31/17 1605 09/01/17 0220  HGB 9.3*  < > 8.9* 10.2* 8.4*  HCT 28.1*  < > 26.5* 30.0* 24.7*  WBC 11.6*  --  12.7*  --  13.1*  PLT 129*  --  120*  --  178  < > = values in this interval not  displayed.  COAGULATION  Recent Labs Lab 08/28/17 1515 08/29/17 0228 08/30/17 0254 08/31/17 0255 09/01/17 0351  INR 1.39 1.27 1.61 1.50 1.49    CARDIAC  No results for input(s): TROPONINI in the last 168 hours. No results for input(s): PROBNP in the last 168 hours.   CHEMISTRY  Recent Labs Lab 08/28/17 2200 08/29/17 0228  08/29/17 1613 08/30/17 0254  08/30/17 2000 08/30/17 2209 08/31/17 0255 08/31/17 1605 08/31/17 1614 09/01/17 0220  NA  --  133*  < >  --  126*  < > 128* 128* 125* 123* 122* 121*  K  --  4.3  < >  --  4.4  < > 3.7 3.7 4.1 6.0* 5.9* 3.7  CL  --  98*  < >  --  95*  < > 96* 91* 93* 86* 91* 84*  CO2  --  29  --   --  25  --  24  --  25  --  23 27  GLUCOSE  --  99  < >  --  300*  < > 156* 178* 221* 325* 340* 391*  BUN  --  18  < >  --  20  < > CREATININE 1.00 0.92  < > 1.08 0.94  < > 1.03 1.00 1.04 1.00 1.17 1.19  CALCIUM  --  7.6*  --   --  7.3*  --  7.3*  --  7.3*  --  7.2* 7.6*  MG 2.7* 2.5*  --  1.9  --   --  1.6*  --   --   --   --  1.5*  PHOS  --   --   --   --   --   --  2.7  --   --   --   --  1.7*  < > = values in this interval not displayed. Estimated Creatinine Clearance: 75.6 mL/min (by C-G formula based on SCr of 1.19 mg/dL).   LIVER  Recent Labs Lab 08/27/17 0414 08/28/17 0350 08/28/17 1515 08/29/17 0228 08/30/17 0254 08/31/17 0255 09/01/17 0220 09/01/17 0351  AST 40 27  --   --  31 39 45*  --   ALT 27 25  --   --  17 15* 17  --  ALKPHOS 54 59  --   --  58 65 71  --   BILITOT 0.6 0.6  --   --  0.7 0.7 0.8  --   PROT 6.6 6.8  --   --  5.3* 5.1* 5.1*  --   ALBUMIN 3.1* 3.1*  --   --  2.4* 2.1* 2.1*  --   INR  --   --  1.39 1.27 1.61 1.50  --  1.49     INFECTIOUS No results for input(s): LATICACIDVEN, PROCALCITON in the last 168 hours.   ENDOCRINE CBG (last 3)   Recent Labs  09/01/17 0505 09/01/17 0555 09/01/17 0632  GLUCAP 272* 412* 409*         IMAGING x48h  - image(s) personally  visualized  -   highlighted in bold Dg Chest Port 1 View  Result Date: 09/01/2017 CLINICAL DATA:  ETT/chest tube present ,s/p cardiac surg,,resp failure EXAM: PORTABLE CHEST 1 VIEW COMPARISON:  08/31/2017 FINDINGS: Endotracheal tube terminates 3.1 cm above carina.Nasogastric tube extends beyond the inferior aspect of the film. Right internal jugular line tip at pulmonary outflow tract. There is also a right-sided PICC line which is not well visualized centrally. Mediastinal drain. Prior CABG. Bilateral chest tubes. Cardiomegaly accentuated by AP portable technique. Layering small left pleural effusion is similar. Right costophrenic angle minimally excluded. No pneumothorax. Improvement in mild interstitial edema. Persistent left base airspace disease. IMPRESSION: Slight improvement in interstitial edema. Persistent left base airspace disease and layering small left pleural effusion. Electronically Signed   By: Jeronimo Greaves M.D.   On: 09/01/2017 07:13   Dg Chest Port 1 View  Result Date: 08/31/2017 CLINICAL DATA:  Intubation. EXAM: PORTABLE CHEST 1 VIEW COMPARISON:  Earlier today as well as 08/30/2017. FINDINGS: Endotracheal tube has tip 2.5 cm above the carina. Nasogastric tube terminates below the film. Right IJ Swan-Ganz catheter has tip over the main pulmonary artery segment. Bilateral chest tubes unchanged. Right-sided PICC line has tip over the region of the cavoatrial junction unchanged. Remaining tubes and lines unchanged. Lungs are adequately inflated with improving central bilateral airspace opacification likely improving edema and atelectasis. Persistent left base opacification likely effusion with atelectasis. Stable cardiomegaly. Remainder the exam is unchanged. IMPRESSION: Stable left base opacification likely effusion and atelectasis. Interval improvement in bilateral central airspace opacification likely improving interstitial edema. Upper cardiomegaly. Multiple tubes and lines as described.  Electronically Signed   By: Elberta Fortis M.D.   On: 08/31/2017 20:04   Dg Chest Port 1 View  Result Date: 08/31/2017 CLINICAL DATA:  History of CABG. EXAM: PORTABLE CHEST 1 VIEW COMPARISON:  August 30, 2017 FINDINGS: The ETT is in good position. The NG tube in the feeding tube terminates below today's film. Chest tubes in are stable. No pneumothorax. Increasing focal infiltrate in the right base. Increasing opacity in the retrocardiac region. Suspected pulmonary venous congestion. Stable right PICC line. IMPRESSION: 1. Support apparatus as above. 2. Increasing infiltrate in the right base. Increasing opacity in left retrocardiac region. Recommend follow-up to resolution. Electronically Signed   By: Gerome Sam III M.D   On: 08/31/2017 08:01       '  ASSESSMENT / PLAN:  PULMONARY A: Acute resp failure, PULM edema,at risk ALI Open chest sternum   09/01/2017 - > does NOT meet criteria for SBT/Extubation in setting of Acute Respiratory Failure due to Circulatory shock and pulmonary edema. Clinically favor volume overload v ALI    P:   Given rv failure would  limit peep to 10, now 8 PRVC Lasix gtt - aim negative.   CARDIOVASCULAR A:  Rv failure, cardiogenic shock, s/p CABG from inf MI   09/01/2017 ->  YES on Pressor - multiple  P:  Milrinone per cvts Levophed/Epi to MAP goals Added Solu cortef 9/23 Balloon pump assist per cards RVADS per cards/cvts amio  RENAL Lab Results  Component Value Date   CREATININE 1.19 09/01/2017   CREATININE 1.17 08/31/2017   CREATININE 1.00 08/31/2017    Recent Labs Lab 08/31/17 1605 08/31/17 1614 09/01/17 0220  K 6.0* 5.9* 3.7        A:   Hypokalemia Hypomagnesemia Hypophosphatemia   Intake/Output Summary (Last 24 hours) at 09/01/17 0809 Last data filed at 09/01/17 0700  Gross per 24 hour  Intake          5840.42 ml  Output            10175 ml  Net         -4334.58 ml   P:   lasix infusion Replete mag,. And k  and phos  GASTROINTESTINAL A:   Nutritio needs P:   per CVTS Ongoing TF   HEMATOLOGIC A:   VAD  09/01/2017\= mild ooze via chest tubes P:  Anticoagulation per cvts - Heparin on hold for few hours 09/01/2017   INFECTIOUS A:   R/o PNA P:   ceftaz  vanc Sputum sent  ENDOCRINE CBG (last 3)   Recent Labs  09/01/17 0355 09/01/17 0505 09/01/17 0555  GLUCAP 407* 272* 412*     A:   Hyperglycemia,  Relative adrenal insuff with cortisol 9.6 P:   cbg ssi  solucortef added will make glucose worse, insulin increased and on Drip.  NEUROLOGIC A:   Vent dyschrony Agitated when not sedated, follows commands   - 09/01/2017 - RASS -3 on fent gtt,. Off precedex gtt x 24h. Able to follow commands per RN P:   RASS goal: -2 fent gtt wua daily   FAMILY  - Updates: no family at bedside 9/23  - Inter-disciplinary family meet or Palliative Care meeting due by:   7 days   The patient is critically ill with multiple organ systems failure and requires high complexity decision making for assessment and support, frequent evaluation and titration of therapies, application of advanced monitoring technologies and extensive interpretation of multiple databases.   Critical Care Time devoted to patient care services described in this note is  30  Minutes. This time reflects time of care of this signee Dr Kalman Shan. This critical care time does not reflect procedure time, or teaching time or supervisory time of PA/NP/Med student/Med Resident etc but could involve care discussion time    Dr. Kalman Shan, M.D., [facility].C.P Pulmonary and Critical Care Medicine Staff Physician [facility] Pager: [phone], If no answer or between  15:00h - 7:00h: call [phone]  09/01/2017 8:10 AM

## 2017-09-01 NOTE — Progress Notes (Signed)
Patient ID: Clinton Harrington, male   DOB: [birth year], 66 y.o.   MRN: [id number] EVENING ROUNDS NOTE :     [address]       [place] [address]             [phone]                 4 Days Post-Op Procedure(s) (LRB): CORONARY ARTERY BYPASS GRAFTING (CABG) times four using left internal mammary artery and right greater saphenous leg vein using endoscope. (N/A) TRANSESOPHAGEAL ECHOCARDIOGRAM (TEE) (N/A) PLACEMENT OF CENTRIMAG VENTRICULAR ASSIST DEVICE (Right)  Total Length of Stay:  LOS: 9 days  BP (!) 112/58   Pulse 97   Temp 98.8 F (37.1 C) (Core (Comment))   Resp (!) 28   Ht  (1.803 m)   Wt 214 lb 4.6 oz (97.2 kg)   SpO2 96%   BMI 29.89 kg/m   .Intake/Output      09/23 0701 - 09/24 0700 09/24 0701 - 09/25 0700   I.V. (mL/kg) 4461.5 (45.9) 1625 (16.7)   NG/GT 1320 640   IV Piggyback 550 760   Total Intake(mL/kg) 6331.5 (65.1) 3025 (31.1)   Urine (mL/kg/hr) 8850 (3.8) 2845 (2.6)   Emesis/NG output 650    Chest Tube 820 110   Total Output 46962 2955   Net -3988.5 +70          . sodium chloride    . sodium chloride 10 mL (09/01/17 0800)  . sodium chloride 10 mL/hr at 09/01/17 0956  . sodium chloride 10 mL/hr at 09/01/17 0956  . amiodarone 30 mg/hr (09/01/17 1651)  . cefTAZidime (FORTAZ)  IV Stopped (09/01/17 1502)  . EPINEPHrine 4 mg in dextrose 5% 250 mL infusion (16 mcg/mL) 4 mcg/min (09/01/17 1626)  . feeding supplement (VITAL 1.5 CAL) 1,000 mL (09/01/17 1436)  . fentaNYL infusion INTRAVENOUS 400 mcg/hr (09/01/17 1650)  . furosemide (LASIX) infusion 7 mg/hr (09/01/17 1117)  . heparin    . insulin (NOVOLIN-R) infusion 40.9 mL/hr at 09/01/17 1802  . midazolam (VERSED) infusion 4 mg/hr (09/01/17 1810)  . milrinone 0.2 mcg/kg/min (09/01/17 0900)  . norepinephrine (LEVOPHED) Adult infusion 6 mcg/min (09/01/17 1337)  . potassium chloride 10 mEq (09/01/17 1810)  . vancomycin    . vasopressin (PITRESSIN) infusion - *FOR SHOCK* 0.03 Units/min  (09/01/17 1148)     Lab Results  Component Value Date   WBC 13.1 (H) 09/01/2017   HGB 7.8 (L) 09/01/2017   HCT 23.0 (L) 09/01/2017   PLT 178 09/01/2017   GLUCOSE 448 (H) 09/01/2017   CHOL 126 08/24/2017   TRIG 240 (H) 08/24/2017   HDL 31 (L) 08/24/2017   LDLDIRECT 128.6 10/30/2012   LDLCALC 47 08/24/2017   ALT 17 09/01/2017   AST 45 (H) 09/01/2017   NA 122 (L) 09/01/2017   K 3.3 (L) 09/01/2017   CL 79 (L) 09/01/2017   CREATININE 1.10 09/01/2017   BUN 23 (H) 09/01/2017   CO2 27 09/01/2017   TSH 0.722 08/25/2017   INR 1.49 09/01/2017   HGBA1C 5.9 (H) 08/25/2017   MICROALBUR 30.9 (H) 10/30/2012   4 days post op with right heart support IAB out today Heparin starting  Back   [name]  Beeper [phone] Office [phone] 09/01/2017 6:15 PM

## 2017-09-01 NOTE — Progress Notes (Signed)
ANTICOAGULATION CONSULT NOTE - Follow Up Consult  Pharmacy Consult for heparin Indication: IABP/Centrimag  Allergies  Allergen Reactions  . Aspirin Other (See Comments)    Stomach upset  . Latex Rash  . Oxycodone-Acetaminophen Itching    Patient Measurements: Height:  (180.3 cm) Weight: 214 lb 4.6 oz (97.2 kg) IBW/kg (Calculated) : 75.3 Heparin Dosing Weight: 90 kg  Vital Signs: Temp: 98.4 F (36.9 C) (09/24 1515) Temp Source: Core (Comment) (09/24 0800) BP: 112/58 (09/24 1500) Pulse Rate: 97 (09/24 1515)  Labs:  Recent Labs  08/30/17 0254  08/31/17 0255 08/31/17 1605 08/31/17 1614 09/01/17 0220 09/01/17 0351  HGB 9.3*  < > 8.9* 10.2*  --  8.4*  --   HCT 28.1*  < > 26.5* 30.0*  --  24.7*  --   PLT 129*  --  120*  --   --  178  --   APTT  --   --  127*  --   --   --  99*  LABPROT 19.0*  --  18.0*  --   --   --  17.9*  INR 1.61  --  1.50  --   --   --  1.49  HEPARINUNFRC 0.25*  --  0.16*  --   --   --  0.17*  CREATININE 0.94  < > 1.04 1.00 1.17 1.19  --   < > = values in this interval not displayed.  Estimated Creatinine Clearance: 75.6 mL/min (by C-G formula based on SCr of 1.19 mg/dL).   Assessment: 71 yoM presented as code STEMI now s/p cath with IABP placed an patient underwent CABG x4 9/20 requiring Centrimag placement for RV support and continued IABP for LV support.  Heparin drip for Centrimag protocol. Pt actively bleeding from femoral site overnight and this AM, heparin held per TTS with plans to remove IABP today. IABP removed this afternoon around 2pm, Centrimag still in place. Heparin will remain on hold for 8 hours post-IABP removal, to resume again at 2200 tonight per nursing protocols.   VAD flow rate currently 4-4.5, targeting ACT of 160-18. CBC stable, no further bleeding noted.   Goal of Therapy:  ACT 160-180 seconds Monitor platelets by anticoagulation protocol: Yes   Plan:  -Resume heparin gtt at 2200 tonight -Follow ACT per nursing  protocol -Monitor CBC, S/Sx bleeding  Adline Potter, PharmD Pharmacy Resident Pager: [phone]

## 2017-09-01 NOTE — Progress Notes (Signed)
Patient requiring high dose of insulin and may require > 30 units/hr of IV insulin to manage blood sugars. Discussed with RN if rate > 30 units/hr, consider calling MD to obtain order stating okay for IV insulin drips to be over 30 units per hr per glucostabilizer.  Thank you, Billy Fischer. Zari Cly, RN, MSN, CDE  Diabetes Coordinator Inpatient Glycemic Control Team Team Pager [phone] (8am-5pm) 09/01/2017 3:29 PM

## 2017-09-01 NOTE — Progress Notes (Signed)
4 Days Post-Op Procedure(s) (LRB): CORONARY ARTERY BYPASS GRAFTING (CABG) times four using left internal mammary artery and right greater saphenous leg vein using endoscope. (N/A) TRANSESOPHAGEAL ECHOCARDIOGRAM (TEE) (N/A) PLACEMENT OF CENTRIMAG VENTRICULAR ASSIST DEVICE (Right) Subjective: Sedated on vent Status post urgent CABG 4 with centrimag RVAD placed for preop artery infarct Echocardiogram performed today shows improvement of the function but still   moderate degree of dysfunction . LV looks good and aortic balloon pump removed Postoperative blood loss anemia with VAD and heparin infusion-hemoglobin 7.8 and 1 unit of packed cells given today Patient remains severely hyperglycemic despite the Glucomander protocol IV insulin infusion Good renal function with Lasix drip, goal is for CVP less than 10 Patient on tube feedings for nutrition vital 1.5 Objective:  Vital signs in last 24 hours: Temp:  [98.2 F (36.8 C)-99.9 F (37.7 C)] 98.8 F (37.1 C) (09/24 1730) Pulse Rate:  [28-203] 93 (09/24 1730) Cardiac Rhythm: Atrial fibrillation (09/24 1600) Resp:  [0-30] 28 (09/24 1730) BP: (92-160)/(56-95) 120/60 (09/24 1700) SpO2:  [94 %-100 %] 96 % (09/24 1730) Arterial Line BP: (99-166)/(41-74) 128/48 (09/24 1730) FiO2 (%):  [40 %-80 %] 40 % (09/24 1715) Weight:  [214 lb 4.6 oz (97.2 kg)] 214 lb 4.6 oz (97.2 kg) (09/24 0500)  Hemodynamic parameters for last 24 hours: PAP: (26-52)/(15-32) 32/20 CVP:  [7 mmHg-26 mmHg] 13 mmHg CO:  [4.1 L/min-4.2 L/min] 4.2 L/min CI:  [1.9 L/min/m2-2 L/min/m2] 2 L/min/m2  Intake/Output from previous day: 09/23 0701 - 09/24 0700 In: 6331.5 [I.V.:4461.5; NG/GT:1320; IV Piggyback:550] Out: 16109 [Urine:8850; Emesis/NG output:650; Chest Tube:820] Intake/Output this shift: Total I/O In: 2770.7 [I.V.:1460.7; NG/GT:600; IV Piggyback:710] Out: 2830 [Urine:2720; Chest Tube:110]       Exam    General- sedated on ventilator   Lungs- clear without  rales, wheezes   Cor- regular rate and rhythm, no murmur , gallop   Abdomen- soft, non-tender   Extremities - warm, non-tender, minimal edema   Neuro- oriented, appropriate, no focal weakness   Lab Results:  Recent Labs  08/31/17 0255  09/01/17 0220 09/01/17 1608  WBC 12.7*  --  13.1*  --   HGB 8.9*  < > 8.4* 7.8*  HCT 26.5*  < > 24.7* 23.0*  PLT 120*  --  178  --   < > = values in this interval not displayed. BMET:  Recent Labs  08/31/17 1614 09/01/17 0220 09/01/17 1155 09/01/17 1608  NA 122* 121* 120* 122*  K 5.9* 3.7  --  3.3*  CL 91* 84*  --  79*  CO2 23 27  --   --   GLUCOSE 340* 391*  --  448*  BUN 19 19  --  23*  CREATININE 1.17 1.19  --  1.10  CALCIUM 7.2* 7.6*  --   --     PT/INR:  Recent Labs  09/01/17 0351  LABPROT 17.9*  INR 1.49   ABG    Component Value Date/Time   PHART 7.516 (H) 09/01/2017 0341   HCO3 29.5 (H) 09/01/2017 0341   TCO2 28 09/01/2017 1608   ACIDBASEDEF 2.0 08/31/2017 1211   O2SAT 64.3 09/01/2017 1230   CBG (last 3)   Recent Labs  09/01/17 1503 09/01/17 1605 09/01/17 1709  GLUCAP 473* 465* 456*    Assessment/Plan: S/P Procedure(s) (LRB): CORONARY ARTERY BYPASS GRAFTING (CABG) times four using left internal mammary artery and right greater saphenous leg vein using endoscope. (N/A) TRANSESOPHAGEAL ECHOCARDIOGRAM (TEE) (N/A) PLACEMENT OF CENTRIMAG VENTRICULAR ASSIST DEVICE (Right) Transfusions  one unit of packed cells Resume heparin  8 hours after balloon pump removal then resume  maximum dose until a.m. 1000 units per hour Patient will probably need extended course of mechanical RV support for a severe completed RV infarct at the time of presentation  LOS: 9 days    Clinton Harrington 09/01/2017

## 2017-09-01 NOTE — Progress Notes (Signed)
  Echocardiogram 2D Echocardiogram has been performed.  Roosvelt Maser F 09/01/2017, 12:30 PM

## 2017-09-01 NOTE — Progress Notes (Signed)
Site area: Rt fem arterial sheath and balloon pump  removed  Prior to Removal:  Level 0 Pressure Applied For: Manual:   yes Patient Status During Pull:  Pt is intubated and sedated Post Pull Site:  Level 0 Post Pull Instructions Given:  No--Pt intubated and sedated. Nurse informed. Post Pull Pulses Present: 2+ rt dp Dressing Applied:  CCU nurse applied sterile dressing to sheath removal site due  to LVAD on same side.  Bedrest begins @ 14:00:00 Comments: Pat-RN accessed site and applied sterile dressing. No bleeding or hematoma. Rt groin stable.

## 2017-09-02 ENCOUNTER — Inpatient Hospital Stay (HOSPITAL_COMMUNITY): Payer: PPO

## 2017-09-02 DIAGNOSIS — J9690 Respiratory failure, unspecified, unspecified whether with hypoxia or hypercapnia: Secondary | ICD-10-CM

## 2017-09-02 DIAGNOSIS — J9600 Acute respiratory failure, unspecified whether with hypoxia or hypercapnia: Secondary | ICD-10-CM

## 2017-09-02 LAB — GLUCOSE, CAPILLARY
GLUCOSE-CAPILLARY: 249 mg/dL — AB (ref 65–99)
GLUCOSE-CAPILLARY: 344 mg/dL — AB (ref 65–99)
GLUCOSE-CAPILLARY: 362 mg/dL — AB (ref 65–99)
GLUCOSE-CAPILLARY: 372 mg/dL — AB (ref 65–99)
GLUCOSE-CAPILLARY: 388 mg/dL — AB (ref 65–99)
GLUCOSE-CAPILLARY: 531 mg/dL — AB (ref 65–99)
GLUCOSE-CAPILLARY: 537 mg/dL — AB (ref 65–99)
GLUCOSE-CAPILLARY: 544 mg/dL — AB (ref 65–99)
GLUCOSE-CAPILLARY: 563 mg/dL — AB (ref 65–99)
Glucose-Capillary: 318 mg/dL — ABNORMAL HIGH (ref 65–99)
Glucose-Capillary: 303 mg/dL — ABNORMAL HIGH (ref 65–99)
Glucose-Capillary: 322 mg/dL — ABNORMAL HIGH (ref 65–99)
Glucose-Capillary: 308 mg/dL — ABNORMAL HIGH (ref 65–99)
Glucose-Capillary: 386 mg/dL — ABNORMAL HIGH (ref 65–99)
Glucose-Capillary: 399 mg/dL — ABNORMAL HIGH (ref 65–99)
Glucose-Capillary: 435 mg/dL — ABNORMAL HIGH (ref 65–99)
Glucose-Capillary: 491 mg/dL — ABNORMAL HIGH (ref 65–99)
Glucose-Capillary: 514 mg/dL (ref 65–99)
Glucose-Capillary: 543 mg/dL (ref 65–99)
Glucose-Capillary: 481 mg/dL — ABNORMAL HIGH (ref 65–99)
Glucose-Capillary: 495 mg/dL — ABNORMAL HIGH (ref 65–99)
Glucose-Capillary: 451 mg/dL — ABNORMAL HIGH (ref 65–99)
Glucose-Capillary: 456 mg/dL — ABNORMAL HIGH (ref 65–99)

## 2017-09-02 LAB — POCT I-STAT, CHEM 8
BUN: 31 mg/dL — ABNORMAL HIGH (ref 6–20)
CALCIUM ION: 1.05 mmol/L — AB (ref 1.15–1.40)
Chloride: 79 mmol/L — ABNORMAL LOW (ref 101–111)
Creatinine, Ser: 1.2 mg/dL (ref 0.61–1.24)
GLUCOSE: 539 mg/dL — AB (ref 65–99)
HCT: 27 % — ABNORMAL LOW (ref 39.0–52.0)
HEMOGLOBIN: 9.2 g/dL — AB (ref 13.0–17.0)
Potassium: 3.2 mmol/L — ABNORMAL LOW (ref 3.5–5.1)
SODIUM: 123 mmol/L — AB (ref 135–145)
TCO2: 30 mmol/L (ref 22–32)

## 2017-09-02 LAB — CBC
HCT: 24.2 % — ABNORMAL LOW (ref 39.0–52.0)
HCT: 25.9 % — ABNORMAL LOW (ref 39.0–52.0)
HCT: 26 % — ABNORMAL LOW (ref 39.0–52.0)
HCT: 26.3 % — ABNORMAL LOW (ref 39.0–52.0)
Hemoglobin: 8.4 g/dL — ABNORMAL LOW (ref 13.0–17.0)
Hemoglobin: 8.8 g/dL — ABNORMAL LOW (ref 13.0–17.0)
Hemoglobin: 9.1 g/dL — ABNORMAL LOW (ref 13.0–17.0)
Hemoglobin: 9.3 g/dL — ABNORMAL LOW (ref 13.0–17.0)
MCH: 28.1 pg (ref 26.0–34.0)
MCH: 28.7 pg (ref 26.0–34.0)
MCH: 28.9 pg (ref 26.0–34.0)
MCH: 29.4 pg (ref 26.0–34.0)
MCHC: 33.5 g/dL (ref 30.0–36.0)
MCHC: 34.7 g/dL (ref 30.0–36.0)
MCHC: 35.1 g/dL (ref 30.0–36.0)
MCHC: 35.8 g/dL (ref 30.0–36.0)
MCV: 82.2 fL (ref 78.0–100.0)
MCV: 82.3 fL (ref 78.0–100.0)
MCV: 82.6 fL (ref 78.0–100.0)
MCV: 84 fL (ref 78.0–100.0)
Platelets: 143 10*3/uL — ABNORMAL LOW (ref 150–400)
Platelets: 145 10*3/uL — ABNORMAL LOW (ref 150–400)
Platelets: 156 10*3/uL (ref 150–400)
Platelets: 166 10*3/uL (ref 150–400)
RBC: 2.93 MIL/uL — ABNORMAL LOW (ref 4.22–5.81)
RBC: 3.13 MIL/uL — ABNORMAL LOW (ref 4.22–5.81)
RBC: 3.15 MIL/uL — ABNORMAL LOW (ref 4.22–5.81)
RBC: 3.16 MIL/uL — ABNORMAL LOW (ref 4.22–5.81)
RDW: 13.6 % (ref 11.5–15.5)
RDW: 13.7 % (ref 11.5–15.5)
RDW: 13.9 % (ref 11.5–15.5)
RDW: 14.2 % (ref 11.5–15.5)
WBC: 12.6 10*3/uL — ABNORMAL HIGH (ref 4.0–10.5)
WBC: 14.1 10*3/uL — ABNORMAL HIGH (ref 4.0–10.5)
WBC: 14.8 10*3/uL — ABNORMAL HIGH (ref 4.0–10.5)
WBC: 15 10*3/uL — ABNORMAL HIGH (ref 4.0–10.5)

## 2017-09-02 LAB — PREPARE RBC (CROSSMATCH)

## 2017-09-02 LAB — HEPARIN LEVEL (UNFRACTIONATED)

## 2017-09-02 LAB — POCT I-STAT 3, ART BLOOD GAS (G3+)
ACID-BASE EXCESS: 5 mmol/L — AB (ref 0.0–2.0)
Acid-Base Excess: 6 mmol/L — ABNORMAL HIGH (ref 0.0–2.0)
Acid-Base Excess: 6 mmol/L — ABNORMAL HIGH (ref 0.0–2.0)
Acid-Base Excess: 7 mmol/L — ABNORMAL HIGH (ref 0.0–2.0)
Acid-Base Excess: 9 mmol/L — ABNORMAL HIGH (ref 0.0–2.0)
BICARBONATE: 31.1 mmol/L — AB (ref 20.0–28.0)
Bicarbonate: 29.9 mmol/L — ABNORMAL HIGH (ref 20.0–28.0)
Bicarbonate: 31 mmol/L — ABNORMAL HIGH (ref 20.0–28.0)
Bicarbonate: 31.5 mmol/L — ABNORMAL HIGH (ref 20.0–28.0)
Bicarbonate: 32.3 mmol/L — ABNORMAL HIGH (ref 20.0–28.0)
O2 Saturation: 94 %
O2 Saturation: 96 %
O2 Saturation: 97 %
O2 Saturation: 98 %
O2 Saturation: 98 %
PCO2 ART: 32.6 mmHg (ref 32.0–48.0)
PCO2 ART: 47.7 mmHg (ref 32.0–48.0)
PH ART: 7.422 (ref 7.350–7.450)
PH ART: 7.432 (ref 7.350–7.450)
PH ART: 7.54 — AB (ref 7.350–7.450)
PH ART: 7.591 — AB (ref 7.350–7.450)
PO2 ART: 90 mmHg (ref 83.0–108.0)
PO2 ART: 77 mmHg — AB (ref 83.0–108.0)
Patient temperature: 36.7
TCO2: 31 mmol/L (ref 22–32)
TCO2: 32 mmol/L (ref 22–32)
TCO2: 32 mmol/L (ref 22–32)
TCO2: 33 mmol/L — AB (ref 22–32)
TCO2: 34 mmol/L — ABNORMAL HIGH (ref 22–32)
pCO2 arterial: 35 mmHg (ref 32.0–48.0)
pCO2 arterial: 46.3 mmHg (ref 32.0–48.0)
pCO2 arterial: 58.5 mmHg — ABNORMAL HIGH (ref 32.0–48.0)
pH, Arterial: 7.35 (ref 7.350–7.450)
pO2, Arterial: 68 mmHg — ABNORMAL LOW (ref 83.0–108.0)
pO2, Arterial: 88 mmHg (ref 83.0–108.0)
pO2, Arterial: 94 mmHg (ref 83.0–108.0)

## 2017-09-02 LAB — COOXEMETRY PANEL
Carboxyhemoglobin: 1 % (ref 0.5–1.5)
Methemoglobin: 1.4 % (ref 0.0–1.5)
O2 Saturation: 66.3 %
Total hemoglobin: 8.4 g/dL — ABNORMAL LOW (ref 12.0–16.0)

## 2017-09-02 LAB — COMPREHENSIVE METABOLIC PANEL
ALT: 16 U/L — ABNORMAL LOW (ref 17–63)
AST: 42 U/L — ABNORMAL HIGH (ref 15–41)
Albumin: 2.1 g/dL — ABNORMAL LOW (ref 3.5–5.0)
Alkaline Phosphatase: 81 U/L (ref 38–126)
Anion gap: 11 (ref 5–15)
BUN: 26 mg/dL — ABNORMAL HIGH (ref 6–20)
CO2: 28 mmol/L (ref 22–32)
Calcium: 7.7 mg/dL — ABNORMAL LOW (ref 8.9–10.3)
Chloride: 82 mmol/L — ABNORMAL LOW (ref 101–111)
Creatinine, Ser: 1.12 mg/dL (ref 0.61–1.24)
GFR calc Af Amer: >60 mL/min (ref >60)
GFR calc non Af Amer: >60 mL/min (ref >60)
Glucose, Bld: 306 mg/dL — ABNORMAL HIGH (ref 65–99)
Potassium: 3.1 mmol/L — ABNORMAL LOW (ref 3.5–5.1)
Sodium: 121 mmol/L — ABNORMAL LOW (ref 135–145)
Total Bilirubin: 1.3 mg/dL — ABNORMAL HIGH (ref 0.3–1.2)
Total Protein: 5.3 g/dL — ABNORMAL LOW (ref 6.5–8.1)

## 2017-09-02 LAB — POCT I-STAT 4, (NA,K, GLUC, HGB,HCT)
GLUCOSE: 451 mg/dL — AB (ref 65–99)
HCT: 26 % — ABNORMAL LOW (ref 39.0–52.0)
HEMOGLOBIN: 8.8 g/dL — AB (ref 13.0–17.0)
Potassium: 3 mmol/L — ABNORMAL LOW (ref 3.5–5.1)
SODIUM: 127 mmol/L — AB (ref 135–145)

## 2017-09-02 LAB — POCT ACTIVATED CLOTTING TIME
ACTIVATED CLOTTING TIME: 147 s
ACTIVATED CLOTTING TIME: 153 s
ACTIVATED CLOTTING TIME: 164 s
ACTIVATED CLOTTING TIME: 142 s
ACTIVATED CLOTTING TIME: 153 s
Activated Clotting Time: 142 seconds
Activated Clotting Time: 147 seconds
Activated Clotting Time: 147 seconds
Activated Clotting Time: 147 seconds
Activated Clotting Time: 153 seconds
Activated Clotting Time: 153 seconds
Activated Clotting Time: 158 seconds
Activated Clotting Time: 164 seconds
Activated Clotting Time: 164 seconds

## 2017-09-02 LAB — BASIC METABOLIC PANEL
Anion gap: 10 (ref 5–15)
Anion gap: 12 (ref 5–15)
BUN: 25 mg/dL — ABNORMAL HIGH (ref 6–20)
BUN: 28 mg/dL — AB (ref 6–20)
CALCIUM: 7.8 mg/dL — AB (ref 8.9–10.3)
CHLORIDE: 82 mmol/L — AB (ref 101–111)
CO2: 29 mmol/L (ref 22–32)
CO2: 29 mmol/L (ref 22–32)
CREATININE: 1.15 mg/dL (ref 0.61–1.24)
Calcium: 7.6 mg/dL — ABNORMAL LOW (ref 8.9–10.3)
Chloride: 82 mmol/L — ABNORMAL LOW (ref 101–111)
Creatinine, Ser: 1.15 mg/dL (ref 0.61–1.24)
GFR calc Af Amer: >60 mL/min (ref >60)
GFR calc non Af Amer: >60 mL/min (ref >60)
GFR calc non Af Amer: >60 mL/min (ref >60)
Glucose, Bld: 303 mg/dL — ABNORMAL HIGH (ref 65–99)
Glucose, Bld: 417 mg/dL — ABNORMAL HIGH (ref 65–99)
Potassium: 3 mmol/L — ABNORMAL LOW (ref 3.5–5.1)
Potassium: 3.1 mmol/L — ABNORMAL LOW (ref 3.5–5.1)
SODIUM: 123 mmol/L — AB (ref 135–145)
Sodium: 121 mmol/L — ABNORMAL LOW (ref 135–145)

## 2017-09-02 LAB — PROTIME-INR
INR: 1.18
INR: 1.19
PROTHROMBIN TIME: 15.1 s (ref 11.4–15.2)
Prothrombin Time: 14.9 seconds (ref 11.4–15.2)

## 2017-09-02 LAB — URINE CULTURE
Culture: NO GROWTH
Special Requests: NORMAL

## 2017-09-02 LAB — LACTATE DEHYDROGENASE: LDH: 373 U/L — ABNORMAL HIGH (ref 98–192)

## 2017-09-02 LAB — CALCIUM, IONIZED: Calcium, Ionized, Serum: 4.5 mg/dL (ref 4.5–5.6)

## 2017-09-02 LAB — APTT: aPTT: 57 seconds — ABNORMAL HIGH (ref 24–36)

## 2017-09-02 MED ORDER — AMIODARONE HCL IN DEXTROSE 360-4.14 MG/200ML-% IV SOLN
60.0000 mg/h | INTRAVENOUS | Status: AC
  Administered 2017-09-02 (×3): 60 mg/h via INTRAVENOUS
  Filled 2017-09-02: qty 200

## 2017-09-02 MED ORDER — POTASSIUM CHLORIDE 10 MEQ/50ML IV SOLN
INTRAVENOUS | Status: AC
  Filled 2017-09-02: qty 150

## 2017-09-02 MED ORDER — AMIODARONE LOAD VIA INFUSION
150.0000 mg | Freq: Once | INTRAVENOUS | Status: AC
  Administered 2017-09-02: 150 mg via INTRAVENOUS

## 2017-09-02 MED ORDER — HYDROCORTISONE NA SUCCINATE PF 100 MG IJ SOLR
50.0000 mg | Freq: Two times a day (BID) | INTRAMUSCULAR | Status: DC
  Administered 2017-09-02 – 2017-09-03 (×2): 50 mg via INTRAVENOUS
  Filled 2017-09-02 (×2): qty 2

## 2017-09-02 MED ORDER — INSULIN DETEMIR 100 UNIT/ML ~~LOC~~ SOLN
45.0000 [IU] | Freq: Two times a day (BID) | SUBCUTANEOUS | Status: DC
  Administered 2017-09-02: 45 [IU] via SUBCUTANEOUS
  Filled 2017-09-02 (×2): qty 0.45

## 2017-09-02 MED ORDER — POTASSIUM CHLORIDE 10 MEQ/50ML IV SOLN
10.0000 meq | INTRAVENOUS | Status: AC
  Administered 2017-09-02 (×3): 10 meq via INTRAVENOUS

## 2017-09-02 MED ORDER — TOLVAPTAN 15 MG PO TABS
30.0000 mg | ORAL_TABLET | Freq: Once | ORAL | Status: AC
  Administered 2017-09-02: 30 mg via ORAL
  Filled 2017-09-02: qty 2

## 2017-09-02 MED ORDER — ALBUMIN HUMAN 5 % IV SOLN
INTRAVENOUS | Status: AC
  Administered 2017-09-02: 12.5 g
  Filled 2017-09-02: qty 250

## 2017-09-02 MED ORDER — POTASSIUM CHLORIDE 10 MEQ/50ML IV SOLN
10.0000 meq | INTRAVENOUS | Status: AC
  Administered 2017-09-02 (×4): 10 meq via INTRAVENOUS
  Filled 2017-09-02 (×4): qty 50

## 2017-09-02 MED ORDER — CHLORHEXIDINE GLUCONATE 0.12 % MT SOLN
OROMUCOSAL | Status: AC
  Filled 2017-09-02: qty 15

## 2017-09-02 MED ORDER — POTASSIUM CHLORIDE 20 MEQ/15ML (10%) PO SOLN
40.0000 meq | Freq: Once | ORAL | Status: AC
Start: 2017-09-02 — End: 2017-09-02
  Administered 2017-09-02: 40 meq
  Filled 2017-09-02: qty 30

## 2017-09-02 MED ORDER — POTASSIUM CHLORIDE 10 MEQ/50ML IV SOLN
10.0000 meq | INTRAVENOUS | Status: AC
  Administered 2017-09-02 (×2): 10 meq via INTRAVENOUS
  Filled 2017-09-02 (×3): qty 50

## 2017-09-02 MED ORDER — VITAL 1.5 CAL PO LIQD
1000.0000 mL | ORAL | Status: DC
  Administered 2017-09-03 – 2017-09-07 (×4): 1000 mL
  Filled 2017-09-02 (×12): qty 1000

## 2017-09-02 MED ORDER — POTASSIUM CHLORIDE 10 MEQ/50ML IV SOLN
10.0000 meq | INTRAVENOUS | Status: AC
  Administered 2017-09-02 – 2017-09-03 (×6): 10 meq via INTRAVENOUS
  Filled 2017-09-02 (×6): qty 50

## 2017-09-02 MED ORDER — INSULIN DETEMIR 100 UNIT/ML ~~LOC~~ SOLN
45.0000 [IU] | Freq: Two times a day (BID) | SUBCUTANEOUS | Status: DC
  Filled 2017-09-02: qty 0.45

## 2017-09-02 MED ORDER — SODIUM PHOSPHATES 45 MMOLE/15ML IV SOLN
10.0000 mmol | Freq: Once | INTRAVENOUS | Status: AC
  Administered 2017-09-02: 10 mmol via INTRAVENOUS
  Filled 2017-09-02: qty 3.33

## 2017-09-02 MED ORDER — SODIUM CHLORIDE 0.9 % IV SOLN
Freq: Once | INTRAVENOUS | Status: AC
  Administered 2017-09-02: 01:00:00 via INTRAVENOUS

## 2017-09-02 MED ORDER — VANCOMYCIN HCL 1000 MG IV SOLR
1000.0000 mg | Freq: Two times a day (BID) | INTRAVENOUS | Status: DC
  Administered 2017-09-02: 1000 mg via INTRAVENOUS
  Filled 2017-09-02 (×2): qty 1000

## 2017-09-02 MED ORDER — FENTANYL CITRATE (PF) 2500 MCG/50ML IJ SOLN
25.0000 ug/h | Status: DC
  Administered 2017-09-02 – 2017-09-07 (×11): 400 ug/h via INTRAVENOUS
  Administered 2017-09-08: 09:00:00 via INTRAVENOUS
  Filled 2017-09-02 (×14): qty 100

## 2017-09-02 MED ORDER — AMIODARONE HCL IN DEXTROSE 360-4.14 MG/200ML-% IV SOLN
30.0000 mg/h | INTRAVENOUS | Status: DC
  Administered 2017-09-03: 30 mg/h via INTRAVENOUS
  Filled 2017-09-02: qty 200

## 2017-09-02 MED ORDER — POTASSIUM CHLORIDE 10 MEQ/50ML IV SOLN
10.0000 meq | INTRAVENOUS | Status: AC | PRN
  Administered 2017-09-02 (×3): 10 meq via INTRAVENOUS
  Filled 2017-09-02 (×3): qty 50

## 2017-09-02 MED ORDER — POTASSIUM CHLORIDE 10 MEQ/50ML IV SOLN
10.0000 meq | INTRAVENOUS | Status: DC
  Administered 2017-09-02 (×2): 10 meq via INTRAVENOUS
  Filled 2017-09-02 (×2): qty 50

## 2017-09-02 NOTE — Progress Notes (Signed)
CT surgery p.m. Rounds  Right ventricular VAD flows greater than 4.5 L/m-reduced RPMs to 3600 Excellent urine output Stable hemodynamics Heparin restarted after 2 hour hold for bleeding from right groin cannulation site Goal ACT 160-180 P.m. labs satisfactory

## 2017-09-02 NOTE — Progress Notes (Signed)
Pt's flows on the centrimag dropped from 4.0 - 4.4 to 1.1 - 1.5. CVP went from 9-10 to 16-18. No other numbers changed. Dr. Tyrone Sage notified and orders received. I will continue to monitor.

## 2017-09-02 NOTE — Progress Notes (Signed)
Patient ID: Clinton Harrington, male   DOB: 10-27-1951, 66 y.o.   MRN: [id number]     Advanced Heart Failure Rounding Note   Subjective:    Patient remains intubated/sedated.  IABP removed 9/24.  Centrimag RVAD remains in place.  He has been weaned off norepinephrine and vasopressin, remains on epinephrine at 4 and milrinone 0.2.  I/Os even, weight up.  Na remains 121, had to break up tolvaptan to get down tube, ?efficacy.   Co-ox 66% Swan numbers: CVP 15 PA 42/12 CI 2.9 => 1.8 (?accuracy of thermo CI with centrimag)  Centrimag  3800 rpm Flow 3.9 L/min  Echo (09/01/17): EF 65-70% with moderate LV hypertrophy, mildly dilated RV with moderately decreased systolic function, IVC dilated.    Objective:   Weight Range: 218 lb 11.1 oz (99.2 kg) Body mass index is 30.5 kg/m.   Vital Signs:   Temp:  [98.1 F (36.7 C)-99.1 F (37.3 C)] 98.1 F (36.7 C) (09/25 0700) Pulse Rate:  [72-147] 147 (09/25 0700) Resp:  [0-30] 28 (09/25 0700) BP: (100-161)/(50-78) 154/62 (09/25 0700) SpO2:  [94 %-100 %] 100 % (09/25 0700) Arterial Line BP: (99-222)/(41-70) 208/60 (09/25 0700) FiO2 (%):  [40 %-60 %] 40 % (09/25 0301) Weight:  [218 lb 11.1 oz (99.2 kg)] 218 lb 11.1 oz (99.2 kg) (09/25 0400) Last BM Date: 08/30/17  Weight change: Filed Weights   08/31/17 0500 09/01/17 0500 09/02/17 0400  Weight: 219 lb 5.7 oz (99.5 kg) 214 lb 4.6 oz (97.2 kg) 218 lb 11.1 oz (99.2 kg)    Intake/Output:   Intake/Output Summary (Last 24 hours) at 09/02/17 0748 Last data filed at 09/02/17 0700  Gross per 24 hour  Intake          6667.72 ml  Output             7095 ml  Net          -427.28 ml      Physical Exam    General:  Intubated/sedated.  HEENT: Normal Neck: Supple. JVP 12.  No lymphadenopathy or thyromegaly appreciated. Cor: PMI nondisplaced. Irregular rate & rhythm. No rubs, gallops or murmurs. Lungs: Decreased BS dependently Abdomen: Soft, nontender, nondistended. No hepatosplenomegaly. No  bruits or masses. Good bowel sounds. Extremities: No cyanosis, clubbing, rash,.  1+ ankle edema.  Neuro: Alert & orientedx3, cranial nerves grossly intact. moves all 4 extremities w/o difficulty. Affect pleasant   Telemetry   Suspect atypical flutter in 70s (personally reviewed)  EKG    9/25 (personally reviewed): Atypical flutter, RBBB  Labs    CBC  Recent Labs  09/02/17 0025 09/02/17 0354  WBC 12.6* 14.8*  HGB 8.4* 9.3*  HCT 24.2* 26.0*  MCV 82.6 82.3  PLT 166 409   Basic Metabolic Panel  Recent Labs  08/30/17 2000  09/01/17 0220  09/02/17 0025 09/02/17 0354  NA 128*  < > 121*  < > 121* 121*  K 3.7  < > 3.7  < > 3.0* 3.1*  CL 96*  < > 84*  < > 82* 82*  CO2 24  < > 27  --  29 28  GLUCOSE 156*  < > 391*  < > 303* 306*  BUN 19  < > 19  < > 25* 26*  CREATININE 1.03  < > 1.19  < > 1.15 1.12  CALCIUM 7.3*  < > 7.6*  --  7.6* 7.7*  MG 1.6*  --  1.5*  --   --   --  PHOS 2.7  --  1.7*  --   --   --   < > = values in this interval not displayed. Liver Function Tests  Recent Labs  09/01/17 0220 09/02/17 0354  AST 45* 42*  ALT 17 16*  ALKPHOS 71 81  BILITOT 0.8 1.3*  PROT 5.1* 5.3*  ALBUMIN 2.1* 2.1*   No results for input(s): LIPASE, AMYLASE in the last 72 hours. Cardiac Enzymes No results for input(s): CKTOTAL, CKMB, CKMBINDEX, TROPONINI in the last 72 hours.  BNP: BNP (last 3 results)  Recent Labs  08/23/17 1927  BNP 200.0*    ProBNP (last 3 results) No results for input(s): PROBNP in the last 8760 hours.   D-Dimer No results for input(s): DDIMER in the last 72 hours. Hemoglobin A1C No results for input(s): HGBA1C in the last 72 hours. Fasting Lipid Panel No results for input(s): CHOL, HDL, LDLCALC, TRIG, CHOLHDL, LDLDIRECT in the last 72 hours. Thyroid Function Tests No results for input(s): TSH, T4TOTAL, T3FREE, THYROIDAB in the last 72 hours.  Invalid input(s): FREET3  Other results:   Imaging    Dg Chest Port 1 View  Result  Date: 09/02/2017 CLINICAL DATA:  Assess support tubes. EXAM: PORTABLE CHEST 1 VIEW COMPARISON:  New portable chest x-ray of September 02, 2017 FINDINGS: The lungs are adequately inflated. The interstitial markings are more conspicuous bilaterally today. Bilateral chest tubes are stable. There is no pneumothorax nor large pleural effusion. The cardiac silhouette is enlarged. There are post CABG changes. The pulmonary vascularity is engorged. The Swan-Ganz catheter tip projects in the proximal right pulmonary outflow tract. The endotracheal tube tip lies 4 cm above the carina. The feeding tube and esophagogastric tube tips project below the inferior margin of the image. A mediastinal drain is present whose tip projects at the T3 level. A right-sided PICC line tip projects over the distal third of the SVC. Cannulae project over the heart to the right and left of midline. There is calcification in the wall of the aortic arch. IMPRESSION: CHF with slight interval increase in pulmonary edema. Improved left lower lobe atelectasis. No significant pleural effusion and no pneumothorax. The support tubes and devices are in reasonable position. Electronically Signed   By: [name]  Martinique M.D.   On: 09/02/2017 07:25   Dg Chest Port 1 View  Result Date: 09/02/2017 CLINICAL DATA:  Support apparatus evaluation EXAM: PORTABLE CHEST 1 VIEW COMPARISON:  Chest radiograph 09/01/2017 FINDINGS: The endotracheal tube is in unchanged position at the level of the clavicular heads. Bilateral chest tubes and central mediastinal drain are unchanged. The tip of the pulmonary arterial catheter remains overlying the main pulmonary artery. Right-sided PICC line tip is at the cavoatrial junction. Cardiomegaly with calcific aortic atherosclerosis is unchanged. There is persistent mild to moderate pulmonary edema and consolidation of the left lower lobe. IMPRESSION: 1. Unchanged support apparatus. 2. Persistent left lower lobe consolidation and mild  to moderate pulmonary edema. Electronically Signed   By: Ulyses Jarred M.D.   On: 09/02/2017 00:46      Medications:     Scheduled Medications: . aspirin EC  325 mg Oral Daily   Or  . aspirin  324 mg Per Tube Daily  . atorvastatin  80 mg Per Tube q1800  . brimonidine  1 drop Right Eye BID  . chlorhexidine gluconate (MEDLINE KIT)  15 mL Mouth Rinse BID  . Chlorhexidine Gluconate Cloth  6 each Topical Q0600  . digoxin  0.125 mg Oral Daily  .  feeding supplement (PRO-STAT SUGAR FREE 64)  30 mL Per Tube TID  . hydrocortisone sod succinate (SOLU-CORTEF) inj  50 mg Intravenous Q6H  . insulin detemir  30 Units Subcutaneous BID  . mouth rinse  15 mL Mouth Rinse 10 times per day  . metoCLOPramide (REGLAN) injection  10 mg Intravenous Q6H  . mupirocin ointment  1 application Topical BID  . pantoprazole (PROTONIX) IV  40 mg Intravenous Q12H  . sodium chloride flush  10-40 mL Intracatheter Q12H  . sodium chloride flush  3 mL Intravenous Q12H     Infusions: . sodium chloride    . sodium chloride 10 mL (09/01/17 0800)  . sodium chloride 10 mL/hr at 09/02/17 0000  . sodium chloride 10 mL/hr at 09/02/17 0000  . amiodarone 30 mg/hr (09/02/17 0000)  . cefTAZidime (FORTAZ)  IV Stopped (09/02/17 0530)  . EPINEPHrine 4 mg in dextrose 5% 250 mL infusion (16 mcg/mL) 4 mcg/min (09/01/17 1626)  . feeding supplement (VITAL 1.5 CAL) 1,000 mL (09/02/17 0000)  . fentaNYL infusion INTRAVENOUS 400 mcg/hr (09/02/17 0000)  . furosemide (LASIX) infusion 7 mg/hr (09/02/17 0000)  . heparin 1,500 Units/hr (09/02/17 0600)  . insulin (NOVOLIN-R) infusion 21.1 Units/hr (09/02/17 0700)  . midazolam (VERSED) infusion 4 mg/hr (09/02/17 0055)  . milrinone 0.2 mcg/kg/min (09/02/17 0000)  . norepinephrine (LEVOPHED) Adult infusion Stopped (09/02/17 0300)  . potassium chloride 10 mEq (09/02/17 0700)  . vancomycin Stopped (09/01/17 2300)  . vasopressin (PITRESSIN) infusion - *FOR SHOCK* 0.03 Units/min (09/02/17 0400)       PRN Medications:  acetaminophen (TYLENOL) oral liquid 160 mg/5 mL, bisacodyl, fentaNYL, fentaNYL (SUBLIMAZE) injection, metoprolol tartrate, midazolam, midazolam, ondansetron (ZOFRAN) IV, potassium chloride, sodium chloride flush, sodium chloride flush    Patient Profile   Clinton Harrington is a 66 year old with a history of DMII, smoker, CAD S/P PCI RCA 20 years ago, HTN, PAD, and hyperlipidemia admitted with CP and inferior MI with RV infarction.  He underwent LHC with IABP. After optimized, underwent CABG x4 centrimag for RV infarct and continued IABP.   Assessment/Plan   1. Cardiogenic shock: Primarily RV failure from inferior infarct with RV involvement.  Repeat echo on 9/24 showed vigorous LV systolic function with moderate RV dysfunction.  Now s/p CABG and Centrimag for RV support.  IABP remaoved 9/24 with good LV function. He is off norepinephrine and vasopression, remains on epinephrine at 4 and milrinone 0.2.  CI only 1.8 this morning from [place] but co-ox 66% => co-ox may be more adequate than thermodilution from [place] with RVAD in place.  CVP 15 today, he diuresed well yesterday but I/Os even and weight up.  - Increase Lasix gtt to 12 mg/hr.  Will give tolvaptan 30 mg x 1 today with hyponatremia. - Continue digoxin.    - Continue milrinone, will wean epinephrine as able today.  - Suspect RVAD will be a slow wean. Keep Swan in place for now.  2. CAD: Inferior MI with RV involvement at presentation, now s/p CABG.  He is getting ASA.  Added on a statin.  3. Atrial fibrillation/flutter: Post-op, on amiodarone with HR in 70s.  ECG yesterday showed atypical flutter, will repeat today. 4. AKI: Resolved on current support.  5. Diabetes: Difficult to control, appreciate help from diabetes coordinator.  6. Hyponatremia: As above, higher dose of tolvaptan today.  7. ID: Covering for HCAP with vancomycin/ceftazidime.   Length of Stay: [place] Performed by: Loralie Champagne  Total  critical care  time: 40 minutes  Critical care time was exclusive of separately billable procedures and treating other patients.  Critical care was necessary to treat or prevent imminent or life-threatening deterioration.  Critical care was time spent personally by me on the following activities: development of treatment plan with patient and/or surrogate as well as nursing, discussions with consultants, evaluation of patient's response to treatment, examination of patient, obtaining history from patient or surrogate, ordering and performing treatments and interventions, ordering and review of laboratory studies, ordering and review of radiographic studies, pulse oximetry and re-evaluation of patient's condition.   Loralie Champagne, MD  09/02/2017, 7:48 AM  Advanced Heart Failure Team Pager [phone] (M-F; 7a - 4p)  Please contact [facility] for night-coverage after hours (4p -7a ) and weekends on [email or web address]

## 2017-09-02 NOTE — Progress Notes (Signed)
ANTICOAGULATION CONSULT NOTE - Follow Up Consult  Pharmacy Consult for heparin Indication: IABP/Centrimag  Allergies  Allergen Reactions  . Aspirin Other (See Comments)    Stomach upset  . Latex Rash  . Oxycodone-Acetaminophen Itching    Patient Measurements: Height: $Remov 180.3 cm) Weight: 218 lb 11.1 oz (99.2 kg) IBW/kg (Calculated) : 75.3 Heparin Dosing Weight: 90 kg  Vital Signs: Temp: 98.1 F (36.7 C) (09/25 0830) Temp Source: Core (Comment) (09/25 0400) BP: 122/61 (09/25 0830) Pulse Rate: 88 (09/25 0830)  Labs:  Recent Labs  08/31/17 0255  09/01/17 0351 09/01/17 1608 09/02/17 0025 09/02/17 0354 09/02/17 0805  HGB 8.9*  < >  --  7.8* 8.4* 9.3* 9.1*  HCT 26.5*  < >  --  23.0* 24.2* 26.0* 25.9*  PLT 120*  < >  --   --  166 156 143*  APTT 127*  --  99*  --   --  57*  --   LABPROT 18.0*  --  17.9*  --  14.9 15.1  --   INR 1.50  --  1.49  --  1.18 1.19  --   HEPARINUNFRC 0.16*  --  0.17*  --   --  <0.10*  --   CREATININE 1.04  < >  --  1.10 1.15 1.12  --   < > = values in this interval not displayed.  Estimated Creatinine Clearance: 81.1 mL/min (by C-G formula based on SCr of 1.12 mg/dL).   Assessment: 75 yoM presented as code STEMI now s/p cath with IABP placed an patient underwent CABG x4 9/20 requiring Centrimag placement for RV support and continued IABP for LV support.   Heparin drip per Centrimag protocol. Pt had bleeding from femoral site yesterday now resolved, IABP removed 9/24, Centrimag still in place.    VAD flow rate currently 4, targeting ACT of 160-18. CBC stable, no further bleeding noted.   Per surgery this am on morning rounds, ok to adjust heparin per Centrimag ACT protocol, range adjusted.  Goal of Therapy:  ACT 160-180 seconds Monitor platelets by anticoagulation protocol: Yes   Plan:  -Continue heparin IV -Follow ACT per protocol -Monitor CBC, S/Sx bleeding  Sheppard Coil PharmD., BCPS Clinical Pharmacist Pager  [phone] 09/02/2017 10:00 AM

## 2017-09-02 NOTE — Progress Notes (Signed)
PULMONARY / CRITICAL CARE MEDICINE   Name: Clinton Harrington MRN: [id number] DOB: Apr 20, 1951    ADMISSION DATE:  08/23/2017 CONSULTATION DATE:  08/30/17  REFERRING MD: Dr Cornelius Moras  CHIEF COMPLAINT:  Acute resp failure, hypoxia, hypercapnia  brief:  Patient is a 66 y/o Caucasian M with hx CADs/p BMS to RCA in 1999 (due to an inferior STEMI), significant PVD, HTN, Type 2 DM (non-insulin dependent), HLP, Tobacco use comes in today complaining of substernal chest pain on admission.He continued to have active CP and the patient was taken to cath lab for further intervention. FOund with RV infacrct and required emergent bypass surgery. Remained in cardiogenic shock requiring balloon pump and placement of RVAD device. Remained on vent with acute resp failure, skin closed but sternum is not closed.He has developed worsening acidosis and hypoxia with bilateral int infiltrates. He has been pos 4 liters last 2 4 hours. He remains on levophed and milrinone. Asked to assist in critical care.  CULTURES: 9/22 sputum>>> 9/24>> Sputum>> 9/24 Blood>> 9/24 Urine>>  ANTIBIOTICS: zinacef >>>9/22- 9/24 ceftaz 9/22>>> Vanc>>>9/24>>  SIGNIFICANT EVENTS: 08/23/2017 - admit 9/22 - intubated Cath cabg RV assist device 9/23 - On epi/levo/RVAD/vent.  CI 1.8 despite all interventions 9/24>> Epi, Levo, milrinine,amio,insulin gtt, Lasix gtt, Heparin, Fent, Versed   SUBJECTIVE/OVERNIGHT/INTERVAL HX 9/24 - lasix gtt at 12 mg per CVTS. On milrinone gtt, amio gtt. Heparin gtt resumed at 1500 units,  Precede off,. On fent gtt./ versed And pressors -> levophed, vasopressin, epi gtt with milrione and amio gtt. Sedated. Good UO,  On 50% fio2 / peep 8 Overnight flows dropped to 105-109, FiO2 increased to 50% per nursing. Blood sugars ( Solumedrol) are high on high dose insulin gtt per Glucomander  VITAL SIGNS: BP (!) 127/55 (BP Location: Left Arm)   Pulse 75   Temp 98.1 F (36.7 C)   Resp 20   Ht  (1.803 m)    Wt 218 lb 11.1 oz (99.2 kg)   SpO2 100%   BMI 30.50 kg/m   HEMODYNAMICS: PAP: (25-279)/(10-261) 39/10 CVP:  [8 mmHg-22 mmHg] 13 mmHg CO:  [3.8 L/min-6.3 L/min] 3.9 L/min CI:  [1.8 L/min/m2-2.9 L/min/m2] 1.8 L/min/m2  VENTILATOR SETTINGS: Vent Mode: PRVC FiO2 (%):  [40 %] 40 % Set Rate:  [28 bmp] 28 bmp Vt Set:  [600 mL] 600 mL PEEP:  [8 cmH20] 8 cmH20 Plateau Pressure:  [21 cmH20-27 cmH20] 21 cmH20  INTAKE / OUTPUT: I/O last 3 completed shifts: In: 9466.1 [I.V.:5619.1; Blood:617; NG/GT:1920; IV Piggyback:1310] Out: 40981 [Urine:12715; Emesis/NG output:1200; Chest Tube:600]  PHYSICAL EXAMINATION:   General Appearance:    Looks criticall ill OBESE  And pale  Head:    Normocephalic, atraumatic  Eyes:    PERRL - yes, conjunctiva/corneas  Are clear     Ears:    Normal external ear canals, both ears, no drainage  Nose:   OG tube - Chor trac feeding tube  Throat:  ETT TUBE in good position , OG tube - yes  Neck:   Supple,  No LAD     Lungs:     Clear to auscultation bilaterally, , diminished per bases , vent synchrony  Chest wall:    Chest tubes x 3, minimal drainage and RVAD  Heart:    S1 and S2 normal, no murmur,  Pressors - yes as above.  Abdomen:     Soft, no masses, no organomegaly, obese  Genitalia:    Not done  Rectal:   not done  Extremities:  Extremities- no cyanosis, no clubbing, mild edema, cool to touch     Skin:   Intact in exposed areas .no rash , lesions, no mottling noted     Neurologic:   Sedation - fent gtt , Versed gtt-> RASS - -3   PULMONARY  Recent Labs Lab 08/31/17 0938 08/31/17 1211 08/31/17 1605 09/01/17 0341  09/01/17 1204 09/01/17 1230 09/01/17 1608 09/02/17 0030 09/02/17 0035 09/02/17 0609  PHART 7.292* 7.345*  --  7.516*  --   --   --   --  7.540*  --  7.591*  PCO2ART 43.7 42.4  --  36.5  --   --   --   --  35.0  --  32.6  PO2ART 66.0* 62.0*  --  164.0*  --   --   --   --  68.0*  --  88.0  HCO3 21.2 23.0  --  29.5*  --   --   --    --  29.9*  --  31.5*  TCO2 --   --   --  28 31  --  32  O2SAT 91.0 89.0  --  100.0  < > TEST WILL BE CREDITED 64.3  --  96.0 66.3 98.0  < > = values in this interval not displayed.  CBC  Recent Labs Lab 09/02/17 0025 09/02/17 0354 09/02/17 0805  HGB 8.4* 9.3* 9.1*  HCT 24.2* 26.0* 25.9*  WBC 12.6* 14.8* 15.0*  PLT 166 156 143*    COAGULATION  Recent Labs Lab 08/30/17 0254 08/31/17 0255 09/01/17 0351 09/02/17 0025 09/02/17 0354  INR 1.61 1.50 1.49 1.18 1.19    CARDIAC  No results for input(s): TROPONINI in the last 168 hours. No results for input(s): PROBNP in the last 168 hours.   CHEMISTRY  Recent Labs Lab 08/28/17 2200 08/29/17 0228  08/29/17 1613  08/30/17 2000  08/31/17 0255  08/31/17 1614 09/01/17 0220 09/01/17 1155 09/01/17 1608 09/02/17 0025 09/02/17 0354  NA  --  133*  < >  --   < > 128*  < > 125*  < > 122* 121* 120* 122* 121* 121*  K  --  4.3  < >  --   < > 3.7  < > 4.1  < > 5.9* 3.7  --  3.3* 3.0* 3.1*  CL  --  98*  < >  --   < > 96*  < > 93*  < > 91* 84*  --  79* 82* 82*  CO2  --  29  --   --   < > 24  --  25  --  23 27  --   --  29 28  GLUCOSE  --  99  < >  --   < > 156*  < > 221*  < > 340* 391*  --  448* 303* 306*  BUN  --  18  < >  --   < > 19  < > 18  < > 19 19  --  23* 25* 26*  CREATININE 1.00 0.92  < > 1.08  < > 1.03  < > 1.04  < > 1.17 1.19  --  1.10 1.15 1.12  CALCIUM  --  7.6*  --   --   < > 7.3*  --  7.3*  --  7.2* 7.6*  --   --  7.6* 7.7*  MG 2.7* 2.5*  --  1.9  --  1.6*  --   --   --   --  1.5*  --   --   --   --   PHOS  --   --   --   --   --  2.7  --   --   --   --  1.7*  --   --   --   --   < > = values in this interval not displayed. Estimated Creatinine Clearance: 81.1 mL/min (by C-G formula based on SCr of 1.12 mg/dL).   LIVER  Recent Labs Lab 08/28/17 0350  08/30/17 0254 08/31/17 0255 09/01/17 0220 09/01/17 0351 09/02/17 0025 09/02/17 0354  AST 27  --  31 39 45*  --   --  42*  ALT 25  --  17 15* 17   --   --  16*  ALKPHOS 59  --  58 65 71  --   --  81  BILITOT 0.6  --  0.7 0.7 0.8  --   --  1.3*  PROT 6.8  --  5.3* 5.1* 5.1*  --   --  5.3*  ALBUMIN 3.1*  --  2.4* 2.1* 2.1*  --   --  2.1*  INR  --   < > 1.61 1.50  --  1.49 1.18 1.19  < > = values in this interval not displayed.   INFECTIOUS No results for input(s): LATICACIDVEN, PROCALCITON in the last 168 hours.   ENDOCRINE CBG (last 3)   Recent Labs  09/02/17 0503 09/02/17 0609 09/02/17 0700  GLUCAP 308* 344* 362*         IMAGING x48h  -   highlighted in bold Dg Chest Port 1 View  Result Date: 09/02/2017 CLINICAL DATA:  Assess support tubes. EXAM: PORTABLE CHEST 1 VIEW COMPARISON:  New portable chest x-ray of September 02, 2017 FINDINGS: The lungs are adequately inflated. The interstitial markings are more conspicuous bilaterally today. Bilateral chest tubes are stable. There is no pneumothorax nor large pleural effusion. The cardiac silhouette is enlarged. There are post CABG changes. The pulmonary vascularity is engorged. The Swan-Ganz catheter tip projects in the proximal right pulmonary outflow tract. The endotracheal tube tip lies 4 cm above the carina. The feeding tube and esophagogastric tube tips project below the inferior margin of the image. A mediastinal drain is present whose tip projects at the T3 level. A right-sided PICC line tip projects over the distal third of the SVC. Cannulae project over the heart to the right and left of midline. There is calcification in the wall of the aortic arch. IMPRESSION: CHF with slight interval increase in pulmonary edema. Improved left lower lobe atelectasis. No significant pleural effusion and no pneumothorax. The support tubes and devices are in reasonable position. Electronically Signed   By: [name]  [place] M.D.   On: 09/02/2017 07:25   Dg Chest Port 1 View  Result Date: 09/02/2017 CLINICAL DATA:  Support apparatus evaluation EXAM: PORTABLE CHEST 1 VIEW COMPARISON:  Chest  radiograph 09/01/2017 FINDINGS: The endotracheal tube is in unchanged position at the level of the clavicular heads. Bilateral chest tubes and central mediastinal drain are unchanged. The tip of the pulmonary arterial catheter remains overlying the main pulmonary artery. Right-sided PICC line tip is at the cavoatrial junction. Cardiomegaly with calcific aortic atherosclerosis is unchanged. There is persistent mild to moderate pulmonary edema and consolidation of the left lower lobe. IMPRESSION: 1. Unchanged support apparatus. 2. Persistent left lower lobe consolidation and mild to moderate pulmonary edema. Electronically Signed   By: Chrisandra Netters.D.  On: 09/02/2017 00:46   Dg Chest Port 1 View  Result Date: 09/01/2017 CLINICAL DATA:  ETT/chest tube present ,s/p cardiac surg,,resp failure EXAM: PORTABLE CHEST 1 VIEW COMPARISON:  08/31/2017 FINDINGS: Endotracheal tube terminates 3.1 cm above carina.Nasogastric tube extends beyond the inferior aspect of the film. Right internal jugular line tip at pulmonary outflow tract. There is also a right-sided PICC line which is not well visualized centrally. Mediastinal drain. Prior CABG. Bilateral chest tubes. Cardiomegaly accentuated by AP portable technique. Layering small left pleural effusion is similar. Right costophrenic angle minimally excluded. No pneumothorax. Improvement in mild interstitial edema. Persistent left base airspace disease. IMPRESSION: Slight improvement in interstitial edema. Persistent left base airspace disease and layering small left pleural effusion. Electronically Signed   By: Jeronimo Greaves M.D.   On: 09/01/2017 07:13   Dg Chest Port 1 View  Result Date: 08/31/2017 CLINICAL DATA:  Intubation. EXAM: PORTABLE CHEST 1 VIEW COMPARISON:  Earlier today as well as 08/30/2017. FINDINGS: Endotracheal tube has tip 2.5 cm above the carina. Nasogastric tube terminates below the film. Right IJ Swan-Ganz catheter has tip over the main pulmonary artery  segment. Bilateral chest tubes unchanged. Right-sided PICC line has tip over the region of the cavoatrial junction unchanged. Remaining tubes and lines unchanged. Lungs are adequately inflated with improving central bilateral airspace opacification likely improving edema and atelectasis. Persistent left base opacification likely effusion with atelectasis. Stable cardiomegaly. Remainder the exam is unchanged. IMPRESSION: Stable left base opacification likely effusion and atelectasis. Interval improvement in bilateral central airspace opacification likely improving interstitial edema. Upper cardiomegaly. Multiple tubes and lines as described. Electronically Signed   By: Elberta Fortis M.D.   On: 08/31/2017 20:04       '  ASSESSMENT / PLAN:  PULMONARY A: Acute resp failure, PULM edema,at risk ALI Open chest sternum   09/02/2017 - > Patient does NOT meet criteria for SBT/Extubation in setting of Acute Respiratory Failure due to Circulatory shock and pulmonary edema. Clinically favor volume overload v ALI    P:   Given rv failure would limit peep to 10, now 8 PRVC Lasix gtt - aim negative. CXR daily ABG's per protocol    CARDIOVASCULAR A:  Rv failure, cardiogenic shock, s/p CABG from inf MI CHF/ edema per CXR   09/02/2017 ->  Multiple pressors RBBB per EKG 9/25  P:  Milrinone per cvts Levophed/Epi to MAP goals Added Solu cortef 9/23 RVADS per cards/cvts amio Lasix gtt Tele 12 Lead prn  RENAL Lab Results  Component Value Date   CREATININE 1.12 09/02/2017   CREATININE 1.15 09/02/2017   CREATININE 1.10 09/01/2017    Recent Labs Lab 09/01/17 1608 09/02/17 0025 09/02/17 0354  K 3.3* 3.0* 3.1*        A:   Hypokalemia Hypomagnesemia Hypophosphatemia Hyponatremia   Intake/Output Summary (Last 24 hours) at 09/02/17 1610 Last data filed at 09/02/17 0800  Gross per 24 hour  Intake          6852.25 ml  Output             6355 ml  Net           497.25 ml    P:   lasix infusion Replete electrolytes as needed Avoid nephrotoxic medications Trend BMET Check Mag and Phos   GASTROINTESTINAL A:   Nutritio needs At goal P:   per CVTS Ongoing TF   HEMATOLOGIC A:   VAD  09/02/2017\= mild ooze via chest tubes P:  Anticoagulation per  cvts - Heparin at 1500 Units per hour 09/02/2017   INFECTIOUS A:   R/o PNA Increased atelectasis per CXR 9/25 P:   ceftaz  vanc Sputum sent Follow Micro  Trend WBC and fever curve  ENDOCRINE CBG (last 3)   Recent Labs  09/02/17 0503 09/02/17 0609 09/02/17 0700  GLUCAP 308* 344* 362*     A:   Hyperglycemia,  High insulin needs on insulin gtt Relative adrenal insuff with cortisol 9.6 P:   cbg ssi solucortef added will make glucose worse, insulin increased and on Drip. Will  decrease Solu cortef to Q 12  NEUROLOGIC A:   Vent dyschrony Agitated when not sedated, follows commands   - 09/02/2017 - RASS -3 on fent gtt,/ versed. Off precedex gtt . Able to follow commands, MAE x 4 9/25 am per RN P:   RASS goal: -2 fent gtt Versed gtt wua daily   FAMILY  - Updates: no family at bedside 9/25  - Inter-disciplinary family meet or Palliative Care meeting due by:   7 days  Bevelyn Ngo, AGACNP-BC Pulmonary and Critical Care Medicine [facility] Pulmonary and Critical Care Pager:  [phone]  09/02/2017 8:37 AM

## 2017-09-02 NOTE — Progress Notes (Signed)
5 Days Post-Op Procedure(s) (LRB): CORONARY ARTERY BYPASS GRAFTING (CABG) times four using left internal mammary artery and right greater saphenous leg vein using endoscope. (N/A) TRANSESOPHAGEAL ECHOCARDIOGRAM (TEE) (N/A) PLACEMENT OF CENTRIMAG VENTRICULAR ASSIST DEVICE (Right) Subjective: Preoperative STEMI with completed RV infarct requiring multivessel CABG and right ventricular assist device  VAD flows down last night now improved with increased speed 3800 RPM Good cardiac output, urine output, and hemodynamics. Weaning pressors Echo yesterday showed some improvement in RV function still with moderate dysfunction but no TR Balloon pump removed yesterday and patient needed 2 unit of packed cells Back on heparin following C mag sliding scale for heparin protocol Objective: Vital signs in last 24 hours: Temp:  [98.1 F (36.7 C)-99.1 F (37.3 C)] 98.1 F (36.7 C) (09/25 0830) Pulse Rate:  [72-147] 88 (09/25 0830) Cardiac Rhythm: A-V Sequential paced (09/25 0830) Resp:  [0-30] 0 (09/25 0830) BP: (100-206)/(50-83) 122/61 (09/25 0830) SpO2:  [94 %-100 %] 100 % (09/25 0830) Arterial Line BP: (99-222)/(41-70) 157/53 (09/25 0830) FiO2 (%):  [40 %] 40 % (09/25 0752) Weight:  [218 lb 11.1 oz (99.2 kg)] 218 lb 11.1 oz (99.2 kg) (09/25 0400)  Hemodynamic parameters for last 24 hours: PAP: (25-279)/(10-261) 41/11 CVP:  [9 mmHg-22 mmHg] 13 mmHg CO:  [3.8 L/min-6.3 L/min] 3.9 L/min CI:  [1.8 L/min/m2-2.9 L/min/m2] 1.8 L/min/m2  Intake/Output from previous day: 09/24 0701 - 09/25 0700 In: 1610.9 [I.V.:3470.7; Blood:617; NG/GT:1320; IV Piggyback:1260] Out: 7095 [Urine:5875; Emesis/NG output:900; Chest Tube:320] Intake/Output this shift: Total I/O In: 439.9 [I.V.:319.9; NG/GT:70; IV Piggyback:50] Out: 480 [Urine:260; Emesis/NG output:220]      Exam    General-sedated on vent   Lungs- clear without rales, wheezes   Cor- regular rate and rhythm, no murmur , gallop   Abdomen- soft,  non-tender   Extremities - warm, non-tender, minimal edema   Neuro-  no focal weakness    Lab Results:  Recent Labs  09/02/17 0354 09/02/17 0805  WBC 14.8* 15.0*  HGB 9.3* 9.1*  HCT 26.0* 25.9*  PLT 156 143*   BMET:  Recent Labs  09/02/17 0025 09/02/17 0354  NA 121* 121*  K 3.0* 3.1*  CL 82* 82*  CO2 29 28  GLUCOSE 303* 306*  BUN 25* 26*  CREATININE 1.15 1.12  CALCIUM 7.6* 7.7*    PT/INR:  Recent Labs  09/02/17 0354  LABPROT 15.1  INR 1.19   ABG    Component Value Date/Time   PHART 7.591 (H) 09/02/2017 0609   HCO3 31.5 (H) 09/02/2017 0609   TCO2 32 09/02/2017 0609   ACIDBASEDEF 2.0 08/31/2017 1211   O2SAT 98.0 09/02/2017 0609   CBG (last 3)   Recent Labs  09/02/17 0503 09/02/17 0609 09/02/17 0700  GLUCAP 308* 344* 362*    Assessment/Plan: S/P Procedure(s) (LRB): CORONARY ARTERY BYPASS GRAFTING (CABG) times four using left internal mammary artery and right greater saphenous leg vein using endoscope. (N/A) TRANSESOPHAGEAL ECHOCARDIOGRAM (TEE) (N/A) PLACEMENT OF CENTRIMAG VENTRICULAR ASSIST DEVICE (Right) Diuresis Diabetes control Continue VAD support for right ventricular infarction Patient not ready for VAD wean since CVP's significantly increased as flows dropped transiently last night  LOS: 10 days    Kathlee Nations Trigt III 09/02/2017

## 2017-09-02 NOTE — Progress Notes (Signed)
Called Dr. Tyrone Sage regarding effectiveness of interventions for the low flows on centrimag. Asked to call Dr. Donata Clay. Spoke with Dr. Donata Clay and received and implemented those orders. I will continue to monitor.

## 2017-09-03 ENCOUNTER — Inpatient Hospital Stay (HOSPITAL_COMMUNITY): Payer: PPO

## 2017-09-03 DIAGNOSIS — I509 Heart failure, unspecified: Secondary | ICD-10-CM

## 2017-09-03 LAB — POCT I-STAT 4, (NA,K, GLUC, HGB,HCT)
Glucose, Bld: 406 mg/dL — ABNORMAL HIGH (ref 65–99)
Glucose, Bld: 371 mg/dL — ABNORMAL HIGH (ref 65–99)
HCT: 24 % — ABNORMAL LOW (ref 39.0–52.0)
HCT: 25 % — ABNORMAL LOW (ref 39.0–52.0)
Hemoglobin: 8.2 g/dL — ABNORMAL LOW (ref 13.0–17.0)
Hemoglobin: 8.5 g/dL — ABNORMAL LOW (ref 13.0–17.0)
POTASSIUM: 4.1 mmol/L (ref 3.5–5.1)
POTASSIUM: 3.4 mmol/L — AB (ref 3.5–5.1)
SODIUM: 131 mmol/L — AB (ref 135–145)
Sodium: 130 mmol/L — ABNORMAL LOW (ref 135–145)

## 2017-09-03 LAB — COMPREHENSIVE METABOLIC PANEL
ALT: 19 U/L (ref 17–63)
AST: 45 U/L — ABNORMAL HIGH (ref 15–41)
Albumin: 2.1 g/dL — ABNORMAL LOW (ref 3.5–5.0)
Alkaline Phosphatase: 97 U/L (ref 38–126)
Anion gap: 10 (ref 5–15)
BUN: 35 mg/dL — ABNORMAL HIGH (ref 6–20)
CO2: 29 mmol/L (ref 22–32)
Calcium: 7.9 mg/dL — ABNORMAL LOW (ref 8.9–10.3)
Chloride: 87 mmol/L — ABNORMAL LOW (ref 101–111)
Creatinine, Ser: 1.39 mg/dL — ABNORMAL HIGH (ref 0.61–1.24)
GFR calc Af Amer: >60 mL/min (ref >60)
GFR calc non Af Amer: 52 mL/min — ABNORMAL LOW (ref >60)
Glucose, Bld: 433 mg/dL — ABNORMAL HIGH (ref 65–99)
Potassium: 4 mmol/L (ref 3.5–5.1)
Sodium: 126 mmol/L — ABNORMAL LOW (ref 135–145)
Total Bilirubin: 0.9 mg/dL (ref 0.3–1.2)
Total Protein: 5.6 g/dL — ABNORMAL LOW (ref 6.5–8.1)

## 2017-09-03 LAB — BLOOD GAS, ARTERIAL
Acid-Base Excess: 5.9 mmol/L — ABNORMAL HIGH (ref 0.0–2.0)
Bicarbonate: 31 mmol/L — ABNORMAL HIGH (ref 20.0–28.0)
FIO2: 50
MECHVT: 600 mL
O2 Saturation: 93.8 %
PEEP: 8 cmH2O
Patient temperature: 98.6
RATE: 20 resp/min
pCO2 arterial: 54.9 mmHg — ABNORMAL HIGH (ref 32.0–48.0)
pH, Arterial: 7.371 (ref 7.350–7.450)
pO2, Arterial: 72.1 mmHg — ABNORMAL LOW (ref 83.0–108.0)

## 2017-09-03 LAB — PROTIME-INR
INR: 1.11
Prothrombin Time: 14.2 seconds (ref 11.4–15.2)

## 2017-09-03 LAB — GLUCOSE, CAPILLARY
GLUCOSE-CAPILLARY: 434 mg/dL — AB (ref 65–99)
GLUCOSE-CAPILLARY: 438 mg/dL — AB (ref 65–99)
GLUCOSE-CAPILLARY: 442 mg/dL — AB (ref 65–99)
GLUCOSE-CAPILLARY: 399 mg/dL — AB (ref 65–99)
GLUCOSE-CAPILLARY: 384 mg/dL — AB (ref 65–99)
GLUCOSE-CAPILLARY: 393 mg/dL — AB (ref 65–99)
GLUCOSE-CAPILLARY: 381 mg/dL — AB (ref 65–99)
GLUCOSE-CAPILLARY: 394 mg/dL — AB (ref 65–99)
GLUCOSE-CAPILLARY: 361 mg/dL — AB (ref 65–99)
GLUCOSE-CAPILLARY: 364 mg/dL — AB (ref 65–99)
GLUCOSE-CAPILLARY: 289 mg/dL — AB (ref 65–99)
GLUCOSE-CAPILLARY: 276 mg/dL — AB (ref 65–99)
Glucose-Capillary: 398 mg/dL — ABNORMAL HIGH (ref 65–99)
Glucose-Capillary: 404 mg/dL — ABNORMAL HIGH (ref 65–99)
Glucose-Capillary: 411 mg/dL — ABNORMAL HIGH (ref 65–99)
Glucose-Capillary: 415 mg/dL — ABNORMAL HIGH (ref 65–99)
Glucose-Capillary: 417 mg/dL — ABNORMAL HIGH (ref 65–99)
Glucose-Capillary: 423 mg/dL — ABNORMAL HIGH (ref 65–99)
Glucose-Capillary: 438 mg/dL — ABNORMAL HIGH (ref 65–99)
Glucose-Capillary: 441 mg/dL — ABNORMAL HIGH (ref 65–99)
Glucose-Capillary: 452 mg/dL — ABNORMAL HIGH (ref 65–99)

## 2017-09-03 LAB — POCT I-STAT, CHEM 8
BUN: 39 mg/dL — AB (ref 6–20)
CALCIUM ION: 1.13 mmol/L — AB (ref 1.15–1.40)
CHLORIDE: 87 mmol/L — AB (ref 101–111)
Creatinine, Ser: 1.3 mg/dL — ABNORMAL HIGH (ref 0.61–1.24)
GLUCOSE: 413 mg/dL — AB (ref 65–99)
HCT: 24 % — ABNORMAL LOW (ref 39.0–52.0)
Hemoglobin: 8.2 g/dL — ABNORMAL LOW (ref 13.0–17.0)
POTASSIUM: 3.7 mmol/L (ref 3.5–5.1)
Sodium: 131 mmol/L — ABNORMAL LOW (ref 135–145)
TCO2: 30 mmol/L (ref 22–32)

## 2017-09-03 LAB — MAGNESIUM: Magnesium: 1.7 mg/dL (ref 1.7–2.4)

## 2017-09-03 LAB — CBC
HCT: 26.3 % — ABNORMAL LOW (ref 39.0–52.0)
HCT: 25.1 % — ABNORMAL LOW (ref 39.0–52.0)
HCT: 24.8 % — ABNORMAL LOW (ref 39.0–52.0)
Hemoglobin: 9 g/dL — ABNORMAL LOW (ref 13.0–17.0)
Hemoglobin: 8.5 g/dL — ABNORMAL LOW (ref 13.0–17.0)
Hemoglobin: 8.2 g/dL — ABNORMAL LOW (ref 13.0–17.0)
MCH: 29.2 pg (ref 26.0–34.0)
MCH: 28.9 pg (ref 26.0–34.0)
MCH: 28.5 pg (ref 26.0–34.0)
MCHC: 34.2 g/dL (ref 30.0–36.0)
MCHC: 33.9 g/dL (ref 30.0–36.0)
MCHC: 33.1 g/dL (ref 30.0–36.0)
MCV: 85.4 fL (ref 78.0–100.0)
MCV: 85.4 fL (ref 78.0–100.0)
MCV: 86.1 fL (ref 78.0–100.0)
Platelets: 143 10*3/uL — ABNORMAL LOW (ref 150–400)
Platelets: 148 10*3/uL — ABNORMAL LOW (ref 150–400)
Platelets: 167 10*3/uL (ref 150–400)
RBC: 3.08 MIL/uL — ABNORMAL LOW (ref 4.22–5.81)
RBC: 2.94 MIL/uL — ABNORMAL LOW (ref 4.22–5.81)
RBC: 2.88 MIL/uL — ABNORMAL LOW (ref 4.22–5.81)
RDW: 14.6 % (ref 11.5–15.5)
RDW: 14.9 % (ref 11.5–15.5)
RDW: 15 % (ref 11.5–15.5)
WBC: 15.2 10*3/uL — ABNORMAL HIGH (ref 4.0–10.5)
WBC: 12.9 10*3/uL — ABNORMAL HIGH (ref 4.0–10.5)
WBC: 14.8 10*3/uL — ABNORMAL HIGH (ref 4.0–10.5)

## 2017-09-03 LAB — CULTURE, RESPIRATORY W GRAM STAIN
Culture: NORMAL
Special Requests: NORMAL

## 2017-09-03 LAB — LACTATE DEHYDROGENASE: LDH: 576 U/L — ABNORMAL HIGH (ref 98–192)

## 2017-09-03 LAB — POCT ACTIVATED CLOTTING TIME
ACTIVATED CLOTTING TIME: 153 s
ACTIVATED CLOTTING TIME: 158 s
ACTIVATED CLOTTING TIME: 153 s
ACTIVATED CLOTTING TIME: 153 s
ACTIVATED CLOTTING TIME: 158 s
ACTIVATED CLOTTING TIME: 153 s
ACTIVATED CLOTTING TIME: 158 s
Activated Clotting Time: 0 seconds
Activated Clotting Time: 153 seconds
Activated Clotting Time: 158 seconds
Activated Clotting Time: 164 seconds

## 2017-09-03 LAB — COOXEMETRY PANEL
Carboxyhemoglobin: 1 % (ref 0.5–1.5)
Methemoglobin: 1.5 % (ref 0.0–1.5)
O2 Saturation: 70.5 %
Total hemoglobin: 8.5 g/dL — ABNORMAL LOW (ref 12.0–16.0)

## 2017-09-03 LAB — HEPARIN LEVEL (UNFRACTIONATED): HEPARIN UNFRACTIONATED: 0.32 [IU]/mL (ref 0.30–0.70)

## 2017-09-03 LAB — VANCOMYCIN, TROUGH: Vancomycin Tr: 24 ug/mL (ref 15–20)

## 2017-09-03 LAB — APTT: aPTT: 76 seconds — ABNORMAL HIGH (ref 24–36)

## 2017-09-03 LAB — CALCIUM, IONIZED: CALCIUM, IONIZED, SERUM: 3.3 mg/dL — AB (ref 4.5–5.6)

## 2017-09-03 MED ORDER — POTASSIUM CHLORIDE 10 MEQ/50ML IV SOLN
10.0000 meq | INTRAVENOUS | Status: AC
  Administered 2017-09-03 (×3): 10 meq via INTRAVENOUS
  Filled 2017-09-03 (×3): qty 50

## 2017-09-03 MED ORDER — MAGNESIUM SULFATE 2 GM/50ML IV SOLN
2.0000 g | Freq: Once | INTRAVENOUS | Status: AC
  Administered 2017-09-03: 2 g via INTRAVENOUS
  Filled 2017-09-03: qty 50

## 2017-09-03 MED ORDER — AMIODARONE LOAD VIA INFUSION
150.0000 mg | Freq: Once | INTRAVENOUS | Status: AC
  Administered 2017-09-03: 150 mg via INTRAVENOUS

## 2017-09-03 MED ORDER — VANCOMYCIN HCL 10 G IV SOLR
1500.0000 mg | INTRAVENOUS | Status: DC
  Administered 2017-09-03 – 2017-09-10 (×8): 1500 mg via INTRAVENOUS
  Filled 2017-09-03 (×9): qty 1500

## 2017-09-03 MED ORDER — AMIODARONE HCL IN DEXTROSE 360-4.14 MG/200ML-% IV SOLN
30.0000 mg/h | INTRAVENOUS | Status: DC
  Administered 2017-09-03 – 2017-09-08 (×12): 30 mg/h via INTRAVENOUS
  Administered 2017-09-08: 10:00:00 via INTRAVENOUS
  Administered 2017-09-08 – 2017-09-16 (×16): 30 mg/h via INTRAVENOUS
  Filled 2017-09-03 (×29): qty 200

## 2017-09-03 MED ORDER — INSULIN DETEMIR 100 UNIT/ML ~~LOC~~ SOLN
55.0000 [IU] | Freq: Two times a day (BID) | SUBCUTANEOUS | Status: DC
  Administered 2017-09-03 – 2017-09-04 (×4): 55 [IU] via SUBCUTANEOUS
  Filled 2017-09-03 (×5): qty 0.55

## 2017-09-03 MED ORDER — POTASSIUM CHLORIDE 10 MEQ/50ML IV SOLN
10.0000 meq | INTRAVENOUS | Status: AC
  Administered 2017-09-03 – 2017-09-04 (×6): 10 meq via INTRAVENOUS
  Filled 2017-09-03 (×6): qty 50

## 2017-09-03 MED ORDER — HEPARIN (PORCINE) IN NACL 100-0.45 UNIT/ML-% IJ SOLN
800.0000 [IU]/h | INTRAMUSCULAR | Status: DC
  Administered 2017-09-03: 2200 [IU]/h via INTRAVENOUS
  Administered 2017-09-04 – 2017-09-05 (×2): 1500 [IU]/h via INTRAVENOUS
  Administered 2017-09-05 (×2): 500 [IU]/h via INTRAVENOUS
  Filled 2017-09-03 (×3): qty 250

## 2017-09-03 MED ORDER — AMIODARONE HCL IN DEXTROSE 360-4.14 MG/200ML-% IV SOLN
60.0000 mg/h | INTRAVENOUS | Status: AC
  Administered 2017-09-03: 60 mg/h via INTRAVENOUS
  Filled 2017-09-03: qty 200

## 2017-09-03 MED ORDER — TOLVAPTAN 15 MG PO TABS
30.0000 mg | ORAL_TABLET | Freq: Once | ORAL | Status: DC
  Filled 2017-09-03: qty 2

## 2017-09-03 MED ORDER — HYDROCORTISONE NA SUCCINATE PF 100 MG IJ SOLR
50.0000 mg | Freq: Two times a day (BID) | INTRAMUSCULAR | Status: DC
  Administered 2017-09-03: 50 mg via INTRAVENOUS
  Filled 2017-09-03: qty 2

## 2017-09-03 MED ORDER — POTASSIUM CHLORIDE 10 MEQ/50ML IV SOLN
10.0000 meq | INTRAVENOUS | Status: AC
  Administered 2017-09-03 (×3): 10 meq via INTRAVENOUS
  Filled 2017-09-03: qty 50

## 2017-09-03 NOTE — Progress Notes (Signed)
      [address]       [place] [address]             [phone]      Intubated, sedated Remains on RVAD, multiple drips In atrial fib around 110  BP 106/60   Pulse (!) 106   Temp 98.4 F (36.9 C)   Resp (!) 8   Ht  (1.803 m)   Wt 218 lb 11.1 oz (99.2 kg)   SpO2 96%   BMI 30.50 kg/m    Intake/Output Summary (Last 24 hours) at 09/03/17 1720 Last data filed at 09/03/17 1700  Gross per 24 hour  Intake          6301.04 ml  Output             6980 ml  Net          -678.96 ml   Creatinine 1.3  Clinton Spare C. Dorris Fetch, MD [facility] [phone]

## 2017-09-03 NOTE — Progress Notes (Signed)
Patient ID: Clinton Harrington, male   DOB: Aug 28, 1951, 66 y.o.   MRN: [id number]     Advanced Heart Failure Rounding Note   Subjective:    Patient remains intubated and sedated.  IABP removed 9/24.  Centrimag RVAD remains in place.  Off vasopressin. Remains on milrinone 0.2 mcg/kg/min. Remains on epi 3. norepi 3 added back on overnight with need for versed due to overbreathing vent.  Centrimag rate decreased to 3600 rpm last night.   Na 121 -> 130. 4.9 L of UO yesterday (Though I/O positive 850 cc) Weight unchanged. Continues to have dark red/black NG tube output.   He remains in atrial fibrillation, HR in 100s-110s this morning.   Co-ox 70.5% this am.  Luiz Blare numbers: CVP 12 PA 31/22 CO 5.9 CI 2.7   Centrimag  3800 rpm Flow 5 L/min  Echo (09/01/17): EF 65-70% with moderate LV hypertrophy, mildly dilated RV with moderately decreased systolic function, IVC dilated.    Objective:   Weight Range: 218 lb 11.1 oz (99.2 kg) Body mass index is 30.5 kg/m.   Vital Signs:   Temp:  [98.1 F (36.7 C)-99.1 F (37.3 C)] 98.4 F (36.9 C) (09/26 0700) Pulse Rate:  [54-183] 109 (09/26 0700) Resp:  [0-28] 20 (09/26 0700) BP: (104-206)/(46-83) 106/57 (09/26 0700) SpO2:  [92 %-100 %] 97 % (09/26 0700) Arterial Line BP: (101-192)/(44-73) 111/46 (09/26 0700) FiO2 (%):  [40 %-50 %] 50 % (09/26 0400) Weight:  [218 lb 11.1 oz (99.2 kg)] 218 lb 11.1 oz (99.2 kg) (09/26 0400) Last BM Date: 08/30/17  Weight change: Filed Weights   09/01/17 0500 09/02/17 0400 09/03/17 0400  Weight: 214 lb 4.6 oz (97.2 kg) 218 lb 11.1 oz (99.2 kg) 218 lb 11.1 oz (99.2 kg)    Intake/Output:   Intake/Output Summary (Last 24 hours) at 09/03/17 0742 Last data filed at 09/03/17 0700  Gross per 24 hour  Intake           7075.9 ml  Output             6230 ml  Net            845.9 ml      Physical Exam    General: Intubated/sedated.  HEENT: Normal Neck: Supple. JVP 11-12. Carotids 2+ bilat; no bruits.  No thyromegaly or nodule noted. Cor: PMI nondisplaced. Irregular, tachy, No M/G/R noted Lungs: Decreased BS dependently.  Abdomen: Soft, non-tender, non-distended, no HSM. No bruits or masses. +BS  Extremities: No cyanosis, clubbing, or rash. Trace to 1+ ankle edema.  Neuro: Alert & orientedx3, cranial nerves grossly intact. moves all 4 extremities w/o difficulty. Affect pleasant   Telemetry   Atrial fibrillation 100s-110s, Personally reviewed.   EKG    9/25 (personally reviewed): Atypical flutter, RBBB  Labs    CBC  Recent Labs  09/02/17 1958  09/03/17 0255 09/03/17 0459  WBC 14.1*  --  15.2*  --   HGB 8.8*  < > 9.0* 8.2*  HCT 26.3*  < > 26.3* 24.0*  MCV 84.0  --  85.4  --   PLT 145*  --  143*  --   < > = values in this interval not displayed. Basic Metabolic Panel  Recent Labs  09/01/17 0220  09/02/17 1104 09/02/17 1601  09/03/17 0255 09/03/17 0459  NA 121*  < > 123* 123*  < > 126* 130*  K 3.7  < > 3.1* 3.2*  < > 4.0 4.1  CL 84*  < >  82* 79*  --  87*  --   CO2 27  < > 29  --   --  29  --   GLUCOSE 391*  < > 417* 539*  < > 433* 406*  BUN 19  < > 28* 31*  --  35*  --   CREATININE 1.19  < > 1.15 1.20  --  1.39*  --   CALCIUM 7.6*  < > 7.8*  --   --  7.9*  --   MG 1.5*  --   --   --   --   --   --   PHOS 1.7*  --   --   --   --   --   --   < > = values in this interval not displayed. Liver Function Tests  Recent Labs  09/02/17 0354 09/03/17 0255  AST 42* 45*  ALT 16* 19  ALKPHOS 81 97  BILITOT 1.3* 0.9  PROT 5.3* 5.6*  ALBUMIN 2.1* 2.1*   No results for input(s): LIPASE, AMYLASE in the last 72 hours. Cardiac Enzymes No results for input(s): CKTOTAL, CKMB, CKMBINDEX, TROPONINI in the last 72 hours.  BNP: BNP (last 3 results)  Recent Labs  08/23/17 1927  BNP 200.0*    ProBNP (last 3 results) No results for input(s): PROBNP in the last 8760 hours.   D-Dimer No results for input(s): DDIMER in the last 72 hours. Hemoglobin A1C No results  for input(s): HGBA1C in the last 72 hours. Fasting Lipid Panel No results for input(s): CHOL, HDL, LDLCALC, TRIG, CHOLHDL, LDLDIRECT in the last 72 hours. Thyroid Function Tests No results for input(s): TSH, T4TOTAL, T3FREE, THYROIDAB in the last 72 hours.  Invalid input(s): FREET3  Other results:   Imaging    Dg Chest Port 1 View  Result Date: 09/03/2017 CLINICAL DATA:  Hypoxia EXAM: PORTABLE CHEST 1 VIEW COMPARISON:  September 02, 2017 FINDINGS: Endotracheal tube tip is 2.9 cm above the carina. Swan-Ganz catheter tip is in the main pulmonary outflow tract. Nasogastric tube tip and side port are below the diaphragm. There are chest tubes bilaterally as well as a mediastinal drain present. Temporary pacemaker wires are attached to the right heart. There is a cannula to the left and to the right of the heart, unchanged in position. No pneumothorax. There is a small left pleural effusion with atelectatic change in the left lower lobe. There is mild atelectasis in the right mid lung. Lungs elsewhere clear. Heart remains enlarged with pulmonary vascularity suggesting pulmonary venous hypertension. There is aortic atherosclerosis. IMPRESSION: Tube and catheter positions as described without evident pneumothorax. Small left pleural effusion with left lower lobe atelectasis. Mild right midlung atelectasis. Evidence of underlying pulmonary vascular congestion. There is aortic atherosclerosis. Aortic Atherosclerosis (ICD10-I70.0). Electronically Signed   By: Lowella Grip III M.D.   On: 09/03/2017 07:20     Medications:     Scheduled Medications: . aspirin EC  325 mg Oral Daily   Or  . aspirin  324 mg Per Tube Daily  . atorvastatin  80 mg Per Tube q1800  . brimonidine  1 drop Right Eye BID  . chlorhexidine gluconate (MEDLINE KIT)  15 mL Mouth Rinse BID  . Chlorhexidine Gluconate Cloth  6 each Topical Q0600  . digoxin  0.125 mg Oral Daily  . feeding supplement (PRO-STAT SUGAR FREE 64)  30  mL Per Tube TID  . hydrocortisone sod succinate (SOLU-CORTEF) inj  50 mg Intravenous Q12H  . insulin  detemir  45 Units Subcutaneous BID  . mouth rinse  15 mL Mouth Rinse 10 times per day  . mupirocin ointment  1 application Topical BID  . pantoprazole (PROTONIX) IV  40 mg Intravenous Q12H  . sodium chloride flush  10-40 mL Intracatheter Q12H  . sodium chloride flush  3 mL Intravenous Q12H    Infusions: . sodium chloride    . sodium chloride 10 mL (09/01/17 0800)  . sodium chloride 10 mL/hr at 09/02/17 2000  . sodium chloride 10 mL/hr at 09/02/17 2000  . amiodarone 30 mg/hr (09/02/17 0900)  . amiodarone 30 mg/hr (09/03/17 0530)  . cefTAZidime (FORTAZ)  IV Stopped (09/03/17 0530)  . EPINEPHrine 4 mg in dextrose 5% 250 mL infusion (16 mcg/mL) 3 mcg/min (09/03/17 0505)  . feeding supplement (VITAL 1.5 CAL) 1,000 mL (09/02/17 2000)  . fentaNYL infusion INTRAVENOUS 400 mcg/hr (09/03/17 0200)  . furosemide (LASIX) infusion 12 mg/hr (09/02/17 2000)  . heparin 2,100 Units/hr (09/02/17 2215)  . insulin (NOVOLIN-R) infusion 28.1 mL/hr at 09/03/17 0600  . midazolam (VERSED) infusion 7 mg/hr (09/03/17 0600)  . milrinone 0.2 mcg/kg/min (09/03/17 0650)  . norepinephrine (LEVOPHED) Adult infusion 3.2 mcg/min (09/03/17 0530)  . vancomycin Stopped (09/02/17 2215)  . vasopressin (PITRESSIN) infusion - *FOR SHOCK* Stopped (09/02/17 0730)    PRN Medications: acetaminophen (TYLENOL) oral liquid 160 mg/5 mL, bisacodyl, fentaNYL, fentaNYL (SUBLIMAZE) injection, metoprolol tartrate, midazolam, midazolam, ondansetron (ZOFRAN) IV, sodium chloride flush, sodium chloride flush    Patient Profile   Mr Blouch is a 66 year old with a history of DMII, smoker, CAD S/P PCI RCA 20 years ago, HTN, PAD, and hyperlipidemia admitted with CP and inferior MI with RV infarction.  He underwent LHC with IABP. After optimized, underwent CABG x4 centrimag for RV infarct and continued IABP.   Assessment/Plan   1.  Cardiogenic shock: Primarily RV failure from inferior infarct with RV involvement.  Repeat echo on 9/24 showed vigorous LV systolic function with moderate RV dysfunction.  Now s/p CABG and Centrimag for RV support.  IABP removed 9/24 with good LV function.  Slow wean of Centrimag support, decreased to 3600 rpm with good flow today at 5 L/min.  CI 2.7 from [place] with co-ox 71%.  - Continue milrionone 0.2 mcg/kg/min - CVP 12 this am. He diuresed well but weight unchanged.  Will not give tolvaptan today with Na up to 130.  In absence of tolvaptan, will increase Lasix gtt to 15 mg/hr.  - Continue digoxin for rate control and RV support.    - Wean norepi as tolerated today. On Epi at 3. Plan to wean Norepi first.  - Slow wean of RVAD, decreased to 3600 rpm.  2. CAD: Inferior MI with RV involvement at presentation, now s/p CABG.  He is getting ASA.   - Continue statin. 3. Atrial fibrillation/flutter: Post-op, HR higher in 110s today.   - Bolus amiodarone and increase to 60 mg/hr x 6 hrs then back to 30 mg/hr.  4. AKI: Creatinine higher today, follow closely.  5. Diabetes: Difficult to control, appreciate help from diabetes coordinator. No change. 6. Hyponatremia: Improved with tolvaptan yesterday. Continue to follow.  7. ID: Covering for HCAP with vancomycin/ceftazidime.   Shirley Friar, PA-C  09/03/2017, 7:42 AM  Advanced Heart Failure Team Pager [phone] (M-F; 7a - 4p)  Please contact [facility] for night-coverage after hours (4p -7a ) and weekends on [email or web address]  Patient seen with PA, agree with the above note.  I made  adjustments to the note to reflect my thoughts and performed a full exam.   RVAD speed decreased yesterday pm to 3600 rpm.  Flow ok today.  He remains on norepinephrine, epinephrine, and milrinone.  - Wean norepinephrine today.  - Slow wean of RVAD, think we could drop support a notch again this pm.   CVP 12, weight unchanged despite good diuresis yesterday.  Sodium  back to 130 so will not use tolvaptan today.  Since not giving tolvaptan, will increase Lasix to 15 mg/hr to maintain steady diuresis.  Follow creatinine closely.   Cardiac output good from [place] and by co-ox.   HR higher in atrial fibrillation today, bolus amiodarone and increase to 60 mg/hr for 6 hrs.   CRITICAL CARE Performed by: Loralie Champagne  Total critical care time: 35 minutes  Critical care time was exclusive of separately billable procedures and treating other patients.  Critical care was necessary to treat or prevent imminent or life-threatening deterioration.  Critical care was time spent personally by me on the following activities: development of treatment plan with patient and/or surrogate as well as nursing, discussions with consultants, evaluation of patient's response to treatment, examination of patient, obtaining history from patient or surrogate, ordering and performing treatments and interventions, ordering and review of laboratory studies, ordering and review of radiographic studies, pulse oximetry and re-evaluation of patient's condition.  Loralie Champagne 09/03/2017 8:55 AM

## 2017-09-03 NOTE — Progress Notes (Signed)
ANTICOAGULATION CONSULT NOTE - Follow Up Consult  Pharmacy Consult for heparin Indication: IABP/Centrimag  Allergies  Allergen Reactions  . Aspirin Other (See Comments)    Stomach upset  . Latex Rash  . Oxycodone-Acetaminophen Itching    Patient Measurements: Height:  (180.3 cm) Weight: 218 lb 11.1 oz (99.2 kg) IBW/kg (Calculated) : 75.3 Heparin Dosing Weight: 90 kg  Vital Signs: Temp: 98.4 F (36.9 C) (09/26 0700) Temp Source: Core (Comment) (09/26 0400) BP: 106/57 (09/26 0700) Pulse Rate: 109 (09/26 0700)  Labs:  Recent Labs  09/01/17 0351  09/02/17 0025 09/02/17 0354 09/02/17 0805 09/02/17 1104 09/02/17 1601 09/02/17 1958 09/02/17 2202 09/03/17 0255 09/03/17 0459  HGB  --   < > 8.4* 9.3* 9.1*  --  9.2* 8.8* 8.8* 9.0* 8.2*  HCT  --   < > 24.2* 26.0* 25.9*  --  27.0* 26.3* 26.0* 26.3* 24.0*  PLT  --   --  166 156 143*  --   --  145*  --  143*  --   APTT 99*  --   --  57*  --   --   --   --   --  76*  --   LABPROT 17.9*  --  14.9 15.1  --   --   --   --   --  14.2  --   INR 1.49  --  1.18 1.19  --   --   --   --   --  1.11  --   HEPARINUNFRC 0.17*  --   --  <0.10*  --   --   --   --   --  0.32  --   CREATININE  --   < > 1.15 1.12  --  1.15 1.20  --   --  1.39*  --   < > = values in this interval not displayed.  Estimated Creatinine Clearance: 65.3 mL/min (A) (by C-G formula based on SCr of 1.39 mg/dL (H)).   Assessment: 69 yoM presented as code STEMI now s/p cath with IABP placed an patient underwent CABG x4 9/20 requiring Centrimag placement for RV support and continued IABP for LV support.   Heparin drip per Centrimag protocol. Pt had bleeding from femoral site on 9/24 and 9/25now resolved after holding heparin for 2 hours, IABP removed 9/24, Centrimag still in place with possible closure on 9/27.  Hgb down slightly to 8.2 this am, NG output is bloody/dark red with output of over the past 24 hours, still with significant CT output of past  24 hours.  VAD flow rate currently 4-5, targeting ACT of 160-180. Current ACT 150s with heparin infusion now up to 2100 units/hr. Heparin level also elevated at 0.3 this am.  LDH trending up 260s>373>576  Goal of Therapy:  ACT 160-180 seconds Monitor platelets by anticoagulation protocol: Yes   Plan:  -Continue heparin IV - titrate as needed -Follow ACT per protocol -Monitor CBC, S/Sx bleeding  Sheppard Coil PharmD., BCPS Clinical Pharmacist Pager [phone] 09/03/2017 7:41 AM

## 2017-09-03 NOTE — Progress Notes (Signed)
Dr. Dorris Fetch notified regarding CVP 4/5, levophed at 5 with a MAP uppers 60s. RVAD flows 4.5. Hemoglobin and Hematocrit 8.2 & 24 on CBC. K 3.5 after 6 runs of potassium today. Orders received for 6 more runs of K. Will implement orders and continue to monitor.

## 2017-09-03 NOTE — Progress Notes (Signed)
Pharmacy Antibiotic Note  Clinton Harrington is a 66 y.o. male admitted on 08/23/2017 with infection prophylaxis post-centrimag placement.  Pharmacy has been consulted for vancomycin and ceftazidime dosing.    WBC elevated at 13, afebrile, scr trending up now at 1.4.    Vancomycin trough previously at goal on 9/23. Recheck is elevated at 24. Patient appears to be accumulating so will adjust dose.  Plan: Reduce vancomycin to  IV q24 hours - start tonight to allow to clear Monitor for s/sx of infection, LOT, and check troughs as appropriate.   Ceftazidime 1gm IV q8h  Height:  (180.3 cm) Weight: 218 lb 11.1 oz (99.2 kg) IBW/kg (Calculated) : 75.3  Temp (24hrs), Avg:98.5 F (36.9 C), Min:98.1 F (36.7 C), Max:99.1 F (37.3 C)   Recent Labs Lab 08/31/17 0627  09/02/17 0025 09/02/17 0354 09/02/17 0805 09/02/17 1104 09/02/17 1601 09/02/17 1958 09/03/17 0255  WBC  --   < > 12.6* 14.8* 15.0*  --   --  14.1* 15.2*  CREATININE  --   < > 1.15 1.12  --  1.15 1.20  --  1.39*  VANCOTROUGH 16  --   --   --   --   --   --   --   --   < > = values in this interval not displayed.  Estimated Creatinine Clearance: 65.3 mL/min (A) (by C-G formula based on SCr of 1.39 mg/dL (H)).    Allergies  Allergen Reactions  . Aspirin Other (See Comments)    Stomach upset  . Latex Rash  . Oxycodone-Acetaminophen Itching   Antimicrobials this admission:  9/20 Vanc >> 9/20 Cefuroxime >> 9/22 9/22 Ceftaz >>  Microbiology results:  9/24 Bld x2: ngtd 9/24 UCx: ng 9/24 Sputum: ng 9/20 and 9/22 Sputum: neg  Dose adjustments: 9/23 Vancomycin trough 16 - continue 1g q12 hours 9/26 Vancomycin trough 24 - reduce to  q24 hours  Sheppard Coil PharmD., BCPS Clinical Pharmacist Pager [phone] 09/03/2017 7:55 AM

## 2017-09-03 NOTE — Progress Notes (Signed)
PULMONARY / CRITICAL CARE MEDICINE   Name: Clinton Harrington MRN: [id number] DOB: 01/12/1951    ADMISSION DATE:  08/23/2017 CONSULTATION DATE:  08/30/17  REFERRING MD: Dr Cornelius Moras  CHIEF COMPLAINT:  Acute resp failure, hypoxia, hypercapnia  brief:  Patient is a 66 y/o Caucasian M with hx CADs/p BMS to RCA in 1999 (due to an inferior STEMI), significant PVD, HTN, Type 2 DM (non-insulin dependent), HLP, Tobacco use comes in today complaining of substernal chest pain on admission.He continued to have active CP and the patient was taken to cath lab for further intervention. FOund with RV infacrct and required emergent bypass surgery. Remained in cardiogenic shock requiring balloon pump and placement of RVAD device. Remained on vent with acute resp failure, skin closed but sternum is not closed.He has developed worsening acidosis and hypoxia with bilateral int infiltrates. He has been pos 4 liters last 2 4 hours. He remains on levophed and milrinone. Asked to assist in critical care.  CULTURES: 9/22 sputum>>>ng 9/24>> Sputum>>ng 9/24 Blood>> 9/24 Urine>>ng  ANTIBIOTICS: zinacef >>>9/22- 9/24 ceftaz 9/22>>> Vanc>>>9/24>>  SIGNIFICANT EVENTS: 08/23/2017 - admit 9/22 - intubated Cath cabg RV assist device 9/23 - On epi/levo/RVAD/vent.  CI 1.8 despite all interventions 9/24>> Epi, Levo, milrinine,amio,insulin gtt, Lasix gtt, Heparin, Fent, Versed   SUBJECTIVE/OVERNIGHT/INTERVAL HX Remains sedated on fent/ versed drips RVAD flows down to 3200 Good UO on lasix gtt @ 15   VITAL SIGNS: BP (!) 117/48   Pulse (!) 102   Temp 98.2 F (36.8 C)   Resp (!) 24   Ht  (1.803 m)   Wt 218 lb 11.1 oz (99.2 kg)   SpO2 98%   BMI 30.50 kg/m   HEMODYNAMICS: PAP: (27-42)/(18-29) 27/18 CVP:  [10 mmHg-22 mmHg] 11 mmHg CO:  [4.5 L/min-5.9 L/min] 5 L/min CI:  [2.1 L/min/m2-2.7 L/min/m2] 2.3 L/min/m2  VENTILATOR SETTINGS: Vent Mode: PRVC FiO2 (%):  [40 %-50 %] 50 % Set Rate:  [20 bmp-24  bmp] 24 bmp Vt Set:  [600 mL] 600 mL PEEP:  [8 cmH20] 8 cmH20 Plateau Pressure:  [24 cmH20-31 cmH20] 24 cmH20  INTAKE / OUTPUT: I/O last 3 completed shifts: In: 78295 [I.V.:5746; Blood:617; NG/GT:2359; IV Piggyback:1803] Out: 10205 [Urine:7795; Emesis/NG output:1850; Chest Tube:560]  PHYSICAL EXAMINATION:   General Appearance:    critically ill OBESE  And pale  Head:    Normocephalic, atraumatic  Eyes:    PERRL , conjunctiva/corneas  Are clear     Ears:    Normal external ear canals, both ears, no drainage  Nose:   OG tube - Chor trac feeding tube  Throat:  ETT TUBE in good position , OG tube - yes  Neck:   Supple,  No LAD     Lungs:     Clear to auscultation bilaterally, , diminished per bases , vent synchrony  Chest wall:    Chest tubes , minimal drainage and RVAD  Heart:    S1 and S2 normal, no murmur,  Pressors - yes as above.  Abdomen:     Soft, no masses, no organomegaly, obese  Genitalia:    Not done  Rectal:   not done  Extremities:   Extremities- no cyanosis, no clubbing, mild edema, cool to touch     Skin:   Intact in exposed areas .no rash , lesions, no mottling noted     Neurologic:   RASS-3 on versed/ fent gtt   PULMONARY  Recent Labs Lab 09/02/17 0609 09/02/17 1119 09/02/17 1509 09/02/17 1601  09/02/17 1606 09/03/17 0317 09/03/17 0320  PHART 7.591* 7.422 7.350  --  7.432  --  7.371  PCO2ART 32.6 47.7 58.5*  --  46.3  --  54.9*  PO2ART 88.0 90.0 77.0*  --  94.0  --  72.1*  HCO3 31.5* 31.1* 32.3*  --  31.0*  --  31.0*  TCO2 32 33* 34* 30 32  --   --   O2SAT 98.0 97.0 94.0  --  98.0 70.5 93.8    CBC  Recent Labs Lab 09/02/17 1958  09/03/17 0255 09/03/17 0459 09/03/17 0800 09/03/17 1012  HGB 8.8*  < > 9.0* 8.2* 8.5* 8.5*  HCT 26.3*  < > 26.3* 24.0* 25.1* 25.0*  WBC 14.1*  --  15.2*  --  12.9*  --   PLT 145*  --  143*  --  148*  --   < > = values in this interval not displayed.  COAGULATION  Recent Labs Lab 08/31/17 0255 09/01/17 0351  09/02/17 0025 09/02/17 0354 09/03/17 0255  INR 1.50 1.49 1.18 1.19 1.11    CARDIAC  No results for input(s): TROPONINI in the last 168 hours. No results for input(s): PROBNP in the last 168 hours.   CHEMISTRY  Recent Labs Lab 08/28/17 2200 08/29/17 0228  08/29/17 1613  08/30/17 2000  09/01/17 0220  09/02/17 0025 09/02/17 0354 09/02/17 1104 09/02/17 1601 09/02/17 2202 09/03/17 0255 09/03/17 0459 09/03/17 1012  NA  --  133*  < >  --   < > 128*  < > 121*  < > 121* 121* 123* 123* 127* 126* 130* 131*  K  --  4.3  < >  --   < > 3.7  < > 3.7  < > 3.0* 3.1* 3.1* 3.2* 3.0* 4.0 4.1 3.4*  CL  --  98*  < >  --   < > 96*  < > 84*  < > 82* 82* 82* 79*  --  87*  --   --   CO2  --  29  --   --   < > 24  < > 27  --  --   --  29  --   --   GLUCOSE  --  99  < >  --   < > 156*  < > 391*  < > 303* 306* 417* 539* 451* 433* 406* 371*  BUN  --  18  < >  --   < > 19  < > 19  < > 25* 26* 28* 31*  --  35*  --   --   CREATININE 1.00 0.92  < > 1.08  < > 1.03  < > 1.19  < > 1.15 1.12 1.15 1.20  --  1.39*  --   --   CALCIUM  --  7.6*  --   --   < > 7.3*  < > 7.6*  --  7.6* 7.7* 7.8*  --   --  7.9*  --   --   MG 2.7* 2.5*  --  1.9  --  1.6*  --  1.5*  --   --   --   --   --   --   --   --   --   PHOS  --   --   --   --   --  2.7  --  1.7*  --   --   --   --   --   --   --   --   --   < > =  values in this interval not displayed. Estimated Creatinine Clearance: 65.3 mL/min (A) (by C-G formula based on SCr of 1.39 mg/dL (H)).   LIVER  Recent Labs Lab 08/30/17 0254 08/31/17 0255 09/01/17 0220 09/01/17 0351 09/02/17 0025 09/02/17 0354 09/03/17 0255  AST 31 39 45*  --   --  42* 45*  ALT 17 15* 17  --   --  16* 19  ALKPHOS 58 65 71  --   --  81 97  BILITOT 0.7 0.7 0.8  --   --  1.3* 0.9  PROT 5.3* 5.1* 5.1*  --   --  5.3* 5.6*  ALBUMIN 2.4* 2.1* 2.1*  --   --  2.1* 2.1*  INR 1.61 1.50  --  1.49 1.18 1.19 1.11     INFECTIOUS No results for input(s): LATICACIDVEN, PROCALCITON in the  last 168 hours.   ENDOCRINE CBG (last 3)   Recent Labs  09/03/17 0658 09/03/17 0801 09/03/17 0859  GLUCAP 417* 399* 384*         IMAGING x48h  -   highlighted in bold Dg Chest Port 1 View  Result Date: 09/03/2017 CLINICAL DATA:  Hypoxia EXAM: PORTABLE CHEST 1 VIEW COMPARISON:  September 02, 2017 FINDINGS: Endotracheal tube tip is 2.9 cm above the carina. Swan-Ganz catheter tip is in the main pulmonary outflow tract. Nasogastric tube tip and side port are below the diaphragm. There are chest tubes bilaterally as well as a mediastinal drain present. Temporary pacemaker wires are attached to the right heart. There is a cannula to the left and to the right of the heart, unchanged in position. No pneumothorax. There is a small left pleural effusion with atelectatic change in the left lower lobe. There is mild atelectasis in the right mid lung. Lungs elsewhere clear. Heart remains enlarged with pulmonary vascularity suggesting pulmonary venous hypertension. There is aortic atherosclerosis. IMPRESSION: Tube and catheter positions as described without evident pneumothorax. Small left pleural effusion with left lower lobe atelectasis. Mild right midlung atelectasis. Evidence of underlying pulmonary vascular congestion. There is aortic atherosclerosis. Aortic Atherosclerosis (ICD10-I70.0). Electronically Signed   By: Bretta Bang III M.D.   On: 09/03/2017 07:20   Dg Chest Port 1 View  Result Date: 09/02/2017 CLINICAL DATA:  Assess support tubes. EXAM: PORTABLE CHEST 1 VIEW COMPARISON:  New portable chest x-ray of September 02, 2017 FINDINGS: The lungs are adequately inflated. The interstitial markings are more conspicuous bilaterally today. Bilateral chest tubes are stable. There is no pneumothorax nor large pleural effusion. The cardiac silhouette is enlarged. There are post CABG changes. The pulmonary vascularity is engorged. The Swan-Ganz catheter tip projects in the proximal right pulmonary  outflow tract. The endotracheal tube tip lies 4 cm above the carina. The feeding tube and esophagogastric tube tips project below the inferior margin of the image. A mediastinal drain is present whose tip projects at the T3 level. A right-sided PICC line tip projects over the distal third of the SVC. Cannulae project over the heart to the right and left of midline. There is calcification in the wall of the aortic arch. IMPRESSION: CHF with slight interval increase in pulmonary edema. Improved left lower lobe atelectasis. No significant pleural effusion and no pneumothorax. The support tubes and devices are in reasonable position. Electronically Signed   By: [name]  [place] M.D.   On: 09/02/2017 07:25   Dg Chest Port 1 View  Result Date: 09/02/2017 CLINICAL DATA:  Support apparatus evaluation EXAM: PORTABLE CHEST 1 VIEW COMPARISON:  Chest radiograph 09/01/2017 FINDINGS: The endotracheal tube is in unchanged position at the level of the clavicular heads. Bilateral chest tubes and central mediastinal drain are unchanged. The tip of the pulmonary arterial catheter remains overlying the main pulmonary artery. Right-sided PICC line tip is at the cavoatrial junction. Cardiomegaly with calcific aortic atherosclerosis is unchanged. There is persistent mild to moderate pulmonary edema and consolidation of the left lower lobe. IMPRESSION: 1. Unchanged support apparatus. 2. Persistent left lower lobe consolidation and mild to moderate pulmonary edema. Electronically Signed   By: Deatra Robinson M.D.   On: 09/02/2017 00:46       '  ASSESSMENT / PLAN:  PULMONARY A: Acute resp failure,  Acute PULM edema,at risk ALI Open chest sternum   P:  Keep PEEP at 8, can lower in 24ha s pO2 improves RR to 24 but pH acceptable Not ready for SBTs   CARDIOVASCULAR A:  Rv failure, cardiogenic shock, s/p CABG from inf MI CHF/ edema per CXR RVAD RBBB per EKG 9/25  P:  Milrinone per cvts Levophed/Epi to MAP goals Dc  stress steroids RVADS per cards/cvts amio Lasix gtt   RENAL Lab Results  Component Value Date   CREATININE 1.39 (H) 09/03/2017   CREATININE 1.20 09/02/2017   CREATININE 1.15 09/02/2017    Recent Labs Lab 09/03/17 0255 09/03/17 0459 09/03/17 1012  K 4.0 4.1 3.4*        A:   Hypokalemia Hypomagnesemia Hypophosphatemia Hyponatremia   Intake/Output Summary (Last 24 hours) at 09/03/17 1156 Last data filed at 09/03/17 1100  Gross per 24 hour  Intake          6194.37 ml  Output             5945 ml  Net           249.37 ml   P:   lasix infusion increased to 15/h for equal balance Received tolvaptan x 1 Replete electrolytes as needed Avoid nephrotoxic medications Trend BMET   GASTROINTESTINAL A:   Nutrition P:   per CVTS ct TF   HEMATOLOGIC A:   VAD  P:  Anticoagulation per cvts   INFECTIOUS A:   R/o PNA Increased atelectasis per CXR 9/25 P:   ceftaz  vanc Can dc vanc if blood cx remains beg  ENDOCRINE CBG (last 3)   Recent Labs  09/03/17 0658 09/03/17 0801 09/03/17 0859  GLUCAP 417* 399* 384*     A:   Hyperglycemia,  High insulin needs on insulin gtt Relative adrenal insuff with cortisol 9.6 9/22 P:   cbg ssi Solu cortef to Q 12 >> this is making sugars worse  NEUROLOGIC A:   Vent dyschrony Agitated when not sedated, follows commands   P:   RASS goal: -2 fent gtt Versed gtt wua daily   FAMILY  - Updates: no family at bedside 9/26  - Inter-disciplinary family meet or Palliative Care meeting due by:   Per primary team  The patient is critically ill with multiple organ systems failure and requires high complexity decision making for assessment and support, frequent evaluation and titration of therapies, application of advanced monitoring technologies and extensive interpretation of multiple databases. Critical Care Time devoted to patient care services described in this note independent of APP time is 31 minutes.    Cyril Mourning MD. Tonny Bollman. [facility] Pager [phone] If no response call [phone]    09/03/2017 11:56 AM

## 2017-09-03 NOTE — Progress Notes (Signed)
6 Days Post-Op Procedure(s) (LRB): CORONARY ARTERY BYPASS GRAFTING (CABG) times four using left internal mammary artery and right greater saphenous leg vein using endoscope. (N/A) TRANSESOPHAGEAL ECHOCARDIOGRAM (TEE) (N/A) PLACEMENT OF CENTRIMAG VENTRICULAR ASSIST DEVICE (Right) Subjective: Stable night with excellent RVAD flows and no bleeding-indications of improving native RV function. Speed of VAD reduced from 3600-3200 RPM. Excellent urine output, chest x-ray still wet but improving. Hemoglobin stable 8.2 on IV amiodarone to maintain sinus rhythm. Responsive during wake-up assessment but otherwise comfortable on ventilator  Patient will need return to the OR for removal of RVAD cannulas and sternal closure. I do not anticipate this to occur tomorrow since he had a severe setback 36 hours ago requiring 25% increase in VAD speed to maintain adequate flow associated with significant increase in CVP indicating that his RV function is not ready for decannulation. Inotropic support at this time remains minimal to allow metabolic and mechanical recovery of RV function.  Objective: Vital signs in last 24 hours: Temp:  [97.9 F (36.6 C)-99.1 F (37.3 C)] 98.2 F (36.8 C) (09/26 1200) Pulse Rate:  [54-183] 120 (09/26 1200) Cardiac Rhythm: Atrial fibrillation (09/26 0800) Resp:  [0-26] 0 (09/26 1200) BP: (102-158)/(46-79) 130/66 (09/26 1200) SpO2:  [92 %-99 %] 98 % (09/26 1200) Arterial Line BP: (101-178)/(44-60) 130/50 (09/26 1200) FiO2 (%):  [40 %-50 %] 50 % (09/26 1146) Weight:  [218 lb 11.1 oz (99.2 kg)] 218 lb 11.1 oz (99.2 kg) (09/26 0400)  Hemodynamic parameters for last 24 hours: PAP: (27-42)/(18-29) 29/20 CVP:  [10 mmHg-22 mmHg] 10 mmHg CO:  [4.5 L/min-5.9 L/min] 5 L/min CI:  [2.1 L/min/m2-2.7 L/min/m2] 2.3 L/min/m2  Intake/Output from previous day: 09/25 0701 - 09/26 0700 In: 7081.6 [I.V.:4059.6; NG/GT:1719; IV Piggyback:1303] Out: 6230 [Urine:4920; Emesis/NG output:950; Chest  Tube:360] Intake/Output this shift: Total I/O In: 842 [I.V.:592; NG/GT:200; IV Piggyback:50] Out: 1100 [Urine:1000; Emesis/NG output:100]       Exam    General-  Comfortable on ventilator   Lungs- clear without rales, wheezes   Cor- regular rate and rhythm, no murmur , gallop   Abdomen- soft, non-tender   Extremities - warm, non-tender, minimal edema   Neuro- moves all extremities during wake-up assessment, no focal weakness   Lab Results:  Recent Labs  09/03/17 0255  09/03/17 0800 09/03/17 1012  WBC 15.2*  --  12.9*  --   HGB 9.0*  < > 8.5* 8.5*  HCT 26.3*  < > 25.1* 25.0*  PLT 143*  --  148*  --   < > = values in this interval not displayed. BMET:  Recent Labs  09/02/17 1104 09/02/17 1601  09/03/17 0255 09/03/17 0459 09/03/17 1012  NA 123* 123*  < > 126* 130* 131*  K 3.1* 3.2*  < > 4.0 4.1 3.4*  CL 82* 79*  --  87*  --   --   CO2 29  --   --  29  --   --   GLUCOSE 417* 539*  < > 433* 406* 371*  BUN 28* 31*  --  35*  --   --   CREATININE 1.15 1.20  --  1.39*  --   --   CALCIUM 7.8*  --   --  7.9*  --   --   < > = values in this interval not displayed.  PT/INR:  Recent Labs  09/03/17 0255  LABPROT 14.2  INR 1.11   ABG    Component Value Date/Time   PHART 7.371 09/03/2017 0320  HCO3 31.0 (H) 09/03/2017 0320   TCO2 32 09/02/2017 1606   ACIDBASEDEF 2.0 08/31/2017 1211   O2SAT 93.8 09/03/2017 0320   CBG (last 3)   Recent Labs  09/03/17 0801 09/03/17 0859 09/03/17 1058  GLUCAP 399* 384* 404*    Assessment/Plan: S/P Procedure(s) (LRB): CORONARY ARTERY BYPASS GRAFTING (CABG) times four using left internal mammary artery and right greater saphenous leg vein using endoscope. (N/A) TRANSESOPHAGEAL ECHOCARDIOGRAM (TEE) (N/A) PLACEMENT OF CENTRIMAG VENTRICULAR ASSIST DEVICE (Right) Diuresis Diabetes control Continue right ventricular VAD support  Continue sliding scale heparin based on ACT-maximum dose 2200 units per hour Return the OR for VAD  decannulation and sternal closure later this week or early next week  LOS: 11 days    Clinton Harrington 09/03/2017

## 2017-09-04 ENCOUNTER — Inpatient Hospital Stay (HOSPITAL_COMMUNITY): Payer: PPO

## 2017-09-04 LAB — POCT ACTIVATED CLOTTING TIME
ACTIVATED CLOTTING TIME: 125 s
ACTIVATED CLOTTING TIME: 136 s
ACTIVATED CLOTTING TIME: 142 s
ACTIVATED CLOTTING TIME: 158 s
ACTIVATED CLOTTING TIME: 158 s
ACTIVATED CLOTTING TIME: 158 s
ACTIVATED CLOTTING TIME: 158 s
ACTIVATED CLOTTING TIME: 158 s
Activated Clotting Time: 158 seconds
Activated Clotting Time: 131 seconds
Activated Clotting Time: 120 seconds
Activated Clotting Time: 131 seconds
Activated Clotting Time: 142 seconds

## 2017-09-04 LAB — CBC
HCT: 23.8 % — ABNORMAL LOW (ref 39.0–52.0)
HCT: 28.2 % — ABNORMAL LOW (ref 39.0–52.0)
HCT: 25.3 % — ABNORMAL LOW (ref 39.0–52.0)
Hemoglobin: 7.8 g/dL — ABNORMAL LOW (ref 13.0–17.0)
Hemoglobin: 9.5 g/dL — ABNORMAL LOW (ref 13.0–17.0)
Hemoglobin: 8.5 g/dL — ABNORMAL LOW (ref 13.0–17.0)
MCH: 28.5 pg (ref 26.0–34.0)
MCH: 29.2 pg (ref 26.0–34.0)
MCH: 29 pg (ref 26.0–34.0)
MCHC: 32.8 g/dL (ref 30.0–36.0)
MCHC: 33.7 g/dL (ref 30.0–36.0)
MCHC: 33.6 g/dL (ref 30.0–36.0)
MCV: 86.9 fL (ref 78.0–100.0)
MCV: 86.8 fL (ref 78.0–100.0)
MCV: 86.3 fL (ref 78.0–100.0)
Platelets: 186 10*3/uL (ref 150–400)
Platelets: 187 10*3/uL (ref 150–400)
Platelets: 194 10*3/uL (ref 150–400)
RBC: 2.74 MIL/uL — ABNORMAL LOW (ref 4.22–5.81)
RBC: 3.25 MIL/uL — ABNORMAL LOW (ref 4.22–5.81)
RBC: 2.93 MIL/uL — ABNORMAL LOW (ref 4.22–5.81)
RDW: 15 % (ref 11.5–15.5)
RDW: 15 % (ref 11.5–15.5)
RDW: 15.4 % (ref 11.5–15.5)
WBC: 19.2 10*3/uL — ABNORMAL HIGH (ref 4.0–10.5)
WBC: 20.1 10*3/uL — ABNORMAL HIGH (ref 4.0–10.5)
WBC: 19.5 10*3/uL — ABNORMAL HIGH (ref 4.0–10.5)

## 2017-09-04 LAB — POCT I-STAT, CHEM 8
BUN: 46 mg/dL — AB (ref 6–20)
CALCIUM ION: 1.21 mmol/L (ref 1.15–1.40)
CHLORIDE: 90 mmol/L — AB (ref 101–111)
Creatinine, Ser: 1.4 mg/dL — ABNORMAL HIGH (ref 0.61–1.24)
Glucose, Bld: 128 mg/dL — ABNORMAL HIGH (ref 65–99)
HCT: 25 % — ABNORMAL LOW (ref 39.0–52.0)
Hemoglobin: 8.5 g/dL — ABNORMAL LOW (ref 13.0–17.0)
Potassium: 4 mmol/L (ref 3.5–5.1)
SODIUM: 136 mmol/L (ref 135–145)
TCO2: 34 mmol/L — ABNORMAL HIGH (ref 22–32)

## 2017-09-04 LAB — PREPARE RBC (CROSSMATCH)

## 2017-09-04 LAB — COMPREHENSIVE METABOLIC PANEL
ALT: 20 U/L (ref 17–63)
AST: 41 U/L (ref 15–41)
Albumin: 1.9 g/dL — ABNORMAL LOW (ref 3.5–5.0)
Alkaline Phosphatase: 82 U/L (ref 38–126)
Anion gap: 9 (ref 5–15)
BUN: 47 mg/dL — ABNORMAL HIGH (ref 6–20)
CO2: 32 mmol/L (ref 22–32)
Calcium: 8.2 mg/dL — ABNORMAL LOW (ref 8.9–10.3)
Chloride: 92 mmol/L — ABNORMAL LOW (ref 101–111)
Creatinine, Ser: 1.46 mg/dL — ABNORMAL HIGH (ref 0.61–1.24)
GFR calc Af Amer: 57 mL/min — ABNORMAL LOW (ref >60)
GFR calc non Af Amer: 49 mL/min — ABNORMAL LOW (ref >60)
Glucose, Bld: 166 mg/dL — ABNORMAL HIGH (ref 65–99)
Potassium: 4.2 mmol/L (ref 3.5–5.1)
Sodium: 133 mmol/L — ABNORMAL LOW (ref 135–145)
Total Bilirubin: 0.7 mg/dL (ref 0.3–1.2)
Total Protein: 5.3 g/dL — ABNORMAL LOW (ref 6.5–8.1)

## 2017-09-04 LAB — GLUCOSE, CAPILLARY
GLUCOSE-CAPILLARY: 112 mg/dL — AB (ref 65–99)
GLUCOSE-CAPILLARY: 129 mg/dL — AB (ref 65–99)
GLUCOSE-CAPILLARY: 133 mg/dL — AB (ref 65–99)
GLUCOSE-CAPILLARY: 146 mg/dL — AB (ref 65–99)
GLUCOSE-CAPILLARY: 151 mg/dL — AB (ref 65–99)
GLUCOSE-CAPILLARY: 166 mg/dL — AB (ref 65–99)
GLUCOSE-CAPILLARY: 182 mg/dL — AB (ref 65–99)
GLUCOSE-CAPILLARY: 190 mg/dL — AB (ref 65–99)
GLUCOSE-CAPILLARY: 208 mg/dL — AB (ref 65–99)
GLUCOSE-CAPILLARY: 246 mg/dL — AB (ref 65–99)
GLUCOSE-CAPILLARY: 99 mg/dL (ref 65–99)
Glucose-Capillary: 199 mg/dL — ABNORMAL HIGH (ref 65–99)
Glucose-Capillary: 173 mg/dL — ABNORMAL HIGH (ref 65–99)
Glucose-Capillary: 164 mg/dL — ABNORMAL HIGH (ref 65–99)
Glucose-Capillary: 148 mg/dL — ABNORMAL HIGH (ref 65–99)
Glucose-Capillary: 157 mg/dL — ABNORMAL HIGH (ref 65–99)
Glucose-Capillary: 144 mg/dL — ABNORMAL HIGH (ref 65–99)
Glucose-Capillary: 115 mg/dL — ABNORMAL HIGH (ref 65–99)
Glucose-Capillary: 119 mg/dL — ABNORMAL HIGH (ref 65–99)
Glucose-Capillary: 156 mg/dL — ABNORMAL HIGH (ref 65–99)
Glucose-Capillary: 117 mg/dL — ABNORMAL HIGH (ref 65–99)
Glucose-Capillary: 121 mg/dL — ABNORMAL HIGH (ref 65–99)

## 2017-09-04 LAB — BLOOD GAS, ARTERIAL
Acid-Base Excess: 9.8 mmol/L — ABNORMAL HIGH (ref 0.0–2.0)
Bicarbonate: 34.3 mmol/L — ABNORMAL HIGH (ref 20.0–28.0)
FIO2: 50
MECHVT: 600 mL
O2 Saturation: 97.9 %
PEEP: 8 cmH2O
Patient temperature: 98.6
RATE: 24 resp/min
pCO2 arterial: 51.3 mmHg — ABNORMAL HIGH (ref 32.0–48.0)
pH, Arterial: 7.441 (ref 7.350–7.450)
pO2, Arterial: 108 mmHg (ref 83.0–108.0)

## 2017-09-04 LAB — CALCIUM, IONIZED: Calcium, Ionized, Serum: 4.4 mg/dL — ABNORMAL LOW (ref 4.5–5.6)

## 2017-09-04 LAB — POCT I-STAT 4, (NA,K, GLUC, HGB,HCT)
Glucose, Bld: 266 mg/dL — ABNORMAL HIGH (ref 65–99)
Glucose, Bld: 168 mg/dL — ABNORMAL HIGH (ref 65–99)
Glucose, Bld: 138 mg/dL — ABNORMAL HIGH (ref 65–99)
HCT: 23 % — ABNORMAL LOW (ref 39.0–52.0)
HEMATOCRIT: 21 % — AB (ref 39.0–52.0)
HEMATOCRIT: 26 % — AB (ref 39.0–52.0)
HEMOGLOBIN: 7.8 g/dL — AB (ref 13.0–17.0)
Hemoglobin: 7.1 g/dL — ABNORMAL LOW (ref 13.0–17.0)
Hemoglobin: 8.8 g/dL — ABNORMAL LOW (ref 13.0–17.0)
POTASSIUM: 3.5 mmol/L (ref 3.5–5.1)
Potassium: 4.1 mmol/L (ref 3.5–5.1)
Potassium: 4 mmol/L (ref 3.5–5.1)
SODIUM: 136 mmol/L (ref 135–145)
SODIUM: 136 mmol/L (ref 135–145)
Sodium: 136 mmol/L (ref 135–145)

## 2017-09-04 LAB — APTT: aPTT: 87 seconds — ABNORMAL HIGH (ref 24–36)

## 2017-09-04 LAB — COOXEMETRY PANEL
Carboxyhemoglobin: 1.4 % (ref 0.5–1.5)
Methemoglobin: 0.9 % (ref 0.0–1.5)
O2 Saturation: 70.8 %
Total hemoglobin: 7.9 g/dL — ABNORMAL LOW (ref 12.0–16.0)

## 2017-09-04 LAB — HEPARIN LEVEL (UNFRACTIONATED): HEPARIN UNFRACTIONATED: 0.52 [IU]/mL (ref 0.30–0.70)

## 2017-09-04 LAB — PROTIME-INR
INR: 1.15
Prothrombin Time: 14.6 seconds (ref 11.4–15.2)

## 2017-09-04 LAB — LACTATE DEHYDROGENASE: LDH: 437 U/L — ABNORMAL HIGH (ref 98–192)

## 2017-09-04 MED ORDER — HYDROCORTISONE NA SUCCINATE PF 100 MG IJ SOLR
50.0000 mg | Freq: Every day | INTRAMUSCULAR | Status: DC
  Administered 2017-09-04 – 2017-09-07 (×4): 50 mg via INTRAVENOUS
  Filled 2017-09-04 (×4): qty 2

## 2017-09-04 MED ORDER — POTASSIUM CHLORIDE 10 MEQ/50ML IV SOLN
INTRAVENOUS | Status: AC
  Administered 2017-09-04: 10 meq via INTRAVENOUS
  Filled 2017-09-04: qty 100

## 2017-09-04 MED ORDER — ALBUMIN HUMAN 5 % IV SOLN
12.5000 g | Freq: Once | INTRAVENOUS | Status: AC
  Filled 2017-09-04: qty 250

## 2017-09-04 MED ORDER — ALBUMIN HUMAN 25 % IV SOLN: 12.5000 g | Freq: Once | INTRAVENOUS | Status: DC

## 2017-09-04 MED ORDER — ALBUMIN HUMAN 5 % IV SOLN
INTRAVENOUS | Status: AC
  Filled 2017-09-04: qty 250

## 2017-09-04 MED ORDER — SODIUM CHLORIDE 0.9 % IV SOLN
Freq: Once | INTRAVENOUS | Status: AC
  Administered 2017-09-04: 05:00:00 via INTRAVENOUS

## 2017-09-04 MED ORDER — AMIODARONE IV BOLUS ONLY 150 MG/100ML
150.0000 mg | Freq: Once | INTRAVENOUS | Status: AC
  Administered 2017-09-04: 150 mg via INTRAVENOUS

## 2017-09-04 MED ORDER — ALBUMIN HUMAN 5 % IV SOLN
INTRAVENOUS | Status: AC
  Administered 2017-09-04: 12.5 g
  Filled 2017-09-04: qty 250

## 2017-09-04 MED ORDER — ALBUMIN HUMAN 5 % IV SOLN
12.5000 g | Freq: Once | INTRAVENOUS | Status: AC
  Administered 2017-09-04: 12.5 g via INTRAVENOUS

## 2017-09-04 MED ORDER — SODIUM CHLORIDE 0.9 % IV SOLN
Freq: Once | INTRAVENOUS | Status: AC
  Administered 2017-09-04: 09:00:00 via INTRAVENOUS

## 2017-09-04 MED ORDER — POTASSIUM CHLORIDE 10 MEQ/50ML IV SOLN
10.0000 meq | INTRAVENOUS | Status: AC
  Administered 2017-09-04: 10 meq via INTRAVENOUS

## 2017-09-04 MED ORDER — CALCIUM CHLORIDE 10 % IV SOLN
1.0000 g | Freq: Once | INTRAVENOUS | Status: AC
  Administered 2017-09-04: 1 g via INTRAVENOUS

## 2017-09-04 MED ORDER — DEXTROSE 5 % IV SOLN
5.0000 mg/h | INTRAVENOUS | Status: DC
  Administered 2017-09-04: 5 mg/h via INTRAVENOUS
  Filled 2017-09-04 (×2): qty 25

## 2017-09-04 NOTE — Progress Notes (Signed)
PULMONARY / CRITICAL CARE MEDICINE   Name: Clinton Harrington MRN: [id number] DOB: [birth year]    ADMISSION DATE:  08/23/2017 CONSULTATION DATE:  08/30/17  REFERRING MD: Dr Cornelius Moras  CHIEF COMPLAINT:  Acute resp failure, hypoxia, hypercapnia  brief:  Patient is a 66 y/o Caucasian M with hx CADs/p BMS to RCA in 1999 (due to an inferior STEMI), significant PVD, HTN, Type 2 DM (non-insulin dependent), HLP, Tobacco use comes in today complaining of substernal chest pain on admission.He continued to have active CP and the patient was taken to cath lab for further intervention. FOund with RV infacrct and required emergent bypass surgery. Remained in cardiogenic shock requiring balloon pump and placement of RVAD device. Remained on vent with acute resp failure, skin closed but sternum is not closed.He has developed worsening acidosis and hypoxia with bilateral int infiltrates. He has been pos 4 liters last 2 4 hours. He remains on levophed and milrinone. Asked to assist in critical care.  CULTURES: 9/22 sputum>>>ng 9/24>> Sputum>>ng 9/24 Blood>> 9/24 Urine>>ng  ANTIBIOTICS: zinacef >>>9/22- 9/24 ceftaz 9/22>>> Vanc>>>9/24>>  SIGNIFICANT EVENTS: 08/23/2017 - admit 9/22 - intubated Cath cabg RV assist device 9/23 - On epi/levo/RVAD/vent.  CI 1.8 despite all interventions 9/24>> Epi, Levo, milrinine,amio,insulin gtt, Lasix gtt, Heparin, Fent, Versed 9/26 - Remains sedated on fent/ versed drips RVAD flows down to 3200 Good UO on lasix gtt @ 15   SUBJECTIVE/OVERNIGHT/INTERVAL HX 9/27 - 40% fio2, peep 8. RPM on RVAD turned down. Stil on levophed, milrinone, epi ggtt and heparin.admio gtt. Making urine. Has chest draines. Good urine outoput. Heparin on hold briefly before resumptions due to bleed in groin site. Getting 2 UI PRBC   VITAL SIGNS: BP (!) 95/59   Pulse (!) 125   Temp 98.1 F (36.7 C)   Resp (!) 24   Ht  (1.803 m)   Wt 211 lb 3.2 oz (95.8 kg)   SpO2 98%   BMI 29.46 kg/m    HEMODYNAMICS: PAP: (18-41)/(14-31) 18/15 CVP:  [5 mmHg-26 mmHg] 7 mmHg CO:  [4.3 L/min-4.9 L/min] 4.3 L/min CI:  [2 L/min/m2-2.3 L/min/m2] 2 L/min/m2  VENTILATOR SETTINGS: Vent Mode: PRVC FiO2 (%):  [50 %] 50 % Set Rate:  [24 bmp] 24 bmp Vt Set:  [600 mL] 600 mL PEEP:  [8 cmH20] 8 cmH20 Plateau Pressure:  [8 cmH20-26 cmH20] 25 cmH20  INTAKE / OUTPUT: I/O last 3 completed shifts: In: 9190.6 [I.V.:5365.6; NG/GT:2525; IV Piggyback:1300] Out: 66440 [Urine:9475; Emesis/NG output:1650; Chest Tube:900]  PHYSICAL EXAMINATION:   General Appearance:    Looks criticall ill  Head:    Normocephalic, without obvious abnormality, atraumatic  Eyes:    PERRL - yes, conjunctiva/corneas - clear      Ears:    Normal external ear canals, both ears  Nose:   NG tube - cortrak +  Throat:  ETT TUBE - yes , OG tube - yes  Neck:   Supple,  No enlargement/tenderness/nodules     Lungs:     Clear to auscultation bilaterally, Ventilator   Synchrony - yes  Chest wall:    No deformity  Heart:    S1 and S2 normal, no murmur, CVP - na.  Pressors - yes  Abdomen:     Soft, no masses, no organomegaly  Genitalia:    Not done  Rectal:   not done  Extremities:   Extremities- mild edema     Skin:   Intact in exposed areas . Sacral area - no decub reported per  rn     Neurologic:   Sedation - fentanyl and versed gtt -> RASS - -3 . Moves all 4s - yes per rn. CAM-ICU - neg per rn . Orientation - follows commands per rN    PULMONARY  Recent Labs Lab 09/02/17 1119 09/02/17 1509 09/02/17 1601 09/02/17 1606 09/03/17 0317 09/03/17 0320 09/03/17 1551 09/04/17 0330 09/04/17 0340  PHART 7.422 7.350  --  7.432  --  7.371  --   --  7.441  PCO2ART 47.7 58.5*  --  46.3  --  54.9*  --   --  51.3*  PO2ART 90.0 77.0*  --  94.0  --  72.1*  --   --  108  HCO3 31.1* 32.3*  --  31.0*  --  31.0*  --   --  34.3*  TCO2 33* 34* 30 32  --   --  30  --   --   O2SAT 97.0 94.0  --  98.0 70.5 93.8  --  70.8 97.9     CBC  Recent Labs Lab 09/03/17 0800  09/03/17 1953 09/03/17 2055 09/04/17 0324 09/04/17 0454  HGB 8.5*  < > 8.2* 7.8* 7.8* 7.1*  HCT 25.1*  < > 24.8* 23.0* 23.8* 21.0*  WBC 12.9*  --  14.8*  --  19.2*  --   PLT 148*  --  167  --  186  --   < > = values in this interval not displayed.  COAGULATION  Recent Labs Lab 09/01/17 0351 09/02/17 0025 09/02/17 0354 09/03/17 0255 09/04/17 0324  INR 1.49 1.18 1.19 1.11 1.15    CARDIAC  No results for input(s): TROPONINI in the last 168 hours. No results for input(s): PROBNP in the last 168 hours.   CHEMISTRY  Recent Labs Lab 08/29/17 0228  08/29/17 1613  08/30/17 2000  09/01/17 0220  09/02/17 0025 09/02/17 0354 09/02/17 1104 09/02/17 1601  09/03/17 0255  09/03/17 0800 09/03/17 1012 09/03/17 1551 09/03/17 2055 09/04/17 0324 09/04/17 0454  NA 133*  < >  --   < > 128*  < > 121*  < > 121* 121* 123* 123*  < > 126*  < >  --  131* 131* 136 133* 136  K 4.3  < >  --   < > 3.7  < > 3.7  < > 3.0* 3.1* 3.1* 3.2*  < > 4.0  < >  --  3.4* 3.7 3.5 4.2 4.1  CL 98*  < >  --   < > 96*  < > 84*  < > 82* 82* 82* 79*  --  87*  --   --   --  87*  --  92*  --   CO2 29  --   --   < > 24  < > 27  --  29 28 29   --   --  29  --   --   --   --   --  32  --   GLUCOSE 99  < >  --   < > 156*  < > 391*  < > 303* 306* 417* 539*  < > 433*  < >  --  371* 413* 266* 166* 168*  BUN 18  < >  --   < > 19  < > 19  < > 25* 26* 28* 31*  --  35*  --   --   --  39*  --  47*  --  CREATININE 0.92  < > 1.08  < > 1.03  < > 1.19  < > 1.15 1.12 1.15 1.20  --  1.39*  --   --   --  1.30*  --  1.46*  --   CALCIUM 7.6*  --   --   < > 7.3*  < > 7.6*  --  7.6* 7.7* 7.8*  --   --  7.9*  --   --   --   --   --  8.2*  --   MG 2.5*  --  1.9  --  1.6*  --  1.5*  --   --   --   --   --   --   --   --  1.7  --   --   --   --   --   PHOS  --   --   --   --  2.7  --  1.7*  --   --   --   --   --   --   --   --   --   --   --   --   --   --   < > = values in this interval not  displayed. Estimated Creatinine Clearance: 61.2 mL/min (A) (by C-G formula based on SCr of 1.46 mg/dL (H)).   LIVER  Recent Labs Lab 08/31/17 0255 09/01/17 0220 09/01/17 0351 09/02/17 0025 09/02/17 0354 09/03/17 0255 09/04/17 0324  AST 39 45*  --   --  42* 45* 41  ALT 15* 17  --   --  16* 19 20  ALKPHOS 65 71  --   --  81 97 82  BILITOT 0.7 0.8  --   --  1.3* 0.9 0.7  PROT 5.1* 5.1*  --   --  5.3* 5.6* 5.3*  ALBUMIN 2.1* 2.1*  --   --  2.1* 2.1* 1.9*  INR 1.50  --  1.49 1.18 1.19 1.11 1.15     INFECTIOUS No results for input(s): LATICACIDVEN, PROCALCITON in the last 168 hours.   ENDOCRINE CBG (last 3)   Recent Labs  09/04/17 0555 09/04/17 0656 09/04/17 0757  GLUCAP 166* 164* 148*         IMAGING x48h  - image(s) personally visualized  -   highlighted in bold Dg Chest Port 1 View  Result Date: 09/04/2017 CLINICAL DATA:  Hypoxia EXAM: PORTABLE CHEST 1 VIEW COMPARISON:  September 03, 2017 FINDINGS: Endotracheal tube tip is 3.9 cm above the carina. Swan-Ganz catheter tip is in the main pulmonary outflow tract. There are chest tubes and mediastinal drain bilaterally. There is a cannula to the right and left of midline, stable. Nasogastric tube tip and side port are below the diaphragm. Temporary pacemaker wires are attached to the right heart. No pneumothorax. There is a small pleural effusion on the left with patchy atelectasis in the left lower lobe. Right lung is clear. There is cardiomegaly with pulmonary venous hypertension. There is aortic atherosclerosis. No bone lesions are evident. IMPRESSION: Tube and catheter positions as described without pneumothorax. Persistent left lower lobe atelectasis with small left pleural effusion. No new opacity evident. Stable cardiac silhouette. There is aortic atherosclerosis. Aortic Atherosclerosis (ICD10-I70.0). Electronically Signed   By: Bretta Bang III M.D.   On: 09/04/2017 07:57   Dg Chest Port 1 View  Result Date:  09/03/2017 CLINICAL DATA:  Hypoxia EXAM: PORTABLE CHEST 1 VIEW COMPARISON:  September 02, 2017 FINDINGS:  Endotracheal tube tip is 2.9 cm above the carina. Swan-Ganz catheter tip is in the main pulmonary outflow tract. Nasogastric tube tip and side port are below the diaphragm. There are chest tubes bilaterally as well as a mediastinal drain present. Temporary pacemaker wires are attached to the right heart. There is a cannula to the left and to the right of the heart, unchanged in position. No pneumothorax. There is a small left pleural effusion with atelectatic change in the left lower lobe. There is mild atelectasis in the right mid lung. Lungs elsewhere clear. Heart remains enlarged with pulmonary vascularity suggesting pulmonary venous hypertension. There is aortic atherosclerosis. IMPRESSION: Tube and catheter positions as described without evident pneumothorax. Small left pleural effusion with left lower lobe atelectasis. Mild right midlung atelectasis. Evidence of underlying pulmonary vascular congestion. There is aortic atherosclerosis. Aortic Atherosclerosis (ICD10-I70.0). Electronically Signed   By: Bretta Bang III M.D.   On: 09/03/2017 07:20   ASSESSMENT / PLAN:  PULMONARY A: 09/04/2017 -> acute resp failure due to cardiogenic shock  P:   Full vent support. WEan peeep/fio2 as able Lasix gtt  CARDIOVASCULAR A:   09/04/2017 -> cardiogenic shock  P:  Per cards and cvts  RENAL  Intake/Output Summary (Last 24 hours) at 09/04/17 0924 Last data filed at 09/04/17 0910  Gross per 24 hour  Intake           6155.8 ml  Output             9245 ml  Net          -3089.2 ml    Recent Labs Lab 09/02/17 1104 09/02/17 1601 09/03/17 0255 09/03/17 1551 09/04/17 0324  CREATININE 1.15 1.20 1.39* 1.30* 1.46*     A:    09/04/2017 -> Mil AKI with lasix gtt  P:   Monitor; might need to back off lasix gtt  GASTROINTESTINAL A:   Tolerating TF  P:    TF PPI  HEMATOLOGIC  Recent Labs Lab 09/03/17 0800  09/03/17 1953 09/03/17 2055 09/04/17 0324 09/04/17 0454  HGB 8.5*  < > 8.2* 7.8* 7.8* 7.1*  HCT 25.1*  < > 24.8* 23.0* 23.8* 21.0*  WBC 12.9*  --  14.8*  --  19.2*  --   PLT 148*  --  167  --  186  --   < > = values in this interval not displayed.  A:   #RBC: anemia criticall illness #Platelet *normal #WBC hight  P:  - PRBC for hgb </= 8-8.5gm%      INFECTIOUS No results for input(s): PROCALCITON in the last 168 hours.  Results for orders placed or performed during the hospital encounter of 08/23/17  MRSA PCR Screening     Status: None   Collection Time: 08/23/17  9:00 PM  Result Value Ref Range Status   MRSA by PCR NEGATIVE NEGATIVE Final    Comment:        The GeneXpert MRSA Assay (FDA approved for NASAL specimens only), is one component of a comprehensive MRSA colonization surveillance program. It is not intended to diagnose MRSA infection nor to guide or monitor treatment for MRSA infections.   Surgical pcr screen     Status: Abnormal   Collection Time: 08/24/17  9:54 AM  Result Value Ref Range Status   MRSA, PCR NEGATIVE NEGATIVE Final   Staphylococcus aureus POSITIVE (A) NEGATIVE Final    Comment: (NOTE) The Xpert SA Assay (FDA approved for NASAL specimens in patients 22 years of  age and older), is one component of a comprehensive surveillance program. It is not intended to diagnose infection nor to guide or monitor treatment.   Culture, respiratory (NON-Expectorated)     Status: None   Collection Time: 08/28/17  3:42 PM  Result Value Ref Range Status   Specimen Description TRACHEAL ASPIRATE  Final   Special Requests NONE  Final   Gram Stain   Final    RARE SQUAMOUS EPITHELIAL CELLS PRESENT NO WBC SEEN FEW GRAM POSITIVE COCCI    Culture Consistent with normal respiratory flora.  Final   Report Status 08/29/2017 FINAL  Final  Culture, respiratory (NON-Expectorated)     Status: None    Collection Time: 08/30/17 11:27 AM  Result Value Ref Range Status   Specimen Description TRACHEAL ASPIRATE  Final   Special Requests NONE  Final   Gram Stain NO WBC SEEN NO ORGANISMS SEEN   Final   Culture Consistent with normal respiratory flora.  Final   Report Status 09/01/2017 FINAL  Final  Urine Culture     Status: None   Collection Time: 09/01/17  8:56 AM  Result Value Ref Range Status   Specimen Description URINE, CATHETERIZED  Final   Special Requests Normal  Final   Culture NO GROWTH  Final   Report Status 09/02/2017 FINAL  Final  Culture, respiratory (NON-Expectorated)     Status: None   Collection Time: 09/01/17 11:40 AM  Result Value Ref Range Status   Specimen Description TRACHEAL ASPIRATE  Final   Special Requests Normal  Final   Gram Stain   Final    ABUNDANT WBC PRESENT,BOTH PMN AND MONONUCLEAR NO ORGANISMS SEEN    Culture Consistent with normal respiratory flora.  Final   Report Status 09/03/2017 FINAL  Final  Culture, blood (Routine X 2) w Reflex to ID Panel     Status: None (Preliminary result)   Collection Time: 09/01/17  3:55 PM  Result Value Ref Range Status   Specimen Description BLOOD LEFT HAND  Final   Special Requests   Final    BOTTLES DRAWN AEROBIC ONLY Blood Culture adequate volume   Culture NO GROWTH 2 DAYS  Final   Report Status PENDING  Incomplete  Culture, blood (Routine X 2) w Reflex to ID Panel     Status: None (Preliminary result)   Collection Time: 09/01/17  3:55 PM  Result Value Ref Range Status   Specimen Description BLOOD LEFT HAND  Final   Special Requests IN PEDIATRIC BOTTLE Blood Culture adequate volume  Final   Culture NO GROWTH 2 DAYS  Final   Report Status PENDING  Incomplete    A:   Nil acute P:   Anti-infectives    Start     Dose/Rate Route Frequency Ordered Stop   09/03/17 1800  vancomycin (VANCOCIN) 1,500 mg in sodium chloride 0.9 % 250 mL IVPB     1,500 mg 125 mL/hr over 120 Minutes Intravenous Every 24 hours  09/03/17 0943     09/02/17 2200  vancomycin (VANCOCIN) 1,000 mg in sodium chloride 0.9 % 100 mL IVPB  Status:  Discontinued     1,000 mg 100 mL/hr over 60 Minutes Intravenous Every 12 hours 09/02/17 1323 09/03/17 0942   09/01/17 2200  vancomycin (VANCOCIN) IVPB 1000 mg/200 mL premix  Status:  Discontinued     1,000 mg 200 mL/hr over 60 Minutes Intravenous Every 12 hours 09/01/17 1118 09/01/17 1118   09/01/17 2200  vancomycin (VANCOCIN) 1,000 mg in sodium chloride  0.9 % 250 mL IVPB  Status:  Discontinued     1,000 mg 250 mL/hr over 60 Minutes Intravenous Every 12 hours 09/01/17 1122 09/01/17 1454   09/01/17 2200  vancomycin (VANCOCIN) IVPB 1000 mg/200 mL premix  Status:  Discontinued     1,000 mg 200 mL/hr over 60 Minutes Intravenous Every 12 hours 09/01/17 1454 09/02/17 1323   08/30/17 1400  cefTAZidime (FORTAZ) 1 g in dextrose 5 % 50 mL IVPB     1 g 100 mL/hr over 30 Minutes Intravenous Every 8 hours 08/30/17 1036     08/29/17 0830  vancomycin (VANCOCIN) IVPB 1000 mg/200 mL premix  Status:  Discontinued     1,000 mg 200 mL/hr over 60 Minutes Intravenous Every 12 hours 08/28/17 1545 09/01/17 1117   08/28/17 2030  vancomycin (VANCOCIN) IVPB 1000 mg/200 mL premix     1,000 mg 200 mL/hr over 60 Minutes Intravenous  Once 08/28/17 1502 08/28/17 2300   08/28/17 2000  cefUROXime (ZINACEF) 1.5 g in dextrose 5 % 50 mL IVPB  Status:  Discontinued     1.5 g 100 mL/hr over 30 Minutes Intravenous Every 12 hours 08/28/17 1502 08/30/17 1036   08/28/17 0400  vancomycin (VANCOCIN) 1,500 mg in sodium chloride 0.9 % 250 mL IVPB     1,500 mg 125 mL/hr over 120 Minutes Intravenous To Surgery 08/27/17 1000 08/28/17 1010   08/28/17 0400  cefUROXime (ZINACEF) 1.5 g in dextrose 5 % 50 mL IVPB     1.5 g 100 mL/hr over 30 Minutes Intravenous To Surgery 08/27/17 1000 08/28/17 1230   08/28/17 0400  cefUROXime (ZINACEF) 750 mg in dextrose 5 % 50 mL IVPB  Status:  Discontinued     750 mg 100 mL/hr over 30 Minutes  Intravenous To Surgery 08/27/17 1000 08/28/17 1500       ENDOCRINE A:   hyperglyucemia improved after reducing stress dose steroids P:   ICU hyperglycemia protocol Reduce hydrocort to daily  NEUROLOGIC A:   #Current: follows commands on wua P:   RASS goal: 0 to -3 fent and versed gtt to contineu   FAMILY  - Updates: 09/04/2017 --> no family at bedside  - Inter-disciplinary family meet or Palliative Care meeting due by:  DAy 7. Current LOS is LOS 12 days   DISPO Keep in ICU    The patient is critically ill with multiple organ systems failure and requires high complexity decision making for assessment and support, frequent evaluation and titration of therapies, application of advanced monitoring technologies and extensive interpretation of multiple databases.   Critical Care Time devoted to patient care services described in this note is  30  Minutes. This time reflects time of care of this signee Dr Kalman Shan. This critical care time does not reflect procedure time, or teaching time or supervisory time of PA/NP/Med student/Med Resident etc but could involve care discussion time    Dr. Kalman Shan, M.D., [facility].C.P Pulmonary and Critical Care Medicine Staff Physician [facility] Pager: [phone], If no answer or between  15:00h - 7:00h: call [phone]  09/04/2017 9:24 AM

## 2017-09-04 NOTE — Progress Notes (Addendum)
Patient ID: Clinton Harrington, male   DOB: 1951/01/05, 66 y.o.   MRN: [id number]     Advanced Heart Failure Rounding Note   Subjective:    Patient remains intubated and sedated.  IABP removed 9/24.  Centrimag RVAD remains in place.  Remains on Epi 3, milrionone 0.2, Norepi 5, and  amio 30 mg/hr.   Na 136 this am. -7.3L UO and negative 3.5 L total. Weight down another 7 lbs.   He remains in atrial fibrillation, HR in 100s-110s this morning.   Co-ox 70.38% this am.  Clinton Harrington numbers: CVP 8-9 PA 22/17 CO 4.3 CI 2.0  Centrimag  3200 rpm Flow 4.45 L/min  Echo (09/01/17): EF 65-70% with moderate LV hypertrophy, mildly dilated RV with moderately decreased systolic function, IVC dilated.   Objective:   Weight Range: 211 lb 3.2 oz (95.8 kg) Body mass index is 29.46 kg/m.   Vital Signs:   Temp:  [97.9 F (36.6 C)-99 F (37.2 C)] 98.1 F (36.7 C) (09/27 0700) Pulse Rate:  [93-126] 125 (09/27 0700) Resp:  [0-35] 21 (09/27 0700) BP: (83-158)/(42-81) 98/60 (09/27 0700) SpO2:  [92 %-99 %] 99 % (09/27 0700) Arterial Line BP: (79-168)/(38-82) 107/52 (09/27 0700) FiO2 (%):  [50 %] 50 % (09/27 0600) Weight:  [211 lb 3.2 oz (95.8 kg)] 211 lb 3.2 oz (95.8 kg) (09/27 0500) Last BM Date: 08/30/17  Weight change: Filed Weights   09/02/17 0400 09/03/17 0400 09/04/17 0500  Weight: 218 lb 11.1 oz (99.2 kg) 218 lb 11.1 oz (99.2 kg) 211 lb 3.2 oz (95.8 kg)    Intake/Output:   Intake/Output Summary (Last 24 hours) at 09/04/17 0725 Last data filed at 09/04/17 0700  Gross per 24 hour  Intake          5702.16 ml  Output             9220 ml  Net         -3517.84 ml      Physical Exam    General: Intubated/sedated.  HEENT: + ETT Neck: Supple. JVP ~9-10. Carotids 2+ bilat; no bruits. No thyromegaly or nodule noted. Cor: PMI nondisplaced. Irregular, tachy, No M/G/R noted Lungs: Diminished basilar sounds Abdomen: Soft, non-tender, non-distended, no HSM. No bruits or masses. +BS   Extremities: No cyanosis, clubbing, or rash. Trace ankle edema.  Neuro: Intubated/sedated  Telemetry   Atypical atrial flutter, rate 120. Personally reviewed.   EKG    9/25 (personally reviewed): Atypical flutter, RBBB  Labs    CBC  Recent Labs  09/03/17 1953  09/04/17 0324 09/04/17 0454  WBC 14.8*  --  19.2*  --   HGB 8.2*  < > 7.8* 7.1*  HCT 24.8*  < > 23.8* 21.0*  MCV 86.1  --  86.9  --   PLT 167  --  186  --   < > = values in this interval not displayed. Basic Metabolic Panel  Recent Labs  09/03/17 0255  09/03/17 0800  09/03/17 1551  09/04/17 0324 09/04/17 0454  NA 126*  < >  --   < > 131*  < > 133* 136  K 4.0  < >  --   < > 3.7  < > 4.2 4.1  CL 87*  --   --   --  87*  --  92*  --   CO2 29  --   --   --   --   --  32  --   GLUCOSE 433*  < >  --   < >  413*  < > 166* 168*  BUN 35*  --   --   --  39*  --  47*  --   CREATININE 1.39*  --   --   --  1.30*  --  1.46*  --   CALCIUM 7.9*  --   --   --   --   --  8.2*  --   MG  --   --  1.7  --   --   --   --   --   < > = values in this interval not displayed. Liver Function Tests  Recent Labs  09/03/17 0255 09/04/17 0324  AST 45* 41  ALT 19 20  ALKPHOS 97 82  BILITOT 0.9 0.7  PROT 5.6* 5.3*  ALBUMIN 2.1* 1.9*   No results for input(s): LIPASE, AMYLASE in the last 72 hours. Cardiac Enzymes No results for input(s): CKTOTAL, CKMB, CKMBINDEX, TROPONINI in the last 72 hours.  BNP: BNP (last 3 results)  Recent Labs  08/23/17 1927  BNP 200.0*    ProBNP (last 3 results) No results for input(s): PROBNP in the last 8760 hours.   D-Dimer No results for input(s): DDIMER in the last 72 hours. Hemoglobin A1C No results for input(s): HGBA1C in the last 72 hours. Fasting Lipid Panel No results for input(s): CHOL, HDL, LDLCALC, TRIG, CHOLHDL, LDLDIRECT in the last 72 hours. Thyroid Function Tests No results for input(s): TSH, T4TOTAL, T3FREE, THYROIDAB in the last 72 hours.  Invalid input(s):  FREET3  Other results:   Imaging    No results found.   Medications:     Scheduled Medications: . aspirin EC  325 mg Oral Daily   Or  . aspirin  324 mg Per Tube Daily  . atorvastatin  80 mg Per Tube q1800  . brimonidine  1 drop Right Eye BID  . chlorhexidine gluconate (MEDLINE KIT)  15 mL Mouth Rinse BID  . Chlorhexidine Gluconate Cloth  6 each Topical Q0600  . digoxin  0.125 mg Oral Daily  . feeding supplement (PRO-STAT SUGAR FREE 64)  30 mL Per Tube TID  . hydrocortisone sod succinate (SOLU-CORTEF) inj  50 mg Intravenous Q12H  . insulin detemir  55 Units Subcutaneous BID  . mouth rinse  15 mL Mouth Rinse 10 times per day  . mupirocin ointment  1 application Topical BID  . pantoprazole (PROTONIX) IV  40 mg Intravenous Q12H  . sodium chloride flush  10-40 mL Intracatheter Q12H  . sodium chloride flush  3 mL Intravenous Q12H    Infusions: . sodium chloride    . sodium chloride 10 mL (09/01/17 0800)  . sodium chloride 10 mL/hr at 09/03/17 2000  . sodium chloride 10 mL/hr at 09/03/17 2000  . amiodarone 30 mg/hr (09/04/17 0515)  . cefTAZidime (FORTAZ)  IV Stopped (09/04/17 0545)  . EPINEPHrine 4 mg in dextrose 5% 250 mL infusion (16 mcg/mL) 3 mcg/min (09/04/17 0550)  . feeding supplement (VITAL 1.5 CAL) 1,000 mL (09/04/17 0525)  . fentaNYL infusion INTRAVENOUS 400 mcg/hr (09/04/17 0338)  . furosemide (LASIX) infusion 15 mg/hr (09/03/17 2324)  . heparin Stopped (09/04/17 0500)  . insulin (NOVOLIN-R) infusion 25 mL/hr at 09/04/17 0700  . midazolam (VERSED) infusion 4 mg/hr (09/04/17 0056)  . milrinone 0.2 mcg/kg/min (09/03/17 2055)  . norepinephrine (LEVOPHED) Adult infusion 5 mcg/min (09/03/17 2000)  . vancomycin Stopped (09/03/17 1930)  . vasopressin (PITRESSIN) infusion - *FOR SHOCK* Stopped (09/02/17 0730)    PRN Medications: acetaminophen (TYLENOL)  oral liquid 160 mg/5 mL, bisacodyl, fentaNYL, fentaNYL (SUBLIMAZE) injection, metoprolol tartrate, midazolam,  midazolam, ondansetron (ZOFRAN) IV, sodium chloride flush, sodium chloride flush    Patient Profile   [name] is a 66 year old with a history of DMII, smoker, CAD S/P PCI RCA 20 years ago, HTN, PAD, and hyperlipidemia admitted with CP and inferior MI with RV infarction.  He underwent LHC with IABP. After optimized, underwent CABG x4 centrimag for RV infarct and continued IABP.   Assessment/Plan   1. Cardiogenic shock: Primarily RV failure from inferior infarct with RV involvement.  Repeat echo on 9/24 showed vigorous LV systolic function with moderate RV dysfunction.  Now s/p CABG and Centrimag for RV support.  IABP removed 9/24 with good LV function.   - Centrimag support, at 3200 rpm with flow at 4.45 L/min.  CI 2.0 earlier this am with coox of 70.8% - Continue milrionone 0.2 mcg/kg/min continue lasix 15 mg/hr for now.  - CVP 8-9 this am. Oozing from centrimag site.  - Continue digoxin for rate control and RV support.    - Wean norepi as tolerated today. On Epi at 3. Plan to wean Norepi first.  - Slow wean of RVAD 2. CAD: Inferior MI with RV involvement at presentation, now s/p CABG.  He is getting ASA.   - Continue statin.No change. 3. Atrial fibrillation/flutter:  - Rate in 110s today. Continue amiodarone gtt. 4. AKI:  - Creatinine slowly rising. 1.46 this am. Continue to follow closely.  5. Diabetes: Difficult to control, appreciate help from diabetes coordinator. No change.  6. Hyponatremia: 136 this am after several doses of tolvaptan. 7. ID: Covering for HCAP with vancomycin/ceftazidime. No change.  8. Anemia - Hgb 7.1 this am with oozing from centrimag site. Received 1 u PRBC earlier this am and heparin held x 2 hours.  - Will give additional 1 UPRBC.  Shirley Friar, PA-C  09/04/2017, 7:25 AM  Advanced Heart Failure Team Pager [phone] (M-F; 7a - 4p)  Please contact [facility] for night-coverage after hours (4p -7a ) and weekends on [email or web address]  Patient seen  with PA, agree with the above note.  I made adjustments to the note to reflect my thoughts and performed a full exam.   RVAD speed decreased yesterday pm to 3200 rpm, will decrease to 2800 rpm today.  Flow 3.9 L/min currently.  He remains on norepinephrine 4, epinephrine 3, and milrinone 0.2.  - Will try to wean off norepinephrine today.    CVP 8-9, weight down with good diuresis yesterday.  Sodium back to 136 so will not use tolvaptan today.  With RV failure, will try not to drop CVP much more (aim for even to mildly negative fluid balance today).  Decrease Lasix to 12 mg/hr today and can back off to 10 mg/hr if needed to get off norepinephrine.    Co-ox 70% today, adequate.   Patient is in atypical atrial flutter today, rate is around 120.  Continue amiodarone.   CRITICAL CARE Performed by: Loralie Champagne  Total critical care time: 35 minutes  Critical care time was exclusive of separately billable procedures and treating other patients.  Critical care was necessary to treat or prevent imminent or life-threatening deterioration.  Critical care was time spent personally by me on the following activities: development of treatment plan with patient and/or surrogate as well as nursing, discussions with consultants, evaluation of patient's response to treatment, examination of patient, obtaining history from patient or surrogate, ordering and  performing treatments and interventions, ordering and review of laboratory studies, ordering and review of radiographic studies, pulse oximetry and re-evaluation of patient's condition.  Loralie Champagne 09/04/2017 8:46 AM

## 2017-09-04 NOTE — Progress Notes (Signed)
EKG CRITICAL VALUE     12 lead EKG performed.  Critical value noted.  Dr. Donata Clay and Dessa Phi, RN notified.   Clinton Harrington, CCT 09/04/2017 8:26 AM

## 2017-09-04 NOTE — Progress Notes (Signed)
7 Days Post-Op Procedure(s) (LRB): CORONARY ARTERY BYPASS GRAFTING (CABG) times four using left internal mammary artery and right greater saphenous leg vein using endoscope. (N/A) TRANSESOPHAGEAL ECHOCARDIOGRAM (TEE) (N/A) PLACEMENT OF CENTRIMAG VENTRICULAR ASSIST DEVICE (Right) Subjective: Patient sustained his second major bleeding episode on heparin for his right ventricular assist device when ACT was therapeutic but heparin dose greater than 2000 units per hour. Will reduce heparin limit to 1500 units per hour to avoid major bleeding. Hemodynamics 7 stable and RV function appears to be slowly improving. Speed onRVAD  reduced by 400 RPM today  Otherwise patient is responsive during wake-up assessment and is receiving adequate nutrition with tube feeds  Objective: Vital signs in last 24 hours: Temp:  [97.9 F (36.6 C)-99 F (37.2 C)] 99 F (37.2 C) (09/27 1800) Pulse Rate:  [103-128] 115 (09/27 1800) Cardiac Rhythm: Atrial flutter (09/27 1800) Resp:  [0-35] 24 (09/27 1800) BP: (78-125)/(50-81) 111/59 (09/27 1800) SpO2:  [95 %-99 %] 97 % (09/27 1800) Arterial Line BP: (79-135)/(38-82) 135/52 (09/27 1800) FiO2 (%):  [40 %-50 %] 40 % (09/27 1800) Weight:  [211 lb 3.2 oz (95.8 kg)] 211 lb 3.2 oz (95.8 kg) (09/27 0500)  Hemodynamic parameters for last 24 hours: PAP: (18-41)/(14-31) 23/18 CVP:  [5 mmHg-26 mmHg] 8 mmHg CO:  [4.3 L/min-4.8 L/min] 4.3 L/min CI:  [2 L/min/m2-2.2 L/min/m2] 2 L/min/m2  Intake/Output from previous day: 09/26 0701 - 09/27 0700 In: 5702.2 [I.V.:3332.2; NG/GT:1570; IV Piggyback:800] Out: 9220 [Urine:7300; Emesis/NG output:1200; Chest Tube:720] Intake/Output this shift: Total I/O In: 2699.1 [I.V.:1217.5; Blood:661.7; NG/GT:820] Out: 1610 [RUEAV:4098; Emesis/NG output:650; Chest Tube:120]       Exam    General- alert and comfortable   Lungs- clear without rales, wheezes   Cor- regular rate and rhythm, no murmur , gallop   Abdomen- soft, non-tender  Extremities - warm, non-tender, minimal edema   Neuro- oriented, appropriate, no focal weakness   Lab Results:  Recent Labs  09/04/17 0324  09/04/17 1156 09/04/17 1627  WBC 19.2*  --  20.1*  --   HGB 7.8*  < > 9.5* 8.5*  HCT 23.8*  < > 28.2* 25.0*  PLT 186  --  187  --   < > = values in this interval not displayed. BMET:  Recent Labs  09/03/17 0255  09/04/17 0324  09/04/17 1015 09/04/17 1627  NA 126*  < > 133*  < > 136 136  K 4.0  < > 4.2  < > 4.0 4.0  CL 87*  < > 92*  --   --  90*  CO2 29  --  32  --   --   --   GLUCOSE 433*  < > 166*  < > 138* 128*  BUN 35*  < > 47*  --   --  46*  CREATININE 1.39*  < > 1.46*  --   --  1.40*  CALCIUM 7.9*  --  8.2*  --   --   --   < > = values in this interval not displayed.  PT/INR:  Recent Labs  09/04/17 0324  LABPROT 14.6  INR 1.15   ABG    Component Value Date/Time   PHART 7.441 09/04/2017 0340   HCO3 34.3 (H) 09/04/2017 0340   TCO2 34 (H) 09/04/2017 1627   ACIDBASEDEF 2.0 08/31/2017 1211   O2SAT 97.9 09/04/2017 0340   CBG (last 3)   Recent Labs  09/04/17 1602 09/04/17 1717 09/04/17 1802  GLUCAP 129* 156* 151*  Assessment/Plan: S/P Procedure(s) (LRB): CORONARY ARTERY BYPASS GRAFTING (CABG) times four using left internal mammary artery and right greater saphenous leg vein using endoscope. (N/A) TRANSESOPHAGEAL ECHOCARDIOGRAM (TEE) (N/A) PLACEMENT OF CENTRIMAG VENTRICULAR ASSIST DEVICE (Right) Continue slow wean of right ventricular assist device Plan removal of cannulas on Monday and hopefully closure of sternum    LOS: 12 days    Clinton Harrington 09/04/2017

## 2017-09-04 NOTE — Progress Notes (Signed)
Dr. Donata Clay paged regarding patient's CVP remaining between 3-5. No chatter in CMAG lines. Orders received for 1 5% Albumin, decrease Lasix drip to 5, 2 Runs of KCL, and  Amio bolus. Orders placed and communicated to oncoming RN.

## 2017-09-04 NOTE — Progress Notes (Signed)
Dr. Donata Clay called and made aware of patient's CVP 5-6 and new chatter in CMAG lines. Order received to hold Lasix drip until 1400 and give one albumin with one amp of Calcium Chloride. Orders carried out. Will continue to monitor pt closely.

## 2017-09-04 NOTE — Progress Notes (Signed)
Pt's right groin cannulation site continually oozing despite manual pressure held. Dr. Dorris Fetch notified, labs and vitals reviewed. Orders received to infuse 1 u PRBC and to hold heparin infusion for two hours. Will implement and continue to monitor pt closely.

## 2017-09-04 NOTE — Progress Notes (Signed)
Patient ID: Clinton Harrington, male   DOB: [birth year], 66 y.o.   MRN: [id number]  SICU Evening Rounds  He is hemodynamically stable on epi 2, milrinone 0.2 and levophed 2. CVP 6  PA 20/15, RVAD at 2800 with flow 3.8 L/min.  Diuresing well on lasix drip.  Rhythm remains atrial flutter on IV amio. Rate 100-120.  Sedated on vent.  Will continue current path for now.

## 2017-09-05 ENCOUNTER — Inpatient Hospital Stay (HOSPITAL_COMMUNITY): Payer: PPO

## 2017-09-05 DIAGNOSIS — I2102 ST elevation (STEMI) myocardial infarction involving left anterior descending coronary artery: Secondary | ICD-10-CM

## 2017-09-05 DIAGNOSIS — I2510 Atherosclerotic heart disease of native coronary artery without angina pectoris: Secondary | ICD-10-CM

## 2017-09-05 LAB — BLOOD GAS, ARTERIAL
Acid-Base Excess: 10.6 mmol/L — ABNORMAL HIGH (ref 0.0–2.0)
Bicarbonate: 34.6 mmol/L — ABNORMAL HIGH (ref 20.0–28.0)
Drawn by: 252031
FIO2: 40
MECHVT: 600 mL
O2 Saturation: 97.1 %
PEEP: 8 cmH2O
Patient temperature: 98.6
RATE: 24 resp/min
pCO2 arterial: 45.4 mmHg (ref 32.0–48.0)
pH, Arterial: 7.494 — ABNORMAL HIGH (ref 7.350–7.450)
pO2, Arterial: 84.2 mmHg (ref 83.0–108.0)

## 2017-09-05 LAB — BPAM RBC
Blood Product Expiration Date: 201810122359
Blood Product Expiration Date: 201810122359
Blood Product Expiration Date: 201810162359
Blood Product Expiration Date: 201810182359
ISSUE DATE / TIME: 201809241727
ISSUE DATE / TIME: 201809250038
ISSUE DATE / TIME: 201809270520
ISSUE DATE / TIME: 201809270835
Unit Type and Rh: 5100
Unit Type and Rh: 5100
Unit Type and Rh: 5100
Unit Type and Rh: 5100

## 2017-09-05 LAB — CBC
HCT: 26.3 % — ABNORMAL LOW (ref 39.0–52.0)
HCT: 25.3 % — ABNORMAL LOW (ref 39.0–52.0)
HCT: 27.2 % — ABNORMAL LOW (ref 39.0–52.0)
HCT: 26.3 % — ABNORMAL LOW (ref 39.0–52.0)
HEMATOCRIT: 24.7 % — AB (ref 39.0–52.0)
Hemoglobin: 8.3 g/dL — ABNORMAL LOW (ref 13.0–17.0)
Hemoglobin: 8.5 g/dL — ABNORMAL LOW (ref 13.0–17.0)
Hemoglobin: 8.5 g/dL — ABNORMAL LOW (ref 13.0–17.0)
Hemoglobin: 8.9 g/dL — ABNORMAL LOW (ref 13.0–17.0)
Hemoglobin: 8.9 g/dL — ABNORMAL LOW (ref 13.0–17.0)
MCH: 28.1 pg (ref 26.0–34.0)
MCH: 28.6 pg (ref 26.0–34.0)
MCH: 29.1 pg (ref 26.0–34.0)
MCH: 29.2 pg (ref 26.0–34.0)
MCH: 29.5 pg (ref 26.0–34.0)
MCHC: 32.3 g/dL (ref 30.0–36.0)
MCHC: 32.7 g/dL (ref 30.0–36.0)
MCHC: 33.6 g/dL (ref 30.0–36.0)
MCHC: 33.6 g/dL (ref 30.0–36.0)
MCHC: 33.8 g/dL (ref 30.0–36.0)
MCV: 86.7 fL (ref 78.0–100.0)
MCV: 86.9 fL (ref 78.0–100.0)
MCV: 87.1 fL (ref 78.0–100.0)
MCV: 87.1 fL (ref 78.0–100.0)
MCV: 87.5 fL (ref 78.0–100.0)
PLATELETS: 192 10*3/uL (ref 150–400)
Platelets: 217 10*3/uL (ref 150–400)
Platelets: 202 10*3/uL (ref 150–400)
Platelets: 212 10*3/uL (ref 150–400)
Platelets: 221 10*3/uL (ref 150–400)
RBC: 2.85 MIL/uL — AB (ref 4.22–5.81)
RBC: 2.91 MIL/uL — ABNORMAL LOW (ref 4.22–5.81)
RBC: 3.02 MIL/uL — ABNORMAL LOW (ref 4.22–5.81)
RBC: 3.02 MIL/uL — ABNORMAL LOW (ref 4.22–5.81)
RBC: 3.11 MIL/uL — ABNORMAL LOW (ref 4.22–5.81)
RDW: 15.5 % (ref 11.5–15.5)
RDW: 15.6 % — ABNORMAL HIGH (ref 11.5–15.5)
RDW: 15.7 % — ABNORMAL HIGH (ref 11.5–15.5)
RDW: 15.8 % — ABNORMAL HIGH (ref 11.5–15.5)
RDW: 16 % — ABNORMAL HIGH (ref 11.5–15.5)
WBC: 17.7 10*3/uL — ABNORMAL HIGH (ref 4.0–10.5)
WBC: 18.2 10*3/uL — AB (ref 4.0–10.5)
WBC: 18.9 10*3/uL — ABNORMAL HIGH (ref 4.0–10.5)
WBC: 20.2 10*3/uL — ABNORMAL HIGH (ref 4.0–10.5)
WBC: 21.1 10*3/uL — ABNORMAL HIGH (ref 4.0–10.5)

## 2017-09-05 LAB — TYPE AND SCREEN
ABO/RH(D): O POS
Antibody Screen: NEGATIVE
Unit division: 0
Unit division: 0
Unit division: 0
Unit division: 0

## 2017-09-05 LAB — GLUCOSE, CAPILLARY
GLUCOSE-CAPILLARY: 102 mg/dL — AB (ref 65–99)
GLUCOSE-CAPILLARY: 107 mg/dL — AB (ref 65–99)
GLUCOSE-CAPILLARY: 114 mg/dL — AB (ref 65–99)
GLUCOSE-CAPILLARY: 118 mg/dL — AB (ref 65–99)
GLUCOSE-CAPILLARY: 118 mg/dL — AB (ref 65–99)
GLUCOSE-CAPILLARY: 124 mg/dL — AB (ref 65–99)
GLUCOSE-CAPILLARY: 142 mg/dL — AB (ref 65–99)
GLUCOSE-CAPILLARY: 161 mg/dL — AB (ref 65–99)
GLUCOSE-CAPILLARY: 162 mg/dL — AB (ref 65–99)
GLUCOSE-CAPILLARY: 163 mg/dL — AB (ref 65–99)
GLUCOSE-CAPILLARY: 91 mg/dL (ref 65–99)
GLUCOSE-CAPILLARY: 98 mg/dL (ref 65–99)
Glucose-Capillary: 117 mg/dL — ABNORMAL HIGH (ref 65–99)
Glucose-Capillary: 94 mg/dL (ref 65–99)
Glucose-Capillary: 126 mg/dL — ABNORMAL HIGH (ref 65–99)
Glucose-Capillary: 137 mg/dL — ABNORMAL HIGH (ref 65–99)
Glucose-Capillary: 119 mg/dL — ABNORMAL HIGH (ref 65–99)
Glucose-Capillary: 156 mg/dL — ABNORMAL HIGH (ref 65–99)
Glucose-Capillary: 162 mg/dL — ABNORMAL HIGH (ref 65–99)
Glucose-Capillary: 139 mg/dL — ABNORMAL HIGH (ref 65–99)
Glucose-Capillary: 104 mg/dL — ABNORMAL HIGH (ref 65–99)
Glucose-Capillary: 160 mg/dL — ABNORMAL HIGH (ref 65–99)
Glucose-Capillary: 106 mg/dL — ABNORMAL HIGH (ref 65–99)
Glucose-Capillary: 93 mg/dL (ref 65–99)

## 2017-09-05 LAB — POCT I-STAT 4, (NA,K, GLUC, HGB,HCT)
GLUCOSE: 128 mg/dL — AB (ref 65–99)
GLUCOSE: 151 mg/dL — AB (ref 65–99)
Glucose, Bld: 108 mg/dL — ABNORMAL HIGH (ref 65–99)
HCT: 22 % — ABNORMAL LOW (ref 39.0–52.0)
HEMATOCRIT: 28 % — AB (ref 39.0–52.0)
HEMATOCRIT: 23 % — AB (ref 39.0–52.0)
HEMOGLOBIN: 9.5 g/dL — AB (ref 13.0–17.0)
HEMOGLOBIN: 7.8 g/dL — AB (ref 13.0–17.0)
Hemoglobin: 7.5 g/dL — ABNORMAL LOW (ref 13.0–17.0)
POTASSIUM: 3.9 mmol/L (ref 3.5–5.1)
POTASSIUM: 3.9 mmol/L (ref 3.5–5.1)
Potassium: 4 mmol/L (ref 3.5–5.1)
SODIUM: 136 mmol/L (ref 135–145)
Sodium: 136 mmol/L (ref 135–145)
Sodium: 135 mmol/L (ref 135–145)

## 2017-09-05 LAB — POCT ACTIVATED CLOTTING TIME
ACTIVATED CLOTTING TIME: 142 s
ACTIVATED CLOTTING TIME: 142 s
ACTIVATED CLOTTING TIME: 164 s
ACTIVATED CLOTTING TIME: 164 s
ACTIVATED CLOTTING TIME: 164 s
ACTIVATED CLOTTING TIME: 153 s
ACTIVATED CLOTTING TIME: 175 s
Activated Clotting Time: 142 seconds
Activated Clotting Time: 142 seconds
Activated Clotting Time: 142 seconds
Activated Clotting Time: 142 seconds
Activated Clotting Time: 147 seconds
Activated Clotting Time: 153 seconds
Activated Clotting Time: 153 seconds
Activated Clotting Time: 164 seconds
Activated Clotting Time: 164 seconds

## 2017-09-05 LAB — POCT I-STAT, CHEM 8
BUN: 52 mg/dL — AB (ref 6–20)
CALCIUM ION: 1.14 mmol/L — AB (ref 1.15–1.40)
CHLORIDE: 93 mmol/L — AB (ref 101–111)
CREATININE: 1.7 mg/dL — AB (ref 0.61–1.24)
Glucose, Bld: 175 mg/dL — ABNORMAL HIGH (ref 65–99)
HCT: 27 % — ABNORMAL LOW (ref 39.0–52.0)
Hemoglobin: 9.2 g/dL — ABNORMAL LOW (ref 13.0–17.0)
Potassium: 4.5 mmol/L (ref 3.5–5.1)
SODIUM: 134 mmol/L — AB (ref 135–145)
TCO2: 30 mmol/L (ref 22–32)

## 2017-09-05 LAB — BASIC METABOLIC PANEL
Anion gap: 11 (ref 5–15)
BUN: 55 mg/dL — AB (ref 6–20)
CALCIUM: 8.6 mg/dL — AB (ref 8.9–10.3)
CO2: 33 mmol/L — AB (ref 22–32)
CREATININE: 1.37 mg/dL — AB (ref 0.61–1.24)
Chloride: 93 mmol/L — ABNORMAL LOW (ref 101–111)
GFR calc non Af Amer: 53 mL/min — ABNORMAL LOW (ref >60)
GLUCOSE: 90 mg/dL (ref 65–99)
Potassium: 3.6 mmol/L (ref 3.5–5.1)
Sodium: 137 mmol/L (ref 135–145)

## 2017-09-05 LAB — HEPARIN LEVEL (UNFRACTIONATED): HEPARIN UNFRACTIONATED: 0.2 [IU]/mL — AB (ref 0.30–0.70)

## 2017-09-05 LAB — PROTIME-INR
INR: 1.16
Prothrombin Time: 14.8 seconds (ref 11.4–15.2)

## 2017-09-05 LAB — COOXEMETRY PANEL
Carboxyhemoglobin: 1 % (ref 0.5–1.5)
Methemoglobin: 1.3 % (ref 0.0–1.5)
O2 Saturation: 54.4 %
Total hemoglobin: 9 g/dL — ABNORMAL LOW (ref 12.0–16.0)

## 2017-09-05 LAB — ECHOCARDIOGRAM LIMITED
Height: 71 in
Weight: 3358.05 oz

## 2017-09-05 LAB — APTT: aPTT: 48 seconds — ABNORMAL HIGH (ref 24–36)

## 2017-09-05 LAB — CALCIUM, IONIZED: CALCIUM, IONIZED, SERUM: 4.8 mg/dL (ref 4.5–5.6)

## 2017-09-05 LAB — MAGNESIUM: Magnesium: 1.7 mg/dL (ref 1.7–2.4)

## 2017-09-05 LAB — LACTATE DEHYDROGENASE: LDH: 366 U/L — ABNORMAL HIGH (ref 98–192)

## 2017-09-05 LAB — PREPARE RBC (CROSSMATCH)

## 2017-09-05 MED ORDER — FUROSEMIDE 10 MG/ML IJ SOLN
8.0000 mg/h | INTRAMUSCULAR | Status: DC
  Filled 2017-09-05: qty 25

## 2017-09-05 MED ORDER — SODIUM CHLORIDE 0.9 % IV SOLN: Freq: Once | INTRAVENOUS | Status: AC

## 2017-09-05 MED ORDER — SODIUM CHLORIDE 0.9 % IV SOLN
12.5000 mg | Freq: Three times a day (TID) | INTRAVENOUS | Status: DC | PRN
  Administered 2017-09-05 – 2017-09-07 (×3): 12.5 mg via INTRAVENOUS
  Filled 2017-09-05 (×6): qty 0.5

## 2017-09-05 MED ORDER — HEPARIN (PORCINE) IN NACL 100-0.45 UNIT/ML-% IJ SOLN
800.0000 [IU]/h | INTRAMUSCULAR | Status: DC
  Administered 2017-09-05: 1800 [IU]/h via INTRAVENOUS
  Administered 2017-09-06: 2550 [IU]/h via INTRAVENOUS
  Administered 2017-09-06: 1800 [IU]/h via INTRAVENOUS
  Administered 2017-09-07: 900 [IU]/h via INTRAVENOUS
  Filled 2017-09-05 (×4): qty 250

## 2017-09-05 MED ORDER — AMIODARONE LOAD VIA INFUSION
150.0000 mg | Freq: Once | INTRAVENOUS | Status: AC
  Administered 2017-09-05: 150 mg via INTRAVENOUS
  Filled 2017-09-05: qty 83.34

## 2017-09-05 MED ORDER — INSULIN DETEMIR 100 UNIT/ML ~~LOC~~ SOLN
55.0000 [IU] | Freq: Two times a day (BID) | SUBCUTANEOUS | Status: DC
  Administered 2017-09-05 – 2017-09-07 (×6): 55 [IU] via SUBCUTANEOUS
  Filled 2017-09-05 (×8): qty 0.55

## 2017-09-05 MED ORDER — MAGNESIUM SULFATE 4 GM/100ML IV SOLN
4.0000 g | Freq: Once | INTRAVENOUS | Status: AC
  Administered 2017-09-05: 4 g via INTRAVENOUS
  Filled 2017-09-05: qty 100

## 2017-09-05 MED ORDER — PIPERACILLIN-TAZOBACTAM 3.375 G IVPB
3.3750 g | Freq: Three times a day (TID) | INTRAVENOUS | Status: DC
  Administered 2017-09-05 – 2017-09-18 (×39): 3.375 g via INTRAVENOUS
  Filled 2017-09-05 (×40): qty 50

## 2017-09-05 MED ORDER — FUROSEMIDE 10 MG/ML IJ SOLN
20.0000 mg | Freq: Two times a day (BID) | INTRAMUSCULAR | Status: DC
  Administered 2017-09-05 – 2017-09-06 (×4): 20 mg via INTRAVENOUS
  Filled 2017-09-05 (×6): qty 2

## 2017-09-05 MED ORDER — INSULIN DETEMIR 100 UNIT/ML ~~LOC~~ SOLN
60.0000 [IU] | Freq: Two times a day (BID) | SUBCUTANEOUS | Status: DC
  Filled 2017-09-05: qty 0.6

## 2017-09-05 MED ORDER — SORBITOL 70 % SOLN
30.0000 mL | Freq: Once | Status: AC
  Administered 2017-09-05: 30 mL via ORAL
  Filled 2017-09-05: qty 30

## 2017-09-05 MED ORDER — POTASSIUM CHLORIDE 10 MEQ/50ML IV SOLN
10.0000 meq | INTRAVENOUS | Status: AC
  Administered 2017-09-05 (×3): 10 meq via INTRAVENOUS
  Filled 2017-09-05 (×3): qty 50

## 2017-09-05 NOTE — Progress Notes (Signed)
Patient ID: DEWAN EMOND, male   DOB: 04/05/1951, 66 y.o.   MRN: [id number] EVENING ROUNDS NOTE :     [address]       [place] [address]             [phone]                 8 Days Post-Op Procedure(s) (LRB): CORONARY ARTERY BYPASS GRAFTING (CABG) times four using left internal mammary artery and right greater saphenous leg vein using endoscope. (N/A) TRANSESOPHAGEAL ECHOCARDIOGRAM (TEE) (N/A) PLACEMENT OF CENTRIMAG VENTRICULAR ASSIST DEVICE (Right)  Total Length of Stay:  LOS: 13 days  BP (!) 108/58   Pulse (!) 117   Temp (!) 100.4 F (38 C)   Resp 14   Ht  (1.803 m)   Wt 209 lb 14.1 oz (95.2 kg)   SpO2 99%   BMI 29.27 kg/m   .Intake/Output      09/28 0701 - 09/29 0700   I.V. (mL/kg) 1400.5 (14.7)   Blood 310   NG/GT 940   IV Piggyback 200   Total Intake(mL/kg) 2850.5 (29.9)   Urine (mL/kg/hr) 2230 (1.6)   Emesis/NG output 800   Chest Tube 260   Total Output 3290   Net -439.5         . sodium chloride    . sodium chloride 10 mL (09/01/17 0800)  . sodium chloride 10 mL/hr at 09/05/17 2100  . sodium chloride 10 mL/hr at 09/05/17 2100  . amiodarone 30 mg/hr (09/05/17 2115)  . chlorproMAZINE (THORAZINE) IV 12.5 mg (09/05/17 2000)  . EPINEPHrine 4 mg in dextrose 5% 250 mL infusion (16 mcg/mL) 3.013 mcg/min (09/05/17 2100)  . feeding supplement (VITAL 1.5 CAL) 1,000 mL (09/05/17 2100)  . fentaNYL infusion INTRAVENOUS 400 mcg/hr (09/05/17 2100)  . heparin 2,100 Units/hr (09/05/17 2110)  . insulin (NOVOLIN-R) infusion 4.8 mL/hr at 09/05/17 2100  . midazolam (VERSED) infusion 6 mg/hr (09/05/17 2100)  . milrinone 0.2 mcg/kg/min (09/05/17 2114)  . norepinephrine (LEVOPHED) Adult infusion 12.053 mcg/min (09/05/17 2100)  . piperacillin-tazobactam (ZOSYN)  IV Stopped (09/05/17 1640)  . vancomycin Stopped (09/05/17 1926)  . vasopressin (PITRESSIN) infusion - *FOR SHOCK* Stopped (09/02/17 0730)     Lab Results  Component Value Date   WBC 20.2 (H) 09/05/2017   HGB 8.5 (L) 09/05/2017   HCT 26.3 (L) 09/05/2017   PLT 221 09/05/2017   GLUCOSE 175 (H) 09/05/2017   CHOL 126 08/24/2017   TRIG 240 (H) 08/24/2017   HDL 31 (L) 08/24/2017   LDLDIRECT 128.6 10/30/2012   LDLCALC 47 08/24/2017   ALT 20 09/04/2017   AST 41 09/04/2017   NA 134 (L) 09/05/2017   K 4.5 09/05/2017   CL 93 (L) 09/05/2017   CREATININE 1.70 (H) 09/05/2017   BUN 52 (H) 09/05/2017   CO2 33 (H) 09/05/2017   TSH 0.722 08/25/2017   INR 1.16 09/05/2017   HGBA1C 5.9 (H) 08/25/2017   MICROALBUR 30.9 (H) 10/30/2012   Sedated on vent , act 153 should be 180 to 190 at 3-4 l/m  [name]  Beeper [phone] Office [phone] 09/05/2017 9:41 PM

## 2017-09-05 NOTE — Progress Notes (Signed)
PULMONARY / CRITICAL CARE MEDICINE   Name: Clinton Harrington MRN: [id number] DOB: February 10, 1951    ADMISSION DATE:  08/23/2017 CONSULTATION DATE:  08/30/17  REFERRING MD: Dr Roxy Manns  CHIEF COMPLAINT:  Acute resp failure, hypoxia, hypercapnia  brief:  Patient is a 66 y/o Caucasian M with hx CADs/p BMS to RCA in 1999 (due to an inferior STEMI), significant PVD, HTN, Type 2 DM (non-insulin dependent), HLP, Tobacco use comes in today complaining of substernal chest pain on admission.He continued to have active CP and the patient was taken to cath lab for further intervention. FOund with RV infacrct and required emergent bypass surgery. Remained in cardiogenic shock requiring balloon pump and placement of RVAD device. Remained on vent with acute resp failure, skin closed but sternum is not closed.He has developed worsening acidosis and hypoxia with bilateral int infiltrates. He has been pos 4 liters last 2 4 hours. He remains on levophed and milrinone. Asked to assist in critical care.  CULTURES: 9/22 sputum>>>ng 9/24>> Sputum>>ng 9/24 Blood>> 9/24 Urine>>ng  ANTIBIOTICS: zinacef >>>9/22- 9/24 ceftaz 9/22>>> Vanc>>>9/24>>  SIGNIFICANT EVENTS: 08/23/2017 - admit 9/22 - intubated Cath cabg RV assist device 9/23 - On epi/levo/RVAD/vent.  CI 1.8 despite all interventions 9/24>> Epi, Levo, milrinine,amio,insulin gtt, Lasix gtt, Heparin, Fent, Versed 9/26 - Remains sedated on fent/ versed drips RVAD flows down to 3200 Good UO on lasix gtt @ 15 9/27 - 40% fio2, peep 8. RPM on RVAD turned down. Stil on levophed, milrinone, epi ggtt and heparin.admio gtt. Making urine. Has chest draines. Good urine outoput. Heparin on hold briefly before resumptions due to bleed in groin site. Getting 2 UI PRBC  SUBJECTIVE/OVERNIGHT/INTERVAL HX FiO2 40%/ peep 8 Remains on insulin, epi, milrinone, amio, lasix, levophed, fent, and versed gtts UOP good, Co-ox 54.4 CXR stable Per RN no acute events overnight other  than hiccups   VITAL SIGNS: BP 111/66   Pulse (!) 129   Temp 99.9 F (37.7 C)   Resp (!) 24   Ht _0  (1.803 m)   Wt 209 lb 14.1 oz (95.2 kg)   SpO2 98%   BMI 29.27 kg/m   HEMODYNAMICS: PAP: (15-30)/(10-23) 26/20 CVP:  [3 mmHg-25 mmHg] 12 mmHg CO:  [4.8 L/min-5.3 L/min] 4.8 L/min CI:  [2.3 L/min/m2-2.4 L/min/m2] 2.3 L/min/m2  VENTILATOR SETTINGS: Vent Mode: PRVC FiO2 (%):  [40 %] 40 % Set Rate:  [24 bmp] 24 bmp Vt Set:  [600 mL] 600 mL PEEP:  [8 cmH20] 8 cmH20 Plateau Pressure:  [24 cmH20-28 cmH20] 25 cmH20  INTAKE / OUTPUT: I/O last 3 completed shifts: In: 8207.6 [I.V.:4135.9; Blood:661.7; NG/GT:2510; IV Piggyback:900] Out: 17510 [CHENI:7782; Emesis/NG output:2300; Chest Tube:900]  PHYSICAL EXAMINATION: General:  Critically ill male lying in bed on MV HEENT: ETT, OGT, R nare cortrak,  Neuro: sedated after fentanyl bolus, has been f/c w/purposeful movement CV: rrr, RVAD, multiple CT PULM: even/non-labored on MV, lungs bilaterally clear GI: soft, bs hypo Extremities: warm/dry, no edema  Skin: no rashes   PULMONARY  Recent Labs Lab 09/02/17 1509 09/02/17 1601 09/02/17 1606  09/03/17 0320 09/03/17 1551 09/04/17 0330 09/04/17 0340 09/04/17 1627 09/05/17 0309 09/05/17 0312  PHART 7.350  --  7.432  --  7.371  --   --  7.441  --  7.494*  --   PCO2ART 58.5*  --  46.3  --  54.9*  --   --  51.3*  --  45.4  --   PO2ART 77.0*  --  94.0  --  72.1*  --   --  108  --  84.2  --   HCO3 32.3*  --  31.0*  --  31.0*  --   --  34.3*  --  34.6*  --   TCO2 34* 30 32  --   --  30  --   --  34*  --   --   O2SAT 94.0  --  98.0  < > 93.8  --  70.8 97.9  --  97.1 54.4  < > = values in this interval not displayed.  CBC  Recent Labs Lab 09/04/17 2342 09/05/17 0346 09/05/17 0800  HGB 8.3* 8.9* 8.5*  HCT 24.7* 26.3* 25.3*  WBC 18.2* 18.9* 17.7*  PLT 192 217 202    COAGULATION  Recent Labs Lab 09/02/17 0025 09/02/17 0354 09/03/17 0255 09/04/17 0324  09/05/17 0346  INR 1.18 1.19 1.11 1.15 1.16    CARDIAC  No results for input(s): TROPONINI in the last 168 hours. No results for input(s): PROBNP in the last 168 hours.   CHEMISTRY  Recent Labs Lab 08/29/17 1613  08/30/17 2000  09/01/17 0220  09/02/17 0354 09/02/17 1104  09/03/17 0255  09/03/17 0800  09/03/17 1551  09/04/17 0324 09/04/17 0454 09/04/17 1015 09/04/17 1627 09/04/17 2159 09/05/17 0346  NA  --   < > 128*  < > 121*  < > 121* 123*  < > 126*  < >  --   < > 131*  < > 133* 136 136 136 136 137  K  --   < > 3.7  < > 3.7  < > 3.1* 3.1*  < > 4.0  < >  --   < > 3.7  < > 4.2 4.1 4.0 4.0 3.9 3.6  CL  --   < > 96*  < > 84*  < > 82* 82*  < > 87*  --   --   --  87*  --  92*  --   --  90*  --  93*  CO2  --   < > 24  < > 27  < > 28 29  --  29  --   --   --   --   --  32  --   --   --   --  33*  GLUCOSE  --   < > 156*  < > 391*  < > 306* 417*  < > 433*  < >  --   < > 413*  < > 166* 168* 138* 128* 128* 90  BUN  --   < > 19  < > 19  < > 26* 28*  < > 35*  --   --   --  39*  --  47*  --   --  46*  --  55*  CREATININE 1.08  < > 1.03  < > 1.19  < > 1.12 1.15  < > 1.39*  --   --   --  1.30*  --  1.46*  --   --  1.40*  --  1.37*  CALCIUM  --   < > 7.3*  < > 7.6*  < > 7.7* 7.8*  --  7.9*  --   --   --   --   --  8.2*  --   --   --   --  8.6*  MG 1.9  --  1.6*  --  1.5*  --   --   --   --   --   --  1.7  --   --   --   --   --   --   --   --  1.7  PHOS  --   --  2.7  --  1.7*  --   --   --   --   --   --   --   --   --   --   --   --   --   --   --   --   < > = values in this interval not displayed. Estimated Creatinine Clearance: 65 mL/min (A) (by C-G formula based on SCr of 1.37 mg/dL (H)).   LIVER  Recent Labs Lab 08/31/17 0255 09/01/17 0220  09/02/17 0025 09/02/17 0354 09/03/17 0255 09/04/17 0324 09/05/17 0346  AST 39 45*  --   --  42* 45* 41  --   ALT 15* 17  --   --  16* 19 20  --   ALKPHOS 65 71  --   --  81 97 82  --   BILITOT 0.7 0.8  --   --  1.3* 0.9 0.7  --   PROT  5.1* 5.1*  --   --  5.3* 5.6* 5.3*  --   ALBUMIN 2.1* 2.1*  --   --  2.1* 2.1* 1.9*  --   INR 1.50  --   < > 1.18 1.19 1.11 1.15 1.16  < > = values in this interval not displayed.   INFECTIOUS No results for input(s): LATICACIDVEN, PROCALCITON in the last 168 hours.   ENDOCRINE CBG (last 3)   Recent Labs  09/05/17 0556 09/05/17 0658 09/05/17 0807  GLUCAP 137* 118* 124*         IMAGING x48h  - image(s) personally visualized  -   highlighted in bold Dg Chest Port 1 View  Result Date: 09/05/2017 CLINICAL DATA: History of coronary bypass graft EXAM: PORTABLE CHEST 1 VIEW COMPARISON:  09/04/2017 FINDINGS: Swan-Ganz catheter, endotracheal tube, nasogastric catheter and feeding catheter are again seen and stable. Bilateral chest tubes are seen and stable. Cannula identified bilaterally consistent with the external bypass machine. Right-sided PICC line is noted with the catheter tip at the cavoatrial junction. No pneumothorax is seen. Left pleural effusion is noted and stable in appearance. Left basilar atelectasis is noted stable from the prior exam. No new focal abnormality is noted. IMPRESSION: Tubes and lines as described above stable from the previous exam. Left basilar atelectasis and small effusion unchanged from the prior exam. Electronically Signed   By: Inez Catalina M.D.   On: 09/05/2017 08:01   Dg Chest Port 1 View  Result Date: 09/04/2017 CLINICAL DATA:  Hypoxia EXAM: PORTABLE CHEST 1 VIEW COMPARISON:  September 03, 2017 FINDINGS: Endotracheal tube tip is 3.9 cm above the carina. Swan-Ganz catheter tip is in the main pulmonary outflow tract. There are chest tubes and mediastinal drain bilaterally. There is a cannula to the right and left of midline, stable. Nasogastric tube tip and side port are below the diaphragm. Temporary pacemaker wires are attached to the right heart. No pneumothorax. There is a small pleural effusion on the left with patchy atelectasis in the left lower  lobe. Right lung is clear. There is cardiomegaly with pulmonary venous hypertension. There is aortic atherosclerosis. No bone lesions are evident. IMPRESSION: Tube and catheter positions as described without pneumothorax. Persistent left lower lobe atelectasis with small left pleural effusion. No new opacity evident. Stable cardiac silhouette. There is  aortic atherosclerosis. Aortic Atherosclerosis (ICD10-I70.0). Electronically Signed   By: Lowella Grip III M.D.   On: 09/04/2017 07:57   ASSESSMENT / PLAN:  PULMONARY A: 09/05/2017 -> acute resp failure due to cardiogenic shock  P:   Full vent support. WEan peeep/fio2 as able Lasix gtt Daily CXR and ABG  CARDIOVASCULAR A:   09/05/2017 -> cardiogenic shock  P:  Per cards and cvts  RENAL  Intake/Output Summary (Last 24 hours) at 09/05/17 0921 Last data filed at 09/05/17 0900  Gross per 24 hour  Intake          4997.83 ml  Output             6945 ml  Net         -1947.17 ml    Recent Labs Lab 09/03/17 0255 09/03/17 1551 09/04/17 0324 09/04/17 1627 09/05/17 0346  CREATININE 1.39* 1.30* 1.46* 1.40* 1.37*     A:    09/05/2017 -> Mil AKI with lasix gtt Mild met alk- likely r/t lasix  P:   Monitor renal function Trend UOP Replace electrolytes as needed   GASTROINTESTINAL A:   Tolerating TF  P:   TF PPI  HEMATOLOGIC  Recent Labs Lab 09/04/17 2342 09/05/17 0346 09/05/17 0800  HGB 8.3* 8.9* 8.5*  HCT 24.7* 26.3* 25.3*  WBC 18.2* 18.9* 17.7*  PLT 192 217 202    A:   #RBC: anemia criticall illness #Platelet *normal #WBC hight  P:  - trend CBC - PRBC for hgb </= 8-8.5gm%      INFECTIOUS No results for input(s): PROCALCITON in the last 168 hours.  Results for orders placed or performed during the hospital encounter of 08/23/17  MRSA PCR Screening     Status: None   Collection Time: 08/23/17  9:00 PM  Result Value Ref Range Status   MRSA by PCR NEGATIVE NEGATIVE Final    Comment:        The  GeneXpert MRSA Assay (FDA approved for NASAL specimens only), is one component of a comprehensive MRSA colonization surveillance program. It is not intended to diagnose MRSA infection nor to guide or monitor treatment for MRSA infections.   Surgical pcr screen     Status: Abnormal   Collection Time: 08/24/17  9:54 AM  Result Value Ref Range Status   MRSA, PCR NEGATIVE NEGATIVE Final   Staphylococcus aureus POSITIVE (A) NEGATIVE Final    Comment: (NOTE) The Xpert SA Assay (FDA approved for NASAL specimens in patients 72 years of age and older), is one component of a comprehensive surveillance program. It is not intended to diagnose infection nor to guide or monitor treatment.   Culture, respiratory (NON-Expectorated)     Status: None   Collection Time: 08/28/17  3:42 PM  Result Value Ref Range Status   Specimen Description TRACHEAL ASPIRATE  Final   Special Requests NONE  Final   Gram Stain   Final    RARE SQUAMOUS EPITHELIAL CELLS PRESENT NO WBC SEEN FEW GRAM POSITIVE COCCI    Culture Consistent with normal respiratory flora.  Final   Report Status 08/29/2017 FINAL  Final  Culture, respiratory (NON-Expectorated)     Status: None   Collection Time: 08/30/17 11:27 AM  Result Value Ref Range Status   Specimen Description TRACHEAL ASPIRATE  Final   Special Requests NONE  Final   Gram Stain NO WBC SEEN NO ORGANISMS SEEN   Final   Culture Consistent with normal respiratory flora.  Final   Report  Status 09/01/2017 FINAL  Final  Urine Culture     Status: None   Collection Time: 09/01/17  8:56 AM  Result Value Ref Range Status   Specimen Description URINE, CATHETERIZED  Final   Special Requests Normal  Final   Culture NO GROWTH  Final   Report Status 09/02/2017 FINAL  Final  Culture, respiratory (NON-Expectorated)     Status: None   Collection Time: 09/01/17 11:40 AM  Result Value Ref Range Status   Specimen Description TRACHEAL ASPIRATE  Final   Special Requests Normal   Final   Gram Stain   Final    ABUNDANT WBC PRESENT,BOTH PMN AND MONONUCLEAR NO ORGANISMS SEEN    Culture Consistent with normal respiratory flora.  Final   Report Status 09/03/2017 FINAL  Final  Culture, blood (Routine X 2) w Reflex to ID Panel     Status: None (Preliminary result)   Collection Time: 09/01/17  3:55 PM  Result Value Ref Range Status   Specimen Description BLOOD LEFT HAND  Final   Special Requests   Final    BOTTLES DRAWN AEROBIC ONLY Blood Culture adequate volume   Culture NO GROWTH 3 DAYS  Final   Report Status PENDING  Incomplete  Culture, blood (Routine X 2) w Reflex to ID Panel     Status: None (Preliminary result)   Collection Time: 09/01/17  3:55 PM  Result Value Ref Range Status   Specimen Description BLOOD LEFT HAND  Final   Special Requests IN PEDIATRIC BOTTLE Blood Culture adequate volume  Final   Culture NO GROWTH 3 DAYS  Final   Report Status PENDING  Incomplete    A:   Nil acute P:   Anti-infectives    Start     Dose/Rate Route Frequency Ordered Stop   09/03/17 1800  vancomycin (VANCOCIN) 1,500 mg in sodium chloride 0.9 % 250 mL IVPB     1,500 mg 125 mL/hr over 120 Minutes Intravenous Every 24 hours 09/03/17 0943     09/02/17 2200  vancomycin (VANCOCIN) 1,000 mg in sodium chloride 0.9 % 100 mL IVPB  Status:  Discontinued     1,000 mg 100 mL/hr over 60 Minutes Intravenous Every 12 hours 09/02/17 1323 09/03/17 0942   09/01/17 2200  vancomycin (VANCOCIN) IVPB 1000 mg/200 mL premix  Status:  Discontinued     1,000 mg 200 mL/hr over 60 Minutes Intravenous Every 12 hours 09/01/17 1118 09/01/17 1118   09/01/17 2200  vancomycin (VANCOCIN) 1,000 mg in sodium chloride 0.9 % 250 mL IVPB  Status:  Discontinued     1,000 mg 250 mL/hr over 60 Minutes Intravenous Every 12 hours 09/01/17 1122 09/01/17 1454   09/01/17 2200  vancomycin (VANCOCIN) IVPB 1000 mg/200 mL premix  Status:  Discontinued     1,000 mg 200 mL/hr over 60 Minutes Intravenous Every 12 hours  09/01/17 1454 09/02/17 1323   08/30/17 1400  cefTAZidime (FORTAZ) 1 g in dextrose 5 % 50 mL IVPB     1 g 100 mL/hr over 30 Minutes Intravenous Every 8 hours 08/30/17 1036     08/29/17 0830  vancomycin (VANCOCIN) IVPB 1000 mg/200 mL premix  Status:  Discontinued     1,000 mg 200 mL/hr over 60 Minutes Intravenous Every 12 hours 08/28/17 1545 09/01/17 1117   08/28/17 2030  vancomycin (VANCOCIN) IVPB 1000 mg/200 mL premix     1,000 mg 200 mL/hr over 60 Minutes Intravenous  Once 08/28/17 1502 08/28/17 2300   08/28/17 2000  cefUROXime (  ZINACEF) 1.5 g in dextrose 5 % 50 mL IVPB  Status:  Discontinued     1.5 g 100 mL/hr over 30 Minutes Intravenous Every 12 hours 08/28/17 1502 08/30/17 1036   08/28/17 0400  vancomycin (VANCOCIN) 1,500 mg in sodium chloride 0.9 % 250 mL IVPB     1,500 mg 125 mL/hr over 120 Minutes Intravenous To Surgery 08/27/17 1000 08/28/17 1010   08/28/17 0400  cefUROXime (ZINACEF) 1.5 g in dextrose 5 % 50 mL IVPB     1.5 g 100 mL/hr over 30 Minutes Intravenous To Surgery 08/27/17 1000 08/28/17 1230   08/28/17 0400  cefUROXime (ZINACEF) 750 mg in dextrose 5 % 50 mL IVPB  Status:  Discontinued     750 mg 100 mL/hr over 30 Minutes Intravenous To Surgery 08/27/17 1000 08/28/17 1500       ENDOCRINE A:   hyperglyucemia improved after reducing stress dose steroids P:   ICU hyperglycemia protocol continue hydrocortisone 50 mg daily   NEUROLOGIC A:   #Current: follows commands on wua P:   RASS goal: 0 to -3 fent and versed gtt to continue   FAMILY  - Updates: 09/05/2017 -->   - Inter-disciplinary family meet or Palliative Care meeting due by:  Ongoing.  Kennieth Rad, AGACNP-BC [facility] Pgr: [phone] or if no answer [phone] 09/05/2017, 9:21 AM

## 2017-09-05 NOTE — Progress Notes (Signed)
09/05/2017 1500 Post transfusion, pt. CVP 15-16. Coarse BS LUL/LLL, Dr. Laneta Simmers made aware. Telephone verbal order received to administer IV lasix scheduled for later this evening now. Orders enacted. Will continue to closely monitor patient.  Kable Haywood, Blanchard Kelch

## 2017-09-05 NOTE — Progress Notes (Signed)
Nutrition Follow Up  DOCUMENTATION CODES:   Not applicable  INTERVENTION:    Continue Vital 1.5 formula at goal rate of 50 ml/hr with Prostat 30 ml TID  TF regimen providing 2100 kcals, 126 gm protein, 917 ml of free water daily   NUTRITION DIAGNOSIS:   Inadequate oral intake related to inability to eat as evidenced by NPO status, ongoing  GOAL:   Patient will meet greater than or equal to 90% of their needs, met  MONITOR:   Vent status, TF tolerance, Weight trends, Skin, Labs, I & O's  ASSESSMENT:   66 yo Male with PMH of DM, HTN; found to have severe three-vessel disease; presented for surgical intervention.  9/21 Cortrak placed  Patient is currently intubated on ventilator support MV: 15.1 L/min Temp (24hrs), Avg:99.2 F (37.3 C), Min:98.1 F (36.7 C), Max:100.8 F (38.2 C)  Pt s/p CABG x 4 and placement of RVAD using CentriMag pump 9/20. Vital 1.5 formula infusing at goal rate of 50 ml/hr via Cortrak feeding tube. PS 30 ml TID. Medications reviewed and include Levophed, ABX and Protonix. Labs reviewed. HCT 22.0 (L). Hgb 7.5 (L). CBG's [id number].  Diet Order:  Diet NPO time specified Diet NPO time specified  Skin:  Reviewed, no issues  Last BM:  9/22   Intake/Output Summary (Last 24 hours) at 09/05/17 1213 Last data filed at 09/05/17 1200  Gross per 24 hour  Intake          4680.39 ml  Output             6400 ml  Net         -1719.61 ml   Height:   Ht Readings from Last 1 Encounters:  08/31/17 _0  (1.803 m)   Weight:   Wt Readings from Last 1 Encounters:  09/05/17 209 lb 14.1 oz (95.2 kg)  Admit wt         200 lb (90.7 kg)  Ideal Body Weight:  78.1 kg  BMI:  Body mass index is 29.27 kg/m.  Estimated Nutritional Needs:   Kcal:  2285  Protein:  120-135 gm  Fluid:  per MD  EDUCATION NEEDS:   No education needs identified at this time  Arthur Holms, RD, LDN Pager #: [phone] After-Hours Pager #: [phone]

## 2017-09-05 NOTE — Progress Notes (Signed)
  Echocardiogram 2D Echocardiogram limited has been performed.  Clinton Harrington 09/05/2017, 12:02 PM

## 2017-09-05 NOTE — Progress Notes (Signed)
Patient ID: RANEN DOOLIN, male   DOB: 05-03-1951, 66 y.o.   MRN: [id number]     Advanced Heart Failure Rounding Note   Subjective:    Patient remains intubated and sedated.  IABP removed 9/24.  Centrimag RVAD remains in place.  Remains on Epi 2, milrionone 0.2, Norepi 4, and amio 30 mg/hr.   Intubated and sedated. Was following commands this am but became agitated with bath and needed more sedation, pressors turned up slightly as above.   Na 137 this am. 5.0 L of UO. I/O negative 1.5 L overall. Weight down another 2 lbs.   He remains in atrial fibrillation/flutter in 110-120s .  Co-ox 54.4% this am.    Luiz Blare numbers: CVP 11.  PA 22/17 CO 4.3 CI 2.0  Centrimag  2800 rpm Flow 3.79 L/min  Echo (09/01/17): EF 65-70% with moderate LV hypertrophy, mildly dilated RV with moderately decreased systolic function, IVC dilated.   Objective:   Weight Range: 209 lb 14.1 oz (95.2 kg) Body mass index is 29.27 kg/m.   Vital Signs:   Temp:  [97.9 F (36.6 C)-100.4 F (38 C)] 99.5 F (37.5 C) (09/28 0700) Pulse Rate:  [81-128] 127 (09/28 0700) Resp:  [0-28] 26 (09/28 0700) BP: (78-154)/(49-77) 105/63 (09/28 0700) SpO2:  [94 %-99 %] 98 % (09/28 0700) Arterial Line BP: (73-150)/(39-74) 112/47 (09/28 0700) FiO2 (%):  [40 %-50 %] 40 % (09/28 0600) Weight:  [209 lb 14.1 oz (95.2 kg)] 209 lb 14.1 oz (95.2 kg) (09/28 0600) Last BM Date: 08/30/17  Weight change: Filed Weights   09/03/17 0400 09/04/17 0500 09/05/17 0600  Weight: 218 lb 11.1 oz (99.2 kg) 211 lb 3.2 oz (95.8 kg) 209 lb 14.1 oz (95.2 kg)    Intake/Output:   Intake/Output Summary (Last 24 hours) at 09/05/17 0734 Last data filed at 09/05/17 0700  Gross per 24 hour  Intake           5005.4 ml  Output             6905 ml  Net          -1899.6 ml      Physical Exam    General: Intubated/sedated.  HEENT: + ETT Neck: Supple. JVP ~ 10 cm. Carotids 2+ bilat; no bruits. No thyromegaly or nodule noted. Cor: PMI  nondisplaced. Irregular, tachy. No M/G/R noted Lungs: Diminished basilar sounds Abdomen: Soft, non-tender, non-distended, no HSM. No bruits or masses. +BS  Extremities: No cyanosis, clubbing, or rash. Trace ankle edema.  Neuro: Intubated/sedated   Telemetry   Atypical atrial flutter, rate 120. Personally reviewed.   EKG    9/25 (personally reviewed): Atypical flutter, RBBB  Labs    CBC  Recent Labs  09/04/17 2342 09/05/17 0346  WBC 18.2* 18.9*  HGB 8.3* 8.9*  HCT 24.7* 26.3*  MCV 86.7 87.1  PLT 192 109   Basic Metabolic Panel  Recent Labs  09/03/17 0800  09/04/17 0324  09/04/17 1627 09/04/17 2159 09/05/17 0346  NA  --   < > 133*  < > 136 136 137  K  --   < > 4.2  < > 4.0 3.9 3.6  CL  --   < > 92*  --  90*  --  93*  CO2  --   --  32  --   --   --  33*  GLUCOSE  --   < > 166*  < > 128* 128* 90  BUN  --   < >  47*  --  46*  --  55*  CREATININE  --   < > 1.46*  --  1.40*  --  1.37*  CALCIUM  --   --  8.2*  --   --   --  8.6*  MG 1.7  --   --   --   --   --  1.7  < > = values in this interval not displayed. Liver Function Tests  Recent Labs  09/03/17 0255 09/04/17 0324  AST 45* 41  ALT 19 20  ALKPHOS 97 82  BILITOT 0.9 0.7  PROT 5.6* 5.3*  ALBUMIN 2.1* 1.9*   No results for input(s): LIPASE, AMYLASE in the last 72 hours. Cardiac Enzymes No results for input(s): CKTOTAL, CKMB, CKMBINDEX, TROPONINI in the last 72 hours.  BNP: BNP (last 3 results)  Recent Labs  08/23/17 1927  BNP 200.0*    ProBNP (last 3 results) No results for input(s): PROBNP in the last 8760 hours.   D-Dimer No results for input(s): DDIMER in the last 72 hours. Hemoglobin A1C No results for input(s): HGBA1C in the last 72 hours. Fasting Lipid Panel No results for input(s): CHOL, HDL, LDLCALC, TRIG, CHOLHDL, LDLDIRECT in the last 72 hours. Thyroid Function Tests No results for input(s): TSH, T4TOTAL, T3FREE, THYROIDAB in the last 72 hours.  Invalid input(s):  FREET3  Other results:   Imaging    No results found.   Medications:     Scheduled Medications: . aspirin EC  325 mg Oral Daily   Or  . aspirin  324 mg Per Tube Daily  . atorvastatin  80 mg Per Tube q1800  . brimonidine  1 drop Right Eye BID  . chlorhexidine gluconate (MEDLINE KIT)  15 mL Mouth Rinse BID  . Chlorhexidine Gluconate Cloth  6 each Topical Q0600  . digoxin  0.125 mg Oral Daily  . feeding supplement (PRO-STAT SUGAR FREE 64)  30 mL Per Tube TID  . hydrocortisone sod succinate (SOLU-CORTEF) inj  50 mg Intravenous Daily  . insulin detemir  55 Units Subcutaneous BID  . mouth rinse  15 mL Mouth Rinse 10 times per day  . mupirocin ointment  1 application Topical BID  . pantoprazole (PROTONIX) IV  40 mg Intravenous Q12H  . sodium chloride flush  10-40 mL Intracatheter Q12H  . sodium chloride flush  3 mL Intravenous Q12H    Infusions: . sodium chloride    . sodium chloride 10 mL (09/01/17 0800)  . sodium chloride 10 mL/hr at 09/05/17 0700  . sodium chloride 10 mL/hr at 09/05/17 0700  . amiodarone 30 mg/hr (09/05/17 0700)  . cefTAZidime (FORTAZ)  IV Stopped (09/05/17 0536)  . EPINEPHrine 4 mg in dextrose 5% 250 mL infusion (16 mcg/mL) 2 mcg/min (09/05/17 0700)  . feeding supplement (VITAL 1.5 CAL) 1,000 mL (09/05/17 0700)  . fentaNYL infusion INTRAVENOUS 400 mcg/hr (09/05/17 0700)  . furosemide (LASIX) infusion 5 mg/hr (09/05/17 0700)  . heparin 1,000 Units/hr (09/05/17 0700)  . insulin (NOVOLIN-R) infusion 7.1 Units/hr (09/05/17 0701)  . midazolam (VERSED) infusion 4 mg/hr (09/05/17 0700)  . milrinone 0.2 mcg/kg/min (09/05/17 0700)  . norepinephrine (LEVOPHED) Adult infusion 4.053 mcg/min (09/05/17 0700)  . potassium chloride 10 mEq (09/05/17 0715)  . vancomycin Stopped (09/04/17 1905)  . vasopressin (PITRESSIN) infusion - *FOR SHOCK* Stopped (09/02/17 0730)    PRN Medications: acetaminophen (TYLENOL) oral liquid 160 mg/5 mL, bisacodyl, fentaNYL, fentaNYL  (SUBLIMAZE) injection, metoprolol tartrate, midazolam, midazolam, ondansetron (ZOFRAN) IV, sodium chloride  flush, sodium chloride flush    Patient Profile   Mr Quevedo is a 66 year old with a history of DMII, smoker, CAD S/P PCI RCA 20 years ago, HTN, PAD, and hyperlipidemia admitted with CP and inferior MI with RV infarction.  He underwent LHC with IABP. After optimized, underwent CABG x4 centrimag for RV infarct and continued IABP.   Assessment/Plan   1. Cardiogenic shock: Primarily RV failure from inferior infarct with RV involvement.  Repeat echo on 9/24 showed vigorous LV systolic function with moderate RV dysfunction.  Now s/p CABG and Centrimag for RV support.  IABP removed 9/24 with good LV function.   - Centrimag support, at 2800 rpm this aim, weaned down again yesterday. CI 2.0-2.4 - Continue milrionone 0.2 mcg/kg/min  - Continue lasix 5 mg/hr for now.   CVP 11-12 this am on my personal check.  - Continue digoxin for rate control and RV support.    - Wean norepi as tolerated. On Epi at 2.  - Slow wean of RVAD. Tentatively plan for explant Monday.  2. CAD: Inferior MI with RV involvement at presentation, now s/p CABG.  He is getting ASA.   - Continue statin.No change. 3. Atrial fibrillation/flutter:  - Rate in 110-120s. Continue amiodarone gtt. 4. AKI:  - Creatinine relatively stable at 1.37 this am.  5. Diabetes: Difficult to control, appreciate help from diabetes coordinator. No change. .  6. Hyponatremia: 136 this am after several doses of tolvaptan this admission.  7. ID: Covering for HCAP with vancomycin/ceftazidime. CXR with left basilar atelectasis this morning.  No change.   8. Anemia - Hgb 8.9 this am. Appropriate rise after 2 uPRBCs 09/04/17.  - Stitch placed at centrimag insertion site. Oozing much improved.   Shirley Friar, PA-C  09/05/2017, 7:34 AM  Advanced Heart Failure Team Pager [phone] (M-F; 7a - 4p)  Please contact [facility] for  night-coverage after hours (4p -7a ) and weekends on [email or web address]  Patient seen with PA, agree with the above note. I made adjustments to the note to reflect my thoughts and performed a full exam.   RVAD speed decreased yesterday to 2800 rpm, flow 3.9 L/min currently. He remains on norepinephrine 4, epinephrine 2, and milrinone 0.2. CI from [place] 2-2.4, adequate.  - BP stable this morning, continue to wean norepinephrine.    - Possible return to OR Monday to remove RVAD.   CVP 11, weight down with good diuresis yesterday. With RV failure, will try not to drop CVP too aggressively (aim for mildly negative fluid balance today).  Will continue Lasix at 5 mg/hr today.   Patient is in atypical atrial flutter today, rate is around 120.  Continue amiodarone. Wean down on catecholeamines.   Loralie Champagne 09/05/2017 8:27 AM

## 2017-09-05 NOTE — Progress Notes (Signed)
8 Days Post-Op Procedure(s) (LRB): CORONARY ARTERY BYPASS GRAFTING (CABG) times four using left internal mammary artery and right greater saphenous leg vein using endoscope. (N/A) TRANSESOPHAGEAL ECHOCARDIOGRAM (TEE) (N/A) PLACEMENT OF CENTRIMAG VENTRICULAR ASSIST DEVICE (Right) Subjective: Status post CABG for completed RV infarct with postop RVAD and open sternum Rpm's on VAD slowly decreased 200 RPMs daily Blood pressure and CVP remained adequate, co-OX adequate Patient on IV heparin with maximum dose 1800 units per hour to avoid bleeding from chest, cannulation site. Heparin activity 0.20 Objective: Vital signs in last 24 hours: Temp:  [98.1 F (36.7 C)-100.8 F (38.2 C)] 100.8 F (38.2 C) (09/28 1015) Pulse Rate:  [81-130] 123 (09/28 1015) Cardiac Rhythm: Sinus tachycardia (09/28 0800) Resp:  [0-28] 24 (09/28 1015) BP: (78-154)/(49-77) 120/66 (09/28 1000) SpO2:  [94 %-99 %] 98 % (09/28 1015) Arterial Line BP: (73-150)/(39-74) 128/54 (09/28 1015) FiO2 (%):  [40 %] 40 % (09/28 0800) Weight:  [209 lb 14.1 oz (95.2 kg)] 209 lb 14.1 oz (95.2 kg) (09/28 0600)  Hemodynamic parameters for last 24 hours: PAP: (15-30)/(10-23) 27/21 CVP:  [3 mmHg-25 mmHg] 12 mmHg CO:  [4.8 L/min-5.3 L/min] 4.8 L/min CI:  [2.3 L/min/m2-2.4 L/min/m2] 2.3 L/min/m2  Intake/Output from previous day: 09/27 0701 - 09/28 0700 In: 5322.1 [I.V.:2570.4; Blood:661.7; NG/GT:1690; IV Piggyback:400] Out: 6905 [Urine:5085; Emesis/NG output:1550; Chest Tube:270] Intake/Output this shift: Total I/O In: 556.6 [I.V.:236.6; NG/GT:270; IV Piggyback:50] Out: 1065 [Urine:725; Emesis/NG output:300; Chest Tube:40]  Sedated on ventilator Breath sounds clear Mild edema Abdomen soft Sinus tachycardia versus atrial flutter Neuro intact during wake-up assessment  Lab Results:  Recent Labs  09/05/17 0346 09/05/17 0800  WBC 18.9* 17.7*  HGB 8.9* 8.5*  HCT 26.3* 25.3*  PLT 217 202   BMET:  Recent Labs   09/04/17 0324  09/04/17 1627 09/04/17 2159 09/05/17 0346  NA 133*  < > 136 136 137  K 4.2  < > 4.0 3.9 3.6  CL 92*  --  90*  --  93*  CO2 32  --   --   --  33*  GLUCOSE 166*  < > 128* 128* 90  BUN 47*  --  46*  --  55*  CREATININE 1.46*  --  1.40*  --  1.37*  CALCIUM 8.2*  --   --   --  8.6*  < > = values in this interval not displayed.  PT/INR:  Recent Labs  09/05/17 0346  LABPROT 14.8  INR 1.16   ABG    Component Value Date/Time   PHART 7.494 (H) 09/05/2017 0309   HCO3 34.6 (H) 09/05/2017 0309   TCO2 34 (H) 09/04/2017 1627   ACIDBASEDEF 2.0 08/31/2017 1211   O2SAT 54.4 09/05/2017 0312   CBG (last 3)   Recent Labs  09/05/17 0658 09/05/17 0807 09/05/17 0904  GLUCAP 118* 124* 119*    Assessment/Plan: S/P Procedure(s) (LRB): CORONARY ARTERY BYPASS GRAFTING (CABG) times four using left internal mammary artery and right greater saphenous leg vein using endoscope. (N/A) TRANSESOPHAGEAL ECHOCARDIOGRAM (TEE) (N/A) PLACEMENT OF CENTRIMAG VENTRICULAR ASSIST DEVICE (Right) Continue to slowly wean VAD speed  Reduce flow rate to a low limit of 2.8 L/m prior to pump removal scheduled for Monday Transfusion to  keep hemoglobin 8.5   LOS: 13 days    Kathlee Nations Trigt III 09/05/2017

## 2017-09-06 ENCOUNTER — Inpatient Hospital Stay (HOSPITAL_COMMUNITY): Payer: PPO

## 2017-09-06 DIAGNOSIS — I484 Atypical atrial flutter: Secondary | ICD-10-CM

## 2017-09-06 DIAGNOSIS — L8990 Pressure ulcer of unspecified site, unspecified stage: Secondary | ICD-10-CM | POA: Insufficient documentation

## 2017-09-06 LAB — COOXEMETRY PANEL
Carboxyhemoglobin: 1.1 % (ref 0.5–1.5)
Methemoglobin: 1.1 % (ref 0.0–1.5)
O2 Saturation: 60.5 %
Total hemoglobin: 7.7 g/dL — ABNORMAL LOW (ref 12.0–16.0)

## 2017-09-06 LAB — COMPREHENSIVE METABOLIC PANEL
ALT: 18 U/L (ref 17–63)
AST: 45 U/L — ABNORMAL HIGH (ref 15–41)
Albumin: 2.2 g/dL — ABNORMAL LOW (ref 3.5–5.0)
Alkaline Phosphatase: 59 U/L (ref 38–126)
Anion gap: 11 (ref 5–15)
BUN: 54 mg/dL — ABNORMAL HIGH (ref 6–20)
CO2: 31 mmol/L (ref 22–32)
Calcium: 8.1 mg/dL — ABNORMAL LOW (ref 8.9–10.3)
Chloride: 94 mmol/L — ABNORMAL LOW (ref 101–111)
Creatinine, Ser: 1.62 mg/dL — ABNORMAL HIGH (ref 0.61–1.24)
GFR calc Af Amer: 51 mL/min — ABNORMAL LOW (ref >60)
GFR calc non Af Amer: 44 mL/min — ABNORMAL LOW (ref >60)
Glucose, Bld: 91 mg/dL (ref 65–99)
Potassium: 3.8 mmol/L (ref 3.5–5.1)
Sodium: 136 mmol/L (ref 135–145)
Total Bilirubin: 0.6 mg/dL (ref 0.3–1.2)
Total Protein: 5.8 g/dL — ABNORMAL LOW (ref 6.5–8.1)

## 2017-09-06 LAB — GLUCOSE, CAPILLARY
GLUCOSE-CAPILLARY: 111 mg/dL — AB (ref 65–99)
GLUCOSE-CAPILLARY: 115 mg/dL — AB (ref 65–99)
GLUCOSE-CAPILLARY: 117 mg/dL — AB (ref 65–99)
GLUCOSE-CAPILLARY: 119 mg/dL — AB (ref 65–99)
GLUCOSE-CAPILLARY: 126 mg/dL — AB (ref 65–99)
GLUCOSE-CAPILLARY: 128 mg/dL — AB (ref 65–99)
GLUCOSE-CAPILLARY: 141 mg/dL — AB (ref 65–99)
GLUCOSE-CAPILLARY: 150 mg/dL — AB (ref 65–99)
GLUCOSE-CAPILLARY: 156 mg/dL — AB (ref 65–99)
GLUCOSE-CAPILLARY: 94 mg/dL (ref 65–99)
Glucose-Capillary: 100 mg/dL — ABNORMAL HIGH (ref 65–99)
Glucose-Capillary: 94 mg/dL (ref 65–99)
Glucose-Capillary: 93 mg/dL (ref 65–99)
Glucose-Capillary: 87 mg/dL (ref 65–99)
Glucose-Capillary: 113 mg/dL — ABNORMAL HIGH (ref 65–99)
Glucose-Capillary: 127 mg/dL — ABNORMAL HIGH (ref 65–99)
Glucose-Capillary: 170 mg/dL — ABNORMAL HIGH (ref 65–99)
Glucose-Capillary: 177 mg/dL — ABNORMAL HIGH (ref 65–99)
Glucose-Capillary: 189 mg/dL — ABNORMAL HIGH (ref 65–99)
Glucose-Capillary: 149 mg/dL — ABNORMAL HIGH (ref 65–99)
Glucose-Capillary: 145 mg/dL — ABNORMAL HIGH (ref 65–99)
Glucose-Capillary: 115 mg/dL — ABNORMAL HIGH (ref 65–99)
Glucose-Capillary: 120 mg/dL — ABNORMAL HIGH (ref 65–99)

## 2017-09-06 LAB — APTT: aPTT: 146 seconds — ABNORMAL HIGH (ref 24–36)

## 2017-09-06 LAB — BLOOD CULTURE ID PANEL (REFLEXED)

## 2017-09-06 LAB — POCT I-STAT, CHEM 8
BUN: 60 mg/dL — ABNORMAL HIGH (ref 6–20)
Calcium, Ion: 1.04 mmol/L — ABNORMAL LOW (ref 1.15–1.40)
Chloride: 93 mmol/L — ABNORMAL LOW (ref 101–111)
Creatinine, Ser: 1.7 mg/dL — ABNORMAL HIGH (ref 0.61–1.24)
Glucose, Bld: 175 mg/dL — ABNORMAL HIGH (ref 65–99)
HCT: 21 % — ABNORMAL LOW (ref 39.0–52.0)
Hemoglobin: 7.1 g/dL — ABNORMAL LOW (ref 13.0–17.0)
Potassium: 4.3 mmol/L (ref 3.5–5.1)
Sodium: 133 mmol/L — ABNORMAL LOW (ref 135–145)
TCO2: 29 mmol/L (ref 22–32)

## 2017-09-06 LAB — POCT I-STAT 3, ART BLOOD GAS (G3+)
Acid-Base Excess: 10 mmol/L — ABNORMAL HIGH (ref 0.0–2.0)
Bicarbonate: 34.5 mmol/L — ABNORMAL HIGH (ref 20.0–28.0)
O2 SAT: 95 %
PH ART: 7.465 — AB (ref 7.350–7.450)
PO2 ART: 75 mmHg — AB (ref 83.0–108.0)
Patient temperature: 37.8
TCO2: 36 mmol/L — ABNORMAL HIGH (ref 22–32)
pCO2 arterial: 48.3 mmHg — ABNORMAL HIGH (ref 32.0–48.0)

## 2017-09-06 LAB — CBC
HCT: 23.3 % — ABNORMAL LOW (ref 39.0–52.0)
HCT: 23.8 % — ABNORMAL LOW (ref 39.0–52.0)
HCT: 25.2 % — ABNORMAL LOW (ref 39.0–52.0)
HCT: 26.5 % — ABNORMAL LOW (ref 39.0–52.0)
Hemoglobin: 7.7 g/dL — ABNORMAL LOW (ref 13.0–17.0)
Hemoglobin: 7.7 g/dL — ABNORMAL LOW (ref 13.0–17.0)
Hemoglobin: 8.1 g/dL — ABNORMAL LOW (ref 13.0–17.0)
Hemoglobin: 9 g/dL — ABNORMAL LOW (ref 13.0–17.0)
MCH: 28.1 pg (ref 26.0–34.0)
MCH: 28.2 pg (ref 26.0–34.0)
MCH: 28.8 pg (ref 26.0–34.0)
MCH: 29.4 pg (ref 26.0–34.0)
MCHC: 32.1 g/dL (ref 30.0–36.0)
MCHC: 32.4 g/dL (ref 30.0–36.0)
MCHC: 33 g/dL (ref 30.0–36.0)
MCHC: 34 g/dL (ref 30.0–36.0)
MCV: 86.6 fL (ref 78.0–100.0)
MCV: 86.9 fL (ref 78.0–100.0)
MCV: 87.3 fL (ref 78.0–100.0)
MCV: 87.8 fL (ref 78.0–100.0)
Platelets: 175 10*3/uL (ref 150–400)
Platelets: 181 10*3/uL (ref 150–400)
Platelets: 191 10*3/uL (ref 150–400)
Platelets: 206 10*3/uL (ref 150–400)
RBC: 2.67 MIL/uL — ABNORMAL LOW (ref 4.22–5.81)
RBC: 2.74 MIL/uL — ABNORMAL LOW (ref 4.22–5.81)
RBC: 2.87 MIL/uL — ABNORMAL LOW (ref 4.22–5.81)
RBC: 3.06 MIL/uL — ABNORMAL LOW (ref 4.22–5.81)
RDW: 15.8 % — ABNORMAL HIGH (ref 11.5–15.5)
RDW: 15.8 % — ABNORMAL HIGH (ref 11.5–15.5)
RDW: 16.2 % — ABNORMAL HIGH (ref 11.5–15.5)
RDW: 16.4 % — ABNORMAL HIGH (ref 11.5–15.5)
WBC: 16.9 10*3/uL — ABNORMAL HIGH (ref 4.0–10.5)
WBC: 17.3 10*3/uL — ABNORMAL HIGH (ref 4.0–10.5)
WBC: 17.6 10*3/uL — ABNORMAL HIGH (ref 4.0–10.5)
WBC: 17.9 10*3/uL — ABNORMAL HIGH (ref 4.0–10.5)

## 2017-09-06 LAB — CULTURE, BLOOD (ROUTINE X 2)
Culture: NO GROWTH
Culture: NO GROWTH
Special Requests: ADEQUATE
Special Requests: ADEQUATE

## 2017-09-06 LAB — PROTIME-INR
INR: 1.35
Prothrombin Time: 16.5 seconds — ABNORMAL HIGH (ref 11.4–15.2)

## 2017-09-06 LAB — POCT ACTIVATED CLOTTING TIME
ACTIVATED CLOTTING TIME: 175 s
ACTIVATED CLOTTING TIME: 186 s
ACTIVATED CLOTTING TIME: 186 s
ACTIVATED CLOTTING TIME: 175 s
Activated Clotting Time: 158 seconds
Activated Clotting Time: 164 seconds
Activated Clotting Time: 164 seconds
Activated Clotting Time: 169 seconds
Activated Clotting Time: 175 seconds
Activated Clotting Time: 175 seconds
Activated Clotting Time: 180 seconds
Activated Clotting Time: 180 seconds
Activated Clotting Time: 180 seconds
Activated Clotting Time: 180 seconds
Activated Clotting Time: 180 seconds
Activated Clotting Time: 180 seconds
Activated Clotting Time: 186 seconds
Activated Clotting Time: 191 seconds
Activated Clotting Time: 191 seconds

## 2017-09-06 LAB — LACTATE DEHYDROGENASE: LDH: 343 U/L — ABNORMAL HIGH (ref 98–192)

## 2017-09-06 LAB — POCT I-STAT 4, (NA,K, GLUC, HGB,HCT)
GLUCOSE: 127 mg/dL — AB (ref 65–99)
HEMATOCRIT: 23 % — AB (ref 39.0–52.0)
HEMOGLOBIN: 7.8 g/dL — AB (ref 13.0–17.0)
POTASSIUM: 4.3 mmol/L (ref 3.5–5.1)
SODIUM: 136 mmol/L (ref 135–145)

## 2017-09-06 LAB — CALCIUM, IONIZED: Calcium, Ionized, Serum: 5 mg/dL (ref 4.5–5.6)

## 2017-09-06 LAB — PREPARE RBC (CROSSMATCH)

## 2017-09-06 LAB — HEPARIN LEVEL (UNFRACTIONATED): Heparin Unfractionated: 0.75 IU/mL — ABNORMAL HIGH (ref 0.30–0.70)

## 2017-09-06 LAB — VANCOMYCIN, TROUGH: Vancomycin Tr: 18 ug/mL (ref 15–20)

## 2017-09-06 MED ORDER — SODIUM CHLORIDE 0.9 % IV SOLN: Freq: Once | INTRAVENOUS | Status: DC

## 2017-09-06 NOTE — Progress Notes (Signed)
PULMONARY / CRITICAL CARE MEDICINE   Name: Clinton Harrington MRN: [id number] DOB: 30-Nov-1951    ADMISSION DATE:  08/23/2017 CONSULTATION DATE:  08/30/17  REFERRING MD: Dr Cornelius Moras  CHIEF COMPLAINT:  Acute resp failure, hypoxia, hypercapnia  brief:  Patient is a 66 y/o Caucasian M with hx CADs/p BMS to RCA in 1999 (due to an inferior STEMI), significant PVD, HTN, Type 2 DM (non-insulin dependent), HLP, Tobacco use comes in today complaining of substernal chest pain on admission.He continued to have active CP and the patient was taken to cath lab for further intervention. FOund with RV infacrct and required emergent bypass surgery. Remained in cardiogenic shock requiring balloon pump and placement of RVAD device. Remained on vent with acute resp failure, skin closed but sternum is not closed.He has developed worsening acidosis and hypoxia with bilateral int infiltrates. He has been pos 4 liters last 2 4 hours. He remains on levophed and milrinone. Asked to assist in critical care.  CULTURES: 9/22 sputum>>>ng 9/24>> Sputum>>ng 9/24 Blood>> 9/24 Urine>>ng  ANTIBIOTICS: zinacef >>>9/22- 9/24 ceftaz 9/22>>> Vanc>>>9/24>>  SIGNIFICANT EVENTS: 08/23/2017 - admit 9/22 - intubated Cath cabg RV assist device 9/23 - On epi/levo/RVAD/vent.  CI 1.8 despite all interventions 9/24>> Epi, Levo, milrinine,amio,insulin gtt, Lasix gtt, Heparin, Fent, Versed 9/26 - Remains sedated on fent/ versed drips RVAD flows down to 3200 Good UO on lasix gtt @ 15 9/27 - 40% fio2, peep 8. RPM on RVAD turned down. Stil on levophed, milrinone, epi ggtt and heparin.admio gtt. Making urine. Has chest draines. Good urine outoput. Heparin on hold briefly before resumptions due to bleed in groin site. Getting 2 UI PRBC  SUBJECTIVE/OVERNIGHT/INTERVAL HX Remains on multiple pressors support with Epi and Levophed. Levo down this am  Remains on Amio/Milrinone /Insulin /fent/versed/hep drips  Appears comfortable on vent   Remains in Neg balance,  -8L , CVP 15  CXR stable  Hiccups   VITAL SIGNS: BP (!) 108/56   Pulse (!) 115   Temp 100.2 F (37.9 C)   Resp 20   Ht  (1.803 m)   Wt 208 lb 1.8 oz (94.4 kg)   SpO2 100%   BMI 29.03 kg/m   HEMODYNAMICS: PAP: (22-32)/(15-27) 28/22 CVP:  [8 mmHg-19 mmHg] 16 mmHg CO:  [4.8 L/min-5.9 L/min] 5.8 L/min CI:  [2.3 L/min/m2-2.7 L/min/m2] 2.7 L/min/m2  VENTILATOR SETTINGS: Vent Mode: PRVC FiO2 (%):  [40 %] 40 % Set Rate:  [24 bmp] 24 bmp Vt Set:  [600 mL] 600 mL PEEP:  [6 cmH20] 6 cmH20 Plateau Pressure:  [0.21 cmH20-23 cmH20] 0.21 cmH20  INTAKE / OUTPUT: I/O last 3 completed shifts: In: 6986.1 [I.V.:3536.1; Blood:310; Other:30; NG/GT:2410; IV Piggyback:700] Out: 8460 [Urine:5500; Emesis/NG output:2300; Chest Tube:660]  PHYSICAL EXAMINATION: General:  Critically ill male on vent  HEENT: ETT , right cortrak Neuro: sedated on vent , RAAS -1  CV: RRR, RVAD ,  PULM: Event /non labored on MV , bilateral CT to sxn , no airleak  GI: soft, obese, BS +  Extremities: warm/dry , no edema  Skin: Mid sternal dressing    PULMONARY  Recent Labs Lab 09/02/17 1606  09/03/17 0320 09/03/17 1551  09/04/17 0340 09/04/17 1627 09/05/17 0309 09/05/17 0312 09/05/17 1558 09/06/17 0408 09/06/17 0540  PHART 7.432  --  7.371  --   --  7.441  --  7.494*  --   --  7.465*  --   PCO2ART 46.3  --  54.9*  --   --  51.3*  --  45.4  --   --  48.3*  --   PO2ART 94.0  --  72.1*  --   --  108  --  84.2  --   --  75.0*  --   HCO3 31.0*  --  31.0*  --   --  34.3*  --  34.6*  --   --  34.5*  --   TCO2 32  --   --  30  --   --  34*  --   --  30 36*  --   O2SAT 98.0  < > 93.8  --   < > 97.9  --  97.1 54.4  --  95.0 60.5  < > = values in this interval not displayed.  CBC  Recent Labs Lab 09/05/17 1415  09/05/17 2033 09/05/17 2206 09/06/17 0500  HGB 8.9*  < > 8.5* 7.8* 8.1*  HCT 27.2*  < > 26.3* 23.0* 25.2*  WBC 21.1*  --  20.2*  --  17.9*  PLT 212  --  221  --   206  < > = values in this interval not displayed.  COAGULATION  Recent Labs Lab 09/02/17 0354 09/03/17 0255 09/04/17 0324 09/05/17 0346 09/06/17 0500  INR 1.19 1.11 1.15 1.16 1.35    CARDIAC  No results for input(s): TROPONINI in the last 168 hours. No results for input(s): PROBNP in the last 168 hours.   CHEMISTRY  Recent Labs Lab 08/30/17 2000  09/01/17 0220  09/02/17 1104  09/03/17 0255  09/03/17 0800  09/04/17 0324  09/04/17 1627  09/05/17 0346 09/05/17 1018 09/05/17 1558 09/05/17 2206 09/06/17 0500  NA 128*  < > 121*  < > 123*  < > 126*  < >  --   < > 133*  < > 136  < > 137 136 134* 135 136  K 3.7  < > 3.7  < > 3.1*  < > 4.0  < >  --   < > 4.2  < > 4.0  < > 3.6 3.9 4.5 4.0 3.8  CL 96*  < > 84*  < > 82*  < > 87*  --   --   < > 92*  --  90*  --  93*  --  93*  --  94*  CO2 24  < > 27  < > 29  --  29  --   --   --  32  --   --   --  33*  --   --   --  31  GLUCOSE 156*  < > 391*  < > 417*  < > 433*  < >  --   < > 166*  < > 128*  < > 90 151* 175* 108* 91  BUN 19  < > 19  < > 28*  < > 35*  --   --   < > 47*  --  46*  --  55*  --  52*  --  54*  CREATININE 1.03  < > 1.19  < > 1.15  < > 1.39*  --   --   < > 1.46*  --  1.40*  --  1.37*  --  1.70*  --  1.62*  CALCIUM 7.3*  < > 7.6*  < > 7.8*  --  7.9*  --   --   --  8.2*  --   --   --  8.6*  --   --   --  8.1*  MG 1.6*  --  1.5*  --   --   --   --   --  1.7  --   --   --   --   --  1.7  --   --   --   --   PHOS 2.7  --  1.7*  --   --   --   --   --   --   --   --   --   --   --   --   --   --   --   --   < > = values in this interval not displayed. Estimated Creatinine Clearance: 54.7 mL/min (A) (by C-G formula based on SCr of 1.62 mg/dL (H)).   LIVER  Recent Labs Lab 09/01/17 0220  09/02/17 0354 09/03/17 0255 09/04/17 0324 09/05/17 0346 09/06/17 0500  AST 45*  --  42* 45* 41  --  45*  ALT 17  --  16* 19 20  --  18  ALKPHOS 71  --  81 97 82  --  59  BILITOT 0.8  --  1.3* 0.9 0.7  --  0.6  PROT 5.1*  --  5.3*  5.6* 5.3*  --  5.8*  ALBUMIN 2.1*  --  2.1* 2.1* 1.9*  --  2.2*  INR  --   < > 1.19 1.11 1.15 1.16 1.35  < > = values in this interval not displayed.   INFECTIOUS No results for input(s): LATICACIDVEN, PROCALCITON in the last 168 hours.   ENDOCRINE CBG (last 3)   Recent Labs  09/06/17 0458 09/06/17 0600 09/06/17 0657  GLUCAP 87 113* 111*         IMAGING x48h  - image(s) personally visualized  -   highlighted in bold Dg Chest Port 1 View  Result Date: 09/05/2017 CLINICAL DATA: History of coronary bypass graft EXAM: PORTABLE CHEST 1 VIEW COMPARISON:  09/04/2017 FINDINGS: Swan-Ganz catheter, endotracheal tube, nasogastric catheter and feeding catheter are again seen and stable. Bilateral chest tubes are seen and stable. Cannula identified bilaterally consistent with the external bypass machine. Right-sided PICC line is noted with the catheter tip at the cavoatrial junction. No pneumothorax is seen. Left pleural effusion is noted and stable in appearance. Left basilar atelectasis is noted stable from the prior exam. No new focal abnormality is noted. IMPRESSION: Tubes and lines as described above stable from the previous exam. Left basilar atelectasis and small effusion unchanged from the prior exam. Electronically Signed   By: Alcide Clever M.D.   On: 09/05/2017 08:01   ASSESSMENT / PLAN:  PULMONARY A: 09/06/2017 -> acute resp failure due to cardiogenic shock  P:   Full vent support. WEan as able .  Daily CXR and ABG  CARDIOVASCULAR A:   09/06/2017 -> cardiogenic shock  P:  Per cards and cvts  RENAL  Intake/Output Summary (Last 24 hours) at 09/06/17 0750 Last data filed at 09/06/17 0700  Gross per 24 hour  Intake          4470.13 ml  Output             5230 ml  Net          -759.87 ml    Recent Labs Lab 09/04/17 0324 09/04/17 1627 09/05/17 0346 09/05/17 1558 09/06/17 0500  CREATININE 1.46* 1.40* 1.37* 1.70* 1.62*     A:    09/06/2017 -> Acute Kidney  failure  Remains in  neg balance with lasix   P:   Monitor renal function Trend UOP Replace electrolytes as needed   GASTROINTESTINAL A:   Malnutrition , tolerating TF   P:   TF PPI  HEMATOLOGIC  Recent Labs Lab 09/05/17 1415  09/05/17 2033 09/05/17 2206 09/06/17 0500  HGB 8.9*  < > 8.5* 7.8* 8.1*  HCT 27.2*  < > 26.3* 23.0* 25.2*  WBC 21.1*  --  20.2*  --  17.9*  PLT 212  --  221  --  206  < > = values in this interval not displayed.  A:   #RBC: anemia criticall illness #Platelet *normal #WBC high  P:  - trend CBC - PRBC for hgb </= 8-8.5gm%      INFECTIOUS No results for input(s): PROCALCITON in the last 168 hours.  Results for orders placed or performed during the hospital encounter of 08/23/17  MRSA PCR Screening     Status: None   Collection Time: 08/23/17  9:00 PM  Result Value Ref Range Status   MRSA by PCR NEGATIVE NEGATIVE Final    Comment:        The GeneXpert MRSA Assay (FDA approved for NASAL specimens only), is one component of a comprehensive MRSA colonization surveillance program. It is not intended to diagnose MRSA infection nor to guide or monitor treatment for MRSA infections.   Surgical pcr screen     Status: Abnormal   Collection Time: 08/24/17  9:54 AM  Result Value Ref Range Status   MRSA, PCR NEGATIVE NEGATIVE Final   Staphylococcus aureus POSITIVE (A) NEGATIVE Final    Comment: (NOTE) The Xpert SA Assay (FDA approved for NASAL specimens in patients 13 years of age and older), is one component of a comprehensive surveillance program. It is not intended to diagnose infection nor to guide or monitor treatment.   Culture, respiratory (NON-Expectorated)     Status: None   Collection Time: 08/28/17  3:42 PM  Result Value Ref Range Status   Specimen Description TRACHEAL ASPIRATE  Final   Special Requests NONE  Final   Gram Stain   Final    RARE SQUAMOUS EPITHELIAL CELLS PRESENT NO WBC SEEN FEW GRAM POSITIVE COCCI     Culture Consistent with normal respiratory flora.  Final   Report Status 08/29/2017 FINAL  Final  Culture, respiratory (NON-Expectorated)     Status: None   Collection Time: 08/30/17 11:27 AM  Result Value Ref Range Status   Specimen Description TRACHEAL ASPIRATE  Final   Special Requests NONE  Final   Gram Stain NO WBC SEEN NO ORGANISMS SEEN   Final   Culture Consistent with normal respiratory flora.  Final   Report Status 09/01/2017 FINAL  Final  Urine Culture     Status: None   Collection Time: 09/01/17  8:56 AM  Result Value Ref Range Status   Specimen Description URINE, CATHETERIZED  Final   Special Requests Normal  Final   Culture NO GROWTH  Final   Report Status 09/02/2017 FINAL  Final  Culture, respiratory (NON-Expectorated)     Status: None   Collection Time: 09/01/17 11:40 AM  Result Value Ref Range Status   Specimen Description TRACHEAL ASPIRATE  Final   Special Requests Normal  Final   Gram Stain   Final    ABUNDANT WBC PRESENT,BOTH PMN AND MONONUCLEAR NO ORGANISMS SEEN    Culture Consistent with normal respiratory flora.  Final   Report Status 09/03/2017 FINAL  Final  Culture, blood (Routine  X 2) w Reflex to ID Panel     Status: None (Preliminary result)   Collection Time: 09/01/17  3:55 PM  Result Value Ref Range Status   Specimen Description BLOOD LEFT HAND  Final   Special Requests   Final    BOTTLES DRAWN AEROBIC ONLY Blood Culture adequate volume   Culture NO GROWTH 4 DAYS  Final   Report Status PENDING  Incomplete  Culture, blood (Routine X 2) w Reflex to ID Panel     Status: None (Preliminary result)   Collection Time: 09/01/17  3:55 PM  Result Value Ref Range Status   Specimen Description BLOOD LEFT HAND  Final   Special Requests IN PEDIATRIC BOTTLE Blood Culture adequate volume  Final   Culture NO GROWTH 4 DAYS  Final   Report Status PENDING  Incomplete    A:   Possible PNA  P:   Cont on Vanc /Zosyn  Follow cx data    Anti-infectives     Start     Dose/Rate Route Frequency Ordered Stop   09/05/17 1230  piperacillin-tazobactam (ZOSYN) IVPB 3.375 g     3.375 g 12.5 mL/hr over 240 Minutes Intravenous Every 8 hours 09/05/17 1143     09/03/17 1800  vancomycin (VANCOCIN) 1,500 mg in sodium chloride 0.9 % 250 mL IVPB     1,500 mg 125 mL/hr over 120 Minutes Intravenous Every 24 hours 09/03/17 0943     09/02/17 2200  vancomycin (VANCOCIN) 1,000 mg in sodium chloride 0.9 % 100 mL IVPB  Status:  Discontinued     1,000 mg 100 mL/hr over 60 Minutes Intravenous Every 12 hours 09/02/17 1323 09/03/17 0942   09/01/17 2200  vancomycin (VANCOCIN) IVPB 1000 mg/200 mL premix  Status:  Discontinued     1,000 mg 200 mL/hr over 60 Minutes Intravenous Every 12 hours 09/01/17 1118 09/01/17 1118   09/01/17 2200  vancomycin (VANCOCIN) 1,000 mg in sodium chloride 0.9 % 250 mL IVPB  Status:  Discontinued     1,000 mg 250 mL/hr over 60 Minutes Intravenous Every 12 hours 09/01/17 1122 09/01/17 1454   09/01/17 2200  vancomycin (VANCOCIN) IVPB 1000 mg/200 mL premix  Status:  Discontinued     1,000 mg 200 mL/hr over 60 Minutes Intravenous Every 12 hours 09/01/17 1454 09/02/17 1323   08/30/17 1400  cefTAZidime (FORTAZ) 1 g in dextrose 5 % 50 mL IVPB  Status:  Discontinued     1 g 100 mL/hr over 30 Minutes Intravenous Every 8 hours 08/30/17 1036 09/05/17 1143   08/29/17 0830  vancomycin (VANCOCIN) IVPB 1000 mg/200 mL premix  Status:  Discontinued     1,000 mg 200 mL/hr over 60 Minutes Intravenous Every 12 hours 08/28/17 1545 09/01/17 1117   08/28/17 2030  vancomycin (VANCOCIN) IVPB 1000 mg/200 mL premix     1,000 mg 200 mL/hr over 60 Minutes Intravenous  Once 08/28/17 1502 08/28/17 2300   08/28/17 2000  cefUROXime (ZINACEF) 1.5 g in dextrose 5 % 50 mL IVPB  Status:  Discontinued     1.5 g 100 mL/hr over 30 Minutes Intravenous Every 12 hours 08/28/17 1502 08/30/17 1036   08/28/17 0400  vancomycin (VANCOCIN) 1,500 mg in sodium chloride 0.9 % 250 mL IVPB      1,500 mg 125 mL/hr over 120 Minutes Intravenous To Surgery 08/27/17 1000 08/28/17 1010   08/28/17 0400  cefUROXime (ZINACEF) 1.5 g in dextrose 5 % 50 mL IVPB     1.5 g 100 mL/hr over  30 Minutes Intravenous To Surgery 08/27/17 1000 08/28/17 1230   08/28/17 0400  cefUROXime (ZINACEF) 750 mg in dextrose 5 % 50 mL IVPB  Status:  Discontinued     750 mg 100 mL/hr over 30 Minutes Intravenous To Surgery 08/27/17 1000 08/28/17 1500       ENDOCRINE A:   hyperglyucemia improved after reducing stress dose steroids P:   ICU hyperglycemia protocol continue hydrocortisone 50 mg daily   NEUROLOGIC A:   #Current: follows commands on wua per nursing  P:   RASS goal: 0 to -3 fent and versed gtt to continue   FAMILY  - Updates: 09/06/2017 --> none at bedside   - Inter-disciplinary family meet or Palliative Care meeting due by:  Ongoing.  Tammy Parrett NP-C  [facility]  [phone]   Attending Note:  I have examined patient, reviewed labs, studies and notes. I have discussed the case with T Parrett, and I agree with the data and plans as amended above. 66 year old man with coronary disease, hypertension, diabetes, tobacco use. He underwent emergent bypass following RV infarct. An RVAD device was left in place. His sternal wound is not yet closed. On my evaluation today he is sedated, intubated, critically ill on multiple pressors. Lungs are coarse bilaterally. Heart has mechanical noises. Abdomen and chest significant for cannulation tubing. He is net -8 L. We will plan to continue current antibiotics for possible HCAP. Agree with volume removal. His RVAD support is being weaned and he may be able to have the device removed at the beginning of next week. We will continue current ventilator support pending return to the operating room. Independent critical care time is 33 minutes.   Levy Pupa, MD, PhD 09/06/2017, 12:53 PM [facility] [phone] or  if no answer [phone]

## 2017-09-06 NOTE — Progress Notes (Signed)
Patient ID: Clinton Harrington, male   DOB: [birth year], 66 y.o.   MRN: [id number]     Advanced Heart Failure Rounding Note   Subjective:    Patient remains intubated and sedated.  IABP removed 9/24.  Centrimag RVAD remains in place.  BP soft. Pressors increased slightly overnight. On Epi 3, milrionone 0.2, Norepi 8, and amio 30 mg/hr. Co-ox 61%   Remains intubated and sedated. Will respond to pain.   He remains in atrial/flutter in 110-120s .Got 1u RBCs this am.    Luiz Blare numbers: CVP 12  PA 27/20 CO 5.0 CI 2.3  Centrimag interrogated personally 2600 rpm Flow 3.4 L/min  Echo (09/01/17): EF 65-70% with moderate LV hypertrophy, mildly dilated RV with moderately decreased systolic function, IVC dilated.   Objective:   Weight Range: 94.4 kg (208 lb 1.8 oz) Body mass index is 29.03 kg/m.   Vital Signs:   Temp:  [99.3 F (37.4 C)-100.9 F (38.3 C)] 99.3 F (37.4 C) (09/29 1030) Pulse Rate:  [108-127] 115 (09/29 1030) Resp:  [0-30] 16 (09/29 1030) BP: (88-131)/(48-68) 100/51 (09/29 1030) SpO2:  [94 %-100 %] 100 % (09/29 1030) Arterial Line BP: (89-148)/(36-66) 106/53 (09/29 1030) FiO2 (%):  [40 %] 40 % (09/29 0721) Weight:  [94.4 kg (208 lb 1.8 oz)] 94.4 kg (208 lb 1.8 oz) (09/29 0500) Last BM Date: 08/30/17  Weight change: Filed Weights   09/04/17 0500 09/05/17 0600 09/06/17 0500  Weight: 95.8 kg (211 lb 3.2 oz) 95.2 kg (209 lb 14.1 oz) 94.4 kg (208 lb 1.8 oz)    Intake/Output:   Intake/Output Summary (Last 24 hours) at 09/06/17 1043 Last data filed at 09/06/17 1030  Gross per 24 hour  Intake          4728.83 ml  Output             4530 ml  Net           198.83 ml      Physical Exam    General:  Intubated sedate. Responds to pain.  HEENT: normal + ETT with coffee grounds Neck: supple. RIJ swan Carotids 2+ bilat; no bruits. No lymphadenopathy or thryomegaly appreciated. Cor: chest dressing c/d/i. Centrimag and CT in place Tachy regular. RVAD hum  Lungs:  clear Abdomen: soft, nontender, nondistended. No hepatosplenomegaly. No bruits or masses. Hypoactive bowel sounds. Extremities: no cyanosis, clubbing, rash, 1+ edema Neuro: intubated sedate   Telemetry   Atypical atrial flutter, rate 110-120. Personally reviewed.   EKG    9/25 (personally reviewed): Atypical flutter, RBBB  Labs    CBC  Recent Labs  09/05/17 2033  09/06/17 0500 09/06/17 1013  WBC 20.2*  --  17.9*  --   HGB 8.5*  < > 8.1* 7.8*  HCT 26.3*  < > 25.2* 23.0*  MCV 87.1  --  87.8  --   PLT 221  --  206  --   < > = values in this interval not displayed. Basic Metabolic Panel  Recent Labs  09/05/17 0346  09/05/17 1558  09/06/17 0500 09/06/17 1013  NA 137  < > 134*  < > 136 136  K 3.6  < > 4.5  < > 3.8 4.3  CL 93*  --  93*  --  94*  --   CO2 33*  --   --   --  31  --   GLUCOSE 90  < > 175*  < > 91 127*  BUN 55*  --  52*  --  54*  --   CREATININE 1.37*  --  1.70*  --  1.62*  --   CALCIUM 8.6*  --   --   --  8.1*  --   MG 1.7  --   --   --   --   --   < > = values in this interval not displayed. Liver Function Tests  Recent Labs  09/04/17 0324 09/06/17 0500  AST 41 45*  ALT 20 18  ALKPHOS 82 59  BILITOT 0.7 0.6  PROT 5.3* 5.8*  ALBUMIN 1.9* 2.2*   No results for input(s): LIPASE, AMYLASE in the last 72 hours. Cardiac Enzymes No results for input(s): CKTOTAL, CKMB, CKMBINDEX, TROPONINI in the last 72 hours.  BNP: BNP (last 3 results)  Recent Labs  08/23/17 1927  BNP 200.0*    ProBNP (last 3 results) No results for input(s): PROBNP in the last 8760 hours.   D-Dimer No results for input(s): DDIMER in the last 72 hours. Hemoglobin A1C No results for input(s): HGBA1C in the last 72 hours. Fasting Lipid Panel No results for input(s): CHOL, HDL, LDLCALC, TRIG, CHOLHDL, LDLDIRECT in the last 72 hours. Thyroid Function Tests No results for input(s): TSH, T4TOTAL, T3FREE, THYROIDAB in the last 72 hours.  Invalid input(s): FREET3  Other  results:   Imaging    Dg Chest Port 1 View  Result Date: 09/06/2017 CLINICAL DATA:  Status post CABG. EXAM: PORTABLE CHEST 1 VIEW COMPARISON:  09/05/2017 FINDINGS: Sequelae of CABG are again identified. Endotracheal tube, right jugular Swan-Ganz catheter, mediastinal drain, and bilateral chest tubes are unchanged. A right PICC remains in place with tip partially obscured by overlying support devices but likely at the cavoatrial junction or in the high right atrium. Cannulae for external ventricular assist device remain in place. Left pleural effusion and patchy left basilar airspace opacity are unchanged. No pneumothorax is identified. IMPRESSION: 1. Support devices as above. 2. Unchanged left pleural effusion and left basilar opacity, likely atelectasis. Electronically Signed   By: Logan Bores M.D.   On: 09/06/2017 08:20     Medications:     Scheduled Medications: . aspirin EC  325 mg Oral Daily   Or  . aspirin  324 mg Per Tube Daily  . atorvastatin  80 mg Per Tube q1800  . brimonidine  1 drop Right Eye BID  . chlorhexidine gluconate (MEDLINE KIT)  15 mL Mouth Rinse BID  . Chlorhexidine Gluconate Cloth  6 each Topical Q0600  . digoxin  0.125 mg Oral Daily  . feeding supplement (PRO-STAT SUGAR FREE 64)  30 mL Per Tube TID  . furosemide  20 mg Intravenous BID  . hydrocortisone sod succinate (SOLU-CORTEF) inj  50 mg Intravenous Daily  . insulin detemir  55 Units Subcutaneous BID  . mouth rinse  15 mL Mouth Rinse 10 times per day  . mupirocin ointment  1 application Topical BID  . pantoprazole (PROTONIX) IV  40 mg Intravenous Q12H  . sodium chloride flush  10-40 mL Intracatheter Q12H  . sodium chloride flush  3 mL Intravenous Q12H    Infusions: . sodium chloride    . sodium chloride 10 mL (09/01/17 0800)  . sodium chloride 10 mL/hr at 09/06/17 0800  . sodium chloride 10 mL/hr at 09/06/17 0800  . amiodarone 30 mg/hr (09/06/17 0800)  . chlorproMAZINE (THORAZINE) IV 12.5 mg  (09/06/17 0748)  . EPINEPHrine 4 mg in dextrose 5% 250 mL infusion (16 mcg/mL) 3 mcg/min (  09/06/17 0800)  . feeding supplement (VITAL 1.5 CAL) 1,000 mL (09/06/17 0800)  . fentaNYL infusion INTRAVENOUS 400 mcg/hr (09/06/17 0800)  . heparin 1,800 Units/hr (09/06/17 0900)  . insulin (NOVOLIN-R) infusion 4 mL/hr at 09/06/17 1000  . midazolam (VERSED) infusion 6 mg/hr (09/06/17 0800)  . milrinone 0.2 mcg/kg/min (09/06/17 0800)  . norepinephrine (LEVOPHED) Adult infusion 8 mcg/min (09/06/17 0915)  . piperacillin-tazobactam (ZOSYN)  IV Stopped (09/06/17 1000)  . vancomycin Stopped (09/05/17 1926)  . vasopressin (PITRESSIN) infusion - *FOR SHOCK* Stopped (09/02/17 0730)    PRN Medications: acetaminophen (TYLENOL) oral liquid 160 mg/5 mL, bisacodyl, chlorproMAZINE (THORAZINE) IV, fentaNYL, fentaNYL (SUBLIMAZE) injection, metoprolol tartrate, midazolam, midazolam, ondansetron (ZOFRAN) IV, sodium chloride flush, sodium chloride flush    Patient Profile   Mr Waterhouse is a 66 year old with a history of DMII, smoker, CAD S/P PCI RCA 20 years ago, HTN, PAD, and hyperlipidemia admitted with CP and inferior MI with RV infarction.  He underwent LHC with IABP. After optimized, underwent CABG x4 centrimag for RV infarct on 9/20 and continued IABP.   Assessment/Plan   1. Post-cardiotomy cardiogenic shock: Primarily RV failure from inferior infarct with RV involvement.  Repeat echo on 9/24 showed vigorous LV systolic function with moderate RV dysfunction.  Now s/p CABG and Centrimag for RV support on 9/20.  IABP removed 9/24 with good LV function.   - Remains critically ill - Centrimag support, at 2600 rpm this aim. CO stable. Will wean to 2400. - BP soft. Titrating pressors as needed.  - On low dose lasix. Keep CVP 10-12 for RV support - Will plan DC-CV to help with cardiac output. D/w Drs. Bartle and Mclean  - Plan for explant Monday.  2. CAD: Inferior MI with RV involvement at presentation, now s/p CABG.   He is getting ASA.   - Continue statin.No change. 3. Atrial fibrillation/flutter:  - Rate in 110-120s. Continue amiodarone gtt. - With increasing pressor requirement will plan DC-CV today.  4. AKI:  - Creatinine up slightly this am. Continue to folloe 5. Diabetes: -Difficult to control, appreciate help from diabetes coordinator. No change. .  6. Hyponatremia:  - Stable at 136 this am after several doses of tolvaptan this admission.  7. ID:  -Covering for HCAP with vancomycin/ceftazidime. CXR with left basilar opacity this am - unchanged  (viewed personally)  8. Anemia - Received 2 uPRBCs 09/04/17.  - Hgb down to 7.7 this am. Received 1u RBCs this am  9. Coffee grounds in NGT - Continue IV protonix  CRITICAL CARE Performed by: Glori Bickers  Total critical care time: 35 minutes  Critical care time was exclusive of separately billable procedures and treating other patients.  Critical care was necessary to treat or prevent imminent or life-threatening deterioration.  Critical care was time spent personally by me (independent of midlevel providers or residents) on the following activities: development of treatment plan with patient and/or surrogate as well as nursing, discussions with consultants, evaluation of patient's response to treatment, examination of patient, obtaining history from patient or surrogate, ordering and performing treatments and interventions, ordering and review of laboratory studies, ordering and review of radiographic studies, pulse oximetry and re-evaluation of patient's condition.    Glori Bickers, MD  09/06/2017, 10:43 AM  Advanced Heart Failure Team Pager [phone] (M-F; 7a - 4p)  Please contact [facility] for night-coverage after hours (4p -7a ) and weekends on [email or web address]

## 2017-09-06 NOTE — Progress Notes (Signed)
RT called to bedside for cardioversion. FiO2 increased to 100% for procedure. Pt stable throughout with no complications. RT will continue to monitor.

## 2017-09-06 NOTE — Progress Notes (Signed)
Patient ID: Clinton Harrington, male   DOB: 07-10-1951, 66 y.o.   MRN: [id number] [facility] DAILY ICU PROGRESS NOTE                   [address]            [place], [address]          [phone]   9 Days Post-Op Procedure(s) (LRB): CORONARY ARTERY BYPASS GRAFTING (CABG) times four using left internal mammary artery and right greater saphenous leg vein using endoscope. (N/A) TRANSESOPHAGEAL ECHOCARDIOGRAM (TEE) (N/A) PLACEMENT OF CENTRIMAG VENTRICULAR ASSIST DEVICE (Right)  Total Length of Stay:  LOS: 14 days   Subjective: Remains sedated on vent with frequent hiccups   Objective: Vital signs in last 24 hours: Temp:  [99.5 F (37.5 C)-100.9 F (38.3 C)] 100.2 F (37.9 C) (09/29 0700) Pulse Rate:  [108-130] 115 (09/29 0700) Cardiac Rhythm: Atrial flutter (09/29 0645) Resp:  [0-30] 20 (09/29 0700) BP: (83-131)/(50-75) 108/56 (09/29 0700) SpO2:  [94 %-100 %] 100 % (09/29 0700) Arterial Line BP: (85-148)/(36-66) 125/50 (09/29 0700) FiO2 (%):  [40 %] 40 % (09/29 0600) Weight:  [208 lb 1.8 oz (94.4 kg)] 208 lb 1.8 oz (94.4 kg) (09/29 0500)  Filed Weights   09/04/17 0500 09/05/17 0600 09/06/17 0500  Weight: 211 lb 3.2 oz (95.8 kg) 209 lb 14.1 oz (95.2 kg) 208 lb 1.8 oz (94.4 kg)    Weight change: -1 lb 12.2 oz (-0.8 kg)   Hemodynamic parameters for last 24 hours: PAP: (22-32)/(15-27) 28/22 CVP:  [8 mmHg-19 mmHg] 16 mmHg CO:  [4.8 L/min-5.9 L/min] 5.8 L/min CI:  [2.3 L/min/m2-2.7 L/min/m2] 2.7 L/min/m2  Intake/Output from previous day: 09/28 0701 - 09/29 0700 In: 4520.1 [I.V.:2290.1; Blood:310; NG/GT:1590; IV Piggyback:300] Out: 5784 [Urine:3220; Emesis/NG output:1500; Chest Tube:510]  Intake/Output this shift: No intake/output data recorded.  Current Meds: Scheduled Meds: . aspirin EC  325 mg Oral Daily   Or  . aspirin  324 mg Per Tube Daily  . atorvastatin  80 mg Per Tube q1800  . brimonidine  1 drop Right Eye BID  . chlorhexidine gluconate (MEDLINE  KIT)  15 mL Mouth Rinse BID  . Chlorhexidine Gluconate Cloth  6 each Topical Q0600  . digoxin  0.125 mg Oral Daily  . feeding supplement (PRO-STAT SUGAR FREE 64)  30 mL Per Tube TID  . furosemide  20 mg Intravenous BID  . hydrocortisone sod succinate (SOLU-CORTEF) inj  50 mg Intravenous Daily  . insulin detemir  55 Units Subcutaneous BID  . mouth rinse  15 mL Mouth Rinse 10 times per day  . mupirocin ointment  1 application Topical BID  . pantoprazole (PROTONIX) IV  40 mg Intravenous Q12H  . sodium chloride flush  10-40 mL Intracatheter Q12H  . sodium chloride flush  3 mL Intravenous Q12H   Continuous Infusions: . sodium chloride    . sodium chloride 10 mL (09/01/17 0800)  . sodium chloride 10 mL/hr at 09/06/17 0700  . sodium chloride 10 mL/hr at 09/06/17 0700  . amiodarone 30 mg/hr (09/06/17 0700)  . chlorproMAZINE (THORAZINE) IV 12.5 mg (09/06/17 0748)  . EPINEPHrine 4 mg in dextrose 5% 250 mL infusion (16 mcg/mL) 3.013 mcg/min (09/06/17 0700)  . feeding supplement (VITAL 1.5 CAL) 1,000 mL (09/06/17 0700)  . fentaNYL infusion INTRAVENOUS 400 mcg/hr (09/06/17 0700)  . heparin 2,550 Units/hr (09/06/17 0700)  . insulin (NOVOLIN-R) infusion 3.1 Units/hr (09/06/17 0700)  . midazolam (VERSED) infusion 6 mg/hr (  09/06/17 0700)  . milrinone 0.2 mcg/kg/min (09/06/17 0700)  . norepinephrine (LEVOPHED) Adult infusion 8 mcg/min (09/06/17 0700)  . piperacillin-tazobactam (ZOSYN)  IV 3.375 g (09/06/17 0600)  . vancomycin Stopped (09/05/17 1926)  . vasopressin (PITRESSIN) infusion - *FOR SHOCK* Stopped (09/02/17 0730)   PRN Meds:.acetaminophen (TYLENOL) oral liquid 160 mg/5 mL, bisacodyl, chlorproMAZINE (THORAZINE) IV, fentaNYL, fentaNYL (SUBLIMAZE) injection, metoprolol tartrate, midazolam, midazolam, ondansetron (ZOFRAN) IV, sodium chloride flush, sodium chloride flush  General appearance: sedated on vent  Neurologic: sedated  Heart: reamins in flutter  Lungs: diminished breath sounds  bilaterally Abdomen: soft, non-tender; bowel sounds normal; no masses,  no organomegaly Extremities: edema mild bilaterial Wound: dressing intact   Lab Results: CBC: Recent Labs  09/05/17 2033 09/05/17 2206 09/06/17 0500  WBC 20.2*  --  17.9*  HGB 8.5* 7.8* 8.1*  HCT 26.3* 23.0* 25.2*  PLT 221  --  206   BMET:  Recent Labs  09/05/17 0346  09/05/17 1558 09/05/17 2206 09/06/17 0500  NA 137  < > 134* 135 136  K 3.6  < > 4.5 4.0 3.8  CL 93*  --  93*  --  94*  CO2 33*  --   --   --  31  GLUCOSE 90  < > 175* 108* 91  BUN 55*  --  52*  --  54*  CREATININE 1.37*  --  1.70*  --  1.62*  CALCIUM 8.6*  --   --   --  8.1*  < > = values in this interval not displayed.  CMET: Lab Results  Component Value Date   WBC 17.9 (H) 09/06/2017   HGB 8.1 (L) 09/06/2017   HCT 25.2 (L) 09/06/2017   PLT 206 09/06/2017   GLUCOSE 91 09/06/2017   CHOL 126 08/24/2017   TRIG 240 (H) 08/24/2017   HDL 31 (L) 08/24/2017   LDLDIRECT 128.6 10/30/2012   LDLCALC 47 08/24/2017   ALT 18 09/06/2017   AST 45 (H) 09/06/2017   NA 136 09/06/2017   K 3.8 09/06/2017   CL 94 (L) 09/06/2017   CREATININE 1.62 (H) 09/06/2017   BUN 54 (H) 09/06/2017   CO2 31 09/06/2017   TSH 0.722 08/25/2017   INR 1.35 09/06/2017   HGBA1C 5.9 (H) 08/25/2017   MICROALBUR 30.9 (H) 10/30/2012      PT/INR:  Recent Labs  09/06/17 0500  LABPROT 16.5*  INR 1.35   Radiology: No results found.   Assessment/Plan: S/P Procedure(s) (LRB): CORONARY ARTERY BYPASS GRAFTING (CABG) times four using left internal mammary artery and right greater saphenous leg vein using endoscope. (N/A) TRANSESOPHAGEAL ECHOCARDIOGRAM (TEE) (N/A) PLACEMENT OF CENTRIMAG VENTRICULAR ASSIST DEVICE (Right)   Renal function stable H/h continues to slowly drop hgb 8.1 this am coox 60.5 this am Currently EPI 3, milrinone 0.2, levophed 5, fentyl and versed amniodrone 30 with additional bolus last night Heparin at 2450  With ACT 191 Pump at 2600  rpm flow at 3.3   additional - unit rpbc today   Grace Isaac 09/06/2017 7:48 AM

## 2017-09-06 NOTE — Progress Notes (Signed)
Patient ID: Clinton Harrington, male   DOB: 27-Mar-1951, 66 y.o.   MRN: [id number] EVENING ROUNDS NOTE :     [address]       [place] [address]             [phone]                 9 Days Post-Op Procedure(s) (LRB): CORONARY ARTERY BYPASS GRAFTING (CABG) times four using left internal mammary artery and right greater saphenous leg vein using endoscope. (N/A) TRANSESOPHAGEAL ECHOCARDIOGRAM (TEE) (N/A) PLACEMENT OF CENTRIMAG VENTRICULAR ASSIST DEVICE (Right)  Total Length of Stay:  LOS: 14 days  BP (!) 114/47   Pulse 91   Temp 98.1 F (36.7 C)   Resp 12   Ht  (1.803 m)   Wt 208 lb 1.8 oz (94.4 kg)   SpO2 100%   BMI 29.03 kg/m   .Intake/Output      09/29 0701 - 09/30 0700   I.V. (mL/kg) 1195.2 (12.7)   Blood 599   NG/GT 617.5   IV Piggyback 100   Total Intake(mL/kg) 2511.7 (26.6)   Urine (mL/kg/hr) 870 (0.7)   Emesis/NG output 1000   Chest Tube 970   Total Output 2840   Net -328.3         . sodium chloride    . sodium chloride 10 mL (09/01/17 0800)  . sodium chloride 10 mL/hr at 09/06/17 1900  . sodium chloride 10 mL/hr at 09/06/17 1900  . sodium chloride    . sodium chloride    . amiodarone 30 mg/hr (09/06/17 1900)  . chlorproMAZINE (THORAZINE) IV 12.5 mg (09/06/17 0748)  . EPINEPHrine 4 mg in dextrose 5% 250 mL infusion (16 mcg/mL) 3.013 mcg/min (09/06/17 1900)  . feeding supplement (VITAL 1.5 CAL) 1,000 mL (09/06/17 1900)  . fentaNYL infusion INTRAVENOUS 400 mcg/hr (09/06/17 1900)  . heparin 1,800 Units/hr (09/06/17 1900)  . insulin (NOVOLIN-R) infusion 11.9 mL/hr at 09/06/17 1900  . midazolam (VERSED) infusion 6 mg/hr (09/06/17 1900)  . milrinone 0.2 mcg/kg/min (09/06/17 1900)  . norepinephrine (LEVOPHED) Adult infusion 12.053 mcg/min (09/06/17 1900)  . piperacillin-tazobactam (ZOSYN)  IV Stopped (09/06/17 1817)  . vancomycin Stopped (09/06/17 2039)  . vasopressin (PITRESSIN) infusion - *FOR SHOCK* Stopped (09/02/17 0730)      Lab Results  Component Value Date   WBC 16.9 (H) 09/06/2017   HGB 7.7 (L) 09/06/2017   HCT 23.8 (L) 09/06/2017   PLT 191 09/06/2017   GLUCOSE 127 (H) 09/06/2017   CHOL 126 08/24/2017   TRIG 240 (H) 08/24/2017   HDL 31 (L) 08/24/2017   LDLDIRECT 128.6 10/30/2012   LDLCALC 47 08/24/2017   ALT 18 09/06/2017   AST 45 (H) 09/06/2017   NA 136 09/06/2017   K 4.3 09/06/2017   CL 94 (L) 09/06/2017   CREATININE 1.62 (H) 09/06/2017   BUN 54 (H) 09/06/2017   CO2 31 09/06/2017   TSH 0.722 08/25/2017   INR 1.35 09/06/2017   HGBA1C 5.9 (H) 08/25/2017   MICROALBUR 30.9 (H) 10/30/2012   Heparin reduced with increased bleeding and ct output at higher levels , decreased range for act  replacing blood  I have seen and examined [name] and agree with the above assessment  and plan.  [name] Beeper [phone] Office [phone] 09/06/2017 7:37 PM    [name]  Beeper [phone] Office [phone] 09/06/2017 7:36 PM

## 2017-09-06 NOTE — Progress Notes (Signed)
Pharmacy Antibiotic Note  Clinton Harrington is a 66 y.o. male admitted on 08/23/2017 with infection prophylaxis post-centrimag placement.  Pharmacy has been consulted for vancomycin and ceftazidime dosing.    WBC remains elevated at 16.9. Vancomycin trough is therapeutic tonight at 28mcg/mL. SCr stable at 1.62.  Plan: Continue vancomycin at  IV q24h Ceftazidime 1gm IV q8h Monitor for s/sx of infection, LOT, and check troughs as appropriate.    Height:  (180.3 cm) Weight: 208 lb 1.8 oz (94.4 kg) IBW/kg (Calculated) : 75.3  Temp (24hrs), Avg:99.6 F (37.6 C), Min:97.9 F (36.6 C), Max:100.9 F (38.3 C)   Recent Labs Lab 09/03/17 0800  09/04/17 0324  09/04/17 1627  09/05/17 0346  09/05/17 1415 09/05/17 1558 09/05/17 2033 09/06/17 0500 09/06/17 1140 09/06/17 1700 09/06/17 1720  WBC 12.9*  < > 19.2*  < >  --   < > 18.9*  < > 21.1*  --  20.2* 17.9* 17.6* 16.9*  --   CREATININE  --   < > 1.46*  --  1.40*  --  1.37*  --   --  1.70*  --  1.62*  --   --   --   VANCOTROUGH 24*  --   --   --   --   --   --   --   --   --   --   --   --   --  18  < > = values in this interval not displayed.  Estimated Creatinine Clearance: 54.7 mL/min (A) (by C-G formula based on SCr of 1.62 mg/dL (H)).    Allergies  Allergen Reactions  . Aspirin Other (See Comments)    Stomach upset  . Latex Rash  . Oxycodone-Acetaminophen Itching   Antimicrobials this admission:  9/20 Vanc >> 9/20 Cefuroxime >> 9/22 9/22 Ceftaz >>  Microbiology results:  9/24 Bld x2: ngtd 9/24 UCx: ng 9/24 Sputum: ng 9/20 and 9/22 Sputum: neg  Dose adjustments: 9/23 Vancomycin trough 16 - continue 1g q12 hours 9/26 Vancomycin trough 24 - reduce to  q24 hours 9/29 vancomycin trough 18- continue  q24h  Nashira Mcglynn D. Breanda Greenlaw, PharmD, BCPS Clinical Pharmacist 09/06/2017 6:26 PM

## 2017-09-06 NOTE — CV Procedure (Signed)
    DIRECT CURRENT CARDIOVERSION  NAME:  Clinton Harrington   MRN: [id number] DOB:  Mar 15, 1951   ADMIT DATE: 08/23/2017   INDICATIONS: Atrial flutter with worsening hypotension   PROCEDURE:   Emergent consent assumed. Once an appropriate time out was taken, the patient had the defibrillator pads placed in the anterior and posterior position. The patient was already intubated and received extra sedation. Once an appropriate level of sedation was achieved, the patient received a single biphasic, synchronized 150J shock with prompt conversion to sinus rhythm. No apparent complications.  Arvilla Meres, MD  11:23 AM

## 2017-09-06 NOTE — Progress Notes (Signed)
PHARMACY - PHYSICIAN COMMUNICATION CRITICAL VALUE ALERT - BLOOD CULTURE IDENTIFICATION (BCID)  Results for orders placed or performed during the hospital encounter of 08/23/17  Blood Culture ID Panel (Reflexed) (Collected: 09/05/2017  3:05 PM)  Result Value Ref Range   Enterococcus species NOT DETECTED NOT DETECTED   Listeria monocytogenes NOT DETECTED NOT DETECTED   Staphylococcus species DETECTED (A) NOT DETECTED   Staphylococcus aureus NOT DETECTED NOT DETECTED   Methicillin resistance DETECTED (A) NOT DETECTED   Streptococcus species NOT DETECTED NOT DETECTED   Streptococcus agalactiae NOT DETECTED NOT DETECTED   Streptococcus pneumoniae NOT DETECTED NOT DETECTED   Streptococcus pyogenes NOT DETECTED NOT DETECTED   Acinetobacter baumannii NOT DETECTED NOT DETECTED   Enterobacteriaceae species NOT DETECTED NOT DETECTED   Enterobacter cloacae complex NOT DETECTED NOT DETECTED   Escherichia coli NOT DETECTED NOT DETECTED   Klebsiella oxytoca NOT DETECTED NOT DETECTED   Klebsiella pneumoniae NOT DETECTED NOT DETECTED   Proteus species NOT DETECTED NOT DETECTED   Serratia marcescens NOT DETECTED NOT DETECTED   Haemophilus influenzae NOT DETECTED NOT DETECTED   Neisseria meningitidis NOT DETECTED NOT DETECTED   Pseudomonas aeruginosa NOT DETECTED NOT DETECTED   Candida albicans NOT DETECTED NOT DETECTED   Candida glabrata NOT DETECTED NOT DETECTED   Candida krusei NOT DETECTED NOT DETECTED   Candida parapsilosis NOT DETECTED NOT DETECTED   Candida tropicalis NOT DETECTED NOT DETECTED    Name of physician (or Provider) Contacted: Text paged Dr. Gala Romney and Dr. Donnie Aho  Changes to prescribed antibiotics required: likely contamination, already on vancomycin. No action needed.  Riki Rusk 09/06/2017  4:39 PM

## 2017-09-07 ENCOUNTER — Inpatient Hospital Stay (HOSPITAL_COMMUNITY): Payer: PPO

## 2017-09-07 LAB — POCT I-STAT 3, ART BLOOD GAS (G3+)
ACID-BASE EXCESS: 6 mmol/L — AB (ref 0.0–2.0)
Bicarbonate: 29.5 mmol/L — ABNORMAL HIGH (ref 20.0–28.0)
O2 SAT: 98 %
PH ART: 7.501 — AB (ref 7.350–7.450)
Patient temperature: 37.4
TCO2: 31 mmol/L (ref 22–32)
pCO2 arterial: 37.9 mmHg (ref 32.0–48.0)
pO2, Arterial: 94 mmHg (ref 83.0–108.0)

## 2017-09-07 LAB — POCT ACTIVATED CLOTTING TIME
ACTIVATED CLOTTING TIME: 153 s
ACTIVATED CLOTTING TIME: 158 s
ACTIVATED CLOTTING TIME: 158 s
ACTIVATED CLOTTING TIME: 158 s
ACTIVATED CLOTTING TIME: 158 s
ACTIVATED CLOTTING TIME: 147 s
ACTIVATED CLOTTING TIME: 147 s
ACTIVATED CLOTTING TIME: 147 s
ACTIVATED CLOTTING TIME: 153 s
Activated Clotting Time: 147 seconds
Activated Clotting Time: 153 seconds
Activated Clotting Time: 153 seconds
Activated Clotting Time: 153 seconds
Activated Clotting Time: 153 seconds
Activated Clotting Time: 153 seconds
Activated Clotting Time: 153 seconds
Activated Clotting Time: 153 seconds
Activated Clotting Time: 158 seconds
Activated Clotting Time: 158 seconds
Activated Clotting Time: 158 seconds
Activated Clotting Time: 158 seconds
Activated Clotting Time: 164 seconds

## 2017-09-07 LAB — COOXEMETRY PANEL
Carboxyhemoglobin: 1.6 % — ABNORMAL HIGH (ref 0.5–1.5)
Methemoglobin: 1 % (ref 0.0–1.5)
O2 Saturation: 89.5 %
Total hemoglobin: 5.7 g/dL — CL (ref 12.0–16.0)

## 2017-09-07 LAB — COMPREHENSIVE METABOLIC PANEL
ALT: 15 U/L — ABNORMAL LOW (ref 17–63)
AST: 41 U/L (ref 15–41)
Albumin: 1.9 g/dL — ABNORMAL LOW (ref 3.5–5.0)
Alkaline Phosphatase: 83 U/L (ref 38–126)
Anion gap: 9 (ref 5–15)
BUN: 63 mg/dL — ABNORMAL HIGH (ref 6–20)
CO2: 27 mmol/L (ref 22–32)
Calcium: 7.9 mg/dL — ABNORMAL LOW (ref 8.9–10.3)
Chloride: 99 mmol/L — ABNORMAL LOW (ref 101–111)
Creatinine, Ser: 1.72 mg/dL — ABNORMAL HIGH (ref 0.61–1.24)
GFR calc Af Amer: 47 mL/min — ABNORMAL LOW (ref >60)
GFR calc non Af Amer: 41 mL/min — ABNORMAL LOW (ref >60)
Glucose, Bld: 97 mg/dL (ref 65–99)
Potassium: 3.6 mmol/L (ref 3.5–5.1)
Sodium: 135 mmol/L (ref 135–145)
Total Bilirubin: 0.8 mg/dL (ref 0.3–1.2)
Total Protein: 4.9 g/dL — ABNORMAL LOW (ref 6.5–8.1)

## 2017-09-07 LAB — GLUCOSE, CAPILLARY
GLUCOSE-CAPILLARY: 100 mg/dL — AB (ref 65–99)
GLUCOSE-CAPILLARY: 92 mg/dL (ref 65–99)
GLUCOSE-CAPILLARY: 104 mg/dL — AB (ref 65–99)
GLUCOSE-CAPILLARY: 124 mg/dL — AB (ref 65–99)
GLUCOSE-CAPILLARY: 159 mg/dL — AB (ref 65–99)
GLUCOSE-CAPILLARY: 154 mg/dL — AB (ref 65–99)
GLUCOSE-CAPILLARY: 152 mg/dL — AB (ref 65–99)
GLUCOSE-CAPILLARY: 89 mg/dL (ref 65–99)
Glucose-Capillary: 88 mg/dL (ref 65–99)
Glucose-Capillary: 89 mg/dL (ref 65–99)
Glucose-Capillary: 117 mg/dL — ABNORMAL HIGH (ref 65–99)
Glucose-Capillary: 131 mg/dL — ABNORMAL HIGH (ref 65–99)
Glucose-Capillary: 157 mg/dL — ABNORMAL HIGH (ref 65–99)
Glucose-Capillary: 153 mg/dL — ABNORMAL HIGH (ref 65–99)
Glucose-Capillary: 145 mg/dL — ABNORMAL HIGH (ref 65–99)
Glucose-Capillary: 148 mg/dL — ABNORMAL HIGH (ref 65–99)
Glucose-Capillary: 149 mg/dL — ABNORMAL HIGH (ref 65–99)
Glucose-Capillary: 124 mg/dL — ABNORMAL HIGH (ref 65–99)
Glucose-Capillary: 111 mg/dL — ABNORMAL HIGH (ref 65–99)
Glucose-Capillary: 106 mg/dL — ABNORMAL HIGH (ref 65–99)
Glucose-Capillary: 103 mg/dL — ABNORMAL HIGH (ref 65–99)

## 2017-09-07 LAB — POCT I-STAT, CHEM 8
BUN: 68 mg/dL — AB (ref 6–20)
CREATININE: 1.8 mg/dL — AB (ref 0.61–1.24)
Calcium, Ion: 1.04 mmol/L — ABNORMAL LOW (ref 1.15–1.40)
Chloride: 98 mmol/L — ABNORMAL LOW (ref 101–111)
GLUCOSE: 156 mg/dL — AB (ref 65–99)
HCT: 24 % — ABNORMAL LOW (ref 39.0–52.0)
HEMOGLOBIN: 8.2 g/dL — AB (ref 13.0–17.0)
POTASSIUM: 5 mmol/L (ref 3.5–5.1)
Sodium: 134 mmol/L — ABNORMAL LOW (ref 135–145)
TCO2: 25 mmol/L (ref 22–32)

## 2017-09-07 LAB — POCT I-STAT 4, (NA,K, GLUC, HGB,HCT)
Glucose, Bld: 102 mg/dL — ABNORMAL HIGH (ref 65–99)
HEMATOCRIT: 23 % — AB (ref 39.0–52.0)
Hemoglobin: 7.8 g/dL — ABNORMAL LOW (ref 13.0–17.0)
Potassium: 3.8 mmol/L (ref 3.5–5.1)
Sodium: 136 mmol/L (ref 135–145)

## 2017-09-07 LAB — CBC
HCT: 22.2 % — ABNORMAL LOW (ref 39.0–52.0)
HCT: 23 % — ABNORMAL LOW (ref 39.0–52.0)
HCT: 25.7 % — ABNORMAL LOW (ref 39.0–52.0)
Hemoglobin: 7.4 g/dL — ABNORMAL LOW (ref 13.0–17.0)
Hemoglobin: 7.8 g/dL — ABNORMAL LOW (ref 13.0–17.0)
Hemoglobin: 8.8 g/dL — ABNORMAL LOW (ref 13.0–17.0)
MCH: 28.9 pg (ref 26.0–34.0)
MCH: 29.3 pg (ref 26.0–34.0)
MCH: 29.8 pg (ref 26.0–34.0)
MCHC: 33.3 g/dL (ref 30.0–36.0)
MCHC: 33.9 g/dL (ref 30.0–36.0)
MCHC: 34.2 g/dL (ref 30.0–36.0)
MCV: 86.7 fL (ref 78.0–100.0)
MCV: 86.5 fL (ref 78.0–100.0)
MCV: 87.1 fL (ref 78.0–100.0)
Platelets: 186 10*3/uL (ref 150–400)
Platelets: 179 10*3/uL (ref 150–400)
Platelets: 203 10*3/uL (ref 150–400)
RBC: 2.56 MIL/uL — ABNORMAL LOW (ref 4.22–5.81)
RBC: 2.66 MIL/uL — ABNORMAL LOW (ref 4.22–5.81)
RBC: 2.95 MIL/uL — ABNORMAL LOW (ref 4.22–5.81)
RDW: 15.9 % — ABNORMAL HIGH (ref 11.5–15.5)
RDW: 15.5 % (ref 11.5–15.5)
RDW: 15 % (ref 11.5–15.5)
WBC: 17.1 10*3/uL — ABNORMAL HIGH (ref 4.0–10.5)
WBC: 18.8 10*3/uL — ABNORMAL HIGH (ref 4.0–10.5)
WBC: 17.7 10*3/uL — ABNORMAL HIGH (ref 4.0–10.5)

## 2017-09-07 LAB — PROTIME-INR
INR: 1.56
Prothrombin Time: 18.6 seconds — ABNORMAL HIGH (ref 11.4–15.2)

## 2017-09-07 LAB — PREPARE RBC (CROSSMATCH)

## 2017-09-07 LAB — APTT: aPTT: 74 seconds — ABNORMAL HIGH (ref 24–36)

## 2017-09-07 LAB — LACTATE DEHYDROGENASE: LDH: 287 U/L — ABNORMAL HIGH (ref 98–192)

## 2017-09-07 LAB — HEPARIN LEVEL (UNFRACTIONATED): Heparin Unfractionated: 0.14 IU/mL — ABNORMAL LOW (ref 0.30–0.70)

## 2017-09-07 LAB — CALCIUM, IONIZED: Calcium, Ionized, Serum: 4.6 mg/dL (ref 4.5–5.6)

## 2017-09-07 MED ORDER — SODIUM CHLORIDE 0.9 % IV SOLN
1.0000 g | Freq: Once | INTRAVENOUS | Status: AC
  Administered 2017-09-07: 1 g via INTRAVENOUS
  Filled 2017-09-07: qty 10

## 2017-09-07 MED ORDER — SODIUM CHLORIDE 0.9 % IV SOLN: Freq: Once | INTRAVENOUS | Status: DC

## 2017-09-07 MED ORDER — POTASSIUM CHLORIDE 10 MEQ/50ML IV SOLN
10.0000 meq | INTRAVENOUS | Status: DC | PRN
  Administered 2017-09-07 (×2): 10 meq via INTRAVENOUS
  Filled 2017-09-07 (×3): qty 50

## 2017-09-07 MED ORDER — CHLORHEXIDINE GLUCONATE 0.12 % MT SOLN
OROMUCOSAL | Status: AC
  Administered 2017-09-07: 15 mL
  Filled 2017-09-07: qty 15

## 2017-09-07 MED ORDER — FUROSEMIDE 10 MG/ML IJ SOLN
40.0000 mg | Freq: Two times a day (BID) | INTRAMUSCULAR | Status: DC
  Administered 2017-09-07 (×2): 40 mg via INTRAVENOUS
  Filled 2017-09-07: qty 4

## 2017-09-07 MED ORDER — SODIUM CHLORIDE 0.9 % IV SOLN
Freq: Once | INTRAVENOUS | Status: AC
  Administered 2017-09-07: 10 mL/h via INTRAVENOUS

## 2017-09-07 MED ORDER — SODIUM CHLORIDE 0.9 % IV SOLN
Freq: Once | INTRAVENOUS | Status: AC
  Administered 2017-09-07: 18:00:00 via INTRAVENOUS

## 2017-09-07 NOTE — Progress Notes (Addendum)
[facility] DAILY ICU PROGRESS NOTE                   [address]            [place] [address]          [phone]   10 Days Post-Op Procedure(s) (LRB): CORONARY ARTERY BYPASS GRAFTING (CABG) times four using left internal mammary artery and right greater saphenous leg vein using endoscope. (N/A) TRANSESOPHAGEAL ECHOCARDIOGRAM (TEE) (N/A) PLACEMENT OF CENTRIMAG VENTRICULAR ASSIST DEVICE (Right)  Total Length of Stay:  LOS: 15 days   Subjective:  Remains sedated on vent.  Continues to have hiccups  Objective: Vital signs in last 24 hours: Temp:  [97.3 F (36.3 C)-100.8 F (38.2 C)] 97.3 F (36.3 C) (09/30 0830) Pulse Rate:  [45-123] 93 (09/30 0830) Cardiac Rhythm: Normal sinus rhythm (09/30 0745) Resp:  [7-29] 18 (09/30 0830) BP: (85-120)/(42-60) 108/52 (09/30 0800) SpO2:  [96 %-100 %] 98 % (09/30 0830) Arterial Line BP: (90-153)/(26-58) 104/47 (09/30 0830) FiO2 (%):  [40 %] 40 % (09/30 0723) Weight:  [210 lb 1.6 oz (95.3 kg)] 210 lb 1.6 oz (95.3 kg) (09/30 0500)  Filed Weights   09/05/17 0600 09/06/17 0500 09/07/17 0500  Weight: 209 lb 14.1 oz (95.2 kg) 208 lb 1.8 oz (94.4 kg) 210 lb 1.6 oz (95.3 kg)    Weight change: 1 lb 15.7 oz (0.9 kg)   Hemodynamic parameters for last 24 hours: PAP: (19-35)/(7-27) 26/19 CVP:  [8 mmHg-25 mmHg] 9 mmHg CO:  [4.6 L/min-5.4 L/min] 5.4 L/min CI:  [2.1 L/min/m2-2.5 L/min/m2] 2.5 L/min/m2  Intake/Output from previous day: 09/29 0701 - 09/30 0700 In: 5546.6 [I.V.:2515.1; Blood:1349; DD/UK:0254.2; IV Piggyback:225] Out: 7062 [BJSEG:3151; Emesis/NG output:2050; Chest Tube:2450]  Intake/Output this shift: Total I/O In: 667.7 [I.V.:302.7; Blood:315; NG/GT:50] Out: 220 [Urine:30; Chest Tube:190]  Current Meds: Scheduled Meds: . aspirin EC  325 mg Oral Daily   Or  . aspirin  324 mg Per Tube Daily  . atorvastatin  80 mg Per Tube q1800  . brimonidine  1 drop Right Eye BID  . chlorhexidine gluconate (MEDLINE KIT)  15 mL  Mouth Rinse BID  . Chlorhexidine Gluconate Cloth  6 each Topical Q0600  . digoxin  0.125 mg Oral Daily  . feeding supplement (PRO-STAT SUGAR FREE 64)  30 mL Per Tube TID  . furosemide  40 mg Intravenous BID  . hydrocortisone sod succinate (SOLU-CORTEF) inj  50 mg Intravenous Daily  . insulin detemir  55 Units Subcutaneous BID  . mouth rinse  15 mL Mouth Rinse 10 times per day  . mupirocin ointment  1 application Topical BID  . pantoprazole (PROTONIX) IV  40 mg Intravenous Q12H  . sodium chloride flush  10-40 mL Intracatheter Q12H  . sodium chloride flush  3 mL Intravenous Q12H   Continuous Infusions: . sodium chloride    . sodium chloride 10 mL (09/01/17 0800)  . sodium chloride 10 mL/hr at 09/07/17 0800  . sodium chloride 10 mL/hr at 09/07/17 0800  . sodium chloride    . sodium chloride    . amiodarone 30 mg/hr (09/07/17 0800)  . chlorproMAZINE (THORAZINE) IV 12.5 mg (09/07/17 0600)  . EPINEPHrine 4 mg in dextrose 5% 250 mL infusion (16 mcg/mL) 3 mcg/min (09/07/17 0800)  . feeding supplement (VITAL 1.5 CAL) 1,000 mL (09/07/17 0800)  . fentaNYL infusion INTRAVENOUS 400 mcg/hr (09/07/17 0800)  . heparin 1,100 Units/hr (09/07/17 0800)  . insulin (NOVOLIN-R) infusion 4.6 mL/hr at 09/07/17  0800  . midazolam (VERSED) infusion 6 mg/hr (09/07/17 0800)  . milrinone 0.2 mcg/kg/min (09/07/17 0800)  . norepinephrine (LEVOPHED) Adult infusion 21 mcg/min (09/07/17 0800)  . piperacillin-tazobactam (ZOSYN)  IV 3.375 g (09/07/17 0550)  . potassium chloride Stopped (09/07/17 0805)  . vancomycin Stopped (09/06/17 2039)  . vasopressin (PITRESSIN) infusion - *FOR SHOCK* Stopped (09/02/17 0730)   PRN Meds:.acetaminophen (TYLENOL) oral liquid 160 mg/5 mL, bisacodyl, chlorproMAZINE (THORAZINE) IV, fentaNYL, fentaNYL (SUBLIMAZE) injection, metoprolol tartrate, midazolam, midazolam, ondansetron (ZOFRAN) IV, potassium chloride, sodium chloride flush, sodium chloride flush  General appearance: intubated  and sedated Heart: regular rate and rhythm and tachy, centrimag in place Lungs: clear to auscultation bilaterally Abdomen: soft non tender, hypoactive BS Extremities: extremities normal, atraumatic, no cyanosis or edema and edema 1+  Lab Results: CBC: Recent Labs  09/06/17 2338 09/07/17 0002 09/07/17 0206  WBC 17.3*  --  17.1*  HGB 9.0* 7.8* 7.4*  HCT 26.5* 23.0* 22.2*  PLT 175  --  186   BMET:  Recent Labs  09/06/17 0500  09/06/17 1659 09/07/17 0002 09/07/17 0206  NA 136  < > 133* 136 135  K 3.8  < > 4.3 3.8 3.6  CL 94*  --  93*  --  99*  CO2 31  --   --   --  27  GLUCOSE 91  < > 175* 102* 97  BUN 54*  --  60*  --  63*  CREATININE 1.62*  --  1.70*  --  1.72*  CALCIUM 8.1*  --   --   --  7.9*  < > = values in this interval not displayed.  CMET: Lab Results  Component Value Date   WBC 17.1 (H) 09/07/2017   HGB 7.4 (L) 09/07/2017   HCT 22.2 (L) 09/07/2017   PLT 186 09/07/2017   GLUCOSE 97 09/07/2017   CHOL 126 08/24/2017   TRIG 240 (H) 08/24/2017   HDL 31 (L) 08/24/2017   LDLDIRECT 128.6 10/30/2012   LDLCALC 47 08/24/2017   ALT 15 (L) 09/07/2017   AST 41 09/07/2017   NA 135 09/07/2017   K 3.6 09/07/2017   CL 99 (L) 09/07/2017   CREATININE 1.72 (H) 09/07/2017   BUN 63 (H) 09/07/2017   CO2 27 09/07/2017   TSH 0.722 08/25/2017   INR 1.56 09/07/2017   HGBA1C 5.9 (H) 08/25/2017   MICROALBUR 30.9 (H) 10/30/2012      PT/INR:  Recent Labs  09/07/17 0206  LABPROT 18.6*  INR 1.56   Radiology: Dg Chest Port 1 View  Result Date: 09/07/2017 CLINICAL DATA:  Status post CABG. EXAM: PORTABLE CHEST 1 VIEW COMPARISON:  09/06/2017 and prior radiographs FINDINGS: A new left apical/medial pneumothorax is identified (25-30%). Cardiomegaly and CABG changes again noted. Endotracheal tube with tip 2.5 cm above the carina, Swan-Ganz catheter with tip overlying the main pulmonary artery, right PICC line with tip overlying the superior cavoatrial junction, NG tube entering  the stomach, mediastinal tube and bilateral thoracostomy tubes are present. Defibrillator pads overlying the left chest are now noted. Increasing pulmonary vascular congestion/ possible mild interstitial edema noted. IMPRESSION: New left apical/medial pneumothorax - 25-30%. Increasing pulmonary vascular congestion/possible mild interstitial edema. No other significant change. Critical Value/emergent results were called by telephone at the time of interpretation on 09/07/2017 at 8:48 am to [facility], RN, who verbally acknowledged these results. Electronically Signed   By: Margarette Canada M.D.   On: 09/07/2017 08:56     Assessment/Plan: S/P Procedure(s) (LRB): CORONARY ARTERY  BYPASS GRAFTING (CABG) times four using left internal mammary artery and right greater saphenous leg vein using endoscope. (N/A) TRANSESOPHAGEAL ECHOCARDIOGRAM (TEE) (N/A) PLACEMENT OF CENTRIMAG VENTRICULAR ASSIST DEVICE (Right)  1. CV- Tachy, remains on Amiodarone, Epi '@3'$ , Milrinone @ 0.2, Levo @ 21-- centrimag remains in place at 2600 rpm, 3.5 L/Min... Remains on Heparin at 1100, ACT is 153 2. Pulm- remains intubated and sedated, chest tube output 2370 yesterday, CXR with increased pneumothorax on left, no air leak present, leave in place 3. Renal- creatinine relatively stable at 1.72, lytes okay 4. Post operative blood loss- patient with high chest tube output, on Heparin for Centrimag, Hgb at 7.4 despite transfusion.. Will continue blood products, ideally need Hgb level of 8.5 5. Dispo- patient remains critically ill, continue current care, appreciate CCM, and AHF assistance     BARRETT, ERIN 09/07/2017 8:59 AM   Has continued to require bllod to keep hgb up with bleeding from chest tubes with heparin Hold sinus Pump removal in am I have seen and examined Archie Endo and agree with the above assessment  and plan.  Grace Isaac MD Beeper [phone] Office [phone] 09/07/2017 5:27 PM

## 2017-09-07 NOTE — Progress Notes (Signed)
Received call from Dr. Maren Beach for an update. He would like Heparin decreased to 1000U/hr and leave it. Check ACT every 2 hours. Make patient NPO at Midnight for surgery in the morning.

## 2017-09-07 NOTE — Progress Notes (Addendum)
PULMONARY / CRITICAL CARE MEDICINE   Name: REFAEL FULOP MRN: [id number] DOB: 11-24-1951    ADMISSION DATE:  08/23/2017 CONSULTATION DATE:  08/30/17  REFERRING MD: Dr Roxy Manns  CHIEF COMPLAINT:  Acute resp failure, hypoxia, hypercapnia  brief:  Patient is a 66 y/o Caucasian M with hx CADs/p BMS to RCA in 1999 (due to an inferior STEMI), significant PVD, HTN, Type 2 DM (non-insulin dependent), HLP, Tobacco use comes in today complaining of substernal chest pain on admission.He continued to have active CP and the patient was taken to cath lab for further intervention. FOund with RV infacrct and required emergent bypass surgery. Remained in cardiogenic shock requiring balloon pump and placement of RVAD device. Remained on vent with acute resp failure, skin closed but sternum is not closed.He has developed worsening acidosis and hypoxia with bilateral int infiltrates. He has been pos 4 liters last 2 4 hours. He remains on levophed and milrinone. Asked to assist in critical care.  CULTURES: 9/22 sputum>>>ng 9/24>> Sputum>>ng 9/24 Blood>>NEG  9/24 Urine>>ng 9/28 BC x 2 >GPC , ID panel +MRSA >  ANTIBIOTICS: zinacef >>>9/22- 9/24 ceftaz 9/22>>>9/28 Vanc>>>9/24>> Zosyn 9/28 >>  SIGNIFICANT EVENTS: 08/23/2017 - admit 9/22 - intubated Cath cabg RV assist device 9/23 - On epi/levo/RVAD/vent.  CI 1.8 despite all interventions 9/24>> Epi, Levo, milrinine,amio,insulin gtt, Lasix gtt, Heparin, Fent, Versed 9/26 - Remains sedated on fent/ versed drips RVAD flows down to 3200 Good UO on lasix gtt @ 15 9/27 - 40% fio2, peep 8. RPM on RVAD turned down. Stil on levophed, milrinone, epi ggtt and heparin.admio gtt. Making urine. Has chest draines. Good urine outoput. Heparin on hold briefly before resumptions due to bleed in groin site. Getting 2 UI PRBC  SUBJECTIVE/OVERNIGHT/INTERVAL HX Pt with dk blood in OGT , received 4 u PRBC 9/29 .  Remains on multiple pressors with EPI and Levophed ,  increased pressor demands.  Remains on Amio/Milrinone /insulin /fent /ver/hep drips.  Remains on vent , plan to go back to surgery on 10/1 , sternal closure if stable.  Remains in neg I/O bal -9L . CVP 9  BC return with +MRSA on ID panel, final pending .   VITAL SIGNS: BP (!) 108/52   Pulse 92   Temp (!) 97.3 F (36.3 C)   Resp (!) 25   Ht _0  (1.803 m)   Wt 210 lb 1.6 oz (95.3 kg)   SpO2 98%   BMI 29.30 kg/m   HEMODYNAMICS: PAP: (19-35)/(7-27) 24/19 CVP:  [8 mmHg-25 mmHg] 9 mmHg CO:  [4.6 L/min-5.4 L/min] 5.4 L/min CI:  [2.1 L/min/m2-2.5 L/min/m2] 2.5 L/min/m2  VENTILATOR SETTINGS: Vent Mode: PRVC FiO2 (%):  [40 %] 40 % Set Rate:  [24 bmp] 24 bmp Vt Set:  [600 mL] 600 mL PEEP:  [5 cmH20-6 cmH20] 6 cmH20 Plateau Pressure:  [21 JKD32-67 cmH20] 21 cmH20  INTAKE / OUTPUT: I/O last 3 completed shifts: In: 7681 [I.V.:3709.5; Blood:1349; Other:30; NG/GT:2267.5; IV Piggyback:325] Out: 1245 [Urine:3105; Emesis/NG output:2850; Chest Tube:2740]  PHYSICAL EXAMINATION: General:  Critically ill male on vent  HEENT: ETT , right cortrak Neuro: sedated on vent , RAAS -2  CV: RRR, RVAD , sternal dressing  PULM: Event /non labored on MV , bilateral CT to sxn , no airleak  GI: soft, obese, BS +  Extremities: warm/dry , no edema  Skin: Mid sternal dressing    PULMONARY  Recent Labs Lab 09/03/17 0320  09/04/17 0340 09/04/17 1627 09/05/17 0309 09/05/17 0312 09/05/17 1558 09/06/17  0408 09/06/17 0540 09/06/17 1659 09/07/17 0403 09/07/17 0603  PHART 7.371  --  7.441  --  7.494*  --   --  7.465*  --   --  7.501*  --   PCO2ART 54.9*  --  51.3*  --  45.4  --   --  48.3*  --   --  37.9  --   PO2ART 72.1*  --  108  --  84.2  --   --  75.0*  --   --  94.0  --   HCO3 31.0*  --  34.3*  --  34.6*  --   --  34.5*  --   --  29.5*  --   TCO2  --   < >  --  34*  --   --  30 36*  --  29 31  --   O2SAT 93.8  < > 97.9  --  97.1 54.4  --  95.0 60.5  --  98.0 89.5  < > = values in this  interval not displayed.  CBC  Recent Labs Lab 09/06/17 1700 09/06/17 2338 09/07/17 0002 09/07/17 0206  HGB 7.7* 9.0* 7.8* 7.4*  HCT 23.8* 26.5* 23.0* 22.2*  WBC 16.9* 17.3*  --  17.1*  PLT 191 175  --  186    COAGULATION  Recent Labs Lab 09/03/17 0255 09/04/17 0324 09/05/17 0346 09/06/17 0500 09/07/17 0206  INR 1.11 1.15 1.16 1.35 1.56    CARDIAC  No results for input(s): TROPONINI in the last 168 hours. No results for input(s): PROBNP in the last 168 hours.   CHEMISTRY  Recent Labs Lab 09/01/17 0220  09/03/17 0255  09/03/17 0800  09/04/17 0324  09/05/17 0346  09/05/17 1558  09/06/17 0500 09/06/17 1013 09/06/17 1659 09/07/17 0002 09/07/17 0206  NA 121*  < > 126*  < >  --   < > 133*  < > 137  < > 134*  < > 136 136 133* 136 135  K 3.7  < > 4.0  < >  --   < > 4.2  < > 3.6  < > 4.5  < > 3.8 4.3 4.3 3.8 3.6  CL 84*  < > 87*  --   --   < > 92*  < > 93*  --  93*  --  94*  --  93*  --  99*  CO2 27  < > 29  --   --   --  32  --  33*  --   --   --  31  --   --   --  27  GLUCOSE 391*  < > 433*  < >  --   < > 166*  < > 90  < > 175*  < > 91 127* 175* 102* 97  BUN 19  < > 35*  --   --   < > 47*  < > 55*  --  52*  --  54*  --  60*  --  63*  CREATININE 1.19  < > 1.39*  --   --   < > 1.46*  < > 1.37*  --  1.70*  --  1.62*  --  1.70*  --  1.72*  CALCIUM 7.6*  < > 7.9*  --   --   --  8.2*  --  8.6*  --   --   --  8.1*  --   --   --  7.9*  MG 1.5*  --   --   --  1.7  --   --   --  1.7  --   --   --   --   --   --   --   --   PHOS 1.7*  --   --   --   --   --   --   --   --   --   --   --   --   --   --   --   --   < > = values in this interval not displayed. Estimated Creatinine Clearance: 51.8 mL/min (A) (by C-G formula based on SCr of 1.72 mg/dL (H)).   LIVER  Recent Labs Lab 09/02/17 0354 09/03/17 0255 09/04/17 0324 09/05/17 0346 09/06/17 0500 09/07/17 0206  AST 42* 45* 41  --  45* 41  ALT 16* 19 20  --  18 15*  ALKPHOS 81 97 82  --  59 83  BILITOT 1.3* 0.9  0.7  --  0.6 0.8  PROT 5.3* 5.6* 5.3*  --  5.8* 4.9*  ALBUMIN 2.1* 2.1* 1.9*  --  2.2* 1.9*  INR 1.19 1.11 1.15 1.16 1.35 1.56     INFECTIOUS No results for input(s): LATICACIDVEN, PROCALCITON in the last 168 hours.   ENDOCRINE CBG (last 3)   Recent Labs  09/07/17 0501 09/07/17 0604 09/07/17 0706  GLUCAP 89 117* 104*         IMAGING x48h  - image(s) personally visualized  -   highlighted in bold Dg Chest Port 1 View  Result Date: 09/06/2017 CLINICAL DATA:  Status post CABG. EXAM: PORTABLE CHEST 1 VIEW COMPARISON:  09/05/2017 FINDINGS: Sequelae of CABG are again identified. Endotracheal tube, right jugular Swan-Ganz catheter, mediastinal drain, and bilateral chest tubes are unchanged. A right PICC remains in place with tip partially obscured by overlying support devices but likely at the cavoatrial junction or in the high right atrium. Cannulae for external ventricular assist device remain in place. Left pleural effusion and patchy left basilar airspace opacity are unchanged. No pneumothorax is identified. IMPRESSION: 1. Support devices as above. 2. Unchanged left pleural effusion and left basilar opacity, likely atelectasis. Electronically Signed   By: Logan Bores M.D.   On: 09/06/2017 08:20   ASSESSMENT / PLAN:  PULMONARY A: 09/07/2017 -> acute resp failure due to cardiogenic shock  P:   Full vent support. Not able to wean currently  Daily CXR and ABG   CARDIOVASCULAR A:   09/07/2017 -> cardiogenic shock  P:  Per cards and cvts  RENAL  Intake/Output Summary (Last 24 hours) at 09/07/17 0831 Last data filed at 09/07/17 0800  Gross per 24 hour  Intake          6011.14 ml  Output             6425 ml  Net          -413.86 ml    Recent Labs Lab 09/05/17 0346 09/05/17 1558 09/06/17 0500 09/06/17 1659 09/07/17 0206  CREATININE 1.37* 1.70* 1.62* 1.70* 1.72*     A:    09/07/2017 -> Acute Kidney failure  Remains in neg balance with lasix   P:    Monitor renal function Trend UOP Replace electrolytes as needed   GASTROINTESTINAL A:   Malnutrition , tolerating TF  GIB -drk blood in OGT    P:   TF PPI q12   HEMATOLOGIC  Recent Labs Lab 09/06/17 1700 09/06/17 2338 09/07/17 0002 09/07/17 0206  HGB 7.7* 9.0* 7.8* 7.4*  HCT 23.8* 26.5* 23.0* 22.2*  WBC 16.9* 17.3*  --  17.1*  PLT 191 175  --  186    A:   #RBC: anemia criticall illness>hbg drop /GIB s/p 4 u PRBC 9/29 -9/30 #Platelet *normal #WBC high  P:  - trend CBC - PRBC for hgb </= 8-8.5gm%      INFECTIOUS No results for input(s): PROCALCITON in the last 168 hours.  Results for orders placed or performed during the hospital encounter of 08/23/17  MRSA PCR Screening     Status: None   Collection Time: 08/23/17  9:00 PM  Result Value Ref Range Status   MRSA by PCR NEGATIVE NEGATIVE Final    Comment:        The GeneXpert MRSA Assay (FDA approved for NASAL specimens only), is one component of a comprehensive MRSA colonization surveillance program. It is not intended to diagnose MRSA infection nor to guide or monitor treatment for MRSA infections.   Surgical pcr screen     Status: Abnormal   Collection Time: 08/24/17  9:54 AM  Result Value Ref Range Status   MRSA, PCR NEGATIVE NEGATIVE Final   Staphylococcus aureus POSITIVE (A) NEGATIVE Final    Comment: (NOTE) The Xpert SA Assay (FDA approved for NASAL specimens in patients 22 years of age and older), is one component of a comprehensive surveillance program. It is not intended to diagnose infection nor to guide or monitor treatment.   Culture, respiratory (NON-Expectorated)     Status: None   Collection Time: 08/28/17  3:42 PM  Result Value Ref Range Status   Specimen Description TRACHEAL ASPIRATE  Final   Special Requests NONE  Final   Gram Stain   Final    RARE SQUAMOUS EPITHELIAL CELLS PRESENT NO WBC SEEN FEW GRAM POSITIVE COCCI    Culture Consistent with normal respiratory flora.   Final   Report Status 08/29/2017 FINAL  Final  Culture, respiratory (NON-Expectorated)     Status: None   Collection Time: 08/30/17 11:27 AM  Result Value Ref Range Status   Specimen Description TRACHEAL ASPIRATE  Final   Special Requests NONE  Final   Gram Stain NO WBC SEEN NO ORGANISMS SEEN   Final   Culture Consistent with normal respiratory flora.  Final   Report Status 09/01/2017 FINAL  Final  Urine Culture     Status: None   Collection Time: 09/01/17  8:56 AM  Result Value Ref Range Status   Specimen Description URINE, CATHETERIZED  Final   Special Requests Normal  Final   Culture NO GROWTH  Final   Report Status 09/02/2017 FINAL  Final  Culture, respiratory (NON-Expectorated)     Status: None   Collection Time: 09/01/17 11:40 AM  Result Value Ref Range Status   Specimen Description TRACHEAL ASPIRATE  Final   Special Requests Normal  Final   Gram Stain   Final    ABUNDANT WBC PRESENT,BOTH PMN AND MONONUCLEAR NO ORGANISMS SEEN    Culture Consistent with normal respiratory flora.  Final   Report Status 09/03/2017 FINAL  Final  Culture, blood (Routine X 2) w Reflex to ID Panel     Status: None   Collection Time: 09/01/17  3:55 PM  Result Value Ref Range Status   Specimen Description BLOOD LEFT HAND  Final   Special Requests   Final    BOTTLES DRAWN AEROBIC ONLY Blood  Culture adequate volume   Culture NO GROWTH 5 DAYS  Final   Report Status 09/06/2017 FINAL  Final  Culture, blood (Routine X 2) w Reflex to ID Panel     Status: None   Collection Time: 09/01/17  3:55 PM  Result Value Ref Range Status   Specimen Description BLOOD LEFT HAND  Final   Special Requests IN PEDIATRIC BOTTLE Blood Culture adequate volume  Final   Culture NO GROWTH 5 DAYS  Final   Report Status 09/06/2017 FINAL  Final  Culture, blood (Routine X 2) w Reflex to ID Panel     Status: None (Preliminary result)   Collection Time: 09/05/17  3:03 PM  Result Value Ref Range Status   Specimen Description  BLOOD LEFT HAND  Final   Special Requests IN PEDIATRIC BOTTLE Blood Culture adequate volume  Final   Culture NO GROWTH < 24 HOURS  Final   Report Status PENDING  Incomplete  Culture, blood (Routine X 2) w Reflex to ID Panel     Status: None (Preliminary result)   Collection Time: 09/05/17  3:05 PM  Result Value Ref Range Status   Specimen Description BLOOD LEFT WRIST  Final   Special Requests IN PEDIATRIC BOTTLE Blood Culture adequate volume  Final   Culture  Setup Time   Final    GRAM POSITIVE COCCI IN CLUSTERS IN PEDIATRIC BOTTLE CRITICAL RESULT CALLED TO, READ BACK BY AND VERIFIED WITH: Ellin Mayhew BELL [phone] MLM    Culture GRAM POSITIVE COCCI  Final   Report Status PENDING  Incomplete  Blood Culture ID Panel (Reflexed)     Status: Abnormal   Collection Time: 09/05/17  3:05 PM  Result Value Ref Range Status   Enterococcus species NOT DETECTED NOT DETECTED Final   Listeria monocytogenes NOT DETECTED NOT DETECTED Final   Staphylococcus species DETECTED (A) NOT DETECTED Final    Comment: Methicillin (oxacillin) resistant coagulase negative staphylococcus. Possible blood culture contaminant (unless isolated from more than one blood culture draw or clinical case suggests pathogenicity). No antibiotic treatment is indicated for blood  culture contaminants. CRITICAL RESULT CALLED TO, READ BACK BY AND VERIFIED WITH: PHARMD M BELL [phone] MLM    Staphylococcus aureus NOT DETECTED NOT DETECTED Final   Methicillin resistance DETECTED (A) NOT DETECTED Final    Comment: CRITICAL RESULT CALLED TO, READ BACK BY AND VERIFIED WITH: PHARMD M BELL [phone] MLM    Streptococcus species NOT DETECTED NOT DETECTED Final   Streptococcus agalactiae NOT DETECTED NOT DETECTED Final   Streptococcus pneumoniae NOT DETECTED NOT DETECTED Final   Streptococcus pyogenes NOT DETECTED NOT DETECTED Final   Acinetobacter baumannii NOT DETECTED NOT DETECTED Final   Enterobacteriaceae species NOT DETECTED  NOT DETECTED Final   Enterobacter cloacae complex NOT DETECTED NOT DETECTED Final   Escherichia coli NOT DETECTED NOT DETECTED Final   Klebsiella oxytoca NOT DETECTED NOT DETECTED Final   Klebsiella pneumoniae NOT DETECTED NOT DETECTED Final   Proteus species NOT DETECTED NOT DETECTED Final   Serratia marcescens NOT DETECTED NOT DETECTED Final   Haemophilus influenzae NOT DETECTED NOT DETECTED Final   Neisseria meningitidis NOT DETECTED NOT DETECTED Final   Pseudomonas aeruginosa NOT DETECTED NOT DETECTED Final   Candida albicans NOT DETECTED NOT DETECTED Final   Candida glabrata NOT DETECTED NOT DETECTED Final   Candida krusei NOT DETECTED NOT DETECTED Final   Candida parapsilosis NOT DETECTED NOT DETECTED Final   Candida tropicalis NOT DETECTED NOT DETECTED Final  A:   Possible PNA  ?MRSA Bactermia  P:   Cont on Vanc /Zosyn  Follow cx data    Anti-infectives    Start     Dose/Rate Route Frequency Ordered Stop   09/05/17 1230  piperacillin-tazobactam (ZOSYN) IVPB 3.375 g     3.375 g 12.5 mL/hr over 240 Minutes Intravenous Every 8 hours 09/05/17 1143     09/03/17 1800  vancomycin (VANCOCIN) 1,500 mg in sodium chloride 0.9 % 250 mL IVPB     1,500 mg 125 mL/hr over 120 Minutes Intravenous Every 24 hours 09/03/17 0943     09/02/17 2200  vancomycin (VANCOCIN) 1,000 mg in sodium chloride 0.9 % 100 mL IVPB  Status:  Discontinued     1,000 mg 100 mL/hr over 60 Minutes Intravenous Every 12 hours 09/02/17 1323 09/03/17 0942   09/01/17 2200  vancomycin (VANCOCIN) IVPB 1000 mg/200 mL premix  Status:  Discontinued     1,000 mg 200 mL/hr over 60 Minutes Intravenous Every 12 hours 09/01/17 1118 09/01/17 1118   09/01/17 2200  vancomycin (VANCOCIN) 1,000 mg in sodium chloride 0.9 % 250 mL IVPB  Status:  Discontinued     1,000 mg 250 mL/hr over 60 Minutes Intravenous Every 12 hours 09/01/17 1122 09/01/17 1454   09/01/17 2200  vancomycin (VANCOCIN) IVPB 1000 mg/200 mL premix  Status:   Discontinued     1,000 mg 200 mL/hr over 60 Minutes Intravenous Every 12 hours 09/01/17 1454 09/02/17 1323   08/30/17 1400  cefTAZidime (FORTAZ) 1 g in dextrose 5 % 50 mL IVPB  Status:  Discontinued     1 g 100 mL/hr over 30 Minutes Intravenous Every 8 hours 08/30/17 1036 09/05/17 1143   08/29/17 0830  vancomycin (VANCOCIN) IVPB 1000 mg/200 mL premix  Status:  Discontinued     1,000 mg 200 mL/hr over 60 Minutes Intravenous Every 12 hours 08/28/17 1545 09/01/17 1117   08/28/17 2030  vancomycin (VANCOCIN) IVPB 1000 mg/200 mL premix     1,000 mg 200 mL/hr over 60 Minutes Intravenous  Once 08/28/17 1502 08/28/17 2300   08/28/17 2000  cefUROXime (ZINACEF) 1.5 g in dextrose 5 % 50 mL IVPB  Status:  Discontinued     1.5 g 100 mL/hr over 30 Minutes Intravenous Every 12 hours 08/28/17 1502 08/30/17 1036   08/28/17 0400  vancomycin (VANCOCIN) 1,500 mg in sodium chloride 0.9 % 250 mL IVPB     1,500 mg 125 mL/hr over 120 Minutes Intravenous To Surgery 08/27/17 1000 08/28/17 1010   08/28/17 0400  cefUROXime (ZINACEF) 1.5 g in dextrose 5 % 50 mL IVPB     1.5 g 100 mL/hr over 30 Minutes Intravenous To Surgery 08/27/17 1000 08/28/17 1230   08/28/17 0400  cefUROXime (ZINACEF) 750 mg in dextrose 5 % 50 mL IVPB  Status:  Discontinued     750 mg 100 mL/hr over 30 Minutes Intravenous To Surgery 08/27/17 1000 08/28/17 1500       ENDOCRINE A:   hyperglyucemia improved after reducing stress dose steroids P:   ICU hyperglycemia protocol continue hydrocortisone 50 mg daily   NEUROLOGIC A:   #Current: follows commands on wua per nursing  P:   RASS goal: 0 to -3 fent and versed gtt to continue   FAMILY  - Updates: 09/07/2017 --> none at bedside   - Inter-disciplinary family meet or Palliative Care meeting due by:  Ongoing.  Tammy Parrett NP-C  [facility]  [phone]  09/07/2017, 8:31 AM  Attending Note:  I have examined patient, reviewed labs, studies and notes. I  have discussed the case with T Parrett, and I agree with the data and plans as amended above. Cc 66-year-old man with coronary disease, hypertension, diabetes, tobacco use. Sustained acute myocardial infarction and RV infarct, underwent emergency CABG and RVAD placement. He has remained labile, has bilateral infiltrates and a 20-30% left sided pneumothorax with chest tube in place. There is blood coming from chest tubes and OG tube. He is on anticoagulation for his assist device. Alkalosis noted on ABG from this morning. On my evaluation he is ill-appearing, sedated, intubated. He has bilateral chest tubes with some bloody drainage. RV assist device appears to be operational. Mechanical heart sounds. Coarse breath sounds bilaterally. Blood-tinged gastric secretions noted. We will need to follow his left pneumothorax with serial films, he may need increased suction versus tube adjustment. I will decrease his tidal volumes to 8 mL/kg (560 mL), decreased respiratory rate to 20 although he is breathing over the set rate. Continue currently later support area did follow serial CBCs. Clearly we cannot stop his heparin while RVAD is in place. Unclear whether there would be any benefit to consulting GI for his bloody gastric secretions.  Independent critical care time is 33 minutes.   Baltazar Apo, MD, PhD 09/07/2017, 1:36 PM [facility] [phone] or if no answer [phone]

## 2017-09-07 NOTE — Progress Notes (Signed)
Patient ID: Clinton Harrington, male   DOB: [birth year], 66 y.o.   MRN: [id number]     Advanced Heart Failure Rounding Note   Subjective:    Patient remains intubated and sedated.  IABP removed 9/24.  Centrimag RVAD remains in place, speed down to 2600 rpm.  Increased chest tube output and dark blood from OG tube, required 4 units PRBCs yesterday with mild increase in pressor requirement. On Epi 3, milrionone 0.2, Norepi 14, and amio 30 mg/hr. Co-ox 89% (likely inaccurate).   I/Os even with chest tube output, weight up 2 lbs.    Remains intubated and sedated. Wakes up with sedation wean.   He remains on vanc/Zosyn, blood cultures 9/28 with MRSA.   Successful DCCV to NSR 9/29, remains in NSR.    Swan numbers: CVP 10  PA 22/17 CI 2.5  Centrimag interrogated personally 2600 rpm Flow 3.5 L/min  Echo (09/01/17): EF 65-70% with moderate LV hypertrophy, mildly dilated RV with moderately decreased systolic function, IVC dilated.   Objective:   Weight Range: 210 lb 1.6 oz (95.3 kg) Body mass index is 29.3 kg/m.   Vital Signs:   Temp:  [97.3 F (36.3 C)-100.8 F (38.2 C)] 97.3 F (36.3 C) (09/30 0800) Pulse Rate:  [45-123] 92 (09/30 0800) Resp:  [7-29] 25 (09/30 0800) BP: (85-120)/(43-60) 101/46 (09/30 0700) SpO2:  [96 %-100 %] 98 % (09/30 0800) Arterial Line BP: (90-153)/(26-58) 117/44 (09/30 0800) FiO2 (%):  [40 %] 40 % (09/30 0615) Weight:  [210 lb 1.6 oz (95.3 kg)] 210 lb 1.6 oz (95.3 kg) (09/30 0500) Last BM Date: 08/30/47  Weight change: Filed Weights   09/05/17 0600 09/06/17 0500 09/07/17 0500  Weight: 209 lb 14.1 oz (95.2 kg) 208 lb 1.8 oz (94.4 kg) 210 lb 1.6 oz (95.3 kg)    Intake/Output:   Intake/Output Summary (Last 24 hours) at 09/07/17 0800 Last data filed at 09/07/17 0800  Gross per 24 hour  Intake          5788.19 ml  Output             6205 ml  Net          -416.81 ml      Physical Exam    General:  Intubated sedate. Responds to pain.  HEENT:  normal + ETT with coffee grounds Neck: supple. RIJ swan Carotids 2+ bilat; no bruits. No lymphadenopathy or thryomegaly appreciated. Cor: chest dressing c/d/i. Centrimag and CT in place Tachy regular. RVAD hum  Lungs: clear Abdomen: soft, nontender, nondistended. No hepatosplenomegaly. No bruits or masses. Hypoactive bowel sounds. Extremities: no cyanosis, clubbing, rash, 1+ edema Neuro: intubated sedate   Telemetry   Atypical atrial flutter, rate 110-120. Personally reviewed.   EKG    9/25 (personally reviewed): Atypical flutter, RBBB  Labs    CBC  Recent Labs  09/06/17 2338 09/07/17 0002 09/07/17 0206  WBC 17.3*  --  17.1*  HGB 9.0* 7.8* 7.4*  HCT 26.5* 23.0* 22.2*  MCV 86.6  --  86.7  PLT 175  --  782   Basic Metabolic Panel  Recent Labs  09/05/17 0346  09/06/17 0500  09/06/17 1659 09/07/17 0002 09/07/17 0206  NA 137  < > 136  < > 133* 136 135  K 3.6  < > 3.8  < > 4.3 3.8 3.6  CL 93*  < > 94*  --  93*  --  99*  CO2 33*  --  31  --   --   --  27  GLUCOSE 90  < > 91  < > 175* 102* 97  BUN 55*  < > 54*  --  60*  --  63*  CREATININE 1.37*  < > 1.62*  --  1.70*  --  1.72*  CALCIUM 8.6*  --  8.1*  --   --   --  7.9*  MG 1.7  --   --   --   --   --   --   < > = values in this interval not displayed. Liver Function Tests  Recent Labs  09/06/17 0500 09/07/17 0206  AST 45* 41  ALT 18 15*  ALKPHOS 59 83  BILITOT 0.6 0.8  PROT 5.8* 4.9*  ALBUMIN 2.2* 1.9*   No results for input(s): LIPASE, AMYLASE in the last 72 hours. Cardiac Enzymes No results for input(s): CKTOTAL, CKMB, CKMBINDEX, TROPONINI in the last 72 hours.  BNP: BNP (last 3 results)  Recent Labs  08/23/17 1927  BNP 200.0*    ProBNP (last 3 results) No results for input(s): PROBNP in the last 8760 hours.   D-Dimer No results for input(s): DDIMER in the last 72 hours. Hemoglobin A1C No results for input(s): HGBA1C in the last 72 hours. Fasting Lipid Panel No results for input(s):  CHOL, HDL, LDLCALC, TRIG, CHOLHDL, LDLDIRECT in the last 72 hours. Thyroid Function Tests No results for input(s): TSH, T4TOTAL, T3FREE, THYROIDAB in the last 72 hours.  Invalid input(s): FREET3  Other results:   Imaging    No results found.   Medications:     Scheduled Medications: . aspirin EC  325 mg Oral Daily   Or  . aspirin  324 mg Per Tube Daily  . atorvastatin  80 mg Per Tube q1800  . brimonidine  1 drop Right Eye BID  . chlorhexidine gluconate (MEDLINE KIT)  15 mL Mouth Rinse BID  . Chlorhexidine Gluconate Cloth  6 each Topical Q0600  . digoxin  0.125 mg Oral Daily  . feeding supplement (PRO-STAT SUGAR FREE 64)  30 mL Per Tube TID  . furosemide  20 mg Intravenous BID  . hydrocortisone sod succinate (SOLU-CORTEF) inj  50 mg Intravenous Daily  . insulin detemir  55 Units Subcutaneous BID  . mouth rinse  15 mL Mouth Rinse 10 times per day  . mupirocin ointment  1 application Topical BID  . pantoprazole (PROTONIX) IV  40 mg Intravenous Q12H  . sodium chloride flush  10-40 mL Intracatheter Q12H  . sodium chloride flush  3 mL Intravenous Q12H    Infusions: . sodium chloride    . sodium chloride 10 mL (09/01/17 0800)  . sodium chloride 10 mL/hr at 09/07/17 0800  . sodium chloride 10 mL/hr at 09/07/17 0800  . sodium chloride    . sodium chloride    . amiodarone 30 mg/hr (09/07/17 0800)  . chlorproMAZINE (THORAZINE) IV 12.5 mg (09/07/17 0600)  . EPINEPHrine 4 mg in dextrose 5% 250 mL infusion (16 mcg/mL) 3.013 mcg/min (09/07/17 0800)  . feeding supplement (VITAL 1.5 CAL) 1,000 mL (09/07/17 0800)  . fentaNYL infusion INTRAVENOUS 400 mcg/hr (09/07/17 0800)  . heparin 1,100 Units/hr (09/07/17 0800)  . insulin (NOVOLIN-R) infusion 3.2 mL/hr at 09/07/17 0800  . midazolam (VERSED) infusion 6 mg/hr (09/07/17 0800)  . milrinone 0.2 mcg/kg/min (09/07/17 0800)  . norepinephrine (LEVOPHED) Adult infusion 13.973 mcg/min (09/07/17 0800)  . piperacillin-tazobactam (ZOSYN)  IV  3.375 g (09/07/17 0550)  . potassium chloride 10 mEq (09/07/17 0550)  . vancomycin  Stopped (09/06/17 2039)  . vasopressin (PITRESSIN) infusion - *FOR SHOCK* Stopped (09/02/17 0730)    PRN Medications: acetaminophen (TYLENOL) oral liquid 160 mg/5 mL, bisacodyl, chlorproMAZINE (THORAZINE) IV, fentaNYL, fentaNYL (SUBLIMAZE) injection, metoprolol tartrate, midazolam, midazolam, ondansetron (ZOFRAN) IV, potassium chloride, sodium chloride flush, sodium chloride flush    Patient Profile   Mr Garringer is a 66 year old with a history of DMII, smoker, CAD S/P PCI RCA 20 years ago, HTN, PAD, and hyperlipidemia admitted with CP and inferior MI with RV infarction.  He underwent LHC with IABP. After optimized, underwent CABG x4 centrimag for RV infarct on 9/20 and continued IABP.   Assessment/Plan   1. Post-cardiotomy cardiogenic shock: Primarily RV failure from inferior infarct with RV involvement.  Repeat echo on 9/24 showed vigorous LV systolic function with moderate RV dysfunction.  Now s/p CABG and Centrimag for RV support on 9/20. IABP removed 9/24 with good LV function.  Centrimag support, at 2400 rpm this aim. CO stable.  With blood loss, has required higher norepinephrine. CVP 10.  - Continue Centrimag at 2400 rpm until explant.  - Continue current norepinephrine, epinephrine, milrinone.  Wean norepinephrine as BP tolerates. - He will be getting 2 units PRBCs this morning, will up Lasix to 40 mg IV bid.  - Current plan for RVAD explant tomorrow.   2. CAD: Inferior MI with RV involvement at presentation, now s/p CABG.  He is getting ASA.   - Continue statin. No change.  3. Atrial fibrillation/flutter: DCCV 9/29, now remains in NSR.  - Continue amiodarone gtt.  4. AKI: Creatinine up slightly this am. Continue to follow.  5. Diabetes: Difficult to control, appreciate help from diabetes coordinator. No change.  6. Hyponatremia: Na stable at 135 this am after several doses of tolvaptan this  admission.  7. ID: MRSA on 9/28 blood culture, possible HCAP.  -Covering for HCAP with vancomycin/Zosyn.  8. Anemia: Received 2 uPRBCs 09/04/17. 4 units PRBCs on 9/29 with increased chest tube output and dark blood from OGT.  - Hgb 7.4 today, will give 2 units PRBCs.  9. Coffee grounds in NGT - Continue IV protonix  CRITICAL CARE Performed by: Loralie Champagne  Total critical care time: 35 minutes  Critical care time was exclusive of separately billable procedures and treating other patients.  Critical care was necessary to treat or prevent imminent or life-threatening deterioration.  Critical care was time spent personally by me (independent of midlevel providers or residents) on the following activities: development of treatment plan with patient and/or surrogate as well as nursing, discussions with consultants, evaluation of patient's response to treatment, examination of patient, obtaining history from patient or surrogate, ordering and performing treatments and interventions, ordering and review of laboratory studies, ordering and review of radiographic studies, pulse oximetry and re-evaluation of patient's condition.    Loralie Champagne, MD  09/07/2017, 8:00 AM  Advanced Heart Failure Team Pager [phone] (M-F; 7a - 4p)  Please contact [facility] for night-coverage after hours (4p -7a ) and weekends on [email or web address]

## 2017-09-07 NOTE — Progress Notes (Addendum)
Hgb 8.2, give 1 unit RBC and 1 gram of calcium chloride per verbal order from Dr. Tyrone Sage.  Will be 5th unit of RBC today.  CT output 1330 OG output 200  Jashanti Clinkscale, Geroge Baseman, RN

## 2017-09-08 ENCOUNTER — Inpatient Hospital Stay (HOSPITAL_COMMUNITY): Payer: PPO

## 2017-09-08 ENCOUNTER — Inpatient Hospital Stay (HOSPITAL_COMMUNITY): Payer: PPO | Admitting: Certified Registered Nurse Anesthetist

## 2017-09-08 ENCOUNTER — Encounter (HOSPITAL_COMMUNITY)
Admission: EM | Disposition: A | Discharge status: Nursing Home (Includes ICF) | Payer: Self-pay | Source: Home / Self Care | Attending: Cardiothoracic Surgery

## 2017-09-08 DIAGNOSIS — I509 Heart failure, unspecified: Secondary | ICD-10-CM

## 2017-09-08 DIAGNOSIS — I25110 Atherosclerotic heart disease of native coronary artery with unstable angina pectoris: Secondary | ICD-10-CM

## 2017-09-08 DIAGNOSIS — G9340 Encephalopathy, unspecified: Secondary | ICD-10-CM

## 2017-09-08 HISTORY — PX: TEE WITHOUT CARDIOVERSION: SHX5443

## 2017-09-08 HISTORY — PX: REMOVAL OF CENTRIMAG VENTRICULAR ASSIST DEVICE: SHX6220

## 2017-09-08 LAB — POCT I-STAT 3, ART BLOOD GAS (G3+)
ACID-BASE EXCESS: 1 mmol/L (ref 0.0–2.0)
ACID-BASE EXCESS: 4 mmol/L — AB (ref 0.0–2.0)
Acid-Base Excess: 1 mmol/L (ref 0.0–2.0)
BICARBONATE: 25.7 mmol/L (ref 20.0–28.0)
BICARBONATE: 29.2 mmol/L — AB (ref 20.0–28.0)
BICARBONATE: 25.9 mmol/L (ref 20.0–28.0)
Bicarbonate: 27.3 mmol/L (ref 20.0–28.0)
O2 SAT: 87 %
O2 SAT: 96 %
O2 SAT: 96 %
O2 Saturation: 100 %
PCO2 ART: 39.8 mmHg (ref 32.0–48.0)
PCO2 ART: 45.6 mmHg (ref 32.0–48.0)
PCO2 ART: 45.7 mmHg (ref 32.0–48.0)
PH ART: 7.331 — AB (ref 7.350–7.450)
PH ART: 7.414 (ref 7.350–7.450)
PO2 ART: 78 mmHg — AB (ref 83.0–108.0)
PO2 ART: 329 mmHg — AB (ref 83.0–108.0)
Patient temperature: 36.8
Patient temperature: 36.9
TCO2: 27 mmol/L (ref 22–32)
TCO2: 27 mmol/L (ref 22–32)
TCO2: 29 mmol/L (ref 22–32)
TCO2: 31 mmol/L (ref 22–32)
pCO2 arterial: 51.7 mmHg — ABNORMAL HIGH (ref 32.0–48.0)
pH, Arterial: 7.416 (ref 7.350–7.450)
pH, Arterial: 7.36 (ref 7.350–7.450)
pO2, Arterial: 58 mmHg — ABNORMAL LOW (ref 83.0–108.0)
pO2, Arterial: 83 mmHg (ref 83.0–108.0)

## 2017-09-08 LAB — POCT ACTIVATED CLOTTING TIME
ACTIVATED CLOTTING TIME: 147 s
ACTIVATED CLOTTING TIME: 153 s
Activated Clotting Time: 153 seconds
Activated Clotting Time: 153 seconds

## 2017-09-08 LAB — BASIC METABOLIC PANEL
Anion gap: 8 (ref 5–15)
BUN: 64 mg/dL — ABNORMAL HIGH (ref 6–20)
CO2: 23 mmol/L (ref 22–32)
Calcium: 7.3 mg/dL — ABNORMAL LOW (ref 8.9–10.3)
Chloride: 102 mmol/L (ref 101–111)
Creatinine, Ser: 1.62 mg/dL — ABNORMAL HIGH (ref 0.61–1.24)
GFR calc Af Amer: 51 mL/min — ABNORMAL LOW (ref >60)
GFR calc non Af Amer: 44 mL/min — ABNORMAL LOW (ref >60)
Glucose, Bld: 87 mg/dL (ref 65–99)
Potassium: 4.8 mmol/L (ref 3.5–5.1)
Sodium: 133 mmol/L — ABNORMAL LOW (ref 135–145)

## 2017-09-08 LAB — GLUCOSE, CAPILLARY
GLUCOSE-CAPILLARY: 100 mg/dL — AB (ref 65–99)
GLUCOSE-CAPILLARY: 60 mg/dL — AB (ref 65–99)
GLUCOSE-CAPILLARY: 69 mg/dL (ref 65–99)
GLUCOSE-CAPILLARY: 82 mg/dL (ref 65–99)
GLUCOSE-CAPILLARY: 89 mg/dL (ref 65–99)
GLUCOSE-CAPILLARY: 94 mg/dL (ref 65–99)
Glucose-Capillary: 68 mg/dL (ref 65–99)
Glucose-Capillary: 64 mg/dL — ABNORMAL LOW (ref 65–99)
Glucose-Capillary: 81 mg/dL (ref 65–99)
Glucose-Capillary: 70 mg/dL (ref 65–99)
Glucose-Capillary: 63 mg/dL — ABNORMAL LOW (ref 65–99)

## 2017-09-08 LAB — COMPREHENSIVE METABOLIC PANEL
ALT: 18 U/L (ref 17–63)
AST: 60 U/L — ABNORMAL HIGH (ref 15–41)
Albumin: 1.8 g/dL — ABNORMAL LOW (ref 3.5–5.0)
Alkaline Phosphatase: 52 U/L (ref 38–126)
Anion gap: 8 (ref 5–15)
BUN: 64 mg/dL — ABNORMAL HIGH (ref 6–20)
CO2: 24 mmol/L (ref 22–32)
Calcium: 7.3 mg/dL — ABNORMAL LOW (ref 8.9–10.3)
Chloride: 102 mmol/L (ref 101–111)
Creatinine, Ser: 1.67 mg/dL — ABNORMAL HIGH (ref 0.61–1.24)
GFR calc Af Amer: 49 mL/min — ABNORMAL LOW (ref >60)
GFR calc non Af Amer: 42 mL/min — ABNORMAL LOW (ref >60)
Glucose, Bld: 75 mg/dL (ref 65–99)
Potassium: 3.9 mmol/L (ref 3.5–5.1)
Sodium: 134 mmol/L — ABNORMAL LOW (ref 135–145)
Total Bilirubin: 0.7 mg/dL (ref 0.3–1.2)
Total Protein: 5.1 g/dL — ABNORMAL LOW (ref 6.5–8.1)

## 2017-09-08 LAB — TYPE AND SCREEN
ABO/RH(D): O POS
Antibody Screen: NEGATIVE
Unit division: 0
Unit division: 0
Unit division: 0
Unit division: 0
Unit division: 0
Unit division: 0
Unit division: 0
Unit division: 0
Unit division: 0

## 2017-09-08 LAB — POCT I-STAT, CHEM 8
BUN: 61 mg/dL — ABNORMAL HIGH (ref 6–20)
BUN: 58 mg/dL — ABNORMAL HIGH (ref 6–20)
BUN: 59 mg/dL — ABNORMAL HIGH (ref 6–20)
CALCIUM ION: 1.05 mmol/L — AB (ref 1.15–1.40)
CHLORIDE: 98 mmol/L — AB (ref 101–111)
Calcium, Ion: 1.07 mmol/L — ABNORMAL LOW (ref 1.15–1.40)
Calcium, Ion: 1.08 mmol/L — ABNORMAL LOW (ref 1.15–1.40)
Chloride: 98 mmol/L — ABNORMAL LOW (ref 101–111)
Chloride: 100 mmol/L — ABNORMAL LOW (ref 101–111)
Creatinine, Ser: 1.6 mg/dL — ABNORMAL HIGH (ref 0.61–1.24)
Creatinine, Ser: 1.6 mg/dL — ABNORMAL HIGH (ref 0.61–1.24)
Creatinine, Ser: 1.6 mg/dL — ABNORMAL HIGH (ref 0.61–1.24)
GLUCOSE: 88 mg/dL (ref 65–99)
GLUCOSE: 83 mg/dL (ref 65–99)
Glucose, Bld: 116 mg/dL — ABNORMAL HIGH (ref 65–99)
HCT: 24 % — ABNORMAL LOW (ref 39.0–52.0)
HCT: 28 % — ABNORMAL LOW (ref 39.0–52.0)
HEMATOCRIT: 23 % — AB (ref 39.0–52.0)
HEMOGLOBIN: 7.8 g/dL — AB (ref 13.0–17.0)
HEMOGLOBIN: 8.2 g/dL — AB (ref 13.0–17.0)
Hemoglobin: 9.5 g/dL — ABNORMAL LOW (ref 13.0–17.0)
POTASSIUM: 3.7 mmol/L (ref 3.5–5.1)
POTASSIUM: 5.3 mmol/L — AB (ref 3.5–5.1)
Potassium: 3.7 mmol/L (ref 3.5–5.1)
SODIUM: 137 mmol/L (ref 135–145)
SODIUM: 134 mmol/L — AB (ref 135–145)
Sodium: 136 mmol/L (ref 135–145)
TCO2: 28 mmol/L (ref 22–32)
TCO2: 27 mmol/L (ref 22–32)
TCO2: 24 mmol/L (ref 22–32)

## 2017-09-08 LAB — CBC
HCT: 26.6 % — ABNORMAL LOW (ref 39.0–52.0)
HCT: 26 % — ABNORMAL LOW (ref 39.0–52.0)
HCT: 28.8 % — ABNORMAL LOW (ref 39.0–52.0)
Hemoglobin: 9.1 g/dL — ABNORMAL LOW (ref 13.0–17.0)
Hemoglobin: 9 g/dL — ABNORMAL LOW (ref 13.0–17.0)
Hemoglobin: 9.6 g/dL — ABNORMAL LOW (ref 13.0–17.0)
MCH: 29.5 pg (ref 26.0–34.0)
MCH: 30 pg (ref 26.0–34.0)
MCH: 28.7 pg (ref 26.0–34.0)
MCHC: 34.2 g/dL (ref 30.0–36.0)
MCHC: 34.6 g/dL (ref 30.0–36.0)
MCHC: 33.3 g/dL (ref 30.0–36.0)
MCV: 86.4 fL (ref 78.0–100.0)
MCV: 86.7 fL (ref 78.0–100.0)
MCV: 86.2 fL (ref 78.0–100.0)
PLATELETS: 178 10*3/uL (ref 150–400)
Platelets: 230 10*3/uL (ref 150–400)
Platelets: 195 10*3/uL (ref 150–400)
RBC: 3.08 MIL/uL — ABNORMAL LOW (ref 4.22–5.81)
RBC: 3 MIL/uL — ABNORMAL LOW (ref 4.22–5.81)
RBC: 3.34 MIL/uL — ABNORMAL LOW (ref 4.22–5.81)
RDW: 15.4 % (ref 11.5–15.5)
RDW: 15.2 % (ref 11.5–15.5)
RDW: 15 % (ref 11.5–15.5)
WBC: 20.5 10*3/uL — ABNORMAL HIGH (ref 4.0–10.5)
WBC: 25.3 10*3/uL — AB (ref 4.0–10.5)
WBC: 22.3 10*3/uL — ABNORMAL HIGH (ref 4.0–10.5)

## 2017-09-08 LAB — BPAM RBC
Blood Product Expiration Date: 201810182359
Blood Product Expiration Date: 201810192359
Blood Product Expiration Date: 201810192359
Blood Product Expiration Date: 201810192359
Blood Product Expiration Date: 201810192359
Blood Product Expiration Date: 201810192359
Blood Product Expiration Date: 201810192359
Blood Product Expiration Date: 201810242359
Blood Product Expiration Date: 201810242359
ISSUE DATE / TIME: 201809281105
ISSUE DATE / TIME: 201809290826
ISSUE DATE / TIME: 201809291224
ISSUE DATE / TIME: 201809291811
ISSUE DATE / TIME: 201809292011
ISSUE DATE / TIME: 201809300618
ISSUE DATE / TIME: 201809300815
ISSUE DATE / TIME: 201809301240
ISSUE DATE / TIME: 201809301412
Unit Type and Rh: 5100
Unit Type and Rh: 5100
Unit Type and Rh: 5100
Unit Type and Rh: 5100
Unit Type and Rh: 5100
Unit Type and Rh: 5100
Unit Type and Rh: 5100
Unit Type and Rh: 5100
Unit Type and Rh: 5100

## 2017-09-08 LAB — PREPARE RBC (CROSSMATCH)

## 2017-09-08 LAB — DIGOXIN LEVEL: Digoxin Level: <0.2 ng/mL — ABNORMAL LOW (ref 0.8–2.0)

## 2017-09-08 LAB — PROTIME-INR
INR: 1.61
INR: 1.58
PROTHROMBIN TIME: 18.7 s — AB (ref 11.4–15.2)
Prothrombin Time: 19 seconds — ABNORMAL HIGH (ref 11.4–15.2)

## 2017-09-08 LAB — COOXEMETRY PANEL
Carboxyhemoglobin: 1.6 % — ABNORMAL HIGH (ref 0.5–1.5)
Carboxyhemoglobin: 1.5 % (ref 0.5–1.5)
Methemoglobin: 0.7 % (ref 0.0–1.5)
Methemoglobin: 1.3 % (ref 0.0–1.5)
O2 Saturation: 75.3 %
O2 Saturation: 83.5 %
Total hemoglobin: 8.4 g/dL — ABNORMAL LOW (ref 12.0–16.0)
Total hemoglobin: 9 g/dL — ABNORMAL LOW (ref 12.0–16.0)

## 2017-09-08 LAB — CULTURE, BLOOD (ROUTINE X 2): Special Requests: ADEQUATE

## 2017-09-08 LAB — BLOOD PRODUCT ORDER (VERBAL) VERIFICATION

## 2017-09-08 LAB — CREATININE, SERUM
Creatinine, Ser: 1.69 mg/dL — ABNORMAL HIGH (ref 0.61–1.24)
GFR calc Af Amer: 48 mL/min — ABNORMAL LOW (ref >60)
GFR calc non Af Amer: 41 mL/min — ABNORMAL LOW (ref >60)

## 2017-09-08 LAB — POCT I-STAT 4, (NA,K, GLUC, HGB,HCT)
GLUCOSE: 94 mg/dL (ref 65–99)
HCT: 26 % — ABNORMAL LOW (ref 39.0–52.0)
Hemoglobin: 8.8 g/dL — ABNORMAL LOW (ref 13.0–17.0)
Potassium: 4.2 mmol/L (ref 3.5–5.1)
Sodium: 138 mmol/L (ref 135–145)

## 2017-09-08 LAB — APTT
APTT: 69 s — AB (ref 24–36)
APTT: 39 s — AB (ref 24–36)

## 2017-09-08 LAB — CALCIUM, IONIZED: Calcium, Ionized, Serum: 4.8 mg/dL (ref 4.5–5.6)

## 2017-09-08 LAB — MAGNESIUM: Magnesium: 3.3 mg/dL — ABNORMAL HIGH (ref 1.7–2.4)

## 2017-09-08 LAB — LACTATE DEHYDROGENASE: LDH: 345 U/L — ABNORMAL HIGH (ref 98–192)

## 2017-09-08 LAB — HEPARIN LEVEL (UNFRACTIONATED): HEPARIN UNFRACTIONATED: 0.15 [IU]/mL — AB (ref 0.30–0.70)

## 2017-09-08 SURGERY — REMOVAL, VENTRICULAR ASSIST DEVICE, EXTRACORPOREAL
Anesthesia: General | Site: Chest

## 2017-09-08 MED ORDER — BRIMONIDINE TARTRATE 0.15 % OP SOLN
1.0000 [drp] | Freq: Two times a day (BID) | OPHTHALMIC | Status: DC
  Administered 2017-09-09 – 2017-09-26 (×35): 1 [drp] via OPHTHALMIC
  Filled 2017-09-08: qty 5

## 2017-09-08 MED ORDER — DEXTROSE 10 % IV SOLN
INTRAVENOUS | Status: DC
  Administered 2017-09-08: 50 mL/h via INTRAVENOUS

## 2017-09-08 MED ORDER — SODIUM CHLORIDE 0.9 % IV SOLN
1.0000 mg/h | INTRAVENOUS | Status: DC
  Administered 2017-09-08: 4 mg/h via INTRAVENOUS

## 2017-09-08 MED ORDER — HEMOSTATIC AGENTS (NO CHARGE) OPTIME
TOPICAL | Status: DC | PRN
  Administered 2017-09-08: 1 via TOPICAL

## 2017-09-08 MED ORDER — HEPARIN SODIUM (PORCINE) 1000 UNIT/ML IJ SOLN
INTRAMUSCULAR | Status: DC | PRN
  Administered 2017-09-08: 15000 [IU] via INTRAVENOUS
  Administered 2017-09-08: 5000 [IU] via INTRAVENOUS

## 2017-09-08 MED ORDER — FUROSEMIDE 10 MG/ML IJ SOLN
20.0000 mg | Freq: Two times a day (BID) | INTRAMUSCULAR | Status: DC
  Administered 2017-09-08: 20 mg via INTRAVENOUS
  Filled 2017-09-08: qty 2

## 2017-09-08 MED ORDER — FENTANYL CITRATE (PF) 100 MCG/2ML IJ SOLN: 25.0000 ug | INTRAMUSCULAR | Status: DC | PRN

## 2017-09-08 MED ORDER — ALBUMIN HUMAN 5 % IV SOLN
INTRAVENOUS | Status: DC | PRN
  Administered 2017-09-08: 09:00:00 via INTRAVENOUS

## 2017-09-08 MED ORDER — MAGNESIUM SULFATE 4 GM/100ML IV SOLN
4.0000 g | Freq: Once | INTRAVENOUS | Status: AC
  Administered 2017-09-08: 4 g via INTRAVENOUS
  Filled 2017-09-08: qty 100

## 2017-09-08 MED ORDER — BISACODYL 10 MG RE SUPP
10.0000 mg | Freq: Every day | RECTAL | Status: DC
  Administered 2017-09-09 – 2017-09-14 (×5): 10 mg via RECTAL
  Filled 2017-09-08 (×5): qty 1

## 2017-09-08 MED ORDER — THROMBIN 20000 UNITS EX SOLR
CUTANEOUS | Status: AC
  Filled 2017-09-08: qty 20000

## 2017-09-08 MED ORDER — SODIUM CHLORIDE 0.9 % IV SOLN
30.0000 ug/min | INTRAVENOUS | Status: DC
  Filled 2017-09-08: qty 2

## 2017-09-08 MED ORDER — DEXTROSE 5 % IV SOLN
0.0000 ug/min | INTRAVENOUS | Status: DC
  Filled 2017-09-08 (×2): qty 4

## 2017-09-08 MED ORDER — PROTAMINE SULFATE 10 MG/ML IV SOLN
INTRAVENOUS | Status: DC | PRN
  Administered 2017-09-08: 200 mg via INTRAVENOUS

## 2017-09-08 MED ORDER — FENTANYL CITRATE (PF) 2500 MCG/50ML IJ SOLN
25.0000 ug/h | Status: DC
  Administered 2017-09-09: 200 ug/h via INTRAVENOUS
  Administered 2017-09-10: 50 ug/h via INTRAVENOUS
  Administered 2017-09-11: 100 ug/h via INTRAVENOUS
  Administered 2017-09-12: 150 ug/h via INTRAVENOUS
  Administered 2017-09-14: 25 ug/h via INTRAVENOUS
  Administered 2017-09-14: 50 ug/h via INTRAVENOUS
  Filled 2017-09-08 (×7): qty 100

## 2017-09-08 MED ORDER — DOCUSATE SODIUM 100 MG PO CAPS
200.0000 mg | ORAL_CAPSULE | Freq: Every day | ORAL | Status: DC
  Administered 2017-09-10 – 2017-09-11 (×2): 200 mg via ORAL
  Filled 2017-09-08 (×2): qty 2

## 2017-09-08 MED ORDER — SODIUM CHLORIDE 0.45 % IV SOLN: INTRAVENOUS | Status: DC | PRN

## 2017-09-08 MED ORDER — CHLORHEXIDINE GLUCONATE 0.12% ORAL RINSE (MEDLINE KIT)
15.0000 mL | Freq: Two times a day (BID) | OROMUCOSAL | Status: DC
  Administered 2017-09-08 – 2017-09-09 (×2): 15 mL via OROMUCOSAL

## 2017-09-08 MED ORDER — ONDANSETRON HCL 4 MG/2ML IJ SOLN: 4.0000 mg | Freq: Four times a day (QID) | INTRAMUSCULAR | Status: DC | PRN

## 2017-09-08 MED ORDER — 0.9 % SODIUM CHLORIDE (POUR BTL) OPTIME
TOPICAL | Status: DC | PRN
  Administered 2017-09-08: 1000 mL

## 2017-09-08 MED ORDER — METOPROLOL TARTRATE 5 MG/5ML IV SOLN
2.5000 mg | INTRAVENOUS | Status: DC | PRN
  Administered 2017-09-11: 5 mg via INTRAVENOUS
  Administered 2017-09-15 (×2): 2.5 mg via INTRAVENOUS
  Filled 2017-09-08 (×3): qty 5

## 2017-09-08 MED ORDER — LACTATED RINGERS IV SOLN: INTRAVENOUS | Status: DC

## 2017-09-08 MED ORDER — DEXTROSE 5 % IV SOLN
750.0000 mg | INTRAVENOUS | Status: DC
  Filled 2017-09-08: qty 750

## 2017-09-08 MED ORDER — MIDAZOLAM HCL 2 MG/2ML IJ SOLN: 2.0000 mg | INTRAMUSCULAR | Status: DC | PRN

## 2017-09-08 MED ORDER — DEXMEDETOMIDINE HCL IN NACL 400 MCG/100ML IV SOLN
0.1000 ug/kg/h | INTRAVENOUS | Status: DC
  Filled 2017-09-08: qty 100

## 2017-09-08 MED ORDER — ORAL CARE MOUTH RINSE
15.0000 mL | Freq: Four times a day (QID) | OROMUCOSAL | Status: DC
  Administered 2017-09-08 – 2017-09-09 (×4): 15 mL via OROMUCOSAL

## 2017-09-08 MED ORDER — GELATIN ABSORBABLE MT POWD
OROMUCOSAL | Status: DC | PRN
  Administered 2017-09-08: 1 mL via TOPICAL

## 2017-09-08 MED ORDER — INSULIN REGULAR BOLUS VIA INFUSION
0.0000 [IU] | Freq: Three times a day (TID) | INTRAVENOUS | Status: DC
  Filled 2017-09-08: qty 10

## 2017-09-08 MED ORDER — TRANEXAMIC ACID (OHS) BOLUS VIA INFUSION
15.0000 mg/kg | INTRAVENOUS | Status: DC
  Filled 2017-09-08: qty 1484

## 2017-09-08 MED ORDER — VANCOMYCIN HCL 1000 MG IV SOLR
INTRAVENOUS | Status: AC
  Administered 2017-09-08: 1000 mL
  Filled 2017-09-08: qty 1000

## 2017-09-08 MED ORDER — SODIUM CHLORIDE 0.9 % IV SOLN
INTRAVENOUS | Status: DC
  Administered 2017-09-08 – 2017-09-21 (×6): via INTRAVENOUS

## 2017-09-08 MED ORDER — SODIUM CHLORIDE 0.9 % IV SOLN
INTRAVENOUS | Status: DC | PRN
  Administered 2017-09-08: 09:00:00 via INTRAVENOUS

## 2017-09-08 MED ORDER — PLASMA-LYTE 148 IV SOLN
INTRAVENOUS | Status: DC
  Filled 2017-09-08: qty 2.5

## 2017-09-08 MED ORDER — DOPAMINE-DEXTROSE 3.2-5 MG/ML-% IV SOLN
0.0000 ug/kg/min | INTRAVENOUS | Status: DC
Start: 2017-09-08 — End: 2017-09-08
  Filled 2017-09-08: qty 250

## 2017-09-08 MED ORDER — VANCOMYCIN HCL 10 G IV SOLR
1250.0000 mg | INTRAVENOUS | Status: DC
  Filled 2017-09-08: qty 1250

## 2017-09-08 MED ORDER — BISACODYL 5 MG PO TBEC
10.0000 mg | DELAYED_RELEASE_TABLET | Freq: Every day | ORAL | Status: DC
  Administered 2017-09-10 – 2017-09-19 (×4): 10 mg via ORAL
  Filled 2017-09-08 (×4): qty 2

## 2017-09-08 MED ORDER — NITROGLYCERIN IN D5W 200-5 MCG/ML-% IV SOLN
2.0000 ug/min | INTRAVENOUS | Status: DC
  Filled 2017-09-08: qty 250

## 2017-09-08 MED ORDER — SODIUM CHLORIDE 0.9 % IV SOLN: 250.0000 mL | INTRAVENOUS | Status: DC

## 2017-09-08 MED ORDER — ASPIRIN 81 MG PO CHEW
324.0000 mg | CHEWABLE_TABLET | Freq: Every day | ORAL | Status: DC
  Administered 2017-09-09 – 2017-09-17 (×8): 324 mg
  Filled 2017-09-08 (×8): qty 4

## 2017-09-08 MED ORDER — PANTOPRAZOLE SODIUM 40 MG PO TBEC: 40.0000 mg | DELAYED_RELEASE_TABLET | Freq: Every day | ORAL | Status: DC

## 2017-09-08 MED ORDER — METOPROLOL TARTRATE 12.5 MG HALF TABLET: 12.5000 mg | ORAL_TABLET | Freq: Two times a day (BID) | ORAL | Status: DC

## 2017-09-08 MED ORDER — FENTANYL 2500MCG IN NS 250ML (10MCG/ML) PREMIX INFUSION: 25.0000 ug/h | INTRAVENOUS | Status: DC

## 2017-09-08 MED ORDER — SODIUM CHLORIDE 0.9% FLUSH: 3.0000 mL | INTRAVENOUS | Status: DC | PRN

## 2017-09-08 MED ORDER — FENTANYL CITRATE (PF) 100 MCG/2ML IJ SOLN: 50.0000 ug | Freq: Once | INTRAMUSCULAR | Status: DC

## 2017-09-08 MED ORDER — HYDROCORTISONE NA SUCCINATE PF 100 MG IJ SOLR
50.0000 mg | Freq: Every day | INTRAMUSCULAR | Status: DC
  Administered 2017-09-09 – 2017-09-17 (×9): 50 mg via INTRAVENOUS
  Filled 2017-09-08 (×9): qty 2

## 2017-09-08 MED ORDER — DEXTROSE 50 % IV SOLN
25.0000 mL | Freq: Once | INTRAVENOUS | Status: AC
  Administered 2017-09-08: 25 mL via INTRAVENOUS
  Filled 2017-09-08: qty 50

## 2017-09-08 MED ORDER — POTASSIUM CHLORIDE 10 MEQ/50ML IV SOLN: 10.0000 meq | INTRAVENOUS | Status: AC

## 2017-09-08 MED ORDER — SODIUM CHLORIDE 0.9 % IV SOLN: INTRAVENOUS | Status: DC

## 2017-09-08 MED ORDER — CALCIUM CHLORIDE 10 % IV SOLN
INTRAVENOUS | Status: DC | PRN
  Administered 2017-09-08: 1 g via INTRAVENOUS

## 2017-09-08 MED ORDER — MORPHINE SULFATE (PF) 2 MG/ML IV SOLN: 1.0000 mg | INTRAVENOUS | Status: DC | PRN

## 2017-09-08 MED ORDER — TRAMADOL HCL 50 MG PO TABS: 50.0000 mg | ORAL_TABLET | ORAL | Status: DC | PRN

## 2017-09-08 MED ORDER — FENTANYL BOLUS VIA INFUSION
50.0000 ug | INTRAVENOUS | Status: DC | PRN
  Administered 2017-09-08: 50 ug via INTRAVENOUS
  Administered 2017-09-10: 25 ug via INTRAVENOUS
  Administered 2017-09-11 – 2017-09-14 (×9): 50 ug via INTRAVENOUS
  Filled 2017-09-08: qty 50

## 2017-09-08 MED ORDER — CHLORHEXIDINE GLUCONATE 0.12 % MT SOLN
15.0000 mL | OROMUCOSAL | Status: AC
  Administered 2017-09-08: 15 mL via OROMUCOSAL
  Filled 2017-09-08: qty 15

## 2017-09-08 MED ORDER — OXYCODONE HCL 5 MG PO TABS
5.0000 mg | ORAL_TABLET | ORAL | Status: DC | PRN
  Administered 2017-09-21 (×2): 5 mg via ORAL
  Filled 2017-09-08 (×2): qty 1

## 2017-09-08 MED ORDER — FAMOTIDINE IN NACL 20-0.9 MG/50ML-% IV SOLN
20.0000 mg | Freq: Two times a day (BID) | INTRAVENOUS | Status: AC
  Administered 2017-09-08 (×2): 20 mg via INTRAVENOUS
  Filled 2017-09-08 (×2): qty 50

## 2017-09-08 MED ORDER — MIDAZOLAM HCL 2 MG/2ML IJ SOLN
INTRAMUSCULAR | Status: AC
  Filled 2017-09-08: qty 2

## 2017-09-08 MED ORDER — METOPROLOL TARTRATE 25 MG/10 ML ORAL SUSPENSION
12.5000 mg | Freq: Two times a day (BID) | ORAL | Status: DC
  Administered 2017-09-11 – 2017-09-12 (×3): 12.5 mg
  Filled 2017-09-08 (×3): qty 5

## 2017-09-08 MED ORDER — SODIUM CHLORIDE 0.9% FLUSH
3.0000 mL | Freq: Two times a day (BID) | INTRAVENOUS | Status: DC
  Administered 2017-09-09 – 2017-09-26 (×24): 3 mL via INTRAVENOUS

## 2017-09-08 MED ORDER — INSULIN ASPART 100 UNIT/ML ~~LOC~~ SOLN
0.0000 [IU] | SUBCUTANEOUS | Status: DC
  Administered 2017-09-09: 4 [IU] via SUBCUTANEOUS
  Administered 2017-09-09: 12 [IU] via SUBCUTANEOUS
  Administered 2017-09-09: 20 [IU] via SUBCUTANEOUS
  Administered 2017-09-10: 12 [IU] via SUBCUTANEOUS
  Administered 2017-09-10: 4 [IU] via SUBCUTANEOUS
  Administered 2017-09-10: 12 [IU] via SUBCUTANEOUS
  Administered 2017-09-10: 2 [IU] via SUBCUTANEOUS
  Administered 2017-09-10: 4 [IU] via SUBCUTANEOUS
  Administered 2017-09-10: 16 [IU] via SUBCUTANEOUS
  Administered 2017-09-10: 8 [IU] via SUBCUTANEOUS
  Administered 2017-09-11: 4 [IU] via SUBCUTANEOUS
  Administered 2017-09-11: 2 [IU] via SUBCUTANEOUS
  Administered 2017-09-11: 4 [IU] via SUBCUTANEOUS
  Administered 2017-09-11: 12 [IU] via SUBCUTANEOUS
  Administered 2017-09-11: 2 [IU] via SUBCUTANEOUS
  Administered 2017-09-12: 8 [IU] via SUBCUTANEOUS
  Administered 2017-09-12: 4 [IU] via SUBCUTANEOUS
  Administered 2017-09-12: 8 [IU] via SUBCUTANEOUS
  Administered 2017-09-12: 4 [IU] via SUBCUTANEOUS
  Administered 2017-09-12 – 2017-09-13 (×5): 8 [IU] via SUBCUTANEOUS
  Administered 2017-09-13: 4 [IU] via SUBCUTANEOUS
  Administered 2017-09-13: 2 [IU] via SUBCUTANEOUS
  Administered 2017-09-13: 12 [IU] via SUBCUTANEOUS
  Administered 2017-09-13: 4 [IU] via SUBCUTANEOUS
  Administered 2017-09-14: 12 [IU] via SUBCUTANEOUS
  Administered 2017-09-14: 8 [IU] via SUBCUTANEOUS
  Administered 2017-09-14: 4 [IU] via SUBCUTANEOUS
  Administered 2017-09-14 – 2017-09-15 (×4): 8 [IU] via SUBCUTANEOUS
  Administered 2017-09-16 – 2017-09-17 (×4): 2 [IU] via SUBCUTANEOUS
  Administered 2017-09-17: 4 [IU] via SUBCUTANEOUS
  Administered 2017-09-17: 2 [IU] via SUBCUTANEOUS
  Administered 2017-09-17: 4 [IU] via SUBCUTANEOUS
  Administered 2017-09-17: 2 [IU] via SUBCUTANEOUS
  Administered 2017-09-18: 4 [IU] via SUBCUTANEOUS
  Administered 2017-09-18 (×4): 8 [IU] via SUBCUTANEOUS
  Administered 2017-09-18: 4 [IU] via SUBCUTANEOUS
  Administered 2017-09-19 (×3): 8 [IU] via SUBCUTANEOUS
  Administered 2017-09-19 (×2): 4 [IU] via SUBCUTANEOUS
  Administered 2017-09-19: 8 [IU] via SUBCUTANEOUS
  Administered 2017-09-20: 4 [IU] via SUBCUTANEOUS
  Administered 2017-09-20 (×2): 12 [IU] via SUBCUTANEOUS
  Administered 2017-09-20 (×2): 4 [IU] via SUBCUTANEOUS
  Administered 2017-09-20: 2 [IU] via SUBCUTANEOUS
  Administered 2017-09-21: 8 [IU] via SUBCUTANEOUS
  Administered 2017-09-21: 12 [IU] via SUBCUTANEOUS
  Administered 2017-09-21: 8 [IU] via SUBCUTANEOUS
  Administered 2017-09-21: 16 [IU] via SUBCUTANEOUS
  Administered 2017-09-21: 4 [IU] via SUBCUTANEOUS
  Administered 2017-09-21: 2 [IU] via SUBCUTANEOUS
  Administered 2017-09-21: 16 [IU] via SUBCUTANEOUS
  Administered 2017-09-22: 4 [IU] via SUBCUTANEOUS
  Administered 2017-09-22: 2 [IU] via SUBCUTANEOUS
  Administered 2017-09-22: 12 [IU] via SUBCUTANEOUS
  Administered 2017-09-22 (×2): 2 [IU] via SUBCUTANEOUS
  Administered 2017-09-23: 8 [IU] via SUBCUTANEOUS
  Administered 2017-09-23: 4 [IU] via SUBCUTANEOUS
  Administered 2017-09-23: 8 [IU] via SUBCUTANEOUS
  Administered 2017-09-23 – 2017-09-24 (×3): 4 [IU] via SUBCUTANEOUS
  Administered 2017-09-24: 2 [IU] via SUBCUTANEOUS
  Administered 2017-09-24 (×2): 4 [IU] via SUBCUTANEOUS
  Administered 2017-09-24: 2 [IU] via SUBCUTANEOUS
  Administered 2017-09-24 (×2): 8 [IU] via SUBCUTANEOUS
  Administered 2017-09-25 (×3): 4 [IU] via SUBCUTANEOUS
  Administered 2017-09-25: 8 [IU] via SUBCUTANEOUS
  Administered 2017-09-25: 2 [IU] via SUBCUTANEOUS
  Administered 2017-09-25 – 2017-09-26 (×2): 4 [IU] via SUBCUTANEOUS
  Administered 2017-09-26: 2 [IU] via SUBCUTANEOUS
  Administered 2017-09-26 (×2): 4 [IU] via SUBCUTANEOUS
  Administered 2017-09-26: 2 [IU] via SUBCUTANEOUS

## 2017-09-08 MED ORDER — MAGNESIUM SULFATE 50 % IJ SOLN
40.0000 meq | INTRAMUSCULAR | Status: DC
  Filled 2017-09-08: qty 10

## 2017-09-08 MED ORDER — LACTATED RINGERS IV SOLN
INTRAVENOUS | Status: DC
  Administered 2017-09-08: 12:00:00 via INTRAVENOUS
  Administered 2017-09-12: 10 mL/h via INTRAVENOUS

## 2017-09-08 MED ORDER — THROMBIN 20000 UNITS EX SOLR
CUTANEOUS | Status: DC | PRN
  Administered 2017-09-08: 20000 [IU] via TOPICAL

## 2017-09-08 MED ORDER — VITAL 1.5 CAL PO LIQD
1000.0000 mL | ORAL | Status: DC
  Filled 2017-09-08: qty 1000

## 2017-09-08 MED ORDER — DEXTROSE 5 % IV SOLN
1.5000 g | INTRAVENOUS | Status: DC
Start: 2017-09-08 — End: 2017-09-08
  Filled 2017-09-08: qty 1.5

## 2017-09-08 MED ORDER — SODIUM CHLORIDE 0.9 % IV SOLN
0.0000 ug/kg/h | INTRAVENOUS | Status: DC
  Administered 2017-09-08: 0.7 ug/kg/h via INTRAVENOUS
  Filled 2017-09-08: qty 2

## 2017-09-08 MED ORDER — POTASSIUM CHLORIDE 2 MEQ/ML IV SOLN
80.0000 meq | INTRAVENOUS | Status: DC
  Filled 2017-09-08: qty 40

## 2017-09-08 MED ORDER — TRANEXAMIC ACID 1000 MG/10ML IV SOLN
1.5000 mg/kg/h | INTRAVENOUS | Status: DC
  Filled 2017-09-08: qty 25

## 2017-09-08 MED ORDER — MIDAZOLAM HCL 2 MG/2ML IJ SOLN
2.0000 mg | INTRAMUSCULAR | Status: DC | PRN
  Administered 2017-09-11: 2 mg via INTRAVENOUS
  Administered 2017-09-12 (×3): 1 mg via INTRAVENOUS
  Filled 2017-09-08: qty 2

## 2017-09-08 MED ORDER — DEXMEDETOMIDINE HCL IN NACL 400 MCG/100ML IV SOLN
0.4000 ug/kg/h | INTRAVENOUS | Status: DC
  Administered 2017-09-08: 0.7 ug/kg/h via INTRAVENOUS
  Administered 2017-09-08: 0.5 ug/kg/h via INTRAVENOUS
  Administered 2017-09-09 (×3): 0.7 ug/kg/h via INTRAVENOUS
  Administered 2017-09-09: 0.5 ug/kg/h via INTRAVENOUS
  Administered 2017-09-10: 0.7 ug/kg/h via INTRAVENOUS
  Administered 2017-09-10: 0.6 ug/kg/h via INTRAVENOUS
  Administered 2017-09-10 – 2017-09-12 (×7): 0.7 ug/kg/h via INTRAVENOUS
  Administered 2017-09-12: 1.2 ug/kg/h via INTRAVENOUS
  Administered 2017-09-12: 1 ug/kg/h via INTRAVENOUS
  Administered 2017-09-13: 1.2 ug/kg/h via INTRAVENOUS
  Administered 2017-09-13: 0.7 ug/kg/h via INTRAVENOUS
  Administered 2017-09-13 (×2): 1.2 ug/kg/h via INTRAVENOUS
  Administered 2017-09-13: 0.7 ug/kg/h via INTRAVENOUS
  Administered 2017-09-14: 0.4 ug/kg/h via INTRAVENOUS
  Administered 2017-09-14: 1 ug/kg/h via INTRAVENOUS
  Administered 2017-09-14 (×2): 0.7 ug/kg/h via INTRAVENOUS
  Administered 2017-09-15: 1 ug/kg/h via INTRAVENOUS
  Filled 2017-09-08 (×28): qty 100

## 2017-09-08 MED ORDER — ACETAMINOPHEN 650 MG RE SUPP
650.0000 mg | Freq: Once | RECTAL | Status: AC
  Administered 2017-09-08: 650 mg via RECTAL
  Filled 2017-09-08: qty 1

## 2017-09-08 MED ORDER — PROPOFOL 10 MG/ML IV BOLUS
INTRAVENOUS | Status: AC
  Filled 2017-09-08: qty 20

## 2017-09-08 MED ORDER — VASOPRESSIN 20 UNIT/ML IV SOLN
0.0200 [IU]/min | INTRAVENOUS | Status: DC
  Administered 2017-09-08 – 2017-09-09 (×2): 0.02 [IU]/min via INTRAVENOUS
  Filled 2017-09-08 (×2): qty 2

## 2017-09-08 MED ORDER — LACTATED RINGERS IV SOLN: 500.0000 mL | Freq: Once | INTRAVENOUS | Status: DC | PRN

## 2017-09-08 MED ORDER — SODIUM CHLORIDE 0.9 % IV SOLN
Freq: Once | INTRAVENOUS | Status: AC
  Administered 2017-09-08: 15:00:00 via INTRAVENOUS

## 2017-09-08 MED ORDER — TRANEXAMIC ACID (OHS) PUMP PRIME SOLUTION
2.0000 mg/kg | INTRAVENOUS | Status: DC
  Filled 2017-09-08: qty 1.98

## 2017-09-08 MED ORDER — ATORVASTATIN CALCIUM 80 MG PO TABS
80.0000 mg | ORAL_TABLET | Freq: Every day | ORAL | Status: DC
Start: 2017-09-09 — End: 2017-09-26
  Administered 2017-09-09 – 2017-09-26 (×16): 80 mg
  Filled 2017-09-08 (×17): qty 1

## 2017-09-08 MED ORDER — SODIUM CHLORIDE 0.9 % IV SOLN
2.0000 mg/h | INTRAVENOUS | Status: DC
  Administered 2017-09-08 – 2017-09-10 (×3): 4 mg/h via INTRAVENOUS
  Administered 2017-09-12 (×2): 1 mg/h via INTRAVENOUS
  Filled 2017-09-08 (×5): qty 10

## 2017-09-08 MED ORDER — ROCURONIUM BROMIDE 100 MG/10ML IV SOLN
INTRAVENOUS | Status: DC | PRN
  Administered 2017-09-08 (×2): 100 mg via INTRAVENOUS

## 2017-09-08 MED ORDER — ASPIRIN EC 325 MG PO TBEC: 325.0000 mg | DELAYED_RELEASE_TABLET | Freq: Every day | ORAL | Status: DC

## 2017-09-08 MED ORDER — DEXTROSE 50 % IV SOLN
INTRAVENOUS | Status: AC
  Administered 2017-09-08: 12 mL
  Filled 2017-09-08: qty 50

## 2017-09-08 MED ORDER — SODIUM CHLORIDE 0.9 % IV SOLN
INTRAVENOUS | Status: DC
  Filled 2017-09-08: qty 1

## 2017-09-08 MED ORDER — SODIUM CHLORIDE 0.9 % IV SOLN
INTRAVENOUS | Status: DC
  Filled 2017-09-08: qty 30

## 2017-09-08 MED ORDER — ACETAMINOPHEN 160 MG/5ML PO SOLN: 650.0000 mg | Freq: Once | ORAL | Status: AC

## 2017-09-08 MED ORDER — EPHEDRINE SULFATE 50 MG/ML IJ SOLN
INTRAMUSCULAR | Status: DC | PRN
  Administered 2017-09-08: 5 mg via INTRAVENOUS

## 2017-09-08 MED ORDER — FENTANYL CITRATE (PF) 250 MCG/5ML IJ SOLN
INTRAMUSCULAR | Status: AC
  Filled 2017-09-08: qty 5

## 2017-09-08 SURGICAL SUPPLY — 99 items
ADH SKN CLS APL DERMABOND .7 (GAUZE/BANDAGES/DRESSINGS) ×2
ATTRACTOMAT 16X20 MAGNETIC DRP (DRAPES) ×4 IMPLANT
BAG DECANTER FOR FLEXI CONT (MISCELLANEOUS) ×4 IMPLANT
BLADE SURG 12 STRL SS (BLADE) ×4 IMPLANT
CANISTER SUCT 3000ML PPV (MISCELLANEOUS) ×4 IMPLANT
CANNULA AORTIC HI-FLOW 6.5M20F (CANNULA) ×4 IMPLANT
CANNULA GUNDRY RCSP 15FR (MISCELLANEOUS) ×4 IMPLANT
CANNULA VENOUS MAL SGL STG 40 (MISCELLANEOUS) IMPLANT
CANNULAE VENOUS MAL SGL STG 40 (MISCELLANEOUS)
CATH CPB KIT VANTRIGT (MISCELLANEOUS) ×4 IMPLANT
CATH ROBINSON RED A/P 18FR (CATHETERS) ×12 IMPLANT
CATH THORACIC 28FR (CATHETERS) IMPLANT
CATH THORACIC 28FR RT ANG (CATHETERS) IMPLANT
CATH THORACIC 36FR (CATHETERS) IMPLANT
CATH THORACIC 36FR RT ANG (CATHETERS) ×8 IMPLANT
CLIP VESOCCLUDE MED 24/CT (CLIP) ×2 IMPLANT
CLIP VESOCCLUDE SM WIDE 24/CT (CLIP) IMPLANT
COVER SURGICAL LIGHT HANDLE (MISCELLANEOUS) ×4 IMPLANT
CRADLE DONUT ADULT HEAD (MISCELLANEOUS) ×4 IMPLANT
DERMABOND ADVANCED (GAUZE/BANDAGES/DRESSINGS) ×2
DERMABOND ADVANCED .7 DNX12 (GAUZE/BANDAGES/DRESSINGS) IMPLANT
DRAIN CHANNEL 32F RND 10.7 FF (WOUND CARE) ×4 IMPLANT
DRAPE CARDIOVASCULAR INCISE (DRAPES) ×4
DRAPE INCISE IOBAN 66X45 STRL (DRAPES) ×2 IMPLANT
DRAPE SLUSH/WARMER DISC (DRAPES) IMPLANT
DRAPE SRG 135X102X78XABS (DRAPES) ×2 IMPLANT
DRSG AQUACEL AG ADV 3.5X14 (GAUZE/BANDAGES/DRESSINGS) ×4 IMPLANT
ELECT BLADE 4.0 EZ CLEAN MEGAD (MISCELLANEOUS) ×4
ELECT BLADE 6.5 EXT (BLADE) ×4 IMPLANT
ELECT CAUTERY BLADE 6.4 (BLADE) ×4 IMPLANT
ELECT REM PT RETURN 9FT ADLT (ELECTROSURGICAL) ×8
ELECTRODE BLDE 4.0 EZ CLN MEGD (MISCELLANEOUS) ×2 IMPLANT
ELECTRODE REM PT RTRN 9FT ADLT (ELECTROSURGICAL) ×4 IMPLANT
FELT TEFLON 1X6 (MISCELLANEOUS) ×2 IMPLANT
FEMORAL VENOUS CANN RAP (CANNULA) ×2 IMPLANT
GAUZE SPONGE 4X4 12PLY STRL (GAUZE/BANDAGES/DRESSINGS) ×8 IMPLANT
GAUZE SPONGE 4X4 12PLY STRL LF (GAUZE/BANDAGES/DRESSINGS) ×2 IMPLANT
GLOVE BIO SURGEON STRL SZ7.5 (GLOVE) ×8 IMPLANT
GOWN STRL REUS W/ TWL LRG LVL3 (GOWN DISPOSABLE) ×8 IMPLANT
GOWN STRL REUS W/TWL LRG LVL3 (GOWN DISPOSABLE) ×16
HEMOSTAT POWDER SURGIFOAM 1G (HEMOSTASIS) ×12 IMPLANT
HEMOSTAT SURGICEL 2X14 (HEMOSTASIS) ×4 IMPLANT
INSERT FOGARTY XLG (MISCELLANEOUS) IMPLANT
KIT BASIN OR (CUSTOM PROCEDURE TRAY) ×4 IMPLANT
KIT ROOM TURNOVER OR (KITS) ×4 IMPLANT
KIT SUCTION CATH 14FR (SUCTIONS) ×4 IMPLANT
LEAD PACING MYOCARDI (MISCELLANEOUS) ×4 IMPLANT
NS IRRIG 1000ML POUR BTL (IV SOLUTION) ×20 IMPLANT
PACK OPEN HEART (CUSTOM PROCEDURE TRAY) ×4 IMPLANT
PAD ARMBOARD 7.5X6 YLW CONV (MISCELLANEOUS) ×8 IMPLANT
PENCIL BUTTON HOLSTER BLD 10FT (ELECTRODE) ×4 IMPLANT
SUT BONE WAX W31G (SUTURE) ×4 IMPLANT
SUT ETHIBOND 2 0 SH (SUTURE) ×28
SUT ETHIBOND 2 0 SH 36X2 (SUTURE) IMPLANT
SUT ETHILON 3 0 FSL (SUTURE) ×4 IMPLANT
SUT MNCRL AB 4-0 PS2 18 (SUTURE) IMPLANT
SUT PROLENE 3 0 SH DA (SUTURE) IMPLANT
SUT PROLENE 3 0 SH1 36 (SUTURE) IMPLANT
SUT PROLENE 4 0 RB 1 (SUTURE) ×8
SUT PROLENE 4 0 SH DA (SUTURE) ×8 IMPLANT
SUT PROLENE 4-0 RB1 .5 CRCL 36 (SUTURE) ×2 IMPLANT
SUT PROLENE 5 0 C 1 36 (SUTURE) ×4 IMPLANT
SUT PROLENE 6 0 C 1 30 (SUTURE) ×4 IMPLANT
SUT PROLENE 7 0 DA (SUTURE) IMPLANT
SUT PROLENE 8 0 BV175 6 (SUTURE) ×4 IMPLANT
SUT PROLENE BLUE 7 0 (SUTURE) ×4 IMPLANT
SUT SILK  1 MH (SUTURE) ×8
SUT SILK 1 MH (SUTURE) IMPLANT
SUT SILK 1 TIES 10X30 (SUTURE) ×2 IMPLANT
SUT SILK 2 0 SH CR/8 (SUTURE) ×4 IMPLANT
SUT SILK 2 0 TIES 10X30 (SUTURE) ×2 IMPLANT
SUT SILK 2 0 TIES 17X18 (SUTURE) ×4
SUT SILK 2-0 18XBRD TIE BLK (SUTURE) IMPLANT
SUT SILK 3 0 SH CR/8 (SUTURE) ×2 IMPLANT
SUT SILK 4 0 TIE 10X30 (SUTURE) ×4 IMPLANT
SUT STEEL 6MS V (SUTURE) ×8 IMPLANT
SUT STEEL STERNAL CCS#1 18IN (SUTURE) IMPLANT
SUT STEEL SZ 6 DBL 3X14 BALL (SUTURE) ×4 IMPLANT
SUT TEM PAC WIRE 2 0 SH (SUTURE) ×4 IMPLANT
SUT VIC AB 1 CTX 18 (SUTURE) ×2 IMPLANT
SUT VIC AB 1 CTX 36 (SUTURE) ×8
SUT VIC AB 1 CTX36XBRD ANBCTR (SUTURE) ×4 IMPLANT
SUT VIC AB 2-0 CT1 27 (SUTURE)
SUT VIC AB 2-0 CT1 TAPERPNT 27 (SUTURE) IMPLANT
SUT VIC AB 2-0 CTX 27 (SUTURE) ×4 IMPLANT
SUT VIC AB 2-0 UR6 27 (SUTURE) ×10 IMPLANT
SUT VIC AB 3-0 SH 27 (SUTURE)
SUT VIC AB 3-0 SH 27X BRD (SUTURE) IMPLANT
SUT VIC AB 3-0 X1 27 (SUTURE) ×6 IMPLANT
SUT VICRYL 4-0 PS2 18IN ABS (SUTURE) IMPLANT
SUTURE E-PAK OPEN HEART (SUTURE) ×4 IMPLANT
SWAB COLLECTION DEVICE MRSA (MISCELLANEOUS) ×2 IMPLANT
SWAB CULTURE ESWAB REG 1ML (MISCELLANEOUS) ×2 IMPLANT
SYSTEM SAHARA CHEST DRAIN ATS (WOUND CARE) ×4 IMPLANT
TOWEL OR 17X24 6PK STRL BLUE (TOWEL DISPOSABLE) ×8 IMPLANT
TOWEL OR 17X26 10 PK STRL BLUE (TOWEL DISPOSABLE) ×8 IMPLANT
TRAY FOLEY SILVER 14FR TEMP (SET/KITS/TRAYS/PACK) ×4 IMPLANT
UNDERPAD 30X30 (UNDERPADS AND DIAPERS) ×4 IMPLANT
WATER STERILE IRR 1000ML POUR (IV SOLUTION) ×8 IMPLANT

## 2017-09-08 NOTE — Progress Notes (Signed)
Pt transported back from OR to 2H03 w/ no apparent complications.  Once pt back in room and placed back on previous settings, pt desat to 91%.  RN request increase in fio2.  Pt placed on 70% fio2, sat now 99%. RN aware.

## 2017-09-08 NOTE — Progress Notes (Signed)
Day of Surgery Procedure(s) (LRB): REMOVAL OF R-VENTRICULAR ASSIST DEVICE WITH CARDIOPULMONARY BYPASS (N/A) TRANSESOPHAGEAL ECHOCARDIOGRAM (TEE) (N/A) Subjective: Patient return to the operating room for decannulation of right ventricular assist device which he tolerated well under transesophageal echo guidance RV function shows significant improvement The sternum was closed New PA catheter and central line were placed Patient will be kept sedated on ventilator for vent weaning per CCM tomorrow Low-dose inhaled nitric oxide to help support RV function overnight Objective: Vital signs in last 24 hours: Temp:  [98.1 F (36.7 C)-98.6 F (37 C)] 98.4 F (36.9 C) (10/01 1145) Pulse Rate:  [65-101] 85 (10/01 1145) Cardiac Rhythm: Normal sinus rhythm (10/01 0715) Resp:  [4-27] 20 (10/01 1145) BP: (92-124)/(38-66) 104/57 (10/01 1145) SpO2:  [89 %-100 %] 91 % (10/01 1145) Arterial Line BP: (85-154)/(31-59) 87/35 (10/01 1145) FiO2 (%):  [40 %-70 %] 70 % (10/01 1147) Weight:  [218 lb 0.6 oz (98.9 kg)] 218 lb 0.6 oz (98.9 kg) (10/01 0500)  Hemodynamic parameters for last 24 hours: PAP: (23-32)/(12-27) 23/12 CVP:  [8 mmHg-16 mmHg] 8 mmHg CO:  [4.8 L/min-6.7 L/min] 4.8 L/min CI:  [2.2 L/min/m2-3.3 L/min/m2] 2.2 L/min/m2  Intake/Output from previous day: 09/30 0701 - 10/01 0700 In: 6842.6 [I.V.:2867.6; Blood:1575; NG/GT:1090; IV Piggyback:1310] Out: 5570 [Urine:2980; Emesis/NG output:1060; Chest Tube:1530] Intake/Output this shift: Total I/O In: 2654 [I.V.:500; Blood:1904; IV Piggyback:250] Out: 1150 [Urine:250; Blood:900]       Exam    General- alert and comfortable   Lungs- clear without rales, wheezes   Cor- regular rate and rhythm, no murmur , gallop   Abdomen- soft, non-tender   Extremities - warm, non-tender, minimal edema   Neuro- oriented, appropriate, no focal weakness   Lab Results:  Recent Labs  09/07/17 1653  09/08/17 0500 09/08/17 0819 09/08/17 0958  WBC 17.7*   --  20.5*  --   --   HGB 8.8*  < > 9.1* 7.8* 8.2*  HCT 25.7*  < > 26.6* 23.0* 24.0*  PLT 203  --  230  --   --   < > = values in this interval not displayed. BMET:  Recent Labs  09/07/17 0206  09/08/17 0500 09/08/17 0819 09/08/17 0958  NA 135  < > 134* 136 137  K 3.6  < > 3.9 3.7 3.7  CL 99*  < > 102 98* 98*  CO2 27  --  24  --   --   GLUCOSE 97  < > 75 88 116*  BUN 63*  < > 64* 61* 58*  CREATININE 1.72*  < > 1.67* 1.60* 1.60*  CALCIUM 7.9*  --  7.3*  --   --   < > = values in this interval not displayed.  PT/INR:  Recent Labs  09/08/17 0500  LABPROT 19.0*  INR 1.61   ABG    Component Value Date/Time   PHART 7.414 09/08/2017 0955   HCO3 29.2 (H) 09/08/2017 0955   TCO2 27 09/08/2017 0958   ACIDBASEDEF 2.0 08/31/2017 1211   O2SAT 100.0 09/08/2017 0955   CBG (last 3)   Recent Labs  09/08/17 0502 09/08/17 0601 09/08/17 0655  GLUCAP 70 63* 89    Assessment/Plan: S/P Procedure(s) (LRB): REMOVAL OF R-VENTRICULAR ASSIST DEVICE WITH CARDIOPULMONARY BYPASS (N/A) TRANSESOPHAGEAL ECHOCARDIOGRAM (TEE) (N/A) Acute postop RV failure with temporary RVAD support Diabetes mellitus Acute on chronic renal insufficiency Postoperative blood loss anemia requiring transfusion Postoperative atrial fibrillation requiring cardioversion  LOS: 16 days    Kathlee Nations Trigt III  09/08/2017  

## 2017-09-08 NOTE — Transfer of Care (Signed)
Immediate Anesthesia Transfer of Care Note  Patient: Clinton Harrington  Procedure(s) Performed: REMOVAL OF R-VENTRICULAR ASSIST DEVICE WITH CARDIOPULMONARY BYPASS (N/A Chest) TRANSESOPHAGEAL ECHOCARDIOGRAM (TEE) (N/A )  Patient Location: SICU  Anesthesia Type:General  Level of Consciousness: Patient remains intubated per anesthesia plan  Airway & Oxygen Therapy: Patient remains intubated per anesthesia plan and Patient placed on Ventilator (see vital sign flow sheet for setting)  Post-op Assessment: Report given to RN and Post -op Vital signs reviewed and stable  Post vital signs: Reviewed and stable  Last Vitals:  Vitals:   09/08/17 0716 09/08/17 0730  BP:    Pulse: 96 94  Resp: 18 (!) 4  Temp: 36.7 C   SpO2: 99% 100%    Last Pain:  Vitals:   09/08/17 0716  TempSrc: Core (Comment)  PainSc:       Patients Stated Pain Goal: 0 (08/27/17 1200)  Complications: No apparent anesthesia complications

## 2017-09-08 NOTE — Progress Notes (Signed)
Pre Procedure note for inpatients:   Clinton Harrington has been scheduled for Procedure(s): REMOVAL OF R-VENTRICULAR ASSIST DEVICE WITH CARDIOPULMONARY BYPASS (N/A) TRANSESOPHAGEAL ECHOCARDIOGRAM (TEE) (N/A) today. The various methods of treatment have been discussed with the patient. After consideration of the risks, benefits and treatment options the patient has consented to the planned procedure.   The patient has been seen and labs reviewed. There are no changes in the patient's condition to prevent proceeding with the planned procedure today.  Recent labs:  Lab Results  Component Value Date   WBC 20.5 (H) 09/08/2017   HGB 9.1 (L) 09/08/2017   HCT 26.6 (L) 09/08/2017   PLT 230 09/08/2017   GLUCOSE 75 09/08/2017   CHOL 126 08/24/2017   TRIG 240 (H) 08/24/2017   HDL 31 (L) 08/24/2017   LDLDIRECT 128.6 10/30/2012   LDLCALC 47 08/24/2017   ALT 18 09/08/2017   AST 60 (H) 09/08/2017   NA 134 (L) 09/08/2017   K 3.9 09/08/2017   CL 102 09/08/2017   CREATININE 1.67 (H) 09/08/2017   BUN 64 (H) 09/08/2017   CO2 24 09/08/2017   TSH 0.722 08/25/2017   INR 1.61 09/08/2017   HGBA1C 5.9 (H) 08/25/2017   MICROALBUR 30.9 (H) 10/30/2012    Clinton Bussing, MD 09/08/2017 7:39 AM

## 2017-09-08 NOTE — Progress Notes (Signed)
ANTICOAGULATION CONSULT NOTE - Follow Up Consult  Pharmacy Consult for heparin > now off Indication: IABP/Centrimag > s/p removal  Allergies  Allergen Reactions  . Aspirin Other (See Comments)    Stomach upset  . Latex Rash  . Oxycodone-Acetaminophen Itching    Patient Measurements: Height:  (180.3 cm) Weight: 218 lb 0.6 oz (98.9 kg) IBW/kg (Calculated) : 75.3 Heparin Dosing Weight: 90 kg  Vital Signs: Temp: 98.4 F (36.9 C) (10/01 1200) Temp Source: Core (Comment) (10/01 1200) BP: 104/54 (10/01 1200) Pulse Rate: 89 (10/01 1200)  Labs:  Recent Labs  09/06/17 0500  09/07/17 0206 09/07/17 1105 09/07/17 1653  09/08/17 0500 09/08/17 0819 09/08/17 0958 09/08/17 1144  HGB 8.1*  < > 7.4* 7.8* 8.8*  < > 9.1* 7.8* 8.2* 8.8*  HCT 25.2*  < > 22.2* 23.0* 25.7*  < > 26.6* 23.0* 24.0* 26.0*  PLT 206  < > 186 179 203  --  230  --   --   --   APTT 146*  --  74*  --   --   --  69*  --   --   --   LABPROT 16.5*  --  18.6*  --   --   --  19.0*  --   --   --   INR 1.35  --  1.56  --   --   --  1.61  --   --   --   HEPARINUNFRC 0.75*  --  0.14*  --   --   --  0.15*  --   --   --   CREATININE 1.62*  < > 1.72*  --   --   < > 1.67* 1.60* 1.60*  --   < > = values in this interval not displayed.  Estimated Creatinine Clearance: 56.6 mL/min (A) (by C-G formula based on SCr of 1.6 mg/dL (H)).   Assessment: 59 yoM presented as code STEMI now s/p cath with IABP placed an patient underwent CABG x4 9/20 requiring Centrimag placement for RV support and continued IABP for LV support.   Heparin drip per Centrimag protocol. Pt had bleeding from femoral site on 9/24 and 9/25now resolved after holding heparin for 2 hours, IABP removed 9/24, Centrimag still in place, going for removal today.  Heparin level this morning 0.15.  No overt bleeding or complications noted.  Goal of Therapy:  ACT 160-180 seconds Monitor platelets by anticoagulation protocol: Yes   Plan:  -D/c IV heparin since  devices now removed. -Pharmacy will follow peripherally.  Tad Moore, BCPS  Clinical Pharmacist Pager [phone]  09/08/2017 12:21 PM

## 2017-09-08 NOTE — Progress Notes (Signed)
Cortrak Tube Team Note:  Consult received to place a Cortrak feeding tube.   A 10 F Cortrak tube was placed in the L nare and secured with a nasal bridle at 97 cm. Per the Cortrak monitor reading the tube tip is post pyloric.   X-ray is required, abdominal x-ray has been ordered by the Cortrak team. Please confirm tube placement before using the Cortrak tube.   If the tube becomes dislodged please keep the tube and contact the Cortrak team at [email or web address] ([email or web address]) for replacement.  If after hours and replacement cannot be delayed, place a NG tube and confirm placement with an abdominal x-ray.    Kendell Bane RD, LDN, CNSC [phone] Pager [phone] After Hours Pager

## 2017-09-08 NOTE — Significant Event (Signed)
All IV tubings changed to new lines via new introducer and CVC at the requests of Dr. Donata Clay. New IV tubings changed to PICC.   Used bags of fentanyl and versed from OR wasted due to all medications and fluids will be infusing via new lines per verbal order from Dr. Donata Clay. Wasted approximately 25cc of versed, and wasted approximately 70cc of fentanyl in sink and flushed-wasted these with another RN [employer].     Clinton Harrington

## 2017-09-08 NOTE — Progress Notes (Signed)
Late entry-  Called to OR for Nitric.  Placed on NO 10 ppm per Dr Alla German.  Pt appeared to tol well.  CRNA at bedside and aware.

## 2017-09-08 NOTE — Progress Notes (Signed)
Echocardiogram Echocardiogram Transesophageal has been performed.  Pieter Partridge 09/08/2017, 9:08 AM

## 2017-09-08 NOTE — Progress Notes (Signed)
Patient ID: Clinton Harrington, male   DOB: Jul 08, 1951, 66 y.o.   MRN: [id number]     Advanced Heart Failure Rounding Note   Subjective:    Events CABG x4 IABP Centrimag 9/20    IABP removed 9/24.  Centrimag RVAD in place DC-CV successful 9/29  Centrimag removal 10/1  Remains intubated on 70%. On 10 ppm NO, milrinone 0.25 mcg, norepi 18 mcg, epi 5 mcg, and amio 30 mg per hour.   Swan numbers: CVP 20  PA 37/26 Fick CO/CI 6.8/ 3,2   Echo (09/01/17): EF 65-70% with moderate LV hypertrophy, mildly dilated RV with moderately decreased systolic function, IVC dilated.   Objective:   Weight Range: 218 lb 0.6 oz (98.9 kg) Body mass index is 30.41 kg/m.   Vital Signs:   Temp:  [98.1 F (36.7 C)-98.6 F (37 C)] 98.1 F (36.7 C) (10/01 0716) Pulse Rate:  [65-101] 85 (10/01 1145) Resp:  [4-27] 20 (10/01 1145) BP: (89-124)/(38-66) 104/57 (10/01 1145) SpO2:  [89 %-100 %] 91 % (10/01 1145) Arterial Line BP: (85-154)/(31-59) 87/35 (10/01 1145) FiO2 (%):  [40 %-70 %] 70 % (10/01 1147) Weight:  [218 lb 0.6 oz (98.9 kg)] 218 lb 0.6 oz (98.9 kg) (10/01 0500) Last BM Date: 08/29/17  Weight change: Filed Weights   09/06/17 0500 09/07/17 0500 09/08/17 0500  Weight: 208 lb 1.8 oz (94.4 kg) 210 lb 1.6 oz (95.3 kg) 218 lb 0.6 oz (98.9 kg)    Intake/Output:   Intake/Output Summary (Last 24 hours) at 09/08/17 1159 Last data filed at 09/08/17 1045  Gross per 24 hour  Intake          8052.04 ml  Output             5425 ml  Net          2627.04 ml      Physical Exam   CVP 20  General:  Intubated. Sedated.  HEENT: ETT  Neck: supple. no JVD. Carotids 2+ bilat; no bruits. No lymphadenopathy or thryomegaly appreciated. L subclavian swan  Cor: PMI nondisplaced. Regular rate & rhythm. No rubs, gallops or murmurs. Lungs: Deceased  Abdomen: soft, nontender, nondistended. No hepatosplenomegaly. No bruits or masses. Good bowel sounds. Extremities: no cyanosis, clubbing, rash, edema. R and LLE  SCDs.  RUE PICC  Neuro: intubated  GU: foley     Telemetry   Sinus Rhythm 90 bpm. Personally reviewed.   EKG    10/1 Sinus Rhythm 90 bpm qtc 560 ms.   Labs    CBC  Recent Labs  09/07/17 1653  09/08/17 0500 09/08/17 0819 09/08/17 0958  WBC 17.7*  --  20.5*  --   --   HGB 8.8*  < > 9.1* 7.8* 8.2*  HCT 25.7*  < > 26.6* 23.0* 24.0*  MCV 87.1  --  86.4  --   --   PLT 203  --  230  --   --   < > = values in this interval not displayed. Basic Metabolic Panel  Recent Labs  09/07/17 0206  09/08/17 0500 09/08/17 0819 09/08/17 0958  NA 135  < > 134* 136 137  K 3.6  < > 3.9 3.7 3.7  CL 99*  < > 102 98* 98*  CO2 27  --  24  --   --   GLUCOSE 97  < > 75 88 116*  BUN 63*  < > 64* 61* 58*  CREATININE 1.72*  < > 1.67* 1.60* 1.60*  CALCIUM 7.9*  --  7.3*  --   --   < > = values in this interval not displayed. Liver Function Tests  Recent Labs  09/07/17 0206 09/08/17 0500  AST 41 60*  ALT 15* 18  ALKPHOS 83 52  BILITOT 0.8 0.7  PROT 4.9* 5.1*  ALBUMIN 1.9* 1.8*   No results for input(s): LIPASE, AMYLASE in the last 72 hours. Cardiac Enzymes No results for input(s): CKTOTAL, CKMB, CKMBINDEX, TROPONINI in the last 72 hours.  BNP: BNP (last 3 results)  Recent Labs  08/23/17 1927  BNP 200.0*    ProBNP (last 3 results) No results for input(s): PROBNP in the last 8760 hours.   D-Dimer No results for input(s): DDIMER in the last 72 hours. Hemoglobin A1C No results for input(s): HGBA1C in the last 72 hours. Fasting Lipid Panel No results for input(s): CHOL, HDL, LDLCALC, TRIG, CHOLHDL, LDLDIRECT in the last 72 hours. Thyroid Function Tests No results for input(s): TSH, T4TOTAL, T3FREE, THYROIDAB in the last 72 hours.  Invalid input(s): FREET3  Other results:   Imaging    Dg Chest Port 1 View  Result Date: 09/08/2017 CLINICAL DATA:  Respiratory failure. EXAM: PORTABLE CHEST 1 VIEW COMPARISON:  Radiograph of September 07, 2017. FINDINGS: Stable  cardiomegaly. Endotracheal and nasogastric tubes are unchanged in position. Right-sided PICC line is unchanged. Right internal jugular Swan-Ganz catheter is noted with distal tip in expected position of main pulmonary artery. Bilateral chest tubes are noted. No pneumothorax is noted on the right. Apical pneumothorax on the prior exam is significantly improved, with only mild pneumothorax seen laterally in the left lung. Right upper lobe infiltrate is noted. Increased left basilar atelectasis and effusion is noted. Bony thorax is unremarkable. Aortic atherosclerosis. IMPRESSION: Stable support apparatus. Left pneumothorax noted on prior exam is significantly improved. Right upper lobe atelectasis or infiltrate is noted. Increase left basilar opacity is noted concerning for worsening atelectasis and effusion. Electronically Signed   By: Lupita Raider, M.D.   On: 09/08/2017 07:37     Medications:     Scheduled Medications: . acetaminophen (TYLENOL) oral liquid 160 mg/5 mL  650 mg Per Tube Once   Or  . acetaminophen  650 mg Rectal Once  . [START ON 09/09/2017] aspirin EC  325 mg Oral Daily   Or  . [START ON 09/09/2017] aspirin  324 mg Per Tube Daily  . [START ON 09/09/2017] atorvastatin  80 mg Per Tube q1800  . [START ON 09/09/2017] bisacodyl  10 mg Oral Daily   Or  . [START ON 09/09/2017] bisacodyl  10 mg Rectal Daily  . [START ON 09/09/2017] brimonidine  1 drop Right Eye BID  . chlorhexidine  15 mL Mouth/Throat NOW  . [START ON 09/09/2017] docusate sodium  200 mg Oral Daily  . fentaNYL (SUBLIMAZE) injection  50 mcg Intravenous Once  . [START ON 09/09/2017] hydrocortisone sod succinate (SOLU-CORTEF) inj  50 mg Intravenous Daily  . insulin regular  0-10 Units Intravenous TID WC  . metoprolol tartrate  12.5 mg Oral BID   Or  . metoprolol tartrate  12.5 mg Per Tube BID  . [START ON 09/10/2017] pantoprazole  40 mg Oral Daily  . [START ON 09/09/2017] sodium chloride flush  3 mL Intravenous Q12H     Infusions: . sodium chloride    . [START ON 09/09/2017] sodium chloride    . sodium chloride    . amiodarone 30 mg/hr (09/08/17 0735)  . dexmedetomidine (PRECEDEX) IV infusion    .  EPINEPHrine 4 mg in dextrose 5% 250 mL infusion (16 mcg/mL) 5 mcg/min (09/08/17 0918)  . famotidine (PEPCID) IV    . feeding supplement (VITAL 1.5 CAL)    . fentaNYL infusion INTRAVENOUS    . insulin (NOVOLIN-R) infusion    . lactated ringers    . lactated ringers    . lactated ringers    . magnesium sulfate    . milrinone 0.2 mcg/kg/min (09/08/17 0735)  . norepinephrine (LEVOPHED) Adult infusion 18 mcg/min (09/08/17 1009)  . piperacillin-tazobactam (ZOSYN)  IV 3.375 g (09/08/17 0600)  . potassium chloride    . vancomycin Stopped (09/07/17 1904)    PRN Medications: sodium chloride, fentaNYL, lactated ringers, metoprolol tartrate, midazolam, midazolam, ondansetron (ZOFRAN) IV, oxyCODONE, [START ON 09/09/2017] sodium chloride flush, traMADol    Patient Profile   Mr Casebolt is a 66 year old with a history of DMII, smoker, CAD S/P PCI RCA 20 years ago, HTN, PAD, and hyperlipidemia admitted with CP and inferior MI with RV infarction.  He underwent LHC with IABP. After optimized, underwent CABG x4 centrimag for RV infarct on 9/20 and continued IABP.   Assessment/Plan   1. Post-cardiotomy cardiogenic shock: Primarily RV failure from inferior infarct with RV involvement.  Repeat echo on 9/24 showed vigorous LV systolic function with moderate RV dysfunction.  Now s/p CABG and Centrimag for RV support on 9/20. IABP removed 9/24 with good LV function.   Centrimag out this morning. CVP up to 20. Diuresis per Dr Maren Beach  Currently on epi 5 mcg, milrinone 0.25 mcg, norepi 18 mcg, and NO 10 ppm.  2. CAD: Inferior MI with RV involvement at presentation, now s/p CABG.  He is getting ASA.   - Continue statin. No change.  3. Atrial fibrillation/flutter: DCCV 9/29, now remains in NSR.  - Maintaining NSR. Continue  amio 30 mg per hour. .  4. AKI: Creatinine unchanged 1.6. Follow BMET daily. 5. Diabetes: Difficult to control, appreciate help from diabetes coordinator. No change.  6. Hyponatremia: Stable. Sodium 137.   7. ID: MRSA on 9/28 blood culture, possible HCAP.  -Covering for HCAP with vancomycin/Zosyn.  8. Anemia: Received 2 uPRBCs 09/04/17. 4 units PRBCs on 9/29. Received 2UPRBCs intra operatively today.  9. Coffee grounds in NGT - Continue IV protonix  Amy Clegg, NP  09/08/2017, 11:59 AM  Advanced Heart Failure Team Pager [phone] (M-F; 7a - 4p)  Please contact [facility] for night-coverage after hours (4p -7a ) and weekends on [email or web address]  Patient seen with NP, agree with the above note.  Centrimag removed today, CVP up to 20.  He remains on the same pressor/inotrope support as prior to Centrimag removal with good cardiac output.  He is on NO at 10 ppm.  - Will diurese with Lasix 40 mg IV bid, need to gently lower CVP to around 10 range.  - Wean pressors BP allows.   He remains in NSR on amiodarone, continue.   Marca Ancona 09/08/2017 1:27 PM

## 2017-09-08 NOTE — Progress Notes (Signed)
PULMONARY / CRITICAL CARE MEDICINE   Name: Clinton Harrington MRN: [id number] DOB: 12/25/1950    ADMISSION DATE:  08/23/2017 CONSULTATION DATE:  08/30/17  REFERRING MD: Dr Cornelius Moras  CHIEF COMPLAINT:  Acute resp failure, hypoxia, hypercapnia  brief:  Patient is a 66 y/o Caucasian M with hx CADs/p BMS to RCA in 1999 (due to an inferior STEMI), significant PVD, HTN, Type 2 DM (non-insulin dependent), HLP, Tobacco use comes in today complaining of substernal chest pain on admission.He continued to have active CP and the patient was taken to cath lab for further intervention. FOund with RV infacrct and required emergent bypass surgery. Remained in cardiogenic shock requiring balloon pump and placement of RVAD device. Remained on vent with acute resp failure, skin closed but sternum is not closed.He has developed worsening acidosis and hypoxia with bilateral int infiltrates. He has been pos 4 liters last 2 4 hours. He remains on levophed and milrinone. Asked to assist in critical care.  CULTURES: 9/22 sputum>>>ng 9/24>> Sputum>>ng 9/24 Blood>>NEG  9/24 Urine>>ng 9/28 BC x 2 >GPC , ID panel +MRSA >  ANTIBIOTICS: zinacef >>>9/22- 9/24 ceftaz 9/22>>>9/28 Vanc>>>9/24>> Zosyn 9/28 >>  SIGNIFICANT EVENTS: 08/23/2017 - admit 9/22 - intubated Cath cabg RV assist device 9/23 - On epi/levo/RVAD/vent.  CI 1.8 despite all interventions 9/24>> Epi, Levo, milrinine,amio,insulin gtt, Lasix gtt, Heparin, Fent, Versed 9/26 - Remains sedated on fent/ versed drips RVAD flows down to 3200 Good UO on lasix gtt @ 15 9/27 - 40% fio2, peep 8. RPM on RVAD turned down. Stil on levophed, milrinone, epi ggtt and heparin.admio gtt. Making urine. Has chest draines. Good urine outoput. Heparin on hold briefly before resumptions due to bleed in groin site. Getting 2 UI PRBC 9/30 mucous plugging requiring multiple lavages 10/1 RV assist device to be removed today in the OR  SUBJECTIVE/OVERNIGHT/INTERVAL HX RV assist  device to be removed today due to bleeding  VITAL SIGNS: BP (!) 117/50   Pulse 94   Temp 98.1 F (36.7 C) (Core (Comment))   Resp (!) 4   Ht  (1.803 m)   Wt 98.9 kg (218 lb 0.6 oz)   SpO2 100%   BMI 30.41 kg/m   HEMODYNAMICS: PAP: (23-32)/(15-27) 25/20 CVP:  [10 mmHg-16 mmHg] 12 mmHg CO:  [4.8 L/min-6.7 L/min] 4.8 L/min CI:  [2.2 L/min/m2-3.3 L/min/m2] 2.2 L/min/m2  VENTILATOR SETTINGS: Vent Mode: PRVC FiO2 (%):  [40 %-50 %] 50 % Set Rate:  [20 bmp-24 bmp] 20 bmp Vt Set:  [560 mL-600 mL] 560 mL PEEP:  [5 cmH20-6 cmH20] 5 cmH20 Plateau Pressure:  [16 cmH20-26 cmH20] 21 cmH20  INTAKE / OUTPUT: I/O last 3 completed shifts: In: 9877.5 [I.V.:4187.5; Blood:2325; NG/GT:1930; IV Piggyback:1435] Out: 9065 [Urine:3945; Emesis/NG output:2110; Chest Tube:3010]  PHYSICAL EXAMINATION: General:  Critically ill male on vent  HEENT: Odem/AT, PERRL, EOM-I and MMM Neuro: Sedate, on vent post op CV: RRR, RVAD , sternal dressing  PULM: Crackles in all lung fields GI: Soft, NT, ND and +BS Extremities: warm/dry , no edema  Skin: Mid sternal dressing   PULMONARY  Recent Labs Lab 09/06/17 0408  09/07/17 0403 09/07/17 0603  09/08/17 0003 09/08/17 0411 09/08/17 0510 09/08/17 0819 09/08/17 0955 09/08/17 0958  PHART 7.465*  --  7.501*  --   --  7.331* 7.416  --   --  7.414  --   PCO2ART 48.3*  --  37.9  --   --  51.7* 39.8  --   --  45.6  --  PO2ART 75.0*  --  94.0  --   --  58.0* 78.0*  --   --  329.0*  --   HCO3 34.5*  --  29.5*  --   --  27.3 25.7  --   --  29.2*  --   TCO2 36*  < > 31  --   < > 29 27  --  O2SAT 95.0  < > 98.0 89.5  --  87.0 96.0 75.3  --  100.0  --   < > = values in this interval not displayed.  CBC  Recent Labs Lab 09/07/17 1105 09/07/17 1653  09/08/17 0500 09/08/17 0819 09/08/17 0958  HGB 7.8* 8.8*  < > 9.1* 7.8* 8.2*  HCT 23.0* 25.7*  < > 26.6* 23.0* 24.0*  WBC 18.8* 17.7*  --  20.5*  --   --   PLT 179 203  --  230  --   --   < >  = values in this interval not displayed.  COAGULATION  Recent Labs Lab 09/04/17 0324 09/05/17 0346 09/06/17 0500 09/07/17 0206 09/08/17 0500  INR 1.15 1.16 1.35 1.56 1.61    CARDIAC  No results for input(s): TROPONINI in the last 168 hours. No results for input(s): PROBNP in the last 168 hours.   CHEMISTRY  Recent Labs Lab 09/03/17 0800  09/04/17 0324  09/05/17 0346  09/06/17 0500  09/07/17 0206 09/07/17 1702 09/08/17 0500 09/08/17 0819 09/08/17 0958  NA  --   < > 133*  < > 137  < > 136  < > 135 134* 134* 136 137  K  --   < > 4.2  < > 3.6  < > 3.8  < > 3.6 5.0 3.9 3.7 3.7  CL  --   < > 92*  < > 93*  < > 94*  < > 99* 98* 102 98* 98*  CO2  --   --  32  --  33*  --  31  --  27  --  24  --   --   GLUCOSE  --   < > 166*  < > 90  < > 91  < > 97 156* 75 88 116*  BUN  --   < > 47*  < > 55*  < > 54*  < > 63* 68* 64* 61* 58*  CREATININE  --   < > 1.46*  < > 1.37*  < > 1.62*  < > 1.72* 1.80* 1.67* 1.60* 1.60*  CALCIUM  --   --  8.2*  --  8.6*  --  8.1*  --  7.9*  --  7.3*  --   --   MG 1.7  --   --   --  1.7  --   --   --   --   --   --   --   --   < > = values in this interval not displayed. Estimated Creatinine Clearance: 56.6 mL/min (A) (by C-G formula based on SCr of 1.6 mg/dL (H)).   LIVER  Recent Labs Lab 09/03/17 0255 09/04/17 0324 09/05/17 0346 09/06/17 0500 09/07/17 0206 09/08/17 0500  AST 45* 41  --  45* 41 60*  ALT 19 20  --  18 15* 18  ALKPHOS 97 82  --  59 83 52  BILITOT 0.9 0.7  --  0.6 0.8 0.7  PROT 5.6* 5.3*  --  5.8* 4.9* 5.1*  ALBUMIN  2.1* 1.9*  --  2.2* 1.9* 1.8*  INR 1.11 1.15 1.16 1.35 1.56 1.61     INFECTIOUS No results for input(s): LATICACIDVEN, PROCALCITON in the last 168 hours.   ENDOCRINE CBG (last 3)   Recent Labs  09/08/17 0502 09/08/17 0601 09/08/17 0655  GLUCAP 70 63* 89         IMAGING x48h  - image(s) personally visualized  -   highlighted in bold Dg Chest Port 1 View  Result Date: 09/08/2017 CLINICAL DATA:   Respiratory failure. EXAM: PORTABLE CHEST 1 VIEW COMPARISON:  Radiograph of September 07, 2017. FINDINGS: Stable cardiomegaly. Endotracheal and nasogastric tubes are unchanged in position. Right-sided PICC line is unchanged. Right internal jugular Swan-Ganz catheter is noted with distal tip in expected position of main pulmonary artery. Bilateral chest tubes are noted. No pneumothorax is noted on the right. Apical pneumothorax on the prior exam is significantly improved, with only mild pneumothorax seen laterally in the left lung. Right upper lobe infiltrate is noted. Increased left basilar atelectasis and effusion is noted. Bony thorax is unremarkable. Aortic atherosclerosis. IMPRESSION: Stable support apparatus. Left pneumothorax noted on prior exam is significantly improved. Right upper lobe atelectasis or infiltrate is noted. Increase left basilar opacity is noted concerning for worsening atelectasis and effusion. Electronically Signed   By: Lupita Raider, M.D.   On: 09/08/2017 07:37   Dg Chest Port 1 View  Result Date: 09/07/2017 CLINICAL DATA:  Status post CABG. EXAM: PORTABLE CHEST 1 VIEW COMPARISON:  09/06/2017 and prior radiographs FINDINGS: A new left apical/medial pneumothorax is identified (25-30%). Cardiomegaly and CABG changes again noted. Endotracheal tube with tip 2.5 cm above the carina, Swan-Ganz catheter with tip overlying the main pulmonary artery, right PICC line with tip overlying the superior cavoatrial junction, NG tube entering the stomach, mediastinal tube and bilateral thoracostomy tubes are present. Defibrillator pads overlying the left chest are now noted. Increasing pulmonary vascular congestion/ possible mild interstitial edema noted. IMPRESSION: New left apical/medial pneumothorax - 25-30%. Increasing pulmonary vascular congestion/possible mild interstitial edema. No other significant change. Critical Value/emergent results were called by telephone at the time of interpretation  on 09/07/2017 at 8:48 am to [facility], RN, who verbally acknowledged these results. Electronically Signed   By: Harmon Pier M.D.   On: 09/07/2017 08:56   ASSESSMENT / PLAN:  PULMONARY A: 09/08/2017 -> acute resp failure due to cardiogenic shock  P:   Continue full vent support given hemodynamics CXR and ABG daily Titrate O2 for sat of 88-92%  CARDIOVASCULAR A:  09/08/2017 -> cardiogenic shock  P:  Per cards and cvts RVAD to be removed today Pressors noted  RENAL  Intake/Output Summary (Last 24 hours) at 09/08/17 1032 Last data filed at 09/08/17 1020  Gross per 24 hour  Intake          7707.84 ml  Output             4820 ml  Net          2887.84 ml   Recent Labs Lab 09/07/17 0206 09/07/17 1702 09/08/17 0500 09/08/17 0819 09/08/17 0958  CREATININE 1.72* 1.80* 1.67* 1.60* 1.60*  A:   Acute renal failure, UOP remains as above  P:   Monitor renal function Trend UOP Replace electrolytes as needed  BMET in AM  GASTROINTESTINAL A:   Malnutrition , tolerating TF  GIB -drk blood in OGT  P:   TF PPI q12  ?d/c anti-coagulation now that RVAD will be out.  HEMATOLOGIC  Recent Labs Lab 09/07/17 1105 09/07/17 1653  09/08/17 0500 09/08/17 0819 09/08/17 0958  HGB 7.8* 8.8*  < > 9.1* 7.8* 8.2*  HCT 23.0* 25.7*  < > 26.6* 23.0* 24.0*  WBC 18.8* 17.7*  --  20.5*  --   --   PLT 179 203  --  230  --   --   < > = values in this interval not displayed. A:   RBC: anemia criticall illness>hbg drop /GIB s/p 4 u PRBC 9/29 -9/30 Platelet *normal WBC high  P:  - Trend CBC - PRBC for hgb </= 8-8.5gm%   - Transfuse per ICU protocol - ?D/C anti-coag, will defer to CVTS   INFECTIOUS  A:   Possible PNA  ?MRSA Bactermia  P:   - Cont on Vanc /Zosyn  - Follow cx data   ENDOCRINE A:   Hyperglycemia improved after reducing stress dose steroids P:   - ICU hyperglycemia protocol - Continue hydrocortisone 50 mg daily   NEUROLOGIC A:   #Current: follows commands on  wua per nursing  P:   RASS goal: 0 to -3 Fent and versed gtt to continue  FAMILY  - Updates: No family bedside 10/1.  - Inter-disciplinary family meet or Palliative Care meeting due by:  Ongoing.  The patient is critically ill with multiple organ systems failure and requires high complexity decision making for assessment and support, frequent evaluation and titration of therapies, application of advanced monitoring technologies and extensive interpretation of multiple databases.   Critical Care Time devoted to patient care services described in this note is  35  Minutes. This time reflects time of care of this signee Dr Koren Bound. This critical care time does not reflect procedure time, or teaching time or supervisory time of PA/NP/Med student/Med Resident etc but could involve care discussion time.  Alyson Reedy, M.D. [facility] Pulmonary/Critical Care Medicine. Pager: [phone]. After hours pager: [phone].

## 2017-09-08 NOTE — Brief Op Note (Addendum)
08/23/2017 - 09/08/2017  10:16 AM  PATIENT:  Clinton Harrington  66 y.o. male  PRE-OPERATIVE DIAGNOSIS:  HEART FAILURE  POST-OPERATIVE DIAGNOSIS:  HEART FAILURE  PROCEDURE:  Procedure(s): REMOVAL OF R-VENTRICULAR ASSIST DEVICE WITH CARDIOPULMONARY BYPASS (N/A) TRANSESOPHAGEAL ECHOCARDIOGRAM (TEE) (N/A) Placement of PA Swan catheter Placement of central line  SURGEON:  Surgeon(s) and Role:    Kerin Perna, MD - Primary  PHYSICIAN ASSISTANT:  Jari Favre, PA-C   ANESTHESIA:   general  EBL:  250 cc  BLOOD ADMINISTERED:2 packed cells, 2 FFP  DRAINS: one right angle chest tube in the right pleura, one angled chest tube in the left pleura, one blake drain in the anterior mediastinum.    LOCAL MEDICATIONS USED:  NONE  SPECIMEN:  No Specimen  DISPOSITION OF SPECIMEN:  N/A  COUNTS:  YES  TOURNIQUET:  * No tourniquets in log *  DICTATION: .Dragon Dictation  PLAN OF CARE: Admit to inpatient   PATIENT DISPOSITION:  ICU - intubated and hemodynamically stable.   Delay start of Pharmacological VTE agent (>24hrs) due to surgical blood loss or risk of bleeding: yes

## 2017-09-08 NOTE — Anesthesia Preprocedure Evaluation (Addendum)
Anesthesia Evaluation  Patient identified by MRN, date of birth, ID band Patient awake    Reviewed: Allergy & Precautions, NPO status , Patient's Chart, lab work & pertinent test results  Airway Mallampati: Intubated       Dental   Pulmonary Current Smoker,    Pulmonary exam normal        Cardiovascular hypertension, Pt. on medications + CAD and + Past MI  Normal cardiovascular exam  Centrimag RV AD placed 9/20 for RV failure   Neuro/Psych    GI/Hepatic   Endo/Other  diabetes, Type 2, Insulin Dependent  Renal/GU      Musculoskeletal   Abdominal   Peds  Hematology   Anesthesia Other Findings   Reproductive/Obstetrics                           Anesthesia Physical Anesthesia Plan  ASA: III  Anesthesia Plan: General   Post-op Pain Management:    Induction: Intravenous  PONV Risk Score and Plan: 1 and Ondansetron and Dexamethasone  Airway Management Planned: Oral ETT  Additional Equipment:   Intra-op Plan:   Post-operative Plan: Post-operative intubation/ventilation  Informed Consent: I have reviewed the patients History and Physical, chart, labs and discussed the procedure including the risks, benefits and alternatives for the proposed anesthesia with the patient or authorized representative who has indicated his/her understanding and acceptance.     Plan Discussed with: CRNA and Surgeon  Anesthesia Plan Comments:         Anesthesia Quick Evaluation

## 2017-09-08 NOTE — Anesthesia Postprocedure Evaluation (Signed)
Anesthesia Post Note  Patient: Clinton Harrington  Procedure(s) Performed: REMOVAL OF R-VENTRICULAR ASSIST DEVICE WITH CARDIOPULMONARY BYPASS (N/A Chest) TRANSESOPHAGEAL ECHOCARDIOGRAM (TEE) (N/A )     Patient location during evaluation: SICU Anesthesia Type: General Level of consciousness: sedated Pain management: pain level controlled Vital Signs Assessment: post-procedure vital signs reviewed and stable Respiratory status: patient remains intubated per anesthesia plan Cardiovascular status: stable Postop Assessment: no apparent nausea or vomiting Anesthetic complications: no    Last Vitals:  Vitals:   09/08/17 1532 09/08/17 1545  BP:    Pulse: 83 83  Resp: (!) 21 (!) 22  Temp: 37.3 C 37.3 C  SpO2: 99% 99%    Last Pain:  Vitals:   09/08/17 1520  TempSrc: Core (Comment)  PainSc:                  Clinton Harrington [name]

## 2017-09-08 NOTE — Progress Notes (Signed)
CT surgery p.m. Rounds  Patient remained stable after decannulation of RVAD centrimag earlier today.  Sinus rhythm, cardiac output 2.2, adequate urine output and minimal chest tube drainage 6 hour postop labs are reviewed  Plan will be to slowly wean inhaled nitric oxide and attempt extubation with guidance from critical care medicine tomorrow

## 2017-09-08 NOTE — Progress Notes (Signed)
Hypoglycemic Event  CBG: 60  Treatment: 25 ml D50 Symptoms: none  Follow-up CBG: Time:2045 CBG Result:100      Wynonia Musty

## 2017-09-08 NOTE — Progress Notes (Signed)
Transported pt from 2H03 to OR on ventilator with no complications. CRNA requesting to keep vent in OR in case of need. RT will continue to monitor.

## 2017-09-09 ENCOUNTER — Encounter (HOSPITAL_COMMUNITY): Payer: Self-pay | Admitting: Cardiothoracic Surgery

## 2017-09-09 ENCOUNTER — Inpatient Hospital Stay (HOSPITAL_COMMUNITY): Payer: PPO

## 2017-09-09 DIAGNOSIS — I509 Heart failure, unspecified: Secondary | ICD-10-CM

## 2017-09-09 LAB — CBC
HCT: 26.2 % — ABNORMAL LOW (ref 39.0–52.0)
HCT: 25.4 % — ABNORMAL LOW (ref 39.0–52.0)
HEMOGLOBIN: 8.7 g/dL — AB (ref 13.0–17.0)
Hemoglobin: 8.3 g/dL — ABNORMAL LOW (ref 13.0–17.0)
MCH: 28.7 pg (ref 26.0–34.0)
MCH: 28.7 pg (ref 26.0–34.0)
MCHC: 33.2 g/dL (ref 30.0–36.0)
MCHC: 32.7 g/dL (ref 30.0–36.0)
MCV: 86.5 fL (ref 78.0–100.0)
MCV: 87.9 fL (ref 78.0–100.0)
PLATELETS: 259 10*3/uL (ref 150–400)
Platelets: 217 10*3/uL (ref 150–400)
RBC: 3.03 MIL/uL — AB (ref 4.22–5.81)
RBC: 2.89 MIL/uL — AB (ref 4.22–5.81)
RDW: 15.2 % (ref 11.5–15.5)
RDW: 15.4 % (ref 11.5–15.5)
WBC: 17.1 10*3/uL — AB (ref 4.0–10.5)
WBC: 15.3 10*3/uL — ABNORMAL HIGH (ref 4.0–10.5)

## 2017-09-09 LAB — GLUCOSE, CAPILLARY
GLUCOSE-CAPILLARY: 97 mg/dL (ref 65–99)
GLUCOSE-CAPILLARY: 171 mg/dL — AB (ref 65–99)
GLUCOSE-CAPILLARY: 291 mg/dL — AB (ref 65–99)
Glucose-Capillary: 60 mg/dL — ABNORMAL LOW (ref 65–99)
Glucose-Capillary: 71 mg/dL (ref 65–99)
Glucose-Capillary: 109 mg/dL — ABNORMAL HIGH (ref 65–99)
Glucose-Capillary: 348 mg/dL — ABNORMAL HIGH (ref 65–99)

## 2017-09-09 LAB — BASIC METABOLIC PANEL
ANION GAP: 9 (ref 5–15)
BUN: 63 mg/dL — ABNORMAL HIGH (ref 6–20)
CHLORIDE: 99 mmol/L — AB (ref 101–111)
CO2: 25 mmol/L (ref 22–32)
Calcium: 7.3 mg/dL — ABNORMAL LOW (ref 8.9–10.3)
Creatinine, Ser: 1.63 mg/dL — ABNORMAL HIGH (ref 0.61–1.24)
GFR calc non Af Amer: 43 mL/min — ABNORMAL LOW (ref >60)
GFR, EST AFRICAN AMERICAN: 50 mL/min — AB (ref >60)
Glucose, Bld: 66 mg/dL (ref 65–99)
POTASSIUM: 4.6 mmol/L (ref 3.5–5.1)
SODIUM: 133 mmol/L — AB (ref 135–145)

## 2017-09-09 LAB — POCT I-STAT, CHEM 8
BUN: 70 mg/dL — ABNORMAL HIGH (ref 6–20)
CHLORIDE: 99 mmol/L — AB (ref 101–111)
Calcium, Ion: 1.03 mmol/L — ABNORMAL LOW (ref 1.15–1.40)
Creatinine, Ser: 1.9 mg/dL — ABNORMAL HIGH (ref 0.61–1.24)
GLUCOSE: 304 mg/dL — AB (ref 65–99)
HCT: 24 % — ABNORMAL LOW (ref 39.0–52.0)
Hemoglobin: 8.2 g/dL — ABNORMAL LOW (ref 13.0–17.0)
POTASSIUM: 5.7 mmol/L — AB (ref 3.5–5.1)
Sodium: 131 mmol/L — ABNORMAL LOW (ref 135–145)
TCO2: 23 mmol/L (ref 22–32)

## 2017-09-09 LAB — PREPARE FRESH FROZEN PLASMA
Unit division: 0
Unit division: 0

## 2017-09-09 LAB — PREPARE CRYOPRECIPITATE
Unit division: 0
Unit division: 0
Unit division: 0

## 2017-09-09 LAB — BLOOD GAS, ARTERIAL
Acid-base deficit: 0.3 mmol/L (ref 0.0–2.0)
Bicarbonate: 23.9 mmol/L (ref 20.0–28.0)
Drawn by: 270271
FIO2: 60
MECHVT: 560 mL
O2 Saturation: 98.2 %
PEEP: 5 cmH2O
Patient temperature: 98.6
RATE: 20 resp/min
pCO2 arterial: 39.1 mmHg (ref 32.0–48.0)
pH, Arterial: 7.403 (ref 7.350–7.450)
pO2, Arterial: 108 mmHg (ref 83.0–108.0)

## 2017-09-09 LAB — BPAM CRYOPRECIPITATE
Blood Product Expiration Date: 201810011428
Blood Product Expiration Date: 201810011428
Blood Product Expiration Date: 201810011525
ISSUE DATE / TIME: 201810010912
ISSUE DATE / TIME: 201810010948
Unit Type and Rh: 5100
Unit Type and Rh: 5100
Unit Type and Rh: 5100

## 2017-09-09 LAB — COOXEMETRY PANEL
Carboxyhemoglobin: 1.4 % (ref 0.5–1.5)
Methemoglobin: 0.8 % (ref 0.0–1.5)
O2 Saturation: 72.5 %
Total hemoglobin: 9.2 g/dL — ABNORMAL LOW (ref 12.0–16.0)

## 2017-09-09 LAB — BPAM FFP
Blood Product Expiration Date: 201810032359
Blood Product Expiration Date: 201810032359
ISSUE DATE / TIME: 201810010719
ISSUE DATE / TIME: 201810010719
Unit Type and Rh: 6200
Unit Type and Rh: 6200

## 2017-09-09 LAB — CREATININE, SERUM
CREATININE: 1.8 mg/dL — AB (ref 0.61–1.24)
GFR calc Af Amer: 44 mL/min — ABNORMAL LOW (ref >60)
GFR calc non Af Amer: 38 mL/min — ABNORMAL LOW (ref >60)

## 2017-09-09 LAB — MAGNESIUM
MAGNESIUM: 3 mg/dL — AB (ref 1.7–2.4)
Magnesium: 3 mg/dL — ABNORMAL HIGH (ref 1.7–2.4)

## 2017-09-09 LAB — PREALBUMIN: Prealbumin: 7.3 mg/dL — ABNORMAL LOW (ref 18–38)

## 2017-09-09 LAB — CALCIUM, IONIZED: CALCIUM, IONIZED, SERUM: 3.9 mg/dL — AB (ref 4.5–5.6)

## 2017-09-09 MED ORDER — CHLORHEXIDINE GLUCONATE 0.12% ORAL RINSE (MEDLINE KIT)
15.0000 mL | Freq: Two times a day (BID) | OROMUCOSAL | Status: DC
  Administered 2017-09-09 – 2017-09-26 (×35): 15 mL via OROMUCOSAL

## 2017-09-09 MED ORDER — ACETAMINOPHEN 160 MG/5ML PO SOLN
650.0000 mg | Freq: Four times a day (QID) | ORAL | Status: DC | PRN
  Administered 2017-09-09 – 2017-09-25 (×18): 650 mg via ORAL
  Filled 2017-09-09 (×17): qty 20.3

## 2017-09-09 MED ORDER — VITAL 1.5 CAL PO LIQD
1000.0000 mL | ORAL | Status: DC
  Administered 2017-09-09: 1000 mL
  Filled 2017-09-09 (×3): qty 1000

## 2017-09-09 MED ORDER — FUROSEMIDE 10 MG/ML IJ SOLN
6.0000 mg/h | INTRAVENOUS | Status: DC
  Administered 2017-09-09: 8 mg/h via INTRAVENOUS
  Administered 2017-09-10 – 2017-09-11 (×3): 12 mg/h via INTRAVENOUS
  Administered 2017-09-12: 6 mg/h via INTRAVENOUS
  Filled 2017-09-09 (×8): qty 25

## 2017-09-09 MED ORDER — ENOXAPARIN SODIUM 30 MG/0.3ML ~~LOC~~ SOLN
30.0000 mg | SUBCUTANEOUS | Status: DC
  Administered 2017-09-09: 30 mg via SUBCUTANEOUS
  Filled 2017-09-09: qty 0.3

## 2017-09-09 MED ORDER — SODIUM CHLORIDE 0.9% FLUSH
10.0000 mL | INTRAVENOUS | Status: DC | PRN
  Administered 2017-09-09: 40 mL
  Administered 2017-09-10: 10 mL
  Filled 2017-09-09 (×2): qty 40

## 2017-09-09 MED ORDER — ORAL CARE MOUTH RINSE
15.0000 mL | OROMUCOSAL | Status: DC
  Administered 2017-09-09 – 2017-09-23 (×134): 15 mL via OROMUCOSAL

## 2017-09-09 MED ORDER — METOCLOPRAMIDE HCL 5 MG/ML IJ SOLN
10.0000 mg | Freq: Four times a day (QID) | INTRAMUSCULAR | Status: DC
  Administered 2017-09-09 – 2017-09-22 (×45): 10 mg via INTRAVENOUS
  Filled 2017-09-09 (×44): qty 2

## 2017-09-09 MED ORDER — CHLORHEXIDINE GLUCONATE CLOTH 2 % EX PADS
6.0000 | MEDICATED_PAD | Freq: Every day | CUTANEOUS | Status: DC
  Administered 2017-09-10 – 2017-09-12 (×3): 6 via TOPICAL

## 2017-09-09 MED ORDER — INSULIN GLARGINE 100 UNIT/ML ~~LOC~~ SOLN
22.0000 [IU] | Freq: Two times a day (BID) | SUBCUTANEOUS | Status: DC
  Administered 2017-09-09: 22 [IU] via SUBCUTANEOUS
  Filled 2017-09-09 (×2): qty 0.22

## 2017-09-09 MED ORDER — PANTOPRAZOLE SODIUM 40 MG IV SOLR
40.0000 mg | INTRAVENOUS | Status: DC
  Administered 2017-09-09 – 2017-09-17 (×9): 40 mg via INTRAVENOUS
  Filled 2017-09-09 (×9): qty 40

## 2017-09-09 MED ORDER — DEXTROSE 50 % IV SOLN
25.0000 mL | Freq: Once | INTRAVENOUS | Status: AC
  Administered 2017-09-09: 25 mL via INTRAVENOUS

## 2017-09-09 MED ORDER — SODIUM CHLORIDE 0.9% FLUSH
10.0000 mL | Freq: Two times a day (BID) | INTRAVENOUS | Status: DC
  Administered 2017-09-09 – 2017-09-26 (×27): 10 mL

## 2017-09-09 NOTE — Progress Notes (Signed)
      [address]       [place], [address]             [phone]      Intubated and sedated  BP (!) 103/56   Pulse 89   Temp (!) 100.9 F (38.3 C)   Resp (!) 23   Ht  (1.803 m)   Wt 220 lb 10.9 oz (100.1 kg)   SpO2 95%   BMI 30.78 kg/m    Intake/Output Summary (Last 24 hours) at 09/09/17 1839 Last data filed at 09/09/17 1800  Gross per 24 hour  Intake          3906.55 ml  Output             3535 ml  Net           371.55 ml    Stable off nitric oxide. Trying to wean levophed  Febrile- on vanco and zosyn  Continue current care  Emmalie Haigh C. Dorris Fetch, MD [facility] [phone]

## 2017-09-09 NOTE — Procedures (Signed)
Bedside Bronchoscopy Procedure Note Clinton Harrington [id number] 11/22/1951  Procedure: Bronchoscopy Indications: Diagnostic evaluation of the airways, Obtain specimens for culture and/or other diagnostic studies and Remove secretions  Procedure Details: ET Tube Size: ET Tube secured at lip (cm): Bite block in place: Yes In preparation for procedure, Patient hyper-oxygenated with 100 % FiO2 Airway entered and the following bronchi were examined: RUL, RML, RLL, LUL, LLL and Bronchi.   Bronchoscope removed.  , Patient placed back on 60% FiO2 at conclusion of procedure.    Evaluation BP (!) 117/59   Pulse 80   Temp (!) 101.3 F (38.5 C)   Resp (!) 22   Ht  (1.803 m)   Wt 220 lb 10.9 oz (100.1 kg)   SpO2 99%   BMI 30.78 kg/m  Breath Sounds:Rhonch O2 sats: stable throughout Patient's Current Condition: stable Specimens:  Sent moderate amount of pink tinged Complications: No apparent complications Patient did tolerate procedure well.   Carlynn Spry 09/09/2017, 2:38 PM

## 2017-09-09 NOTE — Plan of Care (Signed)
Patient dropped sats down to 80's, O2 up to 100% bagged and lavaged by RT, Fio2 back to 60%. Patient's sedation turned back up.

## 2017-09-09 NOTE — Progress Notes (Signed)
Pharmacy Antibiotic Note  Clinton Harrington is a 66 y.o. male admitted on 08/23/2017 with infection prophylaxis post-centrimag placement. Pharmacy has been consulted for vancomycin dosing and is also on Zosyn per MD. Pt now post RVAD removal with concern for opacity on CXR. WBC elevated but is trending down post/op, pt afebrile.  Plan: -Continue vancomycin at  IV q24h -Continue Zosyn 3.375g IV q8h -Monitor VT as indicated, renal function, LOT, cultures  Height:  (180.3 cm) Weight: 220 lb 10.9 oz (100.1 kg) IBW/kg (Calculated) : 75.3  Temp (24hrs), Avg:99.3 F (37.4 C), Min:98.2 F (36.8 C), Max:100 F (37.8 C)   Recent Labs Lab 09/03/17 0800  09/06/17 1720  09/07/17 1653  09/08/17 0500  09/08/17 0958 09/08/17 1145 09/08/17 1410 09/08/17 1651 09/08/17 1715 09/09/17 0351  WBC 12.9*  < >  --   < > 17.7*  --  20.5*  --   --  25.3*  --  22.3*  --  17.1*  CREATININE  --   < >  --   < >  --   < > 1.67*  < > 1.60*  --  1.62* 1.69* 1.60* 1.63*  VANCOTROUGH 24*  --  18  --   --   --   --   --   --   --   --   --   --   --   < > = values in this interval not displayed.  Estimated Creatinine Clearance: 55.9 mL/min (A) (by C-G formula based on SCr of 1.63 mg/dL (H)).    Allergies  Allergen Reactions  . Aspirin Other (See Comments)    Stomach upset  . Latex Rash  . Oxycodone-Acetaminophen Itching   Dose Adjustments: 9/23 VT 16>>continue 1g q12 9/26 VT 24>>1500 q24 9/29 VT 18>>1500 q24 **10/1 extra 1250 mg sent to OR*  Antimicrobials this admission:  9/20 Vancomycin >> 9/20 Cefuroxime >> 9/22 9/22 Ceftazidime >>9/28 9/28 Zosyn>>  Microbiology results:  10/1 Sternal wound Cx: px 9/28 bld x2: 1o2 MR-CoNS 9/24 bld x2: ngF 9/24 UCx: ng 9/24 Sputum: ng 9/20 and 9/22 Sputum: neg  Fredonia Highland, PharmD PGY-2 Cardiology Pharmacy Resident Pager: [phone] 09/09/2017

## 2017-09-09 NOTE — Plan of Care (Signed)
Wasted 70cc of Fentanyl in sink, witnessed by Marlane Hatcher RN

## 2017-09-09 NOTE — Op Note (Signed)
NAMERENLEY, BANWART NO.:  [id number]  MEDICAL RECORD NO.:  [id number]  LOCATION:                                 FACILITY:  PHYSICIAN:  Kerin Perna, M.D.  DATE OF BIRTH:  1951-08-18  DATE OF PROCEDURE:  09/08/2017 DATE OF DISCHARGE:                              OPERATIVE REPORT   OPERATIONS: 1. Decannulation and removal of right ventricular assist device,     CentriMag pump, and cannulas. 2. Sternal closure. 3. Placement of Swan pulmonary artery catheter into left subclavian     vein. 4. Placement of dual-lumen right subclavian central line.  SURGEON:  Kerin Perna, MD.  ASSISTANT:  Jari Favre, PAC.  ANESTHESIA:  General.  PREOPERATIVE DIAGNOSIS:  Acute right ventricular failure from right ventricular infarct, status post CABG with placement of percutaneous CentriMag right ventricular assist device.  POSTOPERATIVE DIAGNOSIS:  Acute right ventricular failure from right ventricular infarct, status post CABG with placement of percutaneous CentriMag right ventricular assist device.  CLINICAL NOTE:  The patient is a 66 year old diabetic, who presented in shock with an RV infarct after acute MI and had a preop balloon pump. After cardiac catheterization demonstrated severe 3-vessel coronary artery disease including occlusion of the right coronary at its ostium. Surgical coronary revascularization was recommended.  I discussed the procedure of CABG in detail with the patient including the use of general anesthesia, location of the surgical incisions, and the expected postoperative recovery.  I discussed the potential need for right ventricular mechanical assist if his RV function was adequate after CABG.  This operation was performed approximately 7 days after his original operation after the RVAD flow was slowly weaned and serial echocardiogram showed gradual improvement in RV function.  The patient was sedated at the time of surgery on the  ventilator and although he could not provide written informed consent, he understood that he was to return to the operating room for removal of the cannulas and closure of his chest.  DESCRIPTION OF PROCEDURE:  The patient was brought directly from the ICU to the operating room, intubated, sedated with the CentriMag pump at over 3 L/minute.  He was placed carefully on the operating table, and general anesthesia was then induced through the previously placed endotracheal tube.  A transesophageal echo probe was placed.  This showed significant improvement in RV function, which now just had mild dysfunction and no significant TR.  The patient's chest, abdomen, and legs were prepped and draped as a sterile field.  The RVAD flows were decreased from 3 L to 2.5 L to 2 L/minute after a bolus of 20,000 units of heparin was given.  The previous sternal incision was opened.  There was a moderate amount of clotted and non-clotted blood in the anterior mediastinum.  The sternum was retracted.  The cannulation into the RV outflow tract of the pulmonary artery cannula was identified, and the tourniquets were unclamped.  The sutures holding the cannula in the right femoral vein, which extended up into the right atrium, were also divided in preparation of removal.  The ACT was documented as being therapeutic.  The patient was given volume, and the inotropes were slightly increased.  The RVAD flow was then cut down to 1.5 L and waited and observed for several minutes.  The flow was then cut down to 1 L/minute, and we also observed the patient's RV function and hemodynamics for several minutes.  We reduced the flow to 500 mL/minute and then clamped the cannulas and observed the patient for several minutes and he remained stable.  First, the cannula and the RV outflow tract was removed, and the previously placed pursestring sutures were tied and there was minimal bleeding.  Next, the 23-25  dual-stage lung cannula from the right femoral vein up into the right atrium was pulled with the obturator in place and this came out without difficulty. And hand pressure was held for 30 minutes.  Next, protamine was slowly given to reverse the dose of heparin, and there was improvement in coagulation function.  However, the patient required FFP and cryoprecipitate to provide adequate hemostasis.  The mediastinum was irrigated with warm vancomycin irrigation.  The previously placed chest tubes had all been removed.  The previously placed incision for the cannula and the previously placed chest tubes were irrigated, cleaned, and closed.  Three new chest tubes were placed in the anterior mediastinum, and both pleural spaces were brought through separate incisions.  The patient remained stable.  The sternal wires were then placed and the sternum was closed.  The patient remained stable.  Cardiac output was over 5 L/minute.  The chest wall was closed in layers using Vicryl and sterile dressings were applied.  The chest was still sterile.  I then placed a double-lumen central line in the right subclavian vein for new venous access and a clean line.  Next, I placed a sheath with a Swan-Ganz catheter and into the left subclavian vein using a Seldinger technique, and the Swan was floated out on the pulmonary artery, and then both central lines were secured to the skin and sterile dressings were applied.  The patient was then transported back to the ICU in critical but stable condition.  Cardiopulmonary bypass standby was available, but we did not use cardiopulmonary bypass for this operation.     Kerin Perna, M.D.   ______________________________ Kerin Perna, M.D.    PV/MEDQ  D:  09/08/2017  T:  09/08/2017  Job:  [id number]

## 2017-09-09 NOTE — Progress Notes (Signed)
Hypoglycemic Event  CBG: 60  Treatment: 25 ml D50 Symptoms: none  Follow-up CBG: 109       Clinton Harrington D [name]

## 2017-09-09 NOTE — Progress Notes (Signed)
Nitric turned off but left in room per MD request. Pt returned to 60% fio2 post bedside bronch. RN aware. RT will continue to monitor.

## 2017-09-09 NOTE — Progress Notes (Signed)
PULMONARY / CRITICAL CARE MEDICINE   Name: Clinton Harrington MRN: [id number] DOB: 12-01-1951    ADMISSION DATE:  08/23/2017 CONSULTATION DATE:  08/30/17  REFERRING MD: Dr Cornelius Moras  CHIEF COMPLAINT:  Acute resp failure, hypoxia, hypercapnia  brief:  Patient is a 66 y/o Caucasian M with hx CADs/p BMS to RCA in 1999 (due to an inferior STEMI), significant PVD, HTN, Type 2 DM (non-insulin dependent), HLP, Tobacco use comes in today complaining of substernal chest pain on admission.He continued to have active CP and the patient was taken to cath lab for further intervention. FOund with RV infacrct and required emergent bypass surgery. Remained in cardiogenic shock requiring balloon pump and placement of RVAD device. Remained on vent with acute resp failure, skin closed but sternum is not closed.He has developed worsening acidosis and hypoxia with bilateral int infiltrates. He has been pos 4 liters last 2 4 hours. He remains on levophed and milrinone. CCM was asked to assist with vent management.  CULTURES: 9/22 sputum>>>ng 9/24>> Sputum>>ng 9/24 Blood>>NEG  9/24 Urine>>ng 9/28 BC x 2 >GPC , ID panel +MRSA > 10/2>> Tracheal Aspirrate  ANTIBIOTICS: zinacef >>>9/22- 9/24 ceftaz 9/22>>>9/28 Vanc>>>9/24>> Zosyn 9/28 >>  SIGNIFICANT EVENTS: 08/23/2017 - admit 9/22 - intubated Cath cabg RV assist device 9/23 - On epi/levo/RVAD/vent.  CI 1.8 despite all interventions 9/24>> Epi, Levo, milrinine,amio,insulin gtt, Lasix gtt, Heparin, Fent, Versed 9/26 - Remains sedated on fent/ versed drips RVAD flows down to 3200 Good UO on lasix gtt @ 15 9/27 - 40% fio2, peep 8. RPM on RVAD turned down. Stil on levophed, milrinone, epi ggtt and heparin.admio gtt. Making urine. Has chest draines. Good urine outoput. Heparin on hold briefly before resumptions due to bleed in groin site. Getting 2 UI PRBC 9/30 mucous plugging requiring multiple lavages 10/1 RV assist device  removed  in the OR 2/2 bleeding 1-0/2  - Stable off RVAD, remains on levo, vaso,Epi, milrinine  SUBJECTIVE/OVERNIGHT/INTERVAL HX RV assist device  Removed 10/1 secondary to bleeding. Stable 10/2 am off RVAD, remains on epi, levo,vaso and milrinine pressors.  Opens eyes with WUA, but does not follow commands. CXR  10/2 with Progressive consolidation left mid to lower lobe ?  Represent atelectasis with pleural effusion / infiltrate. Underlying pulmonary vascular congestion present. Mucus plugging with desaturations overnight.  VITAL SIGNS: BP (!) 123/55   Pulse 81   Temp 99.9 F (37.7 C)   Resp 20   Ht  (1.803 m)   Wt 220 lb 10.9 oz (100.1 kg)   SpO2 95%   BMI 30.78 kg/m   HEMODYNAMICS: PAP: (23-39)/(12-27) 29/17 CVP:  [8 mmHg-21 mmHg] 14 mmHg CO:  [5.9 L/min-6.8 L/min] 6.6 L/min CI:  [2.7 L/min/m2-3.2 L/min/m2] 3.1 L/min/m2  VENTILATOR SETTINGS: Vent Mode: PRVC FiO2 (%):  [50 %-70 %] 50 % Set Rate:  [20 bmp] 20 bmp Vt Set:  [560 mL] 560 mL PEEP:  [5 cmH20-6 cmH20] 5 cmH20 Plateau Pressure:  [21 cmH20-24 cmH20] 23 cmH20  INTAKE / OUTPUT: I/O last 3 completed shifts: In: 8483.5 [I.V.:4642.8; Blood:2540.7; NG/GT:500; IV Piggyback:800] Out: 6450 [Urine:4110; Emesis/NG output:910; Blood:900; Chest Tube:530]  PHYSICAL EXAMINATION: General:  Critically ill obese male intubates and on vent, follows no commands HEENT: [place]/AT, PERRL, oral ETT, feeding tube Neuro: Sedated, opens eyes with WUA, but does not follow commands, or focus CV: RRR, S1, S2 , sternal dressing CDI PULM: Crackles throughout, diminished per bases GI: Obese,Soft, NT, ND and +BS Extremities: warm/dry , trace  edema  Skin: Midline  sternal dressing , no rash or lesions,pale  PULMONARY  Recent Labs Lab 09/08/17 0003 09/08/17 0411  09/08/17 0819 09/08/17 0955 09/08/17 0958 09/08/17 1159 09/08/17 1300 09/08/17 1715 09/09/17 0355 09/09/17 0401  PHART 7.331* 7.416  --   --  7.414  --  7.360  --   --  7.403  --   PCO2ART 51.7* 39.8  --    --  45.6  --  45.7  --   --  39.1  --   PO2ART 58.0* 78.0*  --   --  329.0*  --  83.0  --   --  108  --   HCO3 27.3 25.7  --   --  29.2*  --  25.9  --   --  23.9  --   TCO2 29 27  --  --  24  --   --   O2SAT 87.0 96.0  < >  --  100.0  --  96.0 83.5  --  98.2 72.5  < > = values in this interval not displayed.  CBC  Recent Labs Lab 09/08/17 1145 09/08/17 1651 09/08/17 1715 09/09/17 0351  HGB 9.0* 9.6* 9.5* 8.7*  HCT 26.0* 28.8* 28.0* 26.2*  WBC 25.3* 22.3*  --  17.1*  PLT 178 195  --  217    COAGULATION  Recent Labs Lab 09/05/17 0346 09/06/17 0500 09/07/17 0206 09/08/17 0500 09/08/17 1145  INR 1.16 1.35 1.56 1.61 1.58    CARDIAC  No results for input(s): TROPONINI in the last 168 hours. No results for input(s): PROBNP in the last 168 hours.   CHEMISTRY  Recent Labs Lab 09/03/17 0800  09/05/17 0346  09/06/17 0500  09/07/17 0206  09/08/17 0500 09/08/17 0819 09/08/17 0958 09/08/17 1144 09/08/17 1410 09/08/17 1651 09/08/17 1715 09/09/17 0351  NA  --   < > 137  < > 136  < > 135  < > 134* 136 137 138 133*  --  134* 133*  K  --   < > 3.6  < > 3.8  < > 3.6  < > 3.9 3.7 3.7 4.2 4.8  --  5.3* 4.6  CL  --   < > 93*  < > 94*  < > 99*  < > 102 98* 98*  --  102  --  100* 99*  CO2  --   < > 33*  --  31  --  27  --  24  --   --   --  23  --   --  25  GLUCOSE  --   < > 90  < > 91  < > 97  < > 75 88 116* 94 87  --  83 66  BUN  --   < > 55*  < > 54*  < > 63*  < > 64* 61* 58*  --  64*  --  59* 63*  CREATININE  --   < > 1.37*  < > 1.62*  < > 1.72*  < > 1.67* 1.60* 1.60*  --  1.62* 1.69* 1.60* 1.63*  CALCIUM  --   < > 8.6*  --  8.1*  --  7.9*  --  7.3*  --   --   --  7.3*  --   --  7.3*  MG 1.7  --  1.7  --   --   --   --   --   --   --   --   --   --  3.3*  --  3.0*  < > = values in this interval not displayed. Estimated Creatinine Clearance: 55.9 mL/min (A) (by C-G formula based on SCr of 1.63 mg/dL (H)).   LIVER  Recent Labs Lab 09/03/17 0255  09/04/17 0324 09/05/17 0346 09/06/17 0500 09/07/17 0206 09/08/17 0500 09/08/17 1145  AST 45* 41  --  45* 41 60*  --   ALT 19 20  --  18 15* 18  --   ALKPHOS 97 82  --  59 83 52  --   BILITOT 0.9 0.7  --  0.6 0.8 0.7  --   PROT 5.6* 5.3*  --  5.8* 4.9* 5.1*  --   ALBUMIN 2.1* 1.9*  --  2.2* 1.9* 1.8*  --   INR 1.11 1.15 1.16 1.35 1.56 1.61 1.58     INFECTIOUS No results for input(s): LATICACIDVEN, PROCALCITON in the last 168 hours.   ENDOCRINE CBG (last 3)   Recent Labs  09/09/17 0354 09/09/17 0417 09/09/17 0749  GLUCAP 60* 109* 97         IMAGING x48h  - image(s) personally visualized  -   highlighted in bold Dg Chest Port 1 View  Result Date: 09/09/2017 CLINICAL DATA:  66 year old male post CABG.  Subsequent encounter. EXAM: PORTABLE CHEST 1 VIEW COMPARISON:  09/08/2017. FINDINGS: Endotracheal tube tip 6 cm above the carina. Left-sided Swan-Ganz catheter tip main pulmonary artery level. Bilateral chest tubes in place without gross pneumothorax. Post CABG.  Cardiomegaly.  Calcified tortuous aorta. Progressive consolidation left mid to lower lobe may represent atelectasis with pleural effusion although infiltrate not excluded. Component of underlying pulmonary vascular congestion. IMPRESSION: Progressive consolidation left mid to lower lobe may represent atelectasis with pleural effusion although infiltrate not excluded. Component of underlying pulmonary vascular congestion. Cardiomegaly post CABG. Aortic Atherosclerosis (ICD10-I70.0).  Calcified tortuous aorta Right central line and mediastinal drain appear to have been removed. Electronically Signed   By: Lacy Duverney M.D.   On: 09/09/2017 07:43   Dg Chest Port 1 View  Result Date: 09/08/2017 CLINICAL DATA:  Postop cardiac surgery. EXAM: PORTABLE CHEST 1 VIEW COMPARISON:  09/08/2017 at 0524 hours FINDINGS: Sequelae of CABG are again identified. Endotracheal tube terminates 4.7 cm above the carina. Enteric tube courses  into the left upper abdomen with tip not imaged. Right jugular Swan-Ganz catheter has been removed. Right jugular sheath remains. Right PICC terminates over the cavoatrial junction or high right atrium. A new left subclavian Swan-Ganz catheter terminates over the main pulmonary artery. A mediastinal drain and bilateral chest tubes remain in place. Cannulae for external ventricular assist device have been removed in the interim. The cardiac silhouette remains enlarged. Aortic atherosclerosis is noted. Patchy right upper lobe opacity is similar to the prior study. Patchy left basilar opacity is mildly improved. A small left pleural effusion is again noted. No definite residual pneumothorax is identified laterally in the left hemithorax as on today's earlier study, although there is a small amount of curvilinear lucency in the left lung base along the hemidiaphragm. IMPRESSION: 1. Support devices with interval changes as above. 2. Possible small loculated pneumothorax in the left lung base. 3. Mildly improved left lung base aeration. Unchanged right upper lobe atelectasis or infiltrate. Electronically Signed   By: Sebastian Ache M.D.   On: 09/08/2017 12:42   Dg Chest Port 1 View  Result Date: 09/08/2017 CLINICAL DATA:  Respiratory failure. EXAM: PORTABLE CHEST 1 VIEW COMPARISON:  Radiograph of September 07, 2017. FINDINGS: Stable cardiomegaly.  Endotracheal and nasogastric tubes are unchanged in position. Right-sided PICC line is unchanged. Right internal jugular Swan-Ganz catheter is noted with distal tip in expected position of main pulmonary artery. Bilateral chest tubes are noted. No pneumothorax is noted on the right. Apical pneumothorax on the prior exam is significantly improved, with only mild pneumothorax seen laterally in the left lung. Right upper lobe infiltrate is noted. Increased left basilar atelectasis and effusion is noted. Bony thorax is unremarkable. Aortic atherosclerosis. IMPRESSION: Stable  support apparatus. Left pneumothorax noted on prior exam is significantly improved. Right upper lobe atelectasis or infiltrate is noted. Increase left basilar opacity is noted concerning for worsening atelectasis and effusion. Electronically Signed   By: Lupita Raider, M.D.   On: 09/08/2017 07:37   Dg Abd Portable 1v  Result Date: 09/08/2017 CLINICAL DATA:  Status post feeding tube placement. EXAM: PORTABLE ABDOMEN - 1 VIEW COMPARISON:  Abdominal radiograph of August 29, 2017 FINDINGS: A radiodense tipped feeding tube is present appears to lie in the transverse portion of the duodenum. This is not as quite distally positioned as on the previous study. There is some gas within the stomach. The retrocardiac region on the left remains dense. IMPRESSION: The tip of the feeding tube lies in the transverse portion of the duodenum. Electronically Signed   By: [name]  [place] M.D.   On: 09/08/2017 15:32   ASSESSMENT / PLAN:  PULMONARY A: 09/09/2017 -> acute resp failure due to cardiogenic shock Remains pressor dependent/ sedated Not following commands on WUA Mucus plugging overnight with desaturation ? Infiltrate per CXR vs atelectasis Pulmonary edema>> will need diuresis before weaning.>> + 2.4 L last 24  P:   Continue full vent support given hemodynamics Continue CXR and ABG daily Titrate O2 for sat of 88-92% Consider bronch when stable enough Consider mucolytic nebs Continue lasix gtt>> need net negative to wean Send tracheal aspirate for culture  CARDIOVASCULAR A:  09/09/2017 -> cardiogenic shock  P:  Per cards and cvts RVAD  removed 10/2 Remains Pressor dependent Maintain MAP > 65   RENAL  Intake/Output Summary (Last 24 hours) at 09/09/17 0848 Last data filed at 09/09/17 0800  Gross per 24 hour  Intake           6061.8 ml  Output             3875 ml  Net           2186.8 ml    Recent Labs Lab 09/08/17 0958 09/08/17 1410 09/08/17 1651 09/08/17 1715 09/09/17 0351   CREATININE 1.60* 1.62* 1.69* 1.60* 1.63*  A:   Acute renal failure, UOP remains as above  P:   Monitor renal function Avoid nephro toxic medications Trend UOP Replace electrolytes as needed  Trend BMET daily   GASTROINTESTINAL A:   Malnutrition , tolerating TF  GIB - continued drk blood in OGT , but less than over weekend P:   TF PPI q12  ?d/c anti-coagulation now that RVAD  D/C'd Monitor for worsening  HEMATOLOGIC  Recent Labs Lab 09/08/17 1145 09/08/17 1651 09/08/17 1715 09/09/17 0351  HGB 9.0* 9.6* 9.5* 8.7*  HCT 26.0* 28.8* 28.0* 26.2*  WBC 25.3* 22.3*  --  17.1*  PLT 178 195  --  217   A:   RBC: anemia criticall illness>hbg drop /GIB s/p 4 u PRBC 9/29 -9/30, HGB drop .8 gram last 24 Platelet *normal Leukocytosis  P:  - Trend CBC - PRBC for hgb </= 8-8.5gm%   -  Transfuse per ICU protocol - ?D/C anti-coag, will defer to CVTS   INFECTIOUS  A:   Possible PNA  ?MRSA Bactermia  P:   - Cont on Vanc /Zosyn  - Follow cx data  - Send tracheal aspirate for culture - Trend fever curve and WBC  ENDOCRINE A:   Hyperglycemia improved after reducing stress dose steroids P:   - ICU hyperglycemia protocol - Continue hydrocortisone 50 mg daily  - CBG Q 4  - SSI   NEUROLOGIC A:   #Current: Not following commands on WUA per nursing  P:   RASS goal: 0 to -3 Fent and versed gtt to continue Precedex to continue  FAMILY  - Updates: No family bedside 10/1.  Bevelyn Ngo, AGACNP-BC [facility] Pulmonary/Critical Care Medicine. Pager: [phone].  Attending Note:  66 year old male with extensive PMH who presents to the hospital with cardiogenic shock and respiratory failure.  Intubated.  RVAD is out.  Remains in shock, on epi, levo, vaso and milrinone.  On exam, diffuse crackles.  I reviewed CXR myself, ETT to move down 3 cm.  Bronch later on today for mucous plugging.  Continue hemodynamic support.  The patient is critically ill with multiple organ  systems failure and requires high complexity decision making for assessment and support, frequent evaluation and titration of therapies, application of advanced monitoring technologies and extensive interpretation of multiple databases.   Critical Care Time devoted to patient care services described in this note is  35  Minutes. This time reflects time of care of this signee Dr Koren Bound. This critical care time does not reflect procedure time, or teaching time or supervisory time of PA/NP/Med student/Med Resident etc but could involve care discussion time.  Alyson Reedy, M.D. [facility] Pulmonary/Critical Care Medicine. Pager: [phone]. After hours pager: [phone].  09/09/2017 at [phone]

## 2017-09-09 NOTE — Consult Note (Signed)
WOC Nurse wound consult note Reason for Consult: new DTPI Patient with multiple trips to the OR for long procedures, currently on vasopressors and sedated on the vent Reported that sacral prophylactic dressing was in incorrect position. Wound type: deep tissue pressure injury Pressure Injury POA: No Measurement: Right buttock: 3.5cm x 3.0cm x 0cm; 100% dark purple with some superficial skin lifting no fluid in blister Left buttock: 4.5cm x 3.0cm x 0.1cm; 100% red with some superficial skin peeling  Wound bed: see above Drainage (amount, consistency, odor) minimal, serosanguinous  Periwound: intact  Dressing procedure/placement/frequency: Continue soft silicone dressing for protection.  SPORT mattress in place for pressure re-distribution.  Monitor for evolution of injury.  No family in the room to discuss skin injury/skin failure.  WOC Nurse team will follow along with you for weekly wound assessments.  Please notify me of any acute changes in the wounds or any new areas of concerns Plez Belton [facility] MSN, RN,CWOCN, CNS, CWON-AP [phone]

## 2017-09-09 NOTE — Progress Notes (Addendum)
Patient ID: Clinton Harrington, male   DOB: [birth year], 66 y.o.   MRN: [id number]        Advanced Heart Failure Rounding Note   Subjective:    Events CABG x4 IABP Centrimag 9/20    IABP removed 9/24.  Centrimag RVAD in place DC-CV successful 9/29  Centrimag removal 10/1  Remains sedated intubated on 60%. On 4  ppm NO, milrinone 0.25 mcg, norepi 23mg, epi 6 mcg, and amio 30 mg per hour.   I/Os positive yesterday and weight up.  Creatinine stable at 1.6.   CXR (10/1) with possible loculated left basilar PTX and RUL atelectasis/infiltrate.   Swan numbers: CVP 15  PA 26/14  Fick CO/CI 6.0 / 2.8.   C-ox 73%  Echo (09/01/17): EF 65-70% with moderate LV hypertrophy, mildly dilated RV with moderately decreased systolic function, IVC dilated.   Objective:   Weight Range: 220 lb 10.9 oz (100.1 kg) Body mass index is 30.78 kg/m.   Vital Signs:   Temp:  [98.1 F (36.7 C)-100 F (37.8 C)] 100 F (37.8 C) (10/02 0700) Pulse Rate:  [80-96] 81 (10/02 0700) Resp:  [4-22] 21 (10/02 0700) BP: (95-123)/(38-60) 117/55 (10/02 0700) SpO2:  [91 %-100 %] 98 % (10/02 0700) Arterial Line BP: (83-137)/(35-57) 128/48 (10/02 0500) FiO2 (%):  [50 %-70 %] 60 % (10/02 0300) Weight:  [220 lb 10.9 oz (100.1 kg)] 220 lb 10.9 oz (100.1 kg) (10/02 0500) Last BM Date: 08/29/17 (MD aware)  Weight change: Filed Weights   09/07/17 0500 09/08/17 0500 09/09/17 0500  Weight: 210 lb 1.6 oz (95.3 kg) 218 lb 0.6 oz (98.9 kg) 220 lb 10.9 oz (100.1 kg)    Intake/Output:   Intake/Output Summary (Last 24 hours) at 09/09/17 0706 Last data filed at 09/09/17 0700  Gross per 24 hour  Intake          6212.92 ml  Output             3810 ml  Net          2402.92 ml      Physical Exam  CVP 15 General:  Intubated sedated.  HEENT: normal Neck: supple. JVP to jaw.  Carotids 2+ bilat; no bruits. No lymphadenopathy or thryomegaly appreciated. Cor: PMI nondisplaced. Regular rate & rhythm. No rubs, gallops or  murmurs. Sternal incision. Lungs: Rhonchi throughout  Abdomen: soft, nontender, nondistended. No hepatosplenomegaly. No bruits or masses. Good bowel sounds. Extremities: no cyanosis, clubbing, rash, edema. R and LLE SCDs RUE PICC Neuro: sedated GU: Foley  Skin: L Buttock Partial Thickness R Buttock Non blanching dark purple    Telemetry   NSR  80s personally reviewed.   EKG    10/1 Sinus Rhythm 90 bpm qtc 560 ms.   Labs    CBC  Recent Labs  09/08/17 1651 09/08/17 1715 09/09/17 0351  WBC 22.3*  --  17.1*  HGB 9.6* 9.5* 8.7*  HCT 28.8* 28.0* 26.2*  MCV 86.2  --  86.5  PLT 195  --  2283  Basic Metabolic Panel  Recent Labs  09/08/17 1410 09/08/17 1651 09/08/17 1715 09/09/17 0351  NA 133*  --  134* 133*  K 4.8  --  5.3* 4.6  CL 102  --  100* 99*  CO2 23  --   --  25  GLUCOSE 87  --  83 66  BUN 64*  --  59* 63*  CREATININE 1.62* 1.69* 1.60* 1.63*  CALCIUM 7.3*  --   --  7.3*  MG  --  3.3*  --  3.0*   Liver Function Tests  Recent Labs  09/07/17 0206 09/08/17 0500  AST 41 60*  ALT 15* 18  ALKPHOS 83 52  BILITOT 0.8 0.7  PROT 4.9* 5.1*  ALBUMIN 1.9* 1.8*   No results for input(s): LIPASE, AMYLASE in the last 72 hours. Cardiac Enzymes No results for input(s): CKTOTAL, CKMB, CKMBINDEX, TROPONINI in the last 72 hours.  BNP: BNP (last 3 results)  Recent Labs  08/23/17 1927  BNP 200.0*    ProBNP (last 3 results) No results for input(s): PROBNP in the last 8760 hours.   D-Dimer No results for input(s): DDIMER in the last 72 hours. Hemoglobin A1C No results for input(s): HGBA1C in the last 72 hours. Fasting Lipid Panel No results for input(s): CHOL, HDL, LDLCALC, TRIG, CHOLHDL, LDLDIRECT in the last 72 hours. Thyroid Function Tests No results for input(s): TSH, T4TOTAL, T3FREE, THYROIDAB in the last 72 hours.  Invalid input(s): FREET3  Other results:   Imaging    Dg Chest Port 1 View  Result Date: 09/08/2017 CLINICAL DATA:  Postop  cardiac surgery. EXAM: PORTABLE CHEST 1 VIEW COMPARISON:  09/08/2017 at 0524 hours FINDINGS: Sequelae of CABG are again identified. Endotracheal tube terminates 4.7 cm above the carina. Enteric tube courses into the left upper abdomen with tip not imaged. Right jugular Swan-Ganz catheter has been removed. Right jugular sheath remains. Right PICC terminates over the cavoatrial junction or high right atrium. A new left subclavian Swan-Ganz catheter terminates over the main pulmonary artery. A mediastinal drain and bilateral chest tubes remain in place. Cannulae for external ventricular assist device have been removed in the interim. The cardiac silhouette remains enlarged. Aortic atherosclerosis is noted. Patchy right upper lobe opacity is similar to the prior study. Patchy left basilar opacity is mildly improved. A small left pleural effusion is again noted. No definite residual pneumothorax is identified laterally in the left hemithorax as on today's earlier study, although there is a small amount of curvilinear lucency in the left lung base along the hemidiaphragm. IMPRESSION: 1. Support devices with interval changes as above. 2. Possible small loculated pneumothorax in the left lung base. 3. Mildly improved left lung base aeration. Unchanged right upper lobe atelectasis or infiltrate. Electronically Signed   By: Logan Bores M.D.   On: 09/08/2017 12:42   Dg Abd Portable 1v  Result Date: 09/08/2017 CLINICAL DATA:  Status post feeding tube placement. EXAM: PORTABLE ABDOMEN - 1 VIEW COMPARISON:  Abdominal radiograph of August 29, 2017 FINDINGS: A radiodense tipped feeding tube is present appears to lie in the transverse portion of the duodenum. This is not as quite distally positioned as on the previous study. There is some gas within the stomach. The retrocardiac region on the left remains dense. IMPRESSION: The tip of the feeding tube lies in the transverse portion of the duodenum. Electronically Signed    By: [name]  Martinique M.D.   On: 09/08/2017 15:32     Medications:     Scheduled Medications: . aspirin EC  325 mg Oral Daily   Or  . aspirin  324 mg Per Tube Daily  . atorvastatin  80 mg Per Tube q1800  . bisacodyl  10 mg Oral Daily   Or  . bisacodyl  10 mg Rectal Daily  . brimonidine  1 drop Right Eye BID  . chlorhexidine gluconate (MEDLINE KIT)  15 mL Mouth Rinse BID  . docusate sodium  200 mg Oral  Daily  . fentaNYL (SUBLIMAZE) injection  50 mcg Intravenous Once  . furosemide  20 mg Intravenous BID  . hydrocortisone sod succinate (SOLU-CORTEF) inj  50 mg Intravenous Daily  . insulin aspart  0-24 Units Subcutaneous Q4H  . mouth rinse  15 mL Mouth Rinse QID  . metoprolol tartrate  12.5 mg Oral BID   Or  . metoprolol tartrate  12.5 mg Per Tube BID  . [START ON 09/10/2017] pantoprazole  40 mg Oral Daily  . sodium chloride flush  3 mL Intravenous Q12H    Infusions: . sodium chloride Stopped (09/08/17 1800)  . sodium chloride    . sodium chloride 20 mL/hr at 09/09/17 0700  . amiodarone 30 mg/hr (09/09/17 0700)  . dexmedetomidine (PRECEDEX) IV infusion 0.502 mcg/kg/hr (09/09/17 0700)  . EPINEPHrine 4 mg in dextrose 5% 250 mL infusion (16 mcg/mL) 6 mcg/min (09/09/17 0700)  . fentaNYL infusion INTRAVENOUS 200 mcg/hr (09/09/17 0700)  . lactated ringers    . lactated ringers    . lactated ringers 10 mL/hr at 09/09/17 0700  . midazolam (VERSED) infusion 4 mg/hr (09/09/17 0700)  . milrinone 0.2 mcg/kg/min (09/09/17 0700)  . norepinephrine (LEVOPHED) Adult infusion 16 mcg/min (09/09/17 0700)  . piperacillin-tazobactam (ZOSYN)  IV 3.375 g (09/09/17 0512)  . vancomycin Stopped (09/08/17 1918)  . vasopressin (PITRESSIN) infusion - *FOR SHOCK* 0.02 Units/min (09/09/17 0700)    PRN Medications: sodium chloride, fentaNYL, lactated ringers, metoprolol tartrate, midazolam, ondansetron (ZOFRAN) IV, oxyCODONE, sodium chloride flush, traMADol    Patient Profile   Clinton Harrington is a 66 year  old with a history of DMII, smoker, CAD S/P PCI RCA 20 years ago, HTN, PAD, and hyperlipidemia admitted with CP and inferior MI with RV infarction.  He underwent LHC with IABP. After optimized, underwent CABG x4 centrimag for RV infarct on 9/20 and continued IABP.   Assessment/Plan   1. Post-cardiotomy cardiogenic shock: Primarily RV failure from inferior infarct with RV involvement.  Repeat echo on 9/24 showed vigorous LV systolic function with moderate RV dysfunction.  Now s/p CABG and Centrimag for RV support on 9/20. IABP removed 9/24 with good LV function.  Centrimag out 10/1.  Currently on epi 6 mcg, milrinone 0.25 mcg, norepi 16 mcg, and NO 4 ppm. - Diuretics per Dr Darcey Nora. CVP remains 15 and weight up, think he needs increased Lasix dosing.  - Continue to wean norepinephrine today.  2. CAD: Inferior MI with RV involvement at presentation, now s/p CABG.  He is getting ASA.   - Continue statin. No change.  3. Atrial fibrillation/flutter: DCCV 9/29, now remains in NSR.  - Maintaining NSR. Continue amio 30 mg per hour.  - Currently off anticoagulation.  Needs to restart anticoagulation when deemed stable by TCTS.   Can start with heparin gtt but would transition to Eliquis eventually.   4. AKI: Creatinine unchanged at 1.6.  5. Diabetes: Difficult to control, appreciate help from diabetes coordinator. No change.  6. Hyponatremia: Stable. Sodium 133.   7. ID: Coag negative Staph on 9/28 blood culture, possible HCAP versus contaminant.  - Covering for HCAP with vancomycin/Zosyn.  8. Anemia: Received 2 uPRBCs 09/04/17. 4 units PRBCs on 9/29. Received 2UPRBCs 10/1.  Hgb stable.  9. Coffee grounds in NGT- resolved.  - Continue IV protonix 10. L Buttock - Suspected Deep Tissue Injury Pressure Ulcer . This may evolve to full thickness tissue loss. Continue foam dressing and reposition R-L every 2 hours. Consult WOC.  11. R Buttock- Stage 2  Pressure Ulcer. Continue foam dressing and reposition R-L  every 2 hours.   Darrick Grinder, NP  09/09/2017, 7:06 AM  Advanced Heart Failure Team Pager [phone] (M-F; [place])  Please contact [facility] for night-coverage after hours (4p -7a ) and weekends on [email or web address]  Patient seen with NP, agree with the above note.   He is stable.  Titrating down slowly on norepinephrine.  Continue to work on this then epinephrine.   Pressure ulcers on buttocks need attention as above.   CVP 15 today, volume overloaded with I/Os positive.  Creatinine stable at 1.6.  Needs further diuresis, would recommend increasing Lasix today.   Tm 100, continues on empiric vanco/Zosyn (only culture positive with Coag neg Staph, ?contaminant).   Recent DCCV to NSR.  Needs to restart anticoagulation when deemed stable for this by TCTS.  Can start with heparin gtt but would eventually transition to Eliquis.   Loralie Champagne 09/09/2017 7:49 AM

## 2017-09-09 NOTE — Progress Notes (Signed)
1 Day Post-Op Procedure(s) (LRB): REMOVAL OF R-VENTRICULAR ASSIST DEVICE WITH CARDIOPULMONARY BYPASS (N/A) TRANSESOPHAGEAL ECHOCARDIOGRAM (TEE) (N/A) Subjective: Stable off RVAD nsr CI 2.5 cvp 14 Needs diuresis before vent wean TF - nutrition started  Objective: Vital signs in last 24 hours: Temp:  [98.2 F (36.8 C)-100 F (37.8 C)] 100 F (37.8 C) (10/02 0800) Pulse Rate:  [80-91] 81 (10/02 0800) Cardiac Rhythm: Normal sinus rhythm (10/01 1930) Resp:  [13-22] 20 (10/02 0800) BP: (95-129)/(38-62) 117/55 (10/02 0800) SpO2:  [91 %-100 %] 97 % (10/02 0800) Arterial Line BP: (83-147)/(35-57) 119/45 (10/02 0800) FiO2 (%):  [50 %-70 %] 60 % (10/02 0300) Weight:  [220 lb 10.9 oz (100.1 kg)] 220 lb 10.9 oz (100.1 kg) (10/02 0500)  Hemodynamic parameters for last 24 hours: PAP: (23-39)/(12-27) 25/15 CVP:  [8 mmHg-21 mmHg] 14 mmHg CO:  [5.9 L/min-6.8 L/min] 6.1 L/min CI:  [2.7 L/min/m2-3.2 L/min/m2] 2.8 L/min/m2  Intake/Output from previous day: 10/01 0701 - 10/02 0700 In: 6212.9 [I.V.:3277.3; Blood:2225.7; NG/GT:10; IV Piggyback:700] Out: 3810 [Urine:2450; Emesis/NG output:100; Blood:900; Chest Tube:360] Intake/Output this shift: No intake/output data recorded.       Exam    General- alert and comfortable   Lungs- clear without rales, wheezes   Cor- regular rate and rhythm, no murmur , gallop   Abdomen- soft, non-tender   Extremities - warm, non-tender, minimal edema   Neuro- oriented, appropriate, no focal weakness   Lab Results:  Recent Labs  09/08/17 1651 09/08/17 1715 09/09/17 0351  WBC 22.3*  --  17.1*  HGB 9.6* 9.5* 8.7*  HCT 28.8* 28.0* 26.2*  PLT 195  --  217   BMET:  Recent Labs  09/08/17 1410  09/08/17 1715 09/09/17 0351  NA 133*  --  134* 133*  K 4.8  --  5.3* 4.6  CL 102  --  100* 99*  CO2 23  --   --  25  GLUCOSE 87  --  83 66  BUN 64*  --  59* 63*  CREATININE 1.62*  < > 1.60* 1.63*  CALCIUM 7.3*  --   --  7.3*  < > = values in this  interval not displayed.  PT/INR:  Recent Labs  09/08/17 1145  LABPROT 18.7*  INR 1.58   ABG    Component Value Date/Time   PHART 7.403 09/09/2017 0355   HCO3 23.9 09/09/2017 0355   TCO2 24 09/08/2017 1715   ACIDBASEDEF 0.3 09/09/2017 0355   O2SAT 72.5 09/09/2017 0401   CBG (last 3)   Recent Labs  09/08/17 2355 09/09/17 0354 09/09/17 0417  GLUCAP 71 60* 109*    Assessment/Plan: S/P Procedure(s) (LRB): REMOVAL OF R-VENTRICULAR ASSIST DEVICE WITH CARDIOPULMONARY BYPASS (N/A) TRANSESOPHAGEAL ECHOCARDIOGRAM (TEE) (N/A) Wean vasopressin as tolerated Lasix drip  forCXR with inc opacity L lung- send tracheal aspirate   LOS: 17 days    Clinton Harrington 09/09/2017

## 2017-09-10 ENCOUNTER — Inpatient Hospital Stay (HOSPITAL_COMMUNITY): Payer: PPO

## 2017-09-10 LAB — TYPE AND SCREEN
ABO/RH(D): O POS
Antibody Screen: NEGATIVE
Unit division: 0
Unit division: 0
Unit division: 0
Unit division: 0
Unit division: 0

## 2017-09-10 LAB — COMPREHENSIVE METABOLIC PANEL
ALT: 26 U/L (ref 17–63)
AST: 86 U/L — ABNORMAL HIGH (ref 15–41)
Albumin: 1.8 g/dL — ABNORMAL LOW (ref 3.5–5.0)
Alkaline Phosphatase: 90 U/L (ref 38–126)
Anion gap: 9 (ref 5–15)
BUN: 65 mg/dL — ABNORMAL HIGH (ref 6–20)
CO2: 25 mmol/L (ref 22–32)
Calcium: 7.2 mg/dL — ABNORMAL LOW (ref 8.9–10.3)
Chloride: 100 mmol/L — ABNORMAL LOW (ref 101–111)
Creatinine, Ser: 1.78 mg/dL — ABNORMAL HIGH (ref 0.61–1.24)
GFR calc Af Amer: 45 mL/min — ABNORMAL LOW (ref >60)
GFR calc non Af Amer: 39 mL/min — ABNORMAL LOW (ref >60)
Glucose, Bld: 223 mg/dL — ABNORMAL HIGH (ref 65–99)
Potassium: 3.9 mmol/L (ref 3.5–5.1)
Sodium: 134 mmol/L — ABNORMAL LOW (ref 135–145)
Total Bilirubin: 0.7 mg/dL (ref 0.3–1.2)
Total Protein: 5.7 g/dL — ABNORMAL LOW (ref 6.5–8.1)

## 2017-09-10 LAB — BPAM RBC
Blood Product Expiration Date: 201810162359
Blood Product Expiration Date: 201810162359
Blood Product Expiration Date: 201810182359
Blood Product Expiration Date: 201810182359
Blood Product Expiration Date: 201810242359
ISSUE DATE / TIME: 201809301738
ISSUE DATE / TIME: 201810010719
ISSUE DATE / TIME: 201810010719
ISSUE DATE / TIME: 201810010925
ISSUE DATE / TIME: 201810011450
Unit Type and Rh: 5100
Unit Type and Rh: 5100
Unit Type and Rh: 5100
Unit Type and Rh: 5100
Unit Type and Rh: 5100

## 2017-09-10 LAB — CBC
HCT: 24.2 % — ABNORMAL LOW (ref 39.0–52.0)
Hemoglobin: 8.2 g/dL — ABNORMAL LOW (ref 13.0–17.0)
MCH: 30.3 pg (ref 26.0–34.0)
MCHC: 33.9 g/dL (ref 30.0–36.0)
MCV: 89.3 fL (ref 78.0–100.0)
Platelets: 274 10*3/uL (ref 150–400)
RBC: 2.71 MIL/uL — ABNORMAL LOW (ref 4.22–5.81)
RDW: 15.6 % — ABNORMAL HIGH (ref 11.5–15.5)
WBC: 13.1 10*3/uL — ABNORMAL HIGH (ref 4.0–10.5)

## 2017-09-10 LAB — BLOOD GAS, ARTERIAL
Acid-base deficit: 0.5 mmol/L (ref 0.0–2.0)
Bicarbonate: 23.7 mmol/L (ref 20.0–28.0)
Drawn by: 270271
FIO2: 40
MECHVT: 560 mL
O2 Saturation: 98.8 %
PEEP: 5 cmH2O
Patient temperature: 98.6
RATE: 20 resp/min
pCO2 arterial: 39.8 mmHg (ref 32.0–48.0)
pH, Arterial: 7.392 (ref 7.350–7.450)
pO2, Arterial: 132 mmHg — ABNORMAL HIGH (ref 83.0–108.0)

## 2017-09-10 LAB — PHOSPHORUS: Phosphorus: 4.3 mg/dL (ref 2.5–4.6)

## 2017-09-10 LAB — GLUCOSE, CAPILLARY
GLUCOSE-CAPILLARY: 259 mg/dL — AB (ref 65–99)
GLUCOSE-CAPILLARY: 271 mg/dL — AB (ref 65–99)
GLUCOSE-CAPILLARY: 318 mg/dL — AB (ref 65–99)
Glucose-Capillary: 157 mg/dL — ABNORMAL HIGH (ref 65–99)
Glucose-Capillary: 180 mg/dL — ABNORMAL HIGH (ref 65–99)
Glucose-Capillary: 195 mg/dL — ABNORMAL HIGH (ref 65–99)
Glucose-Capillary: 222 mg/dL — ABNORMAL HIGH (ref 65–99)

## 2017-09-10 LAB — POCT I-STAT, CHEM 8
BUN: 69 mg/dL — ABNORMAL HIGH (ref 6–20)
CREATININE: 1.8 mg/dL — AB (ref 0.61–1.24)
Calcium, Ion: 1.01 mmol/L — ABNORMAL LOW (ref 1.15–1.40)
Chloride: 98 mmol/L — ABNORMAL LOW (ref 101–111)
Glucose, Bld: 333 mg/dL — ABNORMAL HIGH (ref 65–99)
HEMATOCRIT: 23 % — AB (ref 39.0–52.0)
HEMOGLOBIN: 7.8 g/dL — AB (ref 13.0–17.0)
POTASSIUM: 5 mmol/L (ref 3.5–5.1)
Sodium: 135 mmol/L (ref 135–145)
TCO2: 25 mmol/L (ref 22–32)

## 2017-09-10 LAB — CULTURE, BLOOD (ROUTINE X 2)
Culture: NO GROWTH
Special Requests: ADEQUATE

## 2017-09-10 LAB — MAGNESIUM: MAGNESIUM: 2.6 mg/dL — AB (ref 1.7–2.4)

## 2017-09-10 LAB — COOXEMETRY PANEL
Carboxyhemoglobin: 0.9 % (ref 0.5–1.5)
Methemoglobin: 1.3 % (ref 0.0–1.5)
O2 Saturation: 75.3 %
Total hemoglobin: 8.6 g/dL — ABNORMAL LOW (ref 12.0–16.0)

## 2017-09-10 LAB — PREPARE RBC (CROSSMATCH)

## 2017-09-10 MED ORDER — INSULIN GLARGINE 100 UNIT/ML ~~LOC~~ SOLN
30.0000 [IU] | Freq: Two times a day (BID) | SUBCUTANEOUS | Status: DC
Start: 2017-09-10 — End: 2017-09-12
  Administered 2017-09-10 – 2017-09-11 (×4): 30 [IU] via SUBCUTANEOUS
  Filled 2017-09-10 (×5): qty 0.3

## 2017-09-10 MED ORDER — SODIUM CHLORIDE 0.9 % IV SOLN: INTRAVENOUS | Status: DC | PRN

## 2017-09-10 MED ORDER — VITAL 1.5 CAL PO LIQD
1000.0000 mL | ORAL | Status: DC
  Administered 2017-09-10 – 2017-09-13 (×4): 1000 mL
  Filled 2017-09-10 (×8): qty 1000

## 2017-09-10 MED ORDER — FLUCONAZOLE 100MG IVPB
100.0000 mg | INTRAVENOUS | Status: DC
  Administered 2017-09-10 – 2017-09-13 (×4): 100 mg via INTRAVENOUS
  Filled 2017-09-10 (×5): qty 50

## 2017-09-10 MED ORDER — POTASSIUM CHLORIDE 10 MEQ/50ML IV SOLN: 10.0000 meq | INTRAVENOUS | Status: DC

## 2017-09-10 MED ORDER — FUROSEMIDE 10 MG/ML IJ SOLN: 40.0000 mg | Freq: Two times a day (BID) | INTRAMUSCULAR | Status: DC

## 2017-09-10 MED ORDER — POTASSIUM CHLORIDE 10 MEQ/50ML IV SOLN
10.0000 meq | INTRAVENOUS | Status: AC
  Administered 2017-09-10 (×2): 10 meq via INTRAVENOUS
  Filled 2017-09-10 (×2): qty 50

## 2017-09-10 MED ORDER — ALBUMIN HUMAN 25 % IV SOLN
12.5000 g | Freq: Four times a day (QID) | INTRAVENOUS | Status: AC
  Administered 2017-09-10 – 2017-09-11 (×4): 12.5 g via INTRAVENOUS
  Filled 2017-09-10 (×4): qty 50

## 2017-09-10 MED ORDER — ENOXAPARIN SODIUM 30 MG/0.3ML ~~LOC~~ SOLN
30.0000 mg | SUBCUTANEOUS | Status: DC
  Administered 2017-09-10 – 2017-09-11 (×2): 30 mg via SUBCUTANEOUS
  Filled 2017-09-10 (×2): qty 0.3

## 2017-09-10 MED ORDER — METOLAZONE 5 MG PO TABS
5.0000 mg | ORAL_TABLET | Freq: Every day | ORAL | Status: AC
  Administered 2017-09-10: 5 mg via ORAL
  Filled 2017-09-10: qty 1

## 2017-09-10 MED ORDER — MUPIROCIN CALCIUM 2 % EX CREA
TOPICAL_CREAM | Freq: Two times a day (BID) | CUTANEOUS | Status: DC
  Administered 2017-09-10: 21:00:00 via TOPICAL
  Administered 2017-09-10: 1 via TOPICAL
  Administered 2017-09-11 (×2): via TOPICAL
  Administered 2017-09-12 (×2): 1 via TOPICAL
  Administered 2017-09-13: 10:00:00 via TOPICAL
  Administered 2017-09-13: 1 via TOPICAL
  Administered 2017-09-14 – 2017-09-16 (×5): via TOPICAL
  Administered 2017-09-16: 1 via TOPICAL
  Administered 2017-09-17 (×2): via TOPICAL
  Administered 2017-09-18: 1 via TOPICAL
  Administered 2017-09-18: 10:00:00 via TOPICAL
  Administered 2017-09-19: 1 via TOPICAL
  Administered 2017-09-19 – 2017-09-20 (×2): via TOPICAL
  Administered 2017-09-20: 1 via TOPICAL
  Administered 2017-09-21: 22:00:00 via TOPICAL
  Administered 2017-09-21 – 2017-09-22 (×2): 1 via TOPICAL
  Administered 2017-09-22: 21:00:00 via TOPICAL
  Administered 2017-09-23: 1 via TOPICAL
  Administered 2017-09-23 – 2017-09-26 (×6): via TOPICAL
  Filled 2017-09-10 (×2): qty 15

## 2017-09-10 NOTE — Progress Notes (Signed)
PULMONARY / CRITICAL CARE MEDICINE   Name: Clinton Harrington MRN: [id number] DOB: Sep 05, 1951    ADMISSION DATE:  08/23/2017 CONSULTATION DATE:  08/30/17  REFERRING MD: Dr Cornelius Moras  CHIEF COMPLAINT:  Acute resp failure, hypoxia, hypercapnia  brief:  Patient is a 66 y/o Caucasian M with hx CADs/p BMS to RCA in 1999 (due to an inferior STEMI), significant PVD, HTN, Type 2 DM (non-insulin dependent), HLP, Tobacco use comes in today complaining of substernal chest pain on admission.He continued to have active CP and the patient was taken to cath lab for further intervention. FOund with RV infacrct and required emergent bypass surgery. Remained in cardiogenic shock requiring balloon pump and placement of RVAD device. Remained on vent with acute resp failure, skin closed but sternum is not closed.He has developed worsening acidosis and hypoxia with bilateral int infiltrates. He has been pos 4 liters last 2 4 hours. He remains on levophed and milrinone. CCM was asked to assist with vent management.  CULTURES: 9/22 sputum>>>ng 9/24>> Sputum>>ng 9/24 Blood>>NEG  9/24 Urine>>ng 9/28 BC x 2 >GPC , ID panel +MRSA > 10/2>> Tracheal Aspirrate  ANTIBIOTICS: zinacef >>>9/22- 9/24 ceftaz 9/22>>>9/28 Vanc>>>9/24>> Zosyn 9/28 >>  SIGNIFICANT EVENTS: 08/23/2017 - admit 9/22 - intubated Cath cabg RV assist device 9/23 - On epi/levo/RVAD/vent.  CI 1.8 despite all interventions 9/24>> Epi, Levo, milrinine,amio,insulin gtt, Lasix gtt, Heparin, Fent, Versed 9/26 - Remains sedated on fent/ versed drips RVAD flows down to 3200 Good UO on lasix gtt @ 15 9/27 - 40% fio2, peep 8. RPM on RVAD turned down. Stil on levophed, milrinone, epi ggtt and heparin.admio gtt. Making urine. Has chest draines. Good urine outoput. Heparin on hold briefly before resumptions due to bleed in groin site. Getting 2 UI PRBC 9/30 mucous plugging requiring multiple lavages 10/1 RV assist device  removed  in the OR 2/2 bleeding 1-0/2  - Stable off RVAD, remains on levo, vaso,Epi, milrinine  SUBJECTIVE/OVERNIGHT/INTERVAL HX RV assist device  Removed 10/1 secondary to bleeding. Stable 10/2 am off RVAD, remains on epi, levo,vaso and milrinine pressors.  Opens eyes with WUA, but does not follow commands. CXR  10/2 with Progressive consolidation left mid to lower lobe ?  Represent atelectasis with pleural effusion / infiltrate. Underlying pulmonary vascular congestion present. Mucus plugging with desaturations overnight.  VITAL SIGNS: BP (!) 122/54   Pulse 81   Temp 100.2 F (37.9 C)   Resp (!) 22   Ht  (1.803 m)   Wt 98.1 kg (216 lb 4.3 oz)   SpO2 95%   BMI 30.16 kg/m   HEMODYNAMICS: PAP: (27-41)/(12-26) 39/18 CVP:  [12 mmHg-19 mmHg] 18 mmHg CO:  [5.4 L/min-7.1 L/min] 7.1 L/min CI:  [2.5 L/min/m2-3.3 L/min/m2] 3.3 L/min/m2  VENTILATOR SETTINGS: Vent Mode: PRVC FiO2 (%):  [50 %-80 %] 50 % Set Rate:  [20 bmp] 20 bmp Vt Set:  [560 mL] 560 mL PEEP:  [5 cmH20] 5 cmH20 Pressure Support:  [10 cmH20] 10 cmH20 Plateau Pressure:  [18 cmH20-28 cmH20] 21 cmH20  INTAKE / OUTPUT: I/O last 3 completed shifts: In: 6042.7 [I.V.:4665.4; NG/GT:1077.3; IV Piggyback:300] Out: 6315 [Urine:5195; Emesis/NG output:500; Chest Tube:620]  PHYSICAL EXAMINATION: General:  Critically ill obese male intubates and on vent, follows no commands HEENT: Cable/AT, PERRL, oral ETT, feeding tube Neuro: Sedated, opens eyes with WUA, but does not follow commands, or focus CV: RRR, S1, S2 , sternal dressing CDI PULM: Crackles throughout, diminished per bases GI: Obese,Soft, NT, ND and +BS Extremities: warm/dry , trace  edema  Skin: Midline  sternal dressing , no rash or lesions,pale  PULMONARY  Recent Labs Lab 09/08/17 0411  09/08/17 0955 09/08/17 0958 09/08/17 1159 09/08/17 1300 09/08/17 1715 09/09/17 0355 09/09/17 0401 09/09/17 1526 09/10/17 0250 09/10/17 0325  PHART 7.416  --  7.414  --  7.360  --   --  7.403  --   --   --   7.392  PCO2ART 39.8  --  45.6  --  45.7  --   --  39.1  --   --   --  39.8  PO2ART 78.0*  --  329.0*  --  83.0  --   --  108  --   --   --  132*  HCO3 25.7  --  29.2*  --  25.9  --   --  23.9  --   --   --  23.7  TCO2 27  < > --  24  --   --  23  --   --   O2SAT 96.0  < > 100.0  --  96.0 83.5  --  98.2 72.5  --  75.3 98.8  < > = values in this interval not displayed.  CBC  Recent Labs Lab 09/09/17 0351 09/09/17 1514 09/09/17 1526 09/10/17 0232  HGB 8.7* 8.3* 8.2* 8.2*  HCT 26.2* 25.4* 24.0* 24.2*  WBC 17.1* 15.3*  --  13.1*  PLT 217 259  --  274   COAGULATION  Recent Labs Lab 09/05/17 0346 09/06/17 0500 09/07/17 0206 09/08/17 0500 09/08/17 1145  INR 1.16 1.35 1.56 1.61 1.58   CARDIAC  No results for input(s): TROPONINI in the last 168 hours. No results for input(s): PROBNP in the last 168 hours.  CHEMISTRY  Recent Labs Lab 09/05/17 0346  09/07/17 0206  09/08/17 0500  09/08/17 1410 09/08/17 1651 09/08/17 1715 09/09/17 0351 09/09/17 1514 09/09/17 1526 09/10/17 0232  NA 137  < > 135  < > 134*  < > 133*  --  134* 133*  --  131* 134*  K 3.6  < > 3.6  < > 3.9  < > 4.8  --  5.3* 4.6  --  5.7* 3.9  CL 93*  < > 99*  < > 102  < > 102  --  100* 99*  --  99* 100*  CO2 33*  < > 27  --  24  --  23  --   --  25  --   --  25  GLUCOSE 90  < > 97  < > 75  < > 87  --  83 66  --  304* 223*  BUN 55*  < > 63*  < > 64*  < > 64*  --  59* 63*  --  70* 65*  CREATININE 1.37*  < > 1.72*  < > 1.67*  < > 1.62* 1.69* 1.60* 1.63* 1.80* 1.90* 1.78*  CALCIUM 8.6*  < > 7.9*  --  7.3*  --  7.3*  --   --  7.3*  --   --  7.2*  MG 1.7  --   --   --   --   --   --  3.3*  --  3.0* 3.0*  --  2.6*  PHOS  --   --   --   --   --   --   --   --   --   --   --   --  4.3  < > = values in this interval not displayed. Estimated Creatinine Clearance: 50.7 mL/min (A) (by C-G formula based on SCr of 1.78 mg/dL (H)).  LIVER  Recent Labs Lab 09/04/17 0324 09/05/17 0346 09/06/17 0500  09/07/17 0206 09/08/17 0500 09/08/17 1145 09/10/17 0232  AST 41  --  45* 41 60*  --  86*  ALT 20  --  18 15* 18  --  26  ALKPHOS 82  --  59 83 52  --  90  BILITOT 0.7  --  0.6 0.8 0.7  --  0.7  PROT 5.3*  --  5.8* 4.9* 5.1*  --  5.7*  ALBUMIN 1.9*  --  2.2* 1.9* 1.8*  --  1.8*  INR 1.15 1.16 1.35 1.56 1.61 1.58  --    INFECTIOUS No results for input(s): LATICACIDVEN, PROCALCITON in the last 168 hours.  ENDOCRINE CBG (last 3)   Recent Labs  09/10/17 0008 09/10/17 0351 09/10/17 1132  GLUCAP 271* 222* 180*   IMAGING x48h  - image(s) personally visualized  -   highlighted in bold Dg Chest Port 1 View  Result Date: 09/10/2017 CLINICAL DATA:  Status post CABG on August 28, 2017 with removal of left ventricular assist device on September 08, 2017. EXAM: PORTABLE CHEST 1 VIEW COMPARISON:  Portable chest x-ray of September 09, 2017 FINDINGS: The lungs are reasonably well inflated. There is persistent increased interstitial density in the left mid and lower lung but this is improving. There is improved visualization of the left hemidiaphragm. The cardiac silhouette remains enlarged. The pulmonary vascularity is less engorged and more distinct. Bilateral chest tubes and the mediastinal drain are in stable position. The Swan-Ganz catheter tip projects in the main pulmonary outflow tract. The endotracheal tube tip projects approximately 3 cm above the carina. The esophagogastric tube tip and feeding tube tip project below the inferior margin of the image. The right-sided PICC line tip projects over the midportion of the SVC. IMPRESSION: Improving pulmonary interstitial edema. Decreasing left basilar atelectasis or pneumonia. No significant pleural effusion and no pneumothorax. The support tubes are in reasonable position bilaterally. Electronically Signed   By: [name]  [place] M.D.   On: 09/10/2017 07:43   Dg Chest Port 1 View  Result Date: 09/09/2017 CLINICAL DATA:  66 year old male post CABG.   Subsequent encounter. EXAM: PORTABLE CHEST 1 VIEW COMPARISON:  09/08/2017. FINDINGS: Endotracheal tube tip 6 cm above the carina. Left-sided Swan-Ganz catheter tip main pulmonary artery level. Bilateral chest tubes in place without gross pneumothorax. Post CABG.  Cardiomegaly.  Calcified tortuous aorta. Progressive consolidation left mid to lower lobe may represent atelectasis with pleural effusion although infiltrate not excluded. Component of underlying pulmonary vascular congestion. IMPRESSION: Progressive consolidation left mid to lower lobe may represent atelectasis with pleural effusion although infiltrate not excluded. Component of underlying pulmonary vascular congestion. Cardiomegaly post CABG. Aortic Atherosclerosis (ICD10-I70.0).  Calcified tortuous aorta Right central line and mediastinal drain appear to have been removed. Electronically Signed   By: Lacy Duverney M.D.   On: 09/09/2017 07:43   Dg Chest Port 1 View  Result Date: 09/08/2017 CLINICAL DATA:  Postop cardiac surgery. EXAM: PORTABLE CHEST 1 VIEW COMPARISON:  09/08/2017 at 0524 hours FINDINGS: Sequelae of CABG are again identified. Endotracheal tube terminates 4.7 cm above the carina. Enteric tube courses into the left upper abdomen with tip not imaged. Right jugular Swan-Ganz catheter has been removed. Right jugular sheath remains. Right PICC terminates over the cavoatrial junction or high right  atrium. A new left subclavian Swan-Ganz catheter terminates over the main pulmonary artery. A mediastinal drain and bilateral chest tubes remain in place. Cannulae for external ventricular assist device have been removed in the interim. The cardiac silhouette remains enlarged. Aortic atherosclerosis is noted. Patchy right upper lobe opacity is similar to the prior study. Patchy left basilar opacity is mildly improved. A small left pleural effusion is again noted. No definite residual pneumothorax is identified laterally in the left hemithorax as on  today's earlier study, although there is a small amount of curvilinear lucency in the left lung base along the hemidiaphragm. IMPRESSION: 1. Support devices with interval changes as above. 2. Possible small loculated pneumothorax in the left lung base. 3. Mildly improved left lung base aeration. Unchanged right upper lobe atelectasis or infiltrate. Electronically Signed   By: Sebastian Ache M.D.   On: 09/08/2017 12:42   Dg Abd Portable 1v  Result Date: 09/08/2017 CLINICAL DATA:  Status post feeding tube placement. EXAM: PORTABLE ABDOMEN - 1 VIEW COMPARISON:  Abdominal radiograph of August 29, 2017 FINDINGS: A radiodense tipped feeding tube is present appears to lie in the transverse portion of the duodenum. This is not as quite distally positioned as on the previous study. There is some gas within the stomach. The retrocardiac region on the left remains dense. IMPRESSION: The tip of the feeding tube lies in the transverse portion of the duodenum. Electronically Signed   By: [name]  [place] M.D.   On: 09/08/2017 15:32   ASSESSMENT / PLAN:  PULMONARY A: 09/10/2017 -> acute resp failure due to cardiogenic shock Remains pressor dependent/ sedated Not following commands on WUA Mucus plugging overnight with desaturation ? Infiltrate per CXR vs atelectasis Pulmonary edema>> will need diuresis before weaning.>> + 2.4 L last 24  P:   Continue full vent support given hemodynamics Failed weaning after 2 hours this AM, will need trach if we are to continue Continue CXR and ABG daily Titrate O2 for sat of 88-92% Continue lasix gtt>> need net negative to wean Add zaroxolyn 5 mg PO x1 Send tracheal aspirate for culture  CARDIOVASCULAR A:  09/10/2017 -> cardiogenic shock  P:  Per cards and cvts RVAD  removed 10/2 Remains Pressor dependent Maintain MAP > 65  Continue current pressor support  RENAL  Intake/Output Summary (Last 24 hours) at 09/10/17 1145 Last data filed at 09/10/17 1100  Gross per  24 hour  Intake           4797.9 ml  Output             4775 ml  Net             22.9 ml   Recent Labs Lab 09/08/17 1715 09/09/17 0351 09/09/17 1514 09/09/17 1526 09/10/17 0232  CREATININE 1.60* 1.63* 1.80* 1.90* 1.78*  A:   Acute renal failure, UOP remains as above  P:   Monitor renal function Avoid nephro toxic medications Trend UOP Replace electrolytes as needed  Trend BMET daily Add zaroxolyn 5 mg PO x1  GASTROINTESTINAL A:   Malnutrition , tolerating TF  GIB - continued drk blood in OGT , but less than over weekend P:   TF continue per nutrition PPI q12  Lovenox dose to prophylaxis since RVAD is out Monitor for worsening  HEMATOLOGIC  Recent Labs Lab 09/09/17 0351 09/09/17 1514 09/09/17 1526 09/10/17 0232  HGB 8.7* 8.3* 8.2* 8.2*  HCT 26.2* 25.4* 24.0* 24.2*  WBC 17.1* 15.3*  --  13.1*  PLT 217 259  --  274   A:   RBC: anemia criticall illness>hbg drop /GIB s/p 4 u PRBC 9/29 -9/30, HGB drop .8 gram last 24 Platelet *normal Leukocytosis  P:  Trend CBC PRBC for hgb </= 8-8.5gm%   Transfuse per ICU protocol  INFECTIOUS  A:   Possible PNA  ?MRSA Bactermia  P:   Cont on Vanc /Zosyn  Follow cx data  Send tracheal aspirate for culture Trend fever curve and WBC  ENDOCRINE A:   Hyperglycemia improved after reducing stress dose steroids P:   ICU hyperglycemia protocol Continue hydrocortisone 50 mg daily  CBG Q 4  SSI   NEUROLOGIC A:   #Current: Not following commands on WUA per nursing  P:   RASS goal: 0 to -3 Fent and versed gtt to continue Precedex to continue  FAMILY  - Updates: No family bedside 10/3.  Will discuss with CVTS, will need trach if we are to continue with more aggressive weaning.  The patient is critically ill with multiple organ systems failure and requires high complexity decision making for assessment and support, frequent evaluation and titration of therapies, application of advanced monitoring technologies and  extensive interpretation of multiple databases.   Critical Care Time devoted to patient care services described in this note is  35  Minutes. This time reflects time of care of this signee Dr Koren Bound. This critical care time does not reflect procedure time, or teaching time or supervisory time of PA/NP/Med student/Med Resident etc but could involve care discussion time.  Alyson Reedy, M.D. [facility] Pulmonary/Critical Care Medicine. Pager: [phone]. After hours pager: [phone].  09/09/2017 at [phone]

## 2017-09-10 NOTE — Social Work (Signed)
CSW reviewed chart. Patient not medically stable. CSW will continue to monitor. Pt on full vent support.   Keene Breath, LCSW Clinical Social Worker [phone]

## 2017-09-10 NOTE — Progress Notes (Signed)
Patient ID: Clinton Harrington, male   DOB: May 26, 1951, 66 y.o.   MRN: [id number]        Advanced Heart Failure Rounding Note   Subjective:    Events CABG x4 IABP Centrimag 9/20    IABP removed 9/24.  Centrimag RVAD in place DC-CV successful 9/29  Centrimag removal 10/1  Remains sedated intubated on 80% FiO2. On milrinone 0.2 mcg, norepi 16 mcg, epi 6 mcg, vasopressin 0.01, and amio 30 mg per hour. Have not made much progressive weaning vent.   I/Os even but weight down some on Lasix gtt 8 mg/hr.  Creatinine 1.9 => 1.78.   CXR (10/2) with left-sided infiltrate.  Febrile to 101 yesterday.    Swan numbers: CVP 20  PA 33/23 CI 3.4.   C-ox 75%  Echo (09/01/17): EF 65-70% with moderate LV hypertrophy, mildly dilated RV with moderately decreased systolic function, IVC dilated.   Objective:   Weight Range: 216 lb 4.3 oz (98.1 kg) Body mass index is 30.16 kg/m.   Vital Signs:   Temp:  [99.9 F (37.7 C)-101.3 F (38.5 C)] 100 F (37.8 C) (10/03 0600) Pulse Rate:  [65-89] 88 (10/03 0600) Resp:  [17-26] 21 (10/03 0600) BP: (103-140)/(39-67) 112/56 (10/03 0600) SpO2:  [90 %-100 %] 96 % (10/03 0600) Arterial Line BP: (108-146)/(40-53) 122/45 (10/03 0600) FiO2 (%):  [50 %-80 %] 80 % (10/03 0519) Weight:  [216 lb 4.3 oz (98.1 kg)] 216 lb 4.3 oz (98.1 kg) (10/03 0444) Last BM Date: 08/29/17 (reglan started and duxolax supp given)  Weight change: Filed Weights   09/08/17 0500 09/09/17 0500 09/10/17 0444  Weight: 218 lb 0.6 oz (98.9 kg) 220 lb 10.9 oz (100.1 kg) 216 lb 4.3 oz (98.1 kg)    Intake/Output:   Intake/Output Summary (Last 24 hours) at 09/10/17 0738 Last data filed at 09/10/17 0700  Gross per 24 hour  Intake          4579.47 ml  Output             4560 ml  Net            19.47 ml      Physical Exam  CVP 20 General: Sedated on vent.  Neck: JVP 14+, no thyromegaly or thyroid nodule.  Lungs: Rhonchi bilaterally.  CV: Nondisplaced PMI.  Heart regular S1/S2,  no S3/S4, no murmur.  1+ ankle edema.   Abdomen: Soft, no hepatosplenomegaly, no distention.  Skin: Sacral decubitus  Neurologic: Sedated on vent Extremities: No clubbing or cyanosis.  HEENT: Normal.    Telemetry   Personally reviewed: a-v sequential pacing.   Labs    CBC  Recent Labs  09/09/17 1514 09/09/17 1526 09/10/17 0232  WBC 15.3*  --  13.1*  HGB 8.3* 8.2* 8.2*  HCT 25.4* 24.0* 24.2*  MCV 87.9  --  89.3  PLT 259  --  053   Basic Metabolic Panel  Recent Labs  09/09/17 0351 09/09/17 1514 09/09/17 1526 09/10/17 0232  NA 133*  --  131* 134*  K 4.6  --  5.7* 3.9  CL 99*  --  99* 100*  CO2 25  --   --  25  GLUCOSE 66  --  304* 223*  BUN 63*  --  70* 65*  CREATININE 1.63* 1.80* 1.90* 1.78*  CALCIUM 7.3*  --   --  7.2*  MG 3.0* 3.0*  --  2.6*  PHOS  --   --   --  4.3  Liver Function Tests  Recent Labs  09/08/17 0500 09/10/17 0232  AST 60* 86*  ALT 18 26  ALKPHOS 52 90  BILITOT 0.7 0.7  PROT 5.1* 5.7*  ALBUMIN 1.8* 1.8*   No results for input(s): LIPASE, AMYLASE in the last 72 hours. Cardiac Enzymes No results for input(s): CKTOTAL, CKMB, CKMBINDEX, TROPONINI in the last 72 hours.  BNP: BNP (last 3 results)  Recent Labs  08/23/17 1927  BNP 200.0*    ProBNP (last 3 results) No results for input(s): PROBNP in the last 8760 hours.   D-Dimer No results for input(s): DDIMER in the last 72 hours. Hemoglobin A1C No results for input(s): HGBA1C in the last 72 hours. Fasting Lipid Panel No results for input(s): CHOL, HDL, LDLCALC, TRIG, CHOLHDL, LDLDIRECT in the last 72 hours. Thyroid Function Tests No results for input(s): TSH, T4TOTAL, T3FREE, THYROIDAB in the last 72 hours.  Invalid input(s): FREET3  Other results:   Imaging    No results found.   Medications:     Scheduled Medications: . aspirin EC  325 mg Oral Daily   Or  . aspirin  324 mg Per Tube Daily  . atorvastatin  80 mg Per Tube q1800  . bisacodyl  10 mg Oral  Daily   Or  . bisacodyl  10 mg Rectal Daily  . brimonidine  1 drop Right Eye BID  . chlorhexidine gluconate (MEDLINE KIT)  15 mL Mouth Rinse BID  . Chlorhexidine Gluconate Cloth  6 each Topical Daily  . docusate sodium  200 mg Oral Daily  . hydrocortisone sod succinate (SOLU-CORTEF) inj  50 mg Intravenous Daily  . insulin aspart  0-24 Units Subcutaneous Q4H  . insulin glargine  22 Units Subcutaneous BID  . mouth rinse  15 mL Mouth Rinse 10 times per day  . metoCLOPramide (REGLAN) injection  10 mg Intravenous Q6H  . metoprolol tartrate  12.5 mg Oral BID   Or  . metoprolol tartrate  12.5 mg Per Tube BID  . pantoprazole (PROTONIX) IV  40 mg Intravenous Q24H  . sodium chloride flush  10-40 mL Intracatheter Q12H  . sodium chloride flush  3 mL Intravenous Q12H    Infusions: . sodium chloride Stopped (09/08/17 1800)  . sodium chloride    . sodium chloride 20 mL/hr at 09/10/17 0700  . amiodarone 30 mg/hr (09/10/17 0700)  . dexmedetomidine (PRECEDEX) IV infusion 0.7 mcg/kg/hr (09/10/17 0727)  . EPINEPHrine 4 mg in dextrose 5% 250 mL infusion (16 mcg/mL) 6 mcg/min (09/10/17 0700)  . feeding supplement (VITAL 1.5 CAL) 1,000 mL (09/10/17 0700)  . fentaNYL infusion INTRAVENOUS 200 mcg/hr (09/10/17 0700)  . furosemide (LASIX) infusion 8 mg/hr (09/10/17 0700)  . lactated ringers    . lactated ringers 20 mL/hr at 09/10/17 0700  . midazolam (VERSED) infusion 2 mg/hr (09/10/17 0700)  . milrinone 0.2 mcg/kg/min (09/10/17 0700)  . norepinephrine (LEVOPHED) Adult infusion 16 mcg/min (09/10/17 0700)  . piperacillin-tazobactam (ZOSYN)  IV 3.375 g (09/10/17 0527)  . potassium chloride    . vancomycin Stopped (09/09/17 1901)  . vasopressin (PITRESSIN) infusion - *FOR SHOCK* 0.01 Units/min (09/10/17 0700)    PRN Medications: sodium chloride, acetaminophen (TYLENOL) oral liquid 160 mg/5 mL, fentaNYL, metoprolol tartrate, midazolam, ondansetron (ZOFRAN) IV, oxyCODONE, sodium chloride flush, sodium  chloride flush, traMADol    Patient Profile   Mr Silber is a 66 year old with a history of DMII, smoker, CAD S/P PCI RCA 20 years ago, HTN, PAD, and hyperlipidemia admitted with CP  and inferior MI with RV infarction.  He underwent LHC with IABP. After optimized, underwent CABG x4 centrimag for RV infarct on 9/20 and continued IABP.   Assessment/Plan   1. Post-cardiotomy cardiogenic shock: Primarily RV failure from inferior infarct with RV involvement.  Repeat echo on 9/24 showed vigorous LV systolic function with moderate RV dysfunction.  Now s/p CABG and Centrimag for RV support on 9/20. IABP removed 9/24 with good LV function.  Centrimag out 10/1.  Currently on epi 6 mcg, milrinone 0.2 mcg, norepi 16 mcg, and vasopressin 0.01.  CVP elevated to 20 today.  Better diuresis with Lasix 8 mg/hr but still I/Os even.   - Wean pressors for MAP goal 65 (had been 70).  Think we can stop vasopressin this morning then work on norepinephrine. Would try to be more aggressive with this today.  - Increase Lasix gtt to 12 mg/hr with CVP 20.  Need to clear lungs prior to extubation.  2. CAD: Inferior MI with RV involvement at presentation, now s/p CABG.   - He is getting ASA.   - Continue statin.  3. Atrial fibrillation/flutter: DCCV 9/29, now remains in NSR.  - a-paced today. Continue amio 30 mg per hour.  - Currently off anticoagulation.  Needs to restart anticoagulation when deemed stable by TCTS.   Can start with heparin gtt but would transition to Eliquis eventually.   4. AKI: Creatinine 1.78 down from 1.9 yesterday.  Watch closely with diuresis.  5. Diabetes: Difficult to control, appreciate help from diabetes coordinator. No change.  6. Hyponatremia: Improved.    7. ID: Coag negative Staph on 9/28 blood culture, possible HCAP versus contaminant. Febrile to 101 yesterday.  - BAL sent, will re-culture blood.  - Covering for HCAP with vancomycin/Zosyn => LLL infiltrate on CXR.  8. Anemia: Received 2  uPRBCs 09/04/17. 4 units PRBCs on 9/29. Received 2U PRBCs 10/1.  Hgb stable.  9. Coffee grounds in NGT- resolved.  - Continue IV protonix 10. L Buttock - Suspected Deep Tissue Injury Pressure Ulcer . This may evolve to full thickness tissue loss. Continue foam dressing and reposition R-L every 2 hours. Consult WOC.  11. R Buttock- Stage 2 Pressure Ulcer. Continue foam dressing and reposition R-L every 2 hours.   CRITICAL CARE Performed by: Loralie Champagne  Total critical care time: 35 minutes  Critical care time was exclusive of separately billable procedures and treating other patients.  Critical care was necessary to treat or prevent imminent or life-threatening deterioration.  Critical care was time spent personally by me on the following activities: development of treatment plan with patient and/or surrogate as well as nursing, discussions with consultants, evaluation of patient's response to treatment, examination of patient, obtaining history from patient or surrogate, ordering and performing treatments and interventions, ordering and review of laboratory studies, ordering and review of radiographic studies, pulse oximetry and re-evaluation of patient's condition.  Loralie Champagne, MD  09/10/2017, 7:38 AM  Advanced Heart Failure Team Pager [phone] (M-F; 7a - 4p)  Please contact [facility] for night-coverage after hours (4p -7a ) and weekends on [email or web address]

## 2017-09-10 NOTE — Progress Notes (Addendum)
2 Days Post-Op Procedure(s) (LRB): REMOVAL OF R-VENTRICULAR ASSIST DEVICE WITH CARDIOPULMONARY BYPASS (N/A) TRANSESOPHAGEAL ECHOCARDIOGRAM (TEE) (N/A) Subjective: Stable night Blood cultures 10-1 >>> + MRSA. All lines changed in OR- will change a-line today, DC old PICC line On vanc/Zosyn CXR better with diuresis nsr Hb stable on 8.2 -- start lovenox PS vent weaning but not ready to extrubate Objective: Vital signs in last 24 hours: Temp:  [99.9 F (37.7 C)-101.3 F (38.5 C)] 100 F (37.8 C) (10/03 0743) Pulse Rate:  [65-89] 88 (10/03 0735) Cardiac Rhythm: A-V Sequential paced (10/03 0800) Resp:  [17-26] 24 (10/03 0735) BP: (103-140)/(39-69) 133/69 (10/03 0743) SpO2:  [90 %-100 %] 97 % (10/03 0735) Arterial Line BP: (108-161)/(40-55) 161/55 (10/03 0735) FiO2 (%):  [60 %-80 %] 60 % (10/03 0800) Weight:  [216 lb 4.3 oz (98.1 kg)] 216 lb 4.3 oz (98.1 kg) (10/03 0444)  Hemodynamic parameters for last 24 hours: PAP: (23-41)/(11-26) 38/20 CVP:  [10 mmHg-20 mmHg] 18 mmHg CO:  [5.4 L/min-7.1 L/min] 7.1 L/min CI:  [2.5 L/min/m2-3.3 L/min/m2] 3.3 L/min/m2  Intake/Output from previous day: 10/02 0701 - 10/03 0700 In: 4579.5 [I.V.:3212.1; NG/GT:1067.3; IV Piggyback:300] Out: 4560 [Urine:3640; Emesis/NG output:500; Chest Tube:420] Intake/Output this shift: Total I/O In: 193.1 [I.V.:93.1; IV Piggyback:100] Out: -        Exam    General- alert and comfortable   Lungs- clear without rales, wheezes   Cor- regular rate and rhythm, no murmur , gallop   Abdomen- soft, non-tender   Extremities - warm, non-tender, minimal edema   Neuro- oriented, appropriate, no focal weakness   Lab Results:  Recent Labs  09/09/17 1514 09/09/17 1526 09/10/17 0232  WBC 15.3*  --  13.1*  HGB 8.3* 8.2* 8.2*  HCT 25.4* 24.0* 24.2*  PLT 259  --  274   BMET:  Recent Labs  09/09/17 0351  09/09/17 1526 09/10/17 0232  NA 133*  --  131* 134*  K 4.6  --  5.7* 3.9  CL 99*  --  99* 100*  CO2 25   --   --  25  GLUCOSE 66  --  304* 223*  BUN 63*  --  70* 65*  CREATININE 1.63*  < > 1.90* 1.78*  CALCIUM 7.3*  --   --  7.2*  < > = values in this interval not displayed.  PT/INR:  Recent Labs  09/08/17 1145  LABPROT 18.7*  INR 1.58   ABG    Component Value Date/Time   PHART 7.392 09/10/2017 0325   HCO3 23.7 09/10/2017 0325   TCO2 23 09/09/2017 1526   ACIDBASEDEF 0.5 09/10/2017 0325   O2SAT 98.8 09/10/2017 0325   CBG (last 3)   Recent Labs  09/09/17 1931 09/10/17 0008 09/10/17 0351  GLUCAP 348* 271* 222*    Assessment/Plan: S/P Procedure(s) (LRB): REMOVAL OF R-VENTRICULAR ASSIST DEVICE WITH CARDIOPULMONARY BYPASS (N/A) TRANSESOPHAGEAL ECHOCARDIOGRAM (TEE) (N/A) Diuresis Diabetes control Continue foley due to urinary output monitoring vancomycin for MRSA bacteremia   LOS: 18 days    Clinton Harrington 09/10/2017

## 2017-09-10 NOTE — Procedures (Signed)
Arterial Catheter Insertion Procedure Note Clinton Harrington [id number] Jul 28, 1951  Procedure: Insertion of Arterial Catheter  Indications: Blood pressure monitoring and Frequent blood sampling  Procedure Details Consent: aline replace Time Out: Verified patient identification, verified procedure, site/side was marked, verified correct patient position, special equipment/implants available, medications/allergies/relevent history reviewed, required imaging and test results available.  Performed  Maximum sterile technique was used including antiseptics, cap, gloves, gown, hand hygiene, mask and sheet. Skin prep: Chlorhexidine; local anesthetic administered 20 gauge catheter was inserted into right radial artery using the Seldinger technique.  Evaluation Blood flow good; BP tracing good. Complications: No apparent complications.   Lurlean Leyden 09/10/2017

## 2017-09-10 NOTE — Progress Notes (Signed)
Patient ID: Clinton Harrington, male   DOB: 25-Oct-1951, 66 y.o.   MRN: [id number]  SICU Evening Rounds:   Hemodynamically stable  CI = 2.8 on milrinone 0.2 and epi 5. Levophed is off  Remains on vent but weaned a couple hours today before RR increased.  Urine output good on lasix drip 12. CT output low  CBC    Component Value Date/Time   WBC 13.1 (H) 09/10/2017 0232   RBC 2.71 (L) 09/10/2017 0232   HGB 7.8 (L) 09/10/2017 1553   HCT 23.0 (L) 09/10/2017 1553   PLT 274 09/10/2017 0232   MCV 89.3 09/10/2017 0232   MCH 30.3 09/10/2017 0232   MCHC 33.9 09/10/2017 0232   RDW 15.6 (H) 09/10/2017 0232   LYMPHSABS 2.6 08/23/2017 1724   MONOABS 1.0 08/23/2017 1724   EOSABS 0.0 08/23/2017 1724   BASOSABS 0.0 08/23/2017 1724     BMET    Component Value Date/Time   NA 135 09/10/2017 1553   K 5.0 09/10/2017 1553   CL 98 (L) 09/10/2017 1553   CO2 25 09/10/2017 0232   GLUCOSE 333 (H) 09/10/2017 1553   BUN 69 (H) 09/10/2017 1553   CREATININE 1.80 (H) 09/10/2017 1553   CALCIUM 7.2 (L) 09/10/2017 0232   GFRNONAA 39 (L) 09/10/2017 0232   GFRAA 45 (L) 09/10/2017 0232     A/P:  Stable day today.  Continue current plans. Hgb is 7.8 this pm. May require transfusion but will repeat in am.

## 2017-09-10 NOTE — Progress Notes (Deleted)
error 

## 2017-09-11 ENCOUNTER — Other Ambulatory Visit: Payer: Self-pay | Admitting: Vascular Surgery

## 2017-09-11 ENCOUNTER — Inpatient Hospital Stay (HOSPITAL_COMMUNITY): Payer: PPO

## 2017-09-11 DIAGNOSIS — J810 Acute pulmonary edema: Secondary | ICD-10-CM

## 2017-09-11 LAB — TYPE AND SCREEN
ABO/RH(D): O POS
Antibody Screen: NEGATIVE
Unit division: 0

## 2017-09-11 LAB — COMPREHENSIVE METABOLIC PANEL
ALT: 27 U/L (ref 17–63)
AST: 74 U/L — ABNORMAL HIGH (ref 15–41)
Albumin: 2.4 g/dL — ABNORMAL LOW (ref 3.5–5.0)
Alkaline Phosphatase: 87 U/L (ref 38–126)
Anion gap: 9 (ref 5–15)
BUN: 65 mg/dL — ABNORMAL HIGH (ref 6–20)
CO2: 29 mmol/L (ref 22–32)
Calcium: 7.6 mg/dL — ABNORMAL LOW (ref 8.9–10.3)
Chloride: 99 mmol/L — ABNORMAL LOW (ref 101–111)
Creatinine, Ser: 1.73 mg/dL — ABNORMAL HIGH (ref 0.61–1.24)
GFR calc Af Amer: 47 mL/min — ABNORMAL LOW (ref >60)
GFR calc non Af Amer: 40 mL/min — ABNORMAL LOW (ref >60)
Glucose, Bld: 183 mg/dL — ABNORMAL HIGH (ref 65–99)
Potassium: 3.2 mmol/L — ABNORMAL LOW (ref 3.5–5.1)
Sodium: 137 mmol/L (ref 135–145)
Total Bilirubin: 0.9 mg/dL (ref 0.3–1.2)
Total Protein: 6.3 g/dL — ABNORMAL LOW (ref 6.5–8.1)

## 2017-09-11 LAB — BLOOD GAS, ARTERIAL
Acid-Base Excess: 3.4 mmol/L — ABNORMAL HIGH (ref 0.0–2.0)
Bicarbonate: 27.5 mmol/L (ref 20.0–28.0)
Drawn by: 252031
FIO2: 50
MECHVT: 560 mL
O2 Saturation: 97.5 %
PEEP: 5 cmH2O
Patient temperature: 98.6
RATE: 20 resp/min
pCO2 arterial: 42.5 mmHg (ref 32.0–48.0)
pH, Arterial: 7.427 (ref 7.350–7.450)
pO2, Arterial: 94 mmHg (ref 83.0–108.0)

## 2017-09-11 LAB — POCT I-STAT, CHEM 8
BUN: 68 mg/dL — AB (ref 6–20)
CREATININE: 1.7 mg/dL — AB (ref 0.61–1.24)
Calcium, Ion: 1.09 mmol/L — ABNORMAL LOW (ref 1.15–1.40)
Chloride: 100 mmol/L — ABNORMAL LOW (ref 101–111)
Glucose, Bld: 215 mg/dL — ABNORMAL HIGH (ref 65–99)
HEMATOCRIT: 28 % — AB (ref 39.0–52.0)
Hemoglobin: 9.5 g/dL — ABNORMAL LOW (ref 13.0–17.0)
POTASSIUM: 4.9 mmol/L (ref 3.5–5.1)
Sodium: 138 mmol/L (ref 135–145)
TCO2: 26 mmol/L (ref 22–32)

## 2017-09-11 LAB — COOXEMETRY PANEL
Carboxyhemoglobin: 0.8 % (ref 0.5–1.5)
Methemoglobin: 0.8 % (ref 0.0–1.5)
O2 Saturation: 73.4 %
Total hemoglobin: 14 g/dL (ref 12.0–16.0)

## 2017-09-11 LAB — GLUCOSE, CAPILLARY
GLUCOSE-CAPILLARY: 157 mg/dL — AB (ref 65–99)
GLUCOSE-CAPILLARY: 206 mg/dL — AB (ref 65–99)
Glucose-Capillary: 169 mg/dL — ABNORMAL HIGH (ref 65–99)
Glucose-Capillary: 152 mg/dL — ABNORMAL HIGH (ref 65–99)
Glucose-Capillary: 196 mg/dL — ABNORMAL HIGH (ref 65–99)
Glucose-Capillary: 263 mg/dL — ABNORMAL HIGH (ref 65–99)

## 2017-09-11 LAB — CULTURE, RESPIRATORY W GRAM STAIN: Special Requests: NORMAL

## 2017-09-11 LAB — BPAM RBC
Blood Product Expiration Date: 201810252359
ISSUE DATE / TIME: 201810031955
Unit Type and Rh: 5100

## 2017-09-11 LAB — CBC
HCT: 26.4 % — ABNORMAL LOW (ref 39.0–52.0)
Hemoglobin: 8.5 g/dL — ABNORMAL LOW (ref 13.0–17.0)
MCH: 28.8 pg (ref 26.0–34.0)
MCHC: 32.2 g/dL (ref 30.0–36.0)
MCV: 89.5 fL (ref 78.0–100.0)
Platelets: 301 10*3/uL (ref 150–400)
RBC: 2.95 MIL/uL — ABNORMAL LOW (ref 4.22–5.81)
RDW: 15.3 % (ref 11.5–15.5)
WBC: 12.3 10*3/uL — ABNORMAL HIGH (ref 4.0–10.5)

## 2017-09-11 LAB — VANCOMYCIN, TROUGH: Vancomycin Tr: 24 ug/mL (ref 15–20)

## 2017-09-11 LAB — HEPARIN LEVEL (UNFRACTIONATED): Heparin Unfractionated: 0.27 IU/mL — ABNORMAL LOW (ref 0.30–0.70)

## 2017-09-11 MED ORDER — HEPARIN (PORCINE) IN NACL 100-0.45 UNIT/ML-% IJ SOLN: 900.0000 [IU]/h | INTRAMUSCULAR | Status: DC

## 2017-09-11 MED ORDER — AMIODARONE IV BOLUS ONLY 150 MG/100ML
150.0000 mg | Freq: Once | INTRAVENOUS | Status: AC
  Administered 2017-09-11: 150 mg via INTRAVENOUS

## 2017-09-11 MED ORDER — AMIODARONE LOAD VIA INFUSION
150.0000 mg | Freq: Once | INTRAVENOUS | Status: AC
  Administered 2017-09-11: 150 mg via INTRAVENOUS

## 2017-09-11 MED ORDER — VANCOMYCIN HCL 10 G IV SOLR
1250.0000 mg | INTRAVENOUS | Status: AC
  Administered 2017-09-11 – 2017-09-12 (×2): 1250 mg via INTRAVENOUS
  Filled 2017-09-11 (×2): qty 1250

## 2017-09-11 MED ORDER — POTASSIUM CHLORIDE 20 MEQ/15ML (10%) PO SOLN
40.0000 meq | Freq: Three times a day (TID) | ORAL | Status: AC
Start: 2017-09-11 — End: 2017-09-11
  Administered 2017-09-11 (×2): 40 meq
  Filled 2017-09-11 (×2): qty 30

## 2017-09-11 MED ORDER — HEPARIN (PORCINE) IN NACL 100-0.45 UNIT/ML-% IJ SOLN
INTRAMUSCULAR | Status: AC
  Filled 2017-09-11: qty 250

## 2017-09-11 MED ORDER — POTASSIUM CHLORIDE 10 MEQ/50ML IV SOLN
10.0000 meq | INTRAVENOUS | Status: AC
  Administered 2017-09-11 (×3): 10 meq via INTRAVENOUS
  Filled 2017-09-11 (×4): qty 50

## 2017-09-11 MED ORDER — SPIRONOLACTONE 25 MG PO TABS
12.5000 mg | ORAL_TABLET | Freq: Every day | ORAL | Status: DC
  Administered 2017-09-11: 12.5 mg
  Filled 2017-09-11: qty 1

## 2017-09-11 MED ORDER — POTASSIUM CHLORIDE 10 MEQ/50ML IV SOLN
10.0000 meq | INTRAVENOUS | Status: AC
  Administered 2017-09-11 (×2): 10 meq via INTRAVENOUS
  Filled 2017-09-11 (×2): qty 50

## 2017-09-11 MED ORDER — HEPARIN (PORCINE) IN NACL 100-0.45 UNIT/ML-% IJ SOLN
1300.0000 [IU]/h | INTRAMUSCULAR | Status: DC
  Administered 2017-09-11: 1100 [IU]/h via INTRAVENOUS
  Administered 2017-09-12: 1200 [IU]/h via INTRAVENOUS
  Administered 2017-09-13: 1450 [IU]/h via INTRAVENOUS
  Administered 2017-09-14: 1500 [IU]/h via INTRAVENOUS
  Administered 2017-09-14: 1600 [IU]/h via INTRAVENOUS
  Administered 2017-09-15: 1500 [IU]/h via INTRAVENOUS
  Filled 2017-09-11 (×5): qty 250

## 2017-09-11 MED ORDER — SPIRONOLACTONE 5 MG/ML ORAL SUSPENSION
12.5000 mg | Freq: Every day | ORAL | Status: DC
  Filled 2017-09-11: qty 2.5

## 2017-09-11 MED ORDER — METOLAZONE 5 MG PO TABS
5.0000 mg | ORAL_TABLET | Freq: Every day | ORAL | Status: AC
  Administered 2017-09-11: 5 mg via ORAL
  Filled 2017-09-11: qty 1

## 2017-09-11 NOTE — Progress Notes (Addendum)
This note also relates to the following rows which could not be included: SpO2 - Cannot attach notes to unvalidated device data  Patient placed back on full support due to work of breathing and desating. Vitals are stable. RT will continue to monitor.

## 2017-09-11 NOTE — Progress Notes (Signed)
2nd Amio bolus given per MD Donata Clay

## 2017-09-11 NOTE — Progress Notes (Signed)
Patient ID: Clinton Harrington, male   DOB: 21-Aug-1951, 66 y.o.   MRN: [id number] EVENING ROUNDS NOTE :     [address]       [place] [address]             [phone]                 3 Days Post-Op Procedure(s) (LRB): REMOVAL OF R-VENTRICULAR ASSIST DEVICE WITH CARDIOPULMONARY BYPASS (N/A) TRANSESOPHAGEAL ECHOCARDIOGRAM (TEE) (N/A)  Total Length of Stay:  LOS: 19 days  BP (!) 91/57   Pulse (!) 101   Temp 99 F (37.2 C)   Resp 20   Ht  (1.803 m)   Wt 207 lb 7.3 oz (94.1 kg)   SpO2 95%   BMI 28.93 kg/m   .Intake/Output      10/03 0701 - 10/04 0700 10/04 0701 - 10/05 0700   I.V. (mL/kg) 2277.1 (24.2) 962.5 (10.2)   Blood 330    NG/GT 719.5 1200   IV Piggyback 600 400   Total Intake(mL/kg) 3926.6 (41.7) 2562.5 (27.2)   Urine (mL/kg/hr) 5200 (2.3) 2825 (2.5)   Emesis/NG output 1200 950   Other 100    Chest Tube 220 50   Total Output 6720 3825   Net -2793.4 -1262.5        Stool Occurrence  2 x   Emesis Occurrence  1 x     . sodium chloride    . sodium chloride 10 mL/hr at 09/11/17 0800  . amiodarone 30 mg/hr (09/11/17 0800)  . dexmedetomidine (PRECEDEX) IV infusion 0.7 mcg/kg/hr (09/11/17 1523)  . EPINEPHrine 4 mg in dextrose 5% 250 mL infusion (16 mcg/mL) 1 mcg/min (09/11/17 1200)  . feeding supplement (VITAL 1.5 CAL) 1,000 mL (09/11/17 1027)  . fentaNYL infusion INTRAVENOUS 100 mcg/hr (09/11/17 1735)  . fluconazole (DIFLUCAN) IV Stopped (09/11/17 0959)  . furosemide (LASIX) infusion 12 mg/hr (09/11/17 0800)  . heparin 1,100 Units/hr (09/11/17 1620)  . lactated ringers 10 mL/hr at 09/11/17 0800  . midazolam (VERSED) infusion Stopped (09/11/17 0657)  . milrinone 0.2 mcg/kg/min (09/11/17 0800)  . norepinephrine (LEVOPHED) Adult infusion Stopped (09/10/17 1837)  . piperacillin-tazobactam (ZOSYN)  IV Stopped (09/11/17 1709)  . vancomycin Stopped (09/10/17 1918)     Lab Results  Component Value Date   WBC 12.3 (H) 09/11/2017   HGB 9.5  (L) 09/11/2017   HCT 28.0 (L) 09/11/2017   PLT 301 09/11/2017   GLUCOSE 215 (H) 09/11/2017   CHOL 126 08/24/2017   TRIG 240 (H) 08/24/2017   HDL 31 (L) 08/24/2017   LDLDIRECT 128.6 10/30/2012   LDLCALC 47 08/24/2017   ALT 27 09/11/2017   AST 74 (H) 09/11/2017   NA 138 09/11/2017   K 4.9 09/11/2017   CL 100 (L) 09/11/2017   CREATININE 1.70 (H) 09/11/2017   BUN 68 (H) 09/11/2017   CO2 29 09/11/2017   TSH 0.722 08/25/2017   INR 1.58 09/08/2017   HGBA1C 5.9 (H) 08/25/2017   MICROALBUR 30.9 (H) 10/30/2012   Back on heparin drip , in a fib "blank stare" ? Cortical blindness  So far no head scan Moves right side and left foot but no left arm  [name]  Beeper [phone] Office [phone] 09/11/2017 6:48 PM

## 2017-09-11 NOTE — Progress Notes (Signed)
Patient ID: Clinton Harrington, male   DOB: 01-Nov-1951, 66 y.o.   MRN: [id number]        Advanced Heart Failure Rounding Note   Subjective:    Events CABG x4 IABP Centrimag 9/20    IABP removed 9/24.  Centrimag RVAD in place DC-CV successful 9/29  Centrimag removal 10/1   On milrinone 0.2 mcg and 2 mcg epi. Improved diuresis with lasix drip at 12 mg per hour. Afebrile. On vent 50%.   Blood CX pending. 09/10/2017   [place]wan numbers: CVP 16 PA 29/15 CO/CI   6/2.8 CO-OX 73%.   Echo (09/01/17): EF 65-70% with moderate LV hypertrophy, mildly dilated RV with moderately decreased systolic function, IVC dilated.   Objective:   Weight Range: 207 lb 7.3 oz (94.1 kg) Body mass index is 28.93 kg/m.   Vital Signs:   Temp:  [98.6 F (37 C)-100.6 F (38.1 C)] 98.6 F (37 C) (10/04 0600) Pulse Rate:  [78-88] 78 (10/04 0600) Resp:  [18-26] 18 (10/04 0600) BP: (113-140)/(52-69) 119/56 (10/04 0600) SpO2:  [92 %-98 %] 96 % (10/04 0600) Arterial Line BP: (109-176)/(40-55) 135/46 (10/04 0600) FiO2 (%):  [50 %-60 %] 50 % (10/04 0353) Weight:  [207 lb 7.3 oz (94.1 kg)] 207 lb 7.3 oz (94.1 kg) (10/04 0600) Last BM Date: 08/29/17  Weight change: Filed Weights   09/09/17 0500 09/10/17 0444 09/11/17 0600  Weight: 220 lb 10.9 oz (100.1 kg) 216 lb 4.3 oz (98.1 kg) 207 lb 7.3 oz (94.1 kg)    Intake/Output:   Intake/Output Summary (Last 24 hours) at 09/11/17 0703 Last data filed at 09/11/17 0700  Gross per 24 hour  Intake           3926.6 ml  Output             6720 ml  Net          -2793.4 ml      Physical Exam  CVP 16 General:  Intubated.  HEENT: ETT Neck: supple. JVP to jaw. Carotids 2+ bilat; no bruits. No lymphadenopathy or thryomegaly appreciated. Cor: PMI nondisplaced. Regular rate & rhythm. No rubs, gallops or murmurs. Sternal incision  Lungs: Rhonchi throughout  Abdomen: soft, nontender, distended. No hepatosplenomegaly. No bruits or masses. Good bowel sounds. Extremities:  no cyanosis, clubbing, rash, edema. R and LLE SCDs R radial A line  Neuro: intubated. Follows commands. MAE Skin: Foam dressing on buttocks.     Telemetry   Personally reviewed: a-v sequential pacing.   Labs    CBC  Recent Labs  09/10/17 0232 09/10/17 1553 09/11/17 0337  WBC 13.1*  --  12.3*  HGB 8.2* 7.8* 8.5*  HCT 24.2* 23.0* 26.4*  MCV 89.3  --  89.5  PLT 274  --  366   Basic Metabolic Panel  Recent Labs  09/09/17 1514  09/10/17 0232 09/10/17 1553 09/11/17 0337  NA  --   < > 134* 135 137  K  --   < > 3.9 5.0 3.2*  CL  --   < > 100* 98* 99*  CO2  --   --  25  --  29  GLUCOSE  --   < > 223* 333* 183*  BUN  --   < > 65* 69* 65*  CREATININE 1.80*  < > 1.78* 1.80* 1.73*  CALCIUM  --   --  7.2*  --  7.6*  MG 3.0*  --  2.6*  --   --   PHOS  --   --  4.3  --   --   < > = values in this interval not displayed. Liver Function Tests  Recent Labs  09/10/17 0232 09/11/17 0337  AST 86* 74*  ALT 26 27  ALKPHOS 90 87  BILITOT 0.7 0.9  PROT 5.7* 6.3*  ALBUMIN 1.8* 2.4*   No results for input(s): LIPASE, AMYLASE in the last 72 hours. Cardiac Enzymes No results for input(s): CKTOTAL, CKMB, CKMBINDEX, TROPONINI in the last 72 hours.  BNP: BNP (last 3 results)  Recent Labs  08/23/17 1927  BNP 200.0*    ProBNP (last 3 results) No results for input(s): PROBNP in the last 8760 hours.   D-Dimer No results for input(s): DDIMER in the last 72 hours. Hemoglobin A1C No results for input(s): HGBA1C in the last 72 hours. Fasting Lipid Panel No results for input(s): CHOL, HDL, LDLCALC, TRIG, CHOLHDL, LDLDIRECT in the last 72 hours. Thyroid Function Tests No results for input(s): TSH, T4TOTAL, T3FREE, THYROIDAB in the last 72 hours.  Invalid input(s): FREET3  Other results:   Imaging    No results found.   Medications:     Scheduled Medications: . aspirin EC  325 mg Oral Daily   Or  . aspirin  324 mg Per Tube Daily  . atorvastatin  80 mg Per Tube  q1800  . bisacodyl  10 mg Oral Daily   Or  . bisacodyl  10 mg Rectal Daily  . brimonidine  1 drop Right Eye BID  . chlorhexidine gluconate (MEDLINE KIT)  15 mL Mouth Rinse BID  . Chlorhexidine Gluconate Cloth  6 each Topical Daily  . docusate sodium  200 mg Oral Daily  . enoxaparin (LOVENOX) injection  30 mg Subcutaneous Q24H  . hydrocortisone sod succinate (SOLU-CORTEF) inj  50 mg Intravenous Daily  . insulin aspart  0-24 Units Subcutaneous Q4H  . insulin glargine  30 Units Subcutaneous BID  . mouth rinse  15 mL Mouth Rinse 10 times per day  . metoCLOPramide (REGLAN) injection  10 mg Intravenous Q6H  . metoprolol tartrate  12.5 mg Oral BID   Or  . metoprolol tartrate  12.5 mg Per Tube BID  . mupirocin cream   Topical BID  . pantoprazole (PROTONIX) IV  40 mg Intravenous Q24H  . sodium chloride flush  10-40 mL Intracatheter Q12H  . sodium chloride flush  3 mL Intravenous Q12H    Infusions: . sodium chloride    . sodium chloride 10 mL/hr at 09/11/17 0700  . amiodarone 30 mg/hr (09/11/17 0700)  . dexmedetomidine (PRECEDEX) IV infusion 0.7 mcg/kg/hr (09/11/17 0700)  . EPINEPHrine 4 mg in dextrose 5% 250 mL infusion (16 mcg/mL) 2 mcg/min (09/11/17 0700)  . feeding supplement (VITAL 1.5 CAL) 1,000 mL (09/10/17 1015)  . fentaNYL infusion INTRAVENOUS 200 mcg/hr (09/11/17 0700)  . fluconazole (DIFLUCAN) IV Stopped (09/10/17 1030)  . furosemide (LASIX) infusion 12 mg/hr (09/11/17 0700)  . lactated ringers 10 mL/hr at 09/11/17 0700  . midazolam (VERSED) infusion Stopped (09/11/17 0657)  . milrinone 0.2 mcg/kg/min (09/11/17 0700)  . norepinephrine (LEVOPHED) Adult infusion Stopped (09/10/17 1837)  . piperacillin-tazobactam (ZOSYN)  IV 3.375 g (09/11/17 0540)  . potassium chloride 10 mEq (09/11/17 0657)  . vancomycin Stopped (09/10/17 1918)  . vasopressin (PITRESSIN) infusion - *FOR SHOCK* Stopped (09/10/17 0747)    PRN Medications: acetaminophen (TYLENOL) oral liquid 160 mg/5 mL,  fentaNYL, metoprolol tartrate, midazolam, ondansetron (ZOFRAN) IV, oxyCODONE, sodium chloride flush, sodium chloride flush, traMADol    Patient Profile  Clinton Harrington is a 66 year old with a history of DMII, smoker, CAD S/P PCI RCA 20 years ago, HTN, PAD, and hyperlipidemia admitted with CP and inferior MI with RV infarction.  He underwent LHC with IABP. After optimized, underwent CABG x4 centrimag for RV infarct on 9/20 and continued IABP.   Assessment/Plan   1. Post-cardiotomy cardiogenic shock: Primarily RV failure from inferior infarct with RV involvement.  Repeat echo on 9/24 showed vigorous LV systolic function with moderate RV dysfunction.  Now s/p CABG and Centrimag for RV support on 9/20. IABP removed 9/24 with good LV function.  Centrimag out 10/1.  Currently on epi 2 mcg, milrinone 0.2 mcg. Off norepi and vasopressin. CO-OX 73%.  - Wean epinephrine today, continue milrinone.  - Volume status improving. Continue lasix 12 mg per hour.   - K 3.2. Add 12.5 mg spiro daily.  2. CAD: Inferior MI with RV involvement at presentation, now s/p CABG.   - He is getting ASA.   - Continue statin.  3. Atrial fibrillation/flutter: DCCV 9/29, now remains in NSR.  - NSR with PACs today. Continue amio 30 mg per hour.  - Currently only getting prophylactic Lovenox.  Needs to restart full anticoagulation when deemed stable by TCTS (had DCCV this admission from atrial fibrillation).   Can start with heparin gtt but would transition to Eliquis eventually.   4. AKI: Renal function unchanged. Watch closely with diuresis.  5. Diabetes: Difficult to control, appreciate help from diabetes coordinator. No change.  6. Hyponatremia: Resolved     7. ID: Coag negative Staph on 9/28 blood culture, possible HCAP versus contaminant.  Afebrile. WBC trending down.  - Blood CX 10/3- NGTD BAL sent, will re-culture blood.  - Covering for HCAP with vancomycin/Zosyn => LLL infiltrate on CXR.  8. Anemia: Received 2 uPRBCs  09/04/17. 4 units PRBCs on 9/29. Received 2U PRBCs 10/1.  Hgb 8.5.   9. Coffee grounds in NGT- resolved.  - Continue IV protonix 10. L Buttock - Suspected Deep Tissue Injury Pressure Ulcer . This may evolve to full thickness tissue loss. Continue foam dressing and reposition R-L every 2 hours. Consult WOC.  11. R Buttock- Stage 2 Pressure Ulcer. Continue foam dressing and reposition R-L every 2 hours.   Amy Clegg NP-C  7:04 AM   Patient seen with NP, agree with the above note.  He had a good day yesterday, diuresed well with stable renal function and was able to wean off vasopressin and norepinephrine.  CVP 16 today.  - Continue Lasix at 12 mg/hr today.  - Wean off epinephrine today.   He is now on prophylactic Lovenox.  Needs full anticoagulation (post DCCV for atrial fibrillation) when deemed stable for this by TCTS.   Loralie Champagne 09/11/2017 7:45 AM

## 2017-09-11 NOTE — Plan of Care (Signed)
Problem: Respiratory: Goal: Ability to tolerate decreased levels of ventilator support will improve Outcome: Progressing Pt tolerated 6hrs weaning during day shift today 10/4.

## 2017-09-11 NOTE — Progress Notes (Signed)
ANTICOAGULATION CONSULT NOTE - Initial Consult  Pharmacy Consult for heparin Indication: atrial fibrillation  Allergies  Allergen Reactions  . Aspirin Other (See Comments)    Stomach upset  . Latex Rash  . Oxycodone-Acetaminophen Itching    Patient Measurements: Height:  (180.3 cm) Weight: 207 lb 7.3 oz (94.1 kg) IBW/kg (Calculated) : 75.3 Heparin Dosing Weight: 94.1kg   Vital Signs: Temp: 99.7 F (37.6 C) (10/04 2300) Temp Source: Core (Comment) (10/04 2000) BP: 88/50 (10/04 2300) Pulse Rate: 73 (10/04 2300)  Labs:  Recent Labs  09/09/17 1514  09/10/17 0232 09/10/17 1553 09/11/17 0337 09/11/17 1600 09/11/17 2240  HGB 8.3*  < > 8.2* 7.8* 8.5* 9.5*  --   HCT 25.4*  < > 24.2* 23.0* 26.4* 28.0*  --   PLT 259  --  274  --  301  --   --   HEPARINUNFRC  --   --   --   --   --   --  0.27*  CREATININE 1.80*  < > 1.78* 1.80* 1.73* 1.70*  --   < > = values in this interval not displayed.  Estimated Creatinine Clearance: 52.1 mL/min (A) (by C-G formula based on SCr of 1.7 mg/dL (H)).    Assessment: 60 YOM who is being followed by Heart Failure team, s/p IABP removal and Centrimag, now with AFib to start heparin- aiming for lower, more narrow goal.  Hep lvl 0.27 on 1100 units/hr  Goal of Therapy:  Heparin level 0.25-0.35 units/ml Monitor platelets by anticoagulation protocol: Yes   Plan:  Continue heparin 1100 units/hr Daily heparin level and CBC  Isaac Bliss, PharmD, BCPS, BCCCP Clinical Pharmacist Clinical phone for 09/11/2017 from 7a-3:30p: [phone] If after 3:30p, please call main pharmacy at: [phone] 09/11/2017 11:22 PM

## 2017-09-11 NOTE — Progress Notes (Signed)
Wasted 25 ml of Versed at this time with Ann Maki, RN. Versed was expired.

## 2017-09-11 NOTE — Progress Notes (Signed)
CRITICAL VALUE ALERT  Critical Value:  Vanc 24  Date & Time Notied:  09/11/2017 1833  Vanc antibiotics not given yet today.

## 2017-09-11 NOTE — Progress Notes (Signed)
Pharmacy Antibiotic Note  Clinton Harrington is a 66 y.o. male admitted on 08/23/2017 with infection prophylaxis post-centrimag placement. Pharmacy has been consulted for vancomycin dosing and is also on Zosyn per MD. Pt now post RVAD removal with concern for opacity on CXR. WBC elevated but is trending down post/op, tmax/24h 100.6  Surgeon ordered vancomycin trough tonight. RN called as it resulted at 15mcg/mL.   Plan: -Reduce vancomycin to  IV q24h -Continue Zosyn 3.375g IV q8h -Monitor VT as indicated, renal function, LOT, cultures  Height:  (180.3 cm) Weight: 207 lb 7.3 oz (94.1 kg) IBW/kg (Calculated) : 75.3  Temp (24hrs), Avg:99 F (37.2 C), Min:98.4 F (36.9 C), Max:99.3 F (37.4 C)   Recent Labs Lab 09/06/17 1720  09/08/17 1651  09/09/17 0351 09/09/17 1514 09/09/17 1526 09/10/17 0232 09/10/17 1553 09/11/17 0337 09/11/17 1600 09/11/17 1715  WBC  --   < > 22.3*  --  17.1* 15.3*  --  13.1*  --  12.3*  --   --   CREATININE  --   < > 1.69*  < > 1.63* 1.80* 1.90* 1.78* 1.80* 1.73* 1.70*  --   VANCOTROUGH 18  --   --   --   --   --   --   --   --   --   --  24*  < > = values in this interval not displayed.  Estimated Creatinine Clearance: 52.1 mL/min (A) (by C-G formula based on SCr of 1.7 mg/dL (H)).    Allergies  Allergen Reactions  . Aspirin Other (See Comments)    Stomach upset  . Latex Rash  . Oxycodone-Acetaminophen Itching   Dose Adjustments: 9/23 VT 16>>continue 1g q12 9/26 VT 24>>1500 q24 9/29 VT 18>>1500 q24 **10/1 extra 1250 mg sent to OR* 10/4 VT 24 >>1250 q24  Antimicrobials this admission:  9/20 Vancomycin >> 9/20 Cefuroxime >> 9/22 9/22 Ceftazidime >>9/28 9/28 Zosyn>>  Microbiology results:  10/1 Sternal wound Cx: px 9/28 bld x2: 1o2 MR-CoNS 9/24 bld x2: ngF 9/24 UCx: ng 9/24 Sputum: ng 9/20 and 9/22 Sputum: neg  Marua Qin D. Kenae Lindquist, PharmD, BCPS Clinical Pharmacist [phone] 09/11/2017 8:16 PM

## 2017-09-11 NOTE — Progress Notes (Signed)
ANTICOAGULATION CONSULT NOTE - Initial Consult  Pharmacy Consult for heparin Indication: atrial fibrillation  Allergies  Allergen Reactions  . Aspirin Other (See Comments)    Stomach upset  . Latex Rash  . Oxycodone-Acetaminophen Itching    Patient Measurements: Height:  (180.3 cm) Weight: 207 lb 7.3 oz (94.1 kg) IBW/kg (Calculated) : 75.3 Heparin Dosing Weight: 94.1kg   Vital Signs: Temp: 99 F (37.2 C) (10/04 1600) Temp Source: Core (Comment) (10/04 1600) BP: 119/60 (10/04 1606) Pulse Rate: 103 (10/04 1600)  Labs:  Recent Labs  09/09/17 1514  09/10/17 0232 09/10/17 1553 09/11/17 0337  HGB 8.3*  < > 8.2* 7.8* 8.5*  HCT 25.4*  < > 24.2* 23.0* 26.4*  PLT 259  --  274  --  301  CREATININE 1.80*  < > 1.78* 1.80* 1.73*  < > = values in this interval not displayed.  Estimated Creatinine Clearance: 51.2 mL/min (A) (by C-G formula based on SCr of 1.73 mg/dL (H)).    Assessment: 76 YOM who is being followed by Heart Failure team, s/p IABP removal and Centrimag, now with AFib to start heparin- aiming for lower, more narrow goal.  Last received prophylactic Lovenox at ~1300 this afternoon.  Hgb 8.5, plts 301.   Goal of Therapy:  Heparin level 0.25-0.35 units/ml Monitor platelets by anticoagulation protocol: Yes   Plan:  Start heparin with no bolus at rate of 1100 units/hr Heparin level in 6 hours Daily heparin level and CBC  Arnell Mausolf D. Wren Gallaga, PharmD, BCPS Clinical Pharmacist [phone] 09/11/2017 4:11 PM

## 2017-09-11 NOTE — Progress Notes (Signed)
PULMONARY / CRITICAL CARE MEDICINE   Name: Clinton Harrington MRN: [id number] DOB: Mar 31, 1951    ADMISSION DATE:  08/23/2017 CONSULTATION DATE:  08/30/17  REFERRING MD: Dr Cornelius Moras  CHIEF COMPLAINT:  Acute resp failure, hypoxia, hypercapnia  brief:  Patient is a 66 y/o Caucasian M with hx CADs/p BMS to RCA in 1999 (due to an inferior STEMI), significant PVD, HTN, Type 2 DM (non-insulin dependent), HLP, Tobacco use comes in today complaining of substernal chest pain on admission.He continued to have active CP and the patient was taken to cath lab for further intervention. FOund with RV infacrct and required emergent bypass surgery. Remained in cardiogenic shock requiring balloon pump and placement of RVAD device. Remained on vent with acute resp failure, skin closed but sternum is not closed.He has developed worsening acidosis and hypoxia with bilateral int infiltrates. He has been pos 4 liters last 2 4 hours. He remains on levophed and milrinone. CCM was asked to assist with vent management.  CULTURES: 9/22 sputum>>>ng 9/24>> Sputum>>ng 9/24 Blood>>NEG  9/24 Urine>>ng 9/28 BC x 2 >GPC , ID panel +MRSA > 10/2>> Tracheal Aspirrate  ANTIBIOTICS: zinacef >>>9/22- 9/24 ceftaz 9/22>>>9/28 Vanc>>>9/24>> Zosyn 9/28 >>  SIGNIFICANT EVENTS: 08/23/2017 - admit 9/22 - intubated Cath cabg RV assist device 9/23 - On epi/levo/RVAD/vent.  CI 1.8 despite all interventions 9/24>> Epi, Levo, milrinine,amio,insulin gtt, Lasix gtt, Heparin, Fent, Versed 9/26 - Remains sedated on fent/ versed drips RVAD flows down to 3200 Good UO on lasix gtt @ 15 9/27 - 40% fio2, peep 8. RPM on RVAD turned down. Stil on levophed, milrinone, epi ggtt and heparin.admio gtt. Making urine. Has chest draines. Good urine outoput. Heparin on hold briefly before resumptions due to bleed in groin site. Getting 2 UI PRBC 9/30 mucous plugging requiring multiple lavages 10/1 RV assist device  removed  in the OR 2/2 bleeding 1-0/2  - Stable off RVAD, remains on levo, vaso,Epi, milrinine  SUBJECTIVE/OVERNIGHT/INTERVAL HX No events overnight, pressor demand decreased dramatically over the past 2 days  VITAL SIGNS: BP 119/61 (BP Location: Left Arm)   Pulse 78   Temp 98.4 F (36.9 C) (Core (Comment))   Resp (!) 21   Ht  (1.803 m)   Wt 94.1 kg (207 lb 7.3 oz)   SpO2 93%   BMI 28.93 kg/m   HEMODYNAMICS: PAP: (24-41)/(7-23) 30/19 CVP:  [6 mmHg-19 mmHg] 16 mmHg CO:  [6.5 L/min] 6.5 L/min CI:  [3 L/min/m2] 3 L/min/m2  VENTILATOR SETTINGS: Vent Mode: CPAP;PSV FiO2 (%):  [40 %-50 %] 40 % Set Rate:  [20 bmp] 20 bmp Vt Set:  [560 mL] 560 mL PEEP:  [5 cmH20] 5 cmH20 Pressure Support:  [10 cmH20] 10 cmH20 Plateau Pressure:  [20 cmH20-24 cmH20] 21 cmH20  INTAKE / OUTPUT: I/O last 3 completed shifts: In: 6019.1 [I.V.:3829.6; Blood:330; NG/GT:1259.5; IV Piggyback:600] Out: 9600 [Urine:7630; Emesis/NG output:1400; Other:100; Chest Tube:470]  PHYSICAL EXAMINATION: General:  Critically ill obese male intubates and on vent, follows some command HEENT: [place]/AT, PERRL, oral ETT, feeding tube Neuro: Sedated, opens eyes with WUA, but does not follow commands, or focus CV: RRR, Nl S1/S2, -M/R/G. PULM: Crackles throughout, diminished per bases GI: Obese,Soft, NT, ND and +BS Extremities: warm/dry , trace  edema  Skin: Midline  sternal dressing , no rash or lesions,pale  PULMONARY  Recent Labs Lab 09/08/17 0955 09/08/17 0958 09/08/17 1159  09/08/17 1715 09/09/17 0355 09/09/17 0401 09/09/17 1526 09/10/17 0250 09/10/17 0325 09/10/17 1553 09/11/17 0335 09/11/17 0352  PHART 7.414  --  7.360  --   --  7.403  --   --   --  7.392  --  7.427  --   PCO2ART 45.6  --  45.7  --   --  39.1  --   --   --  39.8  --  42.5  --   PO2ART 329.0*  --  83.0  --   --  108  --   --   --  132*  --  94.0  --   HCO3 29.2*  --  25.9  --   --  23.9  --   --   --  23.7  --  27.5  --   TCO2 --  24  --   --  23  --   --  25   --   --   O2SAT 100.0  --  96.0  < >  --  98.2 72.5  --  75.3 98.8  --  97.5 73.4  < > = values in this interval not displayed.  CBC  Recent Labs Lab 09/09/17 1514  09/10/17 0232 09/10/17 1553 09/11/17 0337  HGB 8.3*  < > 8.2* 7.8* 8.5*  HCT 25.4*  < > 24.2* 23.0* 26.4*  WBC 15.3*  --  13.1*  --  12.3*  PLT 259  --  274  --  301  < > = values in this interval not displayed. COAGULATION  Recent Labs Lab 09/05/17 0346 09/06/17 0500 09/07/17 0206 09/08/17 0500 09/08/17 1145  INR 1.16 1.35 1.56 1.61 1.58   CARDIAC  No results for input(s): TROPONINI in the last 168 hours. No results for input(s): PROBNP in the last 168 hours.  CHEMISTRY  Recent Labs Lab 09/05/17 0346  09/08/17 0500  09/08/17 1410 09/08/17 1651  09/09/17 0351 09/09/17 1514 09/09/17 1526 09/10/17 0232 09/10/17 1553 09/11/17 0337  NA 137  < > 134*  < > 133*  --   < > 133*  --  131* 134* 135 137  K 3.6  < > 3.9  < > 4.8  --   < > 4.6  --  5.7* 3.9 5.0 3.2*  CL 93*  < > 102  < > 102  --   < > 99*  --  99* 100* 98* 99*  CO2 33*  < > 24  --  23  --   --  25  --   --  25  --  29  GLUCOSE 90  < > 75  < > 87  --   < > 66  --  304* 223* 333* 183*  BUN 55*  < > 64*  < > 64*  --   < > 63*  --  70* 65* 69* 65*  CREATININE 1.37*  < > 1.67*  < > 1.62* 1.69*  < > 1.63* 1.80* 1.90* 1.78* 1.80* 1.73*  CALCIUM 8.6*  < > 7.3*  --  7.3*  --   --  7.3*  --   --  7.2*  --  7.6*  MG 1.7  --   --   --   --  3.3*  --  3.0* 3.0*  --  2.6*  --   --   PHOS  --   --   --   --   --   --   --   --   --   --  4.3  --   --   < > =  values in this interval not displayed. Estimated Creatinine Clearance: 51.2 mL/min (A) (by C-G formula based on SCr of 1.73 mg/dL (H)).  LIVER  Recent Labs Lab 09/05/17 0346 09/06/17 0500 09/07/17 0206 09/08/17 0500 09/08/17 1145 09/10/17 0232 09/11/17 0337  AST  --  45* 41 60*  --  86* 74*  ALT  --  18 15* 18  --  26 27  ALKPHOS  --  59 83 52  --  90 87  BILITOT  --  0.6 0.8 0.7  --  0.7  0.9  PROT  --  5.8* 4.9* 5.1*  --  5.7* 6.3*  ALBUMIN  --  2.2* 1.9* 1.8*  --  1.8* 2.4*  INR 1.16 1.35 1.56 1.61 1.58  --   --    INFECTIOUS No results for input(s): LATICACIDVEN, PROCALCITON in the last 168 hours.  ENDOCRINE CBG (last 3)   Recent Labs  09/10/17 1940 09/10/17 2343 09/11/17 0349  GLUCAP 259* 195* 169*   IMAGING x48h  - image(s) personally visualized  -   highlighted in bold Dg Chest Port 1 View  Result Date: 09/11/2017 CLINICAL DATA:  ETT, chest tube present status post CABG EXAM: PORTABLE CHEST 1 VIEW COMPARISON:  09/10/2017 FINDINGS: Endotracheal tube terminates 4.5 cm above the carina. Mild interstitial edema.  Suspected small left pleural effusion. Bilateral chest tubes and mediastinal drain. No pneumothorax is seen. Cardiomegaly.  Postsurgical changes related to prior CABG. Left subclavian Swan-Ganz catheter terminates in the main pulmonary artery. Right subclavian catheter terminates in the lower SVC. Enteric tube courses into the stomach. IMPRESSION: Postsurgical changes related to prior CABG. Bilateral chest tubes and mediastinal drain. No pneumothorax is seen. Mild interstitial edema.  Small left pleural effusion. Endotracheal tube terminates 4.5 cm above the carina. Additional support apparatus as above. Electronically Signed   By: Charline Bills M.D.   On: 09/11/2017 07:36   Dg Chest Port 1 View  Result Date: 09/10/2017 CLINICAL DATA:  Status post CABG on August 28, 2017 with removal of left ventricular assist device on September 08, 2017. EXAM: PORTABLE CHEST 1 VIEW COMPARISON:  Portable chest x-ray of September 09, 2017 FINDINGS: The lungs are reasonably well inflated. There is persistent increased interstitial density in the left mid and lower lung but this is improving. There is improved visualization of the left hemidiaphragm. The cardiac silhouette remains enlarged. The pulmonary vascularity is less engorged and more distinct. Bilateral chest tubes and the  mediastinal drain are in stable position. The Swan-Ganz catheter tip projects in the main pulmonary outflow tract. The endotracheal tube tip projects approximately 3 cm above the carina. The esophagogastric tube tip and feeding tube tip project below the inferior margin of the image. The right-sided PICC line tip projects over the midportion of the SVC. IMPRESSION: Improving pulmonary interstitial edema. Decreasing left basilar atelectasis or pneumonia. No significant pleural effusion and no pneumothorax. The support tubes are in reasonable position bilaterally. Electronically Signed   By: [name]  [place] M.D.   On: 09/10/2017 07:43   ASSESSMENT / PLAN:  PULMONARY A: 09/11/2017 -> acute resp failure due to cardiogenic shock Remains pressor dependent/ sedated Not following commands on WUA Mucus plugging overnight with desaturation ? Infiltrate per CXR vs atelectasis Pulmonary edema>> will need diuresis before weaning.>> + 2.4 L last 24  P:   Begin PS trials, no extubation yet til more diureses Continue CXR and ABG daily Titrate O2 for sat of 88-92% Continue lasix gtt>> need net negative to wean Add  zaroxolyn 5 mg PO x1 If no extubation by Monday then will need to consider tracheostomy on Monday.  CARDIOVASCULAR A:  09/11/2017 -> cardiogenic shock  P:  Per cards and cvts RVAD  removed 10/2 Down to 1 mcg of epi, and will keep that for now per CVTS Maintain MAP > 65   RENAL  Intake/Output Summary (Last 24 hours) at 09/11/17 0846 Last data filed at 09/11/17 0820  Gross per 24 hour  Intake           4593.7 ml  Output             6975 ml  Net          -2381.3 ml   Recent Labs Lab 09/09/17 1514 09/09/17 1526 09/10/17 0232 09/10/17 1553 09/11/17 0337  CREATININE 1.80* 1.90* 1.78* 1.80* 1.73*  A:   Acute renal failure, UOP remains as above  P:   Monitor renal function Avoid nephro toxic medications Trend UOP Replace electrolytes as needed  Trend BMET daily Zaroxolyn 5 mg PO  x1 Lasix drip 12 mg/hr  GASTROINTESTINAL A:   Malnutrition , tolerating TF  GIB - continued drk blood in OGT , but less than over weekend P:   TF continue per nutrition PPI q12  Lovenox dose to prophylaxis since RVAD is out Monitor for worsening  HEMATOLOGIC  Recent Labs Lab 09/09/17 1514  09/10/17 0232 09/10/17 1553 09/11/17 0337  HGB 8.3*  < > 8.2* 7.8* 8.5*  HCT 25.4*  < > 24.2* 23.0* 26.4*  WBC 15.3*  --  13.1*  --  12.3*  PLT 259  --  274  --  301  < > = values in this interval not displayed. A:   RBC: anemia criticall illness>hbg drop /GIB s/p 4 u PRBC 9/29 -9/30, HGB drop .8 gram last 24 Platelet *normal Leukocytosis P:  Trend CBC PRBC for hgb </= 8-8.5gm%   Transfuse per ICU protocol  INFECTIOUS A:   Possible PNA  ?MRSA Bactermia  P:   Cont on Vanc/Zosyn  Follow cx data  Sent tracheal aspirate for culture, pending Trend fever curve and WBC  ENDOCRINE A:   Hyperglycemia improved after reducing stress dose steroids P:   ICU hyperglycemia protocol Continue hydrocortisone 50 mg daily  CBG Q 4  SSI   NEUROLOGIC A:   #Current: Not following commands on WUA per nursing  P:   RASS goal: 0 to -3 Fent drip PRN versed Precedex to continue  FAMILY  - Updates: Discussed with CVTS-MD, if no extubation by Monday then will need tracheostomy.  The patient is critically ill with multiple organ systems failure and requires high complexity decision making for assessment and support, frequent evaluation and titration of therapies, application of advanced monitoring technologies and extensive interpretation of multiple databases.   Critical Care Time devoted to patient care services described in this note is  35  Minutes. This time reflects time of care of this signee Dr Koren Bound. This critical care time does not reflect procedure time, or teaching time or supervisory time of PA/NP/Med student/Med Resident etc but could involve care discussion time.  Alyson Reedy, M.D. [facility] Pulmonary/Critical Care Medicine. Pager: [phone]. After hours pager: [phone].  09/09/2017 at [phone]

## 2017-09-11 NOTE — Progress Notes (Signed)
Pt converted into [facility] at this time, MD came to room during rounding, verbal order for Amio bolus.

## 2017-09-11 NOTE — Progress Notes (Signed)
3 Days Post-Op Procedure(s) (LRB): REMOVAL OF R-VENTRICULAR ASSIST DEVICE WITH CARDIOPULMONARY BYPASS (N/A) TRANSESOPHAGEAL ECHOCARDIOGRAM (TEE) (N/A) Subjective: cardiogenic shock following RV infarct 10 days of Centrimag RVAD support RVAD removed and sternum closed 9-30 Now with stable hemodynamics, nsr Remains vent dependent from prepop COPD, ARDS   Will need trachmper CCM Monday oct 8 if stll intubated MRSA bacteremia prob from line sepsis- all lines changed when RVAD decanulated  Now stable on .40 FiO2 Neuro intact Excellent diuresis on lasix drip Objective: Vital signs in last 24 hours: Temp:  [98.4 F (36.9 C)-100.6 F (38.1 C)] 99.3 F (37.4 C) (10/04 1400) Pulse Rate:  [76-87] 81 (10/04 1400) Cardiac Rhythm: Normal sinus rhythm (10/04 1200) Resp:  [18-26] 20 (10/04 1400) BP: (104-152)/(52-64) 108/58 (10/04 1400) SpO2:  [93 %-98 %] 94 % (10/04 1400) Arterial Line BP: (107-171)/(40-55) 110/47 (10/04 1400) FiO2 (%):  [40 %-50 %] 40 % (10/04 1325) Weight:  [207 lb 7.3 oz (94.1 kg)] 207 lb 7.3 oz (94.1 kg) (10/04 0600)  Hemodynamic parameters for last 24 hours: PAP: (24-39)/(7-20) 30/18 CVP:  [6 mmHg-18 mmHg] 14 mmHg CO:  [6.5 L/min] 6.5 L/min CI:  [3 L/min/m2] 3 L/min/m2  Intake/Output from previous day: 10/03 0701 - 10/04 0700 In: 3926.6 [I.V.:2277.1; Blood:330; NG/GT:719.5; IV Piggyback:600] Out: 6720 [Urine:5200; Emesis/NG output:1200; Chest Tube:220] Intake/Output this shift: Total I/O In: 1806.2 [I.V.:466.2; NG/GT:940; IV Piggyback:400] Out: 2255 [Urine:1625; Emesis/NG output:600; Chest Tube:30]       Exam    General- alert and comfortable   Lungs- clear without rales, wheezes   Cor- regular rate and rhythm, no murmur , gallop   Abdomen- soft, non-tender   Extremities - warm, non-tender, minimal edema   Neuro- oriented, appropriate, no focal weakness    Lab Results:  Recent Labs  09/10/17 0232 09/10/17 1553 09/11/17 0337  WBC 13.1*  --  12.3*   HGB 8.2* 7.8* 8.5*  HCT 24.2* 23.0* 26.4*  PLT 274  --  301   BMET:  Recent Labs  09/10/17 0232 09/10/17 1553 09/11/17 0337  NA 134* 135 137  K 3.9 5.0 3.2*  CL 100* 98* 99*  CO2 25  --  29  GLUCOSE 223* 333* 183*  BUN 65* 69* 65*  CREATININE 1.78* 1.80* 1.73*  CALCIUM 7.2*  --  7.6*    PT/INR: No results for input(s): LABPROT, INR in the last 72 hours. ABG    Component Value Date/Time   PHART 7.427 09/11/2017 0335   HCO3 27.5 09/11/2017 0335   TCO2 25 09/10/2017 1553   ACIDBASEDEF 0.5 09/10/2017 0325   O2SAT 73.4 09/11/2017 0352   CBG (last 3)   Recent Labs  09/11/17 0349 09/11/17 0857 09/11/17 1204  GLUCAP 169* 152* 157*    Assessment/Plan: S/P Procedure(s) (LRB): REMOVAL OF R-VENTRICULAR ASSIST DEVICE WITH CARDIOPULMONARY BYPASS (N/A) TRANSESOPHAGEAL ECHOCARDIOGRAM (TEE) (N/A) Cont PS vent wean, lasix drip, vancomycin, Tube feeds Trach Monday if not extubated   LOS: 19 days    Clinton Harrington 09/11/2017

## 2017-09-12 ENCOUNTER — Inpatient Hospital Stay (HOSPITAL_COMMUNITY): Payer: PPO

## 2017-09-12 DIAGNOSIS — J9601 Acute respiratory failure with hypoxia: Secondary | ICD-10-CM

## 2017-09-12 DIAGNOSIS — J189 Pneumonia, unspecified organism: Secondary | ICD-10-CM

## 2017-09-12 LAB — BLOOD GAS, ARTERIAL
Acid-Base Excess: 4.9 mmol/L — ABNORMAL HIGH (ref 0.0–2.0)
Bicarbonate: 28.8 mmol/L — ABNORMAL HIGH (ref 20.0–28.0)
Drawn by: 51132
FIO2: 40
MECHVT: 560 mL
O2 Saturation: 96.8 %
PEEP: 5 cmH2O
Patient temperature: 98.6
RATE: 20 resp/min
pCO2 arterial: 41.6 mmHg (ref 32.0–48.0)
pH, Arterial: 7.454 — ABNORMAL HIGH (ref 7.350–7.450)
pO2, Arterial: 85.6 mmHg (ref 83.0–108.0)

## 2017-09-12 LAB — CBC
HCT: 29.6 % — ABNORMAL LOW (ref 39.0–52.0)
Hemoglobin: 9.5 g/dL — ABNORMAL LOW (ref 13.0–17.0)
MCH: 28.8 pg (ref 26.0–34.0)
MCHC: 32.1 g/dL (ref 30.0–36.0)
MCV: 89.7 fL (ref 78.0–100.0)
Platelets: 463 10*3/uL — ABNORMAL HIGH (ref 150–400)
RBC: 3.3 MIL/uL — ABNORMAL LOW (ref 4.22–5.81)
RDW: 15.2 % (ref 11.5–15.5)
WBC: 12.6 10*3/uL — ABNORMAL HIGH (ref 4.0–10.5)

## 2017-09-12 LAB — COOXEMETRY PANEL
Carboxyhemoglobin: 1.1 % (ref 0.5–1.5)
Methemoglobin: 1.1 % (ref 0.0–1.5)
O2 Saturation: 72.7 %
Total hemoglobin: 9.9 g/dL — ABNORMAL LOW (ref 12.0–16.0)

## 2017-09-12 LAB — COMPREHENSIVE METABOLIC PANEL
ALT: 29 U/L (ref 17–63)
AST: 66 U/L — ABNORMAL HIGH (ref 15–41)
Albumin: 2.4 g/dL — ABNORMAL LOW (ref 3.5–5.0)
Alkaline Phosphatase: 88 U/L (ref 38–126)
Anion gap: 13 (ref 5–15)
BUN: 67 mg/dL — ABNORMAL HIGH (ref 6–20)
CO2: 27 mmol/L (ref 22–32)
Calcium: 8.1 mg/dL — ABNORMAL LOW (ref 8.9–10.3)
Chloride: 96 mmol/L — ABNORMAL LOW (ref 101–111)
Creatinine, Ser: 1.69 mg/dL — ABNORMAL HIGH (ref 0.61–1.24)
GFR calc Af Amer: 48 mL/min — ABNORMAL LOW (ref >60)
GFR calc non Af Amer: 41 mL/min — ABNORMAL LOW (ref >60)
Glucose, Bld: 222 mg/dL — ABNORMAL HIGH (ref 65–99)
Potassium: 3.2 mmol/L — ABNORMAL LOW (ref 3.5–5.1)
Sodium: 136 mmol/L (ref 135–145)
Total Bilirubin: 0.7 mg/dL (ref 0.3–1.2)
Total Protein: 7.1 g/dL (ref 6.5–8.1)

## 2017-09-12 LAB — CULTURE, BAL-QUANTITATIVE W GRAM STAIN
Culture: 200 — AB
Special Requests: NORMAL

## 2017-09-12 LAB — GLUCOSE, CAPILLARY
GLUCOSE-CAPILLARY: 203 mg/dL — AB (ref 65–99)
GLUCOSE-CAPILLARY: 224 mg/dL — AB (ref 65–99)
GLUCOSE-CAPILLARY: 175 mg/dL — AB (ref 65–99)
Glucose-Capillary: 223 mg/dL — ABNORMAL HIGH (ref 65–99)
Glucose-Capillary: 216 mg/dL — ABNORMAL HIGH (ref 65–99)

## 2017-09-12 LAB — POCT I-STAT, CHEM 8
BUN: 60 mg/dL — ABNORMAL HIGH (ref 6–20)
CHLORIDE: 98 mmol/L — AB (ref 101–111)
Calcium, Ion: 1.1 mmol/L — ABNORMAL LOW (ref 1.15–1.40)
Creatinine, Ser: 1.5 mg/dL — ABNORMAL HIGH (ref 0.61–1.24)
GLUCOSE: 251 mg/dL — AB (ref 65–99)
HCT: 30 % — ABNORMAL LOW (ref 39.0–52.0)
Hemoglobin: 10.2 g/dL — ABNORMAL LOW (ref 13.0–17.0)
Potassium: 3.4 mmol/L — ABNORMAL LOW (ref 3.5–5.1)
Sodium: 140 mmol/L (ref 135–145)
TCO2: 31 mmol/L (ref 22–32)

## 2017-09-12 LAB — CULTURE, BAL-QUANTITATIVE

## 2017-09-12 LAB — PHOSPHORUS: PHOSPHORUS: 3.3 mg/dL (ref 2.5–4.6)

## 2017-09-12 LAB — HEPARIN LEVEL (UNFRACTIONATED)
Heparin Unfractionated: 0.2 IU/mL — ABNORMAL LOW (ref 0.30–0.70)
Heparin Unfractionated: 0.17 IU/mL — ABNORMAL LOW (ref 0.30–0.70)
Heparin Unfractionated: 0.13 IU/mL — ABNORMAL LOW (ref 0.30–0.70)

## 2017-09-12 LAB — MAGNESIUM: Magnesium: 2.2 mg/dL (ref 1.7–2.4)

## 2017-09-12 MED ORDER — POTASSIUM CHLORIDE 10 MEQ/50ML IV SOLN
10.0000 meq | INTRAVENOUS | Status: AC
  Administered 2017-09-12 (×2): 10 meq via INTRAVENOUS
  Filled 2017-09-12 (×2): qty 50

## 2017-09-12 MED ORDER — INSULIN GLARGINE 100 UNIT/ML ~~LOC~~ SOLN: 40.0000 [IU] | Freq: Two times a day (BID) | SUBCUTANEOUS | Status: DC

## 2017-09-12 MED ORDER — INSULIN GLARGINE 100 UNIT/ML ~~LOC~~ SOLN
35.0000 [IU] | Freq: Two times a day (BID) | SUBCUTANEOUS | Status: DC
  Administered 2017-09-12 – 2017-09-13 (×3): 35 [IU] via SUBCUTANEOUS
  Filled 2017-09-12 (×4): qty 0.35

## 2017-09-12 MED ORDER — CHLORHEXIDINE GLUCONATE CLOTH 2 % EX PADS
6.0000 | MEDICATED_PAD | Freq: Every day | CUTANEOUS | Status: DC
  Administered 2017-09-13 – 2017-09-25 (×12): 6 via TOPICAL

## 2017-09-12 MED ORDER — POTASSIUM CHLORIDE 10 MEQ/50ML IV SOLN
10.0000 meq | INTRAVENOUS | Status: AC
  Administered 2017-09-12 (×3): 10 meq via INTRAVENOUS
  Filled 2017-09-12 (×2): qty 50

## 2017-09-12 MED ORDER — POTASSIUM CHLORIDE 10 MEQ/50ML IV SOLN
10.0000 meq | INTRAVENOUS | Status: AC
  Administered 2017-09-12 (×3): 10 meq via INTRAVENOUS
  Filled 2017-09-12 (×3): qty 50

## 2017-09-12 MED ORDER — SPIRONOLACTONE 25 MG PO TABS
25.0000 mg | ORAL_TABLET | Freq: Every day | ORAL | Status: DC
  Administered 2017-09-12 – 2017-09-16 (×4): 25 mg
  Filled 2017-09-12 (×4): qty 1

## 2017-09-12 MED ORDER — POTASSIUM CHLORIDE 10 MEQ/50ML IV SOLN
10.0000 meq | INTRAVENOUS | Status: AC
  Filled 2017-09-12: qty 50

## 2017-09-12 NOTE — Care Management Note (Addendum)
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  Presents  With STEMI, 3 vessel disease, Cardiogenic shock, acute renal failure.  Plans is for CABG this week, conts on milrinone and IABP.     9/18 1547 NCM spoke with Gwenlyn Perking the CSW with [facility], stating that patient's mom will be able to stay there until he is discharged from the hospital,  This information was relayed to the patient and his RN.     9/21 1408 Letha Cape RN, BSN - post op CABG ,  On vent, on amio, precedex, dopamine and norepi.  9/25 1336 Letha Cape RN, BSN - conts to be on vent  POD 5 CABG with centrimag RVAD, afib, NG tube feeds, chest tubes to suction, pressors, amio, milrinone, lasix drip.       10/1 1442 Letha Cape RN, BSN - Removal of RVAD today, sternum closed, conts on vent, CVP  20, pressor/inotrope support, nitor oxide, cont to diurese, amio  10/5 1107 Letha Cape RN,BSN- conts on full vent support, afib, hcap, if not extubated this weekend, will need Trach per MD note.  conts on eipi, milrinone, amio.   10/8 1005 Letha Cape RN, BSN - Extubated today, s/p CABG with IABP removal and RV removal, now with recurrent afib s/p cardioversion and being treated for HCAP with vanc and zosyn, conts on milrinone, hep drip, amio drip.  [facility] consulted for EMMI.                Action/Plan: NCM will follow for dc needs.   Expected Discharge Date:                  Expected Discharge Plan:     In-House Referral:     Discharge planning Services  CM Consult  Post Acute Care Choice:    Choice offered to:     DME Arranged:    DME Agency:     HH Arranged:    HH Agency:     Status of Service:  In process, will continue to follow  If discussed at Long Length of Stay Meetings, dates discussed:    Additional Comments:  Leone Haven,  RN 09/12/2017, 11:07 AM

## 2017-09-12 NOTE — Progress Notes (Signed)
Inpatient Diabetes Program Recommendations  AACE/ADA: New Consensus Statement on Inpatient Glycemic Control (2015)  Target Ranges:  Prepandial:   less than 140 mg/dL      Peak postprandial:   less than 180 mg/dL (1-2 hours)      Critically ill patients:  140 - 180 mg/dL   Lab Results  Component Value Date   GLUCAP 216 (H) 09/12/2017   HGBA1C 5.9 (H) 08/25/2017    Review of Glycemic ControlResults for Clinton Harrington, Clinton Harrington (MRN [id number]) as of 09/12/2017 11:02  Ref. Range 09/11/2017 15:56 09/11/2017 19:34 09/11/2017 23:40 09/12/2017 05:30 09/12/2017 07:28  Glucose-Capillary Latest Ref Range: 65 - 99 mg/dL 086 (H) 578 (H) 469 (H) 223 (H) 216 (H)   Diabetes history: Type 2 diabetes Outpatient Diabetes medications: Metformin 1000 mg bid, Amaryl 4 mg bid  Current orders for Inpatient glycemic control:  Vital 50 cc/hr Novolog TCTS q 4 hours Lantus 35 units bid (increased today  Inpatient Diabetes Program Recommendations:    If blood sugars continue to be >180 mg/dL, consider adding Novolog 3 units q 4 hours to cover tube feeds.   Thanks, Beryl Meager, RN, BC-ADM Inpatient Diabetes Coordinator Pager [phone] (8a-5p)

## 2017-09-12 NOTE — Progress Notes (Addendum)
PULMONARY / CRITICAL CARE MEDICINE   Name: Clinton Harrington MRN: [id number] DOB: July 13, 1951    ADMISSION DATE:  08/23/2017 CONSULTATION DATE:  08/30/2017  REFERRING MD:  Cornelius Moras  CHIEF COMPLAINT:  Acute respiratory failure with hypoxemia  BRIEF:  66 y/o male with CAD who had an MI on 9/15, had severe RV failure, was treated with CABG, IABP, eventual centrimag device placed.  Post operatively was weaned off of the RVAD device on 10/1, remains mechanically ventilated.  Currently being treated for LLL HCAP.  PAST MEDICAL HISTORY :  He  has a past medical history of Arthritis; Diabetes (HCC); Diabetes mellitus (Jan. 1999); High cholesterol; Hypertension; Myocardial infarct (HCC); Peripheral vascular disease (HCC); Seasonal allergies; Smoker; and Urine incontinence.   REVIEW OF SYSTEMS:   Cannot obtain due to intubation  SUBJECTIVE:  Failed weaning attempt this morning, low O2 saturation on wean Following commands but concern for left side weakness  VITAL SIGNS: BP (!) 164/75   Pulse 79   Temp 99 F (37.2 C)   Resp (!) 22   Ht  (1.803 m)   Wt 200 lb 13.4 oz (91.1 kg)   SpO2 (!) 85%   BMI 28.01 kg/m   HEMODYNAMICS: PAP: (21-34)/(9-22) 24/12 CVP:  [6 mmHg-32 mmHg] 9 mmHg CO:  [8.1 L/min] 8.1 L/min CI:  [3.7 L/min/m2] 3.7 L/min/m2  VENTILATOR SETTINGS: Vent Mode: PRVC FiO2 (%):  [40 %] 40 % Set Rate:  [20 bmp] 20 bmp Vt Set:  [560 mL] 560 mL PEEP:  [5 cmH20] 5 cmH20 Pressure Support:  [10 cmH20] 10 cmH20 Plateau Pressure:  [17 cmH20-26 cmH20] 26 cmH20  INTAKE / OUTPUT: I/O last 3 completed shifts: In: 6448.8 [I.V.:3368.8; Blood:330; NG/GT:2000; IV Piggyback:750] Out: 40981 [Urine:8715; Emesis/NG output:2650; Chest Tube:230]  PHYSICAL EXAMINATION:  General:  In bed on vent HENT: NCAT ETT in place PULM: CTA B, rhonchi on left CV: RRR, no mgr GI: BS+, soft, nontender MSK: normal bulk and tone Neuro: awake on vent, doesn't move left arm or leg to command for me  but will move right side   LABS:  BMET  Recent Labs Lab 09/10/17 0232  09/11/17 0337 09/11/17 1600 09/12/17 0508  NA 134*  < > 137 138 136  K 3.9  < > 3.2* 4.9 3.2*  CL 100*  < > 99* 100* 96*  CO2 25  --  29  --  27  BUN 65*  < > 65* 68* 67*  CREATININE 1.78*  < > 1.73* 1.70* 1.69*  GLUCOSE 223*  < > 183* 215* 222*  < > = values in this interval not displayed.  Electrolytes  Recent Labs Lab 09/09/17 1514 09/10/17 0232 09/11/17 0337 09/12/17 0508  CALCIUM  --  7.2* 7.6* 8.1*  MG 3.0* 2.6*  --  2.2  PHOS  --  4.3  --  3.3    CBC  Recent Labs Lab 09/10/17 0232  09/11/17 0337 09/11/17 1600 09/12/17 0508  WBC 13.1*  --  12.3*  --  12.6*  HGB 8.2*  < > 8.5* 9.5* 9.5*  HCT 24.2*  < > 26.4* 28.0* 29.6*  PLT 274  --  301  --  463*  < > = values in this interval not displayed.  Coag's  Recent Labs Lab 09/07/17 0206 09/08/17 0500 09/08/17 1145  APTT 74* 69* 39*  INR 1.56 1.61 1.58    Sepsis Markers No results for input(s): LATICACIDVEN, PROCALCITON, O2SATVEN in the last 168 hours.  ABG  Recent  Labs Lab 09/10/17 0325 09/11/17 0335 09/12/17 0327  PHART 7.392 7.427 7.454*  PCO2ART 39.8 42.5 41.6  PO2ART 132* 94.0 85.6    Liver Enzymes  Recent Labs Lab 09/10/17 0232 09/11/17 0337 09/12/17 0508  AST 86* 74* 66*  ALT ALKPHOS 90 87 88  BILITOT 0.7 0.9 0.7  ALBUMIN 1.8* 2.4* 2.4*    Cardiac Enzymes No results for input(s): TROPONINI, PROBNP in the last 168 hours.  Glucose  Recent Labs Lab 09/11/17 1204 09/11/17 1556 09/11/17 1934 09/11/17 2340 09/12/17 0530 09/12/17 0728  GLUCAP 157* 196* 263* 206* 223* 216*    Imaging Dg Chest Port 1 View  Result Date: 09/12/2017 CLINICAL DATA:  Status post coronary bypass grafting EXAM: PORTABLE CHEST 1 VIEW COMPARISON:  09/11/2017 FINDINGS: Cardiac shadow is mildly enlarged. Postsurgical changes are again seen. Bilateral thoracostomy catheters, mediastinal drain and right subclavian  central line are seen. Swan-Ganz catheter is noted in the pulmonary outflow tract. Endotracheal tube and nasogastric catheter are seen as well. Patchy infiltrative changes are noted on the left relatively stable from the prior exam. No pneumothorax is noted. IMPRESSION: Tubes and lines as described above. Patchy left lung changes slightly increased from the prior exam. Electronically Signed   By: Alcide Clever M.D.   On: 09/12/2017 07:10    CULTURES: 9/22 sputum>>>ng 9/24>> Sputum>>ng 9/24 Blood>>NEG  9/24 Urine>>ng 9/28 BC x 2 >coag negative staph 1/4 10/2>> respiratory flora, candida albicans  ANTIBIOTICS: zinacef >>>9/22- 9/24 ceftaz 9/22>>>9/28 Vanc>>>9/24>> Zosyn 9/28 >>  SIGNIFICANT EVENTS: 08/23/2017 - admit 9/22 - intubated Cath cabg RV assist device 9/23 - On epi/levo/RVAD/vent.  CI 1.8 despite all interventions 9/24>> Epi, Levo, milrinine,amio,insulin gtt, Lasix gtt, Heparin, Fent, Versed 9/26 - Remains sedated on fent/ versed drips RVAD flows down to 3200 Good UO on lasix gtt @ 15 9/27 - 40% fio2, peep 8. RPM on RVAD turned down. Stil on levophed, milrinone, epi ggtt and heparin.admio gtt. Making urine. Has chest draines. Good urine outoput. Heparin on hold briefly before resumptions due to bleed in groin site. Getting 2 UI PRBC 9/30 mucous plugging requiring multiple lavages 10/1 RV assist device  removed  in the OR 2/2 bleeding 1-0/2 - Stable off RVAD, remains on levo, vaso,Epi, milrinone  Lines/tubes: 10/1 R subclavian CVL >   DISCUSSION: 66 y/o male s/p MI, RV failure, CABG, centrimag RV support deviced (now weaned off) who remains intubated for respiratory failure.  Currently being treated for HCAP.   ASSESSMENT / PLAN:  PULMONARY A: Acute respiratory failure with hypoxemia > desaturating today HCAP P:   Full mechanical vent support VAP prevention Daily WUA/SBT Bag lavage today  May need bronch Try to keep left side up, not down  CARDIOVASCULAR A:   Cardiogenic shock > improving CAD, s/p MI and CABG RV failure P:  Vasopressors/cardiac meds per cardiology/TCTS Tele monitoring Continue hydrocortisone for now  RENAL A:   AKI> stable Cr around 1.6 P:   Renal dose medications Monitor BMET and UOP Replace electrolytes as needed Lasix drip per cardiology service  GASTROINTESTINAL A:   No acute issues P:   Tube feeding  HEMATOLOGIC A:   No acute issues P:  Monitor for bleeding  INFECTIOUS A:   HCAP Coag neg staph in blood> most likely contaminant P:   F/u respiratory cultures Could stop diflucan Would plan 5 days total HCAP coverage with Vanc/Zosyn  ENDOCRINE A:   DM2, persistent hyperglycemia   P:   Continue SSI Increase glargine  to 35 U BID  NEUROLOGIC A:   New left sided weakness? P:   CT head Continue precedex for sedation needs, titrated to RASS goal -1 to -2  FAMILY  - Updates:  None bedside  My cc time 32 minutes  Heber Plainfield, MD [facility] Pager: [phone] Cell: [phone] After 3pm or if no response, call [phone]    09/12/2017, 8:54 AM

## 2017-09-12 NOTE — Progress Notes (Signed)
ANTICOAGULATION CONSULT NOTE - Follow-Up Consult  Pharmacy Consult for heparin Indication: atrial fibrillation  Allergies  Allergen Reactions  . Aspirin Other (See Comments)    Stomach upset  . Latex Rash  . Oxycodone-Acetaminophen Itching    Patient Measurements: Height:  (180.3 cm) Weight: 200 lb 13.4 oz (91.1 kg) IBW/kg (Calculated) : 75.3 Heparin Dosing Weight: 94.1kg   Vital Signs: Temp: 99.7 F (37.6 C) (10/05 1300) Temp Source: Core (Comment) (10/05 0800) BP: 115/60 (10/05 1300) Pulse Rate: 74 (10/05 1300)  Labs:  Recent Labs  09/10/17 0232  09/11/17 0337 09/11/17 1600 09/11/17 2240 09/12/17 0508 09/12/17 1327  HGB 8.2*  < > 8.5* 9.5*  --  9.5*  --   HCT 24.2*  < > 26.4* 28.0*  --  29.6*  --   PLT 274  --  301  --   --  463*  --   HEPARINUNFRC  --   --   --   --  0.27* 0.20* 0.17*  CREATININE 1.78*  < > 1.73* 1.70*  --  1.69*  --   < > = values in this interval not displayed.  Estimated Creatinine Clearance: 51.6 mL/min (A) (by C-G formula based on SCr of 1.69 mg/dL (H)).    Assessment: 36 yoM s/p IABP removal and Centrimag now with recurrent AFib to start heparin on heparin. Heparin level slightly subtherapeutic at 0.17 despite rate increase this morning. CBC low but stable.  Goal of Therapy:  Heparin level 0.25-0.35 units/ml Monitor platelets by anticoagulation protocol: Yes   Plan:  -Increase heparin to 1200 units/hr -Check 6-hr heparin level -Monitor heparin level, CBC, S/Sx bleeding daily  Fredonia Highland, PharmD PGY-2 Cardiology Pharmacy Resident Pager: [phone] 09/12/2017

## 2017-09-12 NOTE — Progress Notes (Signed)
Nutrition Follow Up  DOCUMENTATION CODES:   Not applicable  INTERVENTION:    Continue Vital 1.5 formula at goal rate of 50 ml/hr with Prostat 30 ml TID  TF regimen providing 2100 kcals, 126 gm protein, 917 ml of free water daily   NUTRITION DIAGNOSIS:   Inadequate oral intake related to inability to eat as evidenced by NPO status, ongoing  GOAL:   Patient will meet greater than or equal to 90% of their needs, met  MONITOR:   Vent status, TF tolerance, Weight trends, Skin, Labs, I & O's  ASSESSMENT:   66 yo Male with PMH of DM, HTN; found to have severe three-vessel disease; presented for surgical intervention.  9/21 Cortrak placed  Patient is currently intubated on ventilator support MV: 12 L/min Temp (24hrs), Avg:99.3 F (37.4 C), Min:98.6 F (37 C), Max:99.9 F (37.7 C)  MAP goal: 65  Pt s/p CABG x 4 and placement of RVAD using CentriMag pump 9/20. Vital 1.5 formula infusing at goal rate of 50 ml/hr via Cortrak feeding tube. PS 30 ml TID. Medications reviewed and include Lasix, Precedex and Reglan. Labs reviewed. K 3.2 (L). BUN 67 (H). Cr 1.69 (H). CBG's 206-223-216.  Diet Order:  Diet NPO time specified  Skin:  Reviewed, no issues  Last BM:  10/4   Intake/Output Summary (Last 24 hours) at 09/12/17 1305 Last data filed at 09/12/17 1300  Gross per 24 hour  Intake          3909.23 ml  Output             6645 ml  Net         -2735.77 ml   Height:   Ht Readings from Last 1 Encounters:  09/12/17 '5\' 11"'$  (1.803 m)   Weight:   Wt Readings from Last 1 Encounters:  09/12/17 200 lb 13.4 oz (91.1 kg)  Admit wt         200 lb (90.7 kg)  Ideal Body Weight:  78.1 kg  BMI:  Body mass index is 28.01 kg/m.  Estimated Nutritional Needs:   Kcal:  2115  Protein:  120-135 gm  Fluid:  per MD  EDUCATION NEEDS:   No education needs identified at this time  Arthur Holms, RD, LDN Pager #: [phone] After-Hours Pager #: [phone]

## 2017-09-12 NOTE — Progress Notes (Signed)
Patient back on full support due to desating, increased BP, increased WOB.

## 2017-09-12 NOTE — Progress Notes (Signed)
Dr. Kendrick Fries notified of pt having another episode of desaturation, requiring increased oxygen along with increased anxiety. Orders received for RT to lavage and suction patient.  Will notify RT and continue to monitor pt closely.

## 2017-09-12 NOTE — Progress Notes (Signed)
ANTICOAGULATION CONSULT NOTE - Follow-Up Consult  Pharmacy Consult for heparin Indication: atrial fibrillation  Allergies  Allergen Reactions  . Aspirin Other (See Comments)    Stomach upset  . Latex Rash  . Oxycodone-Acetaminophen Itching    Patient Measurements: Height:  (180.3 cm) Weight: 200 lb 13.4 oz (91.1 kg) IBW/kg (Calculated) : 75.3 Heparin Dosing Weight: 94.1kg   Vital Signs: Temp: 99.9 F (37.7 C) (10/05 2045) Temp Source: Core (Comment) (10/05 1200) BP: 108/61 (10/05 2000) Pulse Rate: 73 (10/05 2045)  Labs:  Recent Labs  09/10/17 0232  09/11/17 0337 09/11/17 1600  09/12/17 0508 09/12/17 1327 09/12/17 1627 09/12/17 1958  HGB 8.2*  < > 8.5* 9.5*  --  9.5*  --  10.2*  --   HCT 24.2*  < > 26.4* 28.0*  --  29.6*  --  30.0*  --   PLT 274  --  301  --   --  463*  --   --   --   HEPARINUNFRC  --   --   --   --   < > 0.20* 0.17*  --  0.13*  CREATININE 1.78*  < > 1.73* 1.70*  --  1.69*  --  1.50*  --   < > = values in this interval not displayed.  Estimated Creatinine Clearance: 58.2 mL/min (A) (by C-G formula based on SCr of 1.5 mg/dL (H)).   Assessment: 53 yoM s/p IABP removal and Centrimag now with recurrent AFib to start heparin on heparin. Heparin level has been subtherapeutic. Heparin level is 0.13, which is subtherapeutic. No interruptions to the infusion. No signs/symptoms of bleeding. CBC low but stable.  Goal of Therapy:  Heparin level 0.25-0.35 units/ml Monitor platelets by anticoagulation protocol: Yes   Plan:  -Increase heparin to 1300 units/hr -Check 6-hr heparin level with AM labs -Monitor heparin level, CBC, S/Sx bleeding daily  Girard Cooter, PharmD Clinical Pharmacist  Phone: [phone] 09/12/2017

## 2017-09-12 NOTE — Progress Notes (Signed)
Patient ID: Clinton Harrington, male   DOB: Jun 15, 1951, 66 y.o.   MRN: [id number]        Advanced Heart Failure Rounding Note   Subjective:    Events CABG x4 IABP Centrimag 9/20    IABP removed 9/24.  Centrimag RVAD in place DC-CV successful 9/29  Centrimag removal 10/1  Epi weaned to 1 mcg and continues on milrinone 0.2 mcg , amio 30 mg per hour, and lasix drip 12 mg per hour.  Heparin gtt started.  Intubated. Follows commands slowly.   Left side seems weaker.    Blood CX pending from 09/10/2017   Swan numbers: CVP 7-8 PA 22/11 CO/CI  8/3.7 CO-OX 73%.   Echo (09/01/17): EF 65-70% with moderate LV hypertrophy, mildly dilated RV with moderately decreased systolic function, IVC dilated.   Objective:   Weight Range: 200 lb 13.4 oz (91.1 kg) Body mass index is 28.01 kg/m.   Vital Signs:   Temp:  [98.4 F (36.9 C)-99.9 F (37.7 C)] 98.6 F (37 C) (10/05 0645) Pulse Rate:  [73-118] 81 (10/05 0645) Resp:  [17-27] 19 (10/05 0645) BP: (88-156)/(50-125) 117/61 (10/05 0300) SpO2:  [90 %-100 %] 94 % (10/05 0645) Arterial Line BP: (93-206)/(44-73) 168/61 (10/05 0645) FiO2 (%):  [40 %] 40 % (10/05 0315) Weight:  [200 lb 13.4 oz (91.1 kg)] 200 lb 13.4 oz (91.1 kg) (10/05 0500) Last BM Date: 09/11/17  Weight change: Filed Weights   09/10/17 0444 09/11/17 0600 09/12/17 0500  Weight: 216 lb 4.3 oz (98.1 kg) 207 lb 7.3 oz (94.1 kg) 200 lb 13.4 oz (91.1 kg)    Intake/Output:   Intake/Output Summary (Last 24 hours) at 09/12/17 0709 Last data filed at 09/12/17 0654  Gross per 24 hour  Intake          4494.48 ml  Output             8420 ml  Net         -3925.52 ml      Physical Exam  CVP 7-8 General:  Intubated HEENT: ETT Neck: supple. no JVD. Carotids 2+ bilat; no bruits. No lymphadenopathy or thryomegaly appreciated. Cor: PMI nondisplaced. Regular rate & rhythm. No rubs, gallops or murmurs. Rand L subclavian central line.  Lungs: Rhonchi throughout.  Abdomen: soft,  nontender, nondistended. No hepatosplenomegaly. No bruits or masses. Good bowel sounds. Extremities: no cyanosis, clubbing, rash, R and LLE SCDs edema RUE A line  Neuro: Awake follows commands. Moves all 4 extremities L side seems weaker than right.  Skin : Foam dressing on buttocks GU: foley.     Telemetry   NSR PACs 60-80s personally reviewed.   Labs    CBC  Recent Labs  09/11/17 0337 09/11/17 1600 09/12/17 0508  WBC 12.3*  --  12.6*  HGB 8.5* 9.5* 9.5*  HCT 26.4* 28.0* 29.6*  MCV 89.5  --  89.7  PLT 301  --  614*   Basic Metabolic Panel  Recent Labs  09/10/17 0232  09/11/17 0337 09/11/17 1600 09/12/17 0508  NA 134*  < > 137 138 136  K 3.9  < > 3.2* 4.9 3.2*  CL 100*  < > 99* 100* 96*  CO2 25  --  29  --  27  GLUCOSE 223*  < > 183* 215* 222*  BUN 65*  < > 65* 68* 67*  CREATININE 1.78*  < > 1.73* 1.70* 1.69*  CALCIUM 7.2*  --  7.6*  --  8.1*  MG 2.6*  --   --   --  2.2  PHOS 4.3  --   --   --  3.3  < > = values in this interval not displayed. Liver Function Tests  Recent Labs  09/11/17 0337 09/12/17 0508  AST 74* 66*  ALT 27 29  ALKPHOS 87 88  BILITOT 0.9 0.7  PROT 6.3* 7.1  ALBUMIN 2.4* 2.4*   No results for input(s): LIPASE, AMYLASE in the last 72 hours. Cardiac Enzymes No results for input(s): CKTOTAL, CKMB, CKMBINDEX, TROPONINI in the last 72 hours.  BNP: BNP (last 3 results)  Recent Labs  08/23/17 1927  BNP 200.0*    ProBNP (last 3 results) No results for input(s): PROBNP in the last 8760 hours.   D-Dimer No results for input(s): DDIMER in the last 72 hours. Hemoglobin A1C No results for input(s): HGBA1C in the last 72 hours. Fasting Lipid Panel No results for input(s): CHOL, HDL, LDLCALC, TRIG, CHOLHDL, LDLDIRECT in the last 72 hours. Thyroid Function Tests No results for input(s): TSH, T4TOTAL, T3FREE, THYROIDAB in the last 72 hours.  Invalid input(s): FREET3  Other results:   Imaging    No results  found.   Medications:     Scheduled Medications: . aspirin EC  325 mg Oral Daily   Or  . aspirin  324 mg Per Tube Daily  . atorvastatin  80 mg Per Tube q1800  . bisacodyl  10 mg Oral Daily   Or  . bisacodyl  10 mg Rectal Daily  . brimonidine  1 drop Right Eye BID  . chlorhexidine gluconate (MEDLINE KIT)  15 mL Mouth Rinse BID  . Chlorhexidine Gluconate Cloth  6 each Topical Daily  . docusate sodium  200 mg Oral Daily  . hydrocortisone sod succinate (SOLU-CORTEF) inj  50 mg Intravenous Daily  . insulin aspart  0-24 Units Subcutaneous Q4H  . insulin glargine  30 Units Subcutaneous BID  . mouth rinse  15 mL Mouth Rinse 10 times per day  . metoCLOPramide (REGLAN) injection  10 mg Intravenous Q6H  . metoprolol tartrate  12.5 mg Oral BID   Or  . metoprolol tartrate  12.5 mg Per Tube BID  . mupirocin cream   Topical BID  . pantoprazole (PROTONIX) IV  40 mg Intravenous Q24H  . sodium chloride flush  10-40 mL Intracatheter Q12H  . sodium chloride flush  3 mL Intravenous Q12H  . spironolactone  12.5 mg Per Tube Daily    Infusions: . sodium chloride    . sodium chloride 10 mL/hr at 09/12/17 0654  . amiodarone 30 mg/hr (09/12/17 0645)  . dexmedetomidine (PRECEDEX) IV infusion 0.7 mcg/kg/hr (09/12/17 0300)  . EPINEPHrine 4 mg in dextrose 5% 250 mL infusion (16 mcg/mL) 1 mcg/min (09/12/17 0647)  . feeding supplement (VITAL 1.5 CAL) 1,000 mL (09/12/17 0300)  . fentaNYL infusion INTRAVENOUS 100 mcg/hr (09/12/17 0300)  . fluconazole (DIFLUCAN) IV Stopped (09/11/17 0959)  . furosemide (LASIX) infusion 12 mg/hr (09/12/17 0300)  . heparin 1,200 Units/hr (09/12/17 0656)  . lactated ringers 10 mL/hr at 09/12/17 0300  . midazolam (VERSED) infusion Stopped (09/12/17 0600)  . milrinone 0.2 mcg/kg/min (09/12/17 0300)  . norepinephrine (LEVOPHED) Adult infusion Stopped (09/10/17 1837)  . piperacillin-tazobactam (ZOSYN)  IV 3.375 g (09/12/17 0700)  . potassium chloride    . vancomycin Stopped  (09/11/17 2225)    PRN Medications: acetaminophen (TYLENOL) oral liquid 160 mg/5 mL, fentaNYL, metoprolol tartrate, midazolam, ondansetron (ZOFRAN) IV, oxyCODONE, sodium chloride flush, sodium chloride flush, traMADol    Patient Profile  Mr Kendzierski is a 66 year old with a history of DMII, smoker, CAD S/P PCI RCA 20 years ago, HTN, PAD, and hyperlipidemia admitted with CP and inferior MI with RV infarction.  He underwent LHC with IABP. After optimized, underwent CABG x4 centrimag for RV infarct on 9/20 and continued IABP.   Assessment/Plan   1. Post-cardiotomy cardiogenic shock: Primarily RV failure from inferior infarct with RV involvement.  Repeat echo on 9/24 showed vigorous LV systolic function with moderate RV dysfunction.  Now s/p CABG and Centrimag for RV support on 9/20. IABP removed 9/24 with good LV function.  Centrimag out 10/1. Currently on epi 1 mcg and milrinone 0.2 mcg.  CO-OX 73%. Good I/Os, back to pre-op weight.  CVP 8.   - Decrease milrinone to 0.1 today.  - Volume status improving. Cut back lasix drip to 6 mg per hour.  - Increase spiro to 25 mg daily. Supplement K.   2. CAD: Inferior MI with RV involvement at presentation, now s/p CABG.   - He is getting ASA.   - Continue statin.  3. Atrial fibrillation/flutter: DCCV 9/29, now remains in NSR.  - NSR with PACs today. Continue amio 30 mg per hour.  - On heparin drip (had DCCV this admission from atrial fibrillation).   Eventually transition to Eliquis versus warfarin.   4. AKI: Creatinine unchanged 1.69. Watch closely with diuresis.  5. Diabetes: Difficult to control, appreciate help from diabetes coordinator. No change.  6. Hyponatremia: Resolved     7. ID: Coag negative Staph on 9/28 blood culture, possible HCAP versus contaminant.  Afebrile. WBC 12.6.  - Blood CX 10/3- NGTD.   - Covering for HCAP with vancomycin/Zosyn => LLL infiltrate on CXR.  8. Anemia: Received 2 uPRBCs 09/04/17. 4 units PRBCs on 9/29. Received  2U PRBCs 10/1.  Hgb stable 9.5. Stable.  9. Coffee grounds in NGT- resolved.  - Continue IV protonix 10. L Buttock - Suspected Deep Tissue Injury Pressure Ulcer . This may evolve to full thickness tissue loss. Continue foam dressing and reposition R-L every 2 hours. Consult WOC.  11. R Buttock- Stage 2 Pressure Ulcer. Continue foam dressing and reposition R-L every 2 hours.  12. Acute respiratory failure: Suspect left-sided HCAP, on broad spectrum antibiotics.  He has diuresed well.  If not extubated over weekend, will need trach.  13. Neuro: Weaker on left, ?CVA.  He is on heparin gtt post-DCCV.  Will get head CT, rule out hemorrhage.   Amy Clegg NP-C  7:09 AM   Patient seen with NP, agree with the above note.  CVP down to 8 with good diuresis.  He has been weaned to epinephrine 1, milrinone 0.1.  Remains intubated, weaned yesterday.  Suspect left-sided HCAP.  MRSE in prior blood cultures.  - Can decrease Lasix to 6 mg/hr today.  - Decrease milrinone to 0.1.   Left-sided weakness, will get CT head w/o contrast to rule out hemorrhage.   Continues on vancomycin/Zosyn for HCAP.  If not extubated over weekend, will need trach.   Loralie Champagne 09/12/2017 7:40 AM

## 2017-09-12 NOTE — Progress Notes (Signed)
During bedside rounds, Dr. Kendrick Fries notified of failed vent weaning attempt this morning and episode of desaturations requiring increased FIO2.  Will continue to monitor pt closely.

## 2017-09-12 NOTE — Progress Notes (Signed)
Dr. Shirlee Latch notified of episodes of desaturations this morning requiring increased oxygen and sedation to manage vent asynchrony.  Order received to wait to go to CT until patient is more stable.  Will continue to monitor pt closely.

## 2017-09-12 NOTE — Progress Notes (Signed)
RT transported patient from room 2H03 ro CT and back to room 2H03 with no complications. Vitals are stable and sats are 95%. RT will continue to monitor.

## 2017-09-12 NOTE — Progress Notes (Addendum)
ANTICOAGULATION CONSULT NOTE - Follow-Up Consult  Pharmacy Consult for heparin Indication: atrial fibrillation  Allergies  Allergen Reactions  . Aspirin Other (See Comments)    Stomach upset  . Latex Rash  . Oxycodone-Acetaminophen Itching    Patient Measurements: Height:  (180.3 cm) Weight: 200 lb 13.4 oz (91.1 kg) IBW/kg (Calculated) : 75.3 Heparin Dosing Weight: 94.1kg   Vital Signs: Temp: 99.7 F (37.6 C) (10/05 0300) Temp Source: Core (Comment) (10/05 0000) BP: 117/61 (10/05 0300) Pulse Rate: 76 (10/05 0300)  Labs:  Recent Labs  09/10/17 0232  09/11/17 0337 09/11/17 1600 09/11/17 2240 09/12/17 0508  HGB 8.2*  < > 8.5* 9.5*  --  9.5*  HCT 24.2*  < > 26.4* 28.0*  --  29.6*  PLT 274  --  301  --   --  463*  HEPARINUNFRC  --   --   --   --  0.27* 0.20*  CREATININE 1.78*  < > 1.73* 1.70*  --  1.69*  < > = values in this interval not displayed.  Estimated Creatinine Clearance: 51.6 mL/min (A) (by C-G formula based on SCr of 1.69 mg/dL (H)).    Assessment: 67 yoM s/p IABP removal and Centrimag now with recurrent AFib to start heparin on heparin. Heparin level slightly subtherapeutic at 0.20 this morning, CBC stable, no S/Sx bleeding per RN.  Goal of Therapy:  Heparin level 0.25-0.35 units/ml Monitor platelets by anticoagulation protocol: Yes   Plan:  -Increase heparin to 1150 units/hr -Check 6-hr heparin level -Monitor heparin level, CBC, S/Sx bleeding daily  Fredonia Highland, PharmD PGY-2 Cardiology Pharmacy Resident Pager: [phone] 09/12/2017

## 2017-09-13 ENCOUNTER — Inpatient Hospital Stay (HOSPITAL_COMMUNITY): Payer: PPO

## 2017-09-13 LAB — AEROBIC/ANAEROBIC CULTURE W GRAM STAIN (SURGICAL/DEEP WOUND)

## 2017-09-13 LAB — COOXEMETRY PANEL
Carboxyhemoglobin: 0.9 % (ref 0.5–1.5)
Methemoglobin: 1.1 % (ref 0.0–1.5)
O2 Saturation: 61.5 %
Total hemoglobin: 8.7 g/dL — ABNORMAL LOW (ref 12.0–16.0)

## 2017-09-13 LAB — HEPARIN LEVEL (UNFRACTIONATED)
Heparin Unfractionated: 0.13 IU/mL — ABNORMAL LOW (ref 0.30–0.70)
Heparin Unfractionated: 0.26 IU/mL — ABNORMAL LOW (ref 0.30–0.70)
Heparin Unfractionated: 0.45 IU/mL (ref 0.30–0.70)

## 2017-09-13 LAB — CBC
HCT: 28.3 % — ABNORMAL LOW (ref 39.0–52.0)
Hemoglobin: 8.9 g/dL — ABNORMAL LOW (ref 13.0–17.0)
MCH: 28.4 pg (ref 26.0–34.0)
MCHC: 31.4 g/dL (ref 30.0–36.0)
MCV: 90.4 fL (ref 78.0–100.0)
Platelets: 371 10*3/uL (ref 150–400)
RBC: 3.13 MIL/uL — ABNORMAL LOW (ref 4.22–5.81)
RDW: 15 % (ref 11.5–15.5)
WBC: 8.8 10*3/uL (ref 4.0–10.5)

## 2017-09-13 LAB — GLUCOSE, CAPILLARY
GLUCOSE-CAPILLARY: 187 mg/dL — AB (ref 65–99)
GLUCOSE-CAPILLARY: 193 mg/dL — AB (ref 65–99)
GLUCOSE-CAPILLARY: 207 mg/dL — AB (ref 65–99)
Glucose-Capillary: 134 mg/dL — ABNORMAL HIGH (ref 65–99)
Glucose-Capillary: 185 mg/dL — ABNORMAL HIGH (ref 65–99)
Glucose-Capillary: 233 mg/dL — ABNORMAL HIGH (ref 65–99)

## 2017-09-13 LAB — COMPREHENSIVE METABOLIC PANEL
ALT: 29 U/L (ref 17–63)
AST: 52 U/L — ABNORMAL HIGH (ref 15–41)
Albumin: 2.1 g/dL — ABNORMAL LOW (ref 3.5–5.0)
Alkaline Phosphatase: 76 U/L (ref 38–126)
Anion gap: 10 (ref 5–15)
BUN: 69 mg/dL — ABNORMAL HIGH (ref 6–20)
CO2: 28 mmol/L (ref 22–32)
Calcium: 7.8 mg/dL — ABNORMAL LOW (ref 8.9–10.3)
Chloride: 99 mmol/L — ABNORMAL LOW (ref 101–111)
Creatinine, Ser: 1.7 mg/dL — ABNORMAL HIGH (ref 0.61–1.24)
GFR calc Af Amer: 48 mL/min — ABNORMAL LOW (ref >60)
GFR calc non Af Amer: 41 mL/min — ABNORMAL LOW (ref >60)
Glucose, Bld: 192 mg/dL — ABNORMAL HIGH (ref 65–99)
Potassium: 3.4 mmol/L — ABNORMAL LOW (ref 3.5–5.1)
Sodium: 137 mmol/L (ref 135–145)
Total Bilirubin: 0.7 mg/dL (ref 0.3–1.2)
Total Protein: 6.6 g/dL (ref 6.5–8.1)

## 2017-09-13 LAB — POCT I-STAT, CHEM 8
BUN: 72 mg/dL — AB (ref 6–20)
CALCIUM ION: 1.12 mmol/L — AB (ref 1.15–1.40)
CHLORIDE: 101 mmol/L (ref 101–111)
CREATININE: 1.7 mg/dL — AB (ref 0.61–1.24)
GLUCOSE: 270 mg/dL — AB (ref 65–99)
HCT: 28 % — ABNORMAL LOW (ref 39.0–52.0)
Hemoglobin: 9.5 g/dL — ABNORMAL LOW (ref 13.0–17.0)
Potassium: 3.9 mmol/L (ref 3.5–5.1)
Sodium: 141 mmol/L (ref 135–145)
TCO2: 27 mmol/L (ref 22–32)

## 2017-09-13 LAB — TYPE AND SCREEN
ABO/RH(D): O POS
Antibody Screen: NEGATIVE

## 2017-09-13 LAB — AEROBIC/ANAEROBIC CULTURE (SURGICAL/DEEP WOUND): CULTURE: NO GROWTH

## 2017-09-13 MED ORDER — POTASSIUM CHLORIDE 10 MEQ/50ML IV SOLN
10.0000 meq | INTRAVENOUS | Status: AC
  Administered 2017-09-13 (×2): 10 meq via INTRAVENOUS
  Filled 2017-09-13 (×2): qty 50

## 2017-09-13 MED ORDER — INSULIN GLARGINE 100 UNIT/ML ~~LOC~~ SOLN
45.0000 [IU] | Freq: Two times a day (BID) | SUBCUTANEOUS | Status: DC
  Administered 2017-09-13: 45 [IU] via SUBCUTANEOUS
  Filled 2017-09-13 (×3): qty 0.45

## 2017-09-13 MED ORDER — DOCUSATE SODIUM 50 MG/5ML PO LIQD
200.0000 mg | Freq: Every day | ORAL | Status: DC
  Administered 2017-09-13 – 2017-09-19 (×6): 200 mg via ORAL
  Filled 2017-09-13 (×6): qty 20

## 2017-09-13 MED ORDER — VITAL 1.5 CAL PO LIQD
1000.0000 mL | ORAL | Status: DC
  Administered 2017-09-14: 1000 mL
  Filled 2017-09-13 (×4): qty 1000

## 2017-09-13 MED ORDER — FUROSEMIDE 10 MG/ML IJ SOLN
20.0000 mg | Freq: Three times a day (TID) | INTRAMUSCULAR | Status: DC
  Administered 2017-09-13 – 2017-09-15 (×7): 20 mg via INTRAVENOUS
  Filled 2017-09-13 (×7): qty 2

## 2017-09-13 MED ORDER — EPINEPHRINE PF 1 MG/ML IJ SOLN
0.0000 ug/min | INTRAVENOUS | Status: DC
  Administered 2017-09-13: 1.1 ug/min via INTRAVENOUS
  Filled 2017-09-13: qty 4

## 2017-09-13 MED ORDER — POTASSIUM CHLORIDE 10 MEQ/50ML IV SOLN
10.0000 meq | INTRAVENOUS | Status: AC
  Administered 2017-09-13 (×3): 10 meq via INTRAVENOUS
  Filled 2017-09-13 (×3): qty 50

## 2017-09-13 NOTE — Progress Notes (Signed)
ANTICOAGULATION CONSULT NOTE - Follow-Up Consult  Pharmacy Consult for heparin Indication: atrial fibrillation  Allergies  Allergen Reactions  . Aspirin Other (See Comments)    Stomach upset  . Latex Rash  . Oxycodone-Acetaminophen Itching    Patient Measurements: Height:  (180.3 cm) Weight: 200 lb 13.4 oz (91.1 kg) IBW/kg (Calculated) : 75.3 Heparin Dosing Weight: 94.1kg   Vital Signs: Temp: 100.2 F (37.9 C) (10/06 0500) BP: 111/66 (10/06 0500) Pulse Rate: 66 (10/06 0500)  Labs:  Recent Labs  09/11/17 0337 09/11/17 1600  09/12/17 0508 09/12/17 1327 09/12/17 1627 09/12/17 1958 09/13/17 0319  HGB 8.5* 9.5*  --  9.5*  --  10.2*  --  8.9*  HCT 26.4* 28.0*  --  29.6*  --  30.0*  --  28.3*  PLT 301  --   --  463*  --   --   --  371  HEPARINUNFRC  --   --   < > 0.20* 0.17*  --  0.13* 0.13*  CREATININE 1.73* 1.70*  --  1.69*  --  1.50*  --   --   < > = values in this interval not displayed.  Estimated Creatinine Clearance: 58.2 mL/min (A) (by C-G formula based on SCr of 1.5 mg/dL (H)).   Assessment: 19 yoM s/p IABP removal and Centrimag now with recurrent AFib to start heparin on heparin. Heparin level has been subtherapeutic.  Hep lvl low at 0.13 on 1300 units/hr  Goal of Therapy:  Heparin level 0.25-0.35 units/ml Monitor platelets by anticoagulation protocol: Yes   Plan:  -Increase heparin to 1450 units/hr -1200 HL -Monitor heparin level, CBC, S/Sx bleeding daily  Isaac Bliss, PharmD, BCPS, BCCCP Clinical Pharmacist Clinical phone for 09/13/2017 from 7a-3:30p: [phone] If after 3:30p, please call main pharmacy at: [phone] 09/13/2017 5:11 AM

## 2017-09-13 NOTE — Plan of Care (Deleted)
Problem: Activity: Goal: Risk for activity intolerance will decrease Outcome: Progressing Pt ambulating in room and in halls  Problem: Bowel/Gastric: Goal: Gastrointestinal status for postoperative course will improve Outcome: Progressing First Bm since surgery today  Problem: Respiratory: Goal: Respiratory status will improve Outcome: Progressing Pt is consistently doing IS and making good progress

## 2017-09-13 NOTE — Progress Notes (Signed)
CT surgery p.m. Rounds  Patient more awake moving all extremities Maintaining good urine output off Lasix drip Sinus rhythm Heparin to treat postop intermittent A. Fib Became tachypneic with pressure support weaning trials

## 2017-09-13 NOTE — Progress Notes (Signed)
PULMONARY / CRITICAL CARE MEDICINE   Name: Clinton Harrington MRN: [id number] DOB: [birth year]    ADMISSION DATE:  08/23/2017 CONSULTATION DATE:  08/30/2017  REFERRING MD:  Cornelius Moras  CHIEF COMPLAINT:  Acute respiratory failure with hypoxemia  BRIEF:  66 y/o male with CAD who had an MI on 9/15, had severe RV failure, was treated with CABG, IABP, eventual centrimag device placed.  Post operatively was weaned off of the RVAD device on 10/1, remains mechanically ventilated.  Currently being treated for LLL HCAP.  PAST MEDICAL HISTORY :  He  has a past medical history of Arthritis; Diabetes (HCC); Diabetes mellitus (Jan. 1999); High cholesterol; Hypertension; Myocardial infarct (HCC); Peripheral vascular disease (HCC); Seasonal allergies; Smoker; and Urine incontinence.   REVIEW OF SYSTEMS:   Cannot obtain due to intubation  SUBJECTIVE:  CT head unremarkable Moving left side purposefully now  VITAL SIGNS: BP (!) 143/64   Pulse 70   Temp 100.2 F (37.9 C)   Resp (!) 25   Ht  (1.803 m)   Wt 196 lb 13.9 oz (89.3 kg)   SpO2 97%   BMI 27.46 kg/m   HEMODYNAMICS: PAP: (16-32)/(8-21) 24/13 CVP:  [7 mmHg-18 mmHg] 12 mmHg CO:  [4.5 L/min-5.5 L/min] 5.5 L/min CI:  [2.1 L/min/m2-2.6 L/min/m2] 2.6 L/min/m2  VENTILATOR SETTINGS: Vent Mode: PRVC FiO2 (%):  [40 %-50 %] 40 % Set Rate:  [20 bmp] 20 bmp Vt Set:  [560 mL] 560 mL PEEP:  [5 cmH20] 5 cmH20 Plateau Pressure:  [15 cmH20-23 cmH20] 18 cmH20  INTAKE / OUTPUT: I/O last 3 completed shifts: In: 6035.6 [I.V.:3315.6; NG/GT:2070; IV Piggyback:650] Out: 40981 [Urine:7750; Emesis/NG output:2300; Chest Tube:120]  PHYSICAL EXAMINATION:  General:  In bed on vent HENT: NCAT ETT in place PULM: CTA B, vent supported breathing CV: RRR, no mgr, sternal wound dressing c/d/i GI: BS+, soft, nontender MSK: normal bulk and tone Neuro: awake on vent, follows commands bilaterally today, moves left hand and foot    LABS:  BMET  Recent  Labs Lab 09/11/17 0337  09/12/17 0508 09/12/17 1627 09/13/17 0319  NA 137  < > 136 140 137  K 3.2*  < > 3.2* 3.4* 3.4*  CL 99*  < > 96* 98* 99*  CO2 29  --  27  --  28  BUN 65*  < > 67* 60* 69*  CREATININE 1.73*  < > 1.69* 1.50* 1.70*  GLUCOSE 183*  < > 222* 251* 192*  < > = values in this interval not displayed.  Electrolytes  Recent Labs Lab 09/09/17 1514 09/10/17 0232 09/11/17 0337 09/12/17 0508 09/13/17 0319  CALCIUM  --  7.2* 7.6* 8.1* 7.8*  MG 3.0* 2.6*  --  2.2  --   PHOS  --  4.3  --  3.3  --     CBC  Recent Labs Lab 09/11/17 0337  09/12/17 0508 09/12/17 1627 09/13/17 0319  WBC 12.3*  --  12.6*  --  8.8  HGB 8.5*  < > 9.5* 10.2* 8.9*  HCT 26.4*  < > 29.6* 30.0* 28.3*  PLT 301  --  463*  --  371  < > = values in this interval not displayed.  Coag's  Recent Labs Lab 09/07/17 0206 09/08/17 0500 09/08/17 1145  APTT 74* 69* 39*  INR 1.56 1.61 1.58    Sepsis Markers No results for input(s): LATICACIDVEN, PROCALCITON, O2SATVEN in the last 168 hours.  ABG  Recent Labs Lab 09/11/17 0335 09/12/17 0327 09/13/17 0355  PHART 7.427 7.454* 7.464*  PCO2ART 42.5 41.6 39.4  PO2ART 94.0 85.6 120*    Liver Enzymes  Recent Labs Lab 09/11/17 0337 09/12/17 0508 09/13/17 0319  AST 74* 66* 52*  ALT ALKPHOS 87 88 76  BILITOT 0.9 0.7 0.7  ALBUMIN 2.4* 2.4* 2.1*    Cardiac Enzymes No results for input(s): TROPONINI, PROBNP in the last 168 hours.  Glucose  Recent Labs Lab 09/12/17 1138 09/12/17 1624 09/12/17 1954 09/12/17 2343 09/13/17 0406 09/13/17 0746  GLUCAP 203* 224* 175* 187* 193* 207*    Imaging Ct Head Wo Contrast  Result Date: 09/12/2017 CLINICAL DATA:  Left-sided weakness and gaze EXAM: CT HEAD WITHOUT CONTRAST TECHNIQUE: Contiguous axial images were obtained from the base of the skull through the vertex without intravenous contrast. COMPARISON:  None. FINDINGS: Brain: No acute territorial infarction, hemorrhage or  intracranial mass is visualized. Mild small vessel ischemic changes of the white matter. Moderate atrophy. Ventricle size within normal limits given atrophy Vascular: No hyperdense vessels.  Carotid artery calcification Skull: Fluid within the bilateral mastoid air cells and middle ears. No fracture Sinuses/Orbits: Postsurgical changes of the maxillary and ethmoid sinuses. Scattered mucosal opacification within the ethmoid, sphenoid, and maxillary sinuses. No acute orbital abnormality. Other: None IMPRESSION: 1. No definite CT evidence for acute intracranial abnormality. MRI follow-up as indicated. 2. Atrophy and small vessel ischemic changes of the white matter 3. Fluid within the bilateral mastoid air cells and middle years. Electronically Signed   By: Jasmine Pang M.D.   On: 09/12/2017 17:51   Dg Chest Port 1 View  Result Date: 09/13/2017 CLINICAL DATA:  Followup ventilator support and bilateral chest tubes. EXAM: PORTABLE CHEST 1 VIEW COMPARISON:  09/12/2017. FINDINGS: Endotracheal tube tip 5 cm above the carina. Nasogastric tube enters the abdomen. Central line tip in the SVC 3 cm above the right atrium. Bilateral chest tubes in place. No pneumothorax. There is lower lobe atelectasis, left worse than right. Small amount of pleural fluid on the left. IMPRESSION: Lines and tubes well positioned. No pneumothorax. Persistent lower lobe atelectasis left worse than right. Electronically Signed   By: Paulina Fusi M.D.   On: 09/13/2017 07:29   10/6 CXR images indepedently reviewed,   CULTURES: 9/22 sputum>>>ng 9/24>> Sputum>>ng 9/24 Blood>>NEG  9/24 Urine>>ng 9/28 BC x 2 >coag negative staph 1/4 10/2>> respiratory flora, candida albicans  ANTIBIOTICS: zinacef >>>9/22- 9/24 ceftaz 9/22>>>9/28 Vanc 9/20>>  Zosyn 9/28 >>  SIGNIFICANT EVENTS: 08/23/2017 - admit 9/22 - intubated Cath cabg RV assist device 9/23 - On epi/levo/RVAD/vent.  CI 1.8 despite all interventions 9/24>> Epi, Levo,  milrinine,amio,insulin gtt, Lasix gtt, Heparin, Fent, Versed 9/26 - Remains sedated on fent/ versed drips RVAD flows down to 3200 Good UO on lasix gtt @ 15 9/27 - 40% fio2, peep 8. RPM on RVAD turned down. Stil on levophed, milrinone, epi ggtt and heparin.admio gtt. Making urine. Has chest draines. Good urine outoput. Heparin on hold briefly before resumptions due to bleed in groin site. Getting 2 UI PRBC 9/30 mucous plugging requiring multiple lavages 10/1 RV assist device  removed  in the OR 2/2 bleeding 1-0/2 - Stable off RVAD, remains on levo, vaso,Epi, milrinone  STUDIES: 10/6 CT head > NAICP, may need MRI to assess further  Lines/tubes: 10/1 R subclavian CVL >   DISCUSSION: 66 y/o male s/p MI, RV failure, CABG, centrimag RV support deviced (now weaned off) who remains intubated for respiratory failure.  Currently being treated for HCAP.  ASSESSMENT / PLAN:  PULMONARY A: Acute respiratory failure with hypoxemia > improving HCAP P:   Full mechanical vent support, but wean vent today, SBT now VAP prevention Daily WUA/SBT Would wean over weekend, if hemodynamics favor then consider extubation on 10/8  CARDIOVASCULAR A:  Cardiogenic shock > improving CAD, s/p MI and CABG RV failure P:  Vasopressors/arrythmia meds per TCTS/cardiology Tele  RENAL A:   AKI> stable Cr around 1.6 P:   Monitor BMET and UOP Replace electrolytes as needed Lasix per TCTS  GASTROINTESTINAL A:   No acute issues P:   Continue tube feeding  HEMATOLOGIC A:   No acute issues P:  Monitor for bleeding  INFECTIOUS A:   HCAP, temp up 10/6 but WBC down Coag neg staph in blood> most likely contaminant P:   Monitor for fever   ENDOCRINE A:   DM2, persistent hyperglycemia   P:   Continue SSI Continue glargine to 35 U BID  NEUROLOGIC A:   10/5 New left sided weakness> resolved as of my exam 10/6 P:   Would not MRI given risk with all his vasopressors/vent support Continue  precedex, RASS goal -1 to -2  FAMILY  - Updates:  None bedside  My cc time 31 minutes  Heber , MD [facility] Pager: [phone] Cell: [phone] After 3pm or if no response, call [phone]    09/13/2017, 8:26 AM

## 2017-09-13 NOTE — Progress Notes (Signed)
Patient ID: Clinton Harrington, male   DOB: 1951/03/02, 66 y.o.   MRN: [id number]        Advanced Heart Failure Rounding Note   Subjective:    Events CABG x4 IABP Centrimag 9/20    IABP removed 9/24.  Centrimag RVAD in place DC-CV successful 9/29  Centrimag removal 10/1  Remains on epi 0.3. Now weaning to off.  On milrinone 0.125. Yesterday not moving left side well. Head CT negative for CVA. Apparently was responding and moving all 4s this am. Currently not responsive on vent. Weaning from vent.  On FiO2 40%  Blood CX NGTD from 09/10/2017   Swan numbers: CVP 10 PA 23/12 CO/CI  5.5/2.6 CO-OX 62%   Echo (09/01/17): EF 65-70% with moderate LV hypertrophy, mildly dilated RV with moderately decreased systolic function, IVC dilated.   Objective:   Weight Range: 89.3 kg (196 lb 13.9 oz) Body mass index is 27.46 kg/m.   Vital Signs:   Temp:  [98.8 F (37.1 C)-100.6 F (38.1 C)] 98.8 F (37.1 C) (10/06 1143) Pulse Rate:  [62-89] 72 (10/06 1200) Resp:  [17-27] 27 (10/06 1200) BP: (84-177)/(48-84) 118/65 (10/06 1200) SpO2:  [96 %-100 %] 97 % (10/06 1200) Arterial Line BP: (87-191)/(37-74) 113/51 (10/06 1200) FiO2 (%):  [40 %-50 %] 40 % (10/06 1200) Weight:  [89.3 kg (196 lb 13.9 oz)] 89.3 kg (196 lb 13.9 oz) (10/06 0500) Last BM Date: 09/11/17  Weight change: Filed Weights   09/11/17 0600 09/12/17 0500 09/13/17 0500  Weight: 94.1 kg (207 lb 7.3 oz) 91.1 kg (200 lb 13.4 oz) 89.3 kg (196 lb 13.9 oz)    Intake/Output:   Intake/Output Summary (Last 24 hours) at 09/13/17 1223 Last data filed at 09/13/17 1200  Gross per 24 hour  Intake          3843.22 ml  Output             4990 ml  Net         -1146.78 ml      Physical Exam   General:  Intubated sedated. Not responsive HEENT: ETT. Poor dentition  Neck: supple. RIJ introducer  Carotids 2+ bilat; no bruits. No lymphadenopathy or thryomegaly appreciated. Cor: PMI nondisplaced. Regular rate & rhythm. Distant  HS Lungs: mild rhonchi anteriorly  Abdomen: soft, nontender, nondistended. No hepatosplenomegaly. No bruits or masses. Good bowel sounds. Extremities: no cyanosis, clubbing, rash, trace edema Neuro: sedated non-repsonsive   Telemetry   NSR PACs 70s personally reviewed.   Labs    CBC  Recent Labs  09/12/17 0508 09/12/17 1627 09/13/17 0319  WBC 12.6*  --  8.8  HGB 9.5* 10.2* 8.9*  HCT 29.6* 30.0* 28.3*  MCV 89.7  --  90.4  PLT 463*  --  263   Basic Metabolic Panel  Recent Labs  09/12/17 0508 09/12/17 1627 09/13/17 0319  NA 136 140 137  K 3.2* 3.4* 3.4*  CL 96* 98* 99*  CO2 27  --  28  GLUCOSE 222* 251* 192*  BUN 67* 60* 69*  CREATININE 1.69* 1.50* 1.70*  CALCIUM 8.1*  --  7.8*  MG 2.2  --   --   PHOS 3.3  --   --    Liver Function Tests  Recent Labs  09/12/17 0508 09/13/17 0319  AST 66* 52*  ALT 29 29  ALKPHOS 88 76  BILITOT 0.7 0.7  PROT 7.1 6.6  ALBUMIN 2.4* 2.1*   No results for input(s): LIPASE, AMYLASE in the  last 72 hours. Cardiac Enzymes No results for input(s): CKTOTAL, CKMB, CKMBINDEX, TROPONINI in the last 72 hours.  BNP: BNP (last 3 results)  Recent Labs  08/23/17 1927  BNP 200.0*    ProBNP (last 3 results) No results for input(s): PROBNP in the last 8760 hours.   D-Dimer No results for input(s): DDIMER in the last 72 hours. Hemoglobin A1C No results for input(s): HGBA1C in the last 72 hours. Fasting Lipid Panel No results for input(s): CHOL, HDL, LDLCALC, TRIG, CHOLHDL, LDLDIRECT in the last 72 hours. Thyroid Function Tests No results for input(s): TSH, T4TOTAL, T3FREE, THYROIDAB in the last 72 hours.  Invalid input(s): FREET3  Other results:   Imaging    Ct Head Wo Contrast  Result Date: 09/12/2017 CLINICAL DATA:  Left-sided weakness and gaze EXAM: CT HEAD WITHOUT CONTRAST TECHNIQUE: Contiguous axial images were obtained from the base of the skull through the vertex without intravenous contrast. COMPARISON:  None.  FINDINGS: Brain: No acute territorial infarction, hemorrhage or intracranial mass is visualized. Mild small vessel ischemic changes of the white matter. Moderate atrophy. Ventricle size within normal limits given atrophy Vascular: No hyperdense vessels.  Carotid artery calcification Skull: Fluid within the bilateral mastoid air cells and middle ears. No fracture Sinuses/Orbits: Postsurgical changes of the maxillary and ethmoid sinuses. Scattered mucosal opacification within the ethmoid, sphenoid, and maxillary sinuses. No acute orbital abnormality. Other: None IMPRESSION: 1. No definite CT evidence for acute intracranial abnormality. MRI follow-up as indicated. 2. Atrophy and small vessel ischemic changes of the white matter 3. Fluid within the bilateral mastoid air cells and middle years. Electronically Signed   By: Donavan Foil M.D.   On: 09/12/2017 17:51   Dg Chest Port 1 View  Result Date: 09/13/2017 CLINICAL DATA:  Followup ventilator support and bilateral chest tubes. EXAM: PORTABLE CHEST 1 VIEW COMPARISON:  09/12/2017. FINDINGS: Endotracheal tube tip 5 cm above the carina. Nasogastric tube enters the abdomen. Central line tip in the SVC 3 cm above the right atrium. Bilateral chest tubes in place. No pneumothorax. There is lower lobe atelectasis, left worse than right. Small amount of pleural fluid on the left. IMPRESSION: Lines and tubes well positioned. No pneumothorax. Persistent lower lobe atelectasis left worse than right. Electronically Signed   By: Nelson Chimes M.D.   On: 09/13/2017 07:29     Medications:     Scheduled Medications: . aspirin EC  325 mg Oral Daily   Or  . aspirin  324 mg Per Tube Daily  . atorvastatin  80 mg Per Tube q1800  . bisacodyl  10 mg Oral Daily   Or  . bisacodyl  10 mg Rectal Daily  . brimonidine  1 drop Right Eye BID  . chlorhexidine gluconate (MEDLINE KIT)  15 mL Mouth Rinse BID  . Chlorhexidine Gluconate Cloth  6 each Topical Daily  . docusate  200 mg  Oral Daily  . furosemide  20 mg Intravenous Q8H  . hydrocortisone sod succinate (SOLU-CORTEF) inj  50 mg Intravenous Daily  . insulin aspart  0-24 Units Subcutaneous Q4H  . insulin glargine  35 Units Subcutaneous BID  . mouth rinse  15 mL Mouth Rinse 10 times per day  . metoCLOPramide (REGLAN) injection  10 mg Intravenous Q6H  . mupirocin cream   Topical BID  . pantoprazole (PROTONIX) IV  40 mg Intravenous Q24H  . sodium chloride flush  10-40 mL Intracatheter Q12H  . sodium chloride flush  3 mL Intravenous Q12H  . spironolactone  25 mg Per Tube Daily    Infusions: . sodium chloride    . sodium chloride Stopped (09/12/17 1352)  . amiodarone 30 mg/hr (09/13/17 1200)  . dexmedetomidine (PRECEDEX) IV infusion 1 mcg/kg/hr (09/13/17 1200)  . epinephrine Stopped (09/13/17 1147)  . feeding supplement (VITAL 1.5 CAL) 1,000 mL (09/13/17 1200)  . fentaNYL infusion INTRAVENOUS 60 mcg/hr (09/13/17 1200)  . fluconazole (DIFLUCAN) IV Stopped (09/13/17 1054)  . heparin 1,450 Units/hr (09/13/17 1200)  . lactated ringers 10 mL/hr at 09/13/17 1200  . milrinone 0.125 mcg/kg/min (09/13/17 1200)  . piperacillin-tazobactam (ZOSYN)  IV Stopped (09/13/17 0932)    PRN Medications: acetaminophen (TYLENOL) oral liquid 160 mg/5 mL, fentaNYL, metoprolol tartrate, midazolam, ondansetron (ZOFRAN) IV, oxyCODONE, sodium chloride flush, sodium chloride flush    Patient Profile   Mr Tift is a 66 year old with a history of DMII, smoker, CAD S/P PCI RCA 20 years ago, HTN, PAD, and hyperlipidemia admitted with CP and inferior MI with RV infarction.  He underwent LHC with IABP. After optimized, underwent CABG x4 centrimag for RV infarct on 9/20 and continued IABP.   Assessment/Plan   1. Post-cardiotomy cardiogenic shock: Primarily RV failure from inferior infarct with RV involvement.  Repeat echo on 9/24 showed vigorous LV systolic function with moderate RV dysfunction.  Now s/p CABG and Centrimag for RV support  on 9/20. IABP removed 9/24 with good LV function.  Centrimag out 10/1. Currently on epi 1 mcg and milrinone 0.2 mcg.  CO-OX 62%. Good I/Os, weight down to 196 (below pre-op weight) - Stop epi. Continue milrinone today - Volume status looks good. Now on bolus lasix. Can adjust as needed. Goal CVP 8-12 - Continue spiro to 25 mg daily. Supplement K.   2. CAD: Inferior MI with RV involvement at presentation, now s/p CABG.   - He is getting ASA.   - Continue statin.  3. Atrial fibrillation/flutter: DCCV 9/29, now remains in NSR.  - NSR with PACs today. Continue amio 30 mg per hour.  - On heparin drip (had DCCV this admission from atrial fibrillation).   Eventually transition to Eliquis versus warfarin.   - D/w PharmD 4. AKI:  -Creatinine unchanged 1.70 . Likely new baseline  5. Acute respiratory failure:  -Suspect left-sided HCAP, on broad spectrum antibiotics.  - Suspect will need tach on Monday if we can't get him extubated tomorrow  6. Neuro:  -? L-sided weaker -Head CT 10/5 negative.  -Hard to assess currently with sedation  7. ID: Coag negative Staph on 9/28 blood culture, possible HCAP versus contaminant.  Afebrile. WBC 12.6.  - Blood CX 10/3- NGTD.   - Covering for HCAP with vancomycin/Zosyn => LLL infiltrate on CXR.  8. Anemia:  -Received 2 uPRBCs 09/04/17. 4 units PRBCs on 9/29. Received 2U PRBCs 10/1.  Hgb 8.9 today. 10. Sacral decubiti  - WOC following 11. Diabetes: Difficult to control, appreciate help from diabetes coordinator. No change.   CRITICAL CARE Performed by: Glori Bickers  Total critical care time: 35 minutes  Critical care time was exclusive of separately billable procedures and treating other patients.  Critical care was necessary to treat or prevent imminent or life-threatening deterioration.  Critical care was time spent personally by me (independent of midlevel providers or residents) on the following activities: development of treatment plan with  patient and/or surrogate as well as nursing, discussions with consultants, evaluation of patient's response to treatment, examination of patient, obtaining history from patient or surrogate, ordering and performing treatments and  interventions, ordering and review of laboratory studies, ordering and review of radiographic studies, pulse oximetry and re-evaluation of patient's condition.    Glori Bickers MD 09/13/2017 12:23 PM

## 2017-09-13 NOTE — Progress Notes (Signed)
5 Days Post-Op Procedure(s) (LRB): REMOVAL OF R-VENTRICULAR ASSIST DEVICE WITH CARDIOPULMONARY BYPASS (N/A) TRANSESOPHAGEAL ECHOCARDIOGRAM (TEE) (N/A) Subjective: Moving L side- head CT negative NSR CXR better- PS wean and if not extubated by mon do bedside trach after heparin stopped Hemodynamics great-DC swan Objective:Vital signs in last 24 hours: Temp:  [99 F (37.2 C)-100.4 F (38 C)] 100.2 F (37.9 C) (10/06 0900) Pulse Rate:  [62-89] 66 (10/06 0900) Cardiac Rhythm: Normal sinus rhythm (10/06 0800) Resp:  [16-29] 20 (10/06 0900) BP: (84-177)/(47-84) 108/56 (10/06 0900) SpO2:  [84 %-100 %] 97 % (10/06 0900) Arterial Line BP: (87-191)/(37-74) 110/50 (10/06 0900) FiO2 (%):  [40 %-50 %] 40 % (10/06 0803) Weight:  [196 lb 13.9 oz (89.3 kg)] 196 lb 13.9 oz (89.3 kg) (10/06 0500)  Hemodynamic parameters for last 24 hours: PAP: (16-32)/(8-21) 23/12 CVP:  [7 mmHg-18 mmHg] 11 mmHg CO:  [4.5 L/min-5.5 L/min] 5.5 L/min CI:  [2.1 L/min/m2-2.6 L/min/m2] 2.6 L/min/m2  Intake/Output from previous day: 10/05 0701 - 10/06 0700 In: 3504.4 [I.V.:1934.4; NG/GT:1270; IV Piggyback:300] Out: 1610 [Urine:4285; Emesis/NG output:1250; Chest Tube:40] Intake/Output this shift: Total I/O In: 536.1 [I.V.:246.1; NG/GT:240; IV Piggyback:50] Out: 280 [Urine:180; Emesis/NG output:100]       Exam    General- alert and comfortable   Lungs- clear without rales, wheezes   Cor- regular rate and rhythm, no murmur , gallop   Abdomen- soft, non-tender   Extremities - warm, non-tender, minimal edema   Neuro- oriented, appropriate, no focal weakness   Lab Results:  Recent Labs  09/12/17 0508 09/12/17 1627 09/13/17 0319  WBC 12.6*  --  8.8  HGB 9.5* 10.2* 8.9*  HCT 29.6* 30.0* 28.3*  PLT 463*  --  371   BMET:  Recent Labs  09/12/17 0508 09/12/17 1627 09/13/17 0319  NA 136 140 137  K 3.2* 3.4* 3.4*  CL 96* 98* 99*  CO2 27  --  28  GLUCOSE 222* 251* 192*  BUN 67* 60* 69*  CREATININE  1.69* 1.50* 1.70*  CALCIUM 8.1*  --  7.8*    PT/INR: No results for input(s): LABPROT, INR in the last 72 hours. ABG    Component Value Date/Time   PHART 7.464 (H) 09/13/2017 0355   HCO3 27.9 09/13/2017 0355   TCO2 31 09/12/2017 1627   ACIDBASEDEF 0.5 09/10/2017 0325   O2SAT 61.5 09/13/2017 0432   CBG (last 3)   Recent Labs  09/12/17 2343 09/13/17 0406 09/13/17 0746  GLUCAP 187* 193* 207*    Assessment/Plan: S/P Procedure(s) (LRB): REMOVAL OF R-VENTRICULAR ASSIST DEVICE WITH CARDIOPULMONARY BYPASS (N/A) TRANSESOPHAGEAL ECHOCARDIOGRAM (TEE) (N/A) Diuresis Diabetes control PS weaning  Heparin for afib- start coumadin after trach   LOS: 21 days    Kathlee Nations Trigt III 09/13/2017

## 2017-09-13 NOTE — Progress Notes (Signed)
ANTICOAGULATION CONSULT NOTE - Follow-Up Consult  Pharmacy Consult for heparin Indication: atrial fibrillation  Allergies  Allergen Reactions  . Aspirin Other (See Comments)    Stomach upset  . Latex Rash  . Oxycodone-Acetaminophen Itching    Patient Measurements: Height:  (180.3 cm) Weight: 196 lb 13.9 oz (89.3 kg) IBW/kg (Calculated) : 75.3 Heparin Dosing Weight: 94.1kg   Vital Signs: Temp: 99.7 F (37.6 C) (10/06 2000) Temp Source: Oral (10/06 2000) BP: 125/66 (10/06 2000) Pulse Rate: 75 (10/06 2000)  Labs:  Recent Labs  09/11/17 0337  09/12/17 0508  09/12/17 1627  09/13/17 0319 09/13/17 1210 09/13/17 1554 09/13/17 1950  HGB 8.5*  < > 9.5*  --  10.2*  --  8.9*  --  9.5*  --   HCT 26.4*  < > 29.6*  --  30.0*  --  28.3*  --  28.0*  --   PLT 301  --  463*  --   --   --  371  --   --   --   HEPARINUNFRC  --   < > 0.20*  < >  --   < > 0.13* 0.26*  --  0.45  CREATININE 1.73*  < > 1.69*  --  1.50*  --  1.70*  --  1.70*  --   < > = values in this interval not displayed.  Estimated Creatinine Clearance: 47.4 mL/min (A) (by C-G formula based on SCr of 1.7 mg/dL (H)).   Assessment: 66 yoM s/p IABP removal and Centrimag now with recurrent AFib on heparin (s/p DCCV on 9/29). Plans noted for oral AC after trach. -heparin level= 0.45 and at goal   Goal of Therapy:  Heparin level= 0.35- 0.5 Monitor platelets by anticoagulation protocol: Yes   Plan:  -No heparin changes needed -Monitor heparin level, CBC  Harland German, Pharm D 09/13/2017 9:06 PM

## 2017-09-13 NOTE — Progress Notes (Signed)
ANTICOAGULATION CONSULT NOTE - Follow-Up Consult  Pharmacy Consult for heparin Indication: atrial fibrillation  Allergies  Allergen Reactions  . Aspirin Other (See Comments)    Stomach upset  . Latex Rash  . Oxycodone-Acetaminophen Itching    Patient Measurements: Height:  (180.3 cm) Weight: 196 lb 13.9 oz (89.3 kg) IBW/kg (Calculated) : 75.3 Heparin Dosing Weight: 94.1kg   Vital Signs: Temp: 98.8 F (37.1 C) (10/06 1143) Temp Source: Core (Comment) (10/06 0800) BP: 118/65 (10/06 1200) Pulse Rate: 72 (10/06 1200)  Labs:  Recent Labs  09/11/17 0337  09/12/17 0508  09/12/17 1627 09/12/17 1958 09/13/17 0319 09/13/17 1210  HGB 8.5*  < > 9.5*  --  10.2*  --  8.9*  --   HCT 26.4*  < > 29.6*  --  30.0*  --  28.3*  --   PLT 301  --  463*  --   --   --  371  --   HEPARINUNFRC  --   < > 0.20*  < >  --  0.13* 0.13* 0.26*  CREATININE 1.73*  < > 1.69*  --  1.50*  --  1.70*  --   < > = values in this interval not displayed.  Estimated Creatinine Clearance: 47.4 mL/min (A) (by C-G formula based on SCr of 1.7 mg/dL (H)).   Assessment: 37 yoM s/p IABP removal and Centrimag now with recurrent AFib to start on heparin. Heparin level remains subtherapeutic this morning - per TCTS okay to increase goal to 0.35-0.5. CBC stable, CT head negative.  Goal of Therapy:  Heparin level 0.25-0.35 units/ml Monitor platelets by anticoagulation protocol: Yes   Plan:  -Increase heparin to 1600 units/hr -Check 6-hr heparin level -Monitor heparin level, CBC, S/Sx bleeding daily  Fredonia Highland, PharmD PGY-2 Cardiology Pharmacy Resident Pager: [phone] 09/13/2017

## 2017-09-13 NOTE — Plan of Care (Signed)
Problem: Bowel/Gastric: Goal: Will not experience complications related to bowel motility Outcome: Progressing Pt had large BM today  Problem: Activity: Goal: Risk for activity intolerance will decrease Outcome: Not Progressing Pt remains on bedrest, turn q2, ROM, encouraging movement of extremeties  Problem: Coping: Goal: Level of anxiety will decrease Outcome: Progressing Pt is calm and cooperative   Problem: Role Relationship: Goal: Method of communication will improve Outcome: Progressing Pt nodding and gesturing appropriately

## 2017-09-14 ENCOUNTER — Inpatient Hospital Stay (HOSPITAL_COMMUNITY): Payer: PPO

## 2017-09-14 LAB — COMPREHENSIVE METABOLIC PANEL
ALT: 29 U/L (ref 17–63)
AST: 40 U/L (ref 15–41)
Albumin: 2.2 g/dL — ABNORMAL LOW (ref 3.5–5.0)
Alkaline Phosphatase: 79 U/L (ref 38–126)
Anion gap: 10 (ref 5–15)
BUN: 63 mg/dL — ABNORMAL HIGH (ref 6–20)
CO2: 28 mmol/L (ref 22–32)
Calcium: 8.1 mg/dL — ABNORMAL LOW (ref 8.9–10.3)
Chloride: 104 mmol/L (ref 101–111)
Creatinine, Ser: 1.34 mg/dL — ABNORMAL HIGH (ref 0.61–1.24)
GFR calc Af Amer: >60 mL/min (ref >60)
GFR calc non Af Amer: 55 mL/min — ABNORMAL LOW (ref >60)
Glucose, Bld: 245 mg/dL — ABNORMAL HIGH (ref 65–99)
Potassium: 3.3 mmol/L — ABNORMAL LOW (ref 3.5–5.1)
Sodium: 142 mmol/L (ref 135–145)
Total Bilirubin: 0.5 mg/dL (ref 0.3–1.2)
Total Protein: 7.1 g/dL (ref 6.5–8.1)

## 2017-09-14 LAB — GLUCOSE, CAPILLARY
GLUCOSE-CAPILLARY: 223 mg/dL — AB (ref 65–99)
Glucose-Capillary: 215 mg/dL — ABNORMAL HIGH (ref 65–99)
Glucose-Capillary: 226 mg/dL — ABNORMAL HIGH (ref 65–99)
Glucose-Capillary: 200 mg/dL — ABNORMAL HIGH (ref 65–99)

## 2017-09-14 LAB — COOXEMETRY PANEL
Carboxyhemoglobin: 1.1 % (ref 0.5–1.5)
Methemoglobin: 1.3 % (ref 0.0–1.5)
O2 Saturation: 63.4 %
Total hemoglobin: 9.7 g/dL — ABNORMAL LOW (ref 12.0–16.0)

## 2017-09-14 LAB — POCT I-STAT, CHEM 8
BUN: 49 mg/dL — AB (ref 6–20)
CHLORIDE: 105 mmol/L (ref 101–111)
CREATININE: 1.1 mg/dL (ref 0.61–1.24)
Calcium, Ion: 1.17 mmol/L (ref 1.15–1.40)
GLUCOSE: 288 mg/dL — AB (ref 65–99)
HCT: 27 % — ABNORMAL LOW (ref 39.0–52.0)
Hemoglobin: 9.2 g/dL — ABNORMAL LOW (ref 13.0–17.0)
POTASSIUM: 3.8 mmol/L (ref 3.5–5.1)
Sodium: 144 mmol/L (ref 135–145)
TCO2: 30 mmol/L (ref 22–32)

## 2017-09-14 LAB — POCT I-STAT 3, ART BLOOD GAS (G3+)
ACID-BASE EXCESS: 6 mmol/L — AB (ref 0.0–2.0)
Bicarbonate: 29.6 mmol/L — ABNORMAL HIGH (ref 20.0–28.0)
O2 Saturation: 97 %
PCO2 ART: 40.1 mmHg (ref 32.0–48.0)
PH ART: 7.476 — AB (ref 7.350–7.450)
TCO2: 31 mmol/L (ref 22–32)
pO2, Arterial: 88 mmHg (ref 83.0–108.0)

## 2017-09-14 LAB — CBC
HCT: 29.8 % — ABNORMAL LOW (ref 39.0–52.0)
Hemoglobin: 9.5 g/dL — ABNORMAL LOW (ref 13.0–17.0)
MCH: 29.2 pg (ref 26.0–34.0)
MCHC: 31.9 g/dL (ref 30.0–36.0)
MCV: 91.7 fL (ref 78.0–100.0)
Platelets: 400 10*3/uL (ref 150–400)
RBC: 3.25 MIL/uL — ABNORMAL LOW (ref 4.22–5.81)
RDW: 15.1 % (ref 11.5–15.5)
WBC: 8.8 10*3/uL (ref 4.0–10.5)

## 2017-09-14 LAB — HEPARIN LEVEL (UNFRACTIONATED)
Heparin Unfractionated: 0.52 IU/mL (ref 0.30–0.70)
Heparin Unfractionated: 0.51 IU/mL (ref 0.30–0.70)

## 2017-09-14 MED ORDER — LEVALBUTEROL HCL 1.25 MG/0.5ML IN NEBU
1.2500 mg | INHALATION_SOLUTION | Freq: Four times a day (QID) | RESPIRATORY_TRACT | Status: DC
  Administered 2017-09-14: 1.25 mg via RESPIRATORY_TRACT
  Filled 2017-09-14: qty 0.5

## 2017-09-14 MED ORDER — POTASSIUM CHLORIDE 10 MEQ/50ML IV SOLN
10.0000 meq | INTRAVENOUS | Status: AC
  Administered 2017-09-14 (×2): 10 meq via INTRAVENOUS
  Filled 2017-09-14 (×2): qty 50

## 2017-09-14 MED ORDER — VITAL 1.5 CAL PO LIQD
1000.0000 mL | ORAL | Status: DC
  Administered 2017-09-14: 1000 mL
  Filled 2017-09-14 (×2): qty 1000

## 2017-09-14 MED ORDER — INSULIN GLARGINE 100 UNIT/ML ~~LOC~~ SOLN
50.0000 [IU] | Freq: Two times a day (BID) | SUBCUTANEOUS | Status: DC
  Administered 2017-09-14 (×2): 50 [IU] via SUBCUTANEOUS
  Filled 2017-09-14 (×3): qty 0.5

## 2017-09-14 MED ORDER — LEVALBUTEROL HCL 1.25 MG/0.5ML IN NEBU
1.2500 mg | INHALATION_SOLUTION | Freq: Four times a day (QID) | RESPIRATORY_TRACT | Status: DC
  Administered 2017-09-14 – 2017-09-24 (×42): 1.25 mg via RESPIRATORY_TRACT
  Filled 2017-09-14 (×43): qty 0.5

## 2017-09-14 MED ORDER — POTASSIUM CHLORIDE 10 MEQ/50ML IV SOLN
10.0000 meq | INTRAVENOUS | Status: AC
  Administered 2017-09-14 (×3): 10 meq via INTRAVENOUS
  Filled 2017-09-14 (×3): qty 50

## 2017-09-14 MED ORDER — MOMETASONE FURO-FORMOTEROL FUM 200-5 MCG/ACT IN AERO
2.0000 | INHALATION_SPRAY | Freq: Two times a day (BID) | RESPIRATORY_TRACT | Status: DC
  Administered 2017-09-15 – 2017-09-16 (×3): 2 via RESPIRATORY_TRACT
  Filled 2017-09-14: qty 8.8

## 2017-09-14 NOTE — Plan of Care (Signed)
Problem: Coping: Goal: Level of anxiety will decrease Outcome: Progressing Pt remains cooperative and is working to remain calm.

## 2017-09-14 NOTE — Progress Notes (Signed)
6 Days Post-Op Procedure(s) (LRB): REMOVAL OF R-VENTRICULAR ASSIST DEVICE WITH CARDIOPULMONARY BYPASS (N/A) TRANSESOPHAGEAL ECHOCARDIOGRAM (TEE) (N/A) Subjective: Looks good- currently on PS wean- very alert Scattered whezes-- xopenex and dulera Excellent diuresis- cvp 10 Co-ox 62 nsr Cont heparin until coumadin started which depends on need for trach Objective: Vital signs in last 24 hours: Temp:  [97.7 F (36.5 C)-100.6 F (38.1 C)] 97.7 F (36.5 C) (10/07 0800) Pulse Rate:  [63-81] 78 (10/07 0900) Cardiac Rhythm: Normal sinus rhythm (10/07 0800) Resp:  [19-32] 25 (10/07 0900) BP: (99-158)/(50-76) 110/60 (10/07 0900) SpO2:  [95 %-100 %] 99 % (10/07 0900) Arterial Line BP: (95-161)/(44-68) 128/55 (10/07 0900) FiO2 (%):  [40 %] 40 % (10/07 0800) Weight:  [192 lb 10.9 oz (87.4 kg)] 192 lb 10.9 oz (87.4 kg) (10/07 0400)  Hemodynamic parameters for last 24 hours: PAP: (23-24)/(13-14) 24/14 CVP:  [8 mmHg-13 mmHg] 11 mmHg  Intake/Output from previous day: 10/06 0701 - 10/07 0700 In: 4012.2 [I.V.:1852.9; NG/GT:1609.3; IV Piggyback:550] Out: 5605 [Urine:4705; Emesis/NG output:850; Chest Tube:50] Intake/Output this shift: Total I/O In: 337.6 [I.V.:147.6; NG/GT:140; IV Piggyback:50] Out: 485 [Urine:455; Chest Tube:30]       Exam    General- alert and comfortable   Lungs- clear without rales, wheezes   Cor- regular rate and rhythm, no murmur , gallop   Abdomen- soft, non-tender   Extremities - warm, non-tender, minimal edema   Neuro- oriented, appropriate, no focal weakness   Lab Results:  Recent Labs  09/13/17 0319 09/13/17 1554 09/14/17 0400  WBC 8.8  --  8.8  HGB 8.9* 9.5* 9.5*  HCT 28.3* 28.0* 29.8*  PLT 371  --  400   BMET:  Recent Labs  09/13/17 0319 09/13/17 1554 09/14/17 0400  NA 137 141 142  K 3.4* 3.9 3.3*  CL 99* 101 104  CO2 28  --  28  GLUCOSE 192* 270* 245*  BUN 69* 72* 63*  CREATININE 1.70* 1.70* 1.34*  CALCIUM 7.8*  --  8.1*    PT/INR:  No results for input(s): LABPROT, INR in the last 72 hours. ABG    Component Value Date/Time   PHART 7.476 (H) 09/14/2017 0410   HCO3 29.6 (H) 09/14/2017 0410   TCO2 31 09/14/2017 0410   ACIDBASEDEF 0.5 09/10/2017 0325   O2SAT 63.4 09/14/2017 0417   CBG (last 3)   Recent Labs  09/13/17 2344 09/14/17 0349 09/14/17 0807  GLUCAP 185* 223* 215*    Assessment/Plan: S/P Procedure(s) (LRB): REMOVAL OF R-VENTRICULAR ASSIST DEVICE WITH CARDIOPULMONARY BYPASS (N/A) TRANSESOPHAGEAL ECHOCARDIOGRAM (TEE) (N/A) Diuresis Diabetes control vent wean- if not successful then trach tomorrow   LOS: 22 days    Clinton Harrington 09/14/2017

## 2017-09-14 NOTE — Progress Notes (Addendum)
ANTICOAGULATION CONSULT NOTE - Follow-Up Consult  Pharmacy Consult for heparin Indication: atrial fibrillation  Allergies  Allergen Reactions  . Aspirin Other (See Comments)    Stomach upset  . Latex Rash  . Oxycodone-Acetaminophen Itching    Patient Measurements: Height:  (180.3 cm) Weight: 192 lb 10.9 oz (87.4 kg) IBW/kg (Calculated) : 75.3 Heparin Dosing Weight: 94.1kg   Vital Signs: Temp: 98.6 F (37 C) (10/07 0350) Temp Source: Oral (10/07 0350) BP: 136/64 (10/07 0700) Pulse Rate: 81 (10/07 0700)  Labs:  Recent Labs  09/12/17 0508  09/13/17 0319 09/13/17 1210 09/13/17 1554 09/13/17 1950 09/14/17 0400  HGB 9.5*  < > 8.9*  --  9.5*  --  9.5*  HCT 29.6*  < > 28.3*  --  28.0*  --  29.8*  PLT 463*  --  371  --   --   --  400  HEPARINUNFRC 0.20*  < > 0.13* 0.26*  --  0.45 0.52  CREATININE 1.69*  < > 1.70*  --  1.70*  --  1.34*  < > = values in this interval not displayed.  Estimated Creatinine Clearance: 60.1 mL/min (A) (by C-G formula based on SCr of 1.34 mg/dL (H)).   Assessment: 35 yoM s/p IABP removal and Centrimag now with recurrent AFib s/p DCCV to start on heparin. Heparin slightly supratherapeutic this morning at 0.52, CBC stable.  Goal of Therapy:  Heparin Level 0.35-0.5 units/ml Monitor platelets by anticoagulation protocol: Yes   Plan:  -Reduce heparin to 1550 units/hr -Check 6-hr heparin level -Monitor heparin level, CBC, S/Sx bleeding daily  ADDENDUM: Repeat heparin level remains therapeutic but slightly above goal at 0.51.  Plan: -Reduce heparin to 1500 units/hr -Recheck heparin level and CBC in am  Fredonia Highland, PharmD PGY-2 Cardiology Pharmacy Resident Pager: [phone] 09/14/2017

## 2017-09-14 NOTE — Plan of Care (Signed)
Problem: Respiratory: Goal: Ability to maintain a clear airway and adequate ventilation will improve Outcome: Progressing Pt weaned well today.

## 2017-09-14 NOTE — Progress Notes (Signed)
PULMONARY / CRITICAL CARE MEDICINE   Name: Clinton Harrington MRN: [id number] DOB: Aug 25, 1951    ADMISSION DATE:  08/23/2017 CONSULTATION DATE:  08/30/2017  REFERRING MD:  Roxy Manns  CHIEF COMPLAINT:  Acute respiratory failure with hypoxemia  BRIEF:  66 y/o male with CAD who had an MI on 9/15, had severe RV failure, was treated with CABG, IABP, eventual centrimag device placed.  Post operatively was weaned off of the RVAD device on 10/1, remains mechanically ventilated.  Currently being treated for LLL HCAP.  PAST MEDICAL HISTORY :  He  has a past medical history of Arthritis; Diabetes ([place]); Diabetes mellitus (Jan. 1999); High cholesterol; Hypertension; Myocardial infarct ([place]); Peripheral vascular disease ([place]); Seasonal allergies; Smoker; and Urine incontinence.   REVIEW OF SYSTEMS:   Cannot obtain due to intubation  SUBJECTIVE:  Low grade fever cxr looks a little worse on left Renal function improved, still diuresing well  VITAL SIGNS: BP 136/64   Pulse 81   Temp 98.6 F (37 C) (Oral)   Resp (!) 21   Ht _0  (1.803 m)   Wt 192 lb 10.9 oz (87.4 kg)   SpO2 100%   BMI 26.87 kg/m   HEMODYNAMICS: PAP: (23-24)/(12-14) 24/14 CVP:  [8 mmHg-13 mmHg] 11 mmHg CO:  [5.5 L/min] 5.5 L/min CI:  [2.6 L/min/m2] 2.6 L/min/m2  VENTILATOR SETTINGS: Vent Mode: PSV;CPAP FiO2 (%):  [40 %-50 %] 40 % Set Rate:  [20 bmp] 20 bmp Vt Set:  [560 mL] 560 mL PEEP:  [5 cmH20] 5 cmH20 Pressure Support:  [5 cmH20-10 cmH20] 5 cmH20 Plateau Pressure:  [17 cmH20-20 cmH20] 20 cmH20  INTAKE / OUTPUT: I/O last 3 completed shifts: In: 5795.8 [I.V.:2816.5; NG/GT:2229.3; IV Piggyback:750] Out: 8205 [ZYYQM:2500; Emesis/NG output:2100; Chest Tube:80]  PHYSICAL EXAMINATION:  General:  In bed on vent HENT: NCAT ETT in place PULM: Rhonchi on left, vent supported breathing CV: RRR, no mgr GI: BS+, soft, nontender MSK: normal bulk and tone Neuro: awake on vent, follow  commands    LABS:  BMET  Recent Labs Lab 09/12/17 0508  09/13/17 0319 09/13/17 1554 09/14/17 0400  NA 136  < > 137 141 142  K 3.2*  < > 3.4* 3.9 3.3*  CL 96*  < > 99* 101 104  CO2 27  --  28  --  28  BUN 67*  < > 69* 72* 63*  CREATININE 1.69*  < > 1.70* 1.70* 1.34*  GLUCOSE 222*  < > 192* 270* 245*  < > = values in this interval not displayed.  Electrolytes  Recent Labs Lab 09/09/17 1514 09/10/17 0232  09/12/17 0508 09/13/17 0319 09/14/17 0400  CALCIUM  --  7.2*  < > 8.1* 7.8* 8.1*  MG 3.0* 2.6*  --  2.2  --   --   PHOS  --  4.3  --  3.3  --   --   < > = values in this interval not displayed.  CBC  Recent Labs Lab 09/12/17 0508  09/13/17 0319 09/13/17 1554 09/14/17 0400  WBC 12.6*  --  8.8  --  8.8  HGB 9.5*  < > 8.9* 9.5* 9.5*  HCT 29.6*  < > 28.3* 28.0* 29.8*  PLT 463*  --  371  --  400  < > = values in this interval not displayed.  Coag's  Recent Labs Lab 09/08/17 0500 09/08/17 1145  APTT 69* 39*  INR 1.61 1.58    Sepsis Markers No results for input(s): LATICACIDVEN, PROCALCITON,  O2SATVEN in the last 168 hours.  ABG  Recent Labs Lab 09/12/17 0327 09/13/17 0355 09/14/17 0410  PHART 7.454* 7.464* 7.476*  PCO2ART 41.6 39.4 40.1  PO2ART 85.6 120* 88.0    Liver Enzymes  Recent Labs Lab 09/12/17 0508 09/13/17 0319 09/14/17 0400  AST 66* 52* 40  ALT _0 ALKPHOS 88 76 79  BILITOT 0.7 0.7 0.5  ALBUMIN 2.4* 2.1* 2.2*    Cardiac Enzymes No results for input(s): TROPONINI, PROBNP in the last 168 hours.  Glucose  Recent Labs Lab 09/13/17 0406 09/13/17 0746 09/13/17 1150 09/13/17 1920 09/13/17 2344 09/14/17 0349  GLUCAP 193* 207* 233* 134* 185* 223*    Imaging Dg Chest Port 1 View  Result Date: 09/14/2017 CLINICAL DATA:  Followup CABG EXAM: PORTABLE CHEST 1 VIEW COMPARISON:  09/13/2017 FINDINGS: Endotracheal tube tip 3 cm above the carina. Nasogastric tube enters the abdomen. Right central line tip in the SVC  above the right atrium. Swan-Ganz catheter is been removed. Left subclavian venous access sheath remains in place. Bilateral chest tubes. No pneumothorax. Persistent mild basilar atelectasis, left more than right. Mild edema. IMPRESSION: Swan-Ganz catheter removed. Other lines and tubes well positioned. No pneumothorax. Persistent basilar atelectasis left more than right and mild edema. Electronically Signed   By: Nelson Chimes M.D.   On: 09/14/2017 06:56   10/7 CXR images indepedently reviewed, worsening consolidation on left vs difference in radiographic technicque (less penetrated)  CULTURES: 9/22 sputum>>>ng 9/24>> Sputum>>ng 9/24 Blood>>NEG  9/24 Urine>>ng 9/28 BC x 2 >coag negative staph 1/4 10/2>> respiratory flora, candida albicans  ANTIBIOTICS: zinacef >>>9/22- 9/24 ceftaz 9/22>>>9/28 Vanc 9/20>>  Zosyn 9/28 >>  SIGNIFICANT EVENTS: 08/23/2017 - admit 9/22 - intubated Cath cabg RV assist device 9/23 - On epi/levo/RVAD/vent.  CI 1.8 despite all interventions 9/24>> Epi, Levo, milrinine,amio,insulin gtt, Lasix gtt, Heparin, Fent, Versed 9/26 - Remains sedated on fent/ versed drips RVAD flows down to 3200 Good UO on lasix gtt @ 15 9/27 - 40% fio2, peep 8. RPM on RVAD turned down. Stil on levophed, milrinone, epi ggtt and heparin.admio gtt. Making urine. Has chest draines. Good urine outoput. Heparin on hold briefly before resumptions due to bleed in groin site. Getting 2 UI PRBC 9/30 mucous plugging requiring multiple lavages 10/1 RV assist device  removed  in the OR 2/2 bleeding 1-0/2 - Stable off RVAD, remains on levo, vaso,Epi, milrinone  STUDIES: 10/6 CT head > NAICP, may need MRI to assess further  Lines/tubes: 10/1 R subclavian CVL >   DISCUSSION: 66 y/o male s/p MI, RV failure, CABG, centrimag RV support deviced (now weaned off) who remains intubated for respiratory failure.  Currently being treated for HCAP.   ASSESSMENT / PLAN:  PULMONARY A: Acute  respiratory failure with hypoxemia > improving HCAP > infiltrate worse on left P:   Wean as able Full mechanical vent support VAP prevention Daily WUA/SBT Consider extubation on 10/8 if does well with weaning today If he fails weaning today sooner than yesterday (less than 6 hours) will perform bronch/bal; Otherwise, will send trach aspirate   CARDIOVASCULAR A:  Cardiogenic shock > improving CAD, s/p MI and CABG RV failure P:  Vasopressors/arrhythmia meds per cardiology TCTS Tele  RENAL A:   AKI>  improving P:   Monitor BMET and UOP Replace electrolytes as needed  GASTROINTESTINAL A:   No acute issues P:   Continue tube feeding  HEMATOLOGIC A:   No acute issues P:  Monitor for bleeding  INFECTIOUS  A:   HCAP, fever on 10/6 Coag neg staph in blood> most likely contaminant P:   Send trach aspirate Continue Vanc and zosyn today diflucan   ENDOCRINE A:   DM2, persistent hyperglycemia   P:   Continue SSI Glargine 50 BID > monitor on this dose today   NEUROLOGIC A:   10/5 New left sided weakness> resolved as of my exam 10/6 P:   Continue precedex, RASS goal -1 to -2  FAMILY  - Updates:  None bedside  My cc time 30 minutes  Roselie Awkward, MD [facility] Pager: [phone] Cell: [phone] After 3pm or if no response, call [phone]    09/14/2017, 7:52 AM

## 2017-09-14 NOTE — Plan of Care (Signed)
Problem: Activity: Goal: Ability to tolerate increased activity will improve Outcome: Progressing Pt is still on bedrest but pt tolerates turns well and active ROM.

## 2017-09-14 NOTE — Progress Notes (Signed)
CT surgery p.m. Rounds  Patient had excellent day Tolerated pressure support weaning from 8 AM to 4 PM Lungs now clear Sinus rhythm Potential extubation then if not possible then  bedside tracheostomy tomorrow

## 2017-09-14 NOTE — Progress Notes (Signed)
Patient ID: Clinton Harrington, male   DOB: [birth year], 67 y.o.   MRN: [id number]        Advanced Heart Failure Rounding Note   Subjective:    Events CABG x4 IABP Centrimag 9/20    IABP removed 9/24.  Centrimag RVAD in place DC-CV successful 9/29  Centrimag removal 10/1  Epi off. On milrinone 0.125. Weaning from vent. Wide awake and interactive today. Moving all 4s. SBP 140. CVP 10. Co-ox 63%  Blood CX NGTD from 09/10/2017   Echo (09/01/17): EF 65-70% with moderate LV hypertrophy, mildly dilated RV with moderately decreased systolic function, IVC dilated.   Objective:   Weight Range: 87.4 kg (192 lb 10.9 oz) Body mass index is 26.87 kg/m.   Vital Signs:   Temp:  [97.7 F (36.5 C)-99.7 F (37.6 C)] 97.7 F (36.5 C) (10/07 1100) Pulse Rate:  [59-81] 81 (10/07 1200) Resp:  [19-32] 23 (10/07 1200) BP: (102-158)/(50-76) 121/68 (10/07 1200) SpO2:  [95 %-100 %] 97 % (10/07 1206) Arterial Line BP: (95-161)/(44-68) 120/56 (10/07 1200) FiO2 (%):  [40 %] 40 % (10/07 1206) Weight:  [87.4 kg (192 lb 10.9 oz)] 87.4 kg (192 lb 10.9 oz) (10/07 0400) Last BM Date: 09/13/17  Weight change: Filed Weights   09/12/17 0500 09/13/17 0500 09/14/17 0400  Weight: 91.1 kg (200 lb 13.4 oz) 89.3 kg (196 lb 13.9 oz) 87.4 kg (192 lb 10.9 oz)    Intake/Output:   Intake/Output Summary (Last 24 hours) at 09/14/17 1239 Last data filed at 09/14/17 1200  Gross per 24 hour  Intake          3644.68 ml  Output             5380 ml  Net         -1735.32 ml      Physical Exam   General:  Wide awake on vent. Interactive. Moves all 4s HEENT: normal x for ETT and poor dentition  Neck: supple. JVP 10.. Carotids 2+ bilat; no bruits. No lymphadenopathy or thryomegaly appreciated. Cor: Sternal dressing intact. + CTs RRR no s3 Lungs: mechanical BS. Scattered upper airway wheeze Abdomen: soft, nontender, nondistended. No hepatosplenomegaly. No bruits or masses. Good bowel sounds. Extremities: no  cyanosis, clubbing, rash, edema  Neuro: alert follows commands. moves all 4 extremities w/o difficulty. Affect pleasant    Telemetry   NSR 80s personally reviewed.   Labs    CBC  Recent Labs  09/13/17 0319 09/13/17 1554 09/14/17 0400  WBC 8.8  --  8.8  HGB 8.9* 9.5* 9.5*  HCT 28.3* 28.0* 29.8*  MCV 90.4  --  91.7  PLT 371  --  945   Basic Metabolic Panel  Recent Labs  09/12/17 0508  09/13/17 0319 09/13/17 1554 09/14/17 0400  NA 136  < > 137 141 142  K 3.2*  < > 3.4* 3.9 3.3*  CL 96*  < > 99* 101 104  CO2 27  --  28  --  28  GLUCOSE 222*  < > 192* 270* 245*  BUN 67*  < > 69* 72* 63*  CREATININE 1.69*  < > 1.70* 1.70* 1.34*  CALCIUM 8.1*  --  7.8*  --  8.1*  MG 2.2  --   --   --   --   PHOS 3.3  --   --   --   --   < > = values in this interval not displayed. Liver Function Tests  Recent Labs  09/13/17  0319 09/14/17 0400  AST 52* 40  ALT 29 29  ALKPHOS 76 79  BILITOT 0.7 0.5  PROT 6.6 7.1  ALBUMIN 2.1* 2.2*   No results for input(s): LIPASE, AMYLASE in the last 72 hours. Cardiac Enzymes No results for input(s): CKTOTAL, CKMB, CKMBINDEX, TROPONINI in the last 72 hours.  BNP: BNP (last 3 results)  Recent Labs  08/23/17 1927  BNP 200.0*    ProBNP (last 3 results) No results for input(s): PROBNP in the last 8760 hours.   D-Dimer No results for input(s): DDIMER in the last 72 hours. Hemoglobin A1C No results for input(s): HGBA1C in the last 72 hours. Fasting Lipid Panel No results for input(s): CHOL, HDL, LDLCALC, TRIG, CHOLHDL, LDLDIRECT in the last 72 hours. Thyroid Function Tests No results for input(s): TSH, T4TOTAL, T3FREE, THYROIDAB in the last 72 hours.  Invalid input(s): FREET3  Other results:   Imaging    Dg Chest Port 1 View  Result Date: 09/14/2017 CLINICAL DATA:  Followup CABG EXAM: PORTABLE CHEST 1 VIEW COMPARISON:  09/13/2017 FINDINGS: Endotracheal tube tip 3 cm above the carina. Nasogastric tube enters the abdomen.  Right central line tip in the SVC above the right atrium. Swan-Ganz catheter is been removed. Left subclavian venous access sheath remains in place. Bilateral chest tubes. No pneumothorax. Persistent mild basilar atelectasis, left more than right. Mild edema. IMPRESSION: Swan-Ganz catheter removed. Other lines and tubes well positioned. No pneumothorax. Persistent basilar atelectasis left more than right and mild edema. Electronically Signed   By: Nelson Chimes M.D.   On: 09/14/2017 06:56     Medications:     Scheduled Medications: . aspirin EC  325 mg Oral Daily   Or  . aspirin  324 mg Per Tube Daily  . atorvastatin  80 mg Per Tube q1800  . bisacodyl  10 mg Oral Daily   Or  . bisacodyl  10 mg Rectal Daily  . brimonidine  1 drop Right Eye BID  . chlorhexidine gluconate (MEDLINE KIT)  15 mL Mouth Rinse BID  . Chlorhexidine Gluconate Cloth  6 each Topical Daily  . docusate  200 mg Oral Daily  . furosemide  20 mg Intravenous Q8H  . hydrocortisone sod succinate (SOLU-CORTEF) inj  50 mg Intravenous Daily  . insulin aspart  0-24 Units Subcutaneous Q4H  . insulin glargine  50 Units Subcutaneous BID  . levalbuterol  1.25 mg Nebulization Q6H  . mouth rinse  15 mL Mouth Rinse 10 times per day  . metoCLOPramide (REGLAN) injection  10 mg Intravenous Q6H  . mometasone-formoterol  2 puff Inhalation BID  . mupirocin cream   Topical BID  . pantoprazole (PROTONIX) IV  40 mg Intravenous Q24H  . sodium chloride flush  10-40 mL Intracatheter Q12H  . sodium chloride flush  3 mL Intravenous Q12H  . spironolactone  25 mg Per Tube Daily    Infusions: . sodium chloride    . sodium chloride 20 mL/hr at 09/14/17 1200  . amiodarone 30 mg/hr (09/14/17 1200)  . dexmedetomidine (PRECEDEX) IV infusion 0.5 mcg/kg/hr (09/14/17 1200)  . epinephrine Stopped (09/13/17 1147)  . feeding supplement (VITAL 1.5 CAL) 1,000 mL (09/14/17 1200)  . fentaNYL infusion INTRAVENOUS 25 mcg/hr (09/14/17 1200)  . heparin 1,550  Units/hr (09/14/17 1200)  . lactated ringers Stopped (09/13/17 1702)  . milrinone 0.125 mcg/kg/min (09/14/17 1200)  . piperacillin-tazobactam (ZOSYN)  IV Stopped (09/14/17 0945)    PRN Medications: acetaminophen (TYLENOL) oral liquid 160 mg/5 mL, fentaNYL, metoprolol tartrate,  midazolam, ondansetron (ZOFRAN) IV, oxyCODONE, sodium chloride flush, sodium chloride flush    Patient Profile   Mr Pelc is a 66 year old with a history of DMII, smoker, CAD S/P PCI RCA 20 years ago, HTN, PAD, and hyperlipidemia admitted with CP and inferior MI with RV infarction.  He underwent LHC with IABP. After optimized, underwent CABG x4 centrimag for RV infarct on 9/20 and continued IABP.   Assessment/Plan   1. Post-cardiotomy cardiogenic shock: Primarily RV failure from inferior infarct with RV involvement.  Repeat echo on 9/24 showed vigorous LV systolic function with moderate RV dysfunction.  Now s/p CABG and Centrimag for RV support on 9/20. IABP removed 9/24 with good LV function.  Centrimag out 10/1. Currently on epi 1 mcg and milrinone 0.2 mcg.  CO-OX 63%. Good I/Os on low-dose bolus lasix, weight down to 192 (below pre-op weight) - Continue epi. Continue milrinone until after he is extubated for RV support - Volume status looks good. Now on bolus lasix 20 q8.  Can adjust as needed. Goal CVP 8-12. CVP 10 today - Continue spiro to 25 mg daily. K 3.3 Supplement K.   2. CAD: Inferior MI with RV involvement at presentation, now s/p CABG.   - He is getting ASA.   - Continue statin.  3. Atrial fibrillation/flutter: DCCV 9/29, now remains in NSR.  - NSR with PACs today. Continue amio 30 mg per hour. Can switch to po once extubated - On heparin drip (had DCCV this admission from atrial fibrillation).   Eventually transition to Eliquis once extubated.  - D/w PharmD 4. AKI:  -Creatinine down to 1.3 today  5. Acute respiratory failure:  -Suspect left-sided HCAP, on broad spectrum antibiotics.  -Much  improved. Hopefully can be extubated tomorrow. If fails, will need trach  6. Neuro:  -? L-sided weakness now resolved.   -Head CT 10/5 negative.  7. ID: Coag negative Staph on 9/28 blood culture, possible HCAP versus contaminant.  Afebrile. WBC 12.6.  - Blood CX 10/3- NGTD.   - Covering for HCAP with vancomycin/Zosyn => LLL infiltrate on CXR.  8. Anemia:  -Received 2 uPRBCs 09/04/17. 4 units PRBCs on 9/29. Received 2U PRBCs 10/1.  Hgb 9.5 today. 10. Sacral decubiti  - WOC following 11. Diabetes: Difficult to control, appreciate help from diabetes coordinator. No change.   CRITICAL CARE Performed by: Glori Bickers  Total critical care time: 35 minutes  Critical care time was exclusive of separately billable procedures and treating other patients.  Critical care was necessary to treat or prevent imminent or life-threatening deterioration.  Critical care was time spent personally by me (independent of midlevel providers or residents) on the following activities: development of treatment plan with patient and/or surrogate as well as nursing, discussions with consultants, evaluation of patient's response to treatment, examination of patient, obtaining history from patient or surrogate, ordering and performing treatments and interventions, ordering and review of laboratory studies, ordering and review of radiographic studies, pulse oximetry and re-evaluation of patient's condition.    Glori Bickers MD 09/14/2017 12:38 PM

## 2017-09-15 ENCOUNTER — Inpatient Hospital Stay (HOSPITAL_COMMUNITY): Payer: PPO

## 2017-09-15 DIAGNOSIS — G9340 Encephalopathy, unspecified: Secondary | ICD-10-CM

## 2017-09-15 LAB — CULTURE, BLOOD (ROUTINE X 2)
CULTURE: NO GROWTH
Culture: NO GROWTH
Special Requests: ADEQUATE

## 2017-09-15 LAB — POCT I-STAT 3, ART BLOOD GAS (G3+)
ACID-BASE EXCESS: 8 mmol/L — AB (ref 0.0–2.0)
Acid-Base Excess: 7 mmol/L — ABNORMAL HIGH (ref 0.0–2.0)
Bicarbonate: 31.7 mmol/L — ABNORMAL HIGH (ref 20.0–28.0)
Bicarbonate: 31.8 mmol/L — ABNORMAL HIGH (ref 20.0–28.0)
O2 SAT: 96 %
O2 Saturation: 99 %
PCO2 ART: 42.8 mmHg (ref 32.0–48.0)
PCO2 ART: 41.7 mmHg (ref 32.0–48.0)
PH ART: 7.476 — AB (ref 7.350–7.450)
PH ART: 7.489 — AB (ref 7.350–7.450)
PO2 ART: 120 mmHg — AB (ref 83.0–108.0)
PO2 ART: 73 mmHg — AB (ref 83.0–108.0)
Patient temperature: 97.5
Patient temperature: 97.6
TCO2: 33 mmol/L — ABNORMAL HIGH (ref 22–32)
TCO2: 33 mmol/L — ABNORMAL HIGH (ref 22–32)

## 2017-09-15 LAB — BLOOD GAS, ARTERIAL
Acid-Base Excess: 4.3 mmol/L — ABNORMAL HIGH (ref 0.0–2.0)
Bicarbonate: 27.9 mmol/L (ref 20.0–28.0)
Drawn by: 51133
FIO2: 0.5
MECHVT: 560 mL
O2 Saturation: 98.7 %
PEEP: 5 cmH2O
Patient temperature: 98.6
RATE: 20 resp/min
pCO2 arterial: 39.4 mmHg (ref 32.0–48.0)
pH, Arterial: 7.464 — ABNORMAL HIGH (ref 7.350–7.450)
pO2, Arterial: 120 mmHg — ABNORMAL HIGH (ref 83.0–108.0)

## 2017-09-15 LAB — GLUCOSE, CAPILLARY
GLUCOSE-CAPILLARY: 101 mg/dL — AB (ref 65–99)
GLUCOSE-CAPILLARY: 219 mg/dL — AB (ref 65–99)
GLUCOSE-CAPILLARY: 58 mg/dL — AB (ref 65–99)
GLUCOSE-CAPILLARY: 85 mg/dL (ref 65–99)
GLUCOSE-CAPILLARY: 90 mg/dL (ref 65–99)
Glucose-Capillary: 236 mg/dL — ABNORMAL HIGH (ref 65–99)
Glucose-Capillary: 85 mg/dL (ref 65–99)

## 2017-09-15 LAB — COMPREHENSIVE METABOLIC PANEL
ALT: 27 U/L (ref 17–63)
AST: 34 U/L (ref 15–41)
Albumin: 2.1 g/dL — ABNORMAL LOW (ref 3.5–5.0)
Alkaline Phosphatase: 83 U/L (ref 38–126)
Anion gap: 9 (ref 5–15)
BUN: 44 mg/dL — ABNORMAL HIGH (ref 6–20)
CO2: 29 mmol/L (ref 22–32)
Calcium: 8.3 mg/dL — ABNORMAL LOW (ref 8.9–10.3)
Chloride: 105 mmol/L (ref 101–111)
Creatinine, Ser: 1.06 mg/dL (ref 0.61–1.24)
GFR calc Af Amer: >60 mL/min (ref >60)
GFR calc non Af Amer: >60 mL/min (ref >60)
Glucose, Bld: 261 mg/dL — ABNORMAL HIGH (ref 65–99)
Potassium: 3.7 mmol/L (ref 3.5–5.1)
Sodium: 143 mmol/L (ref 135–145)
Total Bilirubin: 0.4 mg/dL (ref 0.3–1.2)
Total Protein: 7 g/dL (ref 6.5–8.1)

## 2017-09-15 LAB — CBC
HCT: 29.6 % — ABNORMAL LOW (ref 39.0–52.0)
Hemoglobin: 9.2 g/dL — ABNORMAL LOW (ref 13.0–17.0)
MCH: 28.6 pg (ref 26.0–34.0)
MCHC: 31.1 g/dL (ref 30.0–36.0)
MCV: 91.9 fL (ref 78.0–100.0)
Platelets: 378 10*3/uL (ref 150–400)
RBC: 3.22 MIL/uL — ABNORMAL LOW (ref 4.22–5.81)
RDW: 15.1 % (ref 11.5–15.5)
WBC: 7.3 10*3/uL (ref 4.0–10.5)

## 2017-09-15 LAB — POCT I-STAT, CHEM 8
BUN: 40 mg/dL — AB (ref 6–20)
CHLORIDE: 107 mmol/L (ref 101–111)
Calcium, Ion: 1.17 mmol/L (ref 1.15–1.40)
Creatinine, Ser: 0.9 mg/dL (ref 0.61–1.24)
Glucose, Bld: 209 mg/dL — ABNORMAL HIGH (ref 65–99)
HCT: 29 % — ABNORMAL LOW (ref 39.0–52.0)
Hemoglobin: 9.9 g/dL — ABNORMAL LOW (ref 13.0–17.0)
POTASSIUM: 3.6 mmol/L (ref 3.5–5.1)
SODIUM: 148 mmol/L — AB (ref 135–145)
TCO2: 32 mmol/L (ref 22–32)

## 2017-09-15 LAB — COOXEMETRY PANEL
Carboxyhemoglobin: 1 % (ref 0.5–1.5)
Methemoglobin: 1.3 % (ref 0.0–1.5)
O2 Saturation: 70.2 %
Total hemoglobin: 9.5 g/dL — ABNORMAL LOW (ref 12.0–16.0)

## 2017-09-15 LAB — HEPARIN LEVEL (UNFRACTIONATED): Heparin Unfractionated: 0.43 IU/mL (ref 0.30–0.70)

## 2017-09-15 LAB — MAGNESIUM: MAGNESIUM: 2.2 mg/dL (ref 1.7–2.4)

## 2017-09-15 MED ORDER — FUROSEMIDE 10 MG/ML IJ SOLN
20.0000 mg | Freq: Two times a day (BID) | INTRAMUSCULAR | Status: DC
  Administered 2017-09-15 – 2017-09-16 (×2): 20 mg via INTRAVENOUS
  Filled 2017-09-15 (×2): qty 2

## 2017-09-15 MED ORDER — POTASSIUM CHLORIDE 10 MEQ/50ML IV SOLN
10.0000 meq | INTRAVENOUS | Status: AC
  Administered 2017-09-15 (×4): 10 meq via INTRAVENOUS
  Filled 2017-09-15 (×2): qty 50

## 2017-09-15 MED ORDER — HYDRALAZINE HCL 20 MG/ML IJ SOLN
10.0000 mg | Freq: Three times a day (TID) | INTRAMUSCULAR | Status: DC
  Administered 2017-09-15 – 2017-09-16 (×3): 10 mg via INTRAVENOUS
  Filled 2017-09-15 (×2): qty 1

## 2017-09-15 MED ORDER — INSULIN GLARGINE 100 UNIT/ML ~~LOC~~ SOLN
30.0000 [IU] | Freq: Two times a day (BID) | SUBCUTANEOUS | Status: DC
  Filled 2017-09-15 (×2): qty 0.3

## 2017-09-15 MED ORDER — WARFARIN - PHYSICIAN DOSING INPATIENT
Freq: Every day | Status: DC
  Administered 2017-09-16 – 2017-09-19 (×4)

## 2017-09-15 MED ORDER — INSULIN GLARGINE 100 UNIT/ML ~~LOC~~ SOLN
20.0000 [IU] | Freq: Two times a day (BID) | SUBCUTANEOUS | Status: DC
  Filled 2017-09-15 (×2): qty 0.2

## 2017-09-15 MED ORDER — WARFARIN SODIUM 2.5 MG PO TABS: 2.5000 mg | ORAL_TABLET | Freq: Every day | ORAL | Status: DC

## 2017-09-15 MED ORDER — POTASSIUM CHLORIDE 10 MEQ/50ML IV SOLN
10.0000 meq | INTRAVENOUS | Status: AC
  Administered 2017-09-15 (×3): 10 meq via INTRAVENOUS
  Filled 2017-09-15 (×3): qty 50

## 2017-09-15 MED ORDER — POTASSIUM CHLORIDE 10 MEQ/50ML IV SOLN
10.0000 meq | INTRAVENOUS | Status: DC | PRN
  Administered 2017-09-15 – 2017-09-22 (×2): 10 meq via INTRAVENOUS
  Filled 2017-09-15 (×4): qty 50

## 2017-09-15 MED ORDER — DEXTROSE-NACL 5-0.45 % IV SOLN
INTRAVENOUS | Status: DC
  Administered 2017-09-15 – 2017-09-17 (×2): via INTRAVENOUS

## 2017-09-15 MED ORDER — HYDRALAZINE HCL 20 MG/ML IJ SOLN
INTRAMUSCULAR | Status: AC
  Administered 2017-09-15: 08:00:00
  Filled 2017-09-15: qty 1

## 2017-09-15 MED ORDER — DEXTROSE 50 % IV SOLN
INTRAVENOUS | Status: AC
  Administered 2017-09-15: 50 mL
  Filled 2017-09-15: qty 50

## 2017-09-15 MED ORDER — HYDRALAZINE HCL 25 MG PO TABS: 25.0000 mg | ORAL_TABLET | Freq: Three times a day (TID) | ORAL | Status: DC

## 2017-09-15 MED ORDER — HYDRALAZINE HCL 25 MG PO TABS
25.0000 mg | ORAL_TABLET | Freq: Three times a day (TID) | ORAL | Status: DC
  Filled 2017-09-15: qty 1

## 2017-09-15 NOTE — Progress Notes (Signed)
Patient self extubated to 4 l [place] at this time. Patient able to verbalize name and is aware of surroundings and proper place. Will continue to monitor until end of shift for any issues that may require reintubation.

## 2017-09-15 NOTE — Progress Notes (Signed)
ANTICOAGULATION CONSULT NOTE - Follow-Up Consult  Pharmacy Consult for heparin Indication: atrial fibrillation  Allergies  Allergen Reactions  . Aspirin Other (See Comments)    Stomach upset  . Latex Rash  . Oxycodone-Acetaminophen Itching    Patient Measurements: Height:  (180.3 cm) Weight: 197 lb 5 oz (89.5 kg) IBW/kg (Calculated) : 75.3 Heparin Dosing Weight: 94.1kg   Vital Signs: Temp: 98 F (36.7 C) (10/08 0400) Temp Source: Oral (10/08 0400) BP: 144/71 (10/08 0700) Pulse Rate: 88 (10/08 0700)  Labs:  Recent Labs  09/13/17 0319  09/14/17 0400 09/14/17 1335 09/14/17 1556 09/15/17 0330  HGB 8.9*  < > 9.5*  --  9.2* 9.2*  HCT 28.3*  < > 29.8*  --  27.0* 29.6*  PLT 371  --  400  --   --  378  HEPARINUNFRC 0.13*  < > 0.52 0.51  --  0.43  CREATININE 1.70*  < > 1.34*  --  1.10 1.06  < > = values in this interval not displayed.  Estimated Creatinine Clearance: 76 mL/min (by C-G formula based on SCr of 1.06 mg/dL).  . sodium chloride    . sodium chloride 20 mL/hr at 09/15/17 0600  . amiodarone 30 mg/hr (09/15/17 0600)  . epinephrine Stopped (09/13/17 1147)  . heparin 1,500 Units/hr (09/15/17 0600)  . lactated ringers Stopped (09/13/17 1702)  . milrinone 0.125 mcg/kg/min (09/15/17 0600)  . piperacillin-tazobactam (ZOSYN)  IV 3.375 g (09/15/17 0545)  . potassium chloride Stopped (09/15/17 0729)     Assessment: 68 yoM s/p IABP removal and Centrimag now with recurrent AFib s/p DCCV to start on heparin. Heparin level at goal this AM.  CBC stable, no overt bleeding or complications noted.  Planning to transition to Eliquis once able to take PO.  Goal of Therapy:  Heparin Level 0.35-0.5 units/ml Monitor platelets by anticoagulation protocol: Yes   Plan:  -Continue IV heparin at 1550 units/hr -Monitor heparin level, CBC, S/Sx bleeding daily  Tad Moore, BCPS  Clinical Pharmacist Pager [phone]  09/15/2017 7:11 AM

## 2017-09-15 NOTE — Progress Notes (Signed)
eLink Physician-Brief Progress Note Patient Name: Clinton Harrington DOB: June 19, 1951 MRN: [id number]   Date of Service  09/15/2017  HPI/Events of Note  Self extubated this AM - Looks good on 4 L/min [place] O2. Sat = 95% and RR = 25.   eICU Interventions  Will order: 1. Incentive spirometry Q1 hour while awake.  2. D/C Fentanyl and Precedex IV infusions.     Intervention Category Major Interventions: Other:  [name] 09/15/2017, 4:11 AM

## 2017-09-15 NOTE — Progress Notes (Addendum)
Patient ID: Clinton Harrington, male   DOB: 30-Mar-1951, 66 y.o.   MRN: [id number]        Advanced Heart Failure Rounding Note   Subjective:    Events CABG x4 IABP Centrimag 9/20    IABP removed 9/24.  Centrimag RVAD in place DC-CV successful 9/29  Centrimag removal 10/1 Self-extubated 10/8  Epinephrine off. On milrinone 0.125. He self-extubated last night and is awake/following commands this morning.  Denies dyspnea.  Co-ox 70% with CVP 8.   Blood CX NGTD from 09/10/2017   Echo (09/01/17): EF 65-70% with moderate LV hypertrophy, mildly dilated RV with moderately decreased systolic function, IVC dilated.   Objective:   Weight Range: 197 lb 5 oz (89.5 kg) Body mass index is 27.52 kg/m.   Vital Signs:   Temp:  [97.7 F (36.5 C)-98.5 F (36.9 C)] 98 F (36.7 C) (10/08 0400) Pulse Rate:  [59-92] 88 (10/08 0700) Resp:  [19-29] 24 (10/08 0700) BP: (109-167)/(60-78) 144/71 (10/08 0700) SpO2:  [95 %-100 %] 95 % (10/08 0700) Arterial Line BP: (120-213)/(53-74) 166/59 (10/08 0700) FiO2 (%):  [40 %] 40 % (10/08 0306) Weight:  [197 lb 5 oz (89.5 kg)] 197 lb 5 oz (89.5 kg) (10/08 0500) Last BM Date: 09/14/17  Weight change: Filed Weights   09/13/17 0500 09/14/17 0400 09/15/17 0500  Weight: 196 lb 13.9 oz (89.3 kg) 192 lb 10.9 oz (87.4 kg) 197 lb 5 oz (89.5 kg)    Intake/Output:   Intake/Output Summary (Last 24 hours) at 09/15/17 0735 Last data filed at 09/15/17 0729  Gross per 24 hour  Intake          3402.87 ml  Output             4445 ml  Net         -1042.13 ml      Physical Exam   General: NAD Neck: JVP 8 cm, no thyromegaly or thyroid nodule.  Lungs: Decreased BS at bases bilaterally.  CV: Nondisplaced PMI.  Heart regular S1/S2, no S3/S4, no murmur.  No peripheral edema.   Abdomen: Soft, nontender, no hepatosplenomegaly, no distention.  Skin: Intact without lesions or rashes.  Neurologic: Alert, follows commands  Psych: Normal affect. Extremities: No clubbing  or cyanosis.  HEENT: Normal.    Telemetry   NSR 70s-80s personally reviewed.   Labs    CBC  Recent Labs  09/14/17 0400 09/14/17 1556 09/15/17 0330  WBC 8.8  --  7.3  HGB 9.5* 9.2* 9.2*  HCT 29.8* 27.0* 29.6*  MCV 91.7  --  91.9  PLT 400  --  149   Basic Metabolic Panel  Recent Labs  09/14/17 0400 09/14/17 1556 09/15/17 0330  NA 142 144 143  K 3.3* 3.8 3.7  CL 104 105 105  CO2 28  --  29  GLUCOSE 245* 288* 261*  BUN 63* 49* 44*  CREATININE 1.34* 1.10 1.06  CALCIUM 8.1*  --  8.3*   Liver Function Tests  Recent Labs  09/14/17 0400 09/15/17 0330  AST 40 34  ALT 29 27  ALKPHOS 79 83  BILITOT 0.5 0.4  PROT 7.1 7.0  ALBUMIN 2.2* 2.1*   No results for input(s): LIPASE, AMYLASE in the last 72 hours. Cardiac Enzymes No results for input(s): CKTOTAL, CKMB, CKMBINDEX, TROPONINI in the last 72 hours.  BNP: BNP (last 3 results)  Recent Labs  08/23/17 1927  BNP 200.0*    ProBNP (last 3 results) No results for input(s): PROBNP  in the last 8760 hours.   D-Dimer No results for input(s): DDIMER in the last 72 hours. Hemoglobin A1C No results for input(s): HGBA1C in the last 72 hours. Fasting Lipid Panel No results for input(s): CHOL, HDL, LDLCALC, TRIG, CHOLHDL, LDLDIRECT in the last 72 hours. Thyroid Function Tests No results for input(s): TSH, T4TOTAL, T3FREE, THYROIDAB in the last 72 hours.  Invalid input(s): FREET3  Other results:   Imaging    Dg Chest Port 1 View  Result Date: 09/15/2017 CLINICAL DATA:  Status post self extubation.  Initial encounter. EXAM: PORTABLE CHEST 1 VIEW COMPARISON:  Chest radiograph performed 09/14/2017 FINDINGS: The patient's endotracheal tube has been removed. Bilateral chest tubes are unchanged in position. A right subclavian line is noted ending about the mid SVC. A left subclavian sheath is noted ending about the left brachiocephalic vein. A mediastinal drain is again noted. A small left pleural effusion is again  noted. Mild bilateral central airspace opacity likely reflects mild interstitial edema. No pneumothorax is seen. The cardiomediastinal silhouette is mildly enlarged. The patient is status post median sternotomy, with findings of prior CABG. No acute osseous abnormalities are seen. IMPRESSION: Small left pleural effusion again noted. Mild stable bilateral central airspace opacity likely reflects mild interstitial edema. Mild cardiomegaly. Electronically Signed   By: Garald Balding M.D.   On: 09/15/2017 04:43     Medications:     Scheduled Medications: . aspirin EC  325 mg Oral Daily   Or  . aspirin  324 mg Per Tube Daily  . atorvastatin  80 mg Per Tube q1800  . bisacodyl  10 mg Oral Daily   Or  . bisacodyl  10 mg Rectal Daily  . brimonidine  1 drop Right Eye BID  . chlorhexidine gluconate (MEDLINE KIT)  15 mL Mouth Rinse BID  . Chlorhexidine Gluconate Cloth  6 each Topical Daily  . docusate  200 mg Oral Daily  . furosemide  20 mg Intravenous BID  . hydrALAZINE  25 mg Oral Q8H  . hydrocortisone sod succinate (SOLU-CORTEF) inj  50 mg Intravenous Daily  . insulin aspart  0-24 Units Subcutaneous Q4H  . insulin glargine  50 Units Subcutaneous BID  . levalbuterol  1.25 mg Nebulization Q6H  . mouth rinse  15 mL Mouth Rinse 10 times per day  . metoCLOPramide (REGLAN) injection  10 mg Intravenous Q6H  . mometasone-formoterol  2 puff Inhalation BID  . mupirocin cream   Topical BID  . pantoprazole (PROTONIX) IV  40 mg Intravenous Q24H  . sodium chloride flush  10-40 mL Intracatheter Q12H  . sodium chloride flush  3 mL Intravenous Q12H  . spironolactone  25 mg Per Tube Daily    Infusions: . sodium chloride    . sodium chloride 20 mL/hr at 09/15/17 0600  . amiodarone 30 mg/hr (09/15/17 0600)  . epinephrine Stopped (09/13/17 1147)  . heparin 1,500 Units/hr (09/15/17 0600)  . lactated ringers Stopped (09/13/17 1702)  . piperacillin-tazobactam (ZOSYN)  IV 3.375 g (09/15/17 0545)  .  potassium chloride Stopped (09/15/17 0729)    PRN Medications: acetaminophen (TYLENOL) oral liquid 160 mg/5 mL, metoprolol tartrate, ondansetron (ZOFRAN) IV, oxyCODONE, sodium chloride flush, sodium chloride flush    Patient Profile   Clinton Harrington is a 66 year old with a history of DMII, smoker, CAD S/P PCI RCA 20 years ago, HTN, PAD, and hyperlipidemia admitted with CP and inferior MI with RV infarction.  He underwent LHC with IABP. After optimized, underwent CABG x4  centrimag for RV infarct on 9/20 and continued IABP.   Assessment/Plan   1. Post-cardiotomy cardiogenic shock: Primarily RV failure from inferior infarct with RV involvement.  Repeat echo on 9/24 showed vigorous LV systolic function with moderate RV dysfunction.  Now s/p CABG and Centrimag for RV support on 9/20. IABP removed 9/24 with good LV function.  Centrimag out 10/1. Currently on milrinone 0.125 only. Good I/Os on low-dose bolus lasix, CVP 8 today. He remains hypertensive.  - Can stop milrinone today.  - Will add hydralazine po for now for HTN.  - Decrease Lasix from tid to bid today, probably to po tomorrow.  - Continue spironolactone 25 mg daily.  2. CAD: Inferior MI with RV involvement at presentation, now s/p CABG.   - He is getting ASA.   - Continue statin.  3. Atrial fibrillation/flutter: DCCV 9/29, now remains in NSR.  - NSR with PACs today. Continue amio 30 mg per hour. Can switch to po when passes swallow. - On heparin drip (had DCCV this admission from atrial fibrillation).   Transition to Eliquis when passes swallow.   4. AKI: Creatinine down to 1.06 today.  5. Acute respiratory failure: Suspect left-sided HCAP, on broad spectrum antibiotics. Improved, he self-extubated this morning and is doing well so far.  6. Neuro: ? L-sided weakness now resolved.  Head CT 10/5 negative.  7. ID: Coag negative Staph on 9/28 blood culture, possible HCAP versus contaminant.  Afebrile. WBC 7.3.  - Blood CX 10/3- NGTD.   -  Covering for HCAP with vancomycin/Zosyn => LLL infiltrate on CXR (per CCM).  8. Anemia: Received 2 uPRBCs 09/04/17. 4 units PRBCs on 9/29. Received 2U PRBCs 10/1.  Hgb 9.2 today. 10. Sacral decubiti  - WOC following 11. Diabetes: Difficult to control, appreciate help from diabetes coordinator. No change.  12. Out of bed/PT   Loralie Champagne MD 09/15/2017 7:35 AM

## 2017-09-15 NOTE — Progress Notes (Signed)
70 cc of Fentanyl wasted in sink. Octaviano Batty RN witnessed.

## 2017-09-15 NOTE — Progress Notes (Signed)
      [address]       [place] [address]             [phone]      Stable day  BP (!) 148/64   Pulse 94   Temp 97.6 F (36.4 C) (Oral)   Resp (!) 30   Ht  (1.803 m)   Wt 197 lb 5 oz (89.5 kg)   SpO2 95%   BMI 27.52 kg/m    Intake/Output Summary (Last 24 hours) at 09/15/17 1740 Last data filed at 09/15/17 1500  Gross per 24 hour  Intake          2208.06 ml  Output             3760 ml  Net         -1551.94 ml   Was up in chair for 1.5 hours today  Viviann Spare C. Dorris Fetch, MD [facility] [phone]

## 2017-09-15 NOTE — Care Management Note (Addendum)
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  Presents  With STEMI, 3 vessel disease, Cardiogenic shock, acute renal failure.  Plans is for CABG this week, conts on milrinone and IABP.     9/18 1547 NCM spoke with Gwenlyn Perking the CSW with [facility], stating that patient's mom will be able to stay there until he is discharged from the hospital,  This information was relayed to the patient and his RN.     9/21 1408 Letha Cape RN, BSN - post op CABG ,  On vent, on amio, precedex, dopamine and norepi.  9/25 1336 Letha Cape RN, BSN - conts to be on vent  POD 5 CABG with centrimag RVAD, afib, NG tube feeds, chest tubes to suction, pressors, amio, milrinone, lasix drip.       10/1 1442 Letha Cape RN, BSN - Removal of RVAD today, sternum closed, conts on vent, CVP  20, pressor/inotrope support, nitor oxide, cont to diurese, amio  10/5 1107 Letha Cape RN,BSN- conts on full vent support, afib, hcap, if not extubated this weekend, will need Trach per MD note.  conts on eipi, milrinone, amio.   10/8 1005 Letha Cape RN, BSN - Extubated today, s/p CABG with IABP removal and RV removal, now with recurrent afib s/p cardioversion and being treated for HCAP with vanc and zosyn, conts on milrinone, hep drip, amio drip.  [facility] consulted for EMMI.    10/10 1253 Letha Cape RN, BSN - 10/9 resp distress ,now trached and reintubated.  conts on fentanyl and zosyn.                            Action/Plan:   Expected Discharge Date:                  Expected Discharge Plan:     In-House Referral:     Discharge planning Services  CM Consult  Post Acute Care Choice:    Choice offered to:     DME Arranged:    DME Agency:     HH Arranged:    HH Agency:     Status of Service:  In process, will continue to follow  If discussed at Long  Length of Stay Meetings, dates discussed:    Additional Comments:  Leone Haven, RN 09/15/2017, 2:08 PM

## 2017-09-15 NOTE — Progress Notes (Signed)
Inpatient Diabetes Program Recommendations  AACE/ADA: New Consensus Statement on Inpatient Glycemic Control (2015)  Target Ranges:  Prepandial:   less than 140 mg/dL      Peak postprandial:   less than 180 mg/dL (1-2 hours)      Critically ill patients:  140 - 180 mg/dL   Lab Results  Component Value Date   GLUCAP 85 09/15/2017   HGBA1C 5.9 (H) 08/25/2017    Review of Glycemic ControlResults for TYLAND, KLEMENS (MRN [id number]) as of 09/15/2017 12:28  Ref. Range 09/15/2017 03:04 09/15/2017 07:46 09/15/2017 11:49  Glucose-Capillary Latest Ref Range: 65 - 99 mg/dL 409 (H) 811 (H) 85    Diabetes history: Type 2 diabetes Outpatient Diabetes medications: Amaryl 4 mg bid, Metformin 1000 mg bid Current orders for Inpatient glycemic control:  Lantus 30 units bid, TCTS q 4 hours  Inpatient Diabetes Program Recommendations:    Note that patient self-extubated.  Tube feeds off.  CBG's have decreased.  May need to add Dextrose to IV fluids?  Also recommend holding Lantus until CBG's>150 mg/dL.  Discussed with RN.  She states she will discuss with rounding MD.   Thanks, Beryl Meager, RN, BC-ADM Inpatient Diabetes Coordinator Pager [phone] (8a-5p)

## 2017-09-15 NOTE — Evaluation (Signed)
Physical Therapy Evaluation Patient Details Name: Clinton Harrington MRN: [id number] DOB: 08-16-1951 Today's Date: 09/15/2017   History of Present Illness  Patient is a 66 y/o Caucasian M with hx CADs/p BMS to RCA in 1999 (due to an inferior STEMI), significant PVD, HTN, Type 2 DM (non-insulin dependent), HLP, Tobacco use comes in today complaining of substernal chest pain that started last night at 11 pm. He had associated SOB, nausea, diaphoresis. Pt underwent CABGx4 on 9/20. Pt then underwent removal of R VAD with cardiopulmonary bypass on 10/1. Pt self extubated on 10/8.  Clinical Impression  Pt admitted with above. Pt extremely deconditioned. Pt was independent PTA now required maxAx2 for all mobility. Recommend aggressive rehab program for maximal functionally recovery. Acute PT to con't to follow.    Follow Up Recommendations CIR;Supervision/Assistance - 24 hour    Equipment Recommendations   (TBD)    Recommendations for Other Services Rehab consult     Precautions / Restrictions Precautions Precautions: Sternal Precaution Comments: pt educated on sternal precautions however unable to recall and no verbal understanding Restrictions Weight Bearing Restrictions: Yes Other Position/Activity Restrictions: sternal prec      Mobility  Bed Mobility Overal bed mobility: Needs Assistance Bed Mobility: Sit to Supine       Sit to supine: Max assist;+2 for physical assistance   General bed mobility comments: pt unable to bring LEs back up into bed without assist, requires assist for control descent to trunk  Transfers Overall transfer level: Needs assistance Equipment used:  (2 person lateral scoot with bed pad) Transfers: Lateral/Scoot Transfers          Lateral/Scoot Transfers: +2 physical assistance;Total assist General transfer comment: maxAx2 for lateral scoot/squat pivot transfer to from chair to EOB wtih bed pad and arm of drop arm recliner dropped, transfered to the  R, pt unable to achieve full standing  Ambulation/Gait             General Gait Details: currenlty unable  Stairs            Wheelchair Mobility    Modified Rankin (Stroke Patients Only)       Balance Overall balance assessment: Needs assistance Sitting-balance support: Feet supported;Bilateral upper extremity supported Sitting balance-Leahy Scale: Poor Sitting balance - Comments: with max v/c's and tactile cues pt able to achieve midline position and maintain x 10 sec, otherwise pt with posterior lean   Standing balance support:  (unable to achieve)                                 Pertinent Vitals/Pain Pain Assessment: Faces Faces Pain Scale: Hurts little more Pain Location: "my butt" Pain Descriptors / Indicators:  (unable to relay) Pain Intervention(s): Monitored during session    Home Living Family/patient expects to be discharged to:: Inpatient rehab                 Additional Comments: pt lives with mother who had dementia in a 1 story home with one step to enter, has walk in shower    Prior Function Level of Independence: Independent         Comments: 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     Hand Dominance   Dominant Hand: Right    Extremity/Trunk Assessment   Upper Extremity Assessment Upper Extremity Assessment:  (didn't complete MMT due to sternal precautions)    Lower Extremity Assessment Lower Extremity Assessment: Generalized weakness (grossly  3-/5)    Cervical / Trunk Assessment Cervical / Trunk Assessment:  (sternal incision)  Communication   Communication: No difficulties  Cognition Arousal/Alertness: Awake/alert Behavior During Therapy: WFL for tasks assessed/performed Overall Cognitive Status: Impaired/Different from baseline Area of Impairment: Problem solving                             Problem Solving: Slow processing;Decreased initiation;Difficulty sequencing;Requires verbal cues;Requires  tactile cues General Comments: pt followed one step commands however demo'd impaired comprehension      General Comments General comments (skin integrity, edema, etc.): pt with chest tube    Exercises     Assessment/Plan    PT Assessment Patient needs continued PT services  PT Problem List Decreased strength;Decreased range of motion;Decreased activity tolerance;Decreased balance;Decreased mobility;Decreased coordination;Decreased cognition;Decreased knowledge of use of DME;Decreased safety awareness;Decreased knowledge of precautions;Cardiopulmonary status limiting activity;Pain       PT Treatment Interventions DME instruction;Gait training;Stair training;Functional mobility training;Therapeutic activities;Therapeutic exercise;Balance training;Cognitive remediation;Neuromuscular re-education;Patient/family education    PT Goals (Current goals can be found in the Care Plan section)  Acute Rehab PT Goals Patient Stated Goal: didn't state PT Goal Formulation: With patient Time For Goal Achievement: 09/29/17 Potential to Achieve Goals: Good    Frequency Min 3X/week   Barriers to discharge Decreased caregiver support primary caregiver to mom    Co-evaluation               AM-PAC PT "6 Clicks" Daily Activity  Outcome Measure Difficulty turning over in bed (including adjusting bedclothes, sheets and blankets)?: Unable Difficulty moving from lying on back to sitting on the side of the bed? : Unable Difficulty sitting down on and standing up from a chair with arms (e.g., wheelchair, bedside commode, etc,.)?: Unable Help needed moving to and from a bed to chair (including a wheelchair)?: Total Help needed walking in hospital room?: Total Help needed climbing 3-5 steps with a railing? : Total 6 Click Score: 6    End of Session Equipment Utilized During Treatment: Oxygen (chest tube) Activity Tolerance: Patient tolerated treatment well Patient left: in bed;with call  bell/phone within reach;with nursing/sitter in room Nurse Communication: Mobility status PT Visit Diagnosis: Unsteadiness on feet (R26.81);Muscle weakness (generalized) (M62.81);Difficulty in walking, not elsewhere classified (R26.2)    Time: 1414-1440 PT Time Calculation (min) (ACUTE ONLY): 26 min   Charges:   PT Evaluation $PT Eval Moderate Complexity: 1 Mod PT Treatments $Therapeutic Activity: 8-22 mins   PT G Codes:        Lewis Shock, PT, DPT Pager #: [phone] Office #: [phone]   Kerly Rigsbee M Salik Grewell 09/15/2017, 3:06 PM

## 2017-09-15 NOTE — Progress Notes (Signed)
Rehab Admissions Coordinator Note:  Patient was screened by Trish Mage for appropriateness for an Inpatient Acute Rehab Consult.  At this time, we are recommending Inpatient Rehab consult.  Lelon Frohlich M 09/15/2017, 3:21 PM  I can be reached at [phone].

## 2017-09-15 NOTE — Progress Notes (Signed)
#   3. S/W [place] @ [facility] [facility] # [phone] OPT- 2   1. ELIQUIS 2.5 MG BID  COVER- YES  CO-PAY0 $ 45.00  TIER- 3 DRUG  PRIOR APPROVAL- NO   2. ELIQUIS 5 MG BIS  COVER- YES  CO-PAY- $ 45.00  TIER- 3 DRUG  PRIOR APPROVAL- NO    PREFERRED PHARMACY: [facility] AND [facility]

## 2017-09-15 NOTE — Progress Notes (Signed)
7 Days Post-Op Procedure(s) (LRB): REMOVAL OF R-VENTRICULAR ASSIST DEVICE WITH CARDIOPULMONARY BYPASS (N/A) TRANSESOPHAGEAL ECHOCARDIOGRAM (TEE) (N/A) Subjective: Status post emergency CABG with right ventricular assist device for completed RV infarct No decannulated after RV assist device, extubated, out of bed to chair Maintaining sinus rhythm Very hoarse and unable to swallow after 2 weeks of intubation Unable to clear secretions-patient was tracheal suctioned by myself of heavy secretions Not ready for swallow evaluation yet. Weaned off inotropes, continue IV amiodarone Diuresing well with IV Lasix 3 times a day  Objective: Vital signs in last 24 hours: Temp:  [97.9 F (36.6 C)-98.8 F (37.1 C)] 98.6 F (37 C) (10/08 1152) Pulse Rate:  [62-95] 93 (10/08 1500) Cardiac Rhythm: Normal sinus rhythm (10/08 0751) Resp:  [19-35] 28 (10/08 1500) BP: (109-167)/(60-76) 148/64 (10/08 0800) SpO2:  [93 %-100 %] 96 % (10/08 1500) Arterial Line BP: (121-213)/(53-74) 153/61 (10/08 1500) FiO2 (%):  [40 %] 40 % (10/08 0306) Weight:  [197 lb 5 oz (89.5 kg)] 197 lb 5 oz (89.5 kg) (10/08 0500)  Hemodynamic parameters for last 24 hours: CVP:  [5 mmHg-11 mmHg] 5 mmHg  Intake/Output from previous day: 10/07 0701 - 10/08 0700 In: 3308.4 [I.V.:1668.4; NG/GT:1340; IV Piggyback:300] Out: 3995 [Urine:3925; Chest Tube:70] Intake/Output this shift: Total I/O In: 424.1 [I.V.:274.1; IV Piggyback:150] Out: 950 [Urine:800; Chest Tube:150]       Exam    General- alert and comfortable   Lungs- clear without rales, wheezes   Cor- regular rate and rhythm, no murmur , gallop   Abdomen- soft, non-tender   Extremities - warm, non-tender, minimal edema   Neuro- oriented, appropriate, no focal weakness    Lab Results:  Recent Labs  09/14/17 0400 09/14/17 1556 09/15/17 0330  WBC 8.8  --  7.3  HGB 9.5* 9.2* 9.2*  HCT 29.8* 27.0* 29.6*  PLT 400  --  378   BMET:  Recent Labs  09/14/17 0400  09/14/17 1556 09/15/17 0330  NA 142 144 143  K 3.3* 3.8 3.7  CL 104 105 105  CO2 28  --  29  GLUCOSE 245* 288* 261*  BUN 63* 49* 44*  CREATININE 1.34* 1.10 1.06  CALCIUM 8.1*  --  8.3*    PT/INR: No results for input(s): LABPROT, INR in the last 72 hours. ABG    Component Value Date/Time   PHART 7.489 (H) 09/15/2017 0518   HCO3 31.8 (H) 09/15/2017 0518   TCO2 33 (H) 09/15/2017 0518   ACIDBASEDEF 0.5 09/10/2017 0325   O2SAT 96.0 09/15/2017 0518   CBG (last 3)   Recent Labs  09/15/17 0304 09/15/17 0746 09/15/17 1149  GLUCAP 219* 101* 85    Assessment/Plan: S/P Procedure(s) (LRB): REMOVAL OF R-VENTRICULAR ASSIST DEVICE WITH CARDIOPULMONARY BYPASS (N/A) TRANSESOPHAGEAL ECHOCARDIOGRAM (TEE) (N/A) Hemodynamically stable but very weak We'll ask for physical therapy input Will need swallow evaluation later this week Remove Swan sheath, remove A-line, start physical therapy   LOS: 23 days    Clinton Harrington 09/15/2017

## 2017-09-15 NOTE — Progress Notes (Signed)
PULMONARY / CRITICAL CARE MEDICINE   Name: Clinton Harrington MRN: [id number] DOB: Apr 26, 1951    ADMISSION DATE:  08/23/2017 CONSULTATION DATE:  08/30/2017  REFERRING MD:  Cornelius Moras  CHIEF COMPLAINT:  Acute respiratory failure with hypoxemia  BRIEF:  66 y/o male with CAD who had an MI on 9/15, had severe RV failure, was treated with CABG, IABP, eventual centrimag device placed.  Post operatively was weaned off of the RVAD device on 10/1, remains mechanically ventilated.  Currently being treated for LLL HCAP.  SUBJECTIVE:  Agitation, self extubation  VITAL SIGNS: BP (!) 148/64   Pulse 89   Temp 98.8 F (37.1 C) (Oral)   Resp (!) 26   Ht  (1.803 m)   Wt 89.5 kg (197 lb 5 oz)   SpO2 95%   BMI 27.52 kg/m   HEMODYNAMICS: CVP:  [5 mmHg-11 mmHg] 5 mmHg  VENTILATOR SETTINGS: Vent Mode: PRVC FiO2 (%):  [40 %] 40 % Set Rate:  [20 bmp] 20 bmp Vt Set:  [560 mL] 560 mL PEEP:  [5 cmH20] 5 cmH20 Pressure Support:  [10 cmH20] 10 cmH20 Plateau Pressure:  [19 cmH20-22 cmH20] 20 cmH20  INTAKE / OUTPUT: I/O last 3 completed shifts: In: 5208 [I.V.:2558; NG/GT:2150; IV Piggyback:500] Out: 7250 [Urine:7130; Chest Tube:120]  PHYSICAL EXAMINATION:  General:  Awake and interactive HENT: [place]/AT, PERRL, EOM-I and MMM PULM: Transmitted upper airway sounds noted,a irway protection and clearance are questionable at best CV: RRR, Nl S1/S2 and -M/R/G GI: Soft, NT, ND and +BS MSK: Normal bulk and tone Neuro: Awake on vent, follow commands  LABS:  BMET  Recent Labs Lab 09/13/17 0319  09/14/17 0400 09/14/17 1556 09/15/17 0330  NA 137  < > 142 144 143  K 3.4*  < > 3.3* 3.8 3.7  CL 99*  < > 104 105 105  CO2 28  --  28  --  29  BUN 69*  < > 63* 49* 44*  CREATININE 1.70*  < > 1.34* 1.10 1.06  GLUCOSE 192*  < > 245* 288* 261*  < > = values in this interval not displayed.  Electrolytes  Recent Labs Lab 09/09/17 1514 09/10/17 0232  09/12/17 0508 09/13/17 0319 09/14/17 0400  09/15/17 0330  CALCIUM  --  7.2*  < > 8.1* 7.8* 8.1* 8.3*  MG 3.0* 2.6*  --  2.2  --   --   --   PHOS  --  4.3  --  3.3  --   --   --   < > = values in this interval not displayed.  CBC  Recent Labs Lab 09/13/17 0319  09/14/17 0400 09/14/17 1556 09/15/17 0330  WBC 8.8  --  8.8  --  7.3  HGB 8.9*  < > 9.5* 9.2* 9.2*  HCT 28.3*  < > 29.8* 27.0* 29.6*  PLT 371  --  400  --  378  < > = values in this interval not displayed.  Coag's  Recent Labs Lab 09/08/17 1145  APTT 39*  INR 1.58   Sepsis Markers No results for input(s): LATICACIDVEN, PROCALCITON, O2SATVEN in the last 168 hours.  ABG  Recent Labs Lab 09/14/17 0410 09/15/17 0354 09/15/17 0518  PHART 7.476* 7.476* 7.489*  PCO2ART 40.1 42.8 41.7  PO2ART 88.0 120.0* 73.0*   Liver Enzymes  Recent Labs Lab 09/13/17 0319 09/14/17 0400 09/15/17 0330  AST 52* 40 34  ALT ALKPHOS 76 79 83  BILITOT 0.7 0.5 0.4  ALBUMIN 2.1* 2.2* 2.1*   Cardiac Enzymes No results for input(s): TROPONINI, PROBNP in the last 168 hours.  Glucose  Recent Labs Lab 09/14/17 0807 09/14/17 1134 09/14/17 2023 09/14/17 2334 09/15/17 0304 09/15/17 0746  GLUCAP 215* 226* 200* 236* 219* 101*   Imaging Dg Chest Port 1 View  Result Date: 09/15/2017 CLINICAL DATA:  Status post self extubation.  Initial encounter. EXAM: PORTABLE CHEST 1 VIEW COMPARISON:  Chest radiograph performed 09/14/2017 FINDINGS: The patient's endotracheal tube has been removed. Bilateral chest tubes are unchanged in position. A right subclavian line is noted ending about the mid SVC. A left subclavian sheath is noted ending about the left brachiocephalic vein. A mediastinal drain is again noted. A small left pleural effusion is again noted. Mild bilateral central airspace opacity likely reflects mild interstitial edema. No pneumothorax is seen. The cardiomediastinal silhouette is mildly enlarged. The patient is status post median sternotomy, with findings of  prior CABG. No acute osseous abnormalities are seen. IMPRESSION: Small left pleural effusion again noted. Mild stable bilateral central airspace opacity likely reflects mild interstitial edema. Mild cardiomegaly. Electronically Signed   By: Roanna Raider M.D.   On: 09/15/2017 04:43   10/7 CXR images indepedently reviewed, worsening consolidation on left vs difference in radiographic technicque (less penetrated)  CULTURES: 9/22 sputum>>>ng 9/24>> Sputum>>ng 9/24 Blood>>NEG  9/24 Urine>>ng 9/28 BC x 2 >coag negative staph 1/4 10/2>> respiratory flora, candida albicans  ANTIBIOTICS: zinacef >>>9/22- 9/24 ceftaz 9/22>>>9/28 Vanc 9/20>>  Zosyn 9/28 >>  SIGNIFICANT EVENTS: 08/23/2017 - admit 9/22 - intubated Cath cabg RV assist device 9/23 - On epi/levo/RVAD/vent.  CI 1.8 despite all interventions 9/24>> Epi, Levo, milrinine,amio,insulin gtt, Lasix gtt, Heparin, Fent, Versed 9/26 - Remains sedated on fent/ versed drips RVAD flows down to 3200 Good UO on lasix gtt @ 15 9/27 - 40% fio2, peep 8. RPM on RVAD turned down. Stil on levophed, milrinone, epi ggtt and heparin.admio gtt. Making urine. Has chest draines. Good urine outoput. Heparin on hold briefly before resumptions due to bleed in groin site. Getting 2 UI PRBC 9/30 mucous plugging requiring multiple lavages 10/1 RV assist device  removed  in the OR 2/2 bleeding 1-0/2 - Stable off RVAD, remains on levo, vaso,Epi, milrinone  STUDIES: 10/6 CT head > NAICP, may need MRI to assess further  Lines/tubes: 10/1 R subclavian CVL >  ETT out 10/8  DISCUSSION: 66 y/o male s/p MI, RV failure, CABG, centrimag RV support deviced (now weaned off) who remains intubated for respiratory failure.  Currently being treated for HCAP.   ASSESSMENT / PLAN:  PULMONARY A: Acute respiratory failure with hypoxemia > improving HCAP > infiltrate worse on left P:   Titrate O2 for sat of 88-92% Monitor for airway clearance, concerned after  exam IS Flutter valve Ambulate as able SLP If needs reintubation then will need trach  CARDIOVASCULAR A:  Cardiogenic shock > improving CAD, s/p MI and CABG RV failure P:  Vasopressors/arrhythmia meds per cardiology TCTS Tele  RENAL A:   AKI>  improving P:   Monitor BMET and UOP Replace electrolytes as needed  GASTROINTESTINAL A:   No acute issues P:   Continue tube feeding  HEMATOLOGIC A:   No acute issues P:  Monitor for bleeding  INFECTIOUS A:   HCAP, fever on 10/6 Coag neg staph in blood> most likely contaminant P:   Trach aspirate NTD Continue vanc/zosyn Diflucan off  ENDOCRINE A:   DM2, persistent hyperglycemia   P:   Continue SSI  Glargine 50 BID > monitor on this dose today  NEUROLOGIC A:   10/5 New left sided weakness> resolved as of my exam 10/6 P:   D/C all sedating medications  FAMILY  - Updates:  Patient updated bedside  Hold in the ICU for monitoring, I am concerned for airway protection  The patient is critically ill with multiple organ systems failure and requires high complexity decision making for assessment and support, frequent evaluation and titration of therapies, application of advanced monitoring technologies and extensive interpretation of multiple databases.   Critical Care Time devoted to patient care services described in this note is  35  Minutes. This time reflects time of care of this signee Dr Koren Bound. This critical care time does not reflect procedure time, or teaching time or supervisory time of PA/NP/Med student/Med Resident etc but could involve care discussion time.  Alyson Reedy, M.D. [facility] Pulmonary/Critical Care Medicine. Pager: [phone]. After hours pager: [phone].  09/15/2017, 9:46 AM

## 2017-09-15 NOTE — Progress Notes (Signed)
Vent alarming, ET tube noted to be out, pt calm and stable. RT called, Elink MD called. 4L [place] oxygen placed on pt. Voice coaching, incentive spirometer, mouth care, deep breathing, coughing & oral suctioning done. Pt remains stable on 4L [place], will continue to monitor.

## 2017-09-16 ENCOUNTER — Inpatient Hospital Stay (HOSPITAL_COMMUNITY): Payer: PPO

## 2017-09-16 ENCOUNTER — Other Ambulatory Visit (HOSPITAL_COMMUNITY): Payer: PPO

## 2017-09-16 DIAGNOSIS — R1310 Dysphagia, unspecified: Secondary | ICD-10-CM

## 2017-09-16 LAB — GLUCOSE, CAPILLARY
GLUCOSE-CAPILLARY: 106 mg/dL — AB (ref 65–99)
Glucose-Capillary: 121 mg/dL — ABNORMAL HIGH (ref 65–99)
Glucose-Capillary: 127 mg/dL — ABNORMAL HIGH (ref 65–99)
Glucose-Capillary: 144 mg/dL — ABNORMAL HIGH (ref 65–99)
Glucose-Capillary: 148 mg/dL — ABNORMAL HIGH (ref 65–99)
Glucose-Capillary: 90 mg/dL (ref 65–99)

## 2017-09-16 LAB — BLOOD GAS, ARTERIAL
Acid-Base Excess: 7.6 mmol/L — ABNORMAL HIGH (ref 0.0–2.0)
Bicarbonate: 30.8 mmol/L — ABNORMAL HIGH (ref 20.0–28.0)
Drawn by: 418751
FIO2: 100
O2 Saturation: 91 %
Patient temperature: 97.6
pCO2 arterial: 36.3 mmHg (ref 32.0–48.0)
pH, Arterial: 7.536 — ABNORMAL HIGH (ref 7.350–7.450)
pO2, Arterial: 55.1 mmHg — ABNORMAL LOW (ref 83.0–108.0)

## 2017-09-16 LAB — COOXEMETRY PANEL
Carboxyhemoglobin: 1.6 % — ABNORMAL HIGH (ref 0.5–1.5)
Methemoglobin: 0.9 % (ref 0.0–1.5)
O2 Saturation: 69.4 %
Total hemoglobin: 10.9 g/dL — ABNORMAL LOW (ref 12.0–16.0)

## 2017-09-16 LAB — COMPREHENSIVE METABOLIC PANEL
ALT: 30 U/L (ref 17–63)
AST: 39 U/L (ref 15–41)
Albumin: 2.4 g/dL — ABNORMAL LOW (ref 3.5–5.0)
Alkaline Phosphatase: 76 U/L (ref 38–126)
Anion gap: 8 (ref 5–15)
BUN: 39 mg/dL — ABNORMAL HIGH (ref 6–20)
CO2: 30 mmol/L (ref 22–32)
Calcium: 8.5 mg/dL — ABNORMAL LOW (ref 8.9–10.3)
Chloride: 108 mmol/L (ref 101–111)
Creatinine, Ser: 1.18 mg/dL (ref 0.61–1.24)
GFR calc Af Amer: >60 mL/min (ref >60)
GFR calc non Af Amer: >60 mL/min (ref >60)
Glucose, Bld: 97 mg/dL (ref 65–99)
Potassium: 3.8 mmol/L (ref 3.5–5.1)
Sodium: 146 mmol/L — ABNORMAL HIGH (ref 135–145)
Total Bilirubin: 0.6 mg/dL (ref 0.3–1.2)
Total Protein: 7.4 g/dL (ref 6.5–8.1)

## 2017-09-16 LAB — MAGNESIUM: Magnesium: 2.2 mg/dL (ref 1.7–2.4)

## 2017-09-16 LAB — POCT I-STAT, CHEM 8
BUN: 44 mg/dL — ABNORMAL HIGH (ref 6–20)
CALCIUM ION: 1.13 mmol/L — AB (ref 1.15–1.40)
CHLORIDE: 109 mmol/L (ref 101–111)
Creatinine, Ser: 1.6 mg/dL — ABNORMAL HIGH (ref 0.61–1.24)
GLUCOSE: 135 mg/dL — AB (ref 65–99)
HCT: 28 % — ABNORMAL LOW (ref 39.0–52.0)
HEMOGLOBIN: 9.5 g/dL — AB (ref 13.0–17.0)
POTASSIUM: 4.2 mmol/L (ref 3.5–5.1)
SODIUM: 149 mmol/L — AB (ref 135–145)
TCO2: 29 mmol/L (ref 22–32)

## 2017-09-16 LAB — PHOSPHORUS: Phosphorus: 3.2 mg/dL (ref 2.5–4.6)

## 2017-09-16 LAB — CBC
HCT: 32.4 % — ABNORMAL LOW (ref 39.0–52.0)
Hemoglobin: 10.2 g/dL — ABNORMAL LOW (ref 13.0–17.0)
MCH: 29.2 pg (ref 26.0–34.0)
MCHC: 31.5 g/dL (ref 30.0–36.0)
MCV: 92.8 fL (ref 78.0–100.0)
Platelets: 408 10*3/uL — ABNORMAL HIGH (ref 150–400)
RBC: 3.49 MIL/uL — ABNORMAL LOW (ref 4.22–5.81)
RDW: 15.7 % — ABNORMAL HIGH (ref 11.5–15.5)
WBC: 10 10*3/uL (ref 4.0–10.5)

## 2017-09-16 LAB — HEPARIN LEVEL (UNFRACTIONATED): Heparin Unfractionated: 0.77 IU/mL — ABNORMAL HIGH (ref 0.30–0.70)

## 2017-09-16 LAB — PROTIME-INR
INR: 1.37
Prothrombin Time: 16.8 seconds — ABNORMAL HIGH (ref 11.4–15.2)

## 2017-09-16 MED ORDER — PROPOFOL 500 MG/50ML IV EMUL: 5.0000 ug/kg/min | Freq: Once | INTRAVENOUS | Status: DC

## 2017-09-16 MED ORDER — HALOPERIDOL LACTATE 5 MG/ML IJ SOLN
INTRAMUSCULAR | Status: AC
  Filled 2017-09-16: qty 1

## 2017-09-16 MED ORDER — MIDAZOLAM HCL 2 MG/2ML IJ SOLN
4.0000 mg | Freq: Once | INTRAMUSCULAR | Status: AC
  Administered 2017-09-17: 2 mg via INTRAVENOUS
  Filled 2017-09-16: qty 4

## 2017-09-16 MED ORDER — FENTANYL CITRATE (PF) 100 MCG/2ML IJ SOLN: 200.0000 ug | Freq: Once | INTRAMUSCULAR | Status: DC

## 2017-09-16 MED ORDER — HALOPERIDOL LACTATE 5 MG/ML IJ SOLN
2.0000 mg | INTRAMUSCULAR | Status: DC | PRN
  Administered 2017-09-16: 2 mg via INTRAVENOUS

## 2017-09-16 MED ORDER — ROCURONIUM BROMIDE 50 MG/5ML IV SOLN
50.0000 mg | Freq: Once | INTRAVENOUS | Status: AC
  Administered 2017-09-16: 50 mg via INTRAVENOUS
  Filled 2017-09-16: qty 5

## 2017-09-16 MED ORDER — MIDAZOLAM HCL 2 MG/2ML IJ SOLN: 2.0000 mg | INTRAMUSCULAR | Status: DC | PRN

## 2017-09-16 MED ORDER — VECURONIUM BROMIDE 10 MG IV SOLR: 10.0000 mg | Freq: Once | INTRAVENOUS | Status: DC

## 2017-09-16 MED ORDER — MIDAZOLAM HCL 2 MG/2ML IJ SOLN: 4.0000 mg | Freq: Once | INTRAMUSCULAR | Status: DC

## 2017-09-16 MED ORDER — AMIODARONE HCL 200 MG PO TABS
200.0000 mg | ORAL_TABLET | Freq: Two times a day (BID) | ORAL | Status: DC
  Administered 2017-09-16 – 2017-09-21 (×12): 200 mg
  Filled 2017-09-16 (×13): qty 1

## 2017-09-16 MED ORDER — INSULIN GLARGINE 100 UNIT/ML ~~LOC~~ SOLN: 20.0000 [IU] | Freq: Every day | SUBCUTANEOUS | Status: DC

## 2017-09-16 MED ORDER — FENTANYL CITRATE (PF) 100 MCG/2ML IJ SOLN
INTRAMUSCULAR | Status: AC
  Filled 2017-09-16: qty 2

## 2017-09-16 MED ORDER — WARFARIN SODIUM 2.5 MG PO TABS
2.5000 mg | ORAL_TABLET | Freq: Every day | ORAL | Status: DC
  Administered 2017-09-16 – 2017-09-17 (×2): 2.5 mg
  Filled 2017-09-16 (×2): qty 1

## 2017-09-16 MED ORDER — FENTANYL CITRATE (PF) 100 MCG/2ML IJ SOLN
200.0000 ug | Freq: Once | INTRAMUSCULAR | Status: AC
  Administered 2017-09-17: 100 ug via INTRAVENOUS
  Filled 2017-09-16: qty 4

## 2017-09-16 MED ORDER — MIDAZOLAM HCL 2 MG/2ML IJ SOLN
INTRAMUSCULAR | Status: AC
  Filled 2017-09-16: qty 2

## 2017-09-16 MED ORDER — FENTANYL 2500MCG IN NS 250ML (10MCG/ML) PREMIX INFUSION
25.0000 ug/h | INTRAVENOUS | Status: DC
  Administered 2017-09-16 – 2017-09-17 (×2): 75 ug/h via INTRAVENOUS
  Filled 2017-09-16 (×2): qty 250

## 2017-09-16 MED ORDER — FENTANYL CITRATE (PF) 100 MCG/2ML IJ SOLN
100.0000 ug | Freq: Once | INTRAMUSCULAR | Status: AC
  Administered 2017-09-16: 100 ug via INTRAVENOUS

## 2017-09-16 MED ORDER — VECURONIUM BROMIDE 10 MG IV SOLR
10.0000 mg | Freq: Once | INTRAVENOUS | Status: AC
  Administered 2017-09-17: 10 mg via INTRAVENOUS

## 2017-09-16 MED ORDER — PROPOFOL 500 MG/50ML IV EMUL
5.0000 ug/kg/min | Freq: Once | INTRAVENOUS | Status: DC
  Filled 2017-09-16: qty 50

## 2017-09-16 MED ORDER — FENTANYL BOLUS VIA INFUSION
50.0000 ug | INTRAVENOUS | Status: DC | PRN
  Filled 2017-09-16: qty 50

## 2017-09-16 MED ORDER — ETOMIDATE 2 MG/ML IV SOLN: 40.0000 mg | Freq: Once | INTRAVENOUS | Status: DC

## 2017-09-16 MED ORDER — FUROSEMIDE 10 MG/ML IJ SOLN
40.0000 mg | Freq: Once | INTRAMUSCULAR | Status: AC
  Administered 2017-09-16: 40 mg via INTRAVENOUS
  Filled 2017-09-16: qty 4

## 2017-09-16 MED ORDER — FENTANYL CITRATE (PF) 100 MCG/2ML IJ SOLN: 50.0000 ug | Freq: Once | INTRAMUSCULAR | Status: AC

## 2017-09-16 MED ORDER — MIDAZOLAM HCL 2 MG/2ML IJ SOLN
2.0000 mg | Freq: Once | INTRAMUSCULAR | Status: AC
  Administered 2017-09-16: 2 mg via INTRAVENOUS

## 2017-09-16 MED ORDER — POTASSIUM CHLORIDE 10 MEQ/50ML IV SOLN
10.0000 meq | INTRAVENOUS | Status: AC
  Administered 2017-09-16 (×2): 10 meq via INTRAVENOUS
  Filled 2017-09-16: qty 50

## 2017-09-16 MED ORDER — ETOMIDATE 2 MG/ML IV SOLN
20.0000 mg | Freq: Once | INTRAVENOUS | Status: AC
  Administered 2017-09-16: 20 mg via INTRAVENOUS

## 2017-09-16 MED ORDER — ETOMIDATE 2 MG/ML IV SOLN
40.0000 mg | Freq: Once | INTRAVENOUS | Status: AC
  Administered 2017-09-17: 20 mg via INTRAVENOUS

## 2017-09-16 MED ORDER — MIDAZOLAM HCL 2 MG/2ML IJ SOLN
2.0000 mg | INTRAMUSCULAR | Status: DC | PRN
  Filled 2017-09-16: qty 2

## 2017-09-16 NOTE — Progress Notes (Signed)
ANTICOAGULATION CONSULT NOTE - Follow-Up Consult  Pharmacy Consult for heparin Indication: atrial fibrillation  Allergies  Allergen Reactions  . Aspirin Other (See Comments)    Stomach upset  . Latex Rash  . Oxycodone-Acetaminophen Itching    Patient Measurements: Height: $Remove 80.3 cm) Weight: 186 lb 11.7 oz (84.7 kg) IBW/kg (Calculated) : 75.3 Heparin Dosing Weight: 94.1kg   Vital Signs: Temp: 97.4 F (36.3 C) (10/09 1132) Temp Source: Axillary (10/09 1132) BP: 109/71 (10/09 1300) Pulse Rate: 94 (10/09 1300)  Labs:  Recent Labs  09/14/17 0400 09/14/17 1335  09/15/17 0330 09/15/17 1641 09/16/17 0305  HGB 9.5*  --   < > 9.2* 9.9* 10.2*  HCT 29.8*  --   < > 29.6* 29.0* 32.4*  PLT 400  --   --  378  --  408*  LABPROT  --   --   --   --   --  16.8*  INR  --   --   --   --   --  1.37  HEPARINUNFRC 0.52 0.51  --  0.43  --  0.77*  CREATININE 1.34*  --   < > 1.06 0.90 1.18  < > = values in this interval not displayed.  Estimated Creatinine Clearance: 68.2 mL/min (by C-G formula based on SCr of 1.18 mg/dL).  . sodium chloride    . sodium chloride 20 mL/hr at 09/15/17 0600  . dextrose 5 % and 0.45% NaCl 30 mL/hr at 09/15/17 2000  . fentaNYL infusion INTRAVENOUS 75 mcg/hr (09/16/17 1010)  . lactated ringers Stopped (09/13/17 1702)  . piperacillin-tazobactam (ZOSYN)  IV Stopped (09/16/17 0926)  . potassium chloride Stopped (09/15/17 1754)  . [START ON 09/17/2017] propofol       Assessment: 73 yoM s/p IABP removal and Centrimag now with recurrent AFib s/p DCCV to start on heparin. Pt noted to have blood chest tube output this morning along with elevated heparin level - per TCTS hold heparin pending trach tomorrow, but ok start warfarin per MD tonight.  Goal of Therapy:  Heparin Level 0.35-0.5 units/ml Monitor platelets by anticoagulation protocol: Yes   Plan:  -Hold heparin for now -Follow-up resuming tomorrow -Warfarin per MD -Daily INR  Fredonia Highland,  PharmD PGY-2 Cardiology Pharmacy Resident Pager: [phone] 09/16/2017

## 2017-09-16 NOTE — Progress Notes (Signed)
PCCM INTERVAL PROGRESS NOTE  Called to bedside for patient in respiratory distress and hypoxia. 65 year old with complicated prolonged medical course following emergent CABG. Recently complicated by HCAP and pulmonary edema. Self extubated about 24 hours ago. RN reports he has weak cough and has required frequent NTS. Upon my arrival crackles throughout. RR low 30s. O2 sats 85% and refusing to wean venti mask. Sats improved on HFNC 13L. WOB improved. CXR consistent with worsening pulmonary edema despite ongoing diuresis.   Plan: Lasix 40 mg once Continue supplemental O2 with HFNC 13L At risk for re-intubation should he worsen.  Will monitor closely.  Repeat BMP in AM  Joneen Roach, AGACNP-BC [facility] Pulmonology/Critical Care Pager [phone] or [phone]  09/16/2017 2:08 AM

## 2017-09-16 NOTE — Progress Notes (Signed)
Patient ID: Clinton Harrington, male   DOB: 01/29/1951, 66 y.o.   MRN: [id number]  TCTS Evening Rounds:  He has been hemodynamically stable in sinus rhythm.   Has not been able to handle secretions and reintubated today to protect airway and allow pulmonary toilet. Plan for perc trach tomorrow by CCM.  Awake and alert on vent. Otherwise doing well.

## 2017-09-16 NOTE — Progress Notes (Signed)
eLink Physician-Brief Progress Note Patient Name: Clinton Harrington DOB: 17-Nov-1951 MRN: [id number]   Date of Service  09/16/2017  HPI/Events of Note  Hypoxemia - Now on NRBM to keep up sats. Nurse reports thick secretions.   eICU Interventions  Will order: 1. Portable CXR STAT. 2. Ground team to evaluate at bedside.      Intervention Category Major Interventions: Hypoxemia - evaluation and management  Mahkayla Preece [place] 09/16/2017, 1:33 AM

## 2017-09-16 NOTE — Progress Notes (Signed)
PULMONARY / CRITICAL CARE MEDICINE   Name: Clinton Harrington MRN: [id number] DOB: 10-Mar-1951    ADMISSION DATE:  08/23/2017 CONSULTATION DATE:  08/30/2017  REFERRING MD:  Cornelius Moras  CHIEF COMPLAINT:  Acute respiratory failure with hypoxemia  BRIEF:  66 y/o male with CAD who had an MI on 9/15, had severe RV failure, was treated with CABG, IABP, eventual centrimag device placed.  Post operatively was weaned off of the RVAD device on 10/1, remains mechanically ventilated.  Currently being treated for LLL HCAP.  SUBJECTIVE:  Just NTS'd for copious thin secretions. Very weak  VITAL SIGNS: BP 112/64   Pulse (!) 105   Temp 97.7 F (36.5 C) (Oral)   Resp (!) 23   Ht  (1.803 m)   Wt 186 lb 11.7 oz (84.7 kg)   SpO2 98%   BMI 26.04 kg/m   HEMODYNAMICS: CVP:  [4 mmHg-9 mmHg] 4 mmHg  VENTILATOR SETTINGS: FiO2 (%):  [55 %] 55 %  INTAKE / OUTPUT: I/O last 3 completed shifts: In: 3222.7 [I.V.:2072.7; NG/GT:600; IV Piggyback:550] Out: 5200 [Urine:5050; Chest Tube:150]  PHYSICAL EXAMINATION:  General:  Frail, weak male, just NTS'd HEENT: MM pink/moist, no jvd/lan VQQ:VZDG affect Neuro: Intact but weak CV: HSD PULM: Decreased air movement , poor cough mechanics , ct x 2 with out air leak LO:VFIE, non-tender, bsx4 active  Extremities: warm/dry, + edema  Skin: no rashes or lesions   LABS:  BMET  Recent Labs Lab 09/14/17 0400  09/15/17 0330 09/15/17 1641 09/16/17 0305  NA 142  < > 143 148* 146*  K 3.3*  < > 3.7 3.6 3.8  CL 104  < > 105 107 108  CO2 28  --  29  --  30  BUN 63*  < > 44* 40* 39*  CREATININE 1.34*  < > 1.06 0.90 1.18  GLUCOSE 245*  < > 261* 209* 97  < > = values in this interval not displayed.  Electrolytes  Recent Labs Lab 09/10/17 0232  09/12/17 0508  09/14/17 0400 09/15/17 0330 09/15/17 0928 09/16/17 0305  CALCIUM 7.2*  < > 8.1*  < > 8.1* 8.3*  --  8.5*  MG 2.6*  --  2.2  --   --   --  2.2 2.2  PHOS 4.3  --  3.3  --   --   --   --  3.2  <  > = values in this interval not displayed.  CBC  Recent Labs Lab 09/14/17 0400  09/15/17 0330 09/15/17 1641 09/16/17 0305  WBC 8.8  --  7.3  --  10.0  HGB 9.5*  < > 9.2* 9.9* 10.2*  HCT 29.8*  < > 29.6* 29.0* 32.4*  PLT 400  --  378  --  408*  < > = values in this interval not displayed.  Coag's  Recent Labs Lab 09/16/17 0305  INR 1.37   Sepsis Markers No results for input(s): LATICACIDVEN, PROCALCITON, O2SATVEN in the last 168 hours.  ABG  Recent Labs Lab 09/15/17 0354 09/15/17 0518 09/16/17 0015  PHART 7.476* 7.489* 7.536*  PCO2ART 42.8 41.7 36.3  PO2ART 120.0* 73.0* 55.1*   Liver Enzymes  Recent Labs Lab 09/14/17 0400 09/15/17 0330 09/16/17 0305  AST 40 34 39  ALT ALKPHOS 79 83 76  BILITOT 0.5 0.4 0.6  ALBUMIN 2.2* 2.1* 2.4*   Cardiac Enzymes No results for input(s): TROPONINI, PROBNP in the last 168 hours.  Glucose  Recent Labs Lab  09/15/17 1149 09/15/17 1616 09/15/17 1942 09/15/17 2335 09/16/17 0313 09/16/17 0735  GLUCAP 85 58* 90 85 90 127*   Imaging Dg Chest Port 1 View  Result Date: 09/16/2017 CLINICAL DATA:  Shortness of breath. Coronary artery bypass grafting EXAM: PORTABLE CHEST 1 VIEW COMPARISON:  Chest radiograph 09/15/2017 FINDINGS: Right subclavian central venous catheter is unchanged in position with tip in lower SVC. Bilateral chest tubes are in unchanged positions. Unchanged CABG sequelae. Cardiomediastinal contours are unchanged with bibasilar opacities, likely atelectasis. Small left pleural effusion, unchanged. IMPRESSION: Unchanged chest radiograph with stable support apparatus, mild interstitial edema, cardiomegaly and small left pleural effusion. Electronically Signed   By: Deatra Robinson M.D.   On: 09/16/2017 02:17     CULTURES: 9/22 sputum>>>ng 9/24>> Sputum>>ng 9/24 Blood>>NEG  9/24 Urine>>ng 9/28 BC x 2 >coag negative staph 1/4 10/2>> respiratory flora, candida albicans  ANTIBIOTICS: zinacef >>>9/22-  9/24 ceftaz 9/22>>>9/28 Vanc 9/20>> off Zosyn 9/28 >>  SIGNIFICANT EVENTS: 08/23/2017 - admit 9/22 - intubated Cath cabg RV assist device 9/23 - On epi/levo/RVAD/vent.  CI 1.8 despite all interventions 9/24>> Epi, Levo, milrinine,amio,insulin gtt, Lasix gtt, Heparin, Fent, Versed 9/26 - Remains sedated on fent/ versed drips RVAD flows down to 3200 Good UO on lasix gtt @ 15 9/27 - 40% fio2, peep 8. RPM on RVAD turned down. Stil on levophed, milrinone, epi ggtt and heparin.admio gtt. Making urine. Has chest draines. Good urine outoput. Heparin on hold briefly before resumptions due to bleed in groin site. Getting 2 UI PRBC 9/30 mucous plugging requiring multiple lavages 10/1 RV assist device  removed  in the OR 2/2 bleeding 1-0/2 - Stable off RVAD, remains on levo, vaso,Epi, milrinone 10/8 self extubated  STUDIES: 10/6 CT head > NAICP, may need MRI to assess further  Lines/tubes: 10/1 R subclavian CVL >  ETT out 10/8, self extuabtion  DISCUSSION: 66 y/o male s/p MI, RV failure, CABG, centrimag RV support deviced (now weaned off) who remains intubated for respiratory failure.  Currently being treated for HCAP.  10/9 post 24 hr self extubation and may need intubation.  ASSESSMENT / PLAN:  PULMONARY A: Acute respiratory failure with hypoxemia > improving HCAP > infiltrate worse on left Self extubated 10/8 0300 10/9 remains tenuous, requiring NTS for poor cough mechanics  P:   Titrate O2 for sat of 88-92% Pulmonary toilet as tolerated Mobilize as tolerated Note: He is on day 25 of hospitalization, extremely de conditioned, and may need re intubation with subsequent trach and LTAC placement  CARDIOVASCULAR A:  Cardiogenic shock > off pressors CAD, s/p MI and CABG RV failure, off assist devices P:  arrhythmia meds per cardiology TCTS Tele  RENAL A:   AKI>  improving P:   Monitor BMET and UOP Replace electrolytes as needed  GASTROINTESTINAL A:   No acute  issues P:   Continue tube feeding  HEMATOLOGIC A:   No acute issues P:  Monitor for bleeding  INFECTIOUS A:   HCAP, fever on 10/6 Coag neg staph in blood> most likely contaminant P:   Trach aspirate NTD Continue vanc/zosyn Diflucan off  ENDOCRINE CBG (last 3)   Recent Labs  09/15/17 2335 09/16/17 0313 09/16/17 0735  GLUCAP 85 90 127*     A:   DM2, persistent hyperglycemia   P:   Continue SSI Glargine 50 BID > monitor on this dose  NEUROLOGIC A:   10/5 New left sided weakness> resolved  10/6 10/9 weak but follows commands P:   D/C all  sedating medications  FAMILY  - Updates:  Patient updated bedside   App cct 30 min  Brett Canales Minor ACNP Adolph Pollack PCCM Pager [phone] till 3 pm If no answer page [phone]  Attending Note:  66 year old male s/p emergent CABG who self extubated on 10/8 but required multiple NTS and episodes of desaturation overnight.  On exam, very weak cough and ability to protect his airway is questionable at best.  I reviewed CXR myself, pulmonary edema noted.  I had a conversation with the patient, he has no family.  CVTS and myself feel that proceeding with intubation and trach is in the best interest of the patient.  Will proceed with intubation today.  Patient consented for trach.  Continue pressor for vent support and will likely liberate from the vent quickly but airway protection is the concern.  The patient is critically ill with multiple organ systems failure and requires high complexity decision making for assessment and support, frequent evaluation and titration of therapies, application of advanced monitoring technologies and extensive interpretation of multiple databases.   Critical Care Time devoted to patient care services described in this note is  35  Minutes. This time reflects time of care of this signee Dr Koren Bound. This critical care time does not reflect procedure time, or teaching time or supervisory time of PA/NP/Med  student/Med Resident etc but could involve care discussion time.  Alyson Reedy, M.D. [facility] Pulmonary/Critical Care Medicine. Pager: [phone]. After hours pager: [phone].  09/16/2017, 8:21 AM

## 2017-09-16 NOTE — Progress Notes (Signed)
Patient NTS at this time with no complications, obtained copious amounts of clear, thin secretions. Vitals stable.

## 2017-09-16 NOTE — Procedures (Signed)
NGT Placement By MD  Under direct laryngoscopy OGT placed and verified by auscultation.  Xray pending.  Alyson Reedy, M.D. [facility] Pulmonary/Critical Care Medicine. Pager: [phone]. After hours pager: [phone].

## 2017-09-16 NOTE — Plan of Care (Signed)
Problem: Bowel/Gastric: Goal: Gastrointestinal status for postoperative course will improve Outcome: Progressing Pt had one large BM on 10/8. Needs to have speech eval for adequate nutritional intake since OG and TF have since been d/c'd.

## 2017-09-16 NOTE — Progress Notes (Signed)
Nutrition Follow-up  DOCUMENTATION CODES:   Not applicable  INTERVENTION:  If patient is trached and off vent, recommend Vital 1.5 at 77mL/hr, Pro-stat 30mL TID  Regimen provides 2100 calories, 126 grams of protein, free water daily  NUTRITION DIAGNOSIS:   Inadequate oral intake related to inability to eat as evidenced by NPO status. -ongoing  GOAL:   Patient will meet greater than or equal to 90% of their needs -not meeting currently  MONITOR:   Vent status, TF tolerance, Weight trends, Skin, Labs, I & O's  REASON FOR ASSESSMENT:   Ventilator, Other (Comment) (New Tube Feeding)    ASSESSMENT:   66 yo Male with PMH of DM, HTN; found to have severe three-vessel disease; presented for surgical intervention.  S/p CABG x4 and Centrimag RVAD 9/20  Centrimag RVAD removed 10/1 Self-extubated 10/8 Re-intubated 10/9 for airway protection, inability to swallow. Planned trach tomorrow at 1pm  and cortrak to follow - expected to liberate from vent quickly per CCM note Discussed with RN Cortrak removed following self-extubation, has not been replaced, TF is currently off  Estimated needs off vent: 2113-2168 calories (25 cal/kg, MSJ x1.2) 1954-2345 (IBW x25-30 cal/kg) 120-135 grams of protein (1.4-1.6g/kg)  Patient is currently intubated on ventilator support MV: 12.4 L/min Temp (24hrs), Avg:97.8 F (36.6 C), Min:97.4 F (36.3 C), Max:98.4 F (36.9 C) Propofol: None   Intake/Output Summary (Last 24 hours) at 09/16/17 1711 Last data filed at 09/16/17 1600  Gross per 24 hour  Intake           1396.4 ml  Output             1925 ml  Net           -528.6 ml   Labs reviewed:  Na 146 CBGs 144, 148, 127  Medications reviewed and include:  Dulcolax, Colace, Novolog 0-24 Units Every 4 hours, Reglan,  NS at 11mL/hr D5 1/2NS at 26mL/hr --> 122 calories Fentanyl gtt  Diet Order:  Diet NPO time specified Except for: Sips with Meds Diet NPO time specified  Skin:   Wound (see comment) (Stage III to thigh, Stage I to thigh, Stage II and I sacrum)  Last BM:  09/15/2017 (Type 6)  Height:   Ht Readings from Last 1 Encounters:  09/12/17  (1.803 m)    Weight:   Wt Readings from Last 1 Encounters:  09/16/17 186 lb 11.7 oz (84.7 kg)    Ideal Body Weight:  78.1 kg  BMI:  Body mass index is 26.04 kg/m.  Estimated Nutritional Needs:   Kcal:  1936 calories  Protein:  120-135 gm (1.4-1.6g/kg)  Fluid:  per MD  EDUCATION NEEDS:   No education needs identified at this time  Dionne Ano. Letonya Mangels, MS, RD LDN Inpatient Clinical Dietitian Pager [phone]

## 2017-09-16 NOTE — Progress Notes (Signed)
PT Cancellation Note  Patient Details Name: ALP GOLDWATER MRN: [id number] DOB: 1951/04/14   Cancelled Treatment:    Reason Eval/Treat Not Completed: Medical issues which prohibited therapy (pt reintubated today with plans for trach next date. Will hold until after trach placement)   Nathon Stefanski B Sinda Leedom 09/16/2017, 11:35 AM  Delaney Meigs, PT [phone]

## 2017-09-16 NOTE — Progress Notes (Signed)
ANTICOAGULATION CONSULT NOTE - Follow Up Consult  Pharmacy Consult for heparin Indication: atrial fibrillation  Labs:  Recent Labs  09/14/17 0400 09/14/17 1335 09/14/17 1556 09/15/17 0330 09/15/17 1641 09/16/17 0305  HGB 9.5*  --  9.2* 9.2* 9.9* 10.2*  HCT 29.8*  --  27.0* 29.6* 29.0* 32.4*  PLT 400  --   --  378  --  408*  HEPARINUNFRC 0.52 0.51  --  0.43  --  0.77*  CREATININE 1.34*  --  1.10 1.06 0.90  --     Assessment: 66yo male now above goal after several levels at goal; no gtt issues or signs of bleeding per RN, lab drawn from central line and gtt running peripherally.  Goal of Therapy:  Heparin level 0.35-0.5 units/ml   Plan:  Will decrease heparin gtt by 2-3 units/kg/hr to 1300 units/hr and check level in 6hr.  Vernard Gambles, PharmD, BCPS  09/16/2017,3:54 AM

## 2017-09-16 NOTE — Progress Notes (Signed)
SLP Cancellation Note  Patient Details Name: Clinton Harrington MRN: [id number] DOB: [birth year]   Cancelled treatment:         RN and MD present in room stating pt is not currently stable and may need reintubation. Will follow.      Royce Macadamia 09/16/2017, 8:27 AM   Breck Coons Lonell Face.Ed [employer] [phone]

## 2017-09-16 NOTE — Progress Notes (Signed)
Patient ID: Clinton Harrington, male   DOB: [birth year], 66 y.o.   MRN: [id number]        Advanced Heart Failure Rounding Note   Subjective:    Events CABG x4 IABP Centrimag 9/20    IABP removed 9/24.  Centrimag RVAD in place DC-CV successful 9/29  Centrimag removal 10/1 Self-extubated 10/8 Resp distress overnight 09/16/17. Given IV lasix.   Coox 69.4% with CVP 3-4. Off milrinone.   Awake and alert. Voice hoarse. Too weak for swallow study yesterday. Denies SOB.   Negative 1.4L and down 9 lbs. ? Accuracy of weights.   Blood CX NGTD from 09/10/2017   Echo (09/01/17): EF 65-70% with moderate LV hypertrophy, mildly dilated RV with moderately decreased systolic function, IVC dilated.   Objective:   Weight Range: 186 lb 11.7 oz (84.7 kg) Body mass index is 26.04 kg/m.   Vital Signs:   Temp:  [97.4 F (36.3 C)-98.6 F (37 C)] 97.7 F (36.5 C) (10/09 0000) Pulse Rate:  [62-105] 105 (10/09 0732) Resp:  [23-35] 23 (10/09 0732) BP: (109-145)/(63-78) 112/64 (10/09 0700) SpO2:  [88 %-99 %] 98 % (10/09 0735) Arterial Line BP: (134-157)/(53-65) 135/53 (10/08 1600) FiO2 (%):  [55 %] 55 % (10/09 0000) Weight:  [186 lb 11.7 oz (84.7 kg)] 186 lb 11.7 oz (84.7 kg) (10/09 0500) Last BM Date: 09/15/17  Weight change: Filed Weights   09/14/17 0400 09/15/17 0500 09/16/17 0500  Weight: 192 lb 10.9 oz (87.4 kg) 197 lb 5 oz (89.5 kg) 186 lb 11.7 oz (84.7 kg)    Intake/Output:   Intake/Output Summary (Last 24 hours) at 09/16/17 0802 Last data filed at 09/16/17 0700  Gross per 24 hour  Intake          1552.65 ml  Output             2300 ml  Net          -747.35 ml      Physical Exam   General: Fatigued. No resp difficulty. HEENT: Normal. Voice quiet and raspy. Neck: Supple. JVP flat. Carotids 2+ bilat; no bruits. No thyromegaly or nodule noted. Cor: PMI nondisplaced. RRR, No M/G/R noted Lungs: CTAB, normal effort. Abdomen: Soft, non-tender, non-distended, no HSM. No bruits or  masses. +BS  Extremities: No cyanosis, clubbing, rash, R and LLE no edema.  Neuro: Alert to person and place. Cranial nerves grossly intact. moves all 4 extremities w/o difficulty. Affect flat  Telemetry   NSR 80-90s, Personally reviewed.   Labs    CBC  Recent Labs  09/15/17 0330 09/15/17 1641 09/16/17 0305  WBC 7.3  --  10.0  HGB 9.2* 9.9* 10.2*  HCT 29.6* 29.0* 32.4*  MCV 91.9  --  92.8  PLT 378  --  256*   Basic Metabolic Panel  Recent Labs  09/15/17 0330 09/15/17 0928 09/15/17 1641 09/16/17 0305  NA 143  --  148* 146*  K 3.7  --  3.6 3.8  CL 105  --  107 108  CO2 29  --   --  30  GLUCOSE 261*  --  209* 97  BUN 44*  --  40* 39*  CREATININE 1.06  --  0.90 1.18  CALCIUM 8.3*  --   --  8.5*  MG  --  2.2  --  2.2  PHOS  --   --   --  3.2   Liver Function Tests  Recent Labs  09/15/17 0330 09/16/17 0305  AST 34 39  ALT 27 30  ALKPHOS 83 76  BILITOT 0.4 0.6  PROT 7.0 7.4  ALBUMIN 2.1* 2.4*   No results for input(s): LIPASE, AMYLASE in the last 72 hours. Cardiac Enzymes No results for input(s): CKTOTAL, CKMB, CKMBINDEX, TROPONINI in the last 72 hours.  BNP: BNP (last 3 results)  Recent Labs  08/23/17 1927  BNP 200.0*    ProBNP (last 3 results) No results for input(s): PROBNP in the last 8760 hours.   D-Dimer No results for input(s): DDIMER in the last 72 hours. Hemoglobin A1C No results for input(s): HGBA1C in the last 72 hours. Fasting Lipid Panel No results for input(s): CHOL, HDL, LDLCALC, TRIG, CHOLHDL, LDLDIRECT in the last 72 hours. Thyroid Function Tests No results for input(s): TSH, T4TOTAL, T3FREE, THYROIDAB in the last 72 hours.  Invalid input(s): FREET3  Other results:   Imaging    Dg Chest Port 1 View  Result Date: 09/16/2017 CLINICAL DATA:  Shortness of breath. Coronary artery bypass grafting EXAM: PORTABLE CHEST 1 VIEW COMPARISON:  Chest radiograph 09/15/2017 FINDINGS: Right subclavian central venous catheter is  unchanged in position with tip in lower SVC. Bilateral chest tubes are in unchanged positions. Unchanged CABG sequelae. Cardiomediastinal contours are unchanged with bibasilar opacities, likely atelectasis. Small left pleural effusion, unchanged. IMPRESSION: Unchanged chest radiograph with stable support apparatus, mild interstitial edema, cardiomegaly and small left pleural effusion. Electronically Signed   By: Ulyses Jarred M.D.   On: 09/16/2017 02:17     Medications:     Scheduled Medications: . aspirin EC  325 mg Oral Daily   Or  . aspirin  324 mg Per Tube Daily  . atorvastatin  80 mg Per Tube q1800  . bisacodyl  10 mg Oral Daily   Or  . bisacodyl  10 mg Rectal Daily  . brimonidine  1 drop Right Eye BID  . chlorhexidine gluconate (MEDLINE KIT)  15 mL Mouth Rinse BID  . Chlorhexidine Gluconate Cloth  6 each Topical Daily  . docusate  200 mg Oral Daily  . furosemide  20 mg Intravenous BID  . hydrALAZINE  25 mg Oral Q8H   Or  . hydrALAZINE  10 mg Intravenous Q8H  . hydrocortisone sod succinate (SOLU-CORTEF) inj  50 mg Intravenous Daily  . insulin aspart  0-24 Units Subcutaneous Q4H  . insulin glargine  20 Units Subcutaneous BID  . levalbuterol  1.25 mg Nebulization Q6H  . mouth rinse  15 mL Mouth Rinse 10 times per day  . metoCLOPramide (REGLAN) injection  10 mg Intravenous Q6H  . mometasone-formoterol  2 puff Inhalation BID  . mupirocin cream   Topical BID  . pantoprazole (PROTONIX) IV  40 mg Intravenous Q24H  . sodium chloride flush  10-40 mL Intracatheter Q12H  . sodium chloride flush  3 mL Intravenous Q12H  . spironolactone  25 mg Per Tube Daily  . warfarin  2.5 mg Oral Daily  . Warfarin - Physician Dosing Inpatient   Does not apply q1800    Infusions: . sodium chloride    . sodium chloride 20 mL/hr at 09/15/17 0600  . amiodarone 30 mg/hr (09/15/17 2136)  . dextrose 5 % and 0.45% NaCl 30 mL/hr at 09/15/17 2000  . heparin 1,300 Units/hr (09/16/17 0354)  . lactated  ringers Stopped (09/13/17 1702)  . piperacillin-tazobactam (ZOSYN)  IV 3.375 g (09/16/17 0526)  . potassium chloride Stopped (09/15/17 1754)  . potassium chloride      PRN Medications: acetaminophen (TYLENOL) oral liquid 160 mg/5  mL, metoprolol tartrate, ondansetron (ZOFRAN) IV, oxyCODONE, potassium chloride, sodium chloride flush, sodium chloride flush    Patient Profile   Clinton Harrington is a 66 year old with a history of DMII, smoker, CAD S/P PCI RCA 20 years ago, HTN, PAD, and hyperlipidemia admitted with CP and inferior MI with RV infarction.  Clinton Harrington underwent LHC with IABP. After optimized, underwent CABG x4 centrimag for RV infarct on 9/20 and continued IABP.   Assessment/Plan   1. Post-cardiotomy cardiogenic shock: Primarily RV failure from inferior infarct with RV involvement.  Repeat echo on 9/24 showed vigorous LV systolic function with moderate RV dysfunction.  Now s/p CABG and Centrimag for RV support on 9/20. IABP removed 9/24 with good LV function.  Centrimag out 10/1. - Off milrinone. BP improved with hydralazine. - Continue hydralazine 25 mg po OR 10 mg IV TID. Will transition to full po once taking orals or gets tube feed.  - Hold lasix today with low CVP and NPO status.  - Continue spironolactone 25 mg daily.  2. CAD: Inferior MI with RV involvement at presentation, now s/p CABG.   - Clinton Harrington is getting ASA.   - Continue statin.  3. Atrial fibrillation/flutter: DCCV 9/29, now remains in NSR.  - NSR with PACs today. Continue amio 30 mg per hour. Can switch to po when passes swallow. No change.  - Has been on heparin drip. Held this am with bleeding around CT.  Warfarin has been started.  4. AKI - Creatinine stable at 1.18. 5. Acute respiratory failure: Suspect left-sided HCAP, on broad spectrum antibiotics.  - Self-extubated 09/15/17. Had resp distress overnight into 09/16/17, but has not required re-intubation.  - Awaiting swallow study. Consult nutrition. If too weak for swallow may  need TFs. 6. Neuro: ? L-sided weakness now resolved.  Head CT 10/5 negative.  7. ID: Coag negative Staph on 9/28 blood culture, possible HCAP versus contaminant.  - Afebrile. WBC 10.0.  - Blood CX 10/3- NGTD.   - Covering for HCAP with vancomycin/Zosyn => LLL infiltrate on CXR (per CCM).  8. Anemia: Received 2 uPRBCs 09/04/17. 4 units PRBCs on 9/29. Received 2U PRBCs 10/1.   - Hgb 10.2 today. No change.  - Some bleeding around chest tube. Heparin off per Surgery.  10. Sacral decubiti  - WOC following 11. Diabetes: Difficult to control, appreciate help from diabetes coordinator. Will follow recommendations. 12. Out of bed/PT - No change.   Shirley Friar, PA-C  09/16/2017 8:02 AM   Advanced Heart Failure Team Pager [phone] (M-F; 7a - 4p)  Please contact [facility] for night-coverage after hours (4p -7a ) and weekends on [email or web address]  Patient seen with PA, agree with the above note.  Clinton Harrington self-extubated yesterday but is profoundly weak and has had trouble defending his airway.  Given increased secretions, aspiration risk, and inability to defend airway, Clinton Harrington was re-intubated this morning.  Clinton Harrington will need tracheostomy (scheduled for tomorrow) and LTAC. Clinton Harrington is on vancomycin/Zosyn for HCAP.   CVP low at 3-4 but will get dose of Lasix 20 mg IV given respiratory distress and mild edema on CXR.   Now that OG tube in again, can transition amiodarone to per tube.  Clinton Harrington remains in NSR.  Heparin held this morning with bleeding around CT.  Warfarin has been started.   I am going to get a repeat echo to assess RV now that RVAD is out.   Loralie Champagne 09/16/2017 9:55 AM

## 2017-09-16 NOTE — Progress Notes (Signed)
Spoke with Dr. Morton Peters, he feels as I do that the patient is heading in the wrong direction.  Spoke with patient at length bedside.  After a long discussion, he understood that he has been through a lot and that he will need time for recovery prior to being able to go home.  He also understands that he is in evolving respiratory failure and that current condition is not great and puts him at high risk for aspiration pneumonia.  After a long discussion, decision was made to proceed with intubation today.  Trach at 1 PM tomorrow.  Will hold TF for now.  Fentanyl drip and versed pushes ordered.  CXR ordered.  Vent settings placed.  Consent acquired from patient.  Will restart pressors given the inevitable hypotension that will occur with sedation.  Hold off further IVF for now.  Anticipate will liberate quickly from vent.  Total CC time for today 90 minutes.  Alyson Reedy, M.D. [facility] Pulmonary/Critical Care Medicine. Pager: [phone]. After hours pager: [phone].

## 2017-09-16 NOTE — Procedures (Signed)
Intubation Procedure Note Clinton Harrington [id number] 29-Jan-1951  Procedure: Intubation Indications: Airway protection and maintenance  Procedure Details Consent: Risks of procedure as well as the alternatives and risks of each were explained to the (patient/caregiver).  Consent for procedure obtained. Time Out: Verified patient identification, verified procedure, site/side was marked, verified correct patient position, special equipment/implants available, medications/allergies/relevent history reviewed, required imaging and test results available.  Performed  Maximum sterile technique was used including gloves, hand hygiene and mask.  MAC    Evaluation Hemodynamic Status: BP stable throughout; O2 sats: stable throughout Patient's Current Condition: stable Complications: No apparent complications Patient did tolerate procedure well. Chest X-ray ordered to verify placement.  CXR: pending.   Clinton Harrington 09/16/2017

## 2017-09-16 NOTE — Progress Notes (Signed)
8 Days Post-Op Procedure(s) (LRB): REMOVAL OF R-VENTRICULAR ASSIST DEVICE WITH CARDIOPULMONARY BYPASS (N/A) TRANSESOPHAGEAL ECHOCARDIOGRAM (TEE) (N/A) Subjective: 24 hours after patient self extubated he has evidence of increased difficulty with secretions, coarse breath sounds, increasing oxygen demands, decision has been made for reinnervation to protect airway with transition to percutaneous tracheostomy for long-term vent wean needed  Hemodynamically the patient is doing well maintaining sinus rhythm with good mixed venous saturation blood pressure. Mental status is good without neuro deficit Renal function good with every 8 hours IV Lasix  Objective: Vital signs in last 24 hours: Temp:  [97.4 F (36.3 C)-98.4 F (36.9 C)] 98.4 F (36.9 C) (10/09 1534) Pulse Rate:  [80-105] 94 (10/09 1300) Cardiac Rhythm: Normal sinus rhythm (10/09 0800) Resp:  [18-35] 19 (10/09 1300) BP: (108-145)/(63-80) 109/71 (10/09 1300) SpO2:  [88 %-100 %] 100 % (10/09 1300) Arterial Line BP: (135)/(53) 135/53 (10/08 1600) FiO2 (%):  [55 %-100 %] 60 % (10/09 1313) Weight:  [186 lb 11.7 oz (84.7 kg)] 186 lb 11.7 oz (84.7 kg) (10/09 0500)  Hemodynamic parameters for last 24 hours: CVP:  [4 mmHg-9 mmHg] 4 mmHg  Intake/Output from previous day: 10/08 0701 - 10/09 0700 In: 1697.1 [I.V.:1247.1; IV Piggyback:450] Out: 3100 [Urine:2950; Chest Tube:150] Intake/Output this shift: Total I/O In: 113.4 [I.V.:63.4; IV Piggyback:50] Out: 375 [Urine:375]       Exam    General- alert and comfortable   Lungs- clear without rales, wheezes   Cor- regular rate and rhythm, no murmur , gallop   Abdomen- soft, non-tender   Extremities - warm, non-tender, minimal edema   Neuro- oriented, appropriate, no focal weakness   Lab Results:  Recent Labs  09/15/17 0330 09/15/17 1641 09/16/17 0305  WBC 7.3  --  10.0  HGB 9.2* 9.9* 10.2*  HCT 29.6* 29.0* 32.4*  PLT 378  --  408*   BMET:  Recent Labs   09/15/17 0330 09/15/17 1641 09/16/17 0305  NA 143 148* 146*  K 3.7 3.6 3.8  CL 105 107 108  CO2 29  --  30  GLUCOSE 261* 209* 97  BUN 44* 40* 39*  CREATININE 1.06 0.90 1.18  CALCIUM 8.3*  --  8.5*    PT/INR:  Recent Labs  09/16/17 0305  LABPROT 16.8*  INR 1.37   ABG    Component Value Date/Time   PHART 7.536 (H) 09/16/2017 0015   HCO3 30.8 (H) 09/16/2017 0015   TCO2 32 09/15/2017 1641   ACIDBASEDEF 0.5 09/10/2017 0325   O2SAT 69.4 09/16/2017 0308   CBG (last 3)   Recent Labs  09/16/17 0313 09/16/17 0735 09/16/17 1120  GLUCAP 90 127* 148*    Assessment/Plan: S/P Procedure(s) (LRB): REMOVAL OF R-VENTRICULAR ASSIST DEVICE WITH CARDIOPULMONARY BYPASS (N/A) TRANSESOPHAGEAL ECHOCARDIOGRAM (TEE) (N/A) Hold heparin for tracheostomy tomorrow. Indication is atrial fibrillation Transition from endotracheal tube to tracheostomy tomorrow for airway protection and long-term secretion management and ventilator wean Plan placing new core track feeding tube after tracheostomy for nutrition Plan to resume heparin and transition for Coumadin load at least 12 hours after trach  LOS: 24 days    Clinton Harrington 09/16/2017

## 2017-09-17 ENCOUNTER — Inpatient Hospital Stay (HOSPITAL_COMMUNITY): Payer: PPO

## 2017-09-17 ENCOUNTER — Encounter (INDEPENDENT_AMBULATORY_CARE_PROVIDER_SITE_OTHER): Payer: PPO | Admitting: Ophthalmology

## 2017-09-17 ENCOUNTER — Other Ambulatory Visit (HOSPITAL_COMMUNITY): Payer: PPO

## 2017-09-17 DIAGNOSIS — I350 Nonrheumatic aortic (valve) stenosis: Secondary | ICD-10-CM

## 2017-09-17 LAB — ECHOCARDIOGRAM COMPLETE
AOPV: 0.58 m/s
AV Area VTI index: 0.98 cm2/m2
AV Area mean vel: 1.99 cm2
AV Mean grad: 13 mmHg
AV Peak grad: 23 mmHg
AV peak Index: 0.98
AV pk vel: 240 cm/s
AVAREAMEANVIN: 0.98 cm2/m2
AVAREAVTI: 1.99 cm2
AVCELMEANRAT: 0.58
CHL CUP AV VEL: 1.99
CHL CUP DOP CALC LVOT VTI: 20.9 cm
CHL CUP MV DEC (S): 201
CHL CUP TV REG PEAK VELOCITY: 240 cm/s
DOP CAL AO MEAN VELOCITY: 160 cm/s
E decel time: 201 msec
E/e' ratio: 10.23
FS: 22 % — AB (ref 28–44)
HEIGHTINCHES: 71 in
IVS/LV PW RATIO, ED: 1.04
LA vol A4C: 62.8 ml
LADIAMINDEX: 1.47 cm/m2
LASIZE: 30 mm
LDCA: 3.46 cm2
LEFT ATRIUM END SYS DIAM: 30 mm
LV E/e' medial: 10.23
LV SIMPSON'S DISK: 59
LV TDI E'LATERAL: 9.25
LV TDI E'MEDIAL: 5.77
LV dias vol: 127 mL (ref 62–150)
LV e' LATERAL: 9.25 cm/s
LV sys vol index: 26 mL/m2
LVDIAVOLIN: 62 mL/m2
LVEEAVG: 10.23
LVOT SV: 72 mL
LVOT peak grad rest: 8 mmHg
LVOTD: 21 mm
LVOTPV: 138 cm/s
LVOTVTI: 0.57 cm
LVSYSVOL: 52 mL (ref 21–61)
Lateral S' vel: 7.83 cm/s
MV Peak grad: 4 mmHg
MVPKAVEL: 106 m/s
MVPKEVEL: 94.6 m/s
PW: 15.1 mm — AB (ref 0.6–1.1)
Stroke v: 75 ml
TRMAXVEL: 240 cm/s
VTI: 36.4 cm
Valve area index: 0.98
Valve area: 1.99 cm2
WEIGHTICAEL: 2945.35 [oz_av]

## 2017-09-17 LAB — BLOOD GAS, ARTERIAL
Acid-Base Excess: 5.3 mmol/L — ABNORMAL HIGH (ref 0.0–2.0)
Acid-Base Excess: 4.2 mmol/L — ABNORMAL HIGH (ref 0.0–2.0)
Bicarbonate: 28.7 mmol/L — ABNORMAL HIGH (ref 20.0–28.0)
Bicarbonate: 27.8 mmol/L (ref 20.0–28.0)
Drawn by: 418751
Drawn by: 51806
FIO2: 40
FIO2: 100
MECHVT: 600 mL
O2 Saturation: 98.3 %
O2 Saturation: 99.6 %
PCO2 ART: 37.7 mmHg (ref 32.0–48.0)
PEEP: 5 cmH2O
PEEP: 5 cmH2O
Patient temperature: 98.6
Patient temperature: 98.6
RATE: 16 resp/min
RATE: 16 resp/min
VT: 600 mL
pCO2 arterial: 38.2 mmHg (ref 32.0–48.0)
pH, Arterial: 7.493 — ABNORMAL HIGH (ref 7.350–7.450)
pH, Arterial: 7.475 — ABNORMAL HIGH (ref 7.350–7.450)
pO2, Arterial: 116 mmHg — ABNORMAL HIGH (ref 83.0–108.0)
pO2, Arterial: 306 mmHg — ABNORMAL HIGH (ref 83.0–108.0)

## 2017-09-17 LAB — COMPREHENSIVE METABOLIC PANEL
ALT: 31 U/L (ref 17–63)
AST: 44 U/L — ABNORMAL HIGH (ref 15–41)
Albumin: 2.3 g/dL — ABNORMAL LOW (ref 3.5–5.0)
Alkaline Phosphatase: 75 U/L (ref 38–126)
Anion gap: 10 (ref 5–15)
BUN: 60 mg/dL — ABNORMAL HIGH (ref 6–20)
CO2: 29 mmol/L (ref 22–32)
Calcium: 8.2 mg/dL — ABNORMAL LOW (ref 8.9–10.3)
Chloride: 110 mmol/L (ref 101–111)
Creatinine, Ser: 1.81 mg/dL — ABNORMAL HIGH (ref 0.61–1.24)
GFR calc Af Amer: 44 mL/min — ABNORMAL LOW (ref >60)
GFR calc non Af Amer: 38 mL/min — ABNORMAL LOW (ref >60)
Glucose, Bld: 121 mg/dL — ABNORMAL HIGH (ref 65–99)
Potassium: 4.5 mmol/L (ref 3.5–5.1)
Sodium: 149 mmol/L — ABNORMAL HIGH (ref 135–145)
Total Bilirubin: 0.7 mg/dL (ref 0.3–1.2)
Total Protein: 7 g/dL (ref 6.5–8.1)

## 2017-09-17 LAB — PROTIME-INR
INR: 1.64
Prothrombin Time: 19.3 seconds — ABNORMAL HIGH (ref 11.4–15.2)

## 2017-09-17 LAB — CBC
HCT: 29.7 % — ABNORMAL LOW (ref 39.0–52.0)
Hemoglobin: 9 g/dL — ABNORMAL LOW (ref 13.0–17.0)
MCH: 28.7 pg (ref 26.0–34.0)
MCHC: 30.3 g/dL (ref 30.0–36.0)
MCV: 94.6 fL (ref 78.0–100.0)
Platelets: 352 10*3/uL (ref 150–400)
RBC: 3.14 MIL/uL — ABNORMAL LOW (ref 4.22–5.81)
RDW: 15.9 % — ABNORMAL HIGH (ref 11.5–15.5)
WBC: 9.8 10*3/uL (ref 4.0–10.5)

## 2017-09-17 LAB — POCT I-STAT, CHEM 8
BUN: 57 mg/dL — ABNORMAL HIGH (ref 6–20)
CALCIUM ION: 1.08 mmol/L — AB (ref 1.15–1.40)
CHLORIDE: 111 mmol/L (ref 101–111)
CREATININE: 1.8 mg/dL — AB (ref 0.61–1.24)
GLUCOSE: 210 mg/dL — AB (ref 65–99)
HCT: 26 % — ABNORMAL LOW (ref 39.0–52.0)
Hemoglobin: 8.8 g/dL — ABNORMAL LOW (ref 13.0–17.0)
Potassium: 4.7 mmol/L (ref 3.5–5.1)
Sodium: 150 mmol/L — ABNORMAL HIGH (ref 135–145)
TCO2: 21 mmol/L — ABNORMAL LOW (ref 22–32)

## 2017-09-17 LAB — GLUCOSE, CAPILLARY
GLUCOSE-CAPILLARY: 185 mg/dL — AB (ref 65–99)
GLUCOSE-CAPILLARY: 200 mg/dL — AB (ref 65–99)
Glucose-Capillary: 136 mg/dL — ABNORMAL HIGH (ref 65–99)
Glucose-Capillary: 136 mg/dL — ABNORMAL HIGH (ref 65–99)
Glucose-Capillary: 149 mg/dL — ABNORMAL HIGH (ref 65–99)
Glucose-Capillary: 186 mg/dL — ABNORMAL HIGH (ref 65–99)

## 2017-09-17 LAB — CULTURE, RESPIRATORY

## 2017-09-17 LAB — COOXEMETRY PANEL
Carboxyhemoglobin: 1.3 % (ref 0.5–1.5)
Methemoglobin: 1.3 % (ref 0.0–1.5)
O2 Saturation: 79.2 %
Total hemoglobin: 9.2 g/dL — ABNORMAL LOW (ref 12.0–16.0)

## 2017-09-17 LAB — MAGNESIUM
Magnesium: 2.6 mg/dL — ABNORMAL HIGH (ref 1.7–2.4)
Magnesium: 2.7 mg/dL — ABNORMAL HIGH (ref 1.7–2.4)

## 2017-09-17 LAB — PHOSPHORUS
PHOSPHORUS: 4.8 mg/dL — AB (ref 2.5–4.6)
Phosphorus: 4.7 mg/dL — ABNORMAL HIGH (ref 2.5–4.6)

## 2017-09-17 LAB — APTT: aPTT: 31 seconds (ref 24–36)

## 2017-09-17 MED ORDER — HEPARIN (PORCINE) IN NACL 100-0.45 UNIT/ML-% IJ SOLN
1200.0000 [IU]/h | INTRAMUSCULAR | Status: DC
  Administered 2017-09-18: 1200 [IU]/h via INTRAVENOUS
  Filled 2017-09-17: qty 250

## 2017-09-17 MED ORDER — VITAL 1.5 CAL PO LIQD
1000.0000 mL | ORAL | Status: DC
  Administered 2017-09-17 – 2017-09-18 (×2): 1000 mL
  Filled 2017-09-17 (×2): qty 1000

## 2017-09-17 MED ORDER — PERFLUTREN LIPID MICROSPHERE
1.0000 mL | INTRAVENOUS | Status: AC | PRN
  Administered 2017-09-17: 4 mL via INTRAVENOUS
  Filled 2017-09-17: qty 10

## 2017-09-17 MED ORDER — PERFLUTREN LIPID MICROSPHERE
INTRAVENOUS | Status: AC
Start: 2017-09-17 — End: 2017-09-17
  Administered 2017-09-17: 4 mL via INTRAVENOUS
  Filled 2017-09-17: qty 10

## 2017-09-17 MED ORDER — ALBUMIN HUMAN 25 % IV SOLN
25.0000 g | Freq: Once | INTRAVENOUS | Status: AC
  Administered 2017-09-17: 25 g via INTRAVENOUS
  Filled 2017-09-17: qty 50

## 2017-09-17 MED ORDER — ALBUMIN HUMAN 25 % IV SOLN
12.5000 g | Freq: Four times a day (QID) | INTRAVENOUS | Status: AC
  Administered 2017-09-17 – 2017-09-18 (×3): 12.5 g via INTRAVENOUS
  Filled 2017-09-17 (×3): qty 50

## 2017-09-17 MED ORDER — SODIUM CHLORIDE 0.9 % IV BOLUS (SEPSIS)
500.0000 mL | Freq: Once | INTRAVENOUS | Status: AC
  Administered 2017-09-17: 500 mL via INTRAVENOUS

## 2017-09-17 MED ORDER — VITAL HIGH PROTEIN PO LIQD
1000.0000 mL | ORAL | Status: DC
  Filled 2017-09-17: qty 1000

## 2017-09-17 MED ORDER — BUDESONIDE 0.25 MG/2ML IN SUSP
0.2500 mg | Freq: Two times a day (BID) | RESPIRATORY_TRACT | Status: DC
  Administered 2017-09-17 – 2017-09-26 (×19): 0.25 mg via RESPIRATORY_TRACT
  Filled 2017-09-17 (×19): qty 2

## 2017-09-17 MED ORDER — PRO-STAT SUGAR FREE PO LIQD
30.0000 mL | Freq: Two times a day (BID) | ORAL | Status: DC
  Administered 2017-09-17 – 2017-09-18 (×2): 30 mL
  Filled 2017-09-17 (×2): qty 30

## 2017-09-17 MED ORDER — PHENYLEPHRINE HCL 10 MG/ML IJ SOLN
0.0000 ug/min | INTRAMUSCULAR | Status: DC
  Filled 2017-09-17: qty 1

## 2017-09-17 MED ORDER — SPIRONOLACTONE 25 MG PO TABS
12.5000 mg | ORAL_TABLET | Freq: Every day | ORAL | Status: DC
  Administered 2017-09-17 – 2017-09-24 (×7): 12.5 mg
  Filled 2017-09-17 (×8): qty 1

## 2017-09-17 NOTE — Progress Notes (Addendum)
ANTICOAGULATION CONSULT NOTE - Follow-Up Consult  Pharmacy Consult for heparin Indication: atrial fibrillation  Allergies  Allergen Reactions  . Aspirin Other (See Comments)    Stomach upset  . Latex Rash  . Oxycodone-Acetaminophen Itching    Patient Measurements: Height:  (180.3 cm) Weight: 184 lb 1.4 oz (83.5 kg) (bedscale) IBW/kg (Calculated) : 75.3 Heparin Dosing Weight: 94.1kg   Vital Signs: Temp: 99.8 F (37.7 C) (10/10 0746) Temp Source: Axillary (10/10 0746) BP: 102/58 (10/10 1000) Pulse Rate: 99 (10/10 1000)  Labs:  Recent Labs  09/14/17 1335  09/15/17 0330  09/16/17 0305 09/16/17 1707 09/17/17 0550  HGB  --   < > 9.2*  < > 10.2* 9.5* 9.0*  HCT  --   < > 29.6*  < > 32.4* 28.0* 29.7*  PLT  --   --  378  --  408*  --  352  APTT  --   --   --   --   --   --  31  LABPROT  --   --   --   --  16.8*  --  19.3*  INR  --   --   --   --  1.37  --  1.64  HEPARINUNFRC 0.51  --  0.43  --  0.77*  --   --   CREATININE  --   < > 1.06  < > 1.18 1.60* 1.81*  < > = values in this interval not displayed.  Estimated Creatinine Clearance: 44.5 mL/min (A) (by C-G formula based on SCr of 1.81 mg/dL (H)).  . sodium chloride    . sodium chloride 20 mL/hr at 09/17/17 0600  . dextrose 5 % and 0.45% NaCl 30 mL/hr at 09/17/17 0600  . fentaNYL infusion INTRAVENOUS 75 mcg/hr (09/17/17 0600)  . lactated ringers Stopped (09/13/17 1702)  . piperacillin-tazobactam (ZOSYN)  IV Stopped (09/17/17 0959)  . potassium chloride Stopped (09/15/17 1754)  . propofol       Assessment: 31 yoM s/p IABP removal and Centrimag now with recurrent AFib s/p DCCV to start on heparin. Pt noted to have bloody chest tube output yesterday along with elevated heparin level. Currently holding heparin with upcoming trach placement today. Coumadin started per MD last night and INR today of 1.6.  Goal of Therapy:  Heparin Level 0.35-0.5 units/ml Monitor platelets by anticoagulation protocol: Yes    Plan:  -Hold heparin for now -Follow-up resuming tomorrow -Warfarin per MD -Daily INR  ADDENDUM: Discussed with Dr. Donata Clay - okay to resume heparin tonight at midnight ~12 hours after trach placed with no bolus. Will continue to target lower goal of 0.35-0.5.  Plan: -Heparin 1200 units/hr with no bolus at midnight -Check 6-hr heparin level -Monitor CBC, S/Sx bleeding closely  Fredonia Highland, PharmD PGY-2 Cardiology Pharmacy Resident Pager: [phone] 09/17/2017

## 2017-09-17 NOTE — Progress Notes (Addendum)
Patient ID: Clinton Harrington, male   DOB: Jan 08, 1951, 66 y.o.   MRN: [id number]        Advanced Heart Failure Rounding Note   Subjective:    Events CABG x4 IABP Centrimag 9/20    IABP removed 9/24.  Centrimag RVAD in place DC-CV successful 9/29  Centrimag removal 10/1 Self-extubated 10/8 Resp distress overnight 09/16/17. Given IV lasix.  Re-intubated with weakness, increased secretions, and poor airway protection am of 09/16/17  Coox 79.2% with CVP 4-5. Off milrinone as of 09/15/17.  Awake and alert on vent. For Trach this afternoon.  Heparin on hold until at least 12 hrs after trach.   Negative 270 cc and weight down 2 more lbs. Creatinine 1.1 -> 1.6 -> 1.8. BUN up to 60.  Blood CX NGTD from 09/10/2017   Echo (09/01/17): EF 65-70% with moderate LV hypertrophy, mildly dilated RV with moderately decreased systolic function, IVC dilated.   Objective:   Weight Range: 184 lb 1.4 oz (83.5 kg) Body mass index is 25.67 kg/m.   Vital Signs:   Temp:  [97.4 F (36.3 C)-99.1 F (37.3 C)] 98.9 F (37.2 C) (10/10 0400) Pulse Rate:  [66-105] 98 (10/10 0700) Resp:  [0-28] 17 (10/10 0700) BP: (77-121)/(52-80) 90/57 (10/10 0700) SpO2:  [91 %-100 %] 98 % (10/10 0700) FiO2 (%):  [40 %-100 %] 40 % (10/10 0531) Weight:  [184 lb 1.4 oz (83.5 kg)] 184 lb 1.4 oz (83.5 kg) (10/10 0500) Last BM Date: 09/17/17  Weight change: Filed Weights   09/15/17 0500 09/16/17 0500 09/17/17 0500  Weight: 197 lb 5 oz (89.5 kg) 186 lb 11.7 oz (84.7 kg) 184 lb 1.4 oz (83.5 kg)    Intake/Output:   Intake/Output Summary (Last 24 hours) at 09/17/17 0726 Last data filed at 09/17/17 0600  Gross per 24 hour  Intake           1220.9 ml  Output             1490 ml  Net           -269.1 ml      Physical Exam   General: Intubated. Awake/Alert HEENT: Normal Neck: Supple. JVP flat. Carotids 2+ bilat; no bruits. No thyromegaly or nodule noted. Cor: PMI nondisplaced. RRR, No M/G/R noted Lungs: +  Mechanical breathing sounds.  Abdomen: Soft, non-tender, non-distended, no HSM. No bruits or masses. +BS  Extremities: No cyanosis, clubbing, rash, R and LLE no edema.  Neuro: Intubated. Awake/Alert  Telemetry   NSR 80-90s, Personally reviewed.   Labs    CBC  Recent Labs  09/16/17 0305 09/16/17 1707 09/17/17 0550  WBC 10.0  --  9.8  HGB 10.2* 9.5* 9.0*  HCT 32.4* 28.0* 29.7*  MCV 92.8  --  94.6  PLT 408*  --  250   Basic Metabolic Panel  Recent Labs  09/16/17 0305 09/16/17 1707 09/17/17 0550  NA 146* 149* 149*  K 3.8 4.2 4.5  CL 108 109 110  CO2 30  --  29  GLUCOSE 97 135* 121*  BUN 39* 44* 60*  CREATININE 1.18 1.60* 1.81*  CALCIUM 8.5*  --  8.2*  MG 2.2  --  2.6*  PHOS 3.2  --  4.8*   Liver Function Tests  Recent Labs  09/16/17 0305 09/17/17 0550  AST 39 44*  ALT 30 31  ALKPHOS 76 75  BILITOT 0.6 0.7  PROT 7.4 7.0  ALBUMIN 2.4* 2.3*   No results for input(s): LIPASE, AMYLASE  in the last 72 hours. Cardiac Enzymes No results for input(s): CKTOTAL, CKMB, CKMBINDEX, TROPONINI in the last 72 hours.  BNP: BNP (last 3 results)  Recent Labs  08/23/17 1927  BNP 200.0*    ProBNP (last 3 results) No results for input(s): PROBNP in the last 8760 hours.   D-Dimer No results for input(s): DDIMER in the last 72 hours. Hemoglobin A1C No results for input(s): HGBA1C in the last 72 hours. Fasting Lipid Panel No results for input(s): CHOL, HDL, LDLCALC, TRIG, CHOLHDL, LDLDIRECT in the last 72 hours. Thyroid Function Tests No results for input(s): TSH, T4TOTAL, T3FREE, THYROIDAB in the last 72 hours.  Invalid input(s): FREET3  Other results:   Imaging    Dg Chest Port 1 View  Result Date: 09/16/2017 CLINICAL DATA:  Status post CABG on August 28, 2017 EXAM: PORTABLE CHEST 1 VIEW COMPARISON:  Chest x-ray of September 16, 2017 at 1:26 a.m. FINDINGS: The lungs are adequately inflated. The interstitial markings remain increased on the left. There is  a small left pleural effusion. There is no pneumothorax. The chest tubes are in stable position. The endotracheal tube tip lies approximately 1.8 cm above the carina. The esophagogastric tube tip lies in the gastric cardia with the proximal port at the level of the GE junction. The right subclavian venous catheter tip projects over the junction of the proximal and middle thirds of the SVC. IMPRESSION: Slight overall improvement in the appearance of the pulmonary interstitium greatest on the right. Persistent mild interstitial prominence on the left. Persistent small left pleural effusion. No pneumothorax. Mild cardiomegaly with mild central pulmonary vascular prominence, decreased since the earlier study today. The support tubes are in reasonable position. Advancement of the esophagogastric tube by 5-10 cm would assure that the proximal port remains below the GE junction. Electronically Signed   By: [name]  Martinique M.D.   On: 09/16/2017 10:15     Medications:     Scheduled Medications: . amiodarone  200 mg Per Tube BID  . aspirin EC  325 mg Oral Daily   Or  . aspirin  324 mg Per Tube Daily  . atorvastatin  80 mg Per Tube q1800  . bisacodyl  10 mg Oral Daily   Or  . bisacodyl  10 mg Rectal Daily  . brimonidine  1 drop Right Eye BID  . chlorhexidine gluconate (MEDLINE KIT)  15 mL Mouth Rinse BID  . Chlorhexidine Gluconate Cloth  6 each Topical Daily  . docusate  200 mg Oral Daily  . etomidate  40 mg Intravenous Once  . fentaNYL (SUBLIMAZE) injection  200 mcg Intravenous Once  . hydrALAZINE  25 mg Oral Q8H   Or  . hydrALAZINE  10 mg Intravenous Q8H  . hydrocortisone sod succinate (SOLU-CORTEF) inj  50 mg Intravenous Daily  . insulin aspart  0-24 Units Subcutaneous Q4H  . levalbuterol  1.25 mg Nebulization Q6H  . mouth rinse  15 mL Mouth Rinse 10 times per day  . metoCLOPramide (REGLAN) injection  10 mg Intravenous Q6H  . midazolam  4 mg Intravenous Once  . mometasone-formoterol  2 puff  Inhalation BID  . mupirocin cream   Topical BID  . pantoprazole (PROTONIX) IV  40 mg Intravenous Q24H  . sodium chloride flush  10-40 mL Intracatheter Q12H  . sodium chloride flush  3 mL Intravenous Q12H  . spironolactone  25 mg Per Tube Daily  . vecuronium  10 mg Intravenous Once  . warfarin  2.5 mg Per  Tube Daily  . Warfarin - Physician Dosing Inpatient   Does not apply q1800    Infusions: . sodium chloride    . sodium chloride 20 mL/hr at 09/17/17 0600  . dextrose 5 % and 0.45% NaCl 30 mL/hr at 09/17/17 0600  . fentaNYL infusion INTRAVENOUS 75 mcg/hr (09/17/17 0600)  . lactated ringers Stopped (09/13/17 1702)  . piperacillin-tazobactam (ZOSYN)  IV 3.375 g (09/17/17 0559)  . potassium chloride Stopped (09/15/17 1754)  . propofol      PRN Medications: acetaminophen (TYLENOL) oral liquid 160 mg/5 mL, fentaNYL, metoprolol tartrate, midazolam, midazolam, ondansetron (ZOFRAN) IV, oxyCODONE, potassium chloride, sodium chloride flush, sodium chloride flush    Patient Profile   Mr Hanway is a 66 year old with a history of DMII, smoker, CAD S/P PCI RCA 20 years ago, HTN, PAD, and hyperlipidemia admitted with CP and inferior MI with RV infarction.  He underwent LHC with IABP. After optimized, underwent CABG x4 centrimag for RV infarct on 9/20 and continued IABP.   Assessment/Plan   1. Post-cardiotomy cardiogenic shock: Primarily RV failure from inferior infarct with RV involvement.  Repeat echo on 9/24 showed vigorous LV systolic function with moderate RV dysfunction.  Now s/p CABG and Centrimag for RV support on 9/20. IABP removed 9/24 with good LV function.  Centrimag out 10/1. - Off milrinone. BP improved with hydralazine. - Pressures soft. Hold hydralazine.  - Lasix on hold with low CVP, NPO status, and soft pressures. CVP 4-5 - Cut spiro back to 12.5 mg daily. - Repeat echo pending to re-assess RV.  2. CAD: Inferior MI with RV involvement at presentation, now s/p CABG.   - He is  getting ASA.   - Continue statin.  3. Atrial fibrillation/flutter: DCCV 9/29, now remains in NSR.  - NSR with PACs today. - Continue amiodarone per tube.  - Heparin held 09/16/17 with bleeding around CT.  4. AKI - Creatinine up to 1.8 this am from 1.1 yesterday.  5. Acute respiratory failure: Suspect left-sided HCAP, on broad spectrum antibiotics.  - Self-extubated 09/15/17. Had resp distress overnight into 09/16/17, remained weak requiring re-intubation.  - For trach this afternoon. Heparin on hold until 12 hours after with bleeding around CT site yesterday.  6. Neuro: ? L-sided weakness now resolved.  Head CT 10/5 negative. No change.  7. ID: Coag negative Staph on 9/28 blood culture, possible HCAP versus contaminant.  - Tmax 99.1. WBC 9.8.   - Blood CX 10/3- NGTD.   - Covering for HCAP with vancomycin/Zosyn => LLL infiltrate on CXR (per CCM).  8. Anemia: Received 2 uPRBCs 09/04/17. 4 units PRBCs on 9/29. Received 2U PRBCs 10/1.   - Hgb 10.2 -> 9.0. Heparin held 09/16/17 with bleeding from CT.  10. Sacral decubiti  - WOC following. No change.  11. Diabetes: Difficult to control, appreciate help from diabetes coordinator. Will follow recommendations. 12. Out of bed/PT - No change. Set back with re-intubation.    Shirley Friar, PA-C  09/17/2017 7:26 AM   Advanced Heart Failure Team Pager [phone] (M-F; 7a - 4p)  Please contact [facility] for night-coverage after hours (4p -7a ) and weekends on [email or web address]  Patient seen and examined with the above-signed Advanced Practice Provider and/or Housestaff. I personally reviewed laboratory data, imaging studies and relevant notes. I independently examined the patient and formulated the important aspects of the plan. I have edited the note to reflect any of my changes or salient points. I have personally discussed the plan  with the patient and/or family.  Failed extubation. For trach today. Volume status ok. Can hold lasix. Co-ox 79%.  Pending repeat echo. D/w CCM team. Warfarin started. Had some CT bleeding. Heparin on hold. Cut ASA to 81 when INR >= 2.0. Continue spiro.   Glori Bickers, MD  8:35 AM

## 2017-09-17 NOTE — Progress Notes (Signed)
PT Cancellation Note  Patient Details Name: DEANGLEO PASSAGE MRN: [id number] DOB: 1951/01/07   Cancelled Treatment:    Reason Eval/Treat Not Completed: Patient at procedure or test/unavailable (Trach being placed.  HOLD until tomorrow per nurse. )   Amadeo Garnet Merari Pion 09/17/2017, 11:53 AM  [facility] [phone] (pager)

## 2017-09-17 NOTE — Progress Notes (Signed)
SLP Cancellation Note  Patient Details Name: Clinton Harrington DOB: September 23, 1951   Cancelled treatment:        Pt on ventilator. Will sign off. Please reconsult if/when pt ready.    Royce Macadamia 09/17/2017, 7:47 AM   Breck Coons Lonell Face.Ed [employer] [phone]

## 2017-09-17 NOTE — Progress Notes (Signed)
  Echocardiogram 2D Echocardiogram with definity has been performed.  Leta Jungling M 09/17/2017, 11:05 AM

## 2017-09-17 NOTE — Progress Notes (Addendum)
Cortrak Tube Team Note:  Consult received to place a Cortrak feeding tube.   A 10 F Cortrak tube was placed in the L nare and secured with a nasal bridle at 92 cm. Per the Cortrak monitor reading the tube tip is post pyloric.   x-ray is required and ordered. Discussed with RN.   If the tube becomes dislodged please keep the tube and contact the Cortrak team at [email or web address] ([email or web address]) for replacement.  If after hours and replacement cannot be delayed, place a NG tube and confirm placement with an abdominal x-ray.    Kendell Bane RD, LDN, CNSC [phone] Pager [phone] After Hours Pager

## 2017-09-17 NOTE — Progress Notes (Signed)
Fentanyl 200 mcg (2 vials) pulled from pyxis per order Versed 4 mg (2 vials)  pulled from pyxis per order  Only 100 mcg of fentanyl used during procedure Only 2 mg of Versed used during procedure   Fentanyl 100 mcg (1 vial) unable to return in pyxis. Vial unopened Versed 2 mg (1 vial) unable to return in pyxis. Vial unopened  Given to [employer] to return in main pharmacy, Lawyer also present at conversation and return of vials.

## 2017-09-17 NOTE — Progress Notes (Signed)
PULMONARY / CRITICAL CARE MEDICINE   Name: Clinton Harrington MRN: [id number] DOB: 1951-11-27    ADMISSION DATE:  08/23/2017 CONSULTATION DATE:  08/30/2017  REFERRING MD:  Cornelius Moras  CHIEF COMPLAINT:  Acute respiratory failure with hypoxemia  BRIEF:  66 y/o male with CAD who had an MI on 9/15, had severe RV failure, was treated with CABG, IABP, eventual centrimag device placed.  Post operatively was weaned off of the RVAD device on 10/1, remains mechanically ventilated.  Currently being treated for LLL HCAP. Completed 12 abx. For trache 10/10  SUBJECTIVE:  Tubed and for trache today  VITAL SIGNS: BP (!) 90/57   Pulse 98   Temp 98.9 F (37.2 C) (Oral)   Resp 17   Ht  (1.803 m)   Wt 184 lb 1.4 oz (83.5 kg) Comment: bedscale  SpO2 98%   BMI 25.67 kg/m   HEMODYNAMICS: CVP:  [4 mmHg-7 mmHg] 5 mmHg  VENTILATOR SETTINGS: Vent Mode: PRVC FiO2 (%):  [40 %-100 %] 40 % Set Rate:  [16 bmp-20 bmp] 16 bmp Vt Set:  [600 mL] 600 mL PEEP:  [5 cmH20] 5 cmH20 Plateau Pressure:  [18 cmH20-22 cmH20] 19 cmH20  INTAKE / OUTPUT: I/O last 3 completed shifts: In: 2393.9 [I.V.:2056.4; IV Piggyback:337.5] Out: 3040 [Urine:2970; Chest Tube:70]  PHYSICAL EXAMINATION: General:  WNWD male, now intubated HEENT: MM pink/moist, no jvd/lan JXB:JYNWGNF nl Neuro: intact and follows commands CV: s1s2 rrr, no m/r/gsr PULM: even/non-labored, lungs bilaterally diminished , ct's x 2, no air leak, sternal wound well approimated AO:ZHYQ, non-tender, bsx4 active  Extremities: warm/dry, +edema  Skin: no rashes or lesions    LABS:  BMET  Recent Labs Lab 09/15/17 0330  09/16/17 0305 09/16/17 1707 09/17/17 0550  NA 143  < > 146* 149* 149*  K 3.7  < > 3.8 4.2 4.5  CL 105  < > 108 109 110  CO2 29  --  30  --  29  BUN 44*  < > 39* 44* 60*  CREATININE 1.06  < > 1.18 1.60* 1.81*  GLUCOSE 261*  < > 97 135* 121*  < > = values in this interval not displayed.  Electrolytes  Recent Labs Lab  09/12/17 0508  09/15/17 0330 09/15/17 0928 09/16/17 0305 09/17/17 0550  CALCIUM 8.1*  < > 8.3*  --  8.5* 8.2*  MG 2.2  --   --  2.2 2.2 2.6*  PHOS 3.3  --   --   --  3.2 4.8*  < > = values in this interval not displayed.  CBC  Recent Labs Lab 09/15/17 0330  09/16/17 0305 09/16/17 1707 09/17/17 0550  WBC 7.3  --  10.0  --  9.8  HGB 9.2*  < > 10.2* 9.5* 9.0*  HCT 29.6*  < > 32.4* 28.0* 29.7*  PLT 378  --  408*  --  352  < > = values in this interval not displayed.  Coag's  Recent Labs Lab 09/16/17 0305 09/17/17 0550  APTT  --  31  INR 1.37 1.64   Sepsis Markers No results for input(s): LATICACIDVEN, PROCALCITON, O2SATVEN in the last 168 hours.  ABG  Recent Labs Lab 09/15/17 0518 09/16/17 0015 09/17/17 0245  PHART 7.489* 7.536* 7.493*  PCO2ART 41.7 36.3 37.7  PO2ART 73.0* 55.1* 116*   Liver Enzymes  Recent Labs Lab 09/15/17 0330 09/16/17 0305 09/17/17 0550  AST 34 39 44*  ALT ALKPHOS 83 76 75  BILITOT 0.4 0.6  0.7  ALBUMIN 2.1* 2.4* 2.3*   Cardiac Enzymes No results for input(s): TROPONINI, PROBNP in the last 168 hours.  Glucose  Recent Labs Lab 09/16/17 0735 09/16/17 1120 09/16/17 1531 09/16/17 1944 09/16/17 2340 09/17/17 0341  GLUCAP 127* 148* 144* 106* 121* 136*   Imaging Dg Chest Port 1 View  Result Date: 09/16/2017 CLINICAL DATA:  Status post CABG on August 28, 2017 EXAM: PORTABLE CHEST 1 VIEW COMPARISON:  Chest x-ray of September 16, 2017 at 1:26 a.m. FINDINGS: The lungs are adequately inflated. The interstitial markings remain increased on the left. There is a small left pleural effusion. There is no pneumothorax. The chest tubes are in stable position. The endotracheal tube tip lies approximately 1.8 cm above the carina. The esophagogastric tube tip lies in the gastric cardia with the proximal port at the level of the GE junction. The right subclavian venous catheter tip projects over the junction of the proximal and middle  thirds of the SVC. IMPRESSION: Slight overall improvement in the appearance of the pulmonary interstitium greatest on the right. Persistent mild interstitial prominence on the left. Persistent small left pleural effusion. No pneumothorax. Mild cardiomegaly with mild central pulmonary vascular prominence, decreased since the earlier study today. The support tubes are in reasonable position. Advancement of the esophagogastric tube by 5-10 cm would assure that the proximal port remains below the GE junction. Electronically Signed   By: [name]  [place] M.D.   On: 09/16/2017 10:15     CULTURES: 9/22 sputum>>>ng 9/24>> Sputum>>ng 9/24 Blood>>NEG  9/24 Urine>>ng 9/28 BC x 2 >coag negative staph 1/4 10/2>> respiratory flora, candida albicans  ANTIBIOTICS: zinacef >>>9/22- 9/24 ceftaz 9/22>>>9/28 Vanc 9/20>> off Zosyn 9/28 >>  SIGNIFICANT EVENTS: 08/23/2017 - admit 9/22 - intubated Cath cabg RV assist device 9/23 - On epi/levo/RVAD/vent.  CI 1.8 despite all interventions 9/24>> Epi, Levo, milrinine,amio,insulin gtt, Lasix gtt, Heparin, Fent, Versed 9/26 - Remains sedated on fent/ versed drips RVAD flows down to 3200 Good UO on lasix gtt @ 15 9/27 - 40% fio2, peep 8. RPM on RVAD turned down. Stil on levophed, milrinone, epi ggtt and heparin.admio gtt. Making urine. Has chest draines. Good urine outoput. Heparin on hold briefly before resumptions due to bleed in groin site. Getting 2 UI PRBC 9/30 mucous plugging requiring multiple lavages 10/1 RV assist device  removed  in the OR 2/2 bleeding 1-0/2 - Stable off RVAD, remains on levo, vaso,Epi, milrinone 10/8 self extubated 10/9 intubated 10/10 trach  STUDIES: 10/6 CT head > NAICP, may need MRI to assess further  Lines/tubes: 10/1 R subclavian CVL >  ETT out 10/8, self extubation 10/9 ETT>>  DISCUSSION: 66 y/o male s/p MI, RV failure, CABG, centrimag RV support deviced (now weaned off) who remains intubated for respiratory failure.   Currently being treated for HCAP.  10/9 post 24 hr self extubation and may need intubation. Re intubated 10/9 and will be trached 10/10.  ASSESSMENT / PLAN:  PULMONARY A: Acute respiratory failure with hypoxemia > improving HCAP > infiltrate worse on left Self extubated 10/8 0300 10/9 remains tenuous, requiring NTS for poor cough mechanics  10/9 re intubated for safety Plan for trache 10/10 P:   Titrate O2 for sat of 88-92% Pulmonary toilet as tolerated Mobilize as tolerated Note: He is on day 26 of hospitalization, extremely de conditioned, and was reintubated 10/9. He will need LTACH once trache stable 10/10 plan for bedside trach  CARDIOVASCULAR A:  Cardiogenic shock > off pressors CAD, s/p MI and  CABG RV failure, off assist devices PAF P:  arrhythmia meds per cardiology TCTS Tele Heparin off for trache  RENAL Lab Results  Component Value Date   CREATININE 1.81 (H) 09/17/2017   CREATININE 1.60 (H) 09/16/2017   CREATININE 1.18 09/16/2017    Recent Labs Lab 09/16/17 0305 09/16/17 1707 09/17/17 0550  K 3.8 4.2 4.5     A:   AKI>  stable P:   Monitor BMET and UOP Replace electrolytes as needed  GASTROINTESTINAL A:   No acute issues P:   Continue tube feeding  HEMATOLOGIC  Recent Labs  09/16/17 1707 09/17/17 0550  HGB 9.5* 9.0*    A:   No acute issues P:  Monitor for bleeding Anticoagulation on hold for trache 10/10  INFECTIOUS A:   HCAP, fever on 10/6 Coag neg staph in blood> most likely contaminant P:   Trach aspirate NTD /zosyn day 12, consider dc 10/10 Diflucan off  ENDOCRINE CBG (last 3)   Recent Labs  09/16/17 1944 09/16/17 2340 09/17/17 0341  GLUCAP 106* 121* 136*     A:   DM2, persistent hyperglycemia   P:   Continue SSI Glargine 50 BID  Monitor closely off tube feeds  NEUROLOGIC A:   10/5 New left sided weakness> resolved  10/6 10/9 weak but follows commands 10/9 reintubation therefore back on sedation  till trache and weaning resumes. P:   Awake and alert despite sedation  FAMILY  - Updates:  Patient updated bedside   App cct 30 min  Brett Canales Minor ACNP Adolph Pollack PCCM Pager [phone] till 3 pm If no answer page [phone]  Attending Note:  66 year old male s/p emergent CABG and multiple cardiac interventions.  Self extubated 2 days ago then reintubated due to weak cough.  On exam, weaning but airway protection is the concern.  I reviewed CXR myself, ETT is in good position.  Will proceed with tracheostomy today.  Replace electrolytes.  Continue abx for a total of 14 days.  Restart TF post trach.  Hold off weaning for today.  Hope is to liberate from the ventilator in AM but keep trach in place until strong enough to clear airway.  PEG may be a consideration pending gaining strength in the next few days.  The patient is critically ill with multiple organ systems failure and requires high complexity decision making for assessment and support, frequent evaluation and titration of therapies, application of advanced monitoring technologies and extensive interpretation of multiple databases.   Critical Care Time devoted to patient care services described in this note is  35  Minutes. This time reflects time of care of this signee Dr Koren Bound. This critical care time does not reflect procedure time, or teaching time or supervisory time of PA/NP/Med student/Med Resident etc but could involve care discussion time.  Alyson Reedy, M.D. [facility] Pulmonary/Critical Care Medicine. Pager: [phone]. After hours pager: [phone].

## 2017-09-17 NOTE — Procedures (Signed)
Bronchoscopy Procedure Note Clinton Harrington [id number] 09-16-1951  Procedure: Bronchoscopy Indications: Diagnostic evaluation of the airways  Procedure Details Consent: Risks of procedure as well as the alternatives and risks of each were explained to the (patient/caregiver).  Consent for procedure obtained. Time Out: Verified patient identification, verified procedure, site/side was marked, verified correct patient position, special equipment/implants available, medications/allergies/relevent history reviewed, required imaging and test results available.  Performed  In preparation for procedure, patient was given 100% FiO2 and bronchoscope lubricated. Sedation: continous sedation  Airway entered and the following bronchi were examined: Bronchi.   Procedures performed: Brushings performed n0 Bronchoscope removed.  , Patient placed back on 100% FiO2 at conclusion of procedure.    Evaluation Hemodynamic Status: BP stable throughout; O2 sats: stable throughout Patient's Current Condition: stable Specimens:  None Complications: No apparent complications Patient did tolerate procedure well.   Nelda Bucks. 09/17/2017   I introduced bronch Back up out ett under direct visualization to 17 cm with short neck , needed as much room as possible for placement high Observed placement needle, wire, punch dil and rhino then trach 6  No posterior wall injury Bleeding none in airway Placed bronch through new trach, wnl, placed well  Mcarthur Rossetti. Tyson Alias, MD, FACP Pgr: [phone] [place] [facility]

## 2017-09-17 NOTE — Procedures (Addendum)
Percutaneous Tracheostomy Placement  Consent from family.  Patient sedated, paralyzed and position.  Placed on 100% FiO2 and RR matched.  Area cleaned and draped.  Lidocaine/epi injected.  Skin incision done followed by blunt dissection.  Trachea palpated then punctured, catheter passed and visualized bronchoscopically.  Wire placed and visualized.  Catheter removed.  Airway then entered and dilated.  Size 6 cuffed shiley trach placed and visualized bronchoscopically well above carina.  Good volume returns.  Patient tolerated the procedure well without complications.  Minimal blood loss.  CXR ordered and pending.  Dr. Tyson Alias as first assist  Alyson Reedy, M.D. [facility] Pulmonary/Critical Care Medicine. Pager: [phone]. After hours pager: [phone].

## 2017-09-17 NOTE — Procedures (Signed)
Bedside Tracheostomy Insertion Procedure Note   Patient Details:   Name: Clinton Harrington DOB: 30-May-1951 MRN: [id number]  Procedure: Tracheostomy  Pre Procedure Assessment: ET Tube Size:8.0 ET Tube secured at lip (cm):24 Bite block in place: No Breath Sounds: Rhonch  Post Procedure Assessment: BP (!) 83/54   Pulse 90   Temp 100 F (37.8 C) (Oral)   Resp 16   Ht  (1.803 m)   Wt 184 lb 1.4 oz (83.5 kg) Comment: bedscale  SpO2 100%   BMI 25.67 kg/m  O2 sats: stable throughout Complications: No apparent complications Patient did tolerate procedure well Tracheostomy Brand:Shiley Tracheostomy Style:Cuffed Tracheostomy Size: 6.0 Tracheostomy Secured VOZ:[id number] Tracheostomy Placement Confirmation:Trach cuff visualized and in place    [name] 09/17/2017, 12:14 PM

## 2017-09-17 NOTE — Progress Notes (Signed)
Patient ID: Clinton Harrington, male   DOB: 05/03/1951, 66 y.o.   MRN: [id number] EVENING ROUNDS NOTE :     [address]       [place] [address]             [phone]                 9 Days Post-Op Procedure(s) (LRB): REMOVAL OF R-VENTRICULAR ASSIST DEVICE WITH CARDIOPULMONARY BYPASS (N/A) TRANSESOPHAGEAL ECHOCARDIOGRAM (TEE) (N/A)  Total Length of Stay:  LOS: 25 days  BP (!) 110/57   Pulse 96   Temp 99.8 F (37.7 C) (Oral)   Resp (!) 32   Ht  (1.803 m)   Wt 184 lb 1.4 oz (83.5 kg) Comment: bedscale  SpO2 98%   BMI 25.67 kg/m   .Intake/Output      10/09 0701 - 10/10 0700 10/10 0701 - 10/11 0700   P.O. 0    I.V. (mL/kg) 1120.9 (13.4) 455.5 (5.5)   NG/GT 0 178   IV Piggyback 187.5 600   Total Intake(mL/kg) 1308.4 (15.7) 1233.5 (14.8)   Urine (mL/kg/hr) 1420 (0.7) 900 (0.9)   Chest Tube 70 0   Total Output 1490 900   Net -181.6 +333.5          . sodium chloride    . sodium chloride 20 mL/hr at 09/17/17 0600  . albumin human    . dextrose 5 % and 0.45% NaCl 30 mL/hr at 09/17/17 0600  . feeding supplement (VITAL 1.5 CAL) 1,000 mL (09/17/17 1748)  . fentaNYL infusion INTRAVENOUS 75 mcg/hr (09/17/17 1630)  . [START ON 09/18/2017] heparin    . lactated ringers Stopped (09/13/17 1702)  . phenylephrine (NEO-SYNEPHRINE) Adult infusion    . piperacillin-tazobactam (ZOSYN)  IV Stopped (09/17/17 1743)  . potassium chloride Stopped (09/15/17 1754)  . propofol       Lab Results  Component Value Date   WBC 9.8 09/17/2017   HGB 8.8 (L) 09/17/2017   HCT 26.0 (L) 09/17/2017   PLT 352 09/17/2017   GLUCOSE 210 (H) 09/17/2017   CHOL 126 08/24/2017   TRIG 240 (H) 08/24/2017   HDL 31 (L) 08/24/2017   LDLDIRECT 128.6 10/30/2012   LDLCALC 47 08/24/2017   ALT 31 09/17/2017   AST 44 (H) 09/17/2017   NA 150 (H) 09/17/2017   K 4.7 09/17/2017   CL 111 09/17/2017   CREATININE 1.80 (H) 09/17/2017   BUN 57 (H) 09/17/2017   CO2 29 09/17/2017   TSH 0.722  08/25/2017   INR 1.64 09/17/2017   HGBA1C 5.9 (H) 08/25/2017   MICROALBUR 30.9 (H) 10/30/2012   Now trach done , remains on vent   [name]  Beeper [phone] Office [phone] 09/17/2017 6:31 PM

## 2017-09-17 NOTE — Progress Notes (Signed)
Dr. Donata Clay came by to see pt [id number]. Made aware no output from pleural chest tube today. Dr. Donata Clay wants chest tube to be pulled before heparin restarted tonight. Verbal order placed.

## 2017-09-17 NOTE — Progress Notes (Signed)
9 Days Post-Op Procedure(s) (LRB): REMOVAL OF R-VENTRICULAR ASSIST DEVICE WITH CARDIOPULMONARY BYPASS (N/A) TRANSESOPHAGEAL ECHOCARDIOGRAM (TEE) (N/A) Subjective:  patient had successful percutaneous tracheostomy today for airway protection and to optimize ventilator wean  post trach chest x-ray clear  patient's BUN-creatinine increased over the past 36 hours-Lasix currently on hold and we'll liberalize fluids  feeding tube needs to be replaced while patient on ventilator with trach-core track ordered  remains in sinus rhythm with history of atrial fibrillation.INR slowly increasing with Coumadin dosingIV heparin bridge will be resumed after tracheostomy tonight  Objective: Vital signs in last 24 hours: Temp:  [98.4 F (36.9 C)-100 F (37.8 C)] 100 F (37.8 C) (10/10 1100) Pulse Rate:  [66-101] 92 (10/10 1400) Cardiac Rhythm: Normal sinus rhythm (10/10 1200) Resp:  [16-34] 21 (10/10 1400) BP: (54-121)/(27-68) 106/61 (10/10 1400) SpO2:  [95 %-100 %] 99 % (10/10 1400) FiO2 (%):  [40 %-100 %] 100 % (10/10 1136) Weight:  [184 lb 1.4 oz (83.5 kg)] 184 lb 1.4 oz (83.5 kg) (10/10 0500)  Hemodynamic parameters for last 24 hours: CVP:  [5 mmHg-7 mmHg] 7 mmHg  Intake/Output from previous day: 10/09 0701 - 10/10 0700 In: 1308.4 [I.V.:1120.9; IV Piggyback:187.5] Out: 1490 [Urine:1420; Chest Tube:70] Intake/Output this shift: Total I/O In: 810.5 [I.V.:210.5; NG/GT:100; IV Piggyback:500] Out: 400 [Urine:400]       Exam    General- alert and comfortable   Lungs- clear without rales, wheezes   Cor- regular rate and rhythm, no murmur , gallop   Abdomen- soft, non-tender   Extremities - warm, non-tender, minimal edema   Neuro- oriented, appropriate, no focal weakness   Lab Results:  Recent Labs  09/16/17 0305 09/16/17 1707 09/17/17 0550  WBC 10.0  --  9.8  HGB 10.2* 9.5* 9.0*  HCT 32.4* 28.0* 29.7*  PLT 408*  --  352   BMET:  Recent Labs  09/16/17 0305 09/16/17 1707  09/17/17 0550  NA 146* 149* 149*  K 3.8 4.2 4.5  CL 108 109 110  CO2 30  --  29  GLUCOSE 97 135* 121*  BUN 39* 44* 60*  CREATININE 1.18 1.60* 1.81*  CALCIUM 8.5*  --  8.2*    PT/INR:  Recent Labs  09/17/17 0550  LABPROT 19.3*  INR 1.64   ABG    Component Value Date/Time   PHART 7.493 (H) 09/17/2017 0245   HCO3 28.7 (H) 09/17/2017 0245   TCO2 29 09/16/2017 1707   ACIDBASEDEF 0.5 09/10/2017 0325   O2SAT 79.2 09/17/2017 0550   CBG (last 3)   Recent Labs  09/17/17 0341 09/17/17 0754 09/17/17 1127  GLUCAP 136* 136* 149*    Assessment/Plan: S/P Procedure(s) (LRB): REMOVAL OF R-VENTRICULAR ASSIST DEVICE WITH CARDIOPULMONARY BYPASS (N/A) TRANSESOPHAGEAL ECHOCARDIOGRAM (TEE) (N/A)  tracheostomy for postop acute respiratory insufficiency and airway protectionwith aspiration pneumonia  recent increase in BUN creatinine-Lasix on hold  postop protein depletion malnutrition, moderate will replace core track  continue Coumadin, stop IV heparin when INR2.0   LOS: 25 days    Clinton Harrington 09/17/2017

## 2017-09-18 ENCOUNTER — Inpatient Hospital Stay (HOSPITAL_COMMUNITY): Payer: PPO

## 2017-09-18 LAB — POCT I-STAT, CHEM 8
BUN: 48 mg/dL — AB (ref 6–20)
CALCIUM ION: 1.12 mmol/L — AB (ref 1.15–1.40)
Chloride: 116 mmol/L — ABNORMAL HIGH (ref 101–111)
Creatinine, Ser: 1.4 mg/dL — ABNORMAL HIGH (ref 0.61–1.24)
Glucose, Bld: 160 mg/dL — ABNORMAL HIGH (ref 65–99)
HCT: 26 % — ABNORMAL LOW (ref 39.0–52.0)
HEMOGLOBIN: 8.8 g/dL — AB (ref 13.0–17.0)
Potassium: 3.7 mmol/L (ref 3.5–5.1)
SODIUM: 157 mmol/L — AB (ref 135–145)
TCO2: 28 mmol/L (ref 22–32)

## 2017-09-18 LAB — URINALYSIS, MICROSCOPIC (REFLEX)

## 2017-09-18 LAB — COOXEMETRY PANEL
Carboxyhemoglobin: 1.3 % (ref 0.5–1.5)
Methemoglobin: 1.3 % (ref 0.0–1.5)
O2 Saturation: 79.7 %
Total hemoglobin: 8.1 g/dL — ABNORMAL LOW (ref 12.0–16.0)

## 2017-09-18 LAB — CBC
HCT: 26.2 % — ABNORMAL LOW (ref 39.0–52.0)
Hemoglobin: 7.8 g/dL — ABNORMAL LOW (ref 13.0–17.0)
MCH: 28.7 pg (ref 26.0–34.0)
MCHC: 29.8 g/dL — ABNORMAL LOW (ref 30.0–36.0)
MCV: 96.3 fL (ref 78.0–100.0)
Platelets: 278 10*3/uL (ref 150–400)
RBC: 2.72 MIL/uL — ABNORMAL LOW (ref 4.22–5.81)
RDW: 16.5 % — ABNORMAL HIGH (ref 11.5–15.5)
WBC: 7.7 10*3/uL (ref 4.0–10.5)

## 2017-09-18 LAB — COMPREHENSIVE METABOLIC PANEL
ALT: 27 U/L (ref 17–63)
AST: 40 U/L (ref 15–41)
Albumin: 2.7 g/dL — ABNORMAL LOW (ref 3.5–5.0)
Alkaline Phosphatase: 68 U/L (ref 38–126)
Anion gap: 6 (ref 5–15)
BUN: 61 mg/dL — ABNORMAL HIGH (ref 6–20)
CO2: 28 mmol/L (ref 22–32)
Calcium: 7.9 mg/dL — ABNORMAL LOW (ref 8.9–10.3)
Chloride: 114 mmol/L — ABNORMAL HIGH (ref 101–111)
Creatinine, Ser: 1.75 mg/dL — ABNORMAL HIGH (ref 0.61–1.24)
GFR calc Af Amer: 46 mL/min — ABNORMAL LOW (ref >60)
GFR calc non Af Amer: 40 mL/min — ABNORMAL LOW (ref >60)
Glucose, Bld: 230 mg/dL — ABNORMAL HIGH (ref 65–99)
Potassium: 4 mmol/L (ref 3.5–5.1)
Sodium: 148 mmol/L — ABNORMAL HIGH (ref 135–145)
Total Bilirubin: 0.8 mg/dL (ref 0.3–1.2)
Total Protein: 6.6 g/dL (ref 6.5–8.1)

## 2017-09-18 LAB — URINALYSIS, ROUTINE W REFLEX MICROSCOPIC
BILIRUBIN URINE: NEGATIVE
GLUCOSE, UA: NEGATIVE mg/dL
KETONES UR: NEGATIVE mg/dL
Nitrite: NEGATIVE
PROTEIN: NEGATIVE mg/dL
Specific Gravity, Urine: 1.01 (ref 1.005–1.030)
pH: 6 (ref 5.0–8.0)

## 2017-09-18 LAB — PROTIME-INR
INR: 2.71
INR: 2.71
Prothrombin Time: 28.6 seconds — ABNORMAL HIGH (ref 11.4–15.2)
Prothrombin Time: 28.5 seconds — ABNORMAL HIGH (ref 11.4–15.2)

## 2017-09-18 LAB — GLUCOSE, CAPILLARY
GLUCOSE-CAPILLARY: 217 mg/dL — AB (ref 65–99)
GLUCOSE-CAPILLARY: 183 mg/dL — AB (ref 65–99)
Glucose-Capillary: 214 mg/dL — ABNORMAL HIGH (ref 65–99)
Glucose-Capillary: 217 mg/dL — ABNORMAL HIGH (ref 65–99)
Glucose-Capillary: 221 mg/dL — ABNORMAL HIGH (ref 65–99)
Glucose-Capillary: 247 mg/dL — ABNORMAL HIGH (ref 65–99)

## 2017-09-18 LAB — PHOSPHORUS
PHOSPHORUS: 3.1 mg/dL (ref 2.5–4.6)
Phosphorus: 2.6 mg/dL (ref 2.5–4.6)

## 2017-09-18 LAB — MAGNESIUM
MAGNESIUM: 3 mg/dL — AB (ref 1.7–2.4)
Magnesium: 2.8 mg/dL — ABNORMAL HIGH (ref 1.7–2.4)

## 2017-09-18 LAB — HEPARIN LEVEL (UNFRACTIONATED): Heparin Unfractionated: 0.13 IU/mL — ABNORMAL LOW (ref 0.30–0.70)

## 2017-09-18 LAB — PROCALCITONIN: PROCALCITONIN: 0.35 ng/mL

## 2017-09-18 MED ORDER — SODIUM CHLORIDE 0.9 % IV SOLN
510.0000 mg | Freq: Once | INTRAVENOUS | Status: AC
  Administered 2017-09-18: 510 mg via INTRAVENOUS
  Filled 2017-09-18: qty 17

## 2017-09-18 MED ORDER — PANTOPRAZOLE SODIUM 40 MG PO PACK
40.0000 mg | PACK | Freq: Every day | ORAL | Status: DC
  Administered 2017-09-18 – 2017-09-26 (×9): 40 mg
  Filled 2017-09-18 (×9): qty 20

## 2017-09-18 MED ORDER — COLLAGENASE 250 UNIT/GM EX OINT
TOPICAL_OINTMENT | Freq: Every day | CUTANEOUS | Status: DC
  Administered 2017-09-18: 12:00:00 via TOPICAL
  Administered 2017-09-19 – 2017-09-20 (×2): 1 via TOPICAL
  Administered 2017-09-21 – 2017-09-22 (×2): via TOPICAL
  Administered 2017-09-23: 1 via TOPICAL
  Administered 2017-09-24 – 2017-09-26 (×3): via TOPICAL
  Filled 2017-09-18: qty 30

## 2017-09-18 MED ORDER — ASPIRIN EC 81 MG PO TBEC
81.0000 mg | DELAYED_RELEASE_TABLET | Freq: Every day | ORAL | Status: DC
  Filled 2017-09-18: qty 1

## 2017-09-18 MED ORDER — INSULIN GLARGINE 100 UNIT/ML ~~LOC~~ SOLN
10.0000 [IU] | Freq: Two times a day (BID) | SUBCUTANEOUS | Status: DC
  Administered 2017-09-18 – 2017-09-20 (×6): 10 [IU] via SUBCUTANEOUS
  Filled 2017-09-18 (×7): qty 0.1

## 2017-09-18 MED ORDER — INSULIN GLARGINE 100 UNIT/ML ~~LOC~~ SOLN
10.0000 [IU] | Freq: Every day | SUBCUTANEOUS | Status: DC
  Filled 2017-09-18: qty 0.1

## 2017-09-18 MED ORDER — DEXTROSE 5 % IV SOLN
1.0000 g | Freq: Three times a day (TID) | INTRAVENOUS | Status: DC
  Administered 2017-09-18 – 2017-09-22 (×12): 1 g via INTRAVENOUS
  Filled 2017-09-18 (×13): qty 1

## 2017-09-18 MED ORDER — PRO-STAT SUGAR FREE PO LIQD
30.0000 mL | Freq: Three times a day (TID) | ORAL | Status: DC
  Administered 2017-09-18 – 2017-09-26 (×25): 30 mL
  Filled 2017-09-18 (×25): qty 30

## 2017-09-18 MED ORDER — VANCOMYCIN HCL IN DEXTROSE 750-5 MG/150ML-% IV SOLN
750.0000 mg | Freq: Two times a day (BID) | INTRAVENOUS | Status: DC
  Administered 2017-09-18 – 2017-09-21 (×6): 750 mg via INTRAVENOUS
  Filled 2017-09-18 (×7): qty 150

## 2017-09-18 MED ORDER — SODIUM CHLORIDE 0.9 % IV SOLN
100.0000 mg | Freq: Once | INTRAVENOUS | Status: DC
  Filled 2017-09-18: qty 2

## 2017-09-18 MED ORDER — VITAL 1.5 CAL PO LIQD
1000.0000 mL | ORAL | Status: DC
  Administered 2017-09-18 – 2017-09-26 (×8): 1000 mL
  Filled 2017-09-18 (×13): qty 1000

## 2017-09-18 MED ORDER — FLUCONAZOLE 100MG IVPB
100.0000 mg | INTRAVENOUS | Status: AC
  Administered 2017-09-18 – 2017-09-20 (×3): 100 mg via INTRAVENOUS
  Filled 2017-09-18 (×3): qty 50

## 2017-09-18 MED ORDER — ASPIRIN 81 MG PO CHEW
81.0000 mg | CHEWABLE_TABLET | Freq: Every day | ORAL | Status: DC
  Administered 2017-09-18 – 2017-09-21 (×4): 81 mg
  Filled 2017-09-18 (×4): qty 1

## 2017-09-18 NOTE — Progress Notes (Signed)
Late entry-  Pt w/ RR 40's, pt c/o SOB.  Pt placed back on vent- PSV mode initially but pt continued w/ tachypnea and desat to 86%.  Pt placed on full vent support, RN in room and aware.

## 2017-09-18 NOTE — Progress Notes (Signed)
Patient ID: MONICO SUDDUTH, male   DOB: 06-27-1951, 66 y.o.   MRN: [id number]        Advanced Heart Failure Rounding Note   Subjective:    Events CABG x4 IABP Centrimag 9/20    IABP removed 9/24.  Centrimag RVAD in place DC-CV successful 9/29  Centrimag removal 10/1 Self-extubated 10/8 Re-intubated 10/9 S/P Tach 10/10  Yesterday he underwent tach. On 40% trach collar. Off diuretics with elevated creatinine.   ECHO 09/17/2017  Left ventricle: The cavity size was normal. There was moderate   concentric hypertrophy. Systolic function was normal. The   estimated ejection fraction was in the range of 60% to 65%.   Although no diagnostic regional wall motion abnormality was   identified, this possibility cannot be completely excluded on the   basis of this study. Doppler parameters are consistent with   abnormal left ventricular relaxation (grade 1 diastolic   dysfunction). Doppler parameters are consistent with   indeterminate mean left atrial filling pressure. - Ventricular septum: Septal motion showed paradox. - Aortic valve: There was mild stenosis. Valve area (VTI): 1.99   cm^2. Valve area (Vmax): 1.99 cm^2. Valve area (Vmean): 1.99   cm^2. - Right ventricle: Systolic function was moderately reduced. Impressions: - Compared to 09/05/2017, the right ventricle appears less dilated   and there is some improvement in RV function.   Objective:   Weight Range: 179 lb 3.7 oz (81.3 kg) Body mass index is 25 kg/m.   Vital Signs:   Temp:  [99.4 F (37.4 C)-102.5 F (39.2 C)] 101.2 F (38.4 C) (10/11 0546) Pulse Rate:  [51-101] 95 (10/11 0600) Resp:  [16-34] 21 (10/11 0600) BP: (54-121)/(27-68) 112/61 (10/11 0600) SpO2:  [95 %-100 %] 98 % (10/11 0600) FiO2 (%):  [40 %-100 %] 40 % (10/11 0400) Weight:  [179 lb 3.7 oz (81.3 kg)] 179 lb 3.7 oz (81.3 kg) (10/11 0500) Last BM Date: 09/17/17  Weight change: Filed Weights   09/16/17 0500 09/17/17 0500 09/18/17 0500    Weight: 186 lb 11.7 oz (84.7 kg) 184 lb 1.4 oz (83.5 kg) 179 lb 3.7 oz (81.3 kg)    Intake/Output:   Intake/Output Summary (Last 24 hours) at 09/18/17 0700 Last data filed at 09/18/17 0600  Gross per 24 hour  Intake          2417.85 ml  Output             1850 ml  Net           567.85 ml      Physical Exam  CVP 7-8  General:  No resp difficulty. NAD HEENT: normal Neck: Trach . JVP 7-8.  Carotids 2+ bilat; no bruits. No lymphadenopathy or thryomegaly appreciated. Cor: PMI nondisplaced. Regular rate & rhythm. No rubs, gallops or murmurs. R subclavian Lungs: coarse.  Abdomen: soft, nontender, nondistended. No hepatosplenomegaly. No bruits or masses. Good bowel sounds. Extremities: no cyanosis, clubbing, rash, edema. R and LLE SCDs.  Neuro: Lurline Idol. Follows commands.   Telemetry  NSR 90s personally reviewed.   Labs    CBC  Recent Labs  09/17/17 0550 09/17/17 1621 09/18/17 0438  WBC 9.8  --  7.7  HGB 9.0* 8.8* 7.8*  HCT 29.7* 26.0* 26.2*  MCV 94.6  --  96.3  PLT 352  --  299   Basic Metabolic Panel  Recent Labs  09/17/17 0550 09/17/17 1621 09/17/17 1700 09/18/17 0438  NA 149* 150*  --  148*  K 4.5 4.7  --  4.0  CL 110 111  --  114*  CO2 29  --   --  28  GLUCOSE 121* 210*  --  230*  BUN 60* 57*  --  61*  CREATININE 1.81* 1.80*  --  1.75*  CALCIUM 8.2*  --   --  7.9*  MG 2.6*  --  2.7* 2.8*  PHOS 4.8*  --  4.7* 3.1   Liver Function Tests  Recent Labs  09/17/17 0550 09/18/17 0438  AST 44* 40  ALT 31 27  ALKPHOS 75 68  BILITOT 0.7 0.8  PROT 7.0 6.6  ALBUMIN 2.3* 2.7*   No results for input(s): LIPASE, AMYLASE in the last 72 hours. Cardiac Enzymes No results for input(s): CKTOTAL, CKMB, CKMBINDEX, TROPONINI in the last 72 hours.  BNP: BNP (last 3 results)  Recent Labs  08/23/17 1927  BNP 200.0*    ProBNP (last 3 results) No results for input(s): PROBNP in the last 8760 hours.   D-Dimer No results for input(s): DDIMER in the last 72  hours. Hemoglobin A1C No results for input(s): HGBA1C in the last 72 hours. Fasting Lipid Panel No results for input(s): CHOL, HDL, LDLCALC, TRIG, CHOLHDL, LDLDIRECT in the last 72 hours. Thyroid Function Tests No results for input(s): TSH, T4TOTAL, T3FREE, THYROIDAB in the last 72 hours.  Invalid input(s): FREET3  Other results:   Imaging    Dg Chest Port 1 View  Result Date: 09/17/2017 CLINICAL DATA:  Endotracheal tube removal and tracheostomy appliance placement. EXAM: PORTABLE CHEST 1 VIEW COMPARISON:  Portable chest x-ray of earlier today. FINDINGS: The endotracheal tube is been removed. A tracheostomy tube is present whose tip projects at the inferior margin of the clavicular heads. The lungs are adequately inflated. The right chest tube is in stable position. There is no definite right-sided pneumothorax. On the left there is density that projects over the anterior aspects of the third and fourth ribs which is stable. There is no pneumothorax there is persistent left lower lobe atelectasis or pneumonia. There is a trace of pleural fluid on the left. The cardiac silhouette is enlarged. The central pulmonary vascularity is mildly prominent. There is calcification in the wall of the aortic arch. The sternal wires are intact. The right subclavian venous catheter tip projects over the midportion of the SVC. IMPRESSION: No complication following exchange of the endotracheal tube for an tracheostomy tube. Stable appearance of both lungs. Electronically Signed   By: [name]  Martinique M.D.   On: 09/17/2017 12:29   Dg Abd Portable 1v  Result Date: 09/17/2017 CLINICAL DATA:  66 year old male status post feeding tube placement EXAM: PORTABLE ABDOMEN - 1 VIEW COMPARISON:  Prior abdominal radiograph 09/08/2017 FINDINGS: A transpyloric feeding tube is present. The tip of the tube overlies the fourth portion of the duodenum. The visualized bowel gas pattern is not obstructed. Left basilar airspace opacity  favored to reflect atelectasis. Patient is status post median sternotomy with evidence of prior multivessel CABG. IMPRESSION: The tip of the transpyloric feeding tube overlies the fourth portion of the duodenum. Electronically Signed   By: Jacqulynn Cadet M.D.   On: 09/17/2017 15:53     Medications:     Scheduled Medications: . amiodarone  200 mg Per Tube BID  . aspirin EC  325 mg Oral Daily   Or  . aspirin  324 mg Per Tube Daily  . atorvastatin  80 mg Per Tube q1800  . bisacodyl  10 mg Oral Daily   Or  .  bisacodyl  10 mg Rectal Daily  . brimonidine  1 drop Right Eye BID  . budesonide (PULMICORT) nebulizer solution  0.25 mg Nebulization BID  . chlorhexidine gluconate (MEDLINE KIT)  15 mL Mouth Rinse BID  . Chlorhexidine Gluconate Cloth  6 each Topical Daily  . docusate  200 mg Oral Daily  . feeding supplement (PRO-STAT SUGAR FREE 64)  30 mL Per Tube BID  . feeding supplement (VITAL HIGH PROTEIN)  1,000 mL Per Tube Q24H  . hydrocortisone sod succinate (SOLU-CORTEF) inj  50 mg Intravenous Daily  . insulin aspart  0-24 Units Subcutaneous Q4H  . levalbuterol  1.25 mg Nebulization Q6H  . mouth rinse  15 mL Mouth Rinse 10 times per day  . metoCLOPramide (REGLAN) injection  10 mg Intravenous Q6H  . mupirocin cream   Topical BID  . pantoprazole (PROTONIX) IV  40 mg Intravenous Q24H  . sodium chloride flush  10-40 mL Intracatheter Q12H  . sodium chloride flush  3 mL Intravenous Q12H  . spironolactone  12.5 mg Per Tube Daily  . warfarin  2.5 mg Per Tube Daily  . Warfarin - Physician Dosing Inpatient   Does not apply q1800    Infusions: . sodium chloride    . sodium chloride 20 mL/hr at 09/18/17 0600  . dextrose 5 % and 0.45% NaCl 30 mL/hr at 09/18/17 0600  . feeding supplement (VITAL 1.5 CAL) 1,000 mL (09/18/17 0600)  . fentaNYL infusion INTRAVENOUS 75 mcg/hr (09/18/17 0600)  . heparin 1,200 Units/hr (09/18/17 0600)  . lactated ringers Stopped (09/13/17 1702)  . phenylephrine  (NEO-SYNEPHRINE) Adult infusion    . piperacillin-tazobactam (ZOSYN)  IV 3.375 g (09/18/17 0531)  . potassium chloride Stopped (09/15/17 1754)  . propofol      PRN Medications: acetaminophen (TYLENOL) oral liquid 160 mg/5 mL, fentaNYL, metoprolol tartrate, midazolam, midazolam, ondansetron (ZOFRAN) IV, oxyCODONE, potassium chloride, sodium chloride flush, sodium chloride flush    Patient Profile   Mr Markie is a 66 year old with a history of DMII, smoker, CAD S/P PCI RCA 20 years ago, HTN, PAD, and hyperlipidemia admitted with CP and inferior MI with RV infarction.  He underwent LHC with IABP. After optimized, underwent CABG x4 centrimag for RV infarct on 9/20 and continued IABP.   Assessment/Plan   1. Post-cardiotomy cardiogenic shock: Primarily RV failure from inferior infarct with RV involvement.  Repeat echo on 9/24 showed vigorous LV systolic function with moderate RV dysfunction.  Now s/p CABG and Centrimag for RV support on 9/20. IABP removed 9/24 with good LV function.  Centrimag out 10/1. ECHO 09/17/2017 EF 60-65%. Grade IDD, RV with some improvement/less dilated.  Off diuretics. CVP 7. Hold lasix.  Continue 12.5 mg spiro daily.  2. CAD: Inferior MI with RV involvement at presentation, now s/p CABG.   Cut back asa to 81 mg daily. INR 2.7.  - Continue statin.  3. Atrial fibrillation/flutter: DCCV 9/29, now remains in NSR.  Maintaining NSR.  - Continue amiodarone per tube. Continue amio 200 mg twice a day,  - Heparin held 09/16/17 with bleeding around CT.  - Heparin restarted this morning. Dosing per Pharmacy.  4. AKI - Creatinine 1.75. Off lasix.  5. Acute respiratory failure: Suspect left-sided HCAP, on broad spectrum antibiotics.  - Self-extubated 09/15/17. Had resp distress overnight into 09/16/17, remained weak requiring re-intubation.  -S/P Trach 09/17/2017.  6. Neuro: ? L-sided weakness now resolved.  Head CT 10/5 negative. No change.  7. ID: Coag negative Staph on 9/28  blood culture, possible HCAP versus contaminant.  - WBC 7.7 . Febrile Max 102.5 => recultured.  - Blood CX 10/3- NGTD.   - Received 16 days Vancomycin. Remains on zosyn.  8. Anemia: Received 2 uPRBCs 09/04/17. 4 units PRBCs on 9/29. Received 2U PRBCs 10/1.   - Hgb 10.2 -> 9.0-> 7.8. Blood per Dr Darcey Nora.  Heparin restarted at midnight.   10. Unstageable Pressure Ulcer -  R Buttock partial thickness. Left buttock with slough. Start santyl to left buttock for selective debridement.  Continue reposition. On tube feeds.  11. Diabetes: Difficult to control, appreciate help from diabetes coordinator.  Restart lantus and continue sliding scale.  12.Deconditioning: PT consult pending. OOB today.     Darrick Grinder, NP  09/18/2017 7:00 AM   Advanced Heart Failure Team Pager [phone] (M-F; 7a - 4p)  Please contact [facility] for night-coverage after hours (4p -7a ) and weekends on [email or web address]  Patient seen with NP, agree with the above note.   Repeat echo done yesterday: LV EF remains vigorous.  RV function does show some improvement.    He is hemodynamically stable, on trach collar.  CVP 7-8, holding Lasix for now.  Creatinine down a bit from 1.8 to 1.75.   He is in NSR on amiodarone.  INR therapeutic, can stop heparin gtt.   Febrile last night. CXR with left base airspace disease.  He had completed course of vancomycin and remains on Zosyn.  He was recultured.   He is markedly deconditioned, will need LTAC.   Loralie Champagne 09/18/2017 9:08 AM

## 2017-09-18 NOTE — Plan of Care (Signed)
Problem: Activity: Goal: Risk for activity intolerance will decrease Outcome: Not Progressing Pt remains intubated and slightly sedated  Problem: Bowel/Gastric: Goal: Gastrointestinal status for postoperative course will improve Outcome: Progressing Tolerating tube feeds at goal  Problem: Education: Goal: Ability to demonstrate proper wound care will improve Outcome: Not Progressing Education ongoing

## 2017-09-18 NOTE — Progress Notes (Signed)
10 Days Post-Op Procedure(s) (LRB): REMOVAL OF R-VENTRICULAR ASSIST DEVICE WITH CARDIOPULMONARY BYPASS (N/A) TRANSESOPHAGEAL ECHOCARDIOGRAM (TEE) (N/A) Subjective: Fever 102 after trach- prob pulmonary in origin Patient  recultured- heavy secretions Cover with vanc, diflucan, fortaz until cultures back Pt had several hours of trach trial  today inr now therapeutic- stop heparin for PAF TF thru Cortrack w/o residual Objective: Vital signs in last 24 hours: Temp:  [99.4 F (37.4 C)-102.5 F (39.2 C)] 99.9 F (37.7 C) (10/11 0756) Pulse Rate:  [51-99] 99 (10/11 1523) Cardiac Rhythm: Normal sinus rhythm (10/11 1210) Resp:  [17-33] 21 (10/11 1523) BP: (96-134)/(52-76) 121/70 (10/11 1523) SpO2:  [94 %-100 %] 100 % (10/11 1523) FiO2 (%):  [40 %] 40 % (10/11 1520) Weight:  [179 lb 3.7 oz (81.3 kg)] 179 lb 3.7 oz (81.3 kg) (10/11 0500)  Hemodynamic parameters for last 24 hours: CVP:  [7 mmHg-11 mmHg] 7 mmHg  Intake/Output from previous day: 10/10 0701 - 10/11 0700 In: 2527.4 [I.V.:1275.6; NG/GT:551.8; IV Piggyback:700] Out: 2000 [Urine:2000] Intake/Output this shift: Total I/O In: 328.9 [I.V.:118.9; NG/GT:160; IV Piggyback:50] Out: 450 [Urine:450]       Exam    General- alert and comfortable   Lungs- clear without rales, wheezes   Cor- regular rate and rhythm, no murmur , gallop   Abdomen- soft, non-tender   Extremities - warm, non-tender, minimal edema   Neuro- oriented, appropriate, no focal weakness   Lab Results:  Recent Labs  09/17/17 0550 09/17/17 1621 09/18/17 0438  WBC 9.8  --  7.7  HGB 9.0* 8.8* 7.8*  HCT 29.7* 26.0* 26.2*  PLT 352  --  278   BMET:  Recent Labs  09/17/17 0550 09/17/17 1621 09/18/17 0438  NA 149* 150* 148*  K 4.5 4.7 4.0  CL 110 111 114*  CO2 29  --  28  GLUCOSE 121* 210* 230*  BUN 60* 57* 61*  CREATININE 1.81* 1.80* 1.75*  CALCIUM 8.2*  --  7.9*    PT/INR:  Recent Labs  09/18/17 0636  LABPROT 28.5*  INR 2.71   ABG     Component Value Date/Time   PHART 7.475 (H) 09/17/2017 1435   HCO3 27.8 09/17/2017 1435   TCO2 21 (L) 09/17/2017 1621   ACIDBASEDEF 0.5 09/10/2017 0325   O2SAT 79.7 09/18/2017 0435   CBG (last 3)   Recent Labs  09/18/17 0435 09/18/17 0759 09/18/17 1256  GLUCAP 221* 247* 217*    Assessment/Plan: S/P Procedure(s) (LRB): REMOVAL OF R-VENTRICULAR ASSIST DEVICE WITH CARDIOPULMONARY BYPASS (N/A) TRANSESOPHAGEAL ECHOCARDIOGRAM (TEE) (N/A) Slow progress Will have pt eval for LTAC next week Cont iv amio for now Postop blood loss anemia- iv Iron  LOS: 26 days    Clinton Harrington 09/18/2017

## 2017-09-18 NOTE — Progress Notes (Signed)
I attempted to place pt back on vent wean, pt immediately became tachypnic w/ RR 40's.  Placed pt back on full vent support.

## 2017-09-18 NOTE — Progress Notes (Signed)
Nutrition Follow-up  DOCUMENTATION CODES:   Not applicable  INTERVENTION:  Advance TF via Cortrak NGT with Vital 1.5 formula to goal rate of 50 ml/h (1200 ml per day) and Prostat 30 ml TID to provide 2100 kcals, 126 gm protein, 912 ml free water daily.  NUTRITION DIAGNOSIS:   Inadequate oral intake related to inability to eat as evidenced by NPO status; ongoing  GOAL:   Patient will meet greater than or equal to 90% of their needs; progressing  MONITOR:   Vent status, TF tolerance, Weight trends, Skin, Labs, I & O's  REASON FOR ASSESSMENT:   Ventilator, Other (Comment) (New Tube Feeding)    ASSESSMENT:   66 yo Male with PMH of DM, HTN; found to have severe three-vessel disease; presented for surgical intervention.  S/p CABG x4 and Centrimag RVAD 9/20  Centrimag RVAD removed 10/1 Self-extubated 10/8 Re-intubated 10/9 for airway protection, inability to swallow. Tracheostomy 10/10. Cortrak NGT placed 10/10.  Patient is currently intubated on ventilator support MV: 13.5 L/min Temp (24hrs), Avg:100.4 F (38 C), Min:99.4 F (37.4 C), Max:102.5 F (39.2 C)  Propofol: none  Pt placed back on full vent support with trach. Pt currently has Vital 1.5 formula infusing via Cortrak NGT at rate of 40 ml/hr with 30 ml Prostat BID which provides 1640 kcal (81% of kcal needs), 95 grams of protein (79% of protein needs). RD to re-consulted to modify tube feeding orders to better meet nutrition needs.  Labs and medications reviewed.   Diet Order:  Diet NPO time specified  Skin:  Wound (see comment) (Stage III to thigh,Stage II to sacrum)  Last BM:  10/11  Height:   Ht Readings from Last 1 Encounters:  09/12/17  (1.803 m)    Weight:   Wt Readings from Last 1 Encounters:  09/18/17 179 lb 3.7 oz (81.3 kg)    Ideal Body Weight:  78.1 kg  BMI:  Body mass index is 25 kg/m.  Estimated Nutritional Needs:   Kcal:  2021  Protein:  120-135 gm  Fluid:  per  MD  EDUCATION NEEDS:   No education needs identified at this time  Roslyn Smiling, MS, RD, LDN Pager # [phone] After hours/ weekend pager # [phone]

## 2017-09-18 NOTE — Consult Note (Signed)
WOC Nurse wound follow up Wound type: evolving DTPIs left and right buttocks New area medial occiput  Measurement: Left  buttock: 6cm x 3cm x 0.1cm; 50% thin black slough/50% yellow  Right buttock: 3cm x 3.5cm x 0.1cm;  100% pink, clean  Occiput: 0.2cm x 0.3cm x 0.1cm; 100% pink, clean  Wound bed: see above Drainage (amount, consistency, odor) minimal, serosanguinous  Periwound: intact  Dressing procedure/placement/frequency: Santyl has been added for selective debridement on the left buttock, foam in place for the right buttock  SPORT mattress for pressure redistribution and moisture management  Will need LALM if moves from the ICU.  Maximize nutrition for wound healing. Foam protective dressing on the occiput, change every 3 days and PRN soilage.  WOC Nurse team will follow along with you for weekly wound assessments.  Please notify me of any acute changes in the wounds or any new areas of concerns Lucrezia Dehne [facility] MSN, RN,CWOCN, CNS, CWON-AP [phone]

## 2017-09-18 NOTE — Progress Notes (Signed)
Dr Donata Clay did not want to discontinue central line or discontinue foley. Does not believe that is the source of infection. Dr. Donata Clay advised he would write orders for antibiotics. Will continue to monitor.

## 2017-09-18 NOTE — Progress Notes (Signed)
[facility] BRIEF SICU PROGRESS NOTE  10 Days Post-Op  S/P Procedure(s) (LRB): REMOVAL OF R-VENTRICULAR ASSIST DEVICE WITH CARDIOPULMONARY BYPASS (N/A) TRANSESOPHAGEAL ECHOCARDIOGRAM (TEE) (N/A)   Stable day Afebrile last 8 hours O2 sats 100%, tolerating TC trials  Plan: Continue current plan  Purcell Nails, MD 09/18/2017 5:31 PM

## 2017-09-18 NOTE — Progress Notes (Signed)
 of fentanyl drip wasted in sink by Oren Bracket RN and witnessed by Michail Jewels RN.

## 2017-09-18 NOTE — Progress Notes (Signed)
PULMONARY / CRITICAL CARE MEDICINE   Name: Clinton Harrington MRN: [id number] DOB: 07-31-1951    ADMISSION DATE:  08/23/2017 CONSULTATION DATE:  08/30/2017  REFERRING MD:  Cornelius Moras  CHIEF COMPLAINT:  Acute respiratory failure with hypoxemia  BRIEF:  66 y/o male with CAD who had an MI on 9/15, had severe RV failure, was treated with CABG, IABP, eventual centrimag device placed.  Post operatively was weaned off of the RVAD device on 10/1, remains mechanically ventilated.  Currently being treated for LLL HCAP. Completed 12 abx.  trached 10/10. 10/11 spiked fever and pan cultured  SUBJECTIVE:  Tubed and for trache today  VITAL SIGNS: BP 113/60   Pulse 92   Temp 99.9 F (37.7 C) (Oral)   Resp (!) 30   Ht  (1.803 m)   Wt 179 lb 3.7 oz (81.3 kg)   SpO2 97%   BMI 25.00 kg/m   HEMODYNAMICS: CVP:  [7 mmHg-11 mmHg] 10 mmHg  VENTILATOR SETTINGS: Vent Mode: PRVC FiO2 (%):  [40 %-100 %] 40 % Set Rate:  [16 bmp] 16 bmp Vt Set:  [600 mL] 600 mL PEEP:  [5 cmH20] 5 cmH20 Pressure Support:  [5 cmH20] 5 cmH20 Plateau Pressure:  [17 cmH20-19 cmH20] 18 cmH20  INTAKE / OUTPUT: I/O last 3 completed shifts: In: 3312.4 [I.V.:1960.6; NG/GT:551.8; IV Piggyback:800] Out: 2520 [Urine:2520]  PHYSICAL EXAMINATION: General:  Ill appearing male in NAD HEENT: MM pink/moist,trach CDI WUJ:WJXB affect Neuro: Intact CV: s1s2 rrr, no m/r/g nsr PULM: even/non-labored, lungs bilaterally decreased in bases JY:NWGN, non-tender, bsx4 active TF Extremities: warm/dry- edema  Skin: no rashes or lesions     LABS:  BMET  Recent Labs Lab 09/16/17 0305  09/17/17 0550 09/17/17 1621 09/18/17 0438  NA 146*  < > 149* 150* 148*  K 3.8  < > 4.5 4.7 4.0  CL 108  < > 110 111 114*  CO2 30  --  29  --  28  BUN 39*  < > 60* 57* 61*  CREATININE 1.18  < > 1.81* 1.80* 1.75*  GLUCOSE 97  < > 121* 210* 230*  < > = values in this interval not displayed.  Electrolytes  Recent Labs Lab 09/16/17 0305  09/17/17 0550 09/17/17 1700 09/18/17 0438  CALCIUM 8.5* 8.2*  --  7.9*  MG 2.2 2.6* 2.7* 2.8*  PHOS 3.2 4.8* 4.7* 3.1    CBC  Recent Labs Lab 09/16/17 0305  09/17/17 0550 09/17/17 1621 09/18/17 0438  WBC 10.0  --  9.8  --  7.7  HGB 10.2*  < > 9.0* 8.8* 7.8*  HCT 32.4*  < > 29.7* 26.0* 26.2*  PLT 408*  --  352  --  278  < > = values in this interval not displayed.  Coag's  Recent Labs Lab 09/17/17 0550 09/18/17 0438 09/18/17 0636  APTT 31  --   --   INR 1.64 2.71 2.71   Sepsis Markers No results for input(s): LATICACIDVEN, PROCALCITON, O2SATVEN in the last 168 hours.  ABG  Recent Labs Lab 09/16/17 0015 09/17/17 0245 09/17/17 1435  PHART 7.536* 7.493* 7.475*  PCO2ART 36.3 37.7 38.2  PO2ART 55.1* 116* 306*   Liver Enzymes  Recent Labs Lab 09/16/17 0305 09/17/17 0550 09/18/17 0438  AST 39 44* 40  ALT ALKPHOS 76 75 68  BILITOT 0.6 0.7 0.8  ALBUMIN 2.4* 2.3* 2.7*   Cardiac Enzymes No results for input(s): TROPONINI, PROBNP in the last 168 hours.  Glucose  Recent Labs Lab 09/17/17 0754 09/17/17 1127 09/17/17 1611 09/17/17 1952 09/17/17 2333 09/18/17 0435  GLUCAP 136* 149* 185* 186* 200* 221*   Imaging Dg Chest Port 1 View  Result Date: 09/18/2017 CLINICAL DATA:  Tracheostomy.  CABG.  Respiratory failure. EXAM: PORTABLE CHEST 1 VIEW COMPARISON:  One day prior FINDINGS: Right-sided PICC line terminates at the mid SVC. Tracheostomy is appropriately positioned. Feeding tube extends beyond the inferior aspect of the film. Numerous leads and wires project over the chest. Prior median sternotomy. Midline trachea. Cardiomegaly accentuated by AP portable technique. Small left pleural effusion persists. No pneumothorax. Mild interstitial edema. Removal of right chest tube. Developing left base airspace disease. IMPRESSION: Removal of right chest tube, without pneumothorax. Mild congestive heart failure with similar small left pleural effusion.  Developing left base airspace disease. Electronically Signed   By: Jeronimo Greaves M.D.   On: 09/18/2017 07:45   Dg Chest Port 1 View  Result Date: 09/17/2017 CLINICAL DATA:  Endotracheal tube removal and tracheostomy appliance placement. EXAM: PORTABLE CHEST 1 VIEW COMPARISON:  Portable chest x-ray of earlier today. FINDINGS: The endotracheal tube is been removed. A tracheostomy tube is present whose tip projects at the inferior margin of the clavicular heads. The lungs are adequately inflated. The right chest tube is in stable position. There is no definite right-sided pneumothorax. On the left there is density that projects over the anterior aspects of the third and fourth ribs which is stable. There is no pneumothorax there is persistent left lower lobe atelectasis or pneumonia. There is a trace of pleural fluid on the left. The cardiac silhouette is enlarged. The central pulmonary vascularity is mildly prominent. There is calcification in the wall of the aortic arch. The sternal wires are intact. The right subclavian venous catheter tip projects over the midportion of the SVC. IMPRESSION: No complication following exchange of the endotracheal tube for an tracheostomy tube. Stable appearance of both lungs. Electronically Signed   By: [name]  [place] M.D.   On: 09/17/2017 12:29   Dg Abd Portable 1v  Result Date: 09/17/2017 CLINICAL DATA:  66 year old male status post feeding tube placement EXAM: PORTABLE ABDOMEN - 1 VIEW COMPARISON:  Prior abdominal radiograph 09/08/2017 FINDINGS: A transpyloric feeding tube is present. The tip of the tube overlies the fourth portion of the duodenum. The visualized bowel gas pattern is not obstructed. Left basilar airspace opacity favored to reflect atelectasis. Patient is status post median sternotomy with evidence of prior multivessel CABG. IMPRESSION: The tip of the transpyloric feeding tube overlies the fourth portion of the duodenum. Electronically Signed   By: Malachy Moan M.D.   On: 09/17/2017 15:53     CULTURES: 9/22 sputum>>>ng 9/24>> Sputum>>ng 9/24 Blood>>NEG  9/24 Urine>>ng 9/28 BC x 2 >coag negative staph 1/4 10/2>> respiratory flora, candida albicans 10/11 bc x 2>> 10/11 Sputum>> 10/11 UC>>  ANTIBIOTICS: zinacef >>>9/22- 9/24 ceftaz 9/22>>>9/28 Vanc 9/20>> off Zosyn 9/28 >>10/11  SIGNIFICANT EVENTS: 08/23/2017 - admit 9/22 - intubated Cath cabg RV assist device 9/23 - On epi/levo/RVAD/vent.  CI 1.8 despite all interventions 9/24>> Epi, Levo, milrinine,amio,insulin gtt, Lasix gtt, Heparin, Fent, Versed 9/26 - Remains sedated on fent/ versed drips RVAD flows down to 3200 Good UO on lasix gtt @ 15 9/27 - 40% fio2, peep 8. RPM on RVAD turned down. Stil on levophed, milrinone, epi ggtt and heparin.admio gtt. Making urine. Has chest draines. Good urine outoput. Heparin on hold briefly before resumptions due to bleed in groin site. Getting 2 UI  PRBC 9/30 mucous plugging requiring multiple lavages 10/1 RV assist device  removed  in the OR 2/2 bleeding 1-0/2 - Stable off RVAD, remains on levo, vaso,Epi, milrinone 10/8 self extubated 10/9 intubated 10/10 trach  STUDIES: 10/6 CT head > NAICP, may need MRI to assess further  Lines/tubes: 10/1 R subclavian CVL >  ETT out 10/8, self extubation 10/9 ETT>>10/10 Trach 10/10>>  DISCUSSION: 66 y/o male s/p MI, RV failure, CABG, centrimag RV support deviced (now weaned off) who remains intubated for respiratory failure.  Currently being treated for HCAP.  10/9 post 24 hr self extubation and may need intubation. Re intubated 10/9 and  trached 10/10. 10/11 on t collar. Will need to pursue LTAC placement  ASSESSMENT / PLAN:  PULMONARY A: Acute respiratory failure with hypoxemia > improving HCAP > infiltrate worse on left Self extubated 10/8 0300 10/9 remains tenuous, requiring NTS for poor cough mechanics  10/9 re intubated for safety  10/10 trached 10/11 on t collar P:    Titrate O2 for sat of 88-92% Pulmonary toilet as tolerated Mobilize as tolerated Note: He is on day 26 of hospitalization, extremely de conditioned, and was reintubated 10/9. He will need LTACH once trache stable 10/10 trached 10/11 on t collar  CARDIOVASCULAR A:  Cardiogenic shock > off pressors CAD, s/p MI and CABG RV failure, off assist devices PAF P:  arrhythmia meds per cardiology TCTS Tele Heparin per pharm  RENAL Lab Results  Component Value Date   CREATININE 1.75 (H) 09/18/2017   CREATININE 1.80 (H) 09/17/2017   CREATININE 1.81 (H) 09/17/2017    Recent Labs Lab 09/17/17 0550 09/17/17 1621 09/18/17 0438  K 4.5 4.7 4.0     A:   AKI>  stable P:   Monitor BMET and UOP Replace electrolytes as needed  GASTROINTESTINAL A:   No acute issues P:   Continue tube feeding  HEMATOLOGIC  Recent Labs  09/17/17 1621 09/18/17 0438  HGB 8.8* 7.8*   Lab Results  Component Value Date   INR 2.71 09/18/2017   INR 2.71 09/18/2017   INR 1.64 09/17/2017    A:   No acute issues P:  Monitor for bleeding Heparin resumed 10/11 per pharm INR noted  INFECTIOUS A:   HCAP, fever on 10/6 Coag neg staph in blood> most likely contaminant 10/11 fever spike 102.4 P:   Trach aspirate NTD /zosyn day 13, Spiked 102.4 10/11 thru zoysn. Trached and chest tube removed. 10/11 will dc zoysn, pan culture, if continues to spike fever will start V/Z  ENDOCRINE CBG (last 3)   Recent Labs  09/17/17 1952 09/17/17 2333 09/18/17 0435  GLUCAP 186* 200* 221*     A:   DM2, persistent hyperglycemia   P:   Continue SSI Glargine10 qd. Increase to bid 10/11 Stopped steroids 10/11 Monitor closely on tube feeds  NEUROLOGIC A:   10/5 New left sided weakness> resolved  10/6 10/9 weak but follows commands 10/9 reintubation therefore back on sedation till trache and weaning resumes. Trached 10/10 and off sedation P:   Awake and alert , on t collar, dangled with  PT  FAMILY  - Updates:  Patient updated bedside   App cct 30 min  Brett Canales Dorion Petillo ACNP [facility] Pollack PCCM Pager [phone] till 3 pm If no answer page [phone]

## 2017-09-18 NOTE — Progress Notes (Signed)
Physical Therapy Treatment Patient Details Name: Clinton Harrington MRN: [id number] DOB: 1951-10-03 Today's Date: 09/18/2017    History of Present Illness Patient is a 66 y/o Caucasian M with hx CADs/p BMS to RCA in 1999 (due to an inferior STEMI), significant PVD, HTN, Type 2 DM (non-insulin dependent), HLP, Tobacco use comes in today complaining of substernal chest pain that started last night at 11 pm. He had associated SOB, nausea, diaphoresis. Pt underwent CABGx4 on 9/20. Pt then underwent removal of R VAD with cardiopulmonary bypass on 10/1. Pt self extubated on 10/8. reintubated 09/17/17    PT Comments    Pt again making progress towards his goals. Pt is limited by 2/5 weakness of both L UE and LE. Pt currently maxAx2 for bed mobility and maxAx3 for transfer to recliner. Pt able to participate in limited therex in seated due to fatigue. Pt requires skilled PT to progress mobility and improve strength and endurance to safely navigate their discharge environment.    Follow Up Recommendations  CIR;Supervision/Assistance - 24 hour     Equipment Recommendations   (TBD)    Recommendations for Other Services Rehab consult     Precautions / Restrictions Precautions Precautions: Sternal Restrictions Weight Bearing Restrictions: Yes Other Position/Activity Restrictions: sternal prec    Mobility  Bed Mobility Overal bed mobility: Needs Assistance Bed Mobility: Sit to Supine       Sit to supine: Max assist;+2 for physical assistance   General bed mobility comments: maxAx2 for trunk to upright and pad scoot to EoB, pt initiated LE movement off of bed  Transfers Overall transfer level: Needs assistance Equipment used:  (2 person lateral scoot with bed pad) Transfers: Lateral/Scoot Transfers          Lateral/Scoot Transfers: +2 physical assistance;Total assist;+2 safety/equipment General transfer comment: maxAx2 for lateral scoot/squat pivot transfer to from chair to EOB  wtih bed pad and arm of drop arm recliner dropped, transfered to the R  Ambulation/Gait             General Gait Details: currently unable         Balance Overall balance assessment: Needs assistance Sitting-balance support: Feet supported;Bilateral upper extremity supported Sitting balance-Leahy Scale: Poor Sitting balance - Comments: pt required minA for maintaining upright, increased L lateral lean in bed, EoB and seated in recliner  Postural control: Left lateral lean Standing balance support:  (unable to achieve)                                Cognition Arousal/Alertness: Awake/alert Behavior During Therapy: WFL for tasks assessed/performed Overall Cognitive Status: Impaired/Different from baseline Area of Impairment: Problem solving                             Problem Solving: Slow processing;Decreased initiation;Difficulty sequencing;Requires verbal cues;Requires tactile cues General Comments: pt able to follow one step commands with increased time       Exercises General Exercises - Upper Extremity Shoulder Flexion: AROM;AAROM;Both;5 reps (able to lift R arm 6 inches and L arm 2 inches) General Exercises - Lower Extremity Long Arc Quad: AROM;AAROM;5 reps;Both;Seated (AROM on R, AAROM on Left, some activation on L but very weak)    General Comments General comments (skin integrity, edema, etc.): VSS throughout session       Pertinent Vitals/Pain Pain Assessment: Faces Faces Pain Scale: Hurts even more Pain Descriptors /  Indicators:  (unable to relay) Pain Intervention(s): Monitored during session;Limited activity within patient's tolerance           PT Goals (current goals can now be found in the care plan section) Acute Rehab PT Goals PT Goal Formulation: With patient Time For Goal Achievement: 09/29/17 Potential to Achieve Goals: Good Progress towards PT goals: Progressing toward goals    Frequency    Min  3X/week      PT Plan Current plan remains appropriate    Co-evaluation              AM-PAC PT "6 Clicks" Daily Activity  Outcome Measure  Difficulty turning over in bed (including adjusting bedclothes, sheets and blankets)?: Unable Difficulty moving from lying on back to sitting on the side of the bed? : Unable Difficulty sitting down on and standing up from a chair with arms (e.g., wheelchair, bedside commode, etc,.)?: Unable Help needed moving to and from a bed to chair (including a wheelchair)?: Total Help needed walking in hospital room?: Total Help needed climbing 3-5 steps with a railing? : Total 6 Click Score: 6    End of Session Equipment Utilized During Treatment: Oxygen (chest tube) Activity Tolerance: Patient tolerated treatment well Patient left: with call bell/phone within reach;with nursing/sitter in room;in chair Nurse Communication: Mobility status PT Visit Diagnosis: Unsteadiness on feet (R26.81);Muscle weakness (generalized) (M62.81);Difficulty in walking, not elsewhere classified (R26.2);Other abnormalities of gait and mobility (R26.89)     Time: 1610-9604 PT Time Calculation (min) (ACUTE ONLY): 36 min  Charges:  $Therapeutic Activity: 23-37 mins                    G Codes:       Howard Bunte B. Beverely Risen PT, DPT Acute Rehabilitation  [phone] Pager [phone]     Elon Alas Fleet 09/18/2017, 5:37 PM

## 2017-09-19 ENCOUNTER — Encounter (HOSPITAL_COMMUNITY): Payer: PPO

## 2017-09-19 ENCOUNTER — Ambulatory Visit: Payer: PPO | Admitting: Family

## 2017-09-19 ENCOUNTER — Ambulatory Visit: Payer: PPO

## 2017-09-19 ENCOUNTER — Inpatient Hospital Stay (HOSPITAL_COMMUNITY): Payer: PPO

## 2017-09-19 LAB — CORTISOL-AM, BLOOD: Cortisol - AM: 16.1 ug/dL (ref 6.7–22.6)

## 2017-09-19 LAB — URINE CULTURE: Culture: NO GROWTH

## 2017-09-19 LAB — PROTIME-INR
INR: 1.72
Prothrombin Time: 20 seconds — ABNORMAL HIGH (ref 11.4–15.2)

## 2017-09-19 LAB — COMPREHENSIVE METABOLIC PANEL
ALT: 33 U/L (ref 17–63)
AST: 62 U/L — ABNORMAL HIGH (ref 15–41)
Albumin: 2.6 g/dL — ABNORMAL LOW (ref 3.5–5.0)
Alkaline Phosphatase: 73 U/L (ref 38–126)
Anion gap: 9 (ref 5–15)
BUN: 50 mg/dL — ABNORMAL HIGH (ref 6–20)
CO2: 26 mmol/L (ref 22–32)
Calcium: 8.1 mg/dL — ABNORMAL LOW (ref 8.9–10.3)
Chloride: 117 mmol/L — ABNORMAL HIGH (ref 101–111)
Creatinine, Ser: 1.35 mg/dL — ABNORMAL HIGH (ref 0.61–1.24)
GFR calc Af Amer: >60 mL/min (ref >60)
GFR calc non Af Amer: 54 mL/min — ABNORMAL LOW (ref >60)
Glucose, Bld: 205 mg/dL — ABNORMAL HIGH (ref 65–99)
Potassium: 3.8 mmol/L (ref 3.5–5.1)
Sodium: 152 mmol/L — ABNORMAL HIGH (ref 135–145)
Total Bilirubin: 0.8 mg/dL (ref 0.3–1.2)
Total Protein: 7.2 g/dL (ref 6.5–8.1)

## 2017-09-19 LAB — COOXEMETRY PANEL
Carboxyhemoglobin: 1.6 % — ABNORMAL HIGH (ref 0.5–1.5)
Methemoglobin: 1.2 % (ref 0.0–1.5)
O2 Saturation: 60.9 %
Total hemoglobin: 9.1 g/dL — ABNORMAL LOW (ref 12.0–16.0)

## 2017-09-19 LAB — CBC
HCT: 28.9 % — ABNORMAL LOW (ref 39.0–52.0)
Hemoglobin: 8.4 g/dL — ABNORMAL LOW (ref 13.0–17.0)
MCH: 28.4 pg (ref 26.0–34.0)
MCHC: 29.1 g/dL — ABNORMAL LOW (ref 30.0–36.0)
MCV: 97.6 fL (ref 78.0–100.0)
Platelets: 251 10*3/uL (ref 150–400)
RBC: 2.96 MIL/uL — ABNORMAL LOW (ref 4.22–5.81)
RDW: 16.5 % — ABNORMAL HIGH (ref 11.5–15.5)
WBC: 8.3 10*3/uL (ref 4.0–10.5)

## 2017-09-19 LAB — GLUCOSE, CAPILLARY
GLUCOSE-CAPILLARY: 187 mg/dL — AB (ref 65–99)
GLUCOSE-CAPILLARY: 222 mg/dL — AB (ref 65–99)
Glucose-Capillary: 169 mg/dL — ABNORMAL HIGH (ref 65–99)
Glucose-Capillary: 185 mg/dL — ABNORMAL HIGH (ref 65–99)
Glucose-Capillary: 219 mg/dL — ABNORMAL HIGH (ref 65–99)

## 2017-09-19 LAB — MAGNESIUM: MAGNESIUM: 2.9 mg/dL — AB (ref 1.7–2.4)

## 2017-09-19 LAB — PROCALCITONIN: Procalcitonin: 0.34 ng/mL

## 2017-09-19 LAB — PHOSPHORUS: PHOSPHORUS: 2.6 mg/dL (ref 2.5–4.6)

## 2017-09-19 MED ORDER — WARFARIN SODIUM 2.5 MG PO TABS
2.5000 mg | ORAL_TABLET | Freq: Every day | ORAL | Status: DC
  Administered 2017-09-19: 2.5 mg via ORAL
  Filled 2017-09-19: qty 1

## 2017-09-19 MED ORDER — FREE WATER
200.0000 mL | Freq: Three times a day (TID) | Status: DC
  Administered 2017-09-19 – 2017-09-23 (×13): 200 mL

## 2017-09-19 NOTE — Plan of Care (Signed)
Problem: Activity: Goal: Risk for activity intolerance will decrease Outcome: Progressing Pt oob to chair 09/18/2017   Problem: Bowel/Gastric: Goal: Gastrointestinal status for postoperative course will improve Outcome: Progressing Tolerating tube feeds at goal. BM 09/18/2017  Problem: Education: Goal: Ability to demonstrate proper wound care will improve Outcome: Not Progressing Remains intubated Goal: Knowledge of disease or condition will improve Outcome: Progressing Education ongoing.

## 2017-09-19 NOTE — Progress Notes (Signed)
RN called d/t Dr Donata Clay requesting pt be placed back on vent d/t pt w/ fever.

## 2017-09-19 NOTE — Progress Notes (Signed)
Results for VAUN, HYNDMAN (MRN [id number]) as of 09/19/2017 14:25  Ref. Range 09/18/2017 15:52 09/18/2017 19:46 09/18/2017 23:31 09/19/2017 04:20 09/19/2017 07:47  Glucose-Capillary Latest Ref Range: 65 - 99 mg/dL 409 (H) 811 (H) 914 (H) 187 (H) 222 (H)  Noted that blood sugars have been greater than 180 mg/dl. Recommend increasing Lantus to 12 units BID if blood sugars continue to be elevated.  Will continue to monitor blood sugars while in the hospital.  Smith Mince RN BSN CDE Diabetes Coordinator Pager: [phone]  8am-5pm

## 2017-09-19 NOTE — Progress Notes (Signed)
      [address]       [place] [address]             [phone]      Sleeping at present  On vent, TC briefly earlier  BP 99/60   Pulse (!) 105   Temp 100.3 F (37.9 C)   Resp (!) 23   Ht  (1.803 m)   Wt 175 lb 14.8 oz (79.8 kg)   SpO2 97%   BMI 24.54 kg/m    Intake/Output Summary (Last 24 hours) at 09/19/17 1724 Last data filed at 09/19/17 1425  Gross per 24 hour  Intake             2278 ml  Output             1925 ml  Net              353 ml   Fever this AM  On vanco, ceftaz and diflucan  Zakya Halabi C. Dorris Fetch, MD [facility] [phone]

## 2017-09-19 NOTE — Progress Notes (Signed)
Patient ID: Clinton Harrington, male   DOB: 10-Oct-1951, 66 y.o.   MRN: [id number]        Advanced Heart Failure Rounding Note   Subjective:    Events CABG x4 IABP Centrimag 9/20    IABP removed 9/24.  Centrimag RVAD in place DC-CV successful 9/29  Centrimag removal 10/1 Self-extubated 10/8 Re-intubated 10/9 S/P Tach 10/10 Recultured 10/11  Remains febrile. Antibiotics broadened. Off diuretics.   Follows commands. Denies pain.    ECHO 09/17/2017  Left ventricle: The cavity size was normal. There was moderate   concentric hypertrophy. Systolic function was normal. The   estimated ejection fraction was in the range of 60% to 65%.   Although no diagnostic regional wall motion abnormality was   identified, this possibility cannot be completely excluded on the   basis of this study. Doppler parameters are consistent with   abnormal left ventricular relaxation (grade 1 diastolic   dysfunction). Doppler parameters are consistent with   indeterminate mean left atrial filling pressure. - Ventricular septum: Septal motion showed paradox. - Aortic valve: There was mild stenosis. Valve area (VTI): 1.99   cm^2. Valve area (Vmax): 1.99 cm^2. Valve area (Vmean): 1.99   cm^2. - Right ventricle: Systolic function was moderately reduced. Impressions: - Compared to 09/05/2017, the right ventricle appears less dilated   and there is some improvement in RV function.   Objective:   Weight Range: 175 lb 14.8 oz (79.8 kg) Body mass index is 24.54 kg/m.   Vital Signs:   Temp:  [99.1 F (37.3 C)-101.4 F (38.6 C)] 101.4 F (38.6 C) (10/12 0200) Pulse Rate:  [65-103] 99 (10/12 0700) Resp:  [18-33] 22 (10/12 0700) BP: (94-134)/(52-77) 110/64 (10/12 0600) SpO2:  [93 %-100 %] 96 % (10/12 0700) FiO2 (%):  [40 %] 40 % (10/12 0317) Weight:  [175 lb 14.8 oz (79.8 kg)] 175 lb 14.8 oz (79.8 kg) (10/12 0425) Last BM Date: 09/18/17  Weight change: Filed Weights   09/17/17 0500 09/18/17  0500 09/19/17 0425  Weight: 184 lb 1.4 oz (83.5 kg) 179 lb 3.7 oz (81.3 kg) 175 lb 14.8 oz (79.8 kg)    Intake/Output:   Intake/Output Summary (Last 24 hours) at 09/19/17 0716 Last data filed at 09/19/17 0700  Gross per 24 hour  Intake          2203.88 ml  Output             2250 ml  Net           -46.12 ml      Physical Exam   General:  Trach NAD.  HEENT: normal Neck: supple. JVP 6-7. Carotids 2+ bilat; no bruits. No lymphadenopathy or thryomegaly appreciated. Cor: PMI nondisplaced. Regular rate & rhythm. No rubs, gallops or murmurs. R subclavian central line.  Lungs: Rhonchi  Abdomen: soft, nontender, nondistended. No hepatosplenomegaly. No bruits or masses. Good bowel sounds. Extremities: no cyanosis, clubbing, rash, edema Neuro: Trach follows commands. MAE.  GU: foley  Skin: Buttock dressing intact. R groin with slough. 1x1 cm. No induration.   Telemetry  NSR 90-100s personally reviewed.   Labs    CBC  Recent Labs  09/18/17 0438 09/18/17 1707 09/19/17 0358  WBC 7.7  --  8.3  HGB 7.8* 8.8* 8.4*  HCT 26.2* 26.0* 28.9*  MCV 96.3  --  97.6  PLT 278  --  665   Basic Metabolic Panel  Recent Labs  09/18/17 0438 09/18/17 1707 09/18/17 1900 09/19/17 0358  NA  148* 157*  --  152*  K 4.0 3.7  --  3.8  CL 114* 116*  --  117*  CO2 28  --   --  26  GLUCOSE 230* 160*  --  205*  BUN 61* 48*  --  50*  CREATININE 1.75* 1.40*  --  1.35*  CALCIUM 7.9*  --   --  8.1*  MG 2.8*  --  3.0* 2.9*  PHOS 3.1  --  2.6 2.6   Liver Function Tests  Recent Labs  09/18/17 0438 09/19/17 0358  AST 40 62*  ALT 27 33  ALKPHOS 68 73  BILITOT 0.8 0.8  PROT 6.6 7.2  ALBUMIN 2.7* 2.6*   No results for input(s): LIPASE, AMYLASE in the last 72 hours. Cardiac Enzymes No results for input(s): CKTOTAL, CKMB, CKMBINDEX, TROPONINI in the last 72 hours.  BNP: BNP (last 3 results)  Recent Labs  08/23/17 1927  BNP 200.0*    ProBNP (last 3 results) No results for input(s):  PROBNP in the last 8760 hours.   D-Dimer No results for input(s): DDIMER in the last 72 hours. Hemoglobin A1C No results for input(s): HGBA1C in the last 72 hours. Fasting Lipid Panel No results for input(s): CHOL, HDL, LDLCALC, TRIG, CHOLHDL, LDLDIRECT in the last 72 hours. Thyroid Function Tests No results for input(s): TSH, T4TOTAL, T3FREE, THYROIDAB in the last 72 hours.  Invalid input(s): FREET3  Other results:   Imaging    No results found.   Medications:     Scheduled Medications: . amiodarone  200 mg Per Tube BID  . aspirin EC  81 mg Oral Daily   Or  . aspirin  81 mg Per Tube Daily  . atorvastatin  80 mg Per Tube q1800  . bisacodyl  10 mg Oral Daily   Or  . bisacodyl  10 mg Rectal Daily  . brimonidine  1 drop Right Eye BID  . budesonide (PULMICORT) nebulizer solution  0.25 mg Nebulization BID  . chlorhexidine gluconate (MEDLINE KIT)  15 mL Mouth Rinse BID  . Chlorhexidine Gluconate Cloth  6 each Topical Daily  . collagenase   Topical Daily  . docusate  200 mg Oral Daily  . feeding supplement (PRO-STAT SUGAR FREE 64)  30 mL Per Tube TID  . insulin aspart  0-24 Units Subcutaneous Q4H  . insulin glargine  10 Units Subcutaneous BID  . levalbuterol  1.25 mg Nebulization Q6H  . mouth rinse  15 mL Mouth Rinse 10 times per day  . metoCLOPramide (REGLAN) injection  10 mg Intravenous Q6H  . mupirocin cream   Topical BID  . pantoprazole sodium  40 mg Per Tube Daily  . sodium chloride flush  10-40 mL Intracatheter Q12H  . sodium chloride flush  3 mL Intravenous Q12H  . spironolactone  12.5 mg Per Tube Daily  . Warfarin - Physician Dosing Inpatient   Does not apply q1800    Infusions: . sodium chloride    . sodium chloride 20 mL/hr at 09/19/17 0700  . cefTAZidime (FORTAZ)  IV Stopped (09/19/17 6468)  . feeding supplement (VITAL 1.5 CAL) 1,000 mL (09/19/17 0700)  . fluconazole (DIFLUCAN) IV Stopped (09/18/17 1809)  . potassium chloride Stopped (09/15/17 1754)    . vancomycin Stopped (09/19/17 0504)    PRN Medications: acetaminophen (TYLENOL) oral liquid 160 mg/5 mL, metoprolol tartrate, midazolam, midazolam, ondansetron (ZOFRAN) IV, oxyCODONE, potassium chloride, sodium chloride flush, sodium chloride flush    Patient Profile   Clinton Harrington is  a 66 year old with a history of DMII, smoker, CAD S/P PCI RCA 20 years ago, HTN, PAD, and hyperlipidemia admitted with CP and inferior MI with RV infarction.  He underwent LHC with IABP. After optimized, underwent CABG x4 centrimag for RV infarct on 9/20 and continued IABP.   Assessment/Plan   1. Post-cardiotomy cardiogenic shock: Primarily RV failure from inferior infarct with RV involvement.  Repeat echo on 9/24 showed vigorous LV systolic function with moderate RV dysfunction.  Now s/p CABG and Centrimag for RV support on 9/20. IABP removed 9/24 with good LV function.  Centrimag out 10/1.  ECHO 09/17/2017 EF 60-65%, grade I DD, RV with some improvement/less dilated. Off diuretics. Volume status appears stable.  - Continue 12.5 mg spiro daily.  2. CAD: Inferior MI with RV involvement at presentation, now s/p CABG.   - Cut back asa to 81 mg daily.  - Continue statin.  3. Atrial fibrillation/flutter: DCCV 9/29, now remains in NSR.  - Continue amiodarone per tube. Continue amio 200 mg twice a day,  - Heparin held 09/16/17 with bleeding around CT.  - On warfarin, INR 1.7 today. Will adjust.  4. AKI: Creatinine trending down 1.75>1.3.  5. Acute respiratory failure: Suspect left-sided HCAP. Self-extubated 09/15/17. Had resp distress overnight into 09/16/17, remained weak requiring re-intubation.  S/P Trach 09/17/2017. On 40%.  - Trach collar trial again today.  - Abx broadened to vancomycin/ceftazidime/fluconazole with ongoing fevers.  6. Neuro: ? L-sided weakness now resolved.  Head CT 10/5 negative. No change.  7. ID: Coag negative Staph on 9/28 blood culture, possible HCAP versus contaminant.  WBC 8.3. Remains  febrile 101.4.  Re-cultured on 10/11. - Now on vanc, fortaz and fluconazole.   8. Anemia: Received 2 uPRBCs 09/04/17. 4 units PRBCs on 9/29. Received 2U PRBCs 10/1.  Hgb 10.2 -> 9.0-> 7.8->8.4.  He received IV Fe on 10/11.   10. Unstageable Pressure Ulcer - R Buttock partial thickness. Left buttock with slough. Start santyl to left buttock for selective debridement.  - Continue reposition. On tube feeds.  11. Diabetes: Better controlled with addition of lantus.  12. Deconditioning: PT consulted. CIR recommended.   13. Hypernatremia: Add free water boluses.      Darrick Grinder, NP  09/19/2017 7:16 AM   Advanced Heart Failure Team Pager [phone] (M-F; 7a - 4p)  Please contact [facility] for night-coverage after hours (4p -7a ) and weekends on [email or web address]  Patient seen with NP, agree with the above note. Suspect HCAP, remained febrile overnight.  Abx broadened to vancomycin/ceftazidime/fluconazole.   Volume status ok, off Lasix for now, creatinine improved.   Trach collar trial again today.    Will add free water boluses for hypernatremia.   Loralie Champagne 09/19/2017 7:59 AM

## 2017-09-19 NOTE — Progress Notes (Signed)
PULMONARY / CRITICAL CARE MEDICINE   Name: Clinton Harrington MRN: [id number] DOB: [birth year]    ADMISSION DATE:  08/23/2017 CONSULTATION DATE:  08/30/2017  REFERRING MD:  Cornelius Moras  CHIEF COMPLAINT:  Acute respiratory failure with hypoxemia  BRIEF:  66 y/o male with CAD who had an MI on 9/15, had severe RV failure, was treated with CABG, IABP, eventual centrimag device placed.  Post operatively was weaned off of the RVAD device on 10/1, remains mechanically ventilated.  Currently being treated for LLL HCAP. Completed 12 abx.  trached 10/10. 10/11 spiked fever and pan cultured  SUBJECTIVE:  On t collar but very weak  VITAL SIGNS: BP (!) 105/57   Pulse (!) 101   Temp (!) 102 F (38.9 C)   Resp (!) 32   Ht  (1.803 m)   Wt 175 lb 14.8 oz (79.8 kg)   SpO2 93%   BMI 24.54 kg/m   HEMODYNAMICS: CVP:  [4 mmHg-8 mmHg] 8 mmHg  VENTILATOR SETTINGS: Vent Mode: PRVC FiO2 (%):  [40 %] 40 % Set Rate:  [16 bmp] 16 bmp Vt Set:  [600 mL] 600 mL PEEP:  [5 cmH20] 5 cmH20 Plateau Pressure:  [15 cmH20-23 cmH20] 19 cmH20  INTAKE / OUTPUT: I/O last 3 completed shifts: In: 3497.7 [I.V.:1239; NG/GT:1491.8; IV Piggyback:767] Out: 3350 [Urine:3150; Emesis/NG output:200]  PHYSICAL EXAMINATION: General:  Ill/weak appearing male. Trach unremarkable, currently on T collar HEENT: Trach CDI NWG:NFAO affect Neuro: follows commands weakly CV: s1s2 rrr, no m/r/g nsr 96 PULM: shallow respirations with poor excursion ZH:YQMV, non-tender, bsx4 active , tf Extremities: warm/dry,- edema  Skin: no rashes or lesions      LABS:  BMET  Recent Labs Lab 09/17/17 0550  09/18/17 0438 09/18/17 1707 09/19/17 0358  NA 149*  < > 148* 157* 152*  K 4.5  < > 4.0 3.7 3.8  CL 110  < > 114* 116* 117*  CO2 29  --  28  --  26  BUN 60*  < > 61* 48* 50*  CREATININE 1.81*  < > 1.75* 1.40* 1.35*  GLUCOSE 121*  < > 230* 160* 205*  < > = values in this interval not displayed.  Electrolytes  Recent  Labs Lab 09/17/17 0550  09/18/17 0438 09/18/17 1900 09/19/17 0358  CALCIUM 8.2*  --  7.9*  --  8.1*  MG 2.6*  < > 2.8* 3.0* 2.9*  PHOS 4.8*  < > 3.1 2.6 2.6  < > = values in this interval not displayed.  CBC  Recent Labs Lab 09/17/17 0550  09/18/17 0438 09/18/17 1707 09/19/17 0358  WBC 9.8  --  7.7  --  8.3  HGB 9.0*  < > 7.8* 8.8* 8.4*  HCT 29.7*  < > 26.2* 26.0* 28.9*  PLT 352  --  278  --  251  < > = values in this interval not displayed.  Coag's  Recent Labs Lab 09/17/17 0550 09/18/17 0438 09/18/17 0636 09/19/17 0358  APTT 31  --   --   --   INR 1.64 2.71 2.71 1.72   Sepsis Markers  Recent Labs Lab 09/18/17 1036 09/19/17 0358  PROCALCITON 0.35 0.34    ABG  Recent Labs Lab 09/16/17 0015 09/17/17 0245 09/17/17 1435  PHART 7.536* 7.493* 7.475*  PCO2ART 36.3 37.7 38.2  PO2ART 55.1* 116* 306*   Liver Enzymes  Recent Labs Lab 09/17/17 0550 09/18/17 0438 09/19/17 0358  AST 44* 40 62*  ALT 31 27 33  ALKPHOS  75 68 73  BILITOT 0.7 0.8 0.8  ALBUMIN 2.3* 2.7* 2.6*   Cardiac Enzymes No results for input(s): TROPONINI, PROBNP in the last 168 hours.  Glucose  Recent Labs Lab 09/18/17 1256 09/18/17 1552 09/18/17 1946 09/18/17 2331 09/19/17 0420 09/19/17 0747  GLUCAP 217* 183* 214* 217* 187* 222*   Imaging Dg Chest Port 1 View  Result Date: 09/19/2017 CLINICAL DATA:  Respiratory failure EXAM: PORTABLE CHEST 1 VIEW COMPARISON:  09/18/2017 FINDINGS: Tracheostomy tube, right central line and feeding tube remain in place, unchanged. Prior CABG. Cardiomegaly. Improving left basilar atelectasis or infiltrate. No confluent opacity on the right. Small left pleural effusion. IMPRESSION: Improving left basilar aeration with decreasing atelectasis or infiltrate. Small left effusion. Electronically Signed   By: Charlett Nose M.D.   On: 09/19/2017 07:22     CULTURES: 9/22 sputum>>>ng 9/24>> Sputum>>ng 9/24 Blood>>NEG  9/24 Urine>>ng 9/28 BC x 2  >coag negative staph 1/4 10/2>> respiratory flora, candida albicans 10/11 bc x 2>> 10/11 Sputum>> 10/11 UC>>neg  ANTIBIOTICS: zinacef >>>9/22- 9/24 ceftaz 9/22>>>9/28 Vanc 9/20>> off Zosyn 9/28 >>10/11 10/11 fortaz>> 10/11 vabc>> 10/11 diflucan>>  SIGNIFICANT EVENTS: 08/23/2017 - admit 9/22 - intubated Cath cabg RV assist device 9/23 - On epi/levo/RVAD/vent.  CI 1.8 despite all interventions 9/24>> Epi, Levo, milrinine,amio,insulin gtt, Lasix gtt, Heparin, Fent, Versed 9/26 - Remains sedated on fent/ versed drips RVAD flows down to 3200 Good UO on lasix gtt @ 15 9/27 - 40% fio2, peep 8. RPM on RVAD turned down. Stil on levophed, milrinone, epi ggtt and heparin.admio gtt. Making urine. Has chest draines. Good urine outoput. Heparin on hold briefly before resumptions due to bleed in groin site. Getting 2 UI PRBC 9/30 mucous plugging requiring multiple lavages 10/1 RV assist device  removed  in the OR 2/2 bleeding 1-0/2 - Stable off RVAD, remains on levo, vaso,Epi, milrinone 10/8 self extubated 10/9 intubated 10/10 trach  STUDIES: 10/6 CT head > NAICP, may need MRI to assess further  Lines/tubes: 10/1 R subclavian CVL >  ETT out 10/8, self extubation 10/9 ETT>>10/10 Trach 10/10>>  DISCUSSION: 66 y/o male s/p MI, RV failure, CABG, centrimag RV support deviced (now weaned off) who remains intubated for respiratory failure.  Currently being treated for HCAP.  10/9 post 24 hr self extubation and may need intubation. Re intubated 10/9 and  trached 10/10. 10/11 on t collar. Will need to pursue LTAC placement. 10/11 placed on abx per CVTS.  ASSESSMENT / PLAN:  PULMONARY A: Acute respiratory failure with hypoxemia > improving HCAP > infiltrate worse on left Self extubated 10/8 0300 10/9 remains tenuous, requiring NTS for poor cough mechanics  10/9 re intubated for safety  10/10 trached 10/11 on t collar P:   Titrate O2 for sat of 88-92% Pulmonary toilet as  tolerated Mobilize as tolerated Note: He is on day 26 of hospitalization, extremely de conditioned, and was reintubated 10/9. He will need LTACH once trache stable 10/10 trached 10/11 on t collar and lasted 2 hours 10/12 push t collar as tolerated   CARDIOVASCULAR Lab Results  Component Value Date   INR 1.72 09/19/2017   INR 2.71 09/18/2017   INR 2.71 09/18/2017    A:  Cardiogenic shock > off pressors CAD, s/p MI and CABG RV failure, off assist devices PAF P:  arrhythmia meds per cardiology TCTS Tele Heparin/coumadin per pharmacy per pharm, note inr 10/12  RENAL Lab Results  Component Value Date   CREATININE 1.35 (H) 09/19/2017   CREATININE 1.40 (H)  09/18/2017   CREATININE 1.75 (H) 09/18/2017    Recent Labs Lab 09/18/17 0438 09/18/17 1707 09/19/17 0358  K 4.0 3.7 3.8     A:   AKI>  stable P:   Monitor BMET and UOP Replace electrolytes as needed  GASTROINTESTINAL A:   No acute issues P:   Continue tube feeding  HEMATOLOGIC  Recent Labs  09/18/17 1707 09/19/17 0358  HGB 8.8* 8.4*   Lab Results  Component Value Date   INR 1.72 09/19/2017   INR 2.71 09/18/2017   INR 2.71 09/18/2017    A:   No acute issues P:  Monitor for bleeding Heparin resumed 10/11 per pharm INR noted  INFECTIOUS A:   HCAP, fever on 10/6 Coag neg staph in blood> most likely contaminant 10/11 fever spike 102.4 P:   Abx per CVTS Consider dc rt [place] cvl  Follow culture data  ENDOCRINE CBG (last 3)   Recent Labs  09/18/17 2331 09/19/17 0420 09/19/17 0747  GLUCAP 217* 187* 222*     A:   DM2, persistent hyperglycemia   P:   Continue SSI Glargine10 qd. Increase to bid 10/11 Stopped steroids 10/11 Monitor closely on tube feeds  NEUROLOGIC A:   10/5 New left sided weakness> resolved  10/6 10/9 weak but follows commands 10/9 reintubation therefore back on sedation till trache and weaning resumes. Trached 10/10 and off sedation 10/12 very weak P:    Awake and alert , on t collar.  Remains very deconditioned   FAMILY  - Updates:  Patient updated bedside     Steve Minor ACNP [facility] Pager [phone] till 3 pm If no answer page [phone]

## 2017-09-19 NOTE — Progress Notes (Signed)
11 Days Post-Op Procedure(s) (LRB): REMOVAL OF R-VENTRICULAR ASSIST DEVICE WITH CARDIOPULMONARY BYPASS (N/A) TRANSESOPHAGEAL ECHOCARDIOGRAM (TEE) (N/A) Subjective: CABG with RV Infarct, IABP CABG with RVAD centrimag Postop trach for copd Postop afib  Now nsr w/ amio and coumadin Fever post trac cultures pending  Objective: Vital signs in last 24 hours: Temp:  [99.1 F (37.3 C)-102 F (38.9 C)] 102 F (38.9 C) (10/12 0728) Pulse Rate:  [65-103] 101 (10/12 0800) Cardiac Rhythm: Normal sinus rhythm (10/12 0430) Resp:  [18-33] 32 (10/12 0800) BP: (94-126)/(52-77) 105/57 (10/12 0800) SpO2:  [93 %-100 %] 93 % (10/12 0800) FiO2 (%):  [40 %] 40 % (10/12 0759) Weight:  [175 lb 14.8 oz (79.8 kg)] 175 lb 14.8 oz (79.8 kg) (10/12 0425)  Hemodynamic parameters for last 24 hours: CVP:  [4 mmHg-8 mmHg] 8 mmHg  Intake/Output from previous day: 10/11 0701 - 10/12 0700 In: 2203.9 [I.V.:418.9; NG/GT:1118; IV Piggyback:667] Out: 2250 [Urine:2050; Emesis/NG output:200] Intake/Output this shift: No intake/output data recorded.       Exam    General- alert and comfortable   Lungs- coarse with rales, wheezes- heavy secretions   Cor- regular rate and rhythm, no murmur , gallop   Abdomen- soft, non-tender   Extremities - warm, non-tender, minimal edema   Neuro- oriented, appropriate, no focal weakness   Lab Results:  Recent Labs  09/18/17 0438 09/18/17 1707 09/19/17 0358  WBC 7.7  --  8.3  HGB 7.8* 8.8* 8.4*  HCT 26.2* 26.0* 28.9*  PLT 278  --  251   BMET:  Recent Labs  09/18/17 0438 09/18/17 1707 09/19/17 0358  NA 148* 157* 152*  K 4.0 3.7 3.8  CL 114* 116* 117*  CO2 28  --  26  GLUCOSE 230* 160* 205*  BUN 61* 48* 50*  CREATININE 1.75* 1.40* 1.35*  CALCIUM 7.9*  --  8.1*    PT/INR:  Recent Labs  09/19/17 0358  LABPROT 20.0*  INR 1.72   ABG    Component Value Date/Time   PHART 7.475 (H) 09/17/2017 1435   HCO3 27.8 09/17/2017 1435   TCO2 28 09/18/2017 1707   ACIDBASEDEF 0.5 09/10/2017 0325   O2SAT 60.9 09/19/2017 0410   CBG (last 3)   Recent Labs  09/18/17 2331 09/19/17 0420 09/19/17 0747  GLUCAP 217* 187* 222*    Assessment/Plan: S/P Procedure(s) (LRB): REMOVAL OF R-VENTRICULAR ASSIST DEVICE WITH CARDIOPULMONARY BYPASS (N/A) TRANSESOPHAGEAL ECHOCARDIOGRAM (TEE) (N/A) Fever, normal WBC - prob HCAP Cont vanc, fortaz, diflucan Check cortisol level   LOS: 27 days    Kathlee Nations Trigt III 09/19/2017

## 2017-09-20 ENCOUNTER — Inpatient Hospital Stay (HOSPITAL_COMMUNITY): Payer: PPO

## 2017-09-20 LAB — GLUCOSE, CAPILLARY
GLUCOSE-CAPILLARY: 255 mg/dL — AB (ref 65–99)
GLUCOSE-CAPILLARY: 302 mg/dL — AB (ref 65–99)
Glucose-Capillary: 195 mg/dL — ABNORMAL HIGH (ref 65–99)
Glucose-Capillary: 152 mg/dL — ABNORMAL HIGH (ref 65–99)
Glucose-Capillary: 189 mg/dL — ABNORMAL HIGH (ref 65–99)
Glucose-Capillary: 246 mg/dL — ABNORMAL HIGH (ref 65–99)

## 2017-09-20 LAB — POCT I-STAT 3, ART BLOOD GAS (G3+)
ACID-BASE DEFICIT: 1 mmol/L (ref 0.0–2.0)
BICARBONATE: 22.8 mmol/L (ref 20.0–28.0)
O2 SAT: 100 %
TCO2: 24 mmol/L (ref 22–32)
pCO2 arterial: 36.8 mmHg (ref 32.0–48.0)
pH, Arterial: 7.41 (ref 7.350–7.450)
pO2, Arterial: 174 mmHg — ABNORMAL HIGH (ref 83.0–108.0)

## 2017-09-20 LAB — CULTURE, RESPIRATORY

## 2017-09-20 LAB — C DIFFICILE QUICK SCREEN W PCR REFLEX
C DIFFICILE (CDIFF) INTERP: NOT DETECTED
C Diff antigen: NEGATIVE
C Diff toxin: NEGATIVE

## 2017-09-20 LAB — PROTIME-INR
INR: 1.55
Prothrombin Time: 18.4 seconds — ABNORMAL HIGH (ref 11.4–15.2)

## 2017-09-20 LAB — BASIC METABOLIC PANEL
Anion gap: 6 (ref 5–15)
BUN: 51 mg/dL — ABNORMAL HIGH (ref 6–20)
CO2: 26 mmol/L (ref 22–32)
Calcium: 7.8 mg/dL — ABNORMAL LOW (ref 8.9–10.3)
Chloride: 121 mmol/L — ABNORMAL HIGH (ref 101–111)
Creatinine, Ser: 1.5 mg/dL — ABNORMAL HIGH (ref 0.61–1.24)
GFR calc Af Amer: 55 mL/min — ABNORMAL LOW (ref >60)
GFR calc non Af Amer: 48 mL/min — ABNORMAL LOW (ref >60)
Glucose, Bld: 216 mg/dL — ABNORMAL HIGH (ref 65–99)
Potassium: 4.2 mmol/L (ref 3.5–5.1)
Sodium: 153 mmol/L — ABNORMAL HIGH (ref 135–145)

## 2017-09-20 LAB — PROCALCITONIN: Procalcitonin: 0.3 ng/mL

## 2017-09-20 LAB — CBC
HCT: 27.4 % — ABNORMAL LOW (ref 39.0–52.0)
Hemoglobin: 7.9 g/dL — ABNORMAL LOW (ref 13.0–17.0)
MCH: 28.5 pg (ref 26.0–34.0)
MCHC: 28.8 g/dL — ABNORMAL LOW (ref 30.0–36.0)
MCV: 98.9 fL (ref 78.0–100.0)
Platelets: 234 10*3/uL (ref 150–400)
RBC: 2.77 MIL/uL — ABNORMAL LOW (ref 4.22–5.81)
RDW: 17 % — ABNORMAL HIGH (ref 11.5–15.5)
WBC: 8.9 10*3/uL (ref 4.0–10.5)

## 2017-09-20 LAB — CULTURE, RESPIRATORY W GRAM STAIN

## 2017-09-20 LAB — HEPARIN LEVEL (UNFRACTIONATED): Heparin Unfractionated: 0.44 IU/mL (ref 0.30–0.70)

## 2017-09-20 MED ORDER — SODIUM CHLORIDE 0.9 % IV SOLN
INTRAVENOUS | Status: AC
  Administered 2017-09-20: 09:00:00 via INTRAVENOUS

## 2017-09-20 MED ORDER — WARFARIN - PHARMACIST DOSING INPATIENT
Freq: Every day | Status: DC
  Administered 2017-09-20 – 2017-09-21 (×2)
  Administered 2017-09-22: 1
  Administered 2017-09-23 – 2017-09-26 (×4)

## 2017-09-20 MED ORDER — HEPARIN (PORCINE) IN NACL 100-0.45 UNIT/ML-% IJ SOLN
1300.0000 [IU]/h | INTRAMUSCULAR | Status: DC
  Administered 2017-09-20 – 2017-09-21 (×2): 1300 [IU]/h via INTRAVENOUS
  Filled 2017-09-20 (×2): qty 250

## 2017-09-20 MED ORDER — FENTANYL CITRATE (PF) 100 MCG/2ML IJ SOLN
50.0000 ug | INTRAMUSCULAR | Status: DC | PRN
Start: 2017-09-20 — End: 2017-09-25
  Administered 2017-09-22 (×2): 50 ug via INTRAVENOUS
  Administered 2017-09-23 – 2017-09-24 (×4): 100 ug via INTRAVENOUS
  Filled 2017-09-20 (×6): qty 2

## 2017-09-20 MED ORDER — DOPAMINE-DEXTROSE 3.2-5 MG/ML-% IV SOLN
2.5000 ug/kg/min | INTRAVENOUS | Status: DC
  Administered 2017-09-20: 5 ug/kg/min via INTRAVENOUS
  Administered 2017-09-21 – 2017-09-22 (×2): 4 ug/kg/min via INTRAVENOUS
  Filled 2017-09-20 (×2): qty 250

## 2017-09-20 MED ORDER — WARFARIN SODIUM 3 MG PO TABS
3.0000 mg | ORAL_TABLET | Freq: Once | ORAL | Status: AC
  Administered 2017-09-20: 3 mg via ORAL
  Filled 2017-09-20: qty 1

## 2017-09-20 NOTE — Progress Notes (Signed)
12 Days Post-Op Procedure(s) (LRB): REMOVAL OF R-VENTRICULAR ASSIST DEVICE WITH CARDIOPULMONARY BYPASS (N/A) TRANSESOPHAGEAL ECHOCARDIOGRAM (TEE) (N/A) Subjective: Sedated  Objective: Vital signs in last 24 hours: Temp:  [100.1 F (37.8 C)-102.4 F (39.1 C)] 101.4 F (38.6 C) (10/13 0727) Pulse Rate:  [67-106] 80 (10/13 0800) Cardiac Rhythm: Normal sinus rhythm (10/13 0800) Resp:  [21-42] 24 (10/13 0800) BP: (78-126)/(43-111) 88/47 (10/13 0800) SpO2:  [93 %-100 %] 98 % (10/13 0800) FiO2 (%):  [40 %] 40 % (10/13 0725) Weight:  [168 lb 3.2 oz (76.3 kg)] 168 lb 3.2 oz (76.3 kg) (10/13 0400)  Hemodynamic parameters for last 24 hours: CVP:  [3 mmHg-7 mmHg] 5 mmHg  Intake/Output from previous day: 10/12 0701 - 10/13 0700 In: 2550 [I.V.:450; NG/GT:1650; IV Piggyback:450] Out: 2060 [Urine:2060] Intake/Output this shift: Total I/O In: 100 [I.V.:20; Other:30; NG/GT:50] Out: -   General appearance: sedated Neurologic: generalized weakness Heart: regular rate and rhythm Lungs: coarse BS bilaterally Abdomen: normal findings: soft, non-tender  Lab Results:  Recent Labs  09/19/17 0358 09/20/17 0406  WBC 8.3 8.9  HGB 8.4* 7.9*  HCT 28.9* 27.4*  PLT 251 234   BMET:  Recent Labs  09/19/17 0358 09/20/17 0406  NA 152* 153*  K 3.8 4.2  CL 117* 121*  CO2 26 26  GLUCOSE 205* 216*  BUN 50* 51*  CREATININE 1.35* 1.50*  CALCIUM 8.1* 7.8*    PT/INR:  Recent Labs  09/20/17 0406  LABPROT 18.4*  INR 1.55   ABG    Component Value Date/Time   PHART 7.475 (H) 09/17/2017 1435   HCO3 27.8 09/17/2017 1435   TCO2 28 09/18/2017 1707   ACIDBASEDEF 0.5 09/10/2017 0325   O2SAT 60.9 09/19/2017 0410   CBG (last 3)   Recent Labs  09/19/17 2344 09/20/17 0350 09/20/17 0724  GLUCAP 185* 195* 255*    Assessment/Plan: S/P Procedure(s) (LRB): REMOVAL OF R-VENTRICULAR ASSIST DEVICE WITH CARDIOPULMONARY BYPASS (N/A) TRANSESOPHAGEAL ECHOCARDIOGRAM (TEE) (N/A) -  CV- BP  running relatively low- will give saline bolus this AM   Start dopamine if no improvement with saline  RESP- on vent via trach  No increase in O2 requirement or secretions  Unable to wean due to fevers  RENAL- creatinine up to 1.5  Hypernatremic  ENDO- CBG elevated  GI/Nutrition- on TF  Multiple loose stolls- wil check C diff due to unexplained fevers  ID- still febrile. He is on vanco, ceftazidime and diflucan  Will reculture   LOS: 28 days    Loreli Slot 09/20/2017

## 2017-09-20 NOTE — Progress Notes (Signed)
      [address]       [place] [address]             [phone]      Sleeping now, was more alert earlier  Temp back up to 103  C. Diff negative  BP (!) 109/56   Pulse (!) 110   Temp (!) 103.4 F (39.7 C) (Oral) Comment: Tylenol  Resp (!) 26   Ht  (1.803 m)   Wt 168 lb 3.2 oz (76.3 kg)   SpO2 98%   BMI 23.46 kg/m    Intake/Output Summary (Last 24 hours) at 09/20/17 1740 Last data filed at 09/20/17 1500  Gross per 24 hour  Intake           2821.8 ml  Output             1785 ml  Net           1036.8 ml   Remains on broad spectrum antibiotics  Viviann Spare C. Dorris Fetch, MD [facility] [phone]

## 2017-09-20 NOTE — Progress Notes (Signed)
ANTICOAGULATION CONSULT NOTE - Initial Consult  Pharmacy Consult for Coumadin (per discussion with Dr. Donata Clay 10/12, pharmacy to dose starting 10/13) Indication: afib, s/p DCCV 9/29  Allergies  Allergen Reactions  . Aspirin Other (See Comments)    Stomach upset  . Latex Rash  . Oxycodone-Acetaminophen Itching    Patient Measurements: Height:  (180.3 cm) Weight: 168 lb 3.2 oz (76.3 kg) IBW/kg (Calculated) : 75.3  Vital Signs: Temp: 101.4 F (38.6 C) (10/13 0727) Temp Source: Oral (10/13 0727) BP: 87/45 (10/13 1100) Pulse Rate: 97 (10/13 1100)  Labs:  Recent Labs  09/18/17 0438 09/18/17 0636 09/18/17 1707 09/19/17 0358 09/20/17 0406  HGB 7.8*  --  8.8* 8.4* 7.9*  HCT 26.2*  --  26.0* 28.9* 27.4*  PLT 278  --   --  251 234  LABPROT 28.6* 28.5*  --  20.0* 18.4*  INR 2.71 2.71  --  1.72 1.55  HEPARINUNFRC 0.13*  --   --   --   --   CREATININE 1.75*  --  1.40* 1.35* 1.50*    Estimated Creatinine Clearance: 53.7 mL/min (A) (by C-G formula based on SCr of 1.5 mg/dL (H)).   Assessment: 66 yo male on Coumadin for atrial fibrillation.  Pharmacy asked to dose starting 10/13, TCTS dosing until now.  INR falling, suspect INR of 2.7 on 10/11 was erroneous?  No over bleeding or complications noted, Hgb relatively stable.  Spoke to Dr. Shirlee Latch, since INR now < 2, will restart IV heparin.  Previously therapeutic on heparin at 1300 units/hr.  Hgb low, but no overt bleeding or complications noted.  Goal of Therapy:  INR 2-3 Monitor platelets by anticoagulation protocol: Yes   Plan:  1. Coumadin 3 mg x 1 tonight. 2. Daily PT/INR. 3. Restart IV heparin at 1300 units/hr.   4. Check heparin level in 6 hrs. 5. Daily heparin level and CBC.  Tad Moore, BCPS  Clinical Pharmacist Pager [phone]  09/20/2017 11:30 AM

## 2017-09-20 NOTE — Progress Notes (Signed)
ANTICOAGULATION CONSULT NOTE  Pharmacy Consult for heparin Indication: afib, s/p DCCV 9/29  Allergies  Allergen Reactions  . Aspirin Other (See Comments)    Stomach upset  . Latex Rash  . Oxycodone-Acetaminophen Itching    Patient Measurements: Height:  (180.3 cm) Weight: 168 lb 3.2 oz (76.3 kg) IBW/kg (Calculated) : 75.3  Vital Signs: Temp: 99.3 F (37.4 C) (10/13 1900) Temp Source: Oral (10/13 1900) BP: 110/58 (10/13 1900) Pulse Rate: 104 (10/13 1900)  Labs:  Recent Labs  09/18/17 0438 09/18/17 0636 09/18/17 1707 09/19/17 0358 09/20/17 0406 09/20/17 1822  HGB 7.8*  --  8.8* 8.4* 7.9*  --   HCT 26.2*  --  26.0* 28.9* 27.4*  --   PLT 278  --   --  251 234  --   LABPROT 28.6* 28.5*  --  20.0* 18.4*  --   INR 2.71 2.71  --  1.72 1.55  --   HEPARINUNFRC 0.13*  --   --   --   --  0.44  CREATININE 1.75*  --  1.40* 1.35* 1.50*  --     Estimated Creatinine Clearance: 53.7 mL/min (A) (by C-G formula based on SCr of 1.5 mg/dL (H)).   Assessment: 66 yo male continues on IV heparin due to low INR. Initial heparin level is therapeutic at 0.44. No bleeding noted.   Goal of Therapy:  Heparin level 0.3-0.7 units/ml Monitor platelets by anticoagulation protocol: Yes   Plan:  Continue heparin gtt 1300 units/hr Daily heparin level and CBC  Lysle Pearl, PharmD, BCPS 09/20/2017 7:26 PM

## 2017-09-21 ENCOUNTER — Inpatient Hospital Stay (HOSPITAL_COMMUNITY): Payer: PPO

## 2017-09-21 LAB — CBC
HCT: 26.4 % — ABNORMAL LOW (ref 39.0–52.0)
Hemoglobin: 7.7 g/dL — ABNORMAL LOW (ref 13.0–17.0)
MCH: 28.8 pg (ref 26.0–34.0)
MCHC: 29.2 g/dL — ABNORMAL LOW (ref 30.0–36.0)
MCV: 98.9 fL (ref 78.0–100.0)
Platelets: 223 10*3/uL (ref 150–400)
RBC: 2.67 MIL/uL — ABNORMAL LOW (ref 4.22–5.81)
RDW: 17 % — ABNORMAL HIGH (ref 11.5–15.5)
WBC: 10.9 10*3/uL — ABNORMAL HIGH (ref 4.0–10.5)

## 2017-09-21 LAB — BASIC METABOLIC PANEL
Anion gap: 4 — ABNORMAL LOW (ref 5–15)
BUN: 55 mg/dL — ABNORMAL HIGH (ref 6–20)
CO2: 25 mmol/L (ref 22–32)
Calcium: 7.4 mg/dL — ABNORMAL LOW (ref 8.9–10.3)
Chloride: 122 mmol/L — ABNORMAL HIGH (ref 101–111)
Creatinine, Ser: 1.52 mg/dL — ABNORMAL HIGH (ref 0.61–1.24)
GFR calc Af Amer: 55 mL/min — ABNORMAL LOW (ref >60)
GFR calc non Af Amer: 47 mL/min — ABNORMAL LOW (ref >60)
Glucose, Bld: 314 mg/dL — ABNORMAL HIGH (ref 65–99)
Potassium: 4 mmol/L (ref 3.5–5.1)
Sodium: 151 mmol/L — ABNORMAL HIGH (ref 135–145)

## 2017-09-21 LAB — PROTIME-INR
INR: 1.63
Prothrombin Time: 19.2 seconds — ABNORMAL HIGH (ref 11.4–15.2)

## 2017-09-21 LAB — VANCOMYCIN, TROUGH: Vancomycin Tr: 22 ug/mL (ref 15–20)

## 2017-09-21 LAB — HEPARIN LEVEL (UNFRACTIONATED)
Heparin Unfractionated: 0.74 IU/mL — ABNORMAL HIGH (ref 0.30–0.70)
Heparin Unfractionated: 0.63 IU/mL (ref 0.30–0.70)

## 2017-09-21 LAB — GLUCOSE, CAPILLARY
GLUCOSE-CAPILLARY: 228 mg/dL — AB (ref 65–99)
GLUCOSE-CAPILLARY: 259 mg/dL — AB (ref 65–99)
Glucose-Capillary: 253 mg/dL — ABNORMAL HIGH (ref 65–99)
Glucose-Capillary: 153 mg/dL — ABNORMAL HIGH (ref 65–99)
Glucose-Capillary: 185 mg/dL — ABNORMAL HIGH (ref 65–99)

## 2017-09-21 LAB — PREPARE RBC (CROSSMATCH)

## 2017-09-21 LAB — PROCALCITONIN: PROCALCITONIN: 0.23 ng/mL

## 2017-09-21 MED ORDER — WARFARIN SODIUM 3 MG PO TABS
3.0000 mg | ORAL_TABLET | Freq: Once | ORAL | Status: AC
  Administered 2017-09-21: 3 mg via ORAL
  Filled 2017-09-21: qty 1

## 2017-09-21 MED ORDER — HEPARIN (PORCINE) IN NACL 100-0.45 UNIT/ML-% IJ SOLN
1150.0000 [IU]/h | INTRAMUSCULAR | Status: DC
  Administered 2017-09-21 – 2017-09-22 (×2): 1200 [IU]/h via INTRAVENOUS
  Filled 2017-09-21: qty 250

## 2017-09-21 MED ORDER — NOREPINEPHRINE BITARTRATE 1 MG/ML IV SOLN
0.0000 ug/min | INTRAVENOUS | Status: DC
  Administered 2017-09-21: 2 ug/min via INTRAVENOUS
  Filled 2017-09-21 (×2): qty 4

## 2017-09-21 MED ORDER — VANCOMYCIN HCL 500 MG IV SOLR
500.0000 mg | Freq: Two times a day (BID) | INTRAVENOUS | Status: DC
  Administered 2017-09-21 – 2017-09-24 (×6): 500 mg via INTRAVENOUS
  Filled 2017-09-21 (×6): qty 500

## 2017-09-21 MED ORDER — SODIUM CHLORIDE 0.9 % IV SOLN
Freq: Once | INTRAVENOUS | Status: AC
  Administered 2017-09-21: 16:00:00 via INTRAVENOUS

## 2017-09-21 MED ORDER — INSULIN GLARGINE 100 UNIT/ML ~~LOC~~ SOLN
25.0000 [IU] | Freq: Two times a day (BID) | SUBCUTANEOUS | Status: DC
  Administered 2017-09-21 (×2): 25 [IU] via SUBCUTANEOUS
  Filled 2017-09-21 (×3): qty 0.25

## 2017-09-21 NOTE — Progress Notes (Signed)
Pharmacy Antibiotic Note  Clinton Harrington is a 66 y.o. male admitted on 08/23/2017 with fevers.  Pharmacy has been consulted for vancomycin dosing.  Also on ceftazidime.  Fluconazole stopped yesterday.  Cultures all negative.  Plan: Continue Vancomycin 750 mg IV q 12 hrs for now, will check trough level this afternoon. Continue ceftazidime 1g IV q 8 hrs. F/u cultures, renal function and clinical course.  Height: _0  (180.3 cm) Weight: 179 lb (81.2 kg) IBW/kg (Calculated) : 75.3  Temp (24hrs), Avg:100.9 F (38.3 C), Min:99.3 F (37.4 C), Max:103.4 F (39.7 C)   Recent Labs Lab 09/17/17 0550  09/18/17 0438 09/18/17 1707 09/19/17 0358 09/20/17 0406 09/21/17 0354  WBC 9.8  --  7.7  --  8.3 8.9 10.9*  CREATININE 1.81*  < > 1.75* 1.40* 1.35* 1.50* 1.52*  < > = values in this interval not displayed.  Estimated Creatinine Clearance: 53 mL/min (A) (by C-G formula based on SCr of 1.52 mg/dL (H)).    Allergies  Allergen Reactions  . Aspirin Other (See Comments)    Stomach upset  . Latex Rash  . Oxycodone-Acetaminophen Itching    Antimicrobials this admission:  10/11 Ceftaz >> 9/20 Vancomycin >> 10/5; 10/11 >> 9/20 Cefuroxime >> 9/22 9/22 Ceftazidime >>9/28 9/28 Zosyn>> 10/11 10/3 Fluconazole >> 10/8; 10/11 >>10/13   Dose adjustments this admission: 9/23 VT 16>>continue 1g q12 9/26 VT 24>>1500 q24 9/29 VT 18>>1500 q24 10/1 *extra 1250 mg given in OR* 10/5 VT 24 > 1250 q24h - d/c'd 10/14 VT =    Microbiology results: 10/13 C.diff - neg 10/13 BCx x 2: 10/11 BCx: px 10/11 UCx: neg 10/11 TA: rare coag neg staph 10/7 Resp Cx: few yeast 10/3 BCx: neg 10/2 BAL: ng 10/2 TA: ng 10/1 Sternal wound Cx: ng 9/28 bld x2: 1o2 MR-CoNS 9/24 bld x2: ngF 9/24 UCx: ng 9/24 Sputum: ng 9/20 and 9/22 Sputum: neg  Thank you for allowing pharmacy to be a part of this patient's care.  Uvaldo Rising, BCPS  Clinical Pharmacist Pager [phone]   09/21/2017 8:51 AM

## 2017-09-21 NOTE — Progress Notes (Signed)
ANTICOAGULATION CONSULT NOTE  Pharmacy Consult for heparin Indication: afib, s/p DCCV 9/29  Allergies  Allergen Reactions  . Aspirin Other (See Comments)    Stomach upset  . Latex Rash  . Oxycodone-Acetaminophen Itching    Patient Measurements: Height:  (180.3 cm) Weight: 179 lb (81.2 kg) IBW/kg (Calculated) : 75.3  Vital Signs: Temp: 101.6 F (38.7 C) (10/14 1636) Temp Source: Oral (10/14 1636) BP: 82/53 (10/14 1524) Pulse Rate: 108 (10/14 1524)  Labs:  Recent Labs  09/19/17 0358 09/20/17 0406 09/20/17 1822 09/21/17 0354 09/21/17 0737 09/21/17 1530  HGB 8.4* 7.9*  --  7.7*  --   --   HCT 28.9* 27.4*  --  26.4*  --   --   PLT 251 234  --  223  --   --   LABPROT 20.0* 18.4*  --  19.2*  --   --   INR 1.72 1.55  --  1.63  --   --   HEPARINUNFRC  --   --  0.44  --  0.74* 0.63  CREATININE 1.35* 1.50*  --  1.52*  --   --     Estimated Creatinine Clearance: 53 mL/min (A) (by C-G formula based on SCr of 1.52 mg/dL (H)).   Assessment: 66 yo male continues on IV heparin/warfarin for afib due to low INR. Heparin level is therapeutic at 0.63 after rate decrease. CBC stable. No bleeding noted.  INR slowly rising on Coumadin.    Goal of Therapy:  Heparin level 0.3-0.7 units/ml Monitor platelets by anticoagulation protocol: Yes   Plan:  Continue IV heparin at 1200 units/hr. Confirm heparin level therapeutic with AM labs Daily heparin level, CBC and INR Monitor for s/sx bleeding  Babs Bertin, PharmD, BCPS Clinical Pharmacist 09/21/2017 4:48 PM

## 2017-09-21 NOTE — Progress Notes (Signed)
Patient SBP continues to stay in 80s and MAPs 50s-60s despite increasing dopamine gtt to 6. Called MD and orders received for 1 unit pRBCs and Levophed gtt. Will continue to monitor patient closely.

## 2017-09-21 NOTE — Progress Notes (Signed)
ANTICOAGULATION CONSULT NOTE  Pharmacy Consult for heparin/Coumadin Indication: afib, s/p DCCV 9/29  Allergies  Allergen Reactions  . Aspirin Other (See Comments)    Stomach upset  . Latex Rash  . Oxycodone-Acetaminophen Itching    Patient Measurements: Height:  (180.3 cm) Weight: 179 lb (81.2 kg) IBW/kg (Calculated) : 75.3  Vital Signs: Temp: 99.8 F (37.7 C) (10/14 0739) Temp Source: Oral (10/14 0739) BP: 92/56 (10/14 0717) Pulse Rate: 103 (10/14 0717)  Labs:  Recent Labs  09/19/17 0358 09/20/17 0406 09/20/17 1822 09/21/17 0354 09/21/17 0737  HGB 8.4* 7.9*  --  7.7*  --   HCT 28.9* 27.4*  --  26.4*  --   PLT 251 234  --  223  --   LABPROT 20.0* 18.4*  --  19.2*  --   INR 1.72 1.55  --  1.63  --   HEPARINUNFRC  --   --  0.44  --  0.74*  CREATININE 1.35* 1.50*  --  1.52*  --     Estimated Creatinine Clearance: 53 mL/min (A) (by C-G formula based on SCr of 1.52 mg/dL (H)).   Assessment: 66 yo male continues on IV heparin due to low INR. Initial heparin level is therapeutic at 0.44. No bleeding noted. Heparin level up to 0.74 this AM.  Confirmed with RN, level drawn from opposite arm from where heparin infusing.  INR slowly rising on Coumadin.    Goal of Therapy:  Heparin level 0.3-0.7 units/ml Monitor platelets by anticoagulation protocol: Yes   Plan:  Decrease IV heparin to 1200 units/hr. Coumadin 3 mg x 1 again tonight. Daily heparin level, CBC and INR.  Tad Moore, BCPS  Clinical Pharmacist Pager [phone]  09/21/2017 8:43 AM

## 2017-09-21 NOTE — Progress Notes (Signed)
Pharmacy Antibiotic Note  Clinton Harrington is a 66 y.o. male admitted on 08/23/2017 with fevers.  Pharmacy has been consulted for vancomycin dosing.  Also on ceftazidime.  Fluconazole stopped yesterday.  Cultures all negative. SCr has been variable - currently stable at 1.52, CrCl~53.  Vancomycin trough this afternoon is slightly supratherapeutic at 22, drawn ~56mn early.  Plan: Adjust vancomycin to 5078mIV q12h Continue ceftazidime 1g IV q 8 hrs. F/u cultures, renal function, and clinical course Repeat vancomycin trough at new Css  Height: _0  (180.3 cm) Weight: 179 lb (81.2 kg) IBW/kg (Calculated) : 75.3  Temp (24hrs), Avg:100.5 F (38.1 C), Min:98.9 F (37.2 C), Max:102.9 F (39.4 C)   Recent Labs Lab 09/17/17 0550  09/18/17 0438 09/18/17 1707 09/19/17 0358 09/20/17 0406 09/21/17 0354 09/21/17 1530  WBC 9.8  --  7.7  --  8.3 8.9 10.9*  --   CREATININE 1.81*  < > 1.75* 1.40* 1.35* 1.50* 1.52*  --   VANCOTROUGH  --   --   --   --   --   --   --  22*  < > = values in this interval not displayed.  Estimated Creatinine Clearance: 53 mL/min (A) (by C-G formula based on SCr of 1.52 mg/dL (H)).    Allergies  Allergen Reactions  . Aspirin Other (See Comments)    Stomach upset  . Latex Rash  . Oxycodone-Acetaminophen Itching    Antimicrobials this admission:  10/11 Ceftaz >> 9/20 Vancomycin >> 10/5; 10/11 >> 9/20 Cefuroxime >> 9/22 9/22 Ceftazidime >>9/28 9/28 Zosyn>> 10/11 10/3 Fluconazole >> 10/8; 10/11 >>10/13   Dose adjustments this admission: 9/23 VT 16>>continue 1g q12 9/26 VT 24>>1500 q24 9/29 VT 18>>1500 q24 10/1 *extra 1250 mg given in OR* 10/5 VT 24 > 1250 q24h - d/c'd 10/14 VT = 22 on 75058mV q12h   Microbiology results: 10/13 C.diff - neg 10/13 BCx x 2: 10/11 BCx: px 10/11 UCx: neg 10/11 TA: rare coag neg staph 10/7 Resp Cx: few yeast 10/3 BCx: neg 10/2 BAL: ng 10/2 TA: ng 10/1 Sternal wound Cx: ng 9/28 bld x2: 1o2 MR-CoNS 9/24 bld  x2: ngF 9/24 UCx: ng 9/24 Sputum: ng 9/20 and 9/22 Sputum: neg  HalElicia LampharmD, BCPS Clinical Pharmacist 09/21/2017 4:51 PM'

## 2017-09-21 NOTE — Progress Notes (Signed)
      [address]       [place] [address]             [phone]      Febrile again this afternoon  BP a little soft earlier, dopamine increased to 6 and levophed started at 2  procalcitonin was 0.23  Cortisol= 16 2 days ago  Abdomen is completely benign- soft and nontender  Fevers with no obvious source persist  Viviann Spare C. Dorris Fetch, MD [facility] [phone]

## 2017-09-21 NOTE — Progress Notes (Signed)
CRITICAL VALUE ALERT  Critical Value:  Vancomycin trough 22  Date & Time Notied:  09/21/2017 1621  Provider Notified: Pharmacy  Orders Received/Actions taken: Per pharmacy, working on adjustments already

## 2017-09-21 NOTE — Clinical Social Work Note (Signed)
CSW continues to follow for discharge needs.  Gottlieb Zuercher, CSW [phone]  

## 2017-09-21 NOTE — Progress Notes (Signed)
13 Days Post-Op Procedure(s) (LRB): REMOVAL OF R-VENTRICULAR ASSIST DEVICE WITH CARDIOPULMONARY BYPASS (N/A) TRANSESOPHAGEAL ECHOCARDIOGRAM (TEE) (N/A) Subjective: More alert this AM. Denies pain  Objective: Vital signs in last 24 hours: Temp:  [99.3 F (37.4 C)-103.4 F (39.7 C)] 99.8 F (37.7 C) (10/14 0739) Pulse Rate:  [72-111] 103 (10/14 0717) Cardiac Rhythm: Sinus tachycardia (10/14 0600) Resp:  [14-35] 29 (10/14 0717) BP: (87-124)/(45-63) 92/56 (10/14 0717) SpO2:  [96 %-100 %] 98 % (10/14 0722) FiO2 (%):  [40 %] 40 % (10/14 0722) Weight:  [179 lb (81.2 kg)] 179 lb (81.2 kg) (10/13 2000)  Hemodynamic parameters for last 24 hours: CVP:  [7 mmHg-12 mmHg] 7 mmHg  Intake/Output from previous day: 10/13 0701 - 10/14 0700 In: 3793.4 [I.V.:1423.4; NG/GT:1890; IV Piggyback:450] Out: 2310 [Urine:1960; Stool:350] Intake/Output this shift: No intake/output data recorded.  General appearance: cooperative and no distress Neurologic: follows commands Heart: tachy, regular Lungs: clear to auscultation bilaterally Abdomen: normal findings: soft, non-tender  Lab Results:  Recent Labs  09/20/17 0406 09/21/17 0354  WBC 8.9 10.9*  HGB 7.9* 7.7*  HCT 27.4* 26.4*  PLT 234 223   BMET:  Recent Labs  09/20/17 0406 09/21/17 0354  NA 153* 151*  K 4.2 4.0  CL 121* 122*  CO2 26 25  GLUCOSE 216* 314*  BUN 51* 55*  CREATININE 1.50* 1.52*  CALCIUM 7.8* 7.4*    PT/INR:  Recent Labs  09/21/17 0354  LABPROT 19.2*  INR 1.63   ABG    Component Value Date/Time   PHART 7.410 09/20/2017 2220   HCO3 22.8 09/20/2017 2220   TCO2 24 09/20/2017 2220   ACIDBASEDEF 1.0 09/20/2017 2220   O2SAT 100.0 09/20/2017 2220   CBG (last 3)   Recent Labs  09/20/17 2310 09/21/17 0356 09/21/17 0734  GLUCAP 246* 253* 228*    Assessment/Plan: S/P Procedure(s) (LRB): REMOVAL OF R-VENTRICULAR ASSIST DEVICE WITH CARDIOPULMONARY BYPASS (N/A) TRANSESOPHAGEAL ECHOCARDIOGRAM (TEE) (N/A) -   CV- maintaining SR  RESP- VDRF. Weaning limited by fevers  minimal secretions  RENAL- creatinine stable, hypernatremia a little better  ENDO- CBG markedly elevated- will increase lantus  ID- Fever to 103 yesterday afternoon, WBC up slightly to 10.9  On vanco, ceftazidime and diflucan  No clear source for fevers at present time, c diff negative  Cultures pending  Nutrition- tolerating TF   LOS: 29 days    Loreli Slot 09/21/2017

## 2017-09-22 ENCOUNTER — Inpatient Hospital Stay (HOSPITAL_COMMUNITY): Payer: PPO

## 2017-09-22 DIAGNOSIS — A419 Sepsis, unspecified organism: Secondary | ICD-10-CM

## 2017-09-22 DIAGNOSIS — R6521 Severe sepsis with septic shock: Secondary | ICD-10-CM

## 2017-09-22 LAB — BPAM RBC
BLOOD PRODUCT EXPIRATION DATE: 201811052359
ISSUE DATE / TIME: 201810141658
UNIT TYPE AND RH: 5100

## 2017-09-22 LAB — CBC
HCT: 27.3 % — ABNORMAL LOW (ref 39.0–52.0)
HCT: 28 % — ABNORMAL LOW (ref 39.0–52.0)
Hemoglobin: 8.2 g/dL — ABNORMAL LOW (ref 13.0–17.0)
Hemoglobin: 8.5 g/dL — ABNORMAL LOW (ref 13.0–17.0)
MCH: 29.4 pg (ref 26.0–34.0)
MCH: 29.7 pg (ref 26.0–34.0)
MCHC: 30 g/dL (ref 30.0–36.0)
MCHC: 30.4 g/dL (ref 30.0–36.0)
MCV: 97.8 fL (ref 78.0–100.0)
MCV: 97.9 fL (ref 78.0–100.0)
Platelets: 202 10*3/uL (ref 150–400)
Platelets: 199 10*3/uL (ref 150–400)
RBC: 2.79 MIL/uL — ABNORMAL LOW (ref 4.22–5.81)
RBC: 2.86 MIL/uL — ABNORMAL LOW (ref 4.22–5.81)
RDW: 16.6 % — ABNORMAL HIGH (ref 11.5–15.5)
RDW: 16.6 % — ABNORMAL HIGH (ref 11.5–15.5)
WBC: 11.3 10*3/uL — ABNORMAL HIGH (ref 4.0–10.5)
WBC: 11.6 10*3/uL — ABNORMAL HIGH (ref 4.0–10.5)

## 2017-09-22 LAB — GLUCOSE, CAPILLARY
GLUCOSE-CAPILLARY: 160 mg/dL — AB (ref 65–99)
GLUCOSE-CAPILLARY: 142 mg/dL — AB (ref 65–99)
GLUCOSE-CAPILLARY: 168 mg/dL — AB (ref 65–99)
Glucose-Capillary: 275 mg/dL — ABNORMAL HIGH (ref 65–99)
Glucose-Capillary: 237 mg/dL — ABNORMAL HIGH (ref 65–99)
Glucose-Capillary: 237 mg/dL — ABNORMAL HIGH (ref 65–99)
Glucose-Capillary: 151 mg/dL — ABNORMAL HIGH (ref 65–99)

## 2017-09-22 LAB — COMPREHENSIVE METABOLIC PANEL
ALT: 80 U/L — ABNORMAL HIGH (ref 17–63)
ANION GAP: 7 (ref 5–15)
AST: 141 U/L — AB (ref 15–41)
Albumin: 2.1 g/dL — ABNORMAL LOW (ref 3.5–5.0)
Alkaline Phosphatase: 73 U/L (ref 38–126)
BILIRUBIN TOTAL: 0.7 mg/dL (ref 0.3–1.2)
BUN: 62 mg/dL — AB (ref 6–20)
CO2: 23 mmol/L (ref 22–32)
Calcium: 7 mg/dL — ABNORMAL LOW (ref 8.9–10.3)
Chloride: 119 mmol/L — ABNORMAL HIGH (ref 101–111)
Creatinine, Ser: 1.49 mg/dL — ABNORMAL HIGH (ref 0.61–1.24)
GFR calc Af Amer: 56 mL/min — ABNORMAL LOW (ref >60)
GFR, EST NON AFRICAN AMERICAN: 48 mL/min — AB (ref >60)
Glucose, Bld: 367 mg/dL — ABNORMAL HIGH (ref 65–99)
POTASSIUM: 3.6 mmol/L (ref 3.5–5.1)
Sodium: 149 mmol/L — ABNORMAL HIGH (ref 135–145)
TOTAL PROTEIN: 6.6 g/dL (ref 6.5–8.1)

## 2017-09-22 LAB — TYPE AND SCREEN
ABO/RH(D): O POS
Antibody Screen: NEGATIVE
Unit division: 0

## 2017-09-22 LAB — HEPARIN LEVEL (UNFRACTIONATED): Heparin Unfractionated: 0.67 IU/mL (ref 0.30–0.70)

## 2017-09-22 LAB — SEDIMENTATION RATE: Sed Rate: 107 mm/hr — ABNORMAL HIGH (ref 0–16)

## 2017-09-22 LAB — PROCALCITONIN: PROCALCITONIN: 0.22 ng/mL

## 2017-09-22 LAB — PROTIME-INR
INR: 1.76
Prothrombin Time: 20.3 seconds — ABNORMAL HIGH (ref 11.4–15.2)

## 2017-09-22 LAB — CORTISOL-AM, BLOOD: CORTISOL - AM: 18.4 ug/dL (ref 6.7–22.6)

## 2017-09-22 MED ORDER — IOPAMIDOL (ISOVUE-300) INJECTION 61%
INTRAVENOUS | Status: AC
  Administered 2017-09-22: 30 mL
  Filled 2017-09-22: qty 30

## 2017-09-22 MED ORDER — WARFARIN SODIUM 3 MG PO TABS
3.0000 mg | ORAL_TABLET | Freq: Once | ORAL | Status: AC
  Administered 2017-09-22: 3 mg via ORAL
  Filled 2017-09-22: qty 1

## 2017-09-22 MED ORDER — SODIUM CHLORIDE 0.9 % IV SOLN
1.0000 g | Freq: Three times a day (TID) | INTRAVENOUS | Status: DC
  Administered 2017-09-22 – 2017-09-26 (×12): 1 g via INTRAVENOUS
  Filled 2017-09-22 (×14): qty 1

## 2017-09-22 MED ORDER — INSULIN GLARGINE 100 UNIT/ML ~~LOC~~ SOLN
30.0000 [IU] | Freq: Two times a day (BID) | SUBCUTANEOUS | Status: DC
  Administered 2017-09-22 – 2017-09-26 (×9): 30 [IU] via SUBCUTANEOUS
  Filled 2017-09-22 (×10): qty 0.3

## 2017-09-22 MED ORDER — SODIUM CHLORIDE 0.9 % IV SOLN
100.0000 mg | Freq: Every day | INTRAVENOUS | Status: DC
  Administered 2017-09-22 – 2017-09-23 (×2): 100 mg via INTRAVENOUS
  Filled 2017-09-22 (×3): qty 100

## 2017-09-22 NOTE — Progress Notes (Signed)
Patient ID: Clinton Harrington, male   DOB: Apr 22, 1951, 66 y.o.   MRN: [id number]        Advanced Heart Failure Rounding Note   Subjective:    Events CABG x4 IABP Centrimag 9/20    IABP removed 9/24.  Centrimag RVAD in place DC-CV successful 9/29  Centrimag removal 10/1 Self-extubated 10/8 Re-intubated 10/9 S/P Tach 10/10 Recultured 10/11  Yesterday dopamine and norepi started for hypotension. Continues to have fevers, up to 103 yesterday afternoon. On vanc and ceftazidime. Bld CX - NGTD, overnight norepi stopped.   Follows commands. Denies pain.   ECHO 09/17/2017  Left ventricle: The cavity size was normal. There was moderate   concentric hypertrophy. Systolic function was normal. The   estimated ejection fraction was in the range of 60% to 65%.   Although no diagnostic regional wall motion abnormality was   identified, this possibility cannot be completely excluded on the   basis of this study. Doppler parameters are consistent with   abnormal left ventricular relaxation (grade 1 diastolic   dysfunction). Doppler parameters are consistent with   indeterminate mean left atrial filling pressure. - Ventricular septum: Septal motion showed paradox. - Aortic valve: There was mild stenosis. Valve area (VTI): 1.99   cm^2. Valve area (Vmax): 1.99 cm^2. Valve area (Vmean): 1.99   cm^2. - Right ventricle: Systolic function was moderately reduced. Impressions: - Compared to 09/05/2017, the right ventricle appears less dilated   and there is some improvement in RV function.   Objective:   Weight Range: 184 lb 8.4 oz (83.7 kg) Body mass index is 25.74 kg/m.   Vital Signs:   Temp:  [98.9 F (37.2 C)-103.1 F (39.5 C)] 99.7 F (37.6 C) (10/15 0400) Pulse Rate:  [96-110] 100 (10/15 0718) Resp:  [14-45] 27 (10/15 0718) BP: (79-128)/(46-74) 109/63 (10/15 0718) SpO2:  [89 %-100 %] 93 % (10/15 0718) FiO2 (%):  [30 %-40 %] 30 % (10/15 0718) Weight:  [184 lb 8.4 oz (83.7  kg)] 184 lb 8.4 oz (83.7 kg) (10/15 0402) Last BM Date: 09/21/17  Weight change: Filed Weights   09/20/17 0400 09/20/17 2000 09/22/17 0402  Weight: 168 lb 3.2 oz (76.3 kg) 179 lb (81.2 kg) 184 lb 8.4 oz (83.7 kg)    Intake/Output:   Intake/Output Summary (Last 24 hours) at 09/22/17 0721 Last data filed at 09/22/17 0600  Gross per 24 hour  Intake          3158.54 ml  Output             1435 ml  Net          1723.54 ml      Physical Exam  CVP 5  General:  No resp difficulty. In bed.  HEENT: normal except trach Neck: supple. no JVD. Carotids 2+ bilat; no bruits. No lymphadenopathy or thryomegaly appreciated. Cor: PMI nondisplaced. Regular rate & rhythm. No rubs, gallops or murmurs. Lungs: Rhonchi throughout.  Abdomen: soft, nontender, nondistended. No hepatosplenomegaly. No bruits or masses. Good bowel sounds. Extremities: no cyanosis, clubbing, rash, edema.RUE PICC Neuro: Trach follows commands. MAE GU: foley    Telemetry  SR-ST 90-100s personally reviewed.   Labs    CBC  Recent Labs  09/22/17 0100 09/22/17 0350  WBC 11.3* 11.6*  HGB 8.2* 8.5*  HCT 27.3* 28.0*  MCV 97.8 97.9  PLT 202 254   Basic Metabolic Panel  Recent Labs  09/21/17 0354 09/22/17 0350  NA 151* 149*  K 4.0 3.6  CL  122* 119*  CO2 25 23  GLUCOSE 314* 367*  BUN 55* 62*  CREATININE 1.52* 1.49*  CALCIUM 7.4* 7.0*   Liver Function Tests  Recent Labs  09/22/17 0350  AST 141*  ALT 80*  ALKPHOS 73  BILITOT 0.7  PROT 6.6  ALBUMIN 2.1*   No results for input(s): LIPASE, AMYLASE in the last 72 hours. Cardiac Enzymes No results for input(s): CKTOTAL, CKMB, CKMBINDEX, TROPONINI in the last 72 hours.  BNP: BNP (last 3 results)  Recent Labs  08/23/17 1927  BNP 200.0*    ProBNP (last 3 results) No results for input(s): PROBNP in the last 8760 hours.   D-Dimer No results for input(s): DDIMER in the last 72 hours. Hemoglobin A1C No results for input(s): HGBA1C in the last 72  hours. Fasting Lipid Panel No results for input(s): CHOL, HDL, LDLCALC, TRIG, CHOLHDL, LDLDIRECT in the last 72 hours. Thyroid Function Tests No results for input(s): TSH, T4TOTAL, T3FREE, THYROIDAB in the last 72 hours.  Invalid input(s): FREET3  Other results:   Imaging    No results found.   Medications:     Scheduled Medications: . amiodarone  200 mg Per Tube BID  . aspirin EC  81 mg Oral Daily   Or  . aspirin  81 mg Per Tube Daily  . atorvastatin  80 mg Per Tube q1800  . brimonidine  1 drop Right Eye BID  . budesonide (PULMICORT) nebulizer solution  0.25 mg Nebulization BID  . chlorhexidine gluconate (MEDLINE KIT)  15 mL Mouth Rinse BID  . Chlorhexidine Gluconate Cloth  6 each Topical Daily  . collagenase   Topical Daily  . feeding supplement (PRO-STAT SUGAR FREE 64)  30 mL Per Tube TID  . free water  200 mL Per Tube Q8H  . insulin aspart  0-24 Units Subcutaneous Q4H  . insulin glargine  25 Units Subcutaneous BID  . levalbuterol  1.25 mg Nebulization Q6H  . mouth rinse  15 mL Mouth Rinse 10 times per day  . metoCLOPramide (REGLAN) injection  10 mg Intravenous Q6H  . mupirocin cream   Topical BID  . pantoprazole sodium  40 mg Per Tube Daily  . sodium chloride flush  10-40 mL Intracatheter Q12H  . sodium chloride flush  3 mL Intravenous Q12H  . spironolactone  12.5 mg Per Tube Daily  . warfarin  3 mg Oral ONCE-1800  . Warfarin - Pharmacist Dosing Inpatient   Does not apply q1800    Infusions: . sodium chloride    . sodium chloride 10 mL/hr at 09/22/17 0600  . cefTAZidime (FORTAZ)  IV Stopped (09/22/17 0558)  . DOPamine 3.984 mcg/kg/min (09/22/17 0600)  . feeding supplement (VITAL 1.5 CAL) 1,000 mL (09/22/17 0600)  . heparin 1,200 Units/hr (09/22/17 0600)  . norepinephrine (LEVOPHED) Adult infusion Stopped (09/22/17 9798)  . potassium chloride 10 mEq (09/22/17 9211)  . vancomycin Stopped (09/22/17 9417)    PRN Medications: acetaminophen (TYLENOL) oral  liquid 160 mg/5 mL, fentaNYL (SUBLIMAZE) injection, metoprolol tartrate, midazolam, midazolam, ondansetron (ZOFRAN) IV, oxyCODONE, potassium chloride, sodium chloride flush, sodium chloride flush    Patient Profile   Clinton Harrington is a 66 year old with a history of DMII, smoker, CAD S/P PCI RCA 20 years ago, HTN, PAD, and hyperlipidemia admitted with CP and inferior MI with RV infarction.  He underwent LHC with IABP. After optimized, underwent CABG x4 centrimag for RV infarct on 9/20 and continued IABP.   Assessment/Plan   1. Post-cardiotomy cardiogenic shock:  Primarily RV failure from inferior infarct with RV involvement.  Repeat echo on 9/24 showed vigorous LV systolic function with moderate RV dysfunction.  Now s/p CABG and Centrimag for RV support on 9/20. IABP removed 9/24 with good LV function.  Centrimag out 10/1.  ECHO 09/17/2017 EF 60-65%, grade I DD, RV with some improvement/less dilated.  - CVP 5. Volume status stable.  On dopamine 4 mcg. Continue to wean.  - Continue 12.5 mg spiro daily.  2. CAD: Inferior MI with RV involvement at presentation, now s/p CABG.   - Continue asa to 81 mg daily.  - Continue statin.  3. Atrial fibrillation/flutter: DCCV 9/29, now remains in NSR.  - Continue amiodarone per tube. Continue amio 200 mg twice a day,  - On warfarin and heparin. INR 1.76.   4. AKI: Creatinine trending down 1.75>1.3>1.4 .  5. Acute respiratory failure: Suspect left-sided HCAP. Self-extubated 09/15/17. Had resp distress overnight into 09/16/17, remained weak requiring re-intubation.  S/P Trach 09/17/2017. On 40%.  - Trach collar trial again today.  - Abx have been broadened to vancomycin/ceftazidime 6. Neuro: ? L-sided weakness now resolved.  Head CT 10/5 negative. No change.  7. ID: Coag negative Staph on 9/28 blood culture, possible HCAP versus contaminant. WBC up to 11.6.  Re-cultured on 10/11.  Cultures remains negative but still spiking fevers.  Suspect hypotension is  septic/vasogenic shock.  ?source = HCAP.  Has pressure ulcer that could be source as well.  - Now on vanc and fortaz.   8. Anemia: Received 2 uPRBCs 09/04/17. 4 units PRBCs on 9/29. Received 2U PRBCs 10/1.  Hgb 10.2 -> 9.0-> 7.8->8.4->8.5 .  He received IV Fe on 10/11.   10. Unstageable Pressure Ulcer - R Buttock partial thickness. Left buttock with slough. Start santyl to left buttock for selective debridement.  - Continue reposition. On tube feeds. WOC to reassess today.  11. Diabetes: Better controlled with addition of lantus.  12. Deconditioning: PT consulted. CIR recommended.   13. Hypernatremia: Sodium 149. Continue free water      Darrick Grinder, NP  09/22/2017 7:21 AM   Advanced Heart Failure Team Pager [phone] (M-F; [place])  Please contact [facility] for night-coverage after hours (4p -7a ) and weekends on [email or web address]  Patient seen with NP, agree with the above note.    He remains in NSR today, continue amiodarone.  On heparin gtt while INR subtherapeutic.   He continues to spike fevers, ?source.  Possible left-sided HCAP, also has pressure ulcer.  PCT is not significantly elevated.  Hypotension yesterday, better today.  Was on norepinephrine/dopamine, now just on dopamine at 4.  Suspect septic/vasogenic shock.  - Continue to titrate down on dopamine as pressure tolerates.  - Hold diuresis, CVP 5.   Hypernatremia: Getting free water boluses.   Eventually will need CIR.   Loralie Champagne 09/22/2017 9:28 AM

## 2017-09-22 NOTE — Progress Notes (Addendum)
14 Days Post-Op Procedure(s) (LRB): REMOVAL OF R-VENTRICULAR ASSIST DEVICE WITH CARDIOPULMONARY BYPASS (N/A) TRANSESOPHAGEAL ECHOCARDIOGRAM (TEE) (N/A) Subjective: Stable on trach/vent Persistent fever after trach with min WBC and low procalcitonin, neg cultures but   heavy secretions Sodium high and cortisol level hi normal fortaz stopped for possible drug fever Stop amiodarone for poss drug fever Sternotomy dry with some escar formation- check CT chest, abdomen Sacral decub personally examined and fairly clean All central lines changed   Objective: Vital signs in last 24 hours: Temp:  [98.9 F (37.2 C)-103.1 F (39.5 C)] 99.7 F (37.6 C) (10/15 0400) Pulse Rate:  [96-110] 100 (10/15 0718) Cardiac Rhythm: Sinus tachycardia (10/15 0400) Resp:  [22-45] 27 (10/15 0718) BP: (79-128)/(46-74) 109/63 (10/15 0718) SpO2:  [89 %-100 %] 93 % (10/15 0718) FiO2 (%):  [30 %-40 %] 30 % (10/15 0718) Weight:  [184 lb 8.4 oz (83.7 kg)] 184 lb 8.4 oz (83.7 kg) (10/15 0402)  Hemodynamic parameters for last 24 hours: CVP:  [5 mmHg-15 mmHg] 5 mmHg  Intake/Output from previous day: 10/14 0701 - 10/15 0700 In: 3158.5 [I.V.:898.5; Blood:350; NG/GT:1310; IV Piggyback:350] Out: 0350 [Urine:1435] Intake/Output this shift: Total I/O In: -  Out: 175 [Urine:175]       Exam    General- alert and comfortable on vent with trach   Lungs- clear without rales, wheezes   Cor- regular rate and rhythm, no murmur , gallop   Abdomen- soft, non-tender   Extremities - warm, non-tender, minimal edema   Neuro- oriented, appropriate, no focal weakness   Lab Results:  Recent Labs  09/22/17 0100 09/22/17 0350  WBC 11.3* 11.6*  HGB 8.2* 8.5*  HCT 27.3* 28.0*  PLT 202 199   BMET:  Recent Labs  09/21/17 0354 09/22/17 0350  NA 151* 149*  K 4.0 3.6  CL 122* 119*  CO2 25 23  GLUCOSE 314* 367*  BUN 55* 62*  CREATININE 1.52* 1.49*  CALCIUM 7.4* 7.0*    PT/INR:  Recent Labs  09/22/17 0350   LABPROT 20.3*  INR 1.76   ABG    Component Value Date/Time   PHART 7.410 09/20/2017 2220   HCO3 22.8 09/20/2017 2220   TCO2 24 09/20/2017 2220   ACIDBASEDEF 1.0 09/20/2017 2220   O2SAT 100.0 09/20/2017 2220   CBG (last 3)   Recent Labs  09/21/17 2021 09/21/17 2331 09/22/17 0348  GLUCAP 185* 237* 275*    Assessment/Plan: S/P Procedure(s) (LRB): REMOVAL OF R-VENTRICULAR ASSIST DEVICE WITH CARDIOPULMONARY BYPASS (N/A) TRANSESOPHAGEAL ECHOCARDIOGRAM (TEE) (N/A) Mobilize Diabetes control CT chest, abd to eaval fever  Cont coumadin- stop heparin Check ESR May need ID eval depending on CT scan results   LOS: 30 days    Tharon Aquas Trigt III 09/22/2017

## 2017-09-22 NOTE — Progress Notes (Signed)
Removed patient foley cath at 0900 patient had not voided by 1500; bladder scan completed with in bladder, in and out cath done per policy and obtained of urine.  I&O cath observed by Lynetta Mare RN.  Will continue to monitor.

## 2017-09-22 NOTE — Progress Notes (Signed)
Pt transported to CT and back on ventilator with no complications. Pt stable throughout. VS within normal limits.

## 2017-09-22 NOTE — Progress Notes (Signed)
Patient ID: Clinton Harrington, male   DOB: 11/10/1951, 66 y.o.   MRN: [id number] EVENING ROUNDS NOTE :     [address]       [place] [address]             [phone]                 14 Days Post-Op Procedure(s) (LRB): REMOVAL OF R-VENTRICULAR ASSIST DEVICE WITH CARDIOPULMONARY BYPASS (N/A) TRANSESOPHAGEAL ECHOCARDIOGRAM (TEE) (N/A)  Total Length of Stay:  LOS: 30 days  BP 109/74   Pulse (!) 101   Temp 100.2 F (37.9 C) (Oral)   Resp (!) 31   Ht  (1.803 m)   Wt 184 lb 8.4 oz (83.7 kg)   SpO2 96%   BMI 25.74 kg/m   .Intake/Output      10/14 0701 - 10/15 0700 10/15 0701 - 10/16 0700   P.O. 0    I.V. (mL/kg) 904.2 (10.8) 33 (0.4)   Blood 350    Other 250    NG/GT 1360 400   IV Piggyback 350    Total Intake(mL/kg) 3214.2 (38.4) 433 (5.2)   Urine (mL/kg/hr) 1435 (0.7) 635 (0.7)   Total Output 1435 635   Net +1779.2 -202          . sodium chloride    . sodium chloride 10 mL/hr at 09/22/17 0600  . anidulafungin (ERAXIS) 100 mg IVPB    . DOPamine 2.5 mcg/kg/min (09/22/17 0924)  . feeding supplement (VITAL 1.5 CAL) 1,000 mL (09/22/17 1134)  . meropenem (MERREM) IV 1 g (09/22/17 1734)  . norepinephrine (LEVOPHED) Adult infusion 0 mcg/min (09/22/17 0924)  . potassium chloride 10 mEq (09/22/17 4132)  . vancomycin Stopped (09/22/17 4401)     Lab Results  Component Value Date   WBC 11.6 (H) 09/22/2017   HGB 8.5 (L) 09/22/2017   HCT 28.0 (L) 09/22/2017   PLT 199 09/22/2017   GLUCOSE 367 (H) 09/22/2017   CHOL 126 08/24/2017   TRIG 240 (H) 08/24/2017   HDL 31 (L) 08/24/2017   LDLDIRECT 128.6 10/30/2012   LDLCALC 47 08/24/2017   ALT 80 (H) 09/22/2017   AST 141 (H) 09/22/2017   NA 149 (H) 09/22/2017   K 3.6 09/22/2017   CL 119 (H) 09/22/2017   CREATININE 1.49 (H) 09/22/2017   BUN 62 (H) 09/22/2017   CO2 23 09/22/2017   TSH 0.722 08/25/2017   INR 1.76 09/22/2017   HGBA1C 5.9 (H) 08/25/2017   MICROALBUR 30.9 (H) 10/30/2012   Ct Abdomen  Pelvis Wo Contrast  Result Date: 09/22/2017 CLINICAL DATA:  Un resolving pneumonia. EXAM: CT CHEST, ABDOMEN AND PELVIS WITHOUT CONTRAST TECHNIQUE: Multidetector CT imaging of the chest, abdomen and pelvis was performed following the standard protocol without IV contrast. COMPARISON:  Chest radiograph of same day. CT scan of July 22, 2007. FINDINGS: CT CHEST FINDINGS Cardiovascular: Atherosclerosis of thoracic aorta is noted without aneurysm formation. Status post coronary artery bypass graft. Minimal pericardial effusion is noted. Mediastinum/Nodes: Tracheostomy tube is in grossly good position. Feeding tube is seen passing through esophagus. Thyroid gland is unremarkable. No mediastinal adenopathy is noted. Lungs/Pleura: Mild left pleural effusion is noted with adjacent subsegmental atelectasis. Multifocal airspace opacities are noted in the right lower lobe most consistent with pneumonia. Musculoskeletal: No chest wall mass or suspicious bone lesions identified. CT ABDOMEN PELVIS FINDINGS Hepatobiliary: No focal liver abnormality is seen. No gallstones, gallbladder wall thickening, or biliary dilatation. Pancreas: Unremarkable. No pancreatic  ductal dilatation or surrounding inflammatory changes. Spleen: Normal in size without focal abnormality. Adrenals/Urinary Tract: Adrenal glands and kidneys appear normal. No renal or ureteral calculi are noted. Vascular calcifications are noted in both kidneys. No hydronephrosis or renal obstruction is noted. Superior portion of urinary bladder is seen extending into moderate-sized left inguinal hernia. Stomach/Bowel: Rectal tube is present. Distal tip of feeding tube is seen in fourth portion of duodenum. There is no evidence of bowel obstruction or inflammation. The appendix appears normal. Vascular/Lymphatic: Aortic atherosclerosis. No enlarged abdominal or pelvic lymph nodes. Reproductive: Prostate is unremarkable. Other: Moderate size left inguinal hernia is noted  which contains a portion of the superior portion of urinary bladder. Musculoskeletal: No acute or significant osseous findings. IMPRESSION: Multifocal airspace opacities are noted in the right lower lobe concerning for multifocal pneumonia. Mild left pleural effusion is noted with adjacent subsegmental atelectasis. Tracheostomy and feeding tubes are in grossly good position. Minimal pericardial effusion. Aortic atherosclerosis. Moderate size left inguinal hernia is noted which contains a portion of the urinary bladder. Electronically Signed   By: Lupita Raider, M.D.   On: 09/22/2017 13:48   Based on scan antibiotics added to. Cr 1.49 More alert today   [name]  Beeper [phone] Office [phone] 09/22/2017 5:43 PM

## 2017-09-22 NOTE — Progress Notes (Signed)
Physical Therapy Treatment Patient Details Name: Clinton Harrington MRN: [id number] DOB: Oct 26, 1951 Today's Date: 09/22/2017    History of Present Illness Patient is a 66 y/o Caucasian M with hx CADs/p BMS to RCA in 1999 (due to an inferior STEMI), significant PVD, HTN, Type 2 DM (non-insulin dependent), HLP, Tobacco use comes in today complaining of substernal chest pain that started last night at 11 pm. He had associated SOB, nausea, diaphoresis. Pt underwent CABGx4 on 9/20. Pt then underwent removal of R VAD with cardiopulmonary bypass on 10/1. Pt self extubated on 10/8. reintubated 09/17/17    PT Comments    Pt remains very deconditioned and con't to require maxAx2. Pt requires maxA to maintain upright/midline position. Pt with increased RR into 40s during sitting EOB. Acute PT to con't to follow to progress mobility.   Follow Up Recommendations  CIR;Supervision/Assistance - 24 hour     Equipment Recommendations       Recommendations for Other Services Rehab consult     Precautions / Restrictions Precautions Precautions: Sternal Precaution Comments: pt educated on sternal precautions however unable to recall  Restrictions Weight Bearing Restrictions: Yes Other Position/Activity Restrictions: sternal precautions    Mobility  Bed Mobility Overal bed mobility: Needs Assistance Bed Mobility: Supine to Sit;Sit to Supine     Supine to sit: Max assist;+2 for physical assistance Sit to supine: Max assist;+2 for physical assistance   General bed mobility comments: pt with no initiation and minimal assist. pt required assist to bring LEs on/off bed, maxA for trunk elevation and control descent back to bed  Transfers                 General transfer comment: pt will need to be hoyer lifted OOB to chair at this time this is the safest way. RN aware and reports she will attempt this afternoon.  Ambulation/Gait             General Gait Details: currently  unable   Stairs            Wheelchair Mobility    Modified Rankin (Stroke Patients Only)       Balance Overall balance assessment: Needs assistance Sitting-balance support: Feet supported;Bilateral upper extremity supported Sitting balance-Leahy Scale: Poor Sitting balance - Comments: pt sat EOB x 10 min with maxA to maintain upright position. Pt's RR increased to 40 rrm. Pt unable to hold head up >2 seconds. pt with strong forward lean without protective responses                                    Cognition Arousal/Alertness: Awake/alert Behavior During Therapy: WFL for tasks assessed/performed Overall Cognitive Status: Impaired/Different from baseline Area of Impairment: Following commands;Problem solving                       Following Commands: Follows one step commands with increased time     Problem Solving: Slow processing;Decreased initiation;Difficulty sequencing;Requires verbal cues;Requires tactile cues General Comments: pt able to communicate with head nodding yes/no, followed commands majority of time, waved goodbye      Exercises      General Comments General comments (skin integrity, edema, etc.): increased RR during sitting EOB      Pertinent Vitals/Pain Pain Assessment: Faces Faces Pain Scale: Hurts even more Pain Location: L hip Pain Intervention(s): Monitored during session    Home Living  Prior Function            PT Goals (current goals can now be found in the care plan section) Progress towards PT goals: Not progressing toward goals - comment (pt con'ts to require maxA for all mobility)    Frequency    Min 3X/week      PT Plan Current plan remains appropriate    Co-evaluation              AM-PAC PT "6 Clicks" Daily Activity  Outcome Measure  Difficulty turning over in bed (including adjusting bedclothes, sheets and blankets)?: Unable Difficulty moving from  lying on back to sitting on the side of the bed? : Unable Difficulty sitting down on and standing up from a chair with arms (e.g., wheelchair, bedside commode, etc,.)?: Unable Help needed moving to and from a bed to chair (including a wheelchair)?: Total Help needed walking in hospital room?: Total Help needed climbing 3-5 steps with a railing? : Total 6 Click Score: 6    End of Session Equipment Utilized During Treatment:  (trach) Activity Tolerance: Patient tolerated treatment well Patient left: in bed;with call bell/phone within reach;with nursing/sitter in room Nurse Communication: Mobility status PT Visit Diagnosis: Unsteadiness on feet (R26.81);Muscle weakness (generalized) (M62.81);Difficulty in walking, not elsewhere classified (R26.2);Other abnormalities of gait and mobility (R26.89)     Time: 1610-9604 PT Time Calculation (min) (ACUTE ONLY): 27 min  Charges:  $Therapeutic Activity: 23-37 mins                    G Codes:       Lewis Shock, PT, DPT Pager #: [phone] Office #: [phone]    Mairen Wallenstein M Jaliana Medellin 09/22/2017, 2:19 PM

## 2017-09-22 NOTE — Progress Notes (Signed)
PULMONARY / CRITICAL CARE MEDICINE   Name: Clinton Harrington MRN: [id number] DOB: 08-Oct-1951    ADMISSION DATE:  08/23/2017 CONSULTATION DATE:  08/30/2017  REFERRING MD:  Cornelius Moras  CHIEF COMPLAINT:  Acute respiratory failure with hypoxemia  BRIEF:  66 y/o male with CAD who had an MI on 9/15, had severe RV failure, was treated with CABG, IABP, eventual centrimag device placed.  Post operatively was weaned off of the RVAD device on 10/1, remains mechanically ventilated.  Currently being treated for LLL HCAP. Completed 12 abx.  trached 10/10. 10/11 spiked fever and pan cultured  SUBJECTIVE:  Remains on pressors dopamine  VITAL SIGNS: BP (!) 88/61   Pulse (!) 105   Temp 99.7 F (37.6 C) (Oral)   Resp (!) 31   Ht  (1.803 m)   Wt 83.7 kg (184 lb 8.4 oz)   SpO2 96%   BMI 25.74 kg/m   HEMODYNAMICS: CVP:  [5 mmHg-15 mmHg] 12 mmHg  VENTILATOR SETTINGS: Vent Mode: PRVC FiO2 (%):  [30 %-40 %] 30 % Set Rate:  [16 bmp] 16 bmp Vt Set:  [600 mL] 600 mL PEEP:  [5 cmH20] 5 cmH20 Plateau Pressure:  [16 cmH20-22 cmH20] 22 cmH20  INTAKE / OUTPUT: I/O last 3 completed shifts: In: 4769.9 [I.V.:1379.9; Blood:350; Other:250; NG/GT:2190; IV Piggyback:600] Out: 2335 [Urine:2335]  PHYSICAL EXAMINATION: General: awaks opens eyes Neuro: perrl, follows commands,weak HEENT: trach clean PULM: coarse CV:  s1 s2 rrt GI: soft, bs low Extremities: edema    LABS:  BMET  Recent Labs Lab 09/20/17 0406 09/21/17 0354 09/22/17 0350  NA 153* 151* 149*  K 4.2 4.0 3.6  CL 121* 122* 119*  CO2 BUN 51* 55* 62*  CREATININE 1.50* 1.52* 1.49*  GLUCOSE 216* 314* 367*    Electrolytes  Recent Labs Lab 09/18/17 0438 09/18/17 1900 09/19/17 0358 09/20/17 0406 09/21/17 0354 09/22/17 0350  CALCIUM 7.9*  --  8.1* 7.8* 7.4* 7.0*  MG 2.8* 3.0* 2.9*  --   --   --   PHOS 3.1 2.6 2.6  --   --   --     CBC  Recent Labs Lab 09/21/17 0354 09/22/17 0100 09/22/17 0350  WBC 10.9*  11.3* 11.6*  HGB 7.7* 8.2* 8.5*  HCT 26.4* 27.3* 28.0*  PLT 223 202 199    Coag's  Recent Labs Lab 09/17/17 0550  09/20/17 0406 09/21/17 0354 09/22/17 0350  APTT 31  --   --   --   --   INR 1.64  < > 1.55 1.63 1.76  < > = values in this interval not displayed. Sepsis Markers  Recent Labs Lab 09/20/17 0406 09/21/17 0847 09/22/17 0350  PROCALCITON 0.30 0.23 0.22    ABG  Recent Labs Lab 09/17/17 0245 09/17/17 1435 09/20/17 2220  PHART 7.493* 7.475* 7.410  PCO2ART 37.7 38.2 36.8  PO2ART 116* 306* 174.0*   Liver Enzymes  Recent Labs Lab 09/18/17 0438 09/19/17 0358 09/22/17 0350  AST 40 62* 141*  ALT 27 33 80*  ALKPHOS 68 73 73  BILITOT 0.8 0.8 0.7  ALBUMIN 2.7* 2.6* 2.1*   Cardiac Enzymes No results for input(s): TROPONINI, PROBNP in the last 168 hours.  Glucose  Recent Labs Lab 09/21/17 1135 09/21/17 1635 09/21/17 2021 09/21/17 2331 09/22/17 0348 09/22/17 0840  GLUCAP 153* 259* 185* 237* 275* 160*   Imaging Dg Chest Port 1 View  Result Date: 09/22/2017 CLINICAL DATA:  Postop from CABG. EXAM: PORTABLE CHEST 1 VIEW  COMPARISON:  09/21/2017 FINDINGS: Decreased atelectasis or infiltrate seen in the left retrocardiac lung base. Heart size is stable within normal limits. Right arm PICC line, tracheostomy tube, and feeding tube remain in place. IMPRESSION: Resolving left retrocardiac atelectasis or infiltrate. Electronically Signed   By: Myles Rosenthal M.D.   On: 09/22/2017 07:24     CULTURES: 9/22 sputum>>>ng 9/24>> Sputum>>ng 9/24 Blood>>NEG  9/24 Urine>>ng 9/28 BC x 2 >coag negative staph 1/4 10/2>> respiratory flora, candida albicans 10/11 bc x 2>> 10/11 Sputum>> 10/11 UC>>neg  ANTIBIOTICS: zinacef >>>9/22- 9/24 ceftaz 9/22>>>9/28 Vanc 9/20>> off Zosyn 9/28 >>10/11 10/11 fortaz>> 10/11 vabc>> 10/11 diflucan>>  SIGNIFICANT EVENTS: 08/23/2017 - admit 9/22 - intubated Cath cabg RV assist device 9/23 - On epi/levo/RVAD/vent.  CI 1.8  despite all interventions 9/24>> Epi, Levo, milrinine,amio,insulin gtt, Lasix gtt, Heparin, Fent, Versed 9/26 - Remains sedated on fent/ versed drips RVAD flows down to 3200 Good UO on lasix gtt @ 15 9/27 - 40% fio2, peep 8. RPM on RVAD turned down. Stil on levophed, milrinone, epi ggtt and heparin.admio gtt. Making urine. Has chest draines. Good urine outoput. Heparin on hold briefly before resumptions due to bleed in groin site. Getting 2 UI PRBC 9/30 mucous plugging requiring multiple lavages 10/1 RV assist device  removed  in the OR 2/2 bleeding 1-0/2 - Stable off RVAD, remains on levo, vaso,Epi, milrinone 10/8 self extubated 10/9 intubated 10/10 trach  STUDIES: 10/6 CT head > NAICP, may need MRI to assess further  Lines/tubes: 10/1 R subclavian CVL >  ETT out 10/8, self extubation 10/9 ETT>>10/10 Trach 10/10>>  DISCUSSION: 66 y/o male s/p MI, RV failure, CABG, centrimag RV support deviced (now weaned off) who remains intubated for respiratory failure.  Currently being treated for HCAP.  10/9 post 24 hr self extubation and may need intubation. Re intubated 10/9 and  trached 10/10. 10/11 on t collar. Will need to pursue LTAC placement. 10/11 placed on abx per CVTS.  ASSESSMENT / PLAN:  PULMONARY A: Acute respiratory failure with hypoxemia > improving HCAP > infiltrate worse on left Self extubated 10/8 0300 10/9 remains tenuous, requiring NTS for poor cough mechanics  10/9 re intubated for safety  10/10 trached 10/11 on t collar P:   baseline vent settings noted Had fevers day prior, no sbt noted Would consider PS 12-15 assessments on wean Assessing ct chest abg last reviewed, keep same rest MV Also was pos again 1.8 liters, monitor these goals  CARDIOVASCULAR Lab Results  Component Value Date   INR 1.76 09/22/2017   INR 1.63 09/21/2017   INR 1.55 09/20/2017    A:  Cardiogenic shock > off pressors CAD, s/p MI and CABG RV failure, off assist devices PAF P:   arrhythmia meds per cardiology TCTS Tele Heparin/coumadin per pharmacy per pharm, note inr 10/12 dopmaine to CVTS goals  RENAL Lab Results  Component Value Date   CREATININE 1.49 (H) 09/22/2017   CREATININE 1.52 (H) 09/21/2017   CREATININE 1.50 (H) 09/20/2017    Recent Labs Lab 09/20/17 0406 09/21/17 0354 09/22/17 0350  K 4.2 4.0 3.6     A:   AKI>  Stable hypernatremia P:   Monitor BMET am Free water to continued, na trend  GASTROINTESTINAL A:   diarhea P:   Continue tube feeding cdiff may need to repeat  HEMATOLOGIC  Recent Labs  09/22/17 0100 09/22/17 0350  HGB 8.2* 8.5*   Lab Results  Component Value Date   INR 1.76 09/22/2017   INR  1.63 09/21/2017   INR 1.55 09/20/2017    A:  Heparin per cvts P:  Monitor for bleeding Heparin now off INR noted  INFECTIOUS A:   HCAP, fever on 10/6 Coag neg staph in blood> most likely contaminant 10/11 fever spike 102.4 P:   Abx per CVTS For CT chest Consider dc picc line Continue empiric nosocomial ABX On vanc Consider addition imi and empiric antifungals if respike   ENDOCRINE CBG (last 3)   Recent Labs  09/21/17 2331 09/22/17 0348 09/22/17 0840  GLUCAP 237* 275* 160*     A:   DM2, persistent hyperglycemia   P:   Continue SSI lantus up Low threshold back to stress steroids  NEUROLOGIC A:   10/5 New left sided weakness> resolved  10/6 10/9 weak but follows commands 10/9 reintubation therefore back on sedation till trache and weaning resumes. Trached 10/10 and off sedation 10/12 very weak P:    Remains very deconditioned  Aggressive pt  FAMILY  - Updates:  Patient updated bedside by me  Ccm time 30 min   Mcarthur Rossetti. Tyson Alias, MD, FACP Pgr: [phone] [facility]

## 2017-09-22 NOTE — Progress Notes (Signed)
ANTICOAGULATION CONSULT NOTE  Pharmacy Consult for heparin/Coumadin Indication: afib, s/p DCCV 9/29  Allergies  Allergen Reactions  . Aspirin Other (See Comments)    Stomach upset  . Latex Rash  . Oxycodone-Acetaminophen Itching    Patient Measurements: Height:  (180.3 cm) Weight: 184 lb 8.4 oz (83.7 kg) IBW/kg (Calculated) : 75.3  Vital Signs: Temp: 99.7 F (37.6 C) (10/15 0400) Temp Source: Oral (10/15 0400) BP: 106/65 (10/15 0600) Pulse Rate: 102 (10/15 0600)  Labs:  Recent Labs  09/20/17 0406  09/21/17 0354 09/21/17 0737 09/21/17 1530 09/22/17 0100 09/22/17 0350  HGB 7.9*  --  7.7*  --   --  8.2* 8.5*  HCT 27.4*  --  26.4*  --   --  27.3* 28.0*  PLT 234  --  223  --   --  202 199  LABPROT 18.4*  --  19.2*  --   --   --  20.3*  INR 1.55  --  1.63  --   --   --  1.76  HEPARINUNFRC  --   < >  --  0.74* 0.63  --  0.67  CREATININE 1.50*  --  1.52*  --   --   --  1.49*  < > = values in this interval not displayed.  Estimated Creatinine Clearance: 54 mL/min (A) (by C-G formula based on SCr of 1.49 mg/dL (H)).   Assessment: 89 yoM with post/op AFib s/p DCCV continues on IV heparin due to low INR. Heparin level therapeutic at 0.67 after rate adjustment yesterday, but at higher end of goal. INR continues to slowly rise at 1.76, CBC stable.  Goal of Therapy:  Heparin level 0.3-0.7 units/ml Monitor platelets by anticoagulation protocol: Yes   Plan:  -Reduce IV heparin slightly to 1150 units/hr -Coumadin 3 mg x 1 again tonight -Daily heparin level, CBC and INR  Fredonia Highland, PharmD PGY-2 Cardiology Pharmacy Resident Pager: [phone] 09/22/2017

## 2017-09-23 ENCOUNTER — Inpatient Hospital Stay (HOSPITAL_COMMUNITY): Payer: PPO

## 2017-09-23 DIAGNOSIS — R609 Edema, unspecified: Secondary | ICD-10-CM

## 2017-09-23 LAB — CBC
HCT: 26.4 % — ABNORMAL LOW (ref 39.0–52.0)
Hemoglobin: 8.1 g/dL — ABNORMAL LOW (ref 13.0–17.0)
MCH: 29.9 pg (ref 26.0–34.0)
MCHC: 30.7 g/dL (ref 30.0–36.0)
MCV: 97.4 fL (ref 78.0–100.0)
Platelets: 197 10*3/uL (ref 150–400)
RBC: 2.71 MIL/uL — ABNORMAL LOW (ref 4.22–5.81)
RDW: 16.3 % — ABNORMAL HIGH (ref 11.5–15.5)
WBC: 9.3 10*3/uL (ref 4.0–10.5)

## 2017-09-23 LAB — CULTURE, BLOOD (ROUTINE X 2)
CULTURE: NO GROWTH
Culture: NO GROWTH
SPECIAL REQUESTS: ADEQUATE
Special Requests: ADEQUATE

## 2017-09-23 LAB — GLUCOSE, CAPILLARY
GLUCOSE-CAPILLARY: 175 mg/dL — AB (ref 65–99)
GLUCOSE-CAPILLARY: 181 mg/dL — AB (ref 65–99)
GLUCOSE-CAPILLARY: 191 mg/dL — AB (ref 65–99)
GLUCOSE-CAPILLARY: 207 mg/dL — AB (ref 65–99)
Glucose-Capillary: 194 mg/dL — ABNORMAL HIGH (ref 65–99)
Glucose-Capillary: 227 mg/dL — ABNORMAL HIGH (ref 65–99)

## 2017-09-23 LAB — CULTURE, RESPIRATORY W GRAM STAIN: Culture: NORMAL

## 2017-09-23 LAB — BASIC METABOLIC PANEL
Anion gap: 4 — ABNORMAL LOW (ref 5–15)
BUN: 60 mg/dL — ABNORMAL HIGH (ref 6–20)
CO2: 24 mmol/L (ref 22–32)
Calcium: 7 mg/dL — ABNORMAL LOW (ref 8.9–10.3)
Chloride: 125 mmol/L — ABNORMAL HIGH (ref 101–111)
Creatinine, Ser: 1.45 mg/dL — ABNORMAL HIGH (ref 0.61–1.24)
GFR calc Af Amer: 58 mL/min — ABNORMAL LOW (ref >60)
GFR calc non Af Amer: 50 mL/min — ABNORMAL LOW (ref >60)
Glucose, Bld: 220 mg/dL — ABNORMAL HIGH (ref 65–99)
Potassium: 3.8 mmol/L (ref 3.5–5.1)
Sodium: 153 mmol/L — ABNORMAL HIGH (ref 135–145)

## 2017-09-23 LAB — PROTIME-INR
INR: 1.9
Prothrombin Time: 21.6 seconds — ABNORMAL HIGH (ref 11.4–15.2)

## 2017-09-23 LAB — CULTURE, RESPIRATORY: SPECIAL REQUESTS: NORMAL

## 2017-09-23 LAB — PROCALCITONIN: PROCALCITONIN: 0.28 ng/mL

## 2017-09-23 MED ORDER — WARFARIN SODIUM 3 MG PO TABS
3.0000 mg | ORAL_TABLET | Freq: Once | ORAL | Status: AC
  Administered 2017-09-23: 3 mg via ORAL
  Filled 2017-09-23: qty 1

## 2017-09-23 MED ORDER — ORAL CARE MOUTH RINSE
15.0000 mL | Freq: Four times a day (QID) | OROMUCOSAL | Status: DC
  Administered 2017-09-23 – 2017-09-26 (×12): 15 mL via OROMUCOSAL

## 2017-09-23 MED ORDER — FREE WATER: 400.0000 mL | Freq: Three times a day (TID) | Status: DC

## 2017-09-23 MED ORDER — FREE WATER
400.0000 mL | Freq: Four times a day (QID) | Status: DC
Start: 2017-09-23 — End: 2017-09-26
  Administered 2017-09-23 – 2017-09-26 (×12): 400 mL

## 2017-09-23 NOTE — Progress Notes (Signed)
Removed sutures from trach per order. No bleeding or complications.

## 2017-09-23 NOTE — Progress Notes (Signed)
15 Days Post-Op Procedure(s) (LRB): REMOVAL OF R-VENTRICULAR ASSIST DEVICE WITH CARDIOPULMONARY BYPASS (N/A) TRANSESOPHAGEAL ECHOCARDIOGRAM (TEE) (N/A) Subjective: Almost 4 weeks postop urgent CABG / RVAD for preop MI, RV infarcrt Now neuro intact on vent w/ trach, high fevers past 6 days now tafter adding merapenemand eraxis CT chest shows sternal wound healing but multifocal infiltrates c/w pneumonia Abd CT neg for biliary, bowel dz  Objective: Vital signs in last 24 hours: Temp:  [99.6 F (37.6 C)-102 F (38.9 C)] 99.8 F (37.7 C) (10/16 0731) Pulse Rate:  [94-103] 98 (10/16 1211) Cardiac Rhythm: Normal sinus rhythm (10/16 1200) Resp:  [16-35] 27 (10/16 1211) BP: (91-113)/(53-74) 111/60 (10/16 1211) SpO2:  [92 %-100 %] 100 % (10/16 1331) FiO2 (%):  [30 %] 30 % (10/16 1331) Weight:  [188 lb 4.4 oz (85.4 kg)] 188 lb 4.4 oz (85.4 kg) (10/16 0500)  Hemodynamic parameters for last 24 hours: CVP:  [5 mmHg-16 mmHg] 5 mmHg  Intake/Output from previous day: 10/15 0701 - 10/16 0700 In: 2010.6 [I.V.:180.6; NG/GT:1400; IV Piggyback:430] Out: 2115 [Urine:1615; Stool:500] Intake/Output this shift: Total I/O In: 945 [I.V.:65; NG/GT:650; IV Piggyback:230] Out: 925 [Urine:925]       Exam    General- alert and comfortable   Lungs- coarse BS   Cor- regular rate and rhythm, no murmur , gallop   Abdomen- soft, non-tender   Extremities - warm, non-tender, minimal edema   Neuro- oriented, appropriate, no focal weakness   Lab Results:  Recent Labs  09/22/17 0350 09/23/17 0351  WBC 11.6* 9.3  HGB 8.5* 8.1*  HCT 28.0* 26.4*  PLT 199 197   BMET:  Recent Labs  09/22/17 0350 09/23/17 0351  NA 149* 153*  K 3.6 3.8  CL 119* 125*  CO2 23 24  GLUCOSE 367* 220*  BUN 62* 60*  CREATININE 1.49* 1.45*  CALCIUM 7.0* 7.0*    PT/INR:  Recent Labs  09/23/17 0351  LABPROT 21.6*  INR 1.90   ABG    Component Value Date/Time   PHART 7.410 09/20/2017 2220   HCO3 22.8 09/20/2017  2220   TCO2 24 09/20/2017 2220   ACIDBASEDEF 1.0 09/20/2017 2220   O2SAT 100.0 09/20/2017 2220   CBG (last 3)   Recent Labs  09/23/17 0011 09/23/17 0402 09/23/17 0728  GLUCAP 191* 207* 194*    Assessment/Plan: S/P Procedure(s) (LRB): REMOVAL OF R-VENTRICULAR ASSIST DEVICE WITH CARDIOPULMONARY BYPASS (N/A) TRANSESOPHAGEAL ECHOCARDIOGRAM (TEE) (N/A)  Good cardiac function- last echo normal LV Follow CXR and if L effusion increases patient will need IR pigtail Cont current antibiotics LTAC consult   LOS: 31 days    Kathlee Nations Trigt III 09/23/2017

## 2017-09-23 NOTE — Progress Notes (Signed)
Patient ID: Clinton Harrington, male   DOB: 1951-05-16, 66 y.o.   MRN: [id number]        Advanced Heart Failure Rounding Note   Subjective:    Events CABG x4 IABP Centrimag 9/20    IABP removed 9/24.  Centrimag RVAD in place DC-CV successful 9/29  Centrimag removal 10/1 Self-extubated 10/8 Re-intubated 10/9 S/P Tach 10/10 Recultured 10/11  Fever last night 102. Antibiotics broadened. Foley removed. I&O cath x3 with >400 cc urine.   Denies pain.   ECHO 09/17/2017  Left ventricle: The cavity size was normal. There was moderate   concentric hypertrophy. Systolic function was normal. The   estimated ejection fraction was in the range of 60% to 65%.   Although no diagnostic regional wall motion abnormality was   identified, this possibility cannot be completely excluded on the   basis of this study. Doppler parameters are consistent with   abnormal left ventricular relaxation (grade 1 diastolic   dysfunction). Doppler parameters are consistent with   indeterminate mean left atrial filling pressure. - Ventricular septum: Septal motion showed paradox. - Aortic valve: There was mild stenosis. Valve area (VTI): 1.99   cm^2. Valve area (Vmax): 1.99 cm^2. Valve area (Vmean): 1.99   cm^2. - Right ventricle: Systolic function was moderately reduced. Impressions: - Compared to 09/05/2017, the right ventricle appears less dilated   and there is some improvement in RV function.   Objective:   Weight Range: 188 lb 4.4 oz (85.4 kg) Body mass index is 26.26 kg/m.   Vital Signs:   Temp:  [99.6 F (37.6 C)-102 F (38.9 C)] 99.6 F (37.6 C) (10/16 0000) Pulse Rate:  [94-108] 97 (10/16 0700) Resp:  [16-37] 33 (10/16 0700) BP: (88-109)/(53-77) 100/62 (10/16 0700) SpO2:  [90 %-100 %] 97 % (10/16 0700) FiO2 (%):  [30 %] 30 % (10/16 0400) Weight:  [188 lb 4.4 oz (85.4 kg)] 188 lb 4.4 oz (85.4 kg) (10/16 0500) Last BM Date: 09/21/17  Weight change: Filed Weights   09/20/17 2000  09/22/17 0402 09/23/17 0500  Weight: 179 lb (81.2 kg) 184 lb 8.4 oz (83.7 kg) 188 lb 4.4 oz (85.4 kg)    Intake/Output:   Intake/Output Summary (Last 24 hours) at 09/23/17 0715 Last data filed at 09/23/17 0701  Gross per 24 hour  Intake           2110.6 ml  Output             2115 ml  Net             -4.4 ml      Physical Exam  CVP 5  General:  Trach.  No resp difficulty HEENT: L nare cortrack. Trach Neck: supple. no JVD. Carotids 2+ bilat; no bruits. No lymphadenopathy or thryomegaly appreciated. Cor: PMI nondisplaced. Regular rate & rhythm. No rubs, gallops or murmurs. Lungs: coarse throughout Abdomen: soft, nontender, nondistended. No hepatosplenomegaly. No bruits or masses. Good bowel sounds. Extremities: no cyanosis, clubbing, rash, R and LLE heel boots. RUE PICC  Neuro: Trach. MAE. Follows commands.    Telemetry  SR-ST 90-100s personally reviewed.   Labs    CBC  Recent Labs  09/22/17 0350 09/23/17 0351  WBC 11.6* 9.3  HGB 8.5* 8.1*  HCT 28.0* 26.4*  MCV 97.9 97.4  PLT 199 465   Basic Metabolic Panel  Recent Labs  09/22/17 0350 09/23/17 0351  NA 149* 153*  K 3.6 3.8  CL 119* 125*  CO2 23 24  GLUCOSE  367* 220*  BUN 62* 60*  CREATININE 1.49* 1.45*  CALCIUM 7.0* 7.0*   Liver Function Tests  Recent Labs  09/22/17 0350  AST 141*  ALT 80*  ALKPHOS 73  BILITOT 0.7  PROT 6.6  ALBUMIN 2.1*   No results for input(s): LIPASE, AMYLASE in the last 72 hours. Cardiac Enzymes No results for input(s): CKTOTAL, CKMB, CKMBINDEX, TROPONINI in the last 72 hours.  BNP: BNP (last 3 results)  Recent Labs  08/23/17 1927  BNP 200.0*    ProBNP (last 3 results) No results for input(s): PROBNP in the last 8760 hours.   D-Dimer No results for input(s): DDIMER in the last 72 hours. Hemoglobin A1C No results for input(s): HGBA1C in the last 72 hours. Fasting Lipid Panel No results for input(s): CHOL, HDL, LDLCALC, TRIG, CHOLHDL, LDLDIRECT in the last  72 hours. Thyroid Function Tests No results for input(s): TSH, T4TOTAL, T3FREE, THYROIDAB in the last 72 hours.  Invalid input(s): FREET3  Other results:   Imaging    Ct Abdomen Pelvis Wo Contrast  Result Date: 09/22/2017 CLINICAL DATA:  Un resolving pneumonia. EXAM: CT CHEST, ABDOMEN AND PELVIS WITHOUT CONTRAST TECHNIQUE: Multidetector CT imaging of the chest, abdomen and pelvis was performed following the standard protocol without IV contrast. COMPARISON:  Chest radiograph of same day. CT scan of July 22, 2007. FINDINGS: CT CHEST FINDINGS Cardiovascular: Atherosclerosis of thoracic aorta is noted without aneurysm formation. Status post coronary artery bypass graft. Minimal pericardial effusion is noted. Mediastinum/Nodes: Tracheostomy tube is in grossly good position. Feeding tube is seen passing through esophagus. Thyroid gland is unremarkable. No mediastinal adenopathy is noted. Lungs/Pleura: Mild left pleural effusion is noted with adjacent subsegmental atelectasis. Multifocal airspace opacities are noted in the right lower lobe most consistent with pneumonia. Musculoskeletal: No chest wall mass or suspicious bone lesions identified. CT ABDOMEN PELVIS FINDINGS Hepatobiliary: No focal liver abnormality is seen. No gallstones, gallbladder wall thickening, or biliary dilatation. Pancreas: Unremarkable. No pancreatic ductal dilatation or surrounding inflammatory changes. Spleen: Normal in size without focal abnormality. Adrenals/Urinary Tract: Adrenal glands and kidneys appear normal. No renal or ureteral calculi are noted. Vascular calcifications are noted in both kidneys. No hydronephrosis or renal obstruction is noted. Superior portion of urinary bladder is seen extending into moderate-sized left inguinal hernia. Stomach/Bowel: Rectal tube is present. Distal tip of feeding tube is seen in fourth portion of duodenum. There is no evidence of bowel obstruction or inflammation. The appendix appears  normal. Vascular/Lymphatic: Aortic atherosclerosis. No enlarged abdominal or pelvic lymph nodes. Reproductive: Prostate is unremarkable. Other: Moderate size left inguinal hernia is noted which contains a portion of the superior portion of urinary bladder. Musculoskeletal: No acute or significant osseous findings. IMPRESSION: Multifocal airspace opacities are noted in the right lower lobe concerning for multifocal pneumonia. Mild left pleural effusion is noted with adjacent subsegmental atelectasis. Tracheostomy and feeding tubes are in grossly good position. Minimal pericardial effusion. Aortic atherosclerosis. Moderate size left inguinal hernia is noted which contains a portion of the urinary bladder. Electronically Signed   By: Marijo Conception, M.D.   On: 09/22/2017 13:48   Ct Chest Without Contrast  Result Date: 09/22/2017 CLINICAL DATA:  Un resolving pneumonia. EXAM: CT CHEST, ABDOMEN AND PELVIS WITHOUT CONTRAST TECHNIQUE: Multidetector CT imaging of the chest, abdomen and pelvis was performed following the standard protocol without IV contrast. COMPARISON:  Chest radiograph of same day. CT scan of July 22, 2007. FINDINGS: CT CHEST FINDINGS Cardiovascular: Atherosclerosis of thoracic aorta is noted  without aneurysm formation. Status post coronary artery bypass graft. Minimal pericardial effusion is noted. Mediastinum/Nodes: Tracheostomy tube is in grossly good position. Feeding tube is seen passing through esophagus. Thyroid gland is unremarkable. No mediastinal adenopathy is noted. Lungs/Pleura: Mild left pleural effusion is noted with adjacent subsegmental atelectasis. Multifocal airspace opacities are noted in the right lower lobe most consistent with pneumonia. Musculoskeletal: No chest wall mass or suspicious bone lesions identified. CT ABDOMEN PELVIS FINDINGS Hepatobiliary: No focal liver abnormality is seen. No gallstones, gallbladder wall thickening, or biliary dilatation. Pancreas: Unremarkable.  No pancreatic ductal dilatation or surrounding inflammatory changes. Spleen: Normal in size without focal abnormality. Adrenals/Urinary Tract: Adrenal glands and kidneys appear normal. No renal or ureteral calculi are noted. Vascular calcifications are noted in both kidneys. No hydronephrosis or renal obstruction is noted. Superior portion of urinary bladder is seen extending into moderate-sized left inguinal hernia. Stomach/Bowel: Rectal tube is present. Distal tip of feeding tube is seen in fourth portion of duodenum. There is no evidence of bowel obstruction or inflammation. The appendix appears normal. Vascular/Lymphatic: Aortic atherosclerosis. No enlarged abdominal or pelvic lymph nodes. Reproductive: Prostate is unremarkable. Other: Moderate size left inguinal hernia is noted which contains a portion of the superior portion of urinary bladder. Musculoskeletal: No acute or significant osseous findings. IMPRESSION: Multifocal airspace opacities are noted in the right lower lobe concerning for multifocal pneumonia. Mild left pleural effusion is noted with adjacent subsegmental atelectasis. Tracheostomy and feeding tubes are in grossly good position. Minimal pericardial effusion. Aortic atherosclerosis. Moderate size left inguinal hernia is noted which contains a portion of the urinary bladder. Electronically Signed   By: Marijo Conception, M.D.   On: 09/22/2017 13:48     Medications:     Scheduled Medications: . atorvastatin  80 mg Per Tube q1800  . brimonidine  1 drop Right Eye BID  . budesonide (PULMICORT) nebulizer solution  0.25 mg Nebulization BID  . chlorhexidine gluconate (MEDLINE KIT)  15 mL Mouth Rinse BID  . Chlorhexidine Gluconate Cloth  6 each Topical Daily  . collagenase   Topical Daily  . feeding supplement (PRO-STAT SUGAR FREE 64)  30 mL Per Tube TID  . free water  200 mL Per Tube Q8H  . insulin aspart  0-24 Units Subcutaneous Q4H  . insulin glargine  30 Units Subcutaneous BID  .  levalbuterol  1.25 mg Nebulization Q6H  . mouth rinse  15 mL Mouth Rinse 10 times per day  . mupirocin cream   Topical BID  . pantoprazole sodium  40 mg Per Tube Daily  . sodium chloride flush  10-40 mL Intracatheter Q12H  . sodium chloride flush  3 mL Intravenous Q12H  . spironolactone  12.5 mg Per Tube Daily  . Warfarin - Pharmacist Dosing Inpatient   Does not apply q1800    Infusions: . sodium chloride    . sodium chloride 10 mL/hr at 09/22/17 0600  . anidulafungin (ERAXIS) 100 mg IVPB Stopped (09/22/17 1959)  . DOPamine 2.5 mcg/kg/min (09/22/17 0924)  . feeding supplement (VITAL 1.5 CAL) 1,000 mL (09/22/17 1134)  . meropenem (MERREM) IV Stopped (09/23/17 6256)  . norepinephrine (LEVOPHED) Adult infusion 0 mcg/min (09/22/17 0924)  . potassium chloride 10 mEq (09/22/17 3893)  . vancomycin 500 mg (09/23/17 0701)    PRN Medications: acetaminophen (TYLENOL) oral liquid 160 mg/5 mL, fentaNYL (SUBLIMAZE) injection, metoprolol tartrate, midazolam, midazolam, ondansetron (ZOFRAN) IV, oxyCODONE, potassium chloride, sodium chloride flush, sodium chloride flush    Patient Profile  Mr Halley is a 66 year old with a history of DMII, smoker, CAD S/P PCI RCA 20 years ago, HTN, PAD, and hyperlipidemia admitted with CP and inferior MI with RV infarction.  He underwent LHC with IABP. After optimized, underwent CABG x4 centrimag for RV infarct on 9/20 and continued IABP.   Assessment/Plan   1. Post-cardiotomy cardiogenic shock: Primarily RV failure from inferior infarct with RV involvement.  Repeat echo on 9/24 showed vigorous LV systolic function with moderate RV dysfunction.  Now s/p CABG and Centrimag for RV support on 9/20. IABP removed 9/24 with good LV function.  Centrimag out 10/1.  ECHO 09/17/2017 EF 60-65%, grade I DD, RV with some improvement/less dilated.  -  CVP 5. Volume status stable.  On dopamine 2.5 mg .  SBP remains soft, suspect septic/vasogenic shock.  - Continue 12.5 mg spiro  daily.  2. CAD: Inferior MI with RV involvement at presentation, now s/p CABG.   - Continue asa to 81 mg daily.  - Continue statin.  3. Atrial fibrillation/flutter: DCCV 9/29, now remains in NSR.  - Continue amiodarone per tube. Continue amio 200 mg twice a day,  - On warfarin.  INR 1.9.    4. AKI: Creatinine unchanged 1.4.  5. Acute respiratory failure: Suspect left-sided HCAP. Self-extubated 09/15/17. Had resp distress overnight into 09/16/17, remained weak requiring re-intubation.  S/P Trach 09/17/2017. On 40%.  - Trach collar trial again today => did not last long before he tired.  - Abx broadened.  6. Neuro: ? L-sided weakness now resolved.  Head CT 10/5 negative.  He is awake and alert.  7. ID: RLL PNA on chest CT 10/15, HCAP.  Suspect hypotension is septic/vasogenic shock.  Has pressure ulcer that could be source as well.  - Now on meropenem, vancomycin, anidulafungin.   8. Anemia: Received 2 uPRBCs 09/04/17. 4 units PRBCs on 9/29. Received 2U PRBCs 10/1.    He received IV Fe on 10/11.  Hgb 8.1.  10. Unstageable Pressure Ulcer - R Buttock partial thickness. Left buttock with slough. Continue santyl to left buttock for selective debridement.  - Continue reposition. On tube feeds. WOC to reassess today. PT to start hydrotherapy.  11. Diabetes: Better controlled with addition of lantus.  12. Deconditioning: PT consulted. CIR recommended.   13. Hypernatremia: Sodium trending up 153.  Increase free water to 400 cc every 8 hrs.  14. Urinary Retention- Requiring I&O cath. Darrick Grinder, NP  09/23/2017 7:15 AM   Advanced Heart Failure Team Pager [phone] (M-F; [place])  Please contact [facility] for night-coverage after hours (4p -7a ) and weekends on [email or web address]  Patient seen with NP, agree with the above note.   He still remains mildly hypotensive requiring low dose dopamine. Suspect septic/vasogenic shock with source RLL PNA versus decubitus ulcer. Abx broadened as above, still  spiking fevers.   CVP 5, diuretics held.   Remains in NSR on amiodarone and warfarin.   RLL PNA, did not last long on trach collar trial today.   Remains alert/oriented.   Hypernatremia worsened, increase free water boluses as above.   Loralie Champagne 09/23/2017 7:58 AM

## 2017-09-23 NOTE — Progress Notes (Signed)
ANTICOAGULATION CONSULT NOTE  Pharmacy Consult for heparin/Coumadin Indication: afib, s/p DCCV 9/29  Allergies  Allergen Reactions  . Aspirin Other (See Comments)    Stomach upset  . Latex Rash  . Oxycodone-Acetaminophen Itching    Patient Measurements: Height:  (180.3 cm) Weight: 188 lb 4.4 oz (85.4 kg) IBW/kg (Calculated) : 75.3  Vital Signs: Temp: 99.6 F (37.6 C) (10/16 0000) Temp Source: Oral (10/16 0000) BP: 100/62 (10/16 0700) Pulse Rate: 97 (10/16 0700)  Labs:  Recent Labs  09/21/17 0354 09/21/17 0737 09/21/17 1530 09/22/17 0100 09/22/17 0350 09/23/17 0351  HGB 7.7*  --   --  8.2* 8.5* 8.1*  HCT 26.4*  --   --  27.3* 28.0* 26.4*  PLT 223  --   --  202 199 197  LABPROT 19.2*  --   --   --  20.3* 21.6*  INR 1.63  --   --   --  1.76 1.90  HEPARINUNFRC  --  0.74* 0.63  --  0.67  --   CREATININE 1.52*  --   --   --  1.49* 1.45*    Estimated Creatinine Clearance: 55.5 mL/min (A) (by C-G formula based on SCr of 1.45 mg/dL (H)).   Assessment: 60 yoM with post/op AFib s/p DCCV continues on warfarin per pharmacy. Heparin bridge discontinued per TCTS yesterday. INR continues to trend up to 1.90, CBC stable.  Goal of Therapy:  INR 2-3 Monitor platelets by anticoagulation protocol: Yes   Plan: -Coumadin 3 mg PO x1 again tonight -Daily  INR  Fredonia Highland, PharmD PGY-2 Cardiology Pharmacy Resident Pager: [phone] 09/23/2017

## 2017-09-23 NOTE — Progress Notes (Signed)
PULMONARY / CRITICAL CARE MEDICINE   Name: Clinton Harrington MRN: [id number] DOB: 06/01/1951    ADMISSION DATE:  08/23/2017 CONSULTATION DATE:  08/30/2017  REFERRING MD:  Cornelius Moras  CHIEF COMPLAINT:  Acute respiratory failure with hypoxemia  BRIEF:  66 y/o male with CAD who had an MI on 9/15, had severe RV failure, was treated with CABG, IABP, eventual centrimag device placed.  Post operatively was weaned off of the RVAD device on 10/1, remains mechanically ventilated.  Currently being treated for LLL HCAP. Completed 12 abx.  trached 10/10. 10/11 spiked fever and pan cultured  SUBJECTIVE:  Remains on  Dopamine, awake and alert. Fever spike 10/15  VITAL SIGNS: BP 100/62   Pulse 96   Temp 99.8 F (37.7 C) (Oral)   Resp (!) 35   Ht  (1.803 m)   Wt 188 lb 4.4 oz (85.4 kg)   SpO2 95%   BMI 26.26 kg/m   HEMODYNAMICS: CVP:  [7 mmHg-25 mmHg] 7 mmHg  VENTILATOR SETTINGS: Vent Mode: PSV;CPAP FiO2 (%):  [30 %] 30 % Set Rate:  [16 bmp] 16 bmp Vt Set:  [600 mL] 600 mL PEEP:  [5 cmH20] 5 cmH20 Pressure Support:  [10 cmH20] 10 cmH20 Plateau Pressure:  [12 cmH20-22 cmH20] 19 cmH20  INTAKE / OUTPUT: I/O last 3 completed shifts: In: 3684.3 [I.V.:684.3; Blood:320; NG/GT:2050; IV Piggyback:630] Out: 2765 [Urine:2265; Stool:500]  PHYSICAL EXAMINATION: General:  Weak male on full vent support. HEENT# 6 trach cuffed  Cdi, sutures in place, will remove PSY: Nl affect Neuro: Awake and follows commands weakly CV: HSR RRR PULM: even/non-labored, lungs bilaterally mild rhonchi WU:JWJX, non-tender, bsx4 active TF Extremities: warm/dry, +edema  Skin: no rashes or lesions     LABS:  BMET  Recent Labs Lab 09/21/17 0354 09/22/17 0350 09/23/17 0351  NA 151* 149* 153*  K 4.0 3.6 3.8  CL 122* 119* 125*  CO2 BUN 55* 62* 60*  CREATININE 1.52* 1.49* 1.45*  GLUCOSE 314* 367* 220*    Electrolytes  Recent Labs Lab 09/18/17 0438 09/18/17 1900 09/19/17 0358   09/21/17 0354 09/22/17 0350 09/23/17 0351  CALCIUM 7.9*  --  8.1*  < > 7.4* 7.0* 7.0*  MG 2.8* 3.0* 2.9*  --   --   --   --   PHOS 3.1 2.6 2.6  --   --   --   --   < > = values in this interval not displayed.  CBC  Recent Labs Lab 09/22/17 0100 09/22/17 0350 09/23/17 0351  WBC 11.3* 11.6* 9.3  HGB 8.2* 8.5* 8.1*  HCT 27.3* 28.0* 26.4*  PLT 202 199 197    Coag's  Recent Labs Lab 09/17/17 0550  09/21/17 0354 09/22/17 0350 09/23/17 0351  APTT 31  --   --   --   --   INR 1.64  < > 1.63 1.76 1.90  < > = values in this interval not displayed. Sepsis Markers  Recent Labs Lab 09/21/17 0847 09/22/17 0350 09/23/17 0351  PROCALCITON 0.23 0.22 0.28    ABG  Recent Labs Lab 09/17/17 0245 09/17/17 1435 09/20/17 2220  PHART 7.493* 7.475* 7.410  PCO2ART 37.7 38.2 36.8  PO2ART 116* 306* 174.0*   Liver Enzymes  Recent Labs Lab 09/18/17 0438 09/19/17 0358 09/22/17 0350  AST 40 62* 141*  ALT 27 33 80*  ALKPHOS 68 73 73  BILITOT 0.8 0.8 0.7  ALBUMIN 2.7* 2.6* 2.1*   Cardiac Enzymes No results for input(s):  TROPONINI, PROBNP in the last 168 hours.  Glucose  Recent Labs Lab 09/22/17 1151 09/22/17 1558 09/22/17 1925 09/23/17 0011 09/23/17 0402 09/23/17 0728  GLUCAP 142* 151* 168* 191* 207* 194*   Imaging Ct Abdomen Pelvis Wo Contrast  Result Date: 09/22/2017 CLINICAL DATA:  Un resolving pneumonia. EXAM: CT CHEST, ABDOMEN AND PELVIS WITHOUT CONTRAST TECHNIQUE: Multidetector CT imaging of the chest, abdomen and pelvis was performed following the standard protocol without IV contrast. COMPARISON:  Chest radiograph of same day. CT scan of July 22, 2007. FINDINGS: CT CHEST FINDINGS Cardiovascular: Atherosclerosis of thoracic aorta is noted without aneurysm formation. Status post coronary artery bypass graft. Minimal pericardial effusion is noted. Mediastinum/Nodes: Tracheostomy tube is in grossly good position. Feeding tube is seen passing through esophagus.  Thyroid gland is unremarkable. No mediastinal adenopathy is noted. Lungs/Pleura: Mild left pleural effusion is noted with adjacent subsegmental atelectasis. Multifocal airspace opacities are noted in the right lower lobe most consistent with pneumonia. Musculoskeletal: No chest wall mass or suspicious bone lesions identified. CT ABDOMEN PELVIS FINDINGS Hepatobiliary: No focal liver abnormality is seen. No gallstones, gallbladder wall thickening, or biliary dilatation. Pancreas: Unremarkable. No pancreatic ductal dilatation or surrounding inflammatory changes. Spleen: Normal in size without focal abnormality. Adrenals/Urinary Tract: Adrenal glands and kidneys appear normal. No renal or ureteral calculi are noted. Vascular calcifications are noted in both kidneys. No hydronephrosis or renal obstruction is noted. Superior portion of urinary bladder is seen extending into moderate-sized left inguinal hernia. Stomach/Bowel: Rectal tube is present. Distal tip of feeding tube is seen in fourth portion of duodenum. There is no evidence of bowel obstruction or inflammation. The appendix appears normal. Vascular/Lymphatic: Aortic atherosclerosis. No enlarged abdominal or pelvic lymph nodes. Reproductive: Prostate is unremarkable. Other: Moderate size left inguinal hernia is noted which contains a portion of the superior portion of urinary bladder. Musculoskeletal: No acute or significant osseous findings. IMPRESSION: Multifocal airspace opacities are noted in the right lower lobe concerning for multifocal pneumonia. Mild left pleural effusion is noted with adjacent subsegmental atelectasis. Tracheostomy and feeding tubes are in grossly good position. Minimal pericardial effusion. Aortic atherosclerosis. Moderate size left inguinal hernia is noted which contains a portion of the urinary bladder. Electronically Signed   By: Lupita Raider, M.D.   On: 09/22/2017 13:48   Ct Chest Without Contrast  Result Date:  09/22/2017 CLINICAL DATA:  Un resolving pneumonia. EXAM: CT CHEST, ABDOMEN AND PELVIS WITHOUT CONTRAST TECHNIQUE: Multidetector CT imaging of the chest, abdomen and pelvis was performed following the standard protocol without IV contrast. COMPARISON:  Chest radiograph of same day. CT scan of July 22, 2007. FINDINGS: CT CHEST FINDINGS Cardiovascular: Atherosclerosis of thoracic aorta is noted without aneurysm formation. Status post coronary artery bypass graft. Minimal pericardial effusion is noted. Mediastinum/Nodes: Tracheostomy tube is in grossly good position. Feeding tube is seen passing through esophagus. Thyroid gland is unremarkable. No mediastinal adenopathy is noted. Lungs/Pleura: Mild left pleural effusion is noted with adjacent subsegmental atelectasis. Multifocal airspace opacities are noted in the right lower lobe most consistent with pneumonia. Musculoskeletal: No chest wall mass or suspicious bone lesions identified. CT ABDOMEN PELVIS FINDINGS Hepatobiliary: No focal liver abnormality is seen. No gallstones, gallbladder wall thickening, or biliary dilatation. Pancreas: Unremarkable. No pancreatic ductal dilatation or surrounding inflammatory changes. Spleen: Normal in size without focal abnormality. Adrenals/Urinary Tract: Adrenal glands and kidneys appear normal. No renal or ureteral calculi are noted. Vascular calcifications are noted in both kidneys. No hydronephrosis or renal obstruction is noted. Superior  portion of urinary bladder is seen extending into moderate-sized left inguinal hernia. Stomach/Bowel: Rectal tube is present. Distal tip of feeding tube is seen in fourth portion of duodenum. There is no evidence of bowel obstruction or inflammation. The appendix appears normal. Vascular/Lymphatic: Aortic atherosclerosis. No enlarged abdominal or pelvic lymph nodes. Reproductive: Prostate is unremarkable. Other: Moderate size left inguinal hernia is noted which contains a portion of the  superior portion of urinary bladder. Musculoskeletal: No acute or significant osseous findings. IMPRESSION: Multifocal airspace opacities are noted in the right lower lobe concerning for multifocal pneumonia. Mild left pleural effusion is noted with adjacent subsegmental atelectasis. Tracheostomy and feeding tubes are in grossly good position. Minimal pericardial effusion. Aortic atherosclerosis. Moderate size left inguinal hernia is noted which contains a portion of the urinary bladder. Electronically Signed   By: Lupita Raider, M.D.   On: 09/22/2017 13:48    Intake/Output Summary (Last 24 hours) at 09/23/17 0809 Last data filed at 09/23/17 0701  Gross per 24 hour  Intake           2054.9 ml  Output             1940 ml  Net            114.9 ml    CULTURES: 9/22 sputum>>>ng 9/24>> Sputum>>ng 9/24 Blood>>NEG  9/24 Urine>>ng 9/28 BC x 2 >coag negative staph 1/4 10/2>> respiratory flora, candida albicans 10/11 bc x 2>> 10/13 bc>> 10/11 Sputum>>Staph coag neg 10/13 c dif>>neg 10/11 UC>>neg  ANTIBIOTICS: zinacef >>>9/22- 9/24 ceftaz 9/22>>>9/28 Vanc 9/20>> off Zosyn 9/28 >>10/11 10/11 fortaz>>off 10/14 vanc>> 10/11 diflucan>>off 1015 Eraxis>> 10/15 meropenem>>    SIGNIFICANT EVENTS: 08/23/2017 - admit 9/22 - intubated Cath cabg RV assist device 9/23 - On epi/levo/RVAD/vent.  CI 1.8 despite all interventions 9/24>> Epi, Levo, milrinine,amio,insulin gtt, Lasix gtt, Heparin, Fent, Versed 9/26 - Remains sedated on fent/ versed drips RVAD flows down to 3200 Good UO on lasix gtt @ 15 9/27 - 40% fio2, peep 8. RPM on RVAD turned down. Stil on levophed, milrinone, epi ggtt and heparin.admio gtt. Making urine. Has chest draines. Good urine outoput. Heparin on hold briefly before resumptions due to bleed in groin site. Getting 2 UI PRBC 9/30 mucous plugging requiring multiple lavages 10/1 RV assist device  removed  in the OR 2/2 bleeding 1-0/2 - Stable off RVAD, remains on levo,  vaso,Epi, milrinone 10/8 self extubated 10/9 intubated 10/10 trach  STUDIES: 10/6 CT head > NAICP, may need MRI to assess further 10/15 ct chest rt ll pna Lines/tubes: 10/1 R subclavian CVL > dc ETT out 10/8, self extubation 10/9 ETT>>10/10 Trach 10/10>> 10/12 rt picc>>  DISCUSSION: 66 y/o male s/p MI, RV failure, CABG, centrimag RV support deviced (now weaned off) who remains intubated for respiratory failure.  Currently being treated for HCAP.  10/9 post 24 hr self extubation and may need intubation. Re intubated 10/9 and  trached 10/10. 10/11 on t collar. Will need to pursue LTAC placement. 10/11 placed on abx per CVTS.  ASSESSMENT / PLAN:  PULMONARY A: Acute respiratory failure with hypoxemia > improving HCAP > infiltrate worse on left Self extubated 10/8 0300 10/9 remains tenuous, requiring NTS for poor cough mechanics  10/9 re intubated for safety  10/10 trached 10/11 on t collar 10/16 remove trach sutures Weaning impeded due to fevers and weakness CT chest 10/15 rll pna P:   baseline vent settings noted Wean as tolerated +i/o noted Treat pna  CARDIOVASCULAR Lab Results  Component Value Date   INR 1.90 09/23/2017   INR 1.76 09/22/2017   INR 1.63 09/21/2017    A:  Cardiogenic shock > on dopamine  per cvts CAD, s/p MI and CABG RV failure, off assist devices PAF P:  arrhythmia meds per cardiology TCTS Tele Heparin/coumadin per pharmacy per pharm,  dopmaine to CVTS goals  RENAL Lab Results  Component Value Date   CREATININE 1.45 (H) 09/23/2017   CREATININE 1.49 (H) 09/22/2017   CREATININE 1.52 (H) 09/21/2017    Recent Labs Lab 09/21/17 0354 09/22/17 0350 09/23/17 0351  K 4.0 3.6 3.8    Recent Labs Lab 09/21/17 0354 09/22/17 0350 09/23/17 0351  NA 151* 149* 153*     A:   AKI>  Stable hypernatremia P:   Monitor BMET am Free water increased 10/16, may need to stop aldactone as na increasing with diarrhea a contributing  factor  GASTROINTESTINAL A:   diarrhea Note ct abd 10/15 left inguinal hernia with portion of bladder  P:   Continue tube feeding cdiff may need to repeated was neg 10/13  HEMATOLOGIC  Recent Labs  09/22/17 0350 09/23/17 0351  HGB 8.5* 8.1*   Lab Results  Component Value Date   INR 1.90 09/23/2017   INR 1.76 09/22/2017   INR 1.63 09/21/2017    A:  Heparin per cvts P:  Monitor for bleeding Heparin now off INR noted  INFECTIOUS A:   HCAP, fever on 10/6 Coag neg staph in blood> most likely contaminant 10/11 fever spike 102.4 10/15 102 and broad spec abx added 10/15 ct chest rt multifocal asdx cw pna P:   Abx per CVTS Consider dc picc line Continue empiric nosocomial ABX On vanc/meropenem/difucan as of 10/15    ENDOCRINE CBG (last 3)   Recent Labs  09/23/17 0011 09/23/17 0402 09/23/17 0728  GLUCAP 191* 207* 194*     A:   DM2, persistent hyperglycemia   P:   Continue SSI lantus increased 10/15 with downward glucose trend 10/16 Low threshold back to stress steroids  NEUROLOGIC A:   10/5 New left sided weakness> resolved  10/6 10/9 weak but follows commands 10/9 reintubation therefore back on sedation till trache and weaning resumes. Trached 10/10 and off sedation 10/12 very weak 10/16 remains weak but follows commands P:    Remains very deconditioned   Aggressive pt  FAMILY  - Updates:  Patient updated bedside by me  Ccm time 30 min   Brett Canales Minor ACNP Adolph Pollack PCCM Pager [phone] till 3 pm If no answer page [phone] 09/23/2017, 7:55 AM  STAFF NOTE: I, Rory Percy, MD FACP have personally reviewed patient's available data, including medical history, events of note, physical examination and test results as part of my evaluation. I have discussed with resident/NP and other care providers such as pharmacist, RN and RRT. In addition, I personally evaluated patient and elicited key findings of: fever, follows commands, ronchi, slight  reduced left abse, abdo soft, edema on lowers, pcxr which I reviewed shows sligtht hazzines left base, CT chest shows a small slight maybe loculated left base effusion, several small inflammatory peripheral areas, fever noted, agree with imipenem addition and eraxis, will follow fever curve with start of these antibacterials, given PCT neg I thought should treat antifungals, weaning is poor, repeat weaning PS 12-15 if needed to 20, odd that he now weans por from prior trach collar, with no real answers on CT for poor weaning, have allow threshold to assess for PE, would  favor doppler legs assessments to start and low clinical suspicious, repeat pcxr in am, if fevers continues despiote abx , would agree needs CT guided drainage of effusion, hope can avoid The patient is critically ill with multiple organ systems failure and requires high complexity decision making for assessment and support, frequent evaluation and titration of therapies, application of advanced monitoring technologies and extensive interpretation of multiple databases.   Critical Care Time devoted to patient care services described in this note is30 Minutes. This time reflects time of care of this signee: Rory Percy, MD FACP. This critical care time does not reflect procedure time, or teaching time or supervisory time of PA/NP/Med student/Med Resident etc but could involve care discussion time. Rest per NP/medical resident whose note is outlined above and that I agree with   Mcarthur Rossetti. Tyson Alias, MD, FACP Pgr: [phone] [facility] 09/23/2017 11:13 AM

## 2017-09-23 NOTE — Progress Notes (Signed)
*  PRELIMINARY RESULTS* Vascular Ultrasound Lower ext venous duplex has been completed.  Preliminary findings: No obvious evidence of DVT or baker's cyst.   Farrel Demark, RDMS, RVT  09/23/2017, 3:52 PM

## 2017-09-23 NOTE — Progress Notes (Addendum)
Discharge Planning: Contacted [facility] # [phone]. Spoke to [employer]. Requested referral for [facility]. States information will be sent and reviewed with earliest decision tomorrow, 09/24/2017. Attempted call to mother, Myriam Jacobson # [phone]. Left HIPAA compliant message for return call for selection on [facility] or [facility].  Isidoro Donning RN CCM Case Mgmt phone [phone]

## 2017-09-23 NOTE — Care Management Note (Signed)
Case Management Note  Patient Details  Name: ZACKERIE SARA MRN: [id number] Date of Birth: [birth year]  Subjective/Objective:      CM following for progression and d/c planning.               Action/Plan: 09/23/2017 Noted MD progression notes of 09/22/2017 questioning possible LTAC placement for this pt. This Pt was discussed in [facility] on 09/23/17 am and medical director , Dr Quillian Quince approved search for LTAC. This CM contacted [facility] for possible admission. Both facilities feel that they could make an offer to this pt for placement. As this pt has [employer] ([employer]) insurance this requires the CM to call to discuss the need for LTAC, the number for [employer] preapproval is [phone]. Number provided to pt current CM.   Expected Discharge Date:                  Expected Discharge Plan:  Long Term Acute Care (LTAC)  In-House Referral:  NA  Discharge planning Services  CM Consult  Post Acute Care Choice:    Choice offered to:     DME Arranged:    DME Agency:     HH Arranged:    HH Agency:     Status of Service:  In process, will continue to follow  If discussed at Long Length of Stay Meetings, dates discussed:    Additional Comments:  Starlyn Skeans, RN 09/23/2017, 2:47 PM

## 2017-09-24 ENCOUNTER — Ambulatory Visit: Payer: PPO | Admitting: Family

## 2017-09-24 ENCOUNTER — Inpatient Hospital Stay (HOSPITAL_COMMUNITY): Payer: PPO

## 2017-09-24 LAB — GLUCOSE, CAPILLARY
GLUCOSE-CAPILLARY: 152 mg/dL — AB (ref 65–99)
GLUCOSE-CAPILLARY: 169 mg/dL — AB (ref 65–99)
GLUCOSE-CAPILLARY: 200 mg/dL — AB (ref 65–99)
GLUCOSE-CAPILLARY: 235 mg/dL — AB (ref 65–99)
Glucose-Capillary: 157 mg/dL — ABNORMAL HIGH (ref 65–99)
Glucose-Capillary: 227 mg/dL — ABNORMAL HIGH (ref 65–99)

## 2017-09-24 LAB — CBC
HCT: 25.8 % — ABNORMAL LOW (ref 39.0–52.0)
Hemoglobin: 7.8 g/dL — ABNORMAL LOW (ref 13.0–17.0)
MCH: 29.7 pg (ref 26.0–34.0)
MCHC: 30.2 g/dL (ref 30.0–36.0)
MCV: 98.1 fL (ref 78.0–100.0)
Platelets: 181 10*3/uL (ref 150–400)
RBC: 2.63 MIL/uL — ABNORMAL LOW (ref 4.22–5.81)
RDW: 16.6 % — ABNORMAL HIGH (ref 11.5–15.5)
WBC: 7.2 10*3/uL (ref 4.0–10.5)

## 2017-09-24 LAB — BASIC METABOLIC PANEL
Anion gap: 5 (ref 5–15)
Anion gap: 7 (ref 5–15)
BUN: 53 mg/dL — ABNORMAL HIGH (ref 6–20)
BUN: 49 mg/dL — ABNORMAL HIGH (ref 6–20)
CO2: 23 mmol/L (ref 22–32)
CO2: 20 mmol/L — ABNORMAL LOW (ref 22–32)
Calcium: 7.1 mg/dL — ABNORMAL LOW (ref 8.9–10.3)
Calcium: 6.7 mg/dL — ABNORMAL LOW (ref 8.9–10.3)
Chloride: 125 mmol/L — ABNORMAL HIGH (ref 101–111)
Chloride: 113 mmol/L — ABNORMAL HIGH (ref 101–111)
Creatinine, Ser: 1.11 mg/dL (ref 0.61–1.24)
Creatinine, Ser: 1.19 mg/dL (ref 0.61–1.24)
GFR calc Af Amer: >60 mL/min (ref >60)
GFR calc Af Amer: >60 mL/min (ref >60)
GFR calc non Af Amer: >60 mL/min (ref >60)
GFR calc non Af Amer: >60 mL/min (ref >60)
Glucose, Bld: 180 mg/dL — ABNORMAL HIGH (ref 65–99)
Glucose, Bld: 391 mg/dL — ABNORMAL HIGH (ref 65–99)
Potassium: 3.7 mmol/L (ref 3.5–5.1)
Potassium: 4 mmol/L (ref 3.5–5.1)
Sodium: 153 mmol/L — ABNORMAL HIGH (ref 135–145)
Sodium: 140 mmol/L (ref 135–145)

## 2017-09-24 LAB — PROTIME-INR
INR: 1.95
Prothrombin Time: 22.1 seconds — ABNORMAL HIGH (ref 11.4–15.2)

## 2017-09-24 MED ORDER — FUROSEMIDE 10 MG/ML IJ SOLN
40.0000 mg | Freq: Once | INTRAMUSCULAR | Status: AC
  Administered 2017-09-24: 40 mg via INTRAVENOUS
  Filled 2017-09-24: qty 4

## 2017-09-24 MED ORDER — FUROSEMIDE 10 MG/ML IJ SOLN
40.0000 mg | Freq: Two times a day (BID) | INTRAMUSCULAR | Status: DC
  Administered 2017-09-24 – 2017-09-25 (×2): 40 mg via INTRAVENOUS
  Filled 2017-09-24 (×2): qty 4

## 2017-09-24 MED ORDER — FUROSEMIDE 40 MG PO TABS: 40.0000 mg | ORAL_TABLET | Freq: Every day | ORAL | Status: DC

## 2017-09-24 MED ORDER — WARFARIN SODIUM 3 MG PO TABS
3.0000 mg | ORAL_TABLET | Freq: Once | ORAL | Status: AC
  Administered 2017-09-24: 3 mg via ORAL
  Filled 2017-09-24: qty 1

## 2017-09-24 MED ORDER — DEXTROSE 5 % IV SOLN
INTRAVENOUS | Status: DC
  Administered 2017-09-24 – 2017-09-25 (×2): via INTRAVENOUS

## 2017-09-24 MED ORDER — CHLORHEXIDINE GLUCONATE 0.12 % MT SOLN
OROMUCOSAL | Status: AC
  Filled 2017-09-24: qty 15

## 2017-09-24 MED ORDER — SODIUM CHLORIDE 0.9 % IV SOLN
125.0000 mg | Freq: Once | INTRAVENOUS | Status: AC
  Administered 2017-09-24: 125 mg via INTRAVENOUS
  Filled 2017-09-24: qty 10

## 2017-09-24 MED ORDER — POTASSIUM CHLORIDE 10 MEQ/50ML IV SOLN
10.0000 meq | INTRAVENOUS | Status: AC
  Administered 2017-09-24 (×3): 10 meq via INTRAVENOUS
  Filled 2017-09-24 (×2): qty 50

## 2017-09-24 MED ORDER — POTASSIUM CHLORIDE 10 MEQ/50ML IV SOLN: 10.0000 meq | INTRAVENOUS | Status: DC | PRN

## 2017-09-24 MED ORDER — FUROSEMIDE 40 MG PO TABS: 40.0000 mg | ORAL_TABLET | Freq: Two times a day (BID) | ORAL | Status: DC

## 2017-09-24 NOTE — Progress Notes (Signed)
PULMONARY / CRITICAL CARE MEDICINE   Name: Clinton Harrington MRN: [id number] DOB: 1951/10/26    ADMISSION DATE:  08/23/2017 CONSULTATION DATE:  08/30/2017  REFERRING MD:  Cornelius Moras  CHIEF COMPLAINT:  Acute respiratory failure with hypoxemia  BRIEF:  66 y/o male with CAD who had an MI on 9/15, had severe RV failure, was treated with CABG, IABP, eventual centrimag device placed.  Post operatively was weaned off of the RVAD device on 10/1, remains mechanically ventilated.  Currently being treated for LLL HCAP. Completed 12 abx.  trached 10/10. 10/11 spiked fever and pan cultured  SUBJECTIVE:  Weaning 18/5  VITAL SIGNS: BP 122/68   Pulse 89   Temp 98.5 F (36.9 C) (Oral)   Resp (!) 29   Ht 5\' 11"  (1.803 m)   Wt 85.4 kg (188 lb 4.4 oz)   SpO2 98%   BMI 26.26 kg/m   HEMODYNAMICS: CVP:  [5 mmHg-15 mmHg] 13 mmHg  VENTILATOR SETTINGS: Vent Mode: CPAP;PSV FiO2 (%):  [30 %] 30 % Set Rate:  [16 bmp] 16 bmp Vt Set:  [600 mL] 600 mL PEEP:  [5 cmH20] 5 cmH20 Pressure Support:  [18 cmH20-20 cmH20] 18 cmH20 Plateau Pressure:  [19 cmH20-20 cmH20] 20 cmH20  INTAKE / OUTPUT: I/O last 3 completed shifts: In: 4546.6 [I.V.:366.6; NG/GT:3250; IV Piggyback:930] Out: 3465 [Urine:2790; Stool:675]  PHYSICAL EXAMINATION: General: awake, fc Neuro: weak, nonfocal, perrl HEENT: trach clean PULM: coarse CV: s1 s2 RRR no r GI: soft, BS wnl no r Extremities: mild edema      LABS:  BMET  Recent Labs Lab 09/22/17 0350 09/23/17 0351 09/24/17 0343  NA 149* 153* 153*  K 3.6 3.8 3.7  CL 119* 125* 125*  CO2 23 24 23   BUN 62* 60* 53*  CREATININE 1.49* 1.45* 1.11  GLUCOSE 367* 220* 180*    Electrolytes  Recent Labs Lab 09/18/17 0438 09/18/17 1900 09/19/17 0358  09/22/17 0350 09/23/17 0351 09/24/17 0343  CALCIUM 7.9*  --  8.1*  < > 7.0* 7.0* 7.1*  MG 2.8* 3.0* 2.9*  --   --   --   --   PHOS 3.1 2.6 2.6  --   --   --   --   < > = values in this interval not  displayed.  CBC  Recent Labs Lab 09/22/17 0350 09/23/17 0351 09/24/17 0343  WBC 11.6* 9.3 7.2  HGB 8.5* 8.1* 7.8*  HCT 28.0* 26.4* 25.8*  PLT 199 197 181    Coag's  Recent Labs Lab 09/22/17 0350 09/23/17 0351 09/24/17 0343  INR 1.76 1.90 1.95   Sepsis Markers  Recent Labs Lab 09/21/17 0847 09/22/17 0350 09/23/17 0351  PROCALCITON 0.23 0.22 0.28    ABG  Recent Labs Lab 09/17/17 1435 09/20/17 2220  PHART 7.475* 7.410  PCO2ART 38.2 36.8  PO2ART 306* 174.0*   Liver Enzymes  Recent Labs Lab 09/18/17 0438 09/19/17 0358 09/22/17 0350  AST 40 62* 141*  ALT 27 33 80*  ALKPHOS 68 73 73  BILITOT 0.8 0.8 0.7  ALBUMIN 2.7* 2.6* 2.1*   Cardiac Enzymes No results for input(s): TROPONINI, PROBNP in the last 168 hours.  Glucose  Recent Labs Lab 09/23/17 0728 09/23/17 1512 09/23/17 1955 09/23/17 2322 09/24/17 0352 09/24/17 0750  GLUCAP 194* 227* 181* 175* 157* 169*   Imaging Dg Chest Port 1 View  Result Date: 09/24/2017 CLINICAL DATA:  Tracheostomy EXAM: PORTABLE CHEST 1 VIEW COMPARISON:  09/23/2017 FINDINGS: Prior CABG. Cardiomegaly. Vascular congestion. Left infrahilar atelectasis  or infiltrate. No visible effusions. No acute bony abnormality. Support devices are stable. IMPRESSION: Cardiomegaly with vascular congestion. Left lower lobe atelectasis or infiltrate. Electronically Signed   By: Charlett Nose M.D.   On: 09/24/2017 08:05    Intake/Output Summary (Last 24 hours) at 09/24/17 0934 Last data filed at 09/24/17 0900  Gross per 24 hour  Intake           3396.2 ml  Output             1700 ml  Net           1696.2 ml    CULTURES: 9/22 sputum>>>ng 9/24>> Sputum>>ng 9/24 Blood>>NEG  9/24 Urine>>ng 9/28 BC x 2 >coag negative staph 1/4 10/2>> respiratory flora, candida albicans 10/11 bc x 2>> 10/13 bc>> 10/11 Sputum>>Staph coag neg 10/13 c dif>>neg 10/11 UC>>neg  ANTIBIOTICS: zinacef >>>9/22- 9/24 ceftaz 9/22>>>9/28 Vanc 9/20>>  off Zosyn 9/28 >>10/11 10/11 fortaz>>off 10/14 vanc>>>10/17 10/11 diflucan>>off 1015 Eraxis>>10/17 10/15 meropenem>>>   SIGNIFICANT EVENTS: 08/23/2017 - admit 9/22 - intubated Cath cabg RV assist device 9/23 - On epi/levo/RVAD/vent.  CI 1.8 despite all interventions 9/24>> Epi, Levo, milrinine,amio,insulin gtt, Lasix gtt, Heparin, Fent, Versed 9/26 - Remains sedated on fent/ versed drips RVAD flows down to 3200 Good UO on lasix gtt @ 15 9/27 - 40% fio2, peep 8. RPM on RVAD turned down. Stil on levophed, milrinone, epi ggtt and heparin.admio gtt. Making urine. Has chest draines. Good urine outoput. Heparin on hold briefly before resumptions due to bleed in groin site. Getting 2 UI PRBC 9/30 mucous plugging requiring multiple lavages 10/1 RV assist device  removed  in the OR 2/2 bleeding 1-0/2 - Stable off RVAD, remains on levo, vaso,Epi, milrinone 10/8 self extubated 10/9 intubated 10/10 trach  STUDIES: 10/6 CT head > NAICP, may need MRI to assess further 10/15 ct chest rt ll pna Lines/tubes: 10/1 R subclavian CVL > dc ETT out 10/8, self extubation 10/9 ETT>>10/10 Trach 10/10>> 10/12 rt picc>>  DISCUSSION: 66 y/o male s/p MI, RV failure, CABG, centrimag RV support deviced (now weaned off) who remains intubated for respiratory failure.  Currently being treated for HCAP.  10/9 post 24 hr self extubation and may need intubation. Re intubated 10/9 and  trached 10/10. 10/11 on t collar. Will need to pursue LTAC placement. 10/11 placed on abx per CVTS.  ASSESSMENT / PLAN:  PULMONARY A: Acute respiratory failure with hypoxemia > improving HCAP > infiltrate worse on left Self extubated 10/8 0300 10/9 remains tenuous, requiring NTS for poor cough mechanics  10/9 re intubated for safety  10/10 trached 10/11 on t collar 10/16 remove trach sutures Weaning impeded due to fevers and weakness CT chest 10/15 small semi loculated effusion, no clear pulmonary reason on CT chest  for poor wenaing P:   Wean PS 15-18 , would tolerate volumes 300, rate 30 with good mental status Even balance goals, needs lasix to this goal, will follow output from this dose pcxr I reviewed shows new pulm edema Doppler neg legs, with low clinical suspicion, also was on heparin when poor weaning started, so lower suspicion   CARDIOVASCULAR Lab Results  Component Value Date   INR 1.95 09/24/2017   INR 1.90 09/23/2017   INR 1.76 09/22/2017    A:  Cardiogenic shock > on dopamine  per cvts CAD, s/p MI and CABG RV failure, off assist devices PAF P:  arrhythmia meds per cardiology TCTS Tele Heparin/coumadin per pharmacy per pharm dopmaine to CVTS goals  RENAL Lab Results  Component Value Date   CREATININE 1.11 09/24/2017   CREATININE 1.45 (H) 09/23/2017   CREATININE 1.49 (H) 09/22/2017    Recent Labs Lab 09/22/17 0350 09/23/17 0351 09/24/17 0343  K 3.6 3.8 3.7    Recent Labs Lab 09/22/17 0350 09/23/17 0351 09/24/17 0343  NA 149* 153* 153*     A:   AKI>  Stable hypernatremia P:   Lasix required and likely needs more Add d5w, unlikely to get Na correction with free water bmet in pm for Na  GASTROINTESTINAL A:   diarrhea Note ct abd 10/15 left inguinal hernia with portion of bladder  P:   Continue tube feeding  HEMATOLOGIC  Recent Labs  09/23/17 0351 09/24/17 0343  HGB 8.1* 7.8*   Lab Results  Component Value Date   INR 1.95 09/24/2017   INR 1.90 09/23/2017   INR 1.76 09/22/2017    A: full anticoagulation - afib P:  Coumadin, INR to follow Doppler neg  INFECTIOUS A:   HCAP, fever on 10/6 Coag neg staph in blood> most likely contaminant 10/11 fever spike 102.4 10/15 102 and broad spec abx added 10/15 ct chest rt multifocal asdx cw effusion small loculated, no sig PNA P:   Remains culture neg Dc vanc Dc eraxis Sputum candidia on 10/7 likely was NOT a pathogen Maintain meropenem  ENDOCRINE CBG (last 3)   Recent Labs   09/23/17 2322 09/24/17 0352 09/24/17 0750  GLUCAP 175* 157* 169*     A:   DM2, persistent hyperglycemia   P:   Continue SSI lantus  We are in NICE range  NEUROLOGIC A:   10/5 New left sided weakness> resolved  10/6 10/9 weak but follows commands 10/9 reintubation therefore back on sedation till trache and weaning resumes. Trached 10/10 and off sedation 10/12 very weak 10/16 remains weak but follows commands P:    pt important Avoid scheduled benzo  FAMILY  - Updates:  Patient updated bedside by me  Ccm time 30 min   Mcarthur Rossettianiel J. Tyson AliasFeinstein, MD, FACP Pgr: [phone] [facility] 09/24/2017 9:34 AM

## 2017-09-24 NOTE — Progress Notes (Signed)
Pharmacy Antibiotic Note  Clinton Harrington is a 66 y.o. male admitted on 08/23/2017 with fevers.  Pharmacy has been consulted for vancomycin dosing. Pt has had persistent fevers and ABX have been broadened to Eraxis and Merrem per MD. Afebrile overnight, WBC wnl, cultures negative.  Plan: -Continue vancomycin 557m IV q12h -Continue Eraxis 1038mIV daily -Continue Merrem 1g IV q8h -Monitor LOT, renal funx, VT as indicated  Height: _0  (180.3 cm) Weight: 188 lb 4.4 oz (85.4 kg) IBW/kg (Calculated) : 75.3  Temp (24hrs), Avg:99.1 F (37.3 C), Min:98.7 F (37.1 C), Max:99.5 F (37.5 C)   Recent Labs Lab 09/20/17 0406 09/21/17 0354 09/21/17 1530 09/22/17 0100 09/22/17 0350 09/23/17 0351 09/24/17 0343  WBC 8.9 10.9*  --  11.3* 11.6* 9.3 7.2  CREATININE 1.50* 1.52*  --   --  1.49* 1.45* 1.11  VANCOTROUGH  --   --  22*  --   --   --   --     Estimated Creatinine Clearance: 72.5 mL/min (by C-G formula based on SCr of 1.11 mg/dL).    Allergies  Allergen Reactions  . Aspirin Other (See Comments)    Stomach upset  . Latex Rash  . Oxycodone-Acetaminophen Itching    9/23 VT 16>>continue 1g q12 9/26 VT 24>>1500 q24 9/29 VT 18>>1500 q24 10/1 *extra 1250 mg given in OR* 10/5 VT 24 > 1250 q24h - d/c'd 10/14 VT = 22 > 500 q12h  Antimicrobials this admission:  10/15 Meropenem >> 10/15 Eraxis >> 9/20 Vancomycin >> 10/5; 10/11 >> 9/20 Cefuroxime >> 9/22 9/22 Ceftazidime >>9/28; 10/11 >> 10/15 9/28 Zosyn>> 10/11 10/3 Fluconazole >> 10/8; 10/11 >>10/13  Microbiology results: 10/13 TA: neg 10/13 C.diff - neg 10/13 BCx x 2: NGTD 10/11 BCx: NGTD 10/11 UCx: neg 10/11 TA: rare coag neg staph 10/7 Resp Cx: few yeast 10/3 BCx: neg 10/2 BAL: ng 10/2 TA: ng 10/1 Sternal wound Cx: ng 9/28 bld x2: 1o2 MR-CoNS 9/24 bld x2: ngF 9/24 UCx: ng 9/24 Sputum: ng 9/20 and 9/22 Sputum: neg   MiArrie SenatePharmD PGY-2 Cardiology Pharmacy Resident Pager:  [phone]/17/2018

## 2017-09-24 NOTE — Progress Notes (Signed)
Patient ID: Clinton HammockReginald E Harrington, male   DOB: Jan 31, 1951, 66 y.o.   MRN: [id number] EVENING ROUNDS NOTE :     [address]       [place] [id number]             [phone]                 16 Days Post-Op Procedure(s) (LRB): REMOVAL OF R-VENTRICULAR ASSIST DEVICE WITH CARDIOPULMONARY BYPASS (N/A) TRANSESOPHAGEAL ECHOCARDIOGRAM (TEE) (N/A)  Total Length of Stay:  LOS: 32 days  BP (!) 142/81   Pulse 97   Temp 99.3 F (37.4 C) (Oral)   Resp (!) 28   Ht 5\' 11"  (1.803 m)   Wt 188 lb 4.4 oz (85.4 kg)   SpO2 99%   BMI 26.26 kg/m   .Intake/Output      10/16 0701 - 10/17 0700 10/17 0701 - 10/18 0700   I.V. (mL/kg) 233.4 (2.7) 509.2 (6)   NG/GT 2450 600   IV Piggyback 730 360   Total Intake(mL/kg) 3413.4 (40) 1469.2 (17.2)   Urine (mL/kg/hr) 1810 (0.9) 1215 (1.2)   Stool 175    Total Output 1985 1215   Net +1428.4 +254.2          . sodium chloride 10 mL/hr at 09/24/17 1800  . dextrose 50 mL/hr at 09/24/17 1800  . DOPamine Stopped (09/23/17 1600)  . feeding supplement (VITAL 1.5 CAL) 1,000 mL (09/24/17 1800)  . ferric gluconate (FERRLECIT/NULECIT) IV Stopped (09/24/17 1849)  . meropenem (MERREM) IV Stopped (09/24/17 1332)  . potassium chloride 10 mEq (09/22/17 21300613)     Lab Results  Component Value Date   WBC 7.2 09/24/2017   HGB 7.8 (L) 09/24/2017   HCT 25.8 (L) 09/24/2017   PLT 181 09/24/2017   GLUCOSE 180 (H) 09/24/2017   CHOL 126 08/24/2017   TRIG 240 (H) 08/24/2017   HDL 31 (L) 08/24/2017   LDLDIRECT 128.6 10/30/2012   LDLCALC 47 08/24/2017   ALT 80 (H) 09/22/2017   AST 141 (H) 09/22/2017   NA 153 (H) 09/24/2017   K 3.7 09/24/2017   CL 125 (H) 09/24/2017   CREATININE 1.11 09/24/2017   BUN 53 (H) 09/24/2017   CO2 23 09/24/2017   TSH 0.722 08/25/2017   INR 1.95 09/24/2017   HGBA1C 5.9 (H) 08/25/2017   MICROALBUR 30.9 (H) 10/30/2012   Up to chair today, more responsive and alert    Delight OvensEdward B Corleone Biegler MD  Beeper [phone] Office  [phone] 09/24/2017 6:40 PM

## 2017-09-24 NOTE — Progress Notes (Signed)
Patient ID: Clinton Harrington, male   DOB: 23-Jun-1951, 66 y.o.   MRN: [id number]        Advanced Heart Failure Rounding Note   Subjective:    Events CABG x4 IABP Centrimag 9/20    IABP removed 9/24.  Centrimag RVAD in place DC-CV successful 9/29  Centrimag removal 10/1 Self-extubated 10/8 Re-intubated 10/9 S/P Tach 10/10 Recultured 10/11   Failed trach wean yesterday. A febrile over night.   ECHO 09/17/2017  Left ventricle: The cavity size was normal. There was moderate   concentric hypertrophy. Systolic function was normal. The   estimated ejection fraction was in the range of 60% to 65%.   Although no diagnostic regional wall motion abnormality was   identified, this possibility cannot be completely excluded on the   basis of this study. Doppler parameters are consistent with   abnormal left ventricular relaxation (grade 1 diastolic   dysfunction). Doppler parameters are consistent with   indeterminate mean left atrial filling pressure. - Ventricular septum: Septal motion showed paradox. - Aortic valve: There was mild stenosis. Valve area (VTI): 1.99   cm^2. Valve area (Vmax): 1.99 cm^2. Valve area (Vmean): 1.99   cm^2. - Right ventricle: Systolic function was moderately reduced. Impressions: - Compared to 09/05/2017, the right ventricle appears less dilated   and there is some improvement in RV function.   Objective:   Weight Range: 188 lb 4.4 oz (85.4 kg) Body mass index is 26.26 kg/m.   Vital Signs:   Temp:  [98.7 F (37.1 C)-99.8 F (37.7 C)] 99.2 F (37.3 C) (10/17 0311) Pulse Rate:  [87-98] 87 (10/17 0700) Resp:  [18-35] 25 (10/17 0700) BP: (91-137)/(55-81) 103/64 (10/17 0700) SpO2:  [95 %-100 %] 98 % (10/17 0700) FiO2 (%):  [30 %] 30 % (10/17 0400) Weight:  [188 lb 4.4 oz (85.4 kg)] 188 lb 4.4 oz (85.4 kg) (10/17 0400) Last BM Date: 09/23/17  Weight change: Filed Weights   09/22/17 0402 09/23/17 0500 09/24/17 0400  Weight: 184 lb 8.4 oz  (83.7 kg) 188 lb 4.4 oz (85.4 kg) 188 lb 4.4 oz (85.4 kg)    Intake/Output:   Intake/Output Summary (Last 24 hours) at 09/24/17 0726 Last data filed at 09/24/17 0601  Gross per 24 hour  Intake           3313.4 ml  Output             1985 ml  Net           1328.4 ml      Physical Exam   General:  No resp difficulty. In bed.  HEENT: Trach Neck: Trach. Supple  no JVD. Carotids 2+ bilat; no bruits. No lymphadenopathy or thryomegaly appreciated. Cor: PMI nondisplaced. Regular rate & rhythm. No rubs, gallops or murmurs. Lungs: Rhonchi throughout Abdomen: soft, nontender, nondistended. No hepatosplenomegaly. No bruits or masses. Good bowel sounds. Extremities: no cyanosis, clubbing, rash, Rand LLE heel boot.  Neuro: alert & orientedx3, cranial nerves grossly intact. moves all 4 extremities w/o difficulty. Affect pleasant Skin: sacral dressing intact   Telemetry   NSR 70-80s   Labs    CBC  Recent Labs  09/23/17 0351 09/24/17 0343  WBC 9.3 7.2  HGB 8.1* 7.8*  HCT 26.4* 25.8*  MCV 97.4 98.1  PLT 197 696   Basic Metabolic Panel  Recent Labs  09/23/17 0351 09/24/17 0343  NA 153* 153*  K 3.8 3.7  CL 125* 125*  CO2 24 23  GLUCOSE 220* 180*  BUN 60* 53*  CREATININE 1.45* 1.11  CALCIUM 7.0* 7.1*   Liver Function Tests  Recent Labs  09/22/17 0350  AST 141*  ALT 80*  ALKPHOS 73  BILITOT 0.7  PROT 6.6  ALBUMIN 2.1*   No results for input(s): LIPASE, AMYLASE in the last 72 hours. Cardiac Enzymes No results for input(s): CKTOTAL, CKMB, CKMBINDEX, TROPONINI in the last 72 hours.  BNP: BNP (last 3 results)  Recent Labs  08/23/17 1927  BNP 200.0*    ProBNP (last 3 results) No results for input(s): PROBNP in the last 8760 hours.   D-Dimer No results for input(s): DDIMER in the last 72 hours. Hemoglobin A1C No results for input(s): HGBA1C in the last 72 hours. Fasting Lipid Panel No results for input(s): CHOL, HDL, LDLCALC, TRIG, CHOLHDL, LDLDIRECT  in the last 72 hours. Thyroid Function Tests No results for input(s): TSH, T4TOTAL, T3FREE, THYROIDAB in the last 72 hours.  Invalid input(s): FREET3  Other results:   Imaging    No results found.   Medications:     Scheduled Medications: . atorvastatin  80 mg Per Tube q1800  . brimonidine  1 drop Right Eye BID  . budesonide (PULMICORT) nebulizer solution  0.25 mg Nebulization BID  . chlorhexidine gluconate (MEDLINE KIT)  15 mL Mouth Rinse BID  . Chlorhexidine Gluconate Cloth  6 each Topical Daily  . collagenase   Topical Daily  . feeding supplement (PRO-STAT SUGAR FREE 64)  30 mL Per Tube TID  . free water  400 mL Per Tube Q6H  . insulin aspart  0-24 Units Subcutaneous Q4H  . insulin glargine  30 Units Subcutaneous BID  . levalbuterol  1.25 mg Nebulization Q6H  . mouth rinse  15 mL Mouth Rinse QID  . mupirocin cream   Topical BID  . pantoprazole sodium  40 mg Per Tube Daily  . sodium chloride flush  10-40 mL Intracatheter Q12H  . sodium chloride flush  3 mL Intravenous Q12H  . spironolactone  12.5 mg Per Tube Daily  . warfarin  3 mg Oral ONCE-1800  . Warfarin - Pharmacist Dosing Inpatient   Does not apply q1800    Infusions: . sodium chloride 10 mL/hr at 09/22/17 0600  . anidulafungin (ERAXIS) 100 mg IVPB Stopped (09/23/17 1129)  . DOPamine Stopped (09/23/17 1600)  . feeding supplement (VITAL 1.5 CAL) 1,000 mL (09/23/17 0800)  . meropenem (MERREM) IV Stopped (09/24/17 0534)  . potassium chloride 10 mEq (09/22/17 4451)  . potassium chloride    . vancomycin 500 mg (09/24/17 0601)    PRN Medications: acetaminophen (TYLENOL) oral liquid 160 mg/5 mL, fentaNYL (SUBLIMAZE) injection, metoprolol tartrate, midazolam, ondansetron (ZOFRAN) IV, oxyCODONE, potassium chloride, sodium chloride flush, sodium chloride flush    Patient Profile   Clinton Harrington is a 66 year old with a history of DMII, smoker, CAD S/P PCI RCA 20 years ago, HTN, PAD, and hyperlipidemia admitted with  CP and inferior MI with RV infarction.  He underwent LHC with IABP. After optimized, underwent CABG x4 centrimag for RV infarct on 9/20 and continued IABP.   Assessment/Plan   1. Post-cardiotomy cardiogenic shock: Primarily RV failure from inferior infarct with RV involvement.  Repeat echo on 9/24 showed vigorous LV systolic function with moderate RV dysfunction.  Now s/p CABG and Centrimag for RV support on 9/20. IABP removed 9/24 with good LV function.  Centrimag out 10/1.  ECHO 09/17/2017 EF 60-65%, grade I DD, RV with some improvement/less dilated.   Volume status  stable.  Off dopamine. SBP remains soft, suspect septic/vasogenic shock.  - Continue 12.5 mg spiro daily.  2. CAD: Inferior MI with RV involvement at presentation, now s/p CABG.   - Continue asa to 81 mg daily.  - Continue statin.  3. Atrial fibrillation/flutter: DCCV 9/29, now remains in NSR.  - Continue amiodarone per tube. Continue amio 200 mg twice a day,  - On warfarin.  INR 1.95  4. AKI: creatinine 1.11 5. Acute respiratory failure: Suspect left-sided HCAP. Self-extubated 09/15/17. Had resp distress overnight into 09/16/17, remained weak requiring re-intubation.  S/P Trach 09/17/2017. On 40%.  - Per respiratory therapy. Failed wean yesterday. - Abx broadened.  6. Neuro: ? L-sided weakness now resolved.  Head CT 10/5 negative.  Resolved.   7. ID: RLL PNA on chest CT 10/15, HCAP.  Suspect hypotension is septic/vasogenic shock.  Has pressure ulcer that could be source as well.  - Now on meropenem, vancomycin, anidulafungin.   8. Anemia: Received 2 uPRBCs 09/04/17. 4 units PRBCs on 9/29. Received 2U PRBCs 10/1.    He received IV Fe on 10/11.  Hgb 7.8 .  10. Unstageable Pressure Ulcer - R Buttock partial thickness. Left buttock with slough. Continue santyl to left buttock for selective debridement.  - Continue reposition. On tube feeds. WOC to reassess today. PT to start hydrotherapy.  11. Diabetes: Better controlled with addition  of lantus.  12. Deconditioning: PT consulted. CIR recommended.  ? LTAC 13. Hypernatremia: Sodium unchanged 153. Continue free water.  14. Urinary Retention- Requiring I&O cath. Darrick Grinder, NP  09/24/2017 7:26 AM   Advanced Heart Failure Team Pager [phone] (M-F; 7a - 4p)  Please contact [facility] for night-coverage after hours (4p -7a ) and weekends on [email or web address]  Patient seen with NP, agree with the above note.   Patient now afebrile and off dopamine.  On broad spectrum abx for suspected HCAP.  Failed trach collar wean.   CVP around 10, weight stable at 188 lbs.  Would give dose of Lasix 40 mg IV x 1 today and start Lasix 40 mg po daily tomorrow.   Loralie Champagne 09/24/2017 8:23 AM

## 2017-09-24 NOTE — Progress Notes (Signed)
ANTICOAGULATION CONSULT NOTE  Pharmacy Consult for heparin/Coumadin Indication: afib, s/p DCCV 9/29  Allergies  Allergen Reactions  . Aspirin Other (See Comments)    Stomach upset  . Latex Rash  . Oxycodone-Acetaminophen Itching    Patient Measurements: Height: 5\' 11"  (180.3 cm) Weight: 188 lb 4.4 oz (85.4 kg) IBW/kg (Calculated) : 75.3  Vital Signs: Temp: 99.2 F (37.3 C) (10/17 0311) Temp Source: Oral (10/17 0311) BP: 116/66 (10/17 0500) Pulse Rate: 90 (10/17 0500)  Labs:  Recent Labs  09/21/17 0737 09/21/17 1530  09/22/17 0350 09/23/17 0351 09/24/17 0343  HGB  --   --   < > 8.5* 8.1* 7.8*  HCT  --   --   < > 28.0* 26.4* 25.8*  PLT  --   --   < > 199 197 181  LABPROT  --   --   --  20.3* 21.6* 22.1*  INR  --   --   --  1.76 1.90 1.95  HEPARINUNFRC 0.74* 0.63  --  0.67  --   --   CREATININE  --   --   --  1.49* 1.45* 1.11  < > = values in this interval not displayed.  Estimated Creatinine Clearance: 72.5 mL/min (by C-G formula based on SCr of 1.11 mg/dL).   Assessment: 89 yoM with post/op AFib s/p DCCV continues on warfarin per pharmacy. Heparin bridge discontinued per TCTS yesterday. INR continues to trend up to 1.95, CBC low but stable, dopplers negative for DVT.  Goal of Therapy:  INR 2-3 Monitor platelets by anticoagulation protocol: Yes   Plan: -Coumadin 3 mg PO x1 again tonight -Daily  INR  Fredonia HighlandMichael Sequoyah Counterman, PharmD PGY-2 Cardiology Pharmacy Resident Pager: [phone] 09/24/2017

## 2017-09-24 NOTE — Progress Notes (Signed)
Physical Therapy Treatment Patient Details Name: Clinton Harrington MRN: [id number] DOB: 24-Apr-1951 Today's Date: 09/24/2017    History of Present Illness Patient is a 66 y/o Caucasian M with hx CADs/p BMS to RCA in 1999 (due to an inferior STEMI), significant PVD, HTN, Type 2 DM (non-insulin dependent), HLP, Tobacco use comes in today complaining of substernal chest pain that started last night at 11 pm. He had associated SOB, nausea, diaphoresis. Pt underwent CABGx4 on 9/20. Pt then underwent removal of R VAD with cardiopulmonary bypass on 10/1. Pt self extubated on 10/8. reintubated 09/17/17    PT Comments    Pt admitted with above diagnosis. Pt currently with functional limitations due to balance and endurance deficits. Pt was unable to participate much due to profound weakness.  Total assist of 2 to come to EOB.  Total to min assist for brief periods of time to sit EOB 10 minutes with pt with poor trunk stability. Total assist to scoot pt to chair.  Hopeful that mobility can be progressed with tilt bed (has been ordered).   Pt will benefit from skilled PT to increase their independence and safety with mobility to allow discharge to the venue listed below.     Follow Up Recommendations  CIR;Supervision/Assistance - 24 hour     Equipment Recommendations   (TBD)    Recommendations for Other Services Rehab consult     Precautions / Restrictions Precautions Precautions: Sternal Precaution Comments: pt educated on sternal precautions however unable to recall  Restrictions Weight Bearing Restrictions: Yes Other Position/Activity Restrictions: sternal precautions    Mobility  Bed Mobility Overal bed mobility: Needs Assistance Bed Mobility: Supine to Sit;Sit to Supine     Supine to sit: Max assist;+2 for physical assistance;Total assist     General bed mobility comments:   pt with poor initiation  of movement and needed max assist to bring LEs on/off bed, tot to max A for trunk  elevation  Transfers Overall transfer level: Needs assistance Equipment used:  (2 person lateral scoot with bed pad) Transfers: Lateral/Scoot Transfers          Lateral/Scoot Transfers: +2 physical assistance;Total assist;+2 safety/equipment;From elevated surface General transfer comment: Used pad to scoot pt but pt unable to participate actively at all.  Took 3 persons to get pt in chair in good position.  Talked with nurse that PT recommends use of tilt bed for increasing pts' mobility.  Nurse facilitated ordering the tilt bed and hopeful that treatment can be initiated with the bed next treatment.  Pt will need to be hoyer lifted OOB to chair at this time if OOB desired as this is the safest way for OOB to chair. RN aware.  Ambulation/Gait             General Gait Details: currently unable   Stairs            Wheelchair Mobility    Modified Rankin (Stroke Patients Only)       Balance Overall balance assessment: Needs assistance Sitting-balance support: Feet supported;Bilateral upper extremity supported;No upper extremity supported Sitting balance-Leahy Scale: Poor Sitting balance - Comments: pt sat EOB x 10 min with maxA to min assist  to maintain upright position. Pt's RR increased to 41 rrm. Pt unable to hold head up >5 seconds. pt with strong forward lean without protective responses at times.  Pt having a difficult time with midline.  Postural control: Left lateral lean;Right lateral lean;Other (comment) (anterior lean) Standing balance support:  (  unable to achieve)                                Cognition Arousal/Alertness: Awake/alert Behavior During Therapy: WFL for tasks assessed/performed Overall Cognitive Status: Impaired/Different from baseline Area of Impairment: Following commands;Problem solving                       Following Commands: Follows one step commands with increased time     Problem Solving: Slow  processing;Decreased initiation;Difficulty sequencing;Requires verbal cues;Requires tactile cues General Comments: pt able to communicate with head nodding yes/no, followed commands majority of time, waved goodbye      Exercises General Exercises - Upper Extremity Shoulder Flexion: AROM;AAROM;Both;Seated;10 reps (able to lift R arm 6 inches and L arm 2 inches) General Exercises - Lower Extremity Long Arc Quad: AROM;AAROM;5 reps;Both;Seated (AROM on R, AAROM on Left, some activation on L but very weak) Heel Slides: AAROM;Both;5 reps;Supine    General Comments General comments (skin integrity, edema, etc.): 30% FiO2, PEEP 5, RR 36-44 with activity.  BP intially 118/67, 99/73 in sitting EOB, 101/64 once in chair.       Pertinent Vitals/Pain Pain Assessment: Faces Faces Pain Scale: Hurts even more Pain Location: generalized Pain Descriptors / Indicators: Aching;Guarding Pain Intervention(s): Limited activity within patient's tolerance;Monitored during session;Repositioned    Home Living                      Prior Function            PT Goals (current goals can now be found in the care plan section) Progress towards PT goals: Progressing toward goals    Frequency    Min 3X/week      PT Plan Current plan remains appropriate    Co-evaluation              AM-PAC PT "6 Clicks" Daily Activity  Outcome Measure  Difficulty turning over in bed (including adjusting bedclothes, sheets and blankets)?: Unable Difficulty moving from lying on back to sitting on the side of the bed? : Unable Difficulty sitting down on and standing up from a chair with arms (e.g., wheelchair, bedside commode, etc,.)?: Unable Help needed moving to and from a bed to chair (including a wheelchair)?: Total Help needed walking in hospital room?: Total Help needed climbing 3-5 steps with a railing? : Total 6 Click Score: 6    End of Session Equipment Utilized During Treatment:  (trach on  vent) Activity Tolerance: Patient limited by fatigue Patient left: with call bell/phone within reach;with nursing/sitter in room;in chair Nurse Communication: Mobility status;Need for lift equipment PT Visit Diagnosis: Unsteadiness on feet (R26.81);Muscle weakness (generalized) (M62.81);Difficulty in walking, not elsewhere classified (R26.2);Other abnormalities of gait and mobility (R26.89)     Time: 1610-96041204-1225 PT Time Calculation (min) (ACUTE ONLY): 21 min  Charges:  $Therapeutic Activity: 8-22 mins                    G Codes:       Clinton Harrington,PT Acute Rehabilitation [phone] (pager)    Clinton Harrington 09/24/2017, 1:45 PM

## 2017-09-24 NOTE — Progress Notes (Signed)
16 Days Post-Op Procedure(s) (LRB): REMOVAL OF R-VENTRICULAR ASSIST DEVICE WITH CARDIOPULMONARY BYPASS (N/A) TRANSESOPHAGEAL ECHOCARDIOGRAM (TEE) (N/A) Subjective: Temperature curve improved with broadening antibiotics to include meropenem and  Eraxis to cover probable left lower lobe pneumonia Status post tracheostomy but then weaned has been prolonged due topre-existing severe COPD Patient be mobilized by physical therapy Patient reviewed for LTAC but turned down as inadequate candidate Physical therapy recommends CIR evaluation when patient can be liberated from the ventilator Continuing tube feeds Maintained sinus rhythm Chest x-ray remains fairly clear  Objective: Vital signs in last 24 hours: Temp:  [98.5 F (36.9 C)-99.5 F (37.5 C)] 98.8 F (37.1 C) (10/17 1142) Pulse Rate:  [87-96] 94 (10/17 1400) Cardiac Rhythm: Normal sinus rhythm (10/17 1200) Resp:  [18-41] 27 (10/17 1400) BP: (91-137)/(55-81) 105/72 (10/17 1400) SpO2:  [91 %-100 %] 99 % (10/17 1400) FiO2 (%):  [30 %] 30 % (10/17 1352) Weight:  [188 lb 4.4 oz (85.4 kg)] 188 lb 4.4 oz (85.4 kg) (10/17 0400)  Hemodynamic parameters for last 24 hours: CVP:  [8 mmHg-15 mmHg] 12 mmHg  Intake/Output from previous day: 10/16 0701 - 10/17 0700 In: 3413.4 [I.V.:233.4; NG/GT:2450; IV Piggyback:730] Out: 1985 [Urine:1810; Stool:175] Intake/Output this shift: Total I/O In: 919.2 [I.V.:269.2; NG/GT:400; IV Piggyback:250] Out: 1015 [Urine:1015]       Exam    General- alert and comfortable   Lungs- clear without rales, wheezes   Cor- regular rate and rhythm, no murmur , gallop   Abdomen- soft, non-tender   Extremities - warm, non-tender, minimal edema   Neuro- oriented, appropriate, no focal weakness   Lab Results:  Recent Labs  09/23/17 0351 09/24/17 0343  WBC 9.3 7.2  HGB 8.1* 7.8*  HCT 26.4* 25.8*  PLT 197 181   BMET:  Recent Labs  09/23/17 0351 09/24/17 0343  NA 153* 153*  K 3.8 3.7  CL 125* 125*   CO2 24 23  GLUCOSE 220* 180*  BUN 60* 53*  CREATININE 1.45* 1.11  CALCIUM 7.0* 7.1*    PT/INR:  Recent Labs  09/24/17 0343  LABPROT 22.1*  INR 1.95   ABG    Component Value Date/Time   PHART 7.410 09/20/2017 2220   HCO3 22.8 09/20/2017 2220   TCO2 24 09/20/2017 2220   ACIDBASEDEF 1.0 09/20/2017 2220   O2SAT 100.0 09/20/2017 2220   CBG (last 3)   Recent Labs  09/24/17 0352 09/24/17 0750 09/24/17 1148  GLUCAP 157* 169* 227*    Assessment/Plan: S/P Procedure(s) (LRB): REMOVAL OF R-VENTRICULAR ASSIST DEVICE WITH CARDIOPULMONARY BYPASS (N/A) TRANSESOPHAGEAL ECHOCARDIOGRAM (TEE) (N/A) Continue vent wean 10 day course of broad-spectrum antibiotics-his temperature curve shows improvement after several days of high fever Postop expected blood loss anemia, observe and treat with IV iron   LOS: 32 days    Clinton Harrington 09/24/2017

## 2017-09-24 NOTE — Progress Notes (Addendum)
Discharge Planning: [facility] was  not approved. Attending can call for a P2P, Dr Shon HoughStephan Evans #[phone]. Notified attending and information provided. Decision has to made by 09/25/2017 if P2P will be completed. LTAC referral will closed on 09/26/2017. Isidoro DonningAlesia Shawntee Mainwaring RN CCM Case Mgmt phone [phone]

## 2017-09-25 LAB — BASIC METABOLIC PANEL
Anion gap: 5 (ref 5–15)
Anion gap: 7 (ref 5–15)
BUN: 50 mg/dL — ABNORMAL HIGH (ref 6–20)
BUN: 50 mg/dL — AB (ref 6–20)
CHLORIDE: 116 mmol/L — AB (ref 101–111)
CO2: 23 mmol/L (ref 22–32)
CO2: 23 mmol/L (ref 22–32)
CREATININE: 1.08 mg/dL (ref 0.61–1.24)
Calcium: 7.2 mg/dL — ABNORMAL LOW (ref 8.9–10.3)
Calcium: 7.1 mg/dL — ABNORMAL LOW (ref 8.9–10.3)
Chloride: 123 mmol/L — ABNORMAL HIGH (ref 101–111)
Creatinine, Ser: 1.07 mg/dL (ref 0.61–1.24)
GFR calc Af Amer: >60 mL/min (ref >60)
GFR calc Af Amer: >60 mL/min (ref >60)
GFR calc non Af Amer: >60 mL/min (ref >60)
GFR calc non Af Amer: >60 mL/min (ref >60)
Glucose, Bld: 152 mg/dL — ABNORMAL HIGH (ref 65–99)
Glucose, Bld: 217 mg/dL — ABNORMAL HIGH (ref 65–99)
Potassium: 4.1 mmol/L (ref 3.5–5.1)
Potassium: 4.4 mmol/L (ref 3.5–5.1)
SODIUM: 146 mmol/L — AB (ref 135–145)
Sodium: 151 mmol/L — ABNORMAL HIGH (ref 135–145)

## 2017-09-25 LAB — CULTURE, BLOOD (ROUTINE X 2)
CULTURE: NO GROWTH
Culture: NO GROWTH
SPECIAL REQUESTS: ADEQUATE
Special Requests: ADEQUATE

## 2017-09-25 LAB — GLUCOSE, CAPILLARY
GLUCOSE-CAPILLARY: 163 mg/dL — AB (ref 65–99)
GLUCOSE-CAPILLARY: 139 mg/dL — AB (ref 65–99)
GLUCOSE-CAPILLARY: 200 mg/dL — AB (ref 65–99)
Glucose-Capillary: 161 mg/dL — ABNORMAL HIGH (ref 65–99)
Glucose-Capillary: 163 mg/dL — ABNORMAL HIGH (ref 65–99)
Glucose-Capillary: 222 mg/dL — ABNORMAL HIGH (ref 65–99)

## 2017-09-25 LAB — PHOSPHORUS: Phosphorus: 3 mg/dL (ref 2.5–4.6)

## 2017-09-25 LAB — CBC
HCT: 26.1 % — ABNORMAL LOW (ref 39.0–52.0)
Hemoglobin: 7.8 g/dL — ABNORMAL LOW (ref 13.0–17.0)
MCH: 29.5 pg (ref 26.0–34.0)
MCHC: 29.9 g/dL — ABNORMAL LOW (ref 30.0–36.0)
MCV: 98.9 fL (ref 78.0–100.0)
Platelets: 180 10*3/uL (ref 150–400)
RBC: 2.64 MIL/uL — ABNORMAL LOW (ref 4.22–5.81)
RDW: 17.1 % — ABNORMAL HIGH (ref 11.5–15.5)
WBC: 6.5 10*3/uL (ref 4.0–10.5)

## 2017-09-25 LAB — PROTIME-INR
INR: 2.13
Prothrombin Time: 23.7 seconds — ABNORMAL HIGH (ref 11.4–15.2)

## 2017-09-25 LAB — MAGNESIUM: Magnesium: 3 mg/dL — ABNORMAL HIGH (ref 1.7–2.4)

## 2017-09-25 MED ORDER — SODIUM CHLORIDE 0.9 % IV SOLN
100.0000 mg | Freq: Every day | INTRAVENOUS | Status: DC
  Administered 2017-09-25 – 2017-09-26 (×2): 100 mg via INTRAVENOUS
  Filled 2017-09-25 (×2): qty 100

## 2017-09-25 MED ORDER — WARFARIN SODIUM 3 MG PO TABS
3.0000 mg | ORAL_TABLET | Freq: Once | ORAL | Status: AC
  Administered 2017-09-25: 3 mg via ORAL
  Filled 2017-09-25: qty 1

## 2017-09-25 MED ORDER — SODIUM CHLORIDE 0.9 % IV SOLN
500.0000 mg | Freq: Two times a day (BID) | INTRAVENOUS | Status: DC
  Administered 2017-09-25 – 2017-09-26 (×3): 500 mg via INTRAVENOUS
  Filled 2017-09-25 (×4): qty 500

## 2017-09-25 MED ORDER — IPRATROPIUM BROMIDE 0.02 % IN SOLN
0.5000 mg | Freq: Four times a day (QID) | RESPIRATORY_TRACT | Status: DC
  Administered 2017-09-25 – 2017-09-26 (×6): 0.5 mg via RESPIRATORY_TRACT
  Filled 2017-09-25 (×6): qty 2.5

## 2017-09-25 MED ORDER — SPIRONOLACTONE 25 MG PO TABS
25.0000 mg | ORAL_TABLET | Freq: Every day | ORAL | Status: DC
  Administered 2017-09-25 – 2017-09-26 (×2): 25 mg
  Filled 2017-09-25 (×2): qty 1

## 2017-09-25 MED ORDER — FLORANEX PO PACK
1.0000 g | PACK | Freq: Three times a day (TID) | ORAL | Status: DC
  Filled 2017-09-25 (×5): qty 1

## 2017-09-25 MED ORDER — VANCOMYCIN HCL IN DEXTROSE 750-5 MG/150ML-% IV SOLN: 750.0000 mg | INTRAVENOUS | Status: DC

## 2017-09-25 MED ORDER — VANCOMYCIN HCL 500 MG IV SOLR
500.0000 mg | INTRAVENOUS | Status: DC
  Filled 2017-09-25: qty 500

## 2017-09-25 MED ORDER — FENTANYL CITRATE (PF) 100 MCG/2ML IJ SOLN: 50.0000 ug | INTRAMUSCULAR | Status: DC | PRN

## 2017-09-25 MED ORDER — LEVALBUTEROL HCL 0.63 MG/3ML IN NEBU
0.6300 mg | INHALATION_SOLUTION | Freq: Four times a day (QID) | RESPIRATORY_TRACT | Status: DC
  Administered 2017-09-25: 0.63 mg via RESPIRATORY_TRACT
  Filled 2017-09-25: qty 3

## 2017-09-25 MED ORDER — LEVALBUTEROL HCL 0.63 MG/3ML IN NEBU
0.6300 mg | INHALATION_SOLUTION | Freq: Four times a day (QID) | RESPIRATORY_TRACT | Status: DC
  Administered 2017-09-25 – 2017-09-26 (×6): 0.63 mg via RESPIRATORY_TRACT
  Filled 2017-09-25 (×7): qty 3

## 2017-09-25 MED ORDER — FUROSEMIDE 10 MG/ML IJ SOLN: 20.0000 mg | Freq: Two times a day (BID) | INTRAMUSCULAR | Status: DC

## 2017-09-25 NOTE — Progress Notes (Signed)
PCCM Attending Re-Rounding Note:  Patient seen this afternoon re-rounding. Lived by respiratory therapist back over to full support for rest. Respiratory rate now 19 breaths per minute. Leaking PEEP at 8. Noted patient had vancomycin and Eraxis restarted by cardiothoracic surgery with low-grade fever. Temperature curve overall seems to be down trending. Defer further antibiotics to cardiothoracic surgery. Plan of ventilator weaning discussed with respiratory therapist: 2 hours of pressure support wean with 4 hours of rest and full ventilator support for rest overnight. Awaiting afternoon electrolyte panel to evaluate hypernatremia.  I have spent an additional 11 minutes of critical care time today caring for the patient and reviewing the plan of ventilator weaning with respiratory therapist at bedside.  Donna ChristenJennings E. Jamison NeighborNestor, M.D. [facility] Pager:  [phone] After 7pm or if no response, call [phone] 4:19 PM 09/25/17

## 2017-09-25 NOTE — Progress Notes (Signed)
Physical Therapy Treatment Patient Details Name: Clinton HammockReginald E Templeton MRN: [id number] DOB: 1951/10/27 Today's Date: 09/25/2017    History of Present Illness Patient is a 66 y/o Caucasian M with hx CADs/p BMS to RCA in 1999 (due to an inferior STEMI), significant PVD, HTN, Type 2 DM (non-insulin dependent), HLP, Tobacco use comes in today complaining of substernal chest pain that started last night at 11 pm. He had associated SOB, nausea, diaphoresis. Pt underwent CABGx4 on 9/20. Pt then underwent removal of R VAD with cardiopulmonary bypass on 10/1. Pt self extubated on 10/8. reintubated 09/17/17    PT Comments    Pt admitted with above diagnosis. Pt currently with functional limitations due to balance and endurance deficits. Pt was able to tolerate 55 degrees of tilt and in tilt at least 15 minutes total.  VSS.  Nurse in room and educated and feels comfortable performing tilt again a few times.  Pt was somewhat lethargic today.  Left pt in chair position in bed.   Will continue and progress pt as pt tolerates.  Pt will benefit from skilled PT to increase their independence and safety with mobility to allow discharge to the venue listed below.     Follow Up Recommendations  CIR;Supervision/Assistance - 24 hour     Equipment Recommendations   (TBD)    Recommendations for Other Services Rehab consult     Precautions / Restrictions Precautions Precautions: Sternal Precaution Comments: trach on vent Restrictions Weight Bearing Restrictions: No Other Position/Activity Restrictions: sternal precautions    Mobility  Bed Mobility                  Transfers Overall transfer level: Needs assistance Equipment used:  (Vital Go tilt bed) Transfers: Sit to/from Stand Sit to Stand: Total assist;+2 safety/equipment         General transfer comment: Progressively inclined total lift bed to 55 degrees with pt tolerating well with BP staying stable.  RR up to 44 at times but would decr  to 30 and then would incline pt more.  Pt had up to 1/2 his body weight on his feet in partial stand at 55 degrees.    Ambulation/Gait             General Gait Details: currently unable   Stairs            Wheelchair Mobility    Modified Rankin (Stroke Patients Only)       Balance         Postural control: Right lateral lean (anterior lean) Standing balance support: No upper extremity supported;During functional activity Standing balance-Leahy Scale: Zero Standing balance comment: Achieved 55 degree tilt today.  Pt with right lateral lean.  Lethargic and difficult to arouse.                             Cognition Arousal/Alertness: Lethargic Behavior During Therapy: WFL for tasks assessed/performed Overall Cognitive Status: Impaired/Different from baseline Area of Impairment: Following commands;Problem solving                             Problem Solving: Slow processing;Decreased initiation;Difficulty sequencing;Requires verbal cues;Requires tactile cues General Comments: Pt lethargic today.       Exercises      General Comments General comments (skin integrity, edema, etc.): 30% FiO2, PEEP 5, RR 29-44 with activity.  BP stable throughout.  Pertinent Vitals/Pain Faces Pain Scale: Hurts even more Pain Location: generalized Pain Descriptors / Indicators: Aching;Guarding    Home Living                      Prior Function            PT Goals (current goals can now be found in the care plan section) Acute Rehab PT Goals Patient Stated Goal: didn't state Progress towards PT goals: Progressing toward goals    Frequency    Min 3X/week      PT Plan Current plan remains appropriate    Co-evaluation              AM-PAC PT "6 Clicks" Daily Activity  Outcome Measure  Difficulty turning over in bed (including adjusting bedclothes, sheets and blankets)?: Unable Difficulty moving from lying on back to  sitting on the side of the bed? : Unable Difficulty sitting down on and standing up from a chair with arms (e.g., wheelchair, bedside commode, etc,.)?: Unable Help needed moving to and from a bed to chair (including a wheelchair)?: Total Help needed walking in hospital room?: Total Help needed climbing 3-5 steps with a railing? : Total 6 Click Score: 6    End of Session Equipment Utilized During Treatment: Oxygen (trach on vent) Activity Tolerance: Patient limited by fatigue;Patient limited by lethargy Patient left: with call bell/phone within reach;with nursing/sitter in room;in bed Nurse Communication: Mobility status;Need for lift equipment PT Visit Diagnosis: Unsteadiness on feet (R26.81);Muscle weakness (generalized) (M62.81);Difficulty in walking, not elsewhere classified (R26.2);Other abnormalities of gait and mobility (R26.89)     Time: 1610-9604 PT Time Calculation (min) (ACUTE ONLY): 41 min  Charges:  $Therapeutic Activity: 38-52 mins                    G Codes:       Osborn Pullin,PT Acute Rehabilitation [phone] (pager)    Berline Lopes 09/25/2017, 10:44 AM

## 2017-09-25 NOTE — Progress Notes (Addendum)
NCM contatec Dr. Donata ClayVan Trigt 's office , [facility], she states she will give him the message about the peer to peer when he is out of surgery.  NCM left MD name and phone number for him to contact.  Joylene states she will call me back once she finds out if he will be doing this or not.   10/18 1626 NCM spoke with Candi LeashGreta Wentz,  [phone] patient's daughter, she states she would like for him to go to [facility] since it is right here in the hospital and [facility] MD will get to see him here also.  Peer to peer was done by Dr. Maren BeachVantrigt and he said they are going to [facility].

## 2017-09-25 NOTE — Progress Notes (Signed)
Patient ID: Clinton Harrington, male   DOB: 1951-05-26, 66 y.o.   MRN: [id number]        Advanced Heart Failure Rounding Note   Subjective:    Events CABG x4 IABP Centrimag 9/20    IABP removed 9/24.  Centrimag RVAD in place DC-CV successful 9/29  Centrimag removal 10/1 Self-extubated 10/8 Re-intubated 10/9 S/P Tach 10/10 Recultured 10/11  Yesterday received 40 mg IV lasix. Weight trending up. CVP 11-12 Placed on D5W 50 cc per hour. Denies SOB/pain.   ECHO 09/17/2017  Left ventricle: The cavity size was normal. There was moderate   concentric hypertrophy. Systolic function was normal. The   estimated ejection fraction was in the range of 60% to 65%.   Although no diagnostic regional wall motion abnormality was   identified, this possibility cannot be completely excluded on the   basis of this study. Doppler parameters are consistent with   abnormal left ventricular relaxation (grade 1 diastolic   dysfunction). Doppler parameters are consistent with   indeterminate mean left atrial filling pressure. - Ventricular septum: Septal motion showed paradox. - Aortic valve: There was mild stenosis. Valve area (VTI): 1.99   cm^2. Valve area (Vmax): 1.99 cm^2. Valve area (Vmean): 1.99   cm^2. - Right ventricle: Systolic function was moderately reduced. Impressions: - Compared to 09/05/2017, the right ventricle appears less dilated   and there is some improvement in RV function.   Objective:   Weight Range: 193 lb (87.5 kg) Body mass index is 26.92 kg/m.   Vital Signs:   Temp:  [98.5 F (36.9 C)-100.1 F (37.8 C)] 100.1 F (37.8 C) (10/18 0400) Pulse Rate:  [88-100] 90 (10/18 0600) Resp:  [20-41] 37 (10/18 0600) BP: (101-144)/(63-81) 114/76 (10/18 0600) SpO2:  [91 %-100 %] 98 % (10/18 0600) FiO2 (%):  [30 %] 30 % (10/18 0400) Weight:  [193 lb (87.5 kg)] 193 lb (87.5 kg) (10/18 0500) Last BM Date: 09/24/17  Weight change: Filed Weights   09/23/17 0500 09/24/17 0400  09/25/17 0500  Weight: 188 lb 4.4 oz (85.4 kg) 188 lb 4.4 oz (85.4 kg) 193 lb (87.5 kg)    Intake/Output:   Intake/Output Summary (Last 24 hours) at 09/25/17 0705 Last data filed at 09/25/17 0600  Gross per 24 hour  Intake          2909.17 ml  Output             2155 ml  Net           754.17 ml      Physical Exam  CVP 11-12  General:  Trach. No resp difficulty HEENT: normal Neck: Trach supple. no JVD. Carotids 2+ bilat; no bruits. No lymphadenopathy or thryomegaly appreciated. Cor: PMI nondisplaced. Regular rate & rhythm. No rubs, gallops or murmurs. Lungs: coarse throughout Abdomen: soft, nontender, nondistended. No hepatosplenomegaly. No bruits or masses. Good bowel sounds. Extremities: no cyanosis, clubbing, rash, edema. RLE/LLE heel boot. RUE PICC  Neuro: Trach. MAE. Follows commands.  Skin: dressing intact   Telemetry   NSR 90s personally reviewed.   Labs    CBC  Recent Labs  09/24/17 0343 09/25/17 0413  WBC 7.2 6.5  HGB 7.8* 7.8*  HCT 25.8* 26.1*  MCV 98.1 98.9  PLT 181 578   Basic Metabolic Panel  Recent Labs  09/24/17 1951 09/25/17 0413  NA 140 151*  K 4.0 4.1  CL 113* 123*  CO2 20* 23  GLUCOSE 391* 152*  BUN 49* 50*  CREATININE  1.19 1.07  CALCIUM 6.7* 7.2*  MG  --  3.0*  PHOS  --  3.0   Liver Function Tests No results for input(s): AST, ALT, ALKPHOS, BILITOT, PROT, ALBUMIN in the last 72 hours. No results for input(s): LIPASE, AMYLASE in the last 72 hours. Cardiac Enzymes No results for input(s): CKTOTAL, CKMB, CKMBINDEX, TROPONINI in the last 72 hours.  BNP: BNP (last 3 results)  Recent Labs  08/23/17 1927  BNP 200.0*    ProBNP (last 3 results) No results for input(s): PROBNP in the last 8760 hours.   D-Dimer No results for input(s): DDIMER in the last 72 hours. Hemoglobin A1C No results for input(s): HGBA1C in the last 72 hours. Fasting Lipid Panel No results for input(s): CHOL, HDL, LDLCALC, TRIG, CHOLHDL, LDLDIRECT in  the last 72 hours. Thyroid Function Tests No results for input(s): TSH, T4TOTAL, T3FREE, THYROIDAB in the last 72 hours.  Invalid input(s): FREET3  Other results:   Imaging    No results found.   Medications:     Scheduled Medications: . atorvastatin  80 mg Per Tube q1800  . brimonidine  1 drop Right Eye BID  . budesonide (PULMICORT) nebulizer solution  0.25 mg Nebulization BID  . chlorhexidine gluconate (MEDLINE KIT)  15 mL Mouth Rinse BID  . Chlorhexidine Gluconate Cloth  6 each Topical Daily  . collagenase   Topical Daily  . feeding supplement (PRO-STAT SUGAR FREE 64)  30 mL Per Tube TID  . free water  400 mL Per Tube Q6H  . furosemide  40 mg Intravenous BID  . insulin aspart  0-24 Units Subcutaneous Q4H  . insulin glargine  30 Units Subcutaneous BID  . levalbuterol  0.63 mg Nebulization QID  . mouth rinse  15 mL Mouth Rinse QID  . mupirocin cream   Topical BID  . pantoprazole sodium  40 mg Per Tube Daily  . sodium chloride flush  10-40 mL Intracatheter Q12H  . sodium chloride flush  3 mL Intravenous Q12H  . spironolactone  12.5 mg Per Tube Daily  . warfarin  3 mg Oral ONCE-1800  . Warfarin - Pharmacist Dosing Inpatient   Does not apply q1800    Infusions: . sodium chloride Stopped (09/24/17 2200)  . dextrose 50 mL/hr at 09/25/17 0505  . DOPamine Stopped (09/23/17 1600)  . feeding supplement (VITAL 1.5 CAL) 1,000 mL (09/24/17 1800)  . meropenem (MERREM) IV Stopped (09/25/17 0535)  . potassium chloride 10 mEq (09/22/17 0218)    PRN Medications: acetaminophen (TYLENOL) oral liquid 160 mg/5 mL, fentaNYL (SUBLIMAZE) injection, metoprolol tartrate, midazolam, ondansetron (ZOFRAN) IV, oxyCODONE, potassium chloride, sodium chloride flush, sodium chloride flush    Patient Profile   Mr Primm is a 66 year old with a history of DMII, smoker, CAD S/P PCI RCA 20 years ago, HTN, PAD, and hyperlipidemia admitted with CP and inferior MI with RV infarction.  He underwent  LHC with IABP. After optimized, underwent CABG x4 centrimag for RV infarct on 9/20 and continued IABP.   Assessment/Plan   1. Post-cardiotomy cardiogenic shock: Primarily RV failure from inferior infarct with RV involvement.  Repeat echo on 9/24 showed vigorous LV systolic function with moderate RV dysfunction.  Now s/p CABG and Centrimag for RV support on 9/20. IABP removed 9/24 with good LV function.  Centrimag out 10/1.  ECHO 09/17/2017 EF 60-65%, grade I DD, RV with some improvement/less dilated.   Off dopamine. SBP stable.  -Increase spiro to 25 mg daily and this may with  hyponatremia -CVP 11-12.  Continue lasix 40 mg IV twice a day.     -Renal function stable.  2. CAD: Inferior MI with RV involvement at presentation, now s/p CABG.   - Continue asa to 81 mg daily.  - Continue statin.  3. Atrial fibrillation/flutter: DCCV 9/29, now remains in NSR.  - Continue amiodarone per tube. Continue amio 200 mg twice a day,  - On warfarin.  INR 2.13 4. AKI: Creatinine down to 1.1 5. Acute respiratory failure: Suspect left-sided HCAP. Self-extubated 09/15/17. Had resp distress overnight into 09/16/17, remained weak requiring re-intubation.  S/P Trach 09/17/2017. On 40%.  - Per respiratory therapy. Failed wean yesterday. - Abx broadened.  6. Neuro: ? L-sided weakness now resolved.  Head CT 10/5 negative.  Resolved.   7. ID: RLL PNA on chest CT 10/15, HCAP.  Suspect hypotension is septic/vasogenic shock.  Has pressure ulcer that could be source as well. Tm 100.1.  - Off vancomycin and anidulafungin.   - Remains on meropenem.    8. Anemia: Received 2 uPRBCs 09/04/17. 4 units PRBCs on 9/29. Received 2U PRBCs 10/1.    He received IV Fe on 10/11.  Hgb 7.8  .  10. Unstageable Pressure Ulcer - R Buttock partial thickness. Left buttock with slough. Continue santyl to left buttock for selective debridement.  - Continue reposition. On tube feeds. WOC to reassess today. PT to start hydrotherapy.  11. Diabetes:  Better controlled with addition of lantus.  12. Deconditioning: PT consulted. CIR recommended.  ? LTAC 13. Hypernatremia: Sodium down to 151. On D5W at 50 cc per hour.   14. Urinary Retention- Requiring I&O cath. Darrick Grinder, NP  09/25/2017   Patient seen with NP, agree with above note.  Continue Lasix 40 mg IV bid with CVP 12.   Awake/alert, remains on vent.  Slow wean with suspected HCAP.  Remains on meropenem, Tm 100.1.  Working with PT.   Loralie Champagne 09/25/2017 7:05 AM   Advanced Heart Failure Team Pager [phone] (M-F; [place])  Please contact [facility] for night-coverage after hours (4p -7a ) and weekends on [email or web address]

## 2017-09-25 NOTE — Progress Notes (Signed)
CT Surg  Placed in standing position today Did few hrs of PS weaning Temp back to 100 - vanco restarted , eraxis restarted Phone discussion re: LTAC today- will be approved Check cxr and electrolytes in am

## 2017-09-25 NOTE — Progress Notes (Signed)
ANTICOAGULATION CONSULT NOTE  Pharmacy Consult for heparin/Coumadin Indication: afib, s/p DCCV 9/29  Allergies  Allergen Reactions  . Aspirin Other (See Comments)    Stomach upset  . Latex Rash  . Oxycodone-Acetaminophen Itching    Patient Measurements: Height: 5\' 11"  (180.3 cm) Weight: 193 lb (87.5 kg) IBW/kg (Calculated) : 75.3  Vital Signs: Temp: 100.1 F (37.8 C) (10/18 0400) Temp Source: Oral (10/18 0400) BP: 112/67 (10/18 0400) Pulse Rate: 92 (10/18 0400)  Labs:  Recent Labs  09/23/17 0351 09/24/17 0343 09/24/17 1951 09/25/17 0413  HGB 8.1* 7.8*  --  7.8*  HCT 26.4* 25.8*  --  26.1*  PLT 197 181  --  180  LABPROT 21.6* 22.1*  --  23.7*  INR 1.90 1.95  --  2.13  CREATININE 1.45* 1.11 1.19 1.07    Estimated Creatinine Clearance: 75.3 mL/min (by C-G formula based on SCr of 1.07 mg/dL).   Assessment: 89 yoM with post/op AFib s/p DCCV continues on warfarin per pharmacy. Heparin bridge discontinued per TCTS yesterday. INR continues to trend up to 2.13, CBC low but stable, dopplers negative for DVT 10/16.  Goal of Therapy:  INR 2-3 Monitor platelets by anticoagulation protocol: Yes   Plan: -Coumadin 3 mg PO x1 tonight -Daily  INR  Fredonia HighlandMichael Jeronda Don, PharmD PGY-2 Cardiology Pharmacy Resident Pager: [phone] 09/25/2017

## 2017-09-25 NOTE — Progress Notes (Signed)
  Trach team- Speech Pathology Patient Details Name: Clinton HammockReginald E Bertz MRN: [id number] DOB: [birth year] Today's Date: 09/25/2017 Time:  -     This Speech Pathologist reviewed pt's chart as part of the trach team. He appears to be alert and responsive most of the time; currently on CPAP/PSVS mode. Appears he may be appropriate for inline Passy-Muir speaking valve. MD, please order if in agreement.    Breck CoonsLisa Willis RoyalLitaker M.Ed [employer]-SLP Pager [phone]

## 2017-09-25 NOTE — Progress Notes (Addendum)
Nutrition Follow-up  DOCUMENTATION CODES:   Not applicable  INTERVENTION:    Continue Vital 1.5 at goal rate of 50 ml/h with Prostat 30 ml TID   Provides 2100 kcals, 126 gm protein, 912 ml of free water daily  NUTRITION DIAGNOSIS:   Inadequate oral intake related to inability to eat as evidenced by NPO status; ongoing  GOAL:   Patient will meet greater than or equal to 90% of their needs; met  MONITOR:   Vent status, TF tolerance, Weight trends, Skin, Labs, I & O's  ASSESSMENT:   66 yo Male with PMH of DM, HTN; found to have severe three-vessel disease; presented for surgical intervention.  S/p CABG x 4 and Centrimag RVAD 9/20  Centrimag RVAD removed 10/1 Self-extubated 10/8 Re-intubated 10/9 for airway protection, inability to swallow Tracheostomy 10/10 Cortrak tube placed 10/10  Patient is currently on ventilator support via trach MV: 13.9 L/min Temp (24hrs), Avg:99.8 F (37.7 C), Min:99 F (37.2 C), Max:100.9 F (38.3 C)  Vital 1.5 infusing at goal rate of 50 ml/hr via Cortrak small bore feeding tube. Prostat 30 ml TID. Medications reviewed and include Lactinex, Coumadin and ABX. Speech Path consulted for swallow evaluation (BSE, MBS).  Free water flushes at 400 ml every 6 hours. Labs reviewed. Na 151 (H). Mg 3.0 (H). CBG's [phone].  Diet Order:  Diet NPO time specified  Skin:  DTPI's to L & R buttocks, medial occiput   Last BM:  10/18   Intake/Output Summary (Last 24 hours) at 09/25/17 1624 Last data filed at 09/25/17 1600  Gross per 24 hour  Intake             4000 ml  Output             1990 ml  Net             2010 ml   Height:   Ht Readings from Last 1 Encounters:  09/12/17 _0  (1.803 m)    Weight:   Wt Readings from Last 1 Encounters:  09/25/17 193 lb (87.5 kg)  Admit wt         200 lb (90.7 kg)  Ideal Body Weight:  78.1 kg  BMI:  Body mass index is 26.92 kg/m.  Estimated Nutritional Needs:   Kcal:  2236  Protein:   120-135 gm  Fluid:  per MD  EDUCATION NEEDS:   No education needs identified at this time  Arthur Holms, RD, LDN Pager #: [phone] After-Hours Pager #: [phone]

## 2017-09-25 NOTE — Plan of Care (Signed)
Problem: Skin Integrity: Goal: Risk for impaired skin integrity will decrease Outcome: Progressing Pt on therapy rotation bed, low air loss mattress

## 2017-09-25 NOTE — Plan of Care (Signed)
Problem: Activity: Goal: Risk for activity intolerance will decrease Outcome: Progressing Patient on Vitalgo total lift bed

## 2017-09-25 NOTE — Progress Notes (Signed)
PULMONARY / CRITICAL CARE MEDICINE   Name: Clinton Harrington MRN: [id number] DOB: [birth year]    ADMISSION DATE:  08/23/2017   CONSULTATION DATE:  08/30/2017  REFERRING MD:  Tressie Stalker, M.D. / CVTS  CHIEF COMPLAINT:  Acute Hypoxic Respiratory Failure   HISTORY OF PRESENT ILLNESS:  66 y.o. male with known coronary artery disease who suffered an MI on 9/15. Patient notably had severe RV failure. Underwent treatment with coronary artery bypass graft, intra-aortic balloon pump and eventually placement of a centrimag device. Postoperatively patient was weaned off right ventricular assist device on 10/1. Patient remained on mechanical ventilation and treatment for left lower lobe healthcare associated pneumonia. He completed a course of antibiotics. He underwent tracheostomy placement on 10/10 but suffered a fever on 10/11 and was subsequently recultured.  SUBJECTIVE: No acute events overnight. Patient still having copious diarrhea. Continues to have high respiratory rate on pressure support weaning. Nods no to any pain or difficulty breathing.  REVIEW OF SYSTEMS:  Unable to obtain given ventilator requirement and tracheostomy.  VITAL SIGNS: BP 104/73 (BP Location: Left Arm)   Pulse 91   Temp 99 F (37.2 C) (Oral)   Resp (!) 39   Ht 5\' 11"  (1.803 m)   Wt 193 lb (87.5 kg)   SpO2 95%   BMI 26.92 kg/m   HEMODYNAMICS: CVP:  [10 mmHg-13 mmHg] 13 mmHg  VENTILATOR SETTINGS: Vent Mode: CPAP;PSV FiO2 (%):  [30 %] 30 % Set Rate:  [16 bmp] 16 bmp Vt Set:  [600 mL] 600 mL PEEP:  [5 cmH20] 5 cmH20 Pressure Support:  [12 cmH20-15 cmH20] 15 cmH20 Plateau Pressure:  [16 cmH20-22 cmH20] 19 cmH20  INTAKE / OUTPUT: I/O last 3 completed shifts: In: 4759.2 [I.V.:1249.2; NG/GT:2550; IV Piggyback:960] Out: 3015 [Urine:2690; Emesis/NG output:150; Stool:175]  PHYSICAL EXAMINATION: General:  Sleeping until awoken. No acute distress. No family at bedside.  Integument:  Warm & dry. No rash on  exposed skin. Sternal incision appears clean, dry, and intact. Extremities:  No cyanosis or clubbing.  HEENT:  Moist mucus membranes. No oral ulcers. No scleral injection or icterus. Tracheostomy in place Cardiovascular:  Regular rate. No JVD appreciated.  Pulmonary:  Diminished breath sounds in the bases. Tachypneic with tidal volume 400-450 mL on pressure support. Abdomen: Soft. Normal bowel sounds. Protuberant. Musculoskeletal:  Normal bulk and tone. No joint effusion appreciated. Neurological:  Cranial nerves 2-12 grossly in tact. Moving all 4 extremities equally. Following commands.  LABS:  BMET  Recent Labs Lab 09/24/17 0343 09/24/17 1951 09/25/17 0413  NA 153* 140 151*  K 3.7 4.0 4.1  CL 125* 113* 123*  CO2 23 20* 23  BUN 53* 49* 50*  CREATININE 1.11 1.19 1.07  GLUCOSE 180* 391* 152*    Electrolytes  Recent Labs Lab 09/18/17 1900 09/19/17 0358  09/24/17 0343 09/24/17 1951 09/25/17 0413  CALCIUM  --  8.1*  < > 7.1* 6.7* 7.2*  MG 3.0* 2.9*  --   --   --  3.0*  PHOS 2.6 2.6  --   --   --  3.0  < > = values in this interval not displayed.  CBC  Recent Labs Lab 09/23/17 0351 09/24/17 0343 09/25/17 0413  WBC 9.3 7.2 6.5  HGB 8.1* 7.8* 7.8*  HCT 26.4* 25.8* 26.1*  PLT 197 181 180    Coag's  Recent Labs Lab 09/23/17 0351 09/24/17 0343 09/25/17 0413  INR 1.90 1.95 2.13    Sepsis Markers  Recent Labs Lab 09/21/17 0847  09/22/17 0350 09/23/17 0351  PROCALCITON 0.23 0.22 0.28    ABG  Recent Labs Lab 09/20/17 2220  PHART 7.410  PCO2ART 36.8  PO2ART 174.0*    Liver Enzymes  Recent Labs Lab 09/19/17 0358 09/22/17 0350  AST 62* 141*  ALT 33 80*  ALKPHOS 73 73  BILITOT 0.8 0.7  ALBUMIN 2.6* 2.1*    Cardiac Enzymes No results for input(s): TROPONINI, PROBNP in the last 168 hours.  Glucose  Recent Labs Lab 09/24/17 0750 09/24/17 1148 09/24/17 1517 09/24/17 1937 09/24/17 2331 09/25/17 0757  GLUCAP 169* 227* 152* 235* 200*  163*    Imaging No results found.   STUDIES:  LHC 08/23/17:  Prox RCA to Mid RCA lesion, 100 %stenosed.  Ost LAD lesion, 90 %stenosed.  Mid LAD lesion, 75 %stenosed.  1st Diag lesion, 75 %stenosed. PFT 08/25/17: FVC 1.34 L (32%) FEV1 1.04 L (33%) FEV1/FVC 0.77 FEF 25-75 0.83 L (33%) negative bronchodilator response CT HEAD W/O 10/5: IMPRESSION: 1. No definite CT evidence for acute intracranial abnormality. MRI follow-up as indicated. 2. Atrophy and small vessel ischemic changes of the white matter 3. Fluid within the bilateral mastoid air cells and middle ears. TTE (09/17/17):  EF 60-65% with moderate concentric hypertrophy but normal cavity size. No regional wall motion abnormalities but cannot completely exclude based on study. Grade 1 diastolic dysfunction. LA & RA normal in size. RV normal in size with moderately reduced systolic function. Mild aortic stenosis with no regurgitation. Aortic root normal in size. No mitral stenosis or regurgitation. No pulmonic regurgitation with poorly visualized valve. No tricuspid regurgitation. No pericardial effusion. CT CHEST/ABD/PELVIS W/O 10/15: IMPRESSION: 1. Multifocal airspace opacities are noted in the right lower lobe concerning for multifocal pneumonia. 2. Mild left pleural effusion is noted with adjacent subsegmental atelectasis. 3. Tracheostomy and feeding tubes are in grossly good position. 4. Minimal pericardial effusion. 5. Aortic atherosclerosis. 6. Moderate size left inguinal hernia is noted which contains a portion of the urinary bladder. BILATERAL LOWER EXTREMITY VENOUS DUPLEX 10/16:  Negative for DVT or SVT.  PORT CXR 10/17:  Personally reviewed by me. Hazy left basilar opacity suspicious for pleural effusion. Minimal atelectasis at the left base. Lordotic view. Somewhat low lung volumes. Tracheostomy in place. Right upper extremity PICC line in place. Enteric feeding tube coursing below diaphragm. No new focal opacity  appreciated.  MICROBIOLOGY: MRSA PCR 9/15:  Negative HIV 9/15:  Nonreactive  MRSA PCR 9/16:  Negative Tracheal Aspirate Culture 9/20:  Normal Flora Tracheal Aspirate Culture 9/22:  Normal Flora  Urine Culture 9/24:  Negative  Tracheal Aspirate Culture 9/24:  Normal Flora  Blood Cultures x2 9/24:  Negative  Blood Cultures x2 9/28:  1/2 Positive for MRSE Sternal Chest Wound Culture 10/1:  Negative  Tracheal Aspirate Culture 10/2:  Moderate Candida albicans  Bronchoscopy w/ Wash 10/2:  Normal Flora  Blood Cultures x2 10/3:  Negative  Tracheal Aspirate Culture 10/7:  Few Candida albicans  Tracheal Aspirate Culture 10/11:  Few MRSE Blood Cultures x2 10/11:  Negative  Urine Culture 10/11:  Negative  Tracheal Aspirate Culture 10/13:  Normal Flora Stool C diff 10/13:  Negative  Blood Cultures x2 10/13 >>>  ANTIBIOTICS: Vancomycin 9/20 - 10/5; 10/11 - 10/17 Cefuroxime 9/20 - 9/22 Fortaz 9/22 - 9/28; 10/11 - 10/15 Zosyn 9/28 - 10/11 Diflucan 10/3 - 10/8; 10/11 - 10/13 Eraxis 10/15 - 10/17 Merrem 10/15 >>>  SIGNIFICANT EVENTS: 09/15 - Admitted w/ acute MI >> LHC & IABP placed 09/20 -  Intubated & underwent CABG & RVAD placement 09/23 - Remained on RVAD w/ Levophed & Epinephrine drips 09/24 - Patient on Levophed, Epinephrine, Primacor, Amiodarone, & Lasix drips 09/26 - Diuresing on lasix drip at 15mg /hr. Still on RVAD 09/27 - Weaned to FiO2 0.4 & PEEP 8. Levophed, Primacor, Epinephrine, & Amiodarone drips continuing. Heparin drip held with bleeding at groin site>>transfused 2u PRBC. 09/29 - Cardioversion for A fib. Transfused 4u PRBC. 09/30 - Mucus plugging relieved with multiple bag-mask lavages 10/01 - RVAD removed in OR secondary to bleeding. Transfused 2u PRBC. 10/02 - Remains on Levophed, Vasopressin, Epinephrine, & Primacor 10/08 - Self-extubated 10/09 - Reintubated 10/10 - Tracheostomy placed   LINES/TUBES: OETT 09/22 - 10/8 (self-extubated); 10/9 - 10/10 IABP 9/15 -  9/24 R [place] CVL 10/1 - 10/12? CHEST TUBES - out S/P Trach #6 10/10 >>> RUE DL PICC 40/98 >>> L NGT >>> FOLEY >>>  ASSESSMENT / PLAN:  PULMONARY A: Acute hypoxic respiratory failure: Multifactorial from healthcare associated pneumonia as well as pulmonary edema. Healthcare associated pneumonia: Left basilar infiltrate. Left pleural effusion: Small. No plan for chest tube for thoracentesis. Status post tracheostomy placement: Sutures are removed 10/16.  P:   Continuous pulse oximetry monitoring Gentle diuresis as renal function and hypernatremia allow Continuing pressure support wean as tolerated Advancing to tracheostomy collar as tolerated Continuing Xopenex nebs every 6 hours Continuing Pulmicort 0.25 mg nebulized twice a day Adding Atrovent 0.5 mg nebulized every 6 hours to regimen Upright position as much as possible to improve diaphragmatic excursion  CARDIOVASCULAR A:  Acute myocardial infarction: Status post CABG. Cardiogenic shock: Resolved. Cor pulmonale: Evident on echocardiogram. Right ventricular assist device removed. Paroxysmal atrial fibrillation  P:  Postoperative care per thoracic surgery Systemic anticoagulation with Coumadin/heparin drip per pharmacy protocol Continuous telemetry monitoring Monitoring vitals per unit protocol Continuing Lipitor 80 mg daily at bedtime Continuing Aldactone 25 mg via tube daily  RENAL A:   Hypernatremia: Acutely worse today but possible the 140 yesterday was an error. Hypokalemia: Resolved. Metabolic acidosis: Resolved. Acute renal failure: Resolving.  P:   Monitor urine output with Foley Repeat electrolyte panel at 3 PM Holding further Lasix Continuing free water 400 mL via tube every 6 hour Continuing D5W at 50 mL per hour  GASTROINTESTINAL A:   Diarrhea: C. difficile negative. Left inguinal hernia: Noted on CT of the abdomen also containing a portion of the bladder.  P:   Monitoring stool  output.  HEMATOLOGIC A:   Anemia: No signs of active bleeding. Status post prior transfusion on 9/27. Coagulopathy: Secondary to Coumadin.  P:  Trending INR daily Trending cell counts daily with CBC Transfusion goals as per primary service  INFECTIOUS A:   Sepsis: Possible sources include healthcare associated pneumonia vs buttock pressure ulcer. MRSE pneumonia  P:   Awaiting finalization of cultures Currently on day #4/7 of meropenem Plan to reculture for fever  ENDOCRINE A:   Diabetes mellitus: Glucose labile on dextrose infusion.  P:   Continuing Accu-Cheks every 4 hours with sliding scale insulin per protocol Continuing Lantus 30 units subcutaneous twice a day  NEUROLOGIC A:   Postoperative pain Left-sided weakness:  Resolved. May need MRI brain.   P:   Oxycodone & fentanyl ordered as needed  Prophylaxis:  Protonix via tube daily.  Diet:  NPO. Continuing tube feedings.  Code Status:  Full Code as per previous physician discussions.  Disposition:  As per primary service. PT consulted. Family Update: No family at bedside at the  time of my rounds.  DISCUSSION:  66 y.o. male with myocardial infarction and cor pulmonale post CABG. Ongoing respiratory failure with poor improvement on pressure support weaning. Continuing on treatment for methicillin-resistant Staphylococcus epidermidis pneumonia. Plan to reculture for fever.  I have personally spent a total of 34 minutes of critical care time today caring for the patient & reviewing the patient's electronic medical record.  Remainder of care as per primary service and other consultants.  Donna Christen Jamison Neighbor, M.D. [facility] Pulmonary & Critical Care Pager:  [phone] After 7pm or if no response, call [phone] 09/25/2017, 9:34 AM

## 2017-09-25 NOTE — Progress Notes (Signed)
17 Days Post-Op Procedure(s) (LRB): REMOVAL OF R-VENTRICULAR ASSIST DEVICE WITH CARDIOPULMONARY BYPASS (N/A) TRANSESOPHAGEAL ECHOCARDIOGRAM (TEE) (N/A) Subjective: One month postop CABG, RVAD placement for MI, shock, RV infarct Now with trach, HCAP and fever improving on vanc, merepenum, eraxis PS weaning difficult due to pre-existing COPD, heavy smoking Postop anemia- multifactorial Hypernatremia Poorly controlled DM sacral decubitus Incisions healing nsr with prior PAF on coumadin Objective: Vital signs in last 24 hours: Temp:  [99 F (37.2 C)-100.1 F (37.8 C)] 99.2 F (37.3 C) (10/18 1146) Pulse Rate:  [90-100] 91 (10/18 1200) Cardiac Rhythm: Normal sinus rhythm (10/18 0800) Resp:  [20-39] 38 (10/18 1200) BP: (101-144)/(64-81) 111/69 (10/18 1200) SpO2:  [95 %-100 %] 100 % (10/18 1200) FiO2 (%):  [30 %] 30 % (10/18 1200) Weight:  [193 lb (87.5 kg)] 193 lb (87.5 kg) (10/18 0500)  Hemodynamic parameters for last 24 hours: CVP:  [10 mmHg-15 mmHg] 13 mmHg  Intake/Output from previous day: 10/17 0701 - 10/18 0700 In: 3009.2 [I.V.:1199.2; NG/GT:1250; IV Piggyback:560] Out: 2155 [Urine:2005; Emesis/NG output:150] Intake/Output this shift: Total I/O In: 900 [I.V.:250; NG/GT:650] Out: 675 [Urine:675]       Exam    General- alert and comfortable   Lungs- clear without rales, wheezes   Cor- regular rate and rhythm, no murmur , gallop   Abdomen- soft, non-tender   Extremities - warm, non-tender, minimal edema   Neuro- oriented, appropriate, no focal weakness   Lab Results:  Recent Labs  09/24/17 0343 09/25/17 0413  WBC 7.2 6.5  HGB 7.8* 7.8*  HCT 25.8* 26.1*  PLT 181 180   BMET:  Recent Labs  09/24/17 1951 09/25/17 0413  NA 140 151*  K 4.0 4.1  CL 113* 123*  CO2 20* 23  GLUCOSE 391* 152*  BUN 49* 50*  CREATININE 1.19 1.07  CALCIUM 6.7* 7.2*    PT/INR:  Recent Labs  09/25/17 0413  LABPROT 23.7*  INR 2.13   ABG    Component Value Date/Time   PHART 7.410 09/20/2017 2220   HCO3 22.8 09/20/2017 2220   TCO2 24 09/20/2017 2220   ACIDBASEDEF 1.0 09/20/2017 2220   O2SAT 100.0 09/20/2017 2220   CBG (last 3)   Recent Labs  09/25/17 0434 09/25/17 0757 09/25/17 1152  GLUCAP 161* 163* 139*    Assessment/Plan: S/P Procedure(s) (LRB): REMOVAL OF R-VENTRICULAR ASSIST DEVICE WITH CARDIOPULMONARY BYPASS (N/A) TRANSESOPHAGEAL ECHOCARDIOGRAM (TEE) (N/A) Cont vent wean - pressure support trials DC EPWs May need PEG with persistent vent dependence Pursue LTAC with call to medical director  LOS: 33 days    Kathlee Nationseter Van Trigt III 09/25/2017

## 2017-09-26 ENCOUNTER — Inpatient Hospital Stay (HOSPITAL_COMMUNITY): Payer: PPO

## 2017-09-26 ENCOUNTER — Inpatient Hospital Stay
Admission: RE | Admit: 2017-09-26 | Discharge: 2017-10-07 | Payer: PPO | Source: Ambulatory Visit | Attending: Internal Medicine | Admitting: Internal Medicine

## 2017-09-26 ENCOUNTER — Other Ambulatory Visit (HOSPITAL_COMMUNITY): Payer: PPO

## 2017-09-26 DIAGNOSIS — R1084 Generalized abdominal pain: Secondary | ICD-10-CM | POA: Diagnosis not present

## 2017-09-26 DIAGNOSIS — L89310 Pressure ulcer of right buttock, unstageable: Secondary | ICD-10-CM | POA: Diagnosis not present

## 2017-09-26 DIAGNOSIS — E1121 Type 2 diabetes mellitus with diabetic nephropathy: Secondary | ICD-10-CM | POA: Diagnosis not present

## 2017-09-26 DIAGNOSIS — Z931 Gastrostomy status: Secondary | ICD-10-CM

## 2017-09-26 DIAGNOSIS — E871 Hypo-osmolality and hyponatremia: Secondary | ICD-10-CM | POA: Diagnosis not present

## 2017-09-26 DIAGNOSIS — K219 Gastro-esophageal reflux disease without esophagitis: Secondary | ICD-10-CM | POA: Diagnosis not present

## 2017-09-26 DIAGNOSIS — Z4659 Encounter for fitting and adjustment of other gastrointestinal appliance and device: Secondary | ICD-10-CM

## 2017-09-26 DIAGNOSIS — R1312 Dysphagia, oropharyngeal phase: Secondary | ICD-10-CM | POA: Diagnosis not present

## 2017-09-26 DIAGNOSIS — J302 Other seasonal allergic rhinitis: Secondary | ICD-10-CM | POA: Diagnosis not present

## 2017-09-26 DIAGNOSIS — R109 Unspecified abdominal pain: Secondary | ICD-10-CM | POA: Diagnosis not present

## 2017-09-26 DIAGNOSIS — K9429 Other complications of gastrostomy: Secondary | ICD-10-CM | POA: Diagnosis not present

## 2017-09-26 DIAGNOSIS — K56609 Unspecified intestinal obstruction, unspecified as to partial versus complete obstruction: Secondary | ICD-10-CM | POA: Diagnosis not present

## 2017-09-26 DIAGNOSIS — F17210 Nicotine dependence, cigarettes, uncomplicated: Secondary | ICD-10-CM | POA: Diagnosis not present

## 2017-09-26 DIAGNOSIS — R1310 Dysphagia, unspecified: Secondary | ICD-10-CM | POA: Diagnosis not present

## 2017-09-26 DIAGNOSIS — K449 Diaphragmatic hernia without obstruction or gangrene: Secondary | ICD-10-CM | POA: Diagnosis not present

## 2017-09-26 DIAGNOSIS — R0602 Shortness of breath: Secondary | ICD-10-CM

## 2017-09-26 DIAGNOSIS — Z8249 Family history of ischemic heart disease and other diseases of the circulatory system: Secondary | ICD-10-CM | POA: Diagnosis not present

## 2017-09-26 DIAGNOSIS — N139 Obstructive and reflux uropathy, unspecified: Secondary | ICD-10-CM | POA: Diagnosis not present

## 2017-09-26 DIAGNOSIS — H8149 Vertigo of central origin, unspecified ear: Secondary | ICD-10-CM | POA: Diagnosis not present

## 2017-09-26 DIAGNOSIS — I110 Hypertensive heart disease with heart failure: Secondary | ICD-10-CM | POA: Diagnosis not present

## 2017-09-26 DIAGNOSIS — Z431 Encounter for attention to gastrostomy: Secondary | ICD-10-CM | POA: Diagnosis not present

## 2017-09-26 DIAGNOSIS — K567 Ileus, unspecified: Secondary | ICD-10-CM | POA: Diagnosis not present

## 2017-09-26 DIAGNOSIS — E119 Type 2 diabetes mellitus without complications: Secondary | ICD-10-CM | POA: Diagnosis not present

## 2017-09-26 DIAGNOSIS — J95851 Ventilator associated pneumonia: Secondary | ICD-10-CM | POA: Diagnosis not present

## 2017-09-26 DIAGNOSIS — K4020 Bilateral inguinal hernia, without obstruction or gangrene, not specified as recurrent: Secondary | ICD-10-CM | POA: Diagnosis not present

## 2017-09-26 DIAGNOSIS — N400 Enlarged prostate without lower urinary tract symptoms: Secondary | ICD-10-CM | POA: Diagnosis not present

## 2017-09-26 DIAGNOSIS — E7800 Pure hypercholesterolemia, unspecified: Secondary | ICD-10-CM | POA: Diagnosis not present

## 2017-09-26 DIAGNOSIS — I214 Non-ST elevation (NSTEMI) myocardial infarction: Secondary | ICD-10-CM | POA: Diagnosis not present

## 2017-09-26 DIAGNOSIS — R41841 Cognitive communication deficit: Secondary | ICD-10-CM | POA: Diagnosis not present

## 2017-09-26 DIAGNOSIS — I25110 Atherosclerotic heart disease of native coronary artery with unstable angina pectoris: Secondary | ICD-10-CM | POA: Diagnosis not present

## 2017-09-26 DIAGNOSIS — J9690 Respiratory failure, unspecified, unspecified whether with hypoxia or hypercapnia: Secondary | ICD-10-CM

## 2017-09-26 DIAGNOSIS — E669 Obesity, unspecified: Secondary | ICD-10-CM | POA: Diagnosis not present

## 2017-09-26 DIAGNOSIS — K631 Perforation of intestine (nontraumatic): Secondary | ICD-10-CM | POA: Diagnosis not present

## 2017-09-26 DIAGNOSIS — E1151 Type 2 diabetes mellitus with diabetic peripheral angiopathy without gangrene: Secondary | ICD-10-CM | POA: Diagnosis not present

## 2017-09-26 DIAGNOSIS — R339 Retention of urine, unspecified: Secondary | ICD-10-CM | POA: Diagnosis not present

## 2017-09-26 DIAGNOSIS — J9621 Acute and chronic respiratory failure with hypoxia: Secondary | ICD-10-CM | POA: Diagnosis not present

## 2017-09-26 DIAGNOSIS — Z9911 Dependence on respirator [ventilator] status: Secondary | ICD-10-CM | POA: Diagnosis not present

## 2017-09-26 DIAGNOSIS — J9601 Acute respiratory failure with hypoxia: Secondary | ICD-10-CM | POA: Diagnosis not present

## 2017-09-26 DIAGNOSIS — E039 Hypothyroidism, unspecified: Secondary | ICD-10-CM | POA: Diagnosis not present

## 2017-09-26 DIAGNOSIS — N183 Chronic kidney disease, stage 3 (moderate): Secondary | ICD-10-CM | POA: Diagnosis not present

## 2017-09-26 DIAGNOSIS — J9600 Acute respiratory failure, unspecified whether with hypoxia or hypercapnia: Secondary | ICD-10-CM | POA: Diagnosis not present

## 2017-09-26 DIAGNOSIS — Z434 Encounter for attention to other artificial openings of digestive tract: Secondary | ICD-10-CM | POA: Diagnosis not present

## 2017-09-26 DIAGNOSIS — I130 Hypertensive heart and chronic kidney disease with heart failure and stage 1 through stage 4 chronic kidney disease, or unspecified chronic kidney disease: Secondary | ICD-10-CM | POA: Diagnosis not present

## 2017-09-26 DIAGNOSIS — I240 Acute coronary thrombosis not resulting in myocardial infarction: Secondary | ICD-10-CM | POA: Diagnosis not present

## 2017-09-26 DIAGNOSIS — Z4682 Encounter for fitting and adjustment of non-vascular catheter: Secondary | ICD-10-CM | POA: Diagnosis not present

## 2017-09-26 DIAGNOSIS — D631 Anemia in chronic kidney disease: Secondary | ICD-10-CM | POA: Diagnosis not present

## 2017-09-26 DIAGNOSIS — I4891 Unspecified atrial fibrillation: Secondary | ICD-10-CM | POA: Diagnosis not present

## 2017-09-26 DIAGNOSIS — I4892 Unspecified atrial flutter: Secondary | ICD-10-CM | POA: Diagnosis not present

## 2017-09-26 DIAGNOSIS — D62 Acute posthemorrhagic anemia: Secondary | ICD-10-CM | POA: Diagnosis not present

## 2017-09-26 DIAGNOSIS — E785 Hyperlipidemia, unspecified: Secondary | ICD-10-CM | POA: Diagnosis not present

## 2017-09-26 DIAGNOSIS — I25810 Atherosclerosis of coronary artery bypass graft(s) without angina pectoris: Secondary | ICD-10-CM | POA: Diagnosis not present

## 2017-09-26 DIAGNOSIS — J9611 Chronic respiratory failure with hypoxia: Secondary | ICD-10-CM | POA: Diagnosis not present

## 2017-09-26 DIAGNOSIS — Z930 Tracheostomy status: Secondary | ICD-10-CM | POA: Diagnosis not present

## 2017-09-26 DIAGNOSIS — K668 Other specified disorders of peritoneum: Secondary | ICD-10-CM | POA: Diagnosis not present

## 2017-09-26 DIAGNOSIS — G9340 Encephalopathy, unspecified: Secondary | ICD-10-CM | POA: Diagnosis not present

## 2017-09-26 DIAGNOSIS — Z430 Encounter for attention to tracheostomy: Secondary | ICD-10-CM | POA: Diagnosis not present

## 2017-09-26 DIAGNOSIS — N182 Chronic kidney disease, stage 2 (mild): Secondary | ICD-10-CM | POA: Diagnosis not present

## 2017-09-26 DIAGNOSIS — F17200 Nicotine dependence, unspecified, uncomplicated: Secondary | ICD-10-CM | POA: Diagnosis not present

## 2017-09-26 DIAGNOSIS — J90 Pleural effusion, not elsewhere classified: Secondary | ICD-10-CM | POA: Diagnosis not present

## 2017-09-26 DIAGNOSIS — R918 Other nonspecific abnormal finding of lung field: Secondary | ICD-10-CM | POA: Diagnosis not present

## 2017-09-26 DIAGNOSIS — N179 Acute kidney failure, unspecified: Secondary | ICD-10-CM | POA: Diagnosis not present

## 2017-09-26 DIAGNOSIS — R570 Cardiogenic shock: Secondary | ICD-10-CM | POA: Diagnosis not present

## 2017-09-26 DIAGNOSIS — L89322 Pressure ulcer of left buttock, stage 2: Secondary | ICD-10-CM | POA: Diagnosis not present

## 2017-09-26 DIAGNOSIS — J431 Panlobular emphysema: Secondary | ICD-10-CM | POA: Diagnosis not present

## 2017-09-26 DIAGNOSIS — I2510 Atherosclerotic heart disease of native coronary artery without angina pectoris: Secondary | ICD-10-CM | POA: Diagnosis not present

## 2017-09-26 DIAGNOSIS — E1142 Type 2 diabetes mellitus with diabetic polyneuropathy: Secondary | ICD-10-CM | POA: Diagnosis not present

## 2017-09-26 DIAGNOSIS — I509 Heart failure, unspecified: Secondary | ICD-10-CM | POA: Diagnosis not present

## 2017-09-26 DIAGNOSIS — Z951 Presence of aortocoronary bypass graft: Secondary | ICD-10-CM | POA: Diagnosis not present

## 2017-09-26 DIAGNOSIS — Z794 Long term (current) use of insulin: Secondary | ICD-10-CM | POA: Diagnosis not present

## 2017-09-26 DIAGNOSIS — E118 Type 2 diabetes mellitus with unspecified complications: Secondary | ICD-10-CM | POA: Diagnosis not present

## 2017-09-26 DIAGNOSIS — I482 Chronic atrial fibrillation: Secondary | ICD-10-CM | POA: Diagnosis not present

## 2017-09-26 DIAGNOSIS — M6281 Muscle weakness (generalized): Secondary | ICD-10-CM | POA: Diagnosis not present

## 2017-09-26 DIAGNOSIS — E46 Unspecified protein-calorie malnutrition: Secondary | ICD-10-CM | POA: Diagnosis not present

## 2017-09-26 DIAGNOSIS — J449 Chronic obstructive pulmonary disease, unspecified: Secondary | ICD-10-CM | POA: Diagnosis not present

## 2017-09-26 DIAGNOSIS — L89150 Pressure ulcer of sacral region, unstageable: Secondary | ICD-10-CM | POA: Diagnosis not present

## 2017-09-26 DIAGNOSIS — Z6825 Body mass index (BMI) 25.0-25.9, adult: Secondary | ICD-10-CM | POA: Diagnosis not present

## 2017-09-26 DIAGNOSIS — I5032 Chronic diastolic (congestive) heart failure: Secondary | ICD-10-CM | POA: Diagnosis not present

## 2017-09-26 DIAGNOSIS — I10 Essential (primary) hypertension: Secondary | ICD-10-CM | POA: Diagnosis not present

## 2017-09-26 DIAGNOSIS — L89810 Pressure ulcer of head, unstageable: Secondary | ICD-10-CM | POA: Diagnosis not present

## 2017-09-26 DIAGNOSIS — S21109A Unspecified open wound of unspecified front wall of thorax without penetration into thoracic cavity, initial encounter: Secondary | ICD-10-CM | POA: Diagnosis not present

## 2017-09-26 DIAGNOSIS — E875 Hyperkalemia: Secondary | ICD-10-CM | POA: Diagnosis not present

## 2017-09-26 DIAGNOSIS — J189 Pneumonia, unspecified organism: Secondary | ICD-10-CM | POA: Diagnosis not present

## 2017-09-26 LAB — CBC WITH DIFFERENTIAL/PLATELET
BASOS PCT: 0 %
Basophils Absolute: 0 10*3/uL (ref 0.0–0.1)
EOS ABS: 0.3 10*3/uL (ref 0.0–0.7)
EOS PCT: 4 %
HEMATOCRIT: 24 % — AB (ref 39.0–52.0)
Hemoglobin: 7.3 g/dL — ABNORMAL LOW (ref 13.0–17.0)
Lymphocytes Relative: 22 %
Lymphs Abs: 1.5 10*3/uL (ref 0.7–4.0)
MCH: 29.8 pg (ref 26.0–34.0)
MCHC: 30.4 g/dL (ref 30.0–36.0)
MCV: 98 fL (ref 78.0–100.0)
MONO ABS: 0.2 10*3/uL (ref 0.1–1.0)
Monocytes Relative: 3 %
NEUTROS ABS: 4.8 10*3/uL (ref 1.7–7.7)
Neutrophils Relative %: 71 %
PLATELETS: 175 10*3/uL (ref 150–400)
RBC: 2.45 MIL/uL — ABNORMAL LOW (ref 4.22–5.81)
RDW: 17.1 % — AB (ref 11.5–15.5)
WBC: 6.8 10*3/uL (ref 4.0–10.5)

## 2017-09-26 LAB — PROTIME-INR
INR: 2.26
Prothrombin Time: 24.8 seconds — ABNORMAL HIGH (ref 11.4–15.2)

## 2017-09-26 LAB — RENAL FUNCTION PANEL
Albumin: 1.7 g/dL — ABNORMAL LOW (ref 3.5–5.0)
Anion gap: 5 (ref 5–15)
BUN: 47 mg/dL — ABNORMAL HIGH (ref 6–20)
CALCIUM: 7 mg/dL — AB (ref 8.9–10.3)
CHLORIDE: 113 mmol/L — AB (ref 101–111)
CO2: 22 mmol/L (ref 22–32)
Creatinine, Ser: 1.06 mg/dL (ref 0.61–1.24)
GLUCOSE: 324 mg/dL — AB (ref 65–99)
POTASSIUM: 4 mmol/L (ref 3.5–5.1)
Phosphorus: 3.1 mg/dL (ref 2.5–4.6)
Sodium: 140 mmol/L (ref 135–145)

## 2017-09-26 LAB — MAGNESIUM: Magnesium: 2.7 mg/dL — ABNORMAL HIGH (ref 1.7–2.4)

## 2017-09-26 LAB — GLUCOSE, CAPILLARY
GLUCOSE-CAPILLARY: 198 mg/dL — AB (ref 65–99)
GLUCOSE-CAPILLARY: 188 mg/dL — AB (ref 65–99)
GLUCOSE-CAPILLARY: 142 mg/dL — AB (ref 65–99)
Glucose-Capillary: 163 mg/dL — ABNORMAL HIGH (ref 65–99)

## 2017-09-26 MED ORDER — LEVALBUTEROL HCL 0.63 MG/3ML IN NEBU
0.6300 mg | INHALATION_SOLUTION | Freq: Four times a day (QID) | RESPIRATORY_TRACT | 12 refills | Status: DC
Start: 2017-09-26 — End: 2017-12-26

## 2017-09-26 MED ORDER — INSULIN GLARGINE 100 UNIT/ML ~~LOC~~ SOLN: 30.0000 [IU] | Freq: Two times a day (BID) | SUBCUTANEOUS | 11 refills | Status: DC

## 2017-09-26 MED ORDER — METOPROLOL TARTRATE 5 MG/5ML IV SOLN: 2.5000 mg | INTRAVENOUS | 2 refills | Status: DC | PRN

## 2017-09-26 MED ORDER — POTASSIUM CHLORIDE 10 MEQ/50ML IV SOLN: 10.0000 meq | INTRAVENOUS | 4 refills | Status: DC | PRN

## 2017-09-26 MED ORDER — ZINC OXIDE 12.8 % EX OINT: TOPICAL_OINTMENT | CUTANEOUS | 0 refills | Status: DC | PRN

## 2017-09-26 MED ORDER — CHLORHEXIDINE GLUCONATE CLOTH 2 % EX PADS: 6.0000 | MEDICATED_PAD | Freq: Every day | CUTANEOUS | 2 refills | Status: DC

## 2017-09-26 MED ORDER — VANCOMYCIN HCL 500 MG IV SOLR: 500.0000 mg | Freq: Two times a day (BID) | INTRAVENOUS | 14 refills | Status: DC

## 2017-09-26 MED ORDER — PRO-STAT SUGAR FREE PO LIQD: 30.0000 mL | Freq: Three times a day (TID) | ORAL | 0 refills | Status: DC

## 2017-09-26 MED ORDER — MIDAZOLAM HCL 2 MG/2ML IJ SOLN
2.0000 mg | INTRAMUSCULAR | 0 refills | Status: DC | PRN
Start: 2017-09-26 — End: 2017-12-26

## 2017-09-26 MED ORDER — MUPIROCIN CALCIUM 2 % EX CREA: TOPICAL_CREAM | Freq: Two times a day (BID) | CUTANEOUS | 0 refills | Status: DC

## 2017-09-26 MED ORDER — SODIUM CHLORIDE 0.9% FLUSH: 10.0000 mL | INTRAVENOUS | 1 refills | Status: DC | PRN

## 2017-09-26 MED ORDER — FUROSEMIDE 10 MG/ML IJ SOLN: 40.0000 mg | Freq: Two times a day (BID) | INTRAMUSCULAR | 0 refills | Status: DC

## 2017-09-26 MED ORDER — ZINC OXIDE 12.8 % EX OINT
TOPICAL_OINTMENT | CUTANEOUS | Status: DC | PRN
  Administered 2017-09-26: 14:00:00 via TOPICAL
  Filled 2017-09-26: qty 56.7

## 2017-09-26 MED ORDER — WHITE PETROLATUM EX OINT
TOPICAL_OINTMENT | Freq: Two times a day (BID) | CUTANEOUS | Status: DC
  Administered 2017-09-26: 14:00:00 via TOPICAL
  Filled 2017-09-26: qty 28.35

## 2017-09-26 MED ORDER — CHLORHEXIDINE GLUCONATE 0.12% ORAL RINSE (MEDLINE KIT): 15.0000 mL | Freq: Two times a day (BID) | OROMUCOSAL | 0 refills | Status: DC

## 2017-09-26 MED ORDER — ORAL CARE MOUTH RINSE: 15.0000 mL | Freq: Four times a day (QID) | OROMUCOSAL | 1 refills | Status: DC

## 2017-09-26 MED ORDER — IPRATROPIUM BROMIDE 0.02 % IN SOLN: 0.5000 mg | Freq: Four times a day (QID) | RESPIRATORY_TRACT | 12 refills | Status: DC

## 2017-09-26 MED ORDER — ONDANSETRON HCL 4 MG/2ML IJ SOLN: 4.0000 mg | Freq: Four times a day (QID) | INTRAMUSCULAR | 0 refills | Status: DC | PRN

## 2017-09-26 MED ORDER — SODIUM CHLORIDE 0.9 % IV SOLN: 1.0000 g | Freq: Three times a day (TID) | INTRAVENOUS | 1 refills | Status: AC

## 2017-09-26 MED ORDER — SODIUM CHLORIDE 0.9 % IV SOLN: 100.0000 mg | Freq: Every day | INTRAVENOUS | 1 refills | Status: DC

## 2017-09-26 MED ORDER — FLORANEX PO PACK: 1.0000 g | PACK | Freq: Three times a day (TID) | ORAL | 1 refills | Status: DC

## 2017-09-26 MED ORDER — VITAL 1.5 CAL PO LIQD: 1000.0000 mL | ORAL | 30 refills | Status: DC

## 2017-09-26 MED ORDER — WARFARIN SODIUM 3 MG PO TABS: 3.0000 mg | ORAL_TABLET | Freq: Once | ORAL | 1 refills | Status: DC

## 2017-09-26 MED ORDER — FENTANYL CITRATE (PF) 100 MCG/2ML IJ SOLN: 50.0000 ug | INTRAMUSCULAR | 0 refills | Status: DC | PRN

## 2017-09-26 MED ORDER — BUDESONIDE 0.25 MG/2ML IN SUSP: 0.2500 mg | Freq: Two times a day (BID) | RESPIRATORY_TRACT | 12 refills | Status: AC

## 2017-09-26 MED ORDER — WARFARIN SODIUM 3 MG PO TABS
3.0000 mg | ORAL_TABLET | Freq: Once | ORAL | Status: AC
  Administered 2017-09-26: 3 mg via ORAL
  Filled 2017-09-26: qty 1

## 2017-09-26 MED ORDER — PANTOPRAZOLE SODIUM 40 MG PO PACK: 40.0000 mg | PACK | Freq: Every day | ORAL | 4 refills | Status: DC

## 2017-09-26 MED ORDER — ATORVASTATIN CALCIUM 80 MG PO TABS: 80.0000 mg | ORAL_TABLET | Freq: Every day | ORAL | 1 refills | Status: DC

## 2017-09-26 MED ORDER — SODIUM CHLORIDE 0.9% FLUSH: 3.0000 mL | Freq: Two times a day (BID) | INTRAVENOUS | 1 refills | Status: DC

## 2017-09-26 MED ORDER — SODIUM CHLORIDE 0.9 % IV SOLN: 10.0000 mL | INTRAVENOUS | 0 refills | Status: DC

## 2017-09-26 MED ORDER — INSULIN ASPART 100 UNIT/ML ~~LOC~~ SOLN: 0.0000 [IU] | SUBCUTANEOUS | 11 refills | Status: DC

## 2017-09-26 MED ORDER — SPIRONOLACTONE 25 MG PO TABS: 25.0000 mg | ORAL_TABLET | Freq: Every day | ORAL | 1 refills | Status: DC

## 2017-09-26 MED ORDER — SODIUM CHLORIDE 0.9% FLUSH: 3.0000 mL | INTRAVENOUS | 1 refills | Status: DC | PRN

## 2017-09-26 MED ORDER — WHITE PETROLATUM EX OINT: 1.0000 "application " | TOPICAL_OINTMENT | Freq: Two times a day (BID) | CUTANEOUS | 0 refills | Status: DC

## 2017-09-26 MED ORDER — COLLAGENASE 250 UNIT/GM EX OINT: TOPICAL_OINTMENT | Freq: Every day | CUTANEOUS | 0 refills | Status: DC

## 2017-09-26 MED ORDER — SODIUM CHLORIDE 0.9% FLUSH: 10.0000 mL | Freq: Two times a day (BID) | INTRAVENOUS | 1 refills | Status: DC

## 2017-09-26 MED ORDER — FUROSEMIDE 10 MG/ML IJ SOLN
40.0000 mg | Freq: Two times a day (BID) | INTRAMUSCULAR | Status: DC
  Administered 2017-09-26 (×2): 40 mg via INTRAVENOUS
  Filled 2017-09-26 (×2): qty 4

## 2017-09-26 MED ORDER — OXYCODONE HCL 5 MG PO TABS: 5.0000 mg | ORAL_TABLET | ORAL | 0 refills | Status: DC | PRN

## 2017-09-26 NOTE — Progress Notes (Signed)
  Speech Language Pathology  Patient Details Name: Clinton HammockReginald E Harrington MRN: [id number] DOB: [birth year] Today's Date: 09/26/2017 Time:  -     FEES swallow assessment scheduled for 10:00 this am              Royce MacadamiaLitaker, Ercell Perlman Willis 09/26/2017, 8:47 AM Breck CoonsLisa Willis Lonell FaceLitaker M.Ed [employer] Pager [phone]

## 2017-09-26 NOTE — Progress Notes (Signed)
Patient ID: Clinton Harrington, male   DOB: December 21, 1950, 66 y.o.   MRN: [id number]        Advanced Heart Failure Rounding Note   Subjective:    Events CABG x4 IABP Centrimag 9/20    IABP removed 9/24.  Centrimag RVAD in place DC-CV successful 9/29  Centrimag removal 10/1 Self-extubated 10/8 Re-intubated 10/9 S/P Tach 10/10 Recultured 10/11  Yesterday antibiotics broadened. Continued to diurese on IV lasix. Weight up. Remain on 30% Trac Collar.  Denies pain.    ECHO 09/17/2017  Left ventricle: The cavity size was normal. There was moderate   concentric hypertrophy. Systolic function was normal. The   estimated ejection fraction was in the range of 60% to 65%.   Although no diagnostic regional wall motion abnormality was   identified, this possibility cannot be completely excluded on the   basis of this study. Doppler parameters are consistent with   abnormal left ventricular relaxation (grade 1 diastolic   dysfunction). Doppler parameters are consistent with   indeterminate mean left atrial filling pressure. - Ventricular septum: Septal motion showed paradox. - Aortic valve: There was mild stenosis. Valve area (VTI): 1.99   cm^2. Valve area (Vmax): 1.99 cm^2. Valve area (Vmean): 1.99   cm^2. - Right ventricle: Systolic function was moderately reduced. Impressions: - Compared to 09/05/2017, the right ventricle appears less dilated   and there is some improvement in RV function.   Objective:   Weight Range: 202 lb (91.6 kg) Body mass index is 28.17 kg/m.   Vital Signs:   Temp:  [99 F (37.2 C)-100.9 F (38.3 C)] 99.3 F (37.4 C) (10/19 0344) Pulse Rate:  [85-94] 89 (10/19 0600) Resp:  [17-40] 27 (10/19 0600) BP: (96-127)/(59-73) 127/65 (10/19 0600) SpO2:  [95 %-100 %] 100 % (10/19 0600) FiO2 (%):  [30 %] 30 % (10/19 0400) Weight:  [202 lb (91.6 kg)] 202 lb (91.6 kg) (10/19 0344) Last BM Date: 09/25/17  Weight change: Filed Weights   09/24/17 0400  09/25/17 0500 09/26/17 0344  Weight: 188 lb 4.4 oz (85.4 kg) 193 lb (87.5 kg) 202 lb (91.6 kg)    Intake/Output:   Intake/Output Summary (Last 24 hours) at 09/26/17 0713 Last data filed at 09/26/17 0600  Gross per 24 hour  Intake             4650 ml  Output             2525 ml  Net             2125 ml      Physical Exam  CVP ~10 General:  Trach. No resp difficulty HEENT: normal Neck: supple. JVP ~10. Carotids 2+ bilat; no bruits. No lymphadenopathy or thryomegaly appreciated. Cor: PMI nondisplaced. Regular rate & rhythm. No rubs, gallops or murmurs. Lungs: coarse throughout Abdomen: soft, nontender, nondistended. No hepatosplenomegaly. No bruits or masses. Good bowel sounds. Extremities: no cyanosis, clubbing, rash, edema. RUE PICC  Neuro: alert & orientedx3, cranial nerves grossly intact. moves all 4 extremities w/o difficulty. Affect pleasant   Telemetry   NSR 80-90s    Labs    CBC  Recent Labs  09/25/17 0413 09/26/17 0341  WBC 6.5 6.8  NEUTROABS  --  4.8  HGB 7.8* 7.3*  HCT 26.1* 24.0*  MCV 98.9 98.0  PLT 180 283   Basic Metabolic Panel  Recent Labs  09/25/17 0413 09/25/17 1630 09/26/17 0341  NA 151* 146* 140  K 4.1 4.4 4.0  CL 123* 116*  113*  CO2 _0 GLUCOSE 152* 217* 324*  BUN 50* 50* 47*  CREATININE 1.07 1.08 1.06  CALCIUM 7.2* 7.1* 7.0*  MG 3.0*  --  2.7*  PHOS 3.0  --  3.1   Liver Function Tests  Recent Labs  09/26/17 0341  ALBUMIN 1.7*   No results for input(s): LIPASE, AMYLASE in the last 72 hours. Cardiac Enzymes No results for input(s): CKTOTAL, CKMB, CKMBINDEX, TROPONINI in the last 72 hours.  BNP: BNP (last 3 results)  Recent Labs  08/23/17 1927  BNP 200.0*    ProBNP (last 3 results) No results for input(s): PROBNP in the last 8760 hours.   D-Dimer No results for input(s): DDIMER in the last 72 hours. Hemoglobin A1C No results for input(s): HGBA1C in the last 72 hours. Fasting Lipid Panel No results for  input(s): CHOL, HDL, LDLCALC, TRIG, CHOLHDL, LDLDIRECT in the last 72 hours. Thyroid Function Tests No results for input(s): TSH, T4TOTAL, T3FREE, THYROIDAB in the last 72 hours.  Invalid input(s): FREET3  Other results:   Imaging    No results found.   Medications:     Scheduled Medications: . atorvastatin  80 mg Per Tube q1800  . brimonidine  1 drop Right Eye BID  . budesonide (PULMICORT) nebulizer solution  0.25 mg Nebulization BID  . chlorhexidine gluconate (MEDLINE KIT)  15 mL Mouth Rinse BID  . Chlorhexidine Gluconate Cloth  6 each Topical Daily  . collagenase   Topical Daily  . feeding supplement (PRO-STAT SUGAR FREE 64)  30 mL Per Tube TID  . free water  400 mL Per Tube Q6H  . insulin aspart  0-24 Units Subcutaneous Q4H  . insulin glargine  30 Units Subcutaneous BID  . ipratropium  0.5 mg Nebulization Q6H  . lactobacillus  1 g Oral TID WC  . levalbuterol  0.63 mg Nebulization Q6H  . mouth rinse  15 mL Mouth Rinse QID  . mupirocin cream   Topical BID  . pantoprazole sodium  40 mg Per Tube Daily  . sodium chloride flush  10-40 mL Intracatheter Q12H  . sodium chloride flush  3 mL Intravenous Q12H  . spironolactone  25 mg Per Tube Daily  . warfarin  3 mg Oral ONCE-1800  . Warfarin - Pharmacist Dosing Inpatient   Does not apply q1800    Infusions: . sodium chloride Stopped (09/24/17 2200)  . anidulafungin (ERAXIS) 100 mg IVPB Stopped (09/25/17 1657)  . dextrose 50 mL/hr at 09/26/17 0600  . feeding supplement (VITAL 1.5 CAL) 1,000 mL (09/26/17 0600)  . meropenem (MERREM) IV Stopped (09/26/17 0541)  . potassium chloride 10 mEq (09/22/17 6283)  . vancomycin Stopped (09/26/17 0338)    PRN Medications: acetaminophen (TYLENOL) oral liquid 160 mg/5 mL, fentaNYL (SUBLIMAZE) injection, metoprolol tartrate, midazolam, ondansetron (ZOFRAN) IV, oxyCODONE, potassium chloride, sodium chloride flush, sodium chloride flush    Patient Profile   Clinton Harrington is a 66 year old  with a history of DMII, smoker, CAD S/P PCI RCA 20 years ago, HTN, PAD, and hyperlipidemia admitted with CP and inferior MI with RV infarction.  He underwent LHC with IABP. After optimized, underwent CABG x4 centrimag for RV infarct on 9/20 and continued IABP.   Assessment/Plan   1. Post-cardiotomy cardiogenic shock: Primarily RV failure from inferior infarct with RV involvement.  Repeat echo on 9/24 showed vigorous LV systolic function with moderate RV dysfunction.  Now s/p CABG and Centrimag for RV support on 9/20. IABP removed 9/24 with  good LV function.  Centrimag out 10/1.  ECHO 09/17/2017 EF 60-65%, grade I DD, RV with some improvement/less dilated. SBP stable off pressors. Renal function stable.  - Continue spiro to 25 mg daily.  - CVP ~10. Weight up but he is on a different bed. Only received 1 dose of IV lasix. Increase lasix back to 40 mg IV twice a day.      2. CAD: Inferior MI with RV involvement at presentation, now s/p CABG.   - Continue asa to 81 mg daily.  - Continue statin.  3. Atrial fibrillation/flutter: DCCV 9/29, now remains in NSR.  - Off amiodarone per Dr Darcey Nora. ? Drug fever.  - On warfarin.  INR 2.3 4. AKI: Resolved.  5. Acute respiratory failure: Suspect left-sided HCAP. Self-extubated 09/15/17. Had resp distress overnight into 09/16/17, remained weak requiring re-intubation.  S/P Trach 09/17/2017. On 30%.  - Per respiratory therapy. Continuing to try and wean.  - Abx broadened.   6. Neuro: ? L-sided weakness now resolved.  Head CT 10/5 negative.  Resolved.   7. ID: RLL PNA on chest CT 10/15, HCAP.  Suspect hypotension is septic/vasogenic shock => resolved, off pressors.  Has pressure ulcer that could be source as well.  Tmax 100.2.   - Restarted vancomycin, anidulafungin, and meropenem.    8. Anemia: Received 2 uPRBCs 09/04/17. 4 units PRBCs on 9/29. Received 2U PRBCs 10/1.    He received IV Fe on 10/11.  Hgb down to 7.3  10. Unstageable Pressure Ulcer - R Buttock  partial thickness. Left buttock with slough. Continue santyl to left buttock for selective debridement.  - Continue reposition. On tube feeds. WOC to reassess today. PT to start hydrotherapy.  11. Diabetes: Better controlled with addition of lantus.  12. Deconditioning: PT consulted. CIR recommended.  ? LTAC 13. Hypernatremia: Sodium 140.  On D5W 50 per hour.  - Stop D5W, can continue free water boluses.  14. Urinary Retention- Requiring I&O cath.      Darrick Grinder, NP  09/26/2017    Advanced Heart Failure Team Pager [phone] (M-F; 7a - 4p)  Please contact [facility] for night-coverage after hours (4p -7a ) and weekends on [email or web address]  Patient seen with NP, agree with the above note.  Still with low grade fever to 100.2. Antimicrobials broadened yesterday and amiodarone stopped for ?drug fever. BP stable off pressors.   CVP around 10, will give Lasix 40 mg IV bid for now.  Creatinine stable.   Sodium much improved.  Can continue free water boluses but stop D5W.   Continue attempts at trach weaning.  Will need LTAC it appears.   Loralie Champagne 09/26/2017 7:39 AM

## 2017-09-26 NOTE — Progress Notes (Signed)
Pt transported to 5710 without complications

## 2017-09-26 NOTE — Progress Notes (Signed)
Attempted to wean patient on 18/5 PSV with no success. Patient had sustained respirations in the 40's and VT registering approximately 200 ml less than set VT in PRVC. RT will attempt weaning later in the day.

## 2017-09-26 NOTE — Progress Notes (Signed)
Report called to Johnny BridgeMartha, [facility] [phone], pt's dgt Candi LeashGreta Wentz called [phone] and updated with discharge.

## 2017-09-26 NOTE — Consult Note (Signed)
WOC Nurse wound follow up Wound type: Unstageable pressure injury; left buttock Stage 2 pressure injury; right buttock Unstageable pressure injury; occiput  New: Unstageable pressure injury; anus, device related pressure injury Measurement: Left buttock; 7cm x 7cm x 0.1cm; partial thickness; clean, 100% pink Right buttock: 7cm x 5cm x 0.1cm; 90% soft black/yellow/10% pink at wound edges  Occiput: 9cm x 2cm x 0.1cm; 40% black/20% yellow/20% pink Anus: unable to measure; 100% soft black/grey Wound bed: see above Drainage (amount, consistency, odor) no significant drainage from any of the above sites  Periwound: intact  Dressing procedure/placement/frequency: Add triple paste around the FMS to prevent the tube from lying on one spot and worsening pressure injury Continue enzymatic debridement to the left buttock Foam to the right Add Vaseline to the occiput to keep soft, notify WOC nurse if worsening Using foam prophylactic under the trach  Prevalon boots to offload vunerable heels.   WOC Nurse team will follow along with you for weekly wound assessments.  Please notify me of any acute changes in the wounds or any new areas of concerns Rylin Seavey [facility] MSN, RN,CWOCN, CNS, CWON-AP [phone]

## 2017-09-26 NOTE — Procedures (Signed)
Objective Swallowing Evaluation: Type of Study: FEES-Fiberoptic Endoscopic Evaluation of Swallow  Patient Details  Name: Clinton Harrington MRN: [id number] Date of Birth: 1951/01/28  Today's Date: 09/26/2017 Time: SLP Start Time (ACUTE ONLY): 1007-SLP Stop Time (ACUTE ONLY): 1027 SLP Time Calculation (min) (ACUTE ONLY): 20 min  Past Medical History:  Past Medical History:  Diagnosis Date  . Arthritis   . Diabetes (HCC)   . Diabetes mellitus Jan. 1999  . High cholesterol   . Hypertension   . Myocardial infarct (HCC)   . Peripheral vascular disease (HCC)   . Seasonal allergies   . Smoker   . Urine incontinence    Past Surgical History:  Past Surgical History:  Procedure Laterality Date  . CORONARY ARTERY BYPASS GRAFT N/A 08/28/2017   Procedure: CORONARY ARTERY BYPASS GRAFTING (CABG) times four using left internal mammary artery and right greater saphenous leg vein using endoscope.;  Surgeon: Kerin Perna, MD;  Location: [facility] OR;  Service: Open Heart Surgery;  Laterality: N/A;  . CORONARY STENT PLACEMENT  Jan.  1999  . FEMORAL-POPLITEAL BYPASS GRAFT Left 01/03/2016   Procedure: LEFT COMMON FEMORAL-BELOW KNEE POPLITEAL ARTERY BYPASS GRAFT USING NON REVERSED TRANSLOCATED SAPHENOUS VEIN GRAFT;  Surgeon: Pryor Ochoa, MD;  Location: [facility] OR;  Service: Vascular;  Laterality: Left;  . IABP INSERTION N/A 08/23/2017   Procedure: IABP Insertion;  Surgeon: Runell Gess, MD;  Location: [facility] INVASIVE CV LAB;  Service: Cardiovascular;  Laterality: N/A;  . INTRAOPERATIVE ARTERIOGRAM Left 01/03/2016   Procedure: INTRA OPERATIVE ARTERIOGRAM LEFT LEG;  Surgeon: Pryor Ochoa, MD;  Location: [facility] OR;  Service: Vascular;  Laterality: Left;  . LEFT HEART CATH AND CORS/GRAFTS ANGIOGRAPHY N/A 08/23/2017   Procedure: LEFT HEART CATH AND CORS/GRAFTS ANGIOGRAPHY;  Surgeon: Runell Gess, MD;  Location: [facility] INVASIVE CV LAB;  Service: Cardiovascular;  Laterality: N/A;  . PERIPHERAL VASCULAR CATHETERIZATION  N/A 01/01/2016   Procedure: Abdominal Aortogram w/Lower Extremity;  Surgeon: Fransisco Hertz, MD;  Location: [facility] INVASIVE CV LAB;  Service: Cardiovascular;  Laterality: N/A;  . PERIPHERAL VASCULAR CATHETERIZATION Left 01/01/2016   Procedure: Peripheral Vascular Balloon Angioplasty;  Surgeon: Fransisco Hertz, MD;  Location: [facility] INVASIVE CV LAB;  Service: Cardiovascular;  Laterality: Left;  Popliteal artery  . PERIPHERAL VASCULAR CATHETERIZATION N/A 12/04/2016   Procedure: Abdominal Aortogram w/Lower Extremity;  Surgeon: Maeola Harman, MD;  Location: [facility] INVASIVE CV LAB;  Service: Cardiovascular;  Laterality: N/A;  . PERIPHERAL VASCULAR CATHETERIZATION Left 12/04/2016   Procedure: Peripheral Vascular Balloon Angioplasty;  Surgeon: Maeola Harman, MD;  Location: [facility] INVASIVE CV LAB;  Service: Cardiovascular;  Laterality: Left;  Fem_Pop bypass  . PLACEMENT OF CENTRIMAG VENTRICULAR ASSIST DEVICE Right 08/28/2017   Procedure: PLACEMENT OF CENTRIMAG VENTRICULAR ASSIST DEVICE;  Surgeon: Kerin Perna, MD;  Location: [facility] OR;  Service: Open Heart Surgery;  Laterality: Right;  . REMOVAL OF CENTRIMAG VENTRICULAR ASSIST DEVICE N/A 09/08/2017   Procedure: REMOVAL OF R-VENTRICULAR ASSIST DEVICE WITH CARDIOPULMONARY BYPASS;  Surgeon: Kerin Perna, MD;  Location: [facility] OR;  Service: Open Heart Surgery;  Laterality: N/A;  . TEE WITHOUT CARDIOVERSION N/A 08/28/2017   Procedure: TRANSESOPHAGEAL ECHOCARDIOGRAM (TEE);  Surgeon: Donata Clay, Theron Arista, MD;  Location: [facility] OR;  Service: Open Heart Surgery;  Laterality: N/A;  . TEE WITHOUT CARDIOVERSION N/A 09/08/2017   Procedure: TRANSESOPHAGEAL ECHOCARDIOGRAM (TEE);  Surgeon: Donata Clay, Theron Arista, MD;  Location: [facility] OR;  Service: Open Heart Surgery;  Laterality: N/A;   HPI: Patient is a 66 y/o  Caucasian M with hx CAD, significant PVD, HTN, Type 2 DM, HLP (hyperkeratosis lenticularis perstans), tobacco use comes in today complaining of substernal chest pain. Found to have RV  infarct requiring emergent bypass surgery. Intubated 9/22 and self extubated 10/8, reintubated 10/9 and trach'd 10/10. Pt has been able to tolerate limited weaning to PSV. SLP reviewed chart with plan to request inline PMSV if pt unable to wean to trach collar. Received order for swallow assessment. Did not proceed with inline valve due to inability for RT and SLP to be present with meals  3 times a day wearing PMV. Proceeded with FEES. Plans for transfer soon to [facility].  No Data Recorded   Assessment / Plan / Recommendation  CHL IP CLINICAL IMPRESSIONS 09/26/2017  Clinical Impression Pt seen for FEES on ventilator PRVC mode. Despite RN suctioning just prior to po initiation, copious secretions observed subglottaly, in valleculae and pyriform sinuses with inability to clear due to full vent support with inflated cuff.  Ice chip trials resulted in laryngeal penetration to vocal cords with eventual aspiration observed. Suspect decreased laryngeal elevation and epiglottic inversion for airway protection with swallow triggering effectively based on limited trials although small amout prematurly spilling to pyriform sinuses. This assessment is early in his recovery and it is hopefull he can wean to trach collar, use Passy-Muir speaking valve, establish forceful cough and strength to initiate po's. Will defer treatment plan to SLP in LTAC.   SLP Visit Diagnosis Dysphagia, pharyngeal phase (R13.13)  Attention and concentration deficit following --  Frontal lobe and executive function deficit following --  Impact on safety and function Severe aspiration risk      CHL IP TREATMENT RECOMMENDATION 09/26/2017  Treatment Recommendations Defer treatment plan to f/u with SLP     No flowsheet data found.  CHL IP DIET RECOMMENDATION 09/26/2017  SLP Diet Recommendations NPO  Liquid Administration via --  Medication Administration Via alternative means  Compensations --  Postural Changes --      CHL IP  OTHER RECOMMENDATIONS 09/26/2017  Recommended Consults --  Oral Care Recommendations Oral care QID  Other Recommendations --      CHL IP FOLLOW UP RECOMMENDATIONS 09/26/2017  Follow up Recommendations [facility]      No flowsheet data found.         CHL IP ORAL PHASE 09/26/2017  Oral Phase WFL  Oral - Pudding Teaspoon --  Oral - Pudding Cup --  Oral - Honey Teaspoon --  Oral - Honey Cup --  Oral - Nectar Teaspoon --  Oral - Nectar Cup --  Oral - Nectar Straw --  Oral - Thin Teaspoon --  Oral - Thin Cup --  Oral - Thin Straw --  Oral - Puree --  Oral - Mech Soft --  Oral - Regular --  Oral - Multi-Consistency --  Oral - Pill --  Oral Phase - Comment --    CHL IP PHARYNGEAL PHASE 09/26/2017  Pharyngeal Phase Impaired  Pharyngeal- Pudding Teaspoon --  Pharyngeal --  Pharyngeal- Pudding Cup --  Pharyngeal --  Pharyngeal- Honey Teaspoon --  Pharyngeal --  Pharyngeal- Honey Cup --  Pharyngeal --  Pharyngeal- Nectar Teaspoon --  Pharyngeal --  Pharyngeal- Nectar Cup --  Pharyngeal --  Pharyngeal- Nectar Straw --  Pharyngeal --  Pharyngeal- Thin Teaspoon Pharyngeal residue - valleculae;Pharyngeal residue - pyriform;Penetration/Aspiration during swallow  Pharyngeal Material enters airway, CONTACTS cords and not ejected out;Material enters airway, passes BELOW cords without attempt by patient  to eject out (silent aspiration)  Pharyngeal- Thin Cup --  Pharyngeal --  Pharyngeal- Thin Straw --  Pharyngeal --  Pharyngeal- Puree --  Pharyngeal --  Pharyngeal- Mechanical Soft --  Pharyngeal --  Pharyngeal- Regular --  Pharyngeal --  Pharyngeal- Multi-consistency --  Pharyngeal --  Pharyngeal- Pill --  Pharyngeal --  Pharyngeal Comment --     Darrow BussingLisa Willis Katera Rybka M.Ed [employer]-SLP Pager [phone]

## 2017-09-26 NOTE — Discharge Summary (Signed)
Physician Discharge Summary  Patient ID: CABLE FEARN MRN: [id number] DOB/AGE: 07/09/1951 66 y.o.  Admit date: 08/23/2017 Discharge date: 09/26/2017  Admission Diagnoses: Patient Active Problem List   Diagnosis Date Noted  . Dysphagia   . Acute encephalopathy   . HCAP (healthcare-associated pneumonia)   . Acute respiratory failure with hypoxemia ([place])   . Acute pulmonary edema (HCC)   . CHF (congestive heart failure) ([place])   . Heart failure ([place]) 09/08/2017  . Respiratory failure ([place])   . STEMI (ST elevation myocardial infarction) ([place]) 08/23/2017  . Cardiogenic shock ([place])   . Diabetic polyneuropathy associated with diabetes mellitus due to underlying condition ([place]) 03/28/2016  . PAD (peripheral artery disease) ([place]) 01/23/2016  . Extremity atherosclerosis with gangrene ([place]) 01/01/2016  . Atherosclerosis of native arteries of the extremities with gangrene ([place]) 12/29/2015  . Diabetes ([place]) 10/30/2012  . CAD (coronary artery disease) 10/30/2012    Discharge Diagnoses:  Principal Problem:   STEMI (ST elevation myocardial infarction) ([place]) Active Problems:   Diabetes ([place])   Hyperlipemia   CAD (coronary artery disease)   HTN (hypertension)   Tobacco use   Hx of CABG   PAD (peripheral artery disease) (HCC)   Cardiogenic shock (HCC)   Respiratory failure (HCC)   Pressure injury of skin   Heart failure (HCC)   CHF (congestive heart failure) (HCC)   Acute pulmonary edema (HCC)   HCAP (healthcare-associated pneumonia)   Acute respiratory failure with hypoxemia (HCC)   Acute encephalopathy   Dysphagia   Discharged Condition: good  HPI:  66 year old obese Caucasian diabetic smoker with history of CAD status post PCI to the RCA 20 years ago admitted to the ED with chest pain, shortness of breath, nausea and diaphoresis, and hypotension. The patient had altered mental status with blood pressure 60 systolic. The patient had ST segment changes on EKG. He is treated  with saline bolus and epinephrine which improved his blood pressure. He had persistent chest pain and underwent emergency catheterization. This demonstrated chronic occlusion of the dominant RCA with faint collateralization from the left side. The LAD had a proximal 90% stenosis and the circumflex had a tubular 75% to 80% stenosis. Because of the patient's elevated creatinine 2.8 a venogram was not performed. LVEDP was 15 mmHg. The patient was in a sinus rhythm. A balloon pump was placed in the cath lab via the right femoral artery which improved his systolic blood pressure.  Echocardiogram shows LVH with mild LV dysfunction. There is moderate to severe RV dysfunction and RV dilatation with mild TR. No significant MR  The patient has been on Plavix since his PCI 20 years ago. He was last seen by cardiologist Dr. Ellyn Hack in 2017 prior to undergoing balloon angioplasty of a left saphenous vein femoropopliteal bypass with a stenosis at the distal anastomosis.  The patient is currently in the ICU with balloon pump was adequate systolic blood pressure. He has a slow junctional rhythm 50 bpm. He denies chest pain. Chest x-ray shows mild interstitial edema.  The patient's cardiologist feels that the best treatment would be surgical coronary revascularization. The RCA has a very questionable distal target vessel. The LAD diagonal and OM branches appear to be graftable vessels. The patient does not have saphenous vein in his left leg because of the previous femoral-popliteal bypass-she will need vein mapping of his right leg to document presence of adequate conduit prior to CABG.  The patient has a mild metabolic acidosis which is improving. His cardiac enzymes  are still rising. His creatinine is minimally improved today. A P2 Y 12 platelet function assay is pending.  Hospital Course:  After several days of medical therapy, Mr. Haig Gerardo underwent a CABG x 4 and placement of a centrimag ventricular  device on 08/28/2017. He was then transferred to the CVICU. His sternum was not closed due to RV dysfunction. Postop day 1, The patient remained responsive but sedated on low-dose milrinone and norepinephrine. Heparin was initiated according to C-mag sliding scale based on ACT and VAD flow rate. We continued his balloon pump due to being on IV heparin. Postop day 2 pulmonology/CCM was consulted. We initiated a Lasix drip for fluid overload. We kept the patient sedated and on the ventilator. Initiated broad-spectrum antibiotic coverage. We held a Lasix drip temporarily due to hypotension. We continue to titrate levophed and added vasopressin. We weaned milrinone. We continued to watch renal function closely. We initiated amiodarone due to atrial fibrillation. Postop day 4 he remained sedated on the ventilator. Echocardiogram today showed still moderate degree of dysfunction. The patient remained on tube feedings for nutrition. We did transfuse 2 unit of packed red blood cells for expected blood loss anemia. Postop day 5 we continued to wean pressors as tolerated. The balloon pump was removed. He remained on LVAD support for right ventricular infarction. Postop day 7 we reduced the heparin limit due to a second major incident of bleeding. He remained hemodynamically stable. He was responsive during awake assessment and receiving adequate nutrition via tube feeds. We continued to try and wean the right ventricular assist device. Cardiology was following along, and we consulted heart failure for additional assistance. On 9/29 the patient underwent a single biphasic synchronized 150 J shock due to atrial flutter with worsening hypotension. The shock prompted conversion to normal sinus rhythm. On 10/1 the patient underwent removal of his right ventricular assistive device, placement of a PA Swan catheter, closure of the sternum, and placement of a central line with Dr. Tharon Aquas Trigt. The patient was placed on inhaled  nitric oxide and remained intubated. We initiated a Lasix drip for fluid overload. He continued to wean vasopressin as tolerated. On 10/3 the patient had positive blood cultures for MRSA. All lines were changed in the OR. We discontinued his old PICC line he had been on vancomycin and Zosyn for antibiotic coverage. We continued to wean the ventilator but he was not ready for extubation. Due to neurologic concerns, a CT of the head was obtained which was negative. The patient remained in normal sinus rhythm. We continued to wean the ventilator with hopes for extubation. We continued aggressive diuretic regimen for fluid overload. It appears that the patient self extubated on 10/8. He was unable to clear secretions and was tracheal suctioned by Dr. Prescott Gum. He continued to have increasing oxygen demands and was at high risk for reintubation. He was reintubated on 10/9 for airway protection. On 10/10 he underwent a percutaneous tracheostomy with Dr. Titus Mould. We continued Coumadin and were able to stop IV heparin at this time due to therapeutic INR of 2.0. He spiked a fever of 102 after the tracheostomy was placed. The patient was recultured at this time. He remained on vancomycin, Diflucan, Fortaz until more specific cultures came back. He continued tube feedings through cortrack without residual. He was placed on IV iron for postoperative blood loss anemia. We continued IV and amiodarone for rhythm control. He continued to make slow and steady progress. He continued to have fevers, however  his white blood cell count remained normal to slightly elevated. He was C. difficile negative. On 10/15 he remained stable on the trach/vent. We discontinued Tressie Ellis and amiodarone due to  possible drug fever. All of his lines were changed at this time. On 10/16 we consulted LTAC services for placement. His temperature curve continued to improve with broadening of antibiotic coverage. Status post tracheostomy but weaning has been  prolonged due to pre-existing severe COPD. Physical therapy recommends CIR evaluation when the patient can be liberated from the ventilator. He continued to maintain normal sinus rhythm and continued tube feedings. On 10/18 we able to discontinue epicardial pacing wires. He may require a PEG due to persistent ventilator dependence. We continued to pursue LTAC, however had been deemed not a candidate. Dr. Prescott Gum did obtain LTAC approval on 10/18. We continued critical care medical therapy and attempts at tracheostomy weaning. The patient is off pressor medication at this time. Possible discharge to an LTAC today.  Consults: pulmonary/intensive care, Cardiology, Heart Failure  Significant Diagnostic Studies:  CLINICAL DATA:  Left-sided weakness and gaze  EXAM: CT HEAD WITHOUT CONTRAST  TECHNIQUE: Contiguous axial images were obtained from the base of the skull through the vertex without intravenous contrast.  COMPARISON:  None.  FINDINGS: Brain: No acute territorial infarction, hemorrhage or intracranial mass is visualized. Mild small vessel ischemic changes of the white matter. Moderate atrophy. Ventricle size within normal limits given atrophy  Vascular: No hyperdense vessels.  Carotid artery calcification  Skull: Fluid within the bilateral mastoid air cells and middle ears. No fracture  Sinuses/Orbits: Postsurgical changes of the maxillary and ethmoid sinuses. Scattered mucosal opacification within the ethmoid, sphenoid, and maxillary sinuses. No acute orbital abnormality.  Other: None  IMPRESSION: 1. No definite CT evidence for acute intracranial abnormality. MRI follow-up as indicated. 2. Atrophy and small vessel ischemic changes of the white matter 3. Fluid within the bilateral mastoid air cells and middle years.   Electronically Signed   By: Donavan Foil M.D.   On: 09/12/2017 17:51  CLINICAL DATA:  Un resolving pneumonia.  EXAM: CT CHEST, ABDOMEN  AND PELVIS WITHOUT CONTRAST  TECHNIQUE: Multidetector CT imaging of the chest, abdomen and pelvis was performed following the standard protocol without IV contrast.  COMPARISON:  Chest radiograph of same day. CT scan of July 22, 2007.  FINDINGS: CT CHEST FINDINGS  Cardiovascular: Atherosclerosis of thoracic aorta is noted without aneurysm formation. Status post coronary artery bypass graft. Minimal pericardial effusion is noted.  Mediastinum/Nodes: Tracheostomy tube is in grossly good position. Feeding tube is seen passing through esophagus. Thyroid gland is unremarkable. No mediastinal adenopathy is noted.  Lungs/Pleura: Mild left pleural effusion is noted with adjacent subsegmental atelectasis. Multifocal airspace opacities are noted in the right lower lobe most consistent with pneumonia.  Musculoskeletal: No chest wall mass or suspicious bone lesions identified.  CT ABDOMEN PELVIS FINDINGS  Hepatobiliary: No focal liver abnormality is seen. No gallstones, gallbladder wall thickening, or biliary dilatation.  Pancreas: Unremarkable. No pancreatic ductal dilatation or surrounding inflammatory changes.  Spleen: Normal in size without focal abnormality.  Adrenals/Urinary Tract: Adrenal glands and kidneys appear normal. No renal or ureteral calculi are noted. Vascular calcifications are noted in both kidneys. No hydronephrosis or renal obstruction is noted. Superior portion of urinary bladder is seen extending into moderate-sized left inguinal hernia.  Stomach/Bowel: Rectal tube is present. Distal tip of feeding tube is seen in fourth portion of duodenum. There is no evidence of bowel obstruction or inflammation. The  appendix appears normal.  Vascular/Lymphatic: Aortic atherosclerosis. No enlarged abdominal or pelvic lymph nodes.  Reproductive: Prostate is unremarkable.  Other: Moderate size left inguinal hernia is noted which contains a portion of  the superior portion of urinary bladder.  Musculoskeletal: No acute or significant osseous findings.  IMPRESSION: Multifocal airspace opacities are noted in the right lower lobe concerning for multifocal pneumonia.  Mild left pleural effusion is noted with adjacent subsegmental atelectasis.  Tracheostomy and feeding tubes are in grossly good position.  Minimal pericardial effusion.  Aortic atherosclerosis.  Moderate size left inguinal hernia is noted which contains a portion of the urinary bladder.   Electronically Signed   By: Marijo Conception, M.D.   On: 09/22/2017 13:48  Treatments:   08/28/2017 NAME:  ELDREDGE, VELDHUIZEN                  ACCOUNT NO.:  MEDICAL RECORD NO.:  [id number]  LOCATION:                                 FACILITY:  PHYSICIAN:  Ivin Poot, M.D.       DATE OF BIRTH:  DATE OF PROCEDURE:  08/28/2017 DATE OF DISCHARGE:                              OPERATIVE REPORT   OPERATIONS: 1. Coronary artery bypass grafting x4 (left internal mammary artery to     LAD, saphenous vein graft to diagonal, saphenous vein graft to     circumflex marginal, saphenous vein graft to right coronary artery     - RV marginal). 2. Endoscopic harvest of right leg greater saphenous vein. 3. Placement of percutaneous right ventricular assist device using a     CentriMag pump.  PREOPERATIVE DIAGNOSES:  ST-elevation myocardial infarction, right ventricular infarct, cardiogenic shock, 3-vessel coronary artery disease.  POSTOPERATIVE DIAGNOSES:  ST-elevation myocardial infarction, right ventricular infarct with cardiogenic shock, severe 3-vessel coronary artery disease, mild-moderate tricuspid regurgitation, right ventricular dysfunction following coronary artery bypass grafting requiring placement of right ventricular assist device.  SURGEON:  Ivin Poot, MD.  ASSISTANT:  Nicholes Rough, PA-C.  ANESTHESIA:  General by Dr. Rodman Comp.   Discharge Exam: Blood pressure 112/69, pulse 89, temperature 98.5 F (36.9 C), temperature source Oral, resp. rate (!) 24, height _0  (1.803 m), weight 202 lb (91.6 kg), SpO2 100 %.     General- alert and comfortable   Lungs- clear without rales, wheezes   Cor- regular rate and rhythm, no murmur , gallop   Abdomen- soft, non-tender   Extremities - warm, non-tender, minimal edema   Neuro- oriented, appropriate, no focal weakness  Disposition: 01-Home or Self Care  Discharge Instructions    AMB Referral to Cardiac Rehabilitation - Phase II    Complete by:  As directed    Diagnosis:  STEMI   Discharge patient    Complete by:  As directed    LTAC   Discharge disposition:  [place] Not Defined   Discharge patient date:  09/26/2017     Allergies as of 09/26/2017      Reactions   Aspirin Other (See Comments)   Stomach upset   Latex Rash   Oxycodone-acetaminophen Itching      Medication List    STOP taking these medications   amLODipine 5 MG tablet Commonly known  as:  NORVASC   carvedilol 3.125 MG tablet Commonly known as:  COREG   clopidogrel 75 MG tablet Commonly known as:  PLAVIX   gabapentin 300 MG capsule Commonly known as:  NEURONTIN   glimepiride 4 MG tablet Commonly known as:  AMARYL   lisinopril 40 MG tablet Commonly known as:  PRINIVIL,ZESTRIL   metFORMIN 1000 MG tablet Commonly known as:  GLUCOPHAGE   simvastatin 5 MG tablet Commonly known as:  ZOCOR   traMADol 50 MG tablet Commonly known as:  ULTRAM   traZODone 100 MG tablet Commonly known as:  DESYREL   trimethoprim-polymyxin b ophthalmic solution Commonly known as:  POLYTRIM     TAKE these medications   acetaminophen 650 MG CR tablet Commonly known as:  TYLENOL Take 1,300 mg by mouth 2 (two) times daily.   anidulafungin 100 mg in sodium chloride 0.9 % 100 mL Inject 100 mg into the vein daily.   atorvastatin 80 MG tablet Commonly known  as:  LIPITOR Place 1 tablet (80 mg total) into feeding tube daily at 6 PM.   brimonidine 0.15 % ophthalmic solution Commonly known as:  ALPHAGAN Place 1 drop into the right eye 2 (two) times daily.   budesonide 0.25 MG/2ML nebulizer solution Commonly known as:  PULMICORT Take 2 mLs (0.25 mg total) by nebulization 2 (two) times daily.   chlorhexidine gluconate (MEDLINE KIT) 0.12 % solution Commonly known as:  PERIDEX 15 mLs by Mouth Rinse route 2 (two) times daily.   Chlorhexidine Gluconate Cloth 2 % Pads Apply 6 each topically daily.   collagenase ointment Commonly known as:  SANTYL Apply topically daily.   feeding supplement (PRO-STAT SUGAR FREE 64) Liqd Place 30 mLs into feeding tube 3 (three) times daily.   feeding supplement (VITAL 1.5 CAL) Liqd Place 1,000 mLs into feeding tube continuous.   fentaNYL 100 MCG/2ML injection Commonly known as:  SUBLIMAZE Inject 1 mL (50 mcg total) into the vein every 2 (two) hours as needed for moderate pain or severe pain.   furosemide 10 MG/ML injection Commonly known as:  LASIX Inject 4 mLs (40 mg total) into the vein 2 (two) times daily.   insulin aspart 100 UNIT/ML injection Commonly known as:  novoLOG Inject 0-24 Units into the skin every 4 (four) hours.   insulin glargine 100 UNIT/ML injection Commonly known as:  LANTUS Inject 0.3 mLs (30 Units total) into the skin 2 (two) times daily.   ipratropium 0.02 % nebulizer solution Commonly known as:  ATROVENT Take 2.5 mLs (0.5 mg total) by nebulization every 6 (six) hours.   lactobacillus Pack Take 1 packet (1 g total) by mouth 3 (three) times daily with meals.   levalbuterol 0.63 MG/3ML nebulizer solution Commonly known as:  XOPENEX Take 3 mLs (0.63 mg total) by nebulization every 6 (six) hours.   meropenem 1 g in sodium chloride 0.9 % 100 mL Inject 1 g into the vein every 8 (eight) hours.   metoprolol tartrate 5 MG/5ML Soln injection Commonly known as:   LOPRESSOR Inject 2.5-5 mLs (2.5-5 mg total) into the vein every 2 (two) hours as needed (HR > 130 OR may use prn for SBP > 150 if HR > 70).   midazolam 2 MG/2ML Soln injection Commonly known as:  VERSED Inject 2 mLs (2 mg total) into the vein every 2 (two) hours as needed for agitation (to maintain RASS goal.).   mouth rinse Liqd solution 15 mLs by Mouth Rinse route QID.   mupirocin cream  2 % Commonly known as:  BACTROBAN Apply topically 2 (two) times daily.   ondansetron 4 MG/2ML Soln injection Commonly known as:  ZOFRAN Inject 2 mLs (4 mg total) into the vein every 6 (six) hours as needed for nausea or vomiting.   oxyCODONE 5 MG immediate release tablet Commonly known as:  Oxy IR/ROXICODONE Take 1 tablet (5 mg total) by mouth every 3 (three) hours as needed for severe pain.   pantoprazole sodium 40 mg/20 mL Pack Commonly known as:  PROTONIX Place 20 mLs (40 mg total) into feeding tube daily.   potassium chloride 10 MEQ/50ML Inject 50 mLs (10 mEq total) into the vein every hour as needed (for K+ < 3.8).   sodium chloride 0.9 % infusion Inject 10 mLs into the vein continuous.   sodium chloride flush 0.9 % Soln Commonly known as:  NS 10-40 mLs by Intracatheter route every 12 (twelve) hours.   sodium chloride flush 0.9 % Soln Commonly known as:  NS 10-40 mLs by Intracatheter route as needed (flush).   sodium chloride flush 0.9 % Soln Commonly known as:  NS Inject 3 mLs into the vein every 12 (twelve) hours.   sodium chloride flush 0.9 % Soln Commonly known as:  NS Inject 3 mLs into the vein as needed.   spironolactone 25 MG tablet Commonly known as:  ALDACTONE Place 1 tablet (25 mg total) into feeding tube daily.   vancomycin 500 mg in sodium chloride 0.9 % 100 mL Inject 500 mg into the vein every 12 (twelve) hours.   warfarin 3 MG tablet Commonly known as:  COUMADIN Take 1 tablet (3 mg total) by mouth one time only at 6 PM.   white petrolatum Oint Commonly  known as:  VASELINE Apply 1 application topically 2 (two) times daily.   Zinc Oxide 12.8 % ointment Commonly known as:  TRIPLE PASTE Apply topically as needed for irritation.       Follow-up Information    Ivin Poot, MD Follow up.   Specialty:  Cardiothoracic Surgery Why:  10/22/2017 at 2:30 Contact information: [address] [place] [address] [phone]          The patient has been discharged on:   1.Beta Blocker:  Yes [   ]                              No   [ x  ]                              If No, reason:hypotension  2.Ace Inhibitor/ARB: Yes [   ]                                     No  [  x  ]                                     If No, reason:hypotension  3.Statin:   Yes [ x  ]                  No  [   ]  If No, reason:  4.Ecasa:  Yes  [   ]                  No   [ x  ]                  If No, reason:   Signed: Elgie Collard 09/26/2017, 12:57 PM  patient examined and medical record reviewed,agree with above note. Tharon Aquas Trigt III 09/26/2017

## 2017-09-26 NOTE — Progress Notes (Signed)
PCCM Attending Re-Rounding Note:  Patient seen again this afternoon. Comfortable. Hasn't attempted PS weaning yet. Planned [facility].  Donna ChristenJennings E. Jamison NeighborNestor, M.D. [facility] Pager:  [phone] After 7pm or if no response, call [phone] 5:02 PM 09/26/17

## 2017-09-26 NOTE — Progress Notes (Signed)
ANTICOAGULATION CONSULT NOTE  Pharmacy Consult for heparin/Coumadin Indication: afib, s/p DCCV 9/29  Allergies  Allergen Reactions  . Aspirin Other (See Comments)    Stomach upset  . Latex Rash  . Oxycodone-Acetaminophen Itching    Patient Measurements: Height: 5\' 11"  (180.3 cm) Weight: 202 lb (91.6 kg) IBW/kg (Calculated) : 75.3  Vital Signs: Temp: 99.3 F (37.4 C) (10/19 0344) Temp Source: Oral (10/19 0344) BP: 127/65 (10/19 0600) Pulse Rate: 89 (10/19 0600)  Labs:  Recent Labs  09/24/17 0343  09/25/17 0413 09/25/17 1630 09/26/17 0341  HGB 7.8*  --  7.8*  --  7.3*  HCT 25.8*  --  26.1*  --  24.0*  PLT 181  --  180  --  175  LABPROT 22.1*  --  23.7*  --  24.8*  INR 1.95  --  2.13  --  2.26  CREATININE 1.11  < > 1.07 1.08 1.06  < > = values in this interval not displayed.  Estimated Creatinine Clearance: 82.5 mL/min (by C-G formula based on SCr of 1.06 mg/dL).   Assessment: [age] yoM with post/op AFib s/p DCCV continues on warfarin per pharmacy. INR continues to trend up slowly to 2.26 this morning, CBC low but stable, dopplers negative for DVT 10/16.  Goal of Therapy:  INR 2-3 Monitor platelets by anticoagulation protocol: Yes   Plan: -Coumadin 3 mg PO x1 tonight -Daily  INR  Fredonia HighlandMichael Jerell Demery, PharmD PGY-2 Cardiology Pharmacy Resident Pager: [phone] 09/26/2017

## 2017-09-26 NOTE — Progress Notes (Signed)
PULMONARY / CRITICAL CARE MEDICINE   Name: Clinton Harrington MRN: [id number] DOB: September 24, 1951    ADMISSION DATE:  08/23/2017   CONSULTATION DATE:  08/30/2017  REFERRING MD:  Tressie Stalker, M.D. / CVTS  CHIEF COMPLAINT:  Acute Hypoxic Respiratory Failure   HISTORY OF PRESENT ILLNESS:  66 y.o. male with known coronary artery disease who suffered an MI on 9/15. Patient notably had severe RV failure. Underwent treatment with coronary artery bypass graft, intra-aortic balloon pump and eventually placement of a centrimag device. Postoperatively patient was weaned off right ventricular assist device on 10/1. Patient remained on mechanical ventilation and treatment for left lower lobe healthcare associated pneumonia. He completed a course of antibiotics. He underwent tracheostomy placement on 10/10 but suffered a fever on 10/11 and was subsequently recultured.  SUBJECTIVE: Not weaning well. Will need LTAC and [facility] is evaluating. PCCm will concentrate pulmonary issues.   REVIEW OF SYSTEMS:  Unable to obtain given ventilator requirement and tracheostomy.  VITAL SIGNS: BP 117/64   Pulse 91   Temp 99.3 F (37.4 C) (Oral)   Resp (!) 27   Ht 5\' 11"  (1.803 m)   Wt 202 lb (91.6 kg)   SpO2 100%   BMI 28.17 kg/m   HEMODYNAMICS: CVP:  [9 mmHg-15 mmHg] 9 mmHg  VENTILATOR SETTINGS: Vent Mode: PRVC FiO2 (%):  [30 %] 30 % Set Rate:  [16 bmp] 16 bmp Vt Set:  [600 mL] 600 mL PEEP:  [8 cmH20] 8 cmH20 Pressure Support:  [18 cmH20] 18 cmH20 Plateau Pressure:  [18 cmH20-22 cmH20] 22 cmH20  INTAKE / OUTPUT: I/O last 3 completed shifts: In: 6130 [I.V.:1790; NG/GT:3610; IV Piggyback:730] Out: 3565 [Urine:3015; Emesis/NG output:150; Stool:400]  PHYSICAL EXAMINATION: General: Ill appearing male, not weaning well from vent. HEENT: Trach CDI PSY: Nl affect Neuro: Follows commands CV: HSD PULM: Decreased bs bases WU:JWJX, non-tender, bsx4 active  Extremities: warm/dry, ++edema  Skin:sacral decubitus  with dressing   LABS:  BMET  Recent Labs Lab 09/25/17 0413 09/25/17 1630 09/26/17 0341  NA 151* 146* 140  K 4.1 4.4 4.0  CL 123* 116* 113*  CO2 23 23 22   BUN 50* 50* 47*  CREATININE 1.07 1.08 1.06  GLUCOSE 152* 217* 324*    Electrolytes  Recent Labs Lab 09/25/17 0413 09/25/17 1630 09/26/17 0341  CALCIUM 7.2* 7.1* 7.0*  MG 3.0*  --  2.7*  PHOS 3.0  --  3.1    CBC  Recent Labs Lab 09/24/17 0343 09/25/17 0413 09/26/17 0341  WBC 7.2 6.5 6.8  HGB 7.8* 7.8* 7.3*  HCT 25.8* 26.1* 24.0*  PLT 181 180 175    Coag's  Recent Labs Lab 09/24/17 0343 09/25/17 0413 09/26/17 0341  INR 1.95 2.13 2.26    Sepsis Markers  Recent Labs Lab 09/21/17 0847 09/22/17 0350 09/23/17 0351  PROCALCITON 0.23 0.22 0.28    ABG  Recent Labs Lab 09/20/17 2220  PHART 7.410  PCO2ART 36.8  PO2ART 174.0*    Liver Enzymes  Recent Labs Lab 09/22/17 0350 09/26/17 0341  AST 141*  --   ALT 80*  --   ALKPHOS 73  --   BILITOT 0.7  --   ALBUMIN 2.1* 1.7*    Cardiac Enzymes No results for input(s): TROPONINI, PROBNP in the last 168 hours.  Glucose  Recent Labs Lab 09/25/17 0757 09/25/17 1152 09/25/17 1533 09/25/17 1942 09/25/17 2330 09/26/17 0343  GLUCAP 163* 139* 163* 200* 222* 198*    Imaging Dg Chest Port 1 [address]  Result Date: 09/26/2017 CLINICAL DATA:  Trach placement EXAM: PORTABLE CHEST 1 VIEW COMPARISON:  09/24/2017 FINDINGS: Tracheostomy, right PICC line and feeding tube remain in place. Prior CABG. Cardiomegaly with vascular congestion. Small layering left effusion with left lower lobe atelectasis. No confluent opacity on the right. IMPRESSION: Continued cardiomegaly with vascular congestion. Small layering left effusion with left lower lobe atelectasis. No real change. Electronically Signed   By: Charlett Nose M.D.   On: 09/26/2017 07:21     STUDIES:  LHC 08/23/17:  Prox RCA to Mid RCA lesion, 100 %stenosed.  Ost LAD lesion, 90  %stenosed.  Mid LAD lesion, 75 %stenosed.  1st Diag lesion, 75 %stenosed. PFT 08/25/17: FVC 1.34 L (32%) FEV1 1.04 L (33%) FEV1/FVC 0.77 FEF 25-75 0.83 L (33%) negative bronchodilator response CT HEAD W/O 10/5: IMPRESSION: 1. No definite CT evidence for acute intracranial abnormality. MRI follow-up as indicated. 2. Atrophy and small vessel ischemic changes of the white matter 3. Fluid within the bilateral mastoid air cells and middle ears. TTE (09/17/17):  EF 60-65% with moderate concentric hypertrophy but normal cavity size. No regional wall motion abnormalities but cannot completely exclude based on study. Grade 1 diastolic dysfunction. LA & RA normal in size. RV normal in size with moderately reduced systolic function. Mild aortic stenosis with no regurgitation. Aortic root normal in size. No mitral stenosis or regurgitation. No pulmonic regurgitation with poorly visualized valve. No tricuspid regurgitation. No pericardial effusion. CT CHEST/ABD/PELVIS W/O 10/15: IMPRESSION: 1. Multifocal airspace opacities are noted in the right lower lobe concerning for multifocal pneumonia. 2. Mild left pleural effusion is noted with adjacent subsegmental atelectasis. 3. Tracheostomy and feeding tubes are in grossly good position. 4. Minimal pericardial effusion. 5. Aortic atherosclerosis. 6. Moderate size left inguinal hernia is noted which contains a portion of the urinary bladder. BILATERAL LOWER EXTREMITY VENOUS DUPLEX 10/16:  Negative for DVT or SVT.  PORT CXR 10/17:  Personally reviewed by me. Hazy left basilar opacity suspicious for pleural effusion. Minimal atelectasis at the left base. Lordotic view. Somewhat low lung volumes. Tracheostomy in place. Right upper extremity PICC line in place. Enteric feeding tube coursing below diaphragm. No new focal opacity appreciated.  MICROBIOLOGY: MRSA PCR 9/15:  Negative HIV 9/15:  Nonreactive  MRSA PCR 9/16:  Negative Tracheal Aspirate Culture 9/20:   Normal Flora Tracheal Aspirate Culture 9/22:  Normal Flora  Urine Culture 9/24:  Negative  Tracheal Aspirate Culture 9/24:  Normal Flora  Blood Cultures x2 9/24:  Negative  Blood Cultures x2 9/28:  1/2 Positive for MRSE Sternal Chest Wound Culture 10/1:  Negative  Tracheal Aspirate Culture 10/2:  Moderate Candida albicans  Bronchoscopy w/ Wash 10/2:  Normal Flora  Blood Cultures x2 10/3:  Negative  Tracheal Aspirate Culture 10/7:  Few Candida albicans  Tracheal Aspirate Culture 10/11:  Few MRSE Blood Cultures x2 10/11:  Negative  Urine Culture 10/11:  Negative  Tracheal Aspirate Culture 10/13:  Normal Flora Stool C diff 10/13:  Negative  Blood Cultures x2 10/13 >>>neg  ANTIBIOTICS: Vancomycin 9/20 - 10/5; 10/11 - 10/17  10/18>> Cefuroxime 9/20 - 9/22 Fortaz 9/22 - 9/28; 10/11 - 10/15 Zosyn 9/28 - 10/11 Diflucan 10/3 - 10/8; 10/11 - 10/13 Eraxis 10/15 - 10/17. 10/18>> Merrem 10/15 >>>  SIGNIFICANT EVENTS: 09/15 - Admitted w/ acute MI >> LHC & IABP placed 09/20 - Intubated & underwent CABG & RVAD placement 09/23 - Remained on RVAD w/ Levophed & Epinephrine drips 09/24 - Patient on Levophed, Epinephrine, Primacor, Amiodarone, &  Lasix drips 09/26 - Diuresing on lasix drip at 15mg /hr. Still on RVAD 09/27 - Weaned to FiO2 0.4 & PEEP 8. Levophed, Primacor, Epinephrine, & Amiodarone drips continuing. Heparin drip held with bleeding at groin site>>transfused 2u PRBC. 09/29 - Cardioversion for A fib. Transfused 4u PRBC. 09/30 - Mucus plugging relieved with multiple bag-mask lavages 10/01 - RVAD removed in OR secondary to bleeding. Transfused 2u PRBC. 10/02 - Remains on Levophed, Vasopressin, Epinephrine, & Primacor 10/08 - Self-extubated 10/09 - Reintubated 10/10 - Tracheostomy placed  10/19 no real progress on weaning  LINES/TUBES: OETT 09/22 - 10/8 (self-extubated); 10/9 - 10/10 IABP 9/15 - 9/24 R Weldon CVL 10/1 - 10/12? CHEST TUBES - out S/P Trach #6 10/10 >>> RUE DL PICC 40/98  >>> L NGT >>> FOLEY >>>  ASSESSMENT / PLAN:  PULMONARY A: Acute hypoxic respiratory failure: Multifactorial from healthcare associated pneumonia as well as pulmonary edema. Healthcare associated pneumonia: Left basilar infiltrate. Left pleural effusion: Small. No plan for chest tube for thoracentesis. Status post tracheostomy placement: Sutures are removed 10/16.  P:   Continuous pulse oximetry monitoring Gentle diuresis as renal function and hypernatremia allow Continuing pressure support wean as tolerated Advancing to tracheostomy collar as tolerated, will need LTAC Continuing Xopenex nebs every 6 hours Continuing Pulmicort 0.25 mg nebulized twice a day Adding Atrovent 0.5 mg nebulized every 6 hours to regimen Upright position as much as possible to improve diaphragmatic excursion Suffers from weakness from prolonged hospitalization.   CARDIOVASCULAR A:  Acute myocardial infarction: Status post CABG. Cardiogenic shock: Resolved. Cor pulmonale: Evident on echocardiogram. Right ventricular assist device removed. Paroxysmal atrial fibrillation  P:  Postoperative care per thoracic surgery Systemic anticoagulation with Coumadin/heparin drip per pharmacy protocol Continuous telemetry monitoring Monitoring vitals per unit protocol Continuing Lipitor 80 mg daily at bedtime Continuing Aldactone 25 mg via tube daily Per cards and cvts   RENAL  Recent Labs Lab 09/25/17 0413 09/25/17 1630 09/26/17 0341  NA 151* 146* 140    Recent Labs Lab 09/25/17 0413 09/25/17 1630 09/26/17 0341  K 4.1 4.4 4.0    Lab Results  Component Value Date   CREATININE 1.06 09/26/2017   CREATININE 1.08 09/25/2017   CREATININE 1.07 09/25/2017    A:   Hypernatremia: Acutely worse today but possible the 140 yesterday was an error. Hypokalemia: Resolved. Metabolic acidosis: Resolved. Acute renal failure: Resolving.  P:   Monitor urine output with Foley Lasix per cards Continuing  free water 400 mL via tube every 6 hour Dc D5W 10/19 per cards  GASTROINTESTINAL A:   Diarrhea: C. difficile negative. Left inguinal hernia: Noted on CT of the abdomen also containing a portion of the bladder.  P:   Monitoring stool output.  HEMATOLOGIC Lab Results  Component Value Date   INR 2.26 09/26/2017   INR 2.13 09/25/2017   INR 1.95 09/24/2017    Recent Labs  09/25/17 0413 09/26/17 0341  HGB 7.8* 7.3*    A:   Anemia: No signs of active bleeding. Status post prior transfusion on 9/27. Coagulopathy: Secondary to Coumadin.  P:  Trending INR daily Trending cell counts daily with CBC Transfusion goals as per primary service  INFECTIOUS A:   Sepsis: Possible sources include healthcare associated pneumonia vs buttock pressure ulcer. MRSE pneumonia  P:   Awaiting finalization of cultures Currently on day #5/7 of meropenem Eraxis and vanc per CVTS Plan to reculture for fever  ENDOCRINE CBG (last 3)   Recent Labs  09/25/17 1942 09/25/17 2330  09/26/17 0343  GLUCAP 200* 222* 198*     A:   Diabetes mellitus: Glucose labile on dextrose infusion. Dextrose dc'd 10/19  P:   Continuing Accu-Cheks every 4 hours with sliding scale insulin per protocol Continuing Lantus 30 units subcutaneous twice a day but will need to monitor with stopping of d5w 10/19  NEUROLOGIC A:   Postoperative pain Left-sided weakness:  Resolved. May need MRI brain.  Lower ext weakness 10/19 which is intermittent.  P:   Oxycodone & fentanyl ordered as needed  Prophylaxis:  Protonix via tube daily.  Diet:  NPO. Continuing tube feedings.  Code Status:  Full Code as per previous physician discussions.  Disposition:  As per primary service. PT consulted. Family Update: No family at bedside at this time .  DISCUSSION:  66 y.o. male with myocardial infarction and cor pulmonale post CABG. Ongoing respiratory failure with poor improvement on pressure support weaning. Continuing on  treatment for methicillin-resistant Staphylococcus epidermidis pneumonia. Abx per CVTS. Hypernatremia and volume overload addressed per Heart failure team. Does not tolearte weaning from vent. [facility]   App CCT 35 minutes  Brett CanalesSteve Minor ACNP Adolph PollackLe Bauer PCCM Pager [phone] till 3 pm If no answer page [phone] 09/26/2017, 9:03 AM

## 2017-09-26 NOTE — Progress Notes (Signed)
OT Cancellation Note  Patient Details Name: Clinton HammockReginald E Harrington MRN: [id number] DOB: 08/23/1951   Cancelled Treatment:    Reason Eval/Treat Not Completed: OT screened, no needs identified, will sign off. [facility].  Will defer OT eval to [facility].  Laycie Schriner Pangburnonarpe, OTR/L [phone]   Jeani HawkingConarpe, Taelon Bendorf M 09/26/2017, 1:01 PM

## 2017-09-27 ENCOUNTER — Other Ambulatory Visit (HOSPITAL_COMMUNITY): Payer: PPO

## 2017-09-27 DIAGNOSIS — I25810 Atherosclerosis of coronary artery bypass graft(s) without angina pectoris: Secondary | ICD-10-CM | POA: Diagnosis not present

## 2017-09-27 LAB — BLOOD GAS, ARTERIAL
ACID-BASE DEFICIT: 0 mmol/L (ref 0.0–2.0)
BICARBONATE: 23 mmol/L (ref 20.0–28.0)
Drawn by: 511471
FIO2: 30
LHR: 16 {breaths}/min
O2 SAT: 97.7 %
PATIENT TEMPERATURE: 98.6
PCO2 ART: 30.4 mmHg — AB (ref 32.0–48.0)
PEEP/CPAP: 8 cmH2O
PH ART: 7.491 — AB (ref 7.350–7.450)
VT: 600 mL
pO2, Arterial: 90.9 mmHg (ref 83.0–108.0)

## 2017-09-27 LAB — VANCOMYCIN, TROUGH: Vancomycin Tr: 11 ug/mL — ABNORMAL LOW (ref 15–20)

## 2017-09-27 LAB — CBC
HCT: 25.7 % — ABNORMAL LOW (ref 39.0–52.0)
Hemoglobin: 7.8 g/dL — ABNORMAL LOW (ref 13.0–17.0)
MCH: 29.7 pg (ref 26.0–34.0)
MCHC: 30.4 g/dL (ref 30.0–36.0)
MCV: 97.7 fL (ref 78.0–100.0)
PLATELETS: 205 10*3/uL (ref 150–400)
RBC: 2.63 MIL/uL — AB (ref 4.22–5.81)
RDW: 16.8 % — ABNORMAL HIGH (ref 11.5–15.5)
WBC: 6.8 10*3/uL (ref 4.0–10.5)

## 2017-09-27 LAB — COMPREHENSIVE METABOLIC PANEL
ALK PHOS: 92 U/L (ref 38–126)
ALT: 92 U/L — AB (ref 17–63)
ANION GAP: 8 (ref 5–15)
AST: 104 U/L — ABNORMAL HIGH (ref 15–41)
Albumin: 1.9 g/dL — ABNORMAL LOW (ref 3.5–5.0)
BILIRUBIN TOTAL: 0.5 mg/dL (ref 0.3–1.2)
BUN: 42 mg/dL — ABNORMAL HIGH (ref 6–20)
CALCIUM: 7.5 mg/dL — AB (ref 8.9–10.3)
CO2: 23 mmol/L (ref 22–32)
CREATININE: 0.97 mg/dL (ref 0.61–1.24)
Chloride: 115 mmol/L — ABNORMAL HIGH (ref 101–111)
Glucose, Bld: 94 mg/dL (ref 65–99)
Potassium: 4.3 mmol/L (ref 3.5–5.1)
SODIUM: 146 mmol/L — AB (ref 135–145)
TOTAL PROTEIN: 6.6 g/dL (ref 6.5–8.1)

## 2017-09-27 LAB — PROTIME-INR
INR: 2.16
Prothrombin Time: 23.9 seconds — ABNORMAL HIGH (ref 11.4–15.2)

## 2017-09-28 LAB — COMPREHENSIVE METABOLIC PANEL
ALT: 74 U/L — ABNORMAL HIGH (ref 17–63)
ANION GAP: 2 — AB (ref 5–15)
AST: 70 U/L — ABNORMAL HIGH (ref 15–41)
Albumin: 1.7 g/dL — ABNORMAL LOW (ref 3.5–5.0)
Alkaline Phosphatase: 103 U/L (ref 38–126)
BUN: 36 mg/dL — ABNORMAL HIGH (ref 6–20)
CHLORIDE: 118 mmol/L — AB (ref 101–111)
CO2: 21 mmol/L — AB (ref 22–32)
Calcium: 7.6 mg/dL — ABNORMAL LOW (ref 8.9–10.3)
Creatinine, Ser: 0.95 mg/dL (ref 0.61–1.24)
Glucose, Bld: 167 mg/dL — ABNORMAL HIGH (ref 65–99)
POTASSIUM: 4.6 mmol/L (ref 3.5–5.1)
SODIUM: 141 mmol/L (ref 135–145)
Total Bilirubin: 0.5 mg/dL (ref 0.3–1.2)
Total Protein: 6.6 g/dL (ref 6.5–8.1)

## 2017-09-28 LAB — VANCOMYCIN, TROUGH: VANCOMYCIN TR: 15 ug/mL (ref 15–20)

## 2017-09-28 LAB — CBC
HEMATOCRIT: 26.7 % — AB (ref 39.0–52.0)
HEMOGLOBIN: 8 g/dL — AB (ref 13.0–17.0)
MCH: 28.9 pg (ref 26.0–34.0)
MCHC: 30 g/dL (ref 30.0–36.0)
MCV: 96.4 fL (ref 78.0–100.0)
PLATELETS: 235 10*3/uL (ref 150–400)
RBC: 2.77 MIL/uL — AB (ref 4.22–5.81)
RDW: 16.7 % — ABNORMAL HIGH (ref 11.5–15.5)
WBC: 6 10*3/uL (ref 4.0–10.5)

## 2017-09-28 LAB — HEMOGLOBIN AND HEMATOCRIT, BLOOD
HEMATOCRIT: 28.4 % — AB (ref 39.0–52.0)
Hemoglobin: 8.6 g/dL — ABNORMAL LOW (ref 13.0–17.0)

## 2017-09-28 LAB — PROTIME-INR
INR: 1.98
Prothrombin Time: 22.3 seconds — ABNORMAL HIGH (ref 11.4–15.2)

## 2017-09-28 NOTE — Progress Notes (Signed)
IR aware of request for G-tube. Imaging reviewed, potential percutaneous window. Chart reviewed, pt on Coumadin, last INR 1.98 Anticoagulation will need to be held and INR will have to be at or below 1.5 in order to proceed. IR will follow progress.   Brayton ElKevin Tommye Lehenbauer PA-C Interventional Radiology 09/28/2017 1:52 PM

## 2017-09-29 LAB — COMPREHENSIVE METABOLIC PANEL
ALBUMIN: 1.9 g/dL — AB (ref 3.5–5.0)
ALT: 67 U/L — AB (ref 17–63)
AST: 58 U/L — AB (ref 15–41)
Alkaline Phosphatase: 95 U/L (ref 38–126)
Anion gap: 4 — ABNORMAL LOW (ref 5–15)
BUN: 36 mg/dL — AB (ref 6–20)
CHLORIDE: 116 mmol/L — AB (ref 101–111)
CO2: 21 mmol/L — ABNORMAL LOW (ref 22–32)
CREATININE: 1.08 mg/dL (ref 0.61–1.24)
Calcium: 7.8 mg/dL — ABNORMAL LOW (ref 8.9–10.3)
GFR calc Af Amer: >60 mL/min (ref >60)
GLUCOSE: 69 mg/dL (ref 65–99)
POTASSIUM: 4.7 mmol/L (ref 3.5–5.1)
Sodium: 141 mmol/L (ref 135–145)
Total Bilirubin: 0.7 mg/dL (ref 0.3–1.2)
Total Protein: 6.7 g/dL (ref 6.5–8.1)

## 2017-09-29 LAB — CBC
HEMATOCRIT: 27.9 % — AB (ref 39.0–52.0)
Hemoglobin: 8.5 g/dL — ABNORMAL LOW (ref 13.0–17.0)
MCH: 29.2 pg (ref 26.0–34.0)
MCHC: 30.5 g/dL (ref 30.0–36.0)
MCV: 95.9 fL (ref 78.0–100.0)
PLATELETS: 264 10*3/uL (ref 150–400)
RBC: 2.91 MIL/uL — ABNORMAL LOW (ref 4.22–5.81)
RDW: 16.3 % — AB (ref 11.5–15.5)
WBC: 6.3 10*3/uL (ref 4.0–10.5)

## 2017-09-29 LAB — PROTIME-INR
INR: 1.85
Prothrombin Time: 21.2 seconds — ABNORMAL HIGH (ref 11.4–15.2)

## 2017-09-29 LAB — GLUCOSE, CAPILLARY: Glucose-Capillary: 156 mg/dL — ABNORMAL HIGH (ref 65–99)

## 2017-09-29 NOTE — Consult Note (Signed)
Chief Complaint: Patient was seen in consultation today for percutaneous gastric tube placement at the request of Dr Mariah Milling   Supervising Physician: Aletta Edouard  Patient Status: [facility]  History of Present Illness: MAXDEN NAJI is a 66 y.o. male   CAD SOB and hypotension Heart cath and CABG 09/2017 Afib Developed +BC 10/3---all lines changed in OR Re intubated 10/9 and difficult to wean 09/17/17 trach Vent dependent Dysphagia Need for long term care Request for percutaneous gastric tube placement  Imaging has been approved with Radiologist Scheduled now for 10/24 tentatively Coumadin held---INR 1.85 today  Past Medical History:  Diagnosis Date  . Arthritis   . Diabetes ([place])   . Diabetes mellitus Jan. 1999  . High cholesterol   . Hypertension   . Myocardial infarct ([place])   . Peripheral vascular disease ([place])   . Seasonal allergies   . Smoker   . Urine incontinence     Past Surgical History:  Procedure Laterality Date  . CORONARY ARTERY BYPASS GRAFT N/A 08/28/2017   Procedure: CORONARY ARTERY BYPASS GRAFTING (CABG) times four using left internal mammary artery and right greater saphenous leg vein using endoscope.;  Surgeon: Ivin Poot, MD;  Location: [place];  Service: Open Heart Surgery;  Laterality: N/A;  . CORONARY STENT PLACEMENT  Jan.  1999  . FEMORAL-POPLITEAL BYPASS GRAFT Left 01/03/2016   Procedure: LEFT COMMON FEMORAL-BELOW KNEE POPLITEAL ARTERY BYPASS GRAFT USING NON REVERSED TRANSLOCATED SAPHENOUS VEIN GRAFT;  Surgeon: Mal Misty, MD;  Location: [place];  Service: Vascular;  Laterality: Left;  . IABP INSERTION N/A 08/23/2017   Procedure: IABP Insertion;  Surgeon: Lorretta Harp, MD;  Location: [place] [facility];  Service: Cardiovascular;  Laterality: N/A;  . INTRAOPERATIVE ARTERIOGRAM Left 01/03/2016   Procedure: INTRA OPERATIVE ARTERIOGRAM LEFT LEG;  Surgeon: Mal Misty, MD;  Location: [place];  Service: Vascular;   Laterality: Left;  . LEFT HEART CATH AND CORS/GRAFTS ANGIOGRAPHY N/A 08/23/2017   Procedure: LEFT HEART CATH AND CORS/GRAFTS ANGIOGRAPHY;  Surgeon: Lorretta Harp, MD;  Location: [place] [facility];  Service: Cardiovascular;  Laterality: N/A;  . PERIPHERAL VASCULAR CATHETERIZATION N/A 01/01/2016   Procedure: Abdominal Aortogram w/Lower Extremity;  Surgeon: Conrad Wrightsboro, MD;  Location: [place] [facility];  Service: Cardiovascular;  Laterality: N/A;  . PERIPHERAL VASCULAR CATHETERIZATION Left 01/01/2016   Procedure: Peripheral Vascular Balloon Angioplasty;  Surgeon: Conrad [place]ville, MD;  Location: [place] [facility];  Service: Cardiovascular;  Laterality: Left;  Popliteal artery  . PERIPHERAL VASCULAR CATHETERIZATION N/A 12/04/2016   Procedure: Abdominal Aortogram w/Lower Extremity;  Surgeon: Waynetta Sandy, MD;  Location: [place] [facility];  Service: Cardiovascular;  Laterality: N/A;  . PERIPHERAL VASCULAR CATHETERIZATION Left 12/04/2016   Procedure: Peripheral Vascular Balloon Angioplasty;  Surgeon: Waynetta Sandy, MD;  Location: [place] [facility];  Service: Cardiovascular;  Laterality: Left;  Fem_Pop bypass  . PLACEMENT OF CENTRIMAG VENTRICULAR ASSIST DEVICE Right 08/28/2017   Procedure: PLACEMENT OF CENTRIMAG VENTRICULAR ASSIST DEVICE;  Surgeon: Ivin Poot, MD;  Location: [place];  Service: Open Heart Surgery;  Laterality: Right;  . REMOVAL OF CENTRIMAG VENTRICULAR ASSIST DEVICE N/A 09/08/2017   Procedure: REMOVAL OF R-VENTRICULAR ASSIST DEVICE WITH CARDIOPULMONARY BYPASS;  Surgeon: Ivin Poot, MD;  Location: [place];  Service: Open Heart Surgery;  Laterality: N/A;  . TEE WITHOUT CARDIOVERSION N/A 08/28/2017   Procedure: TRANSESOPHAGEAL ECHOCARDIOGRAM (TEE);  Surgeon: Prescott Gum, Collier Salina, MD;  Location: [place];  Service: Open Heart Surgery;  Laterality: N/A;  . TEE WITHOUT CARDIOVERSION N/A 09/08/2017   Procedure: TRANSESOPHAGEAL ECHOCARDIOGRAM (TEE);  Surgeon: Prescott Gum,  Collier Salina, MD;  Location: [place];  Service: Open Heart Surgery;  Laterality: N/A;    Allergies: Aspirin; Latex; and Oxycodone-acetaminophen  Medications: Prior to Admission medications   Medication Sig Start Date End Date Taking? Authorizing Provider  acetaminophen (TYLENOL) 650 MG CR tablet Take 1,300 mg by mouth 2 (two) times daily.    [provider]  Amino Acids-Protein Hydrolys (FEEDING SUPPLEMENT, PRO-STAT SUGAR FREE 64,) LIQD Place 30 mLs into feeding tube 3 (three) times daily. 09/26/17   Elgie Collard, PA-C  anidulafungin 100 mg in sodium chloride 0.9 % 100 mL Inject 100 mg into the vein daily. 09/27/17   Elgie Collard, PA-C  atorvastatin (LIPITOR) 80 MG tablet Place 1 tablet (80 mg total) into feeding tube daily at 6 PM. 09/26/17   Elgie Collard, PA-C  brimonidine (ALPHAGAN) 0.15 % ophthalmic solution Place 1 drop into the right eye 2 (two) times daily.  07/21/17   [provider]  budesonide (PULMICORT) 0.25 MG/2ML nebulizer solution Take 2 mLs (0.25 mg total) by nebulization 2 (two) times daily. 09/26/17   Elgie Collard, PA-C  Chlorhexidine Gluconate Cloth 2 % PADS Apply 6 each topically daily. 09/27/17   Elgie Collard, PA-C  chlorhexidine gluconate, MEDLINE KIT, (PERIDEX) 0.12 % solution 15 mLs by Mouth Rinse route 2 (two) times daily. 09/26/17   Elgie Collard, PA-C  collagenase (SANTYL) ointment Apply topically daily. 09/27/17   Elgie Collard, PA-C  fentaNYL (SUBLIMAZE) 100 MCG/2ML injection Inject 1 mL (50 mcg total) into the vein every 2 (two) hours as needed for moderate pain or severe pain. 09/26/17   Elgie Collard, PA-C  furosemide (LASIX) 10 MG/ML injection Inject 4 mLs (40 mg total) into the vein 2 (two) times daily. 09/26/17   Elgie Collard, PA-C  insulin aspart (NOVOLOG) 100 UNIT/ML injection Inject 0-24 Units into the skin every 4 (four) hours. 09/26/17   Elgie Collard, PA-C  insulin glargine (LANTUS) 100 UNIT/ML injection Inject 0.3 mLs (30 Units  total) into the skin 2 (two) times daily. 09/26/17   Elgie Collard, PA-C  ipratropium (ATROVENT) 0.02 % nebulizer solution Take 2.5 mLs (0.5 mg total) by nebulization every 6 (six) hours. 09/26/17   Elgie Collard, PA-C  lactobacillus (FLORANEX/LACTINEX) PACK Take 1 packet (1 g total) by mouth 3 (three) times daily with meals. 09/26/17   Elgie Collard, PA-C  levalbuterol (XOPENEX) 0.63 MG/3ML nebulizer solution Take 3 mLs (0.63 mg total) by nebulization every 6 (six) hours. 09/26/17   Elgie Collard, PA-C  meropenem 1 g in sodium chloride 0.9 % 100 mL Inject 1 g into the vein every 8 (eight) hours. 09/26/17 10/10/17  Elgie Collard, PA-C  metoprolol tartrate (LOPRESSOR) 5 MG/5ML SOLN injection Inject 2.5-5 mLs (2.5-5 mg total) into the vein every 2 (two) hours as needed (HR > 130 OR may use prn for SBP > 150 if HR > 70). 09/26/17   Elgie Collard, PA-C  midazolam (VERSED) 2 MG/2ML SOLN injection Inject 2 mLs (2 mg total) into the vein every 2 (two) hours as needed for agitation (to maintain RASS goal.). 09/26/17   Elgie Collard, PA-C  mouth rinse LIQD solution 15 mLs by Mouth Rinse route QID. 09/26/17   Elgie Collard, PA-C  mupirocin cream (BACTROBAN) 2 % Apply topically 2 (  two) times daily. 09/26/17   Elgie Collard, PA-C  Nutritional Supplements (FEEDING SUPPLEMENT, VITAL 1.5 CAL,) LIQD Place 1,000 mLs into feeding tube continuous. 09/26/17   Elgie Collard, PA-C  ondansetron (ZOFRAN) 4 MG/2ML SOLN injection Inject 2 mLs (4 mg total) into the vein every 6 (six) hours as needed for nausea or vomiting. 09/26/17   Elgie Collard, PA-C  oxyCODONE (OXY IR/ROXICODONE) 5 MG immediate release tablet Take 1 tablet (5 mg total) by mouth every 3 (three) hours as needed for severe pain. 09/26/17   Elgie Collard, PA-C  pantoprazole sodium (PROTONIX) 40 mg/20 mL PACK Place 20 mLs (40 mg total) into feeding tube daily. 09/27/17   Elgie Collard, PA-C  potassium chloride 10 MEQ/50ML Inject 50 mLs (10 mEq total)  into the vein every hour as needed (for K+ < 3.8). 09/26/17   Elgie Collard, PA-C  sodium chloride 0.9 % infusion Inject 10 mLs into the vein continuous. 09/26/17   Elgie Collard, PA-C  sodium chloride flush (NS) 0.9 % SOLN 10-40 mLs by Intracatheter route every 12 (twelve) hours. 09/26/17   Elgie Collard, PA-C  sodium chloride flush (NS) 0.9 % SOLN 10-40 mLs by Intracatheter route as needed (flush). 09/26/17   Elgie Collard, PA-C  sodium chloride flush (NS) 0.9 % SOLN Inject 3 mLs into the vein every 12 (twelve) hours. 09/26/17   Elgie Collard, PA-C  sodium chloride flush (NS) 0.9 % SOLN Inject 3 mLs into the vein as needed. 09/26/17   Elgie Collard, PA-C  spironolactone (ALDACTONE) 25 MG tablet Place 1 tablet (25 mg total) into feeding tube daily. 09/27/17   Elgie Collard, PA-C  vancomycin 500 mg in sodium chloride 0.9 % 100 mL Inject 500 mg into the vein every 12 (twelve) hours. 09/26/17   Elgie Collard, PA-C  warfarin (COUMADIN) 3 MG tablet Take 1 tablet (3 mg total) by mouth one time only at 6 PM. 09/26/17   Elgie Collard, PA-C  white petrolatum (VASELINE) OINT Apply 1 application topically 2 (two) times daily. 09/26/17   Elgie Collard, PA-C  Zinc Oxide (TRIPLE PASTE) 12.8 % ointment Apply topically as needed for irritation. 09/26/17   Elgie Collard, PA-C     Family History  Problem Relation Age of Onset  . Arthritis Mother   . Hypertension Mother   . Heart disease Maternal Grandmother   . Mental illness Maternal Grandmother     Social History   Social History  . Marital status: Divorced    Spouse name: N/A  . Number of children: N/A  . Years of education: N/A   Social History Main Topics  . Smoking status: Current Every Day Smoker    Packs/day: 0.50    Years: 45.00    Types: Cigarettes  . Smokeless tobacco: Never Used     Comment: 1/2 pk per day  . Alcohol use No  . Drug use: No  . Sexual activity: Not on file   Other Topics Concern  . Not on file   Social  History Narrative   Disability since 2015.  Previously worked at [employer] for 10 years.    He lives at home with mother with dementia.   He has one daughter.    Review of Systems: A 12 point ROS discussed and pertinent positives are indicated in the HPI above.  All other systems are negative.  Review of Systems  Constitutional: Positive for activity change,  appetite change and fatigue. Negative for fever.  Respiratory: Positive for shortness of breath.   Gastrointestinal: Negative for abdominal pain.  Neurological: Positive for weakness.  Psychiatric/Behavioral: Negative for confusion.    Vital Signs: There were no vitals taken for this visit.  Physical Exam  Constitutional: He is oriented to person, place, and time.  Cardiovascular: Normal rate and regular rhythm.   Pulmonary/Chest:  Vent/trach  Abdominal: Soft.  Musculoskeletal: Normal range of motion.  Neurological: He is alert and oriented to person, place, and time.  Skin: Skin is warm and dry.  Psychiatric: He has a normal mood and affect. His behavior is normal. Judgment and thought content normal.  Consented pt but did call Dtr Dessie Coma is aware of procedure and agreeable  Nursing note and vitals reviewed.   Imaging: Ct Abdomen Pelvis Wo Contrast  Result Date: 09/22/2017 CLINICAL DATA:  Un resolving pneumonia. EXAM: CT CHEST, ABDOMEN AND PELVIS WITHOUT CONTRAST TECHNIQUE: Multidetector CT imaging of the chest, abdomen and pelvis was performed following the standard protocol without IV contrast. COMPARISON:  Chest radiograph of same day. CT scan of July 22, 2007. FINDINGS: CT CHEST FINDINGS Cardiovascular: Atherosclerosis of thoracic aorta is noted without aneurysm formation. Status post coronary artery bypass graft. Minimal pericardial effusion is noted. Mediastinum/Nodes: Tracheostomy tube is in grossly good position. Feeding tube is seen passing through esophagus. Thyroid gland is unremarkable. No  mediastinal adenopathy is noted. Lungs/Pleura: Mild left pleural effusion is noted with adjacent subsegmental atelectasis. Multifocal airspace opacities are noted in the right lower lobe most consistent with pneumonia. Musculoskeletal: No chest wall mass or suspicious bone lesions identified. CT ABDOMEN PELVIS FINDINGS Hepatobiliary: No focal liver abnormality is seen. No gallstones, gallbladder wall thickening, or biliary dilatation. Pancreas: Unremarkable. No pancreatic ductal dilatation or surrounding inflammatory changes. Spleen: Normal in size without focal abnormality. Adrenals/Urinary Tract: Adrenal glands and kidneys appear normal. No renal or ureteral calculi are noted. Vascular calcifications are noted in both kidneys. No hydronephrosis or renal obstruction is noted. Superior portion of urinary bladder is seen extending into moderate-sized left inguinal hernia. Stomach/Bowel: Rectal tube is present. Distal tip of feeding tube is seen in fourth portion of duodenum. There is no evidence of bowel obstruction or inflammation. The appendix appears normal. Vascular/Lymphatic: Aortic atherosclerosis. No enlarged abdominal or pelvic lymph nodes. Reproductive: Prostate is unremarkable. Other: Moderate size left inguinal hernia is noted which contains a portion of the superior portion of urinary bladder. Musculoskeletal: No acute or significant osseous findings. IMPRESSION: Multifocal airspace opacities are noted in the right lower lobe concerning for multifocal pneumonia. Mild left pleural effusion is noted with adjacent subsegmental atelectasis. Tracheostomy and feeding tubes are in grossly good position. Minimal pericardial effusion. Aortic atherosclerosis. Moderate size left inguinal hernia is noted which contains a portion of the urinary bladder. Electronically Signed   By: Marijo Conception, M.D.   On: 09/22/2017 13:48   Ct Head Wo Contrast  Result Date: 09/12/2017 CLINICAL DATA:  Left-sided weakness and  gaze EXAM: CT HEAD WITHOUT CONTRAST TECHNIQUE: Contiguous axial images were obtained from the base of the skull through the vertex without intravenous contrast. COMPARISON:  None. FINDINGS: Brain: No acute territorial infarction, hemorrhage or intracranial mass is visualized. Mild small vessel ischemic changes of the white matter. Moderate atrophy. Ventricle size within normal limits given atrophy Vascular: No hyperdense vessels.  Carotid artery calcification Skull: Fluid within the bilateral mastoid air cells and middle ears. No fracture Sinuses/Orbits: Postsurgical changes of the maxillary and ethmoid sinuses.  Scattered mucosal opacification within the ethmoid, sphenoid, and maxillary sinuses. No acute orbital abnormality. Other: None IMPRESSION: 1. No definite CT evidence for acute intracranial abnormality. MRI follow-up as indicated. 2. Atrophy and small vessel ischemic changes of the white matter 3. Fluid within the bilateral mastoid air cells and middle years. Electronically Signed   By: Donavan Foil M.D.   On: 09/12/2017 17:51   Ct Chest Without Contrast  Result Date: 09/22/2017 CLINICAL DATA:  Un resolving pneumonia. EXAM: CT CHEST, ABDOMEN AND PELVIS WITHOUT CONTRAST TECHNIQUE: Multidetector CT imaging of the chest, abdomen and pelvis was performed following the standard protocol without IV contrast. COMPARISON:  Chest radiograph of same day. CT scan of July 22, 2007. FINDINGS: CT CHEST FINDINGS Cardiovascular: Atherosclerosis of thoracic aorta is noted without aneurysm formation. Status post coronary artery bypass graft. Minimal pericardial effusion is noted. Mediastinum/Nodes: Tracheostomy tube is in grossly good position. Feeding tube is seen passing through esophagus. Thyroid gland is unremarkable. No mediastinal adenopathy is noted. Lungs/Pleura: Mild left pleural effusion is noted with adjacent subsegmental atelectasis. Multifocal airspace opacities are noted in the right lower lobe most  consistent with pneumonia. Musculoskeletal: No chest wall mass or suspicious bone lesions identified. CT ABDOMEN PELVIS FINDINGS Hepatobiliary: No focal liver abnormality is seen. No gallstones, gallbladder wall thickening, or biliary dilatation. Pancreas: Unremarkable. No pancreatic ductal dilatation or surrounding inflammatory changes. Spleen: Normal in size without focal abnormality. Adrenals/Urinary Tract: Adrenal glands and kidneys appear normal. No renal or ureteral calculi are noted. Vascular calcifications are noted in both kidneys. No hydronephrosis or renal obstruction is noted. Superior portion of urinary bladder is seen extending into moderate-sized left inguinal hernia. Stomach/Bowel: Rectal tube is present. Distal tip of feeding tube is seen in fourth portion of duodenum. There is no evidence of bowel obstruction or inflammation. The appendix appears normal. Vascular/Lymphatic: Aortic atherosclerosis. No enlarged abdominal or pelvic lymph nodes. Reproductive: Prostate is unremarkable. Other: Moderate size left inguinal hernia is noted which contains a portion of the superior portion of urinary bladder. Musculoskeletal: No acute or significant osseous findings. IMPRESSION: Multifocal airspace opacities are noted in the right lower lobe concerning for multifocal pneumonia. Mild left pleural effusion is noted with adjacent subsegmental atelectasis. Tracheostomy and feeding tubes are in grossly good position. Minimal pericardial effusion. Aortic atherosclerosis. Moderate size left inguinal hernia is noted which contains a portion of the urinary bladder. Electronically Signed   By: Marijo Conception, M.D.   On: 09/22/2017 13:48   Dg Chest Port 1 View  Result Date: 09/27/2017 CLINICAL DATA:  CABG follow-up. EXAM: PORTABLE CHEST 1 VIEW COMPARISON:  09/26/2017 and prior radiographs FINDINGS: Cardiomegaly and CABG changes again noted. A tracheostomy tube, small bore feeding tube and right PICC line with tip  overlying the superior cavoatrial junction again noted. Left basilar atelectasis/effusion unchanged. There is no evidence of pneumothorax. Little significant change since prior study. IMPRESSION: Little significant change since the prior study with support apparatus, cardiomegaly/CABG changes and left lower lung atelectasis. Electronically Signed   By: Margarette Canada M.D.   On: 09/27/2017 07:34   Dg Chest Port 1 View  Result Date: 09/26/2017 CLINICAL DATA:  Trach placement EXAM: PORTABLE CHEST 1 VIEW COMPARISON:  09/24/2017 FINDINGS: Tracheostomy, right PICC line and feeding tube remain in place. Prior CABG. Cardiomegaly with vascular congestion. Small layering left effusion with left lower lobe atelectasis. No confluent opacity on the right. IMPRESSION: Continued cardiomegaly with vascular congestion. Small layering left effusion with left lower lobe atelectasis. No real  change. Electronically Signed   By: Rolm Baptise M.D.   On: 09/26/2017 07:21   Dg Chest Port 1 View  Result Date: 09/24/2017 CLINICAL DATA:  Tracheostomy EXAM: PORTABLE CHEST 1 VIEW COMPARISON:  09/23/2017 FINDINGS: Prior CABG. Cardiomegaly. Vascular congestion. Left infrahilar atelectasis or infiltrate. No visible effusions. No acute bony abnormality. Support devices are stable. IMPRESSION: Cardiomegaly with vascular congestion. Left lower lobe atelectasis or infiltrate. Electronically Signed   By: Rolm Baptise M.D.   On: 09/24/2017 08:05   Dg Chest Port 1 View  Result Date: 09/23/2017 CLINICAL DATA:  66 year old male post tracheostomy in CABG. Subsequent encounter. EXAM: PORTABLE CHEST 1 VIEW COMPARISON:  Chest x-ray and chest CT 09/22/2017. FINDINGS: Tracheostomy tube tip midline. Feeding tube courses below the diaphragm. Tip is not included on the present exam. Right PICC line tip caval atrial junction/ proximal right atrium. To be within the distal superior vena cava, this can be retracted by 1.5 cm. Post CABG.  Cardiomegaly.  Pulmonary vascular prominence most notable centrally. Left base atelectasis.  Small loculated left-sided pleural effusion. Right lower lobe multifocal nodules better delineated on recent CT and may represent result of pneumonia/ aspiration and can be assessed on follow-up. IMPRESSION: Right PICC line tip caval atrial junction/ proximal right atrium. To be within the distal superior vena cava, this can be retracted by 1.5 cm. Post CABG.  Cardiomegaly. Pulmonary vascular prominence most notable centrally. Left base atelectasis.  Small loculated left-sided pleural effusion. Right lower lobe multifocal nodules better delineated on recent CT and may represent result of pneumonia/ aspiration and can be assessed on follow-up. Electronically Signed   By: Genia Del M.D.   On: 09/23/2017 08:16   Dg Chest Port 1 View  Result Date: 09/22/2017 CLINICAL DATA:  Postop from CABG. EXAM: PORTABLE CHEST 1 VIEW COMPARISON:  09/21/2017 FINDINGS: Decreased atelectasis or infiltrate seen in the left retrocardiac lung base. Heart size is stable within normal limits. Right arm PICC line, tracheostomy tube, and feeding tube remain in place. IMPRESSION: Resolving left retrocardiac atelectasis or infiltrate. Electronically Signed   By: Earle Gell M.D.   On: 09/22/2017 07:24   Dg Chest Port 1 View  Result Date: 09/21/2017 CLINICAL DATA:  Status post CABG EXAM: PORTABLE CHEST 1 VIEW COMPARISON:  Yesterday FINDINGS: Cardiomegaly is stable. Noted changes of CABG. Right upper extremity PICC with tip at the upper right atrium. A feeding tube is present. Tracheostomy tube in place. Vascular congestion with small left effusion.  No pneumothorax. IMPRESSION: Stable vascular congestion and small left effusion. No specific findings to explain fever. Electronically Signed   By: Monte Fantasia M.D.   On: 09/21/2017 07:02   Dg Chest Port 1 View  Result Date: 09/20/2017 CLINICAL DATA:  Fever. EXAM: PORTABLE CHEST 1 VIEW COMPARISON:   Radiograph of September 19, 2017. FINDINGS: Stable cardiomegaly. Status post coronary artery bypass graft. Atherosclerosis of thoracic aorta is noted. Tracheostomy and feeding tubes are unchanged in position. Right-sided PICC line is unchanged in position. No pneumothorax is noted. Right lung is clear. Mild left basilar subsegmental atelectasis is noted with minimal pleural effusion. Bony thorax is unremarkable. IMPRESSION: Aortic atherosclerosis. Stable support apparatus. Mild left basilar subsegmental atelectasis is noted with minimal pleural effusion. Electronically Signed   By: Marijo Conception, M.D.   On: 09/20/2017 07:21   Dg Chest Port 1 View  Result Date: 09/19/2017 CLINICAL DATA:  Respiratory failure EXAM: PORTABLE CHEST 1 VIEW COMPARISON:  09/18/2017 FINDINGS: Tracheostomy tube, right central line and  feeding tube remain in place, unchanged. Prior CABG. Cardiomegaly. Improving left basilar atelectasis or infiltrate. No confluent opacity on the right. Small left pleural effusion. IMPRESSION: Improving left basilar aeration with decreasing atelectasis or infiltrate. Small left effusion. Electronically Signed   By: Rolm Baptise M.D.   On: 09/19/2017 07:22   Dg Chest Port 1 View  Result Date: 09/18/2017 CLINICAL DATA:  Tracheostomy.  CABG.  Respiratory failure. EXAM: PORTABLE CHEST 1 VIEW COMPARISON:  One day prior FINDINGS: Right-sided PICC line terminates at the mid SVC. Tracheostomy is appropriately positioned. Feeding tube extends beyond the inferior aspect of the film. Numerous leads and wires project over the chest. Prior median sternotomy. Midline trachea. Cardiomegaly accentuated by AP portable technique. Small left pleural effusion persists. No pneumothorax. Mild interstitial edema. Removal of right chest tube. Developing left base airspace disease. IMPRESSION: Removal of right chest tube, without pneumothorax. Mild congestive heart failure with similar small left pleural effusion. Developing  left base airspace disease. Electronically Signed   By: Abigail Miyamoto M.D.   On: 09/18/2017 07:45   Dg Chest Port 1 View  Result Date: 09/17/2017 CLINICAL DATA:  Endotracheal tube removal and tracheostomy appliance placement. EXAM: PORTABLE CHEST 1 VIEW COMPARISON:  Portable chest x-ray of earlier today. FINDINGS: The endotracheal tube is been removed. A tracheostomy tube is present whose tip projects at the inferior margin of the clavicular heads. The lungs are adequately inflated. The right chest tube is in stable position. There is no definite right-sided pneumothorax. On the left there is density that projects over the anterior aspects of the third and fourth ribs which is stable. There is no pneumothorax there is persistent left lower lobe atelectasis or pneumonia. There is a trace of pleural fluid on the left. The cardiac silhouette is enlarged. The central pulmonary vascularity is mildly prominent. There is calcification in the wall of the aortic arch. The sternal wires are intact. The right subclavian venous catheter tip projects over the midportion of the SVC. IMPRESSION: No complication following exchange of the endotracheal tube for an tracheostomy tube. Stable appearance of both lungs. Electronically Signed   By: [name]  Martinique M.D.   On: 09/17/2017 12:29   Dg Chest Port 1 View  Result Date: 09/17/2017 CLINICAL DATA:  Status post CABG. EXAM: PORTABLE CHEST 1 VIEW COMPARISON:  09/16/2017. FINDINGS: Stable ET tube, nasogastric tube, and RIGHT chest tube. LEFT chest tube has been removed. There is no pneumothorax. Mild vascular congestion without consolidation or frank edema. Cardiomegaly. IMPRESSION: LEFT chest tube has been removed. No pneumothorax. Otherwise stable appearance. Electronically Signed   By: Staci Righter M.D.   On: 09/17/2017 08:11   Dg Chest Port 1 View  Result Date: 09/16/2017 CLINICAL DATA:  Status post CABG on August 28, 2017 EXAM: PORTABLE CHEST 1 VIEW COMPARISON:  Chest  x-ray of September 16, 2017 at 1:26 a.m. FINDINGS: The lungs are adequately inflated. The interstitial markings remain increased on the left. There is a small left pleural effusion. There is no pneumothorax. The chest tubes are in stable position. The endotracheal tube tip lies approximately 1.8 cm above the carina. The esophagogastric tube tip lies in the gastric cardia with the proximal port at the level of the GE junction. The right subclavian venous catheter tip projects over the junction of the proximal and middle thirds of the SVC. IMPRESSION: Slight overall improvement in the appearance of the pulmonary interstitium greatest on the right. Persistent mild interstitial prominence on the left. Persistent small left pleural  effusion. No pneumothorax. Mild cardiomegaly with mild central pulmonary vascular prominence, decreased since the earlier study today. The support tubes are in reasonable position. Advancement of the esophagogastric tube by 5-10 cm would assure that the proximal port remains below the GE junction. Electronically Signed   By: [name]  Martinique M.D.   On: 09/16/2017 10:15   Dg Chest Port 1 View  Result Date: 09/16/2017 CLINICAL DATA:  Shortness of breath. Coronary artery bypass grafting EXAM: PORTABLE CHEST 1 VIEW COMPARISON:  Chest radiograph 09/15/2017 FINDINGS: Right subclavian central venous catheter is unchanged in position with tip in lower SVC. Bilateral chest tubes are in unchanged positions. Unchanged CABG sequelae. Cardiomediastinal contours are unchanged with bibasilar opacities, likely atelectasis. Small left pleural effusion, unchanged. IMPRESSION: Unchanged chest radiograph with stable support apparatus, mild interstitial edema, cardiomegaly and small left pleural effusion. Electronically Signed   By: Ulyses Jarred M.D.   On: 09/16/2017 02:17   Dg Chest Port 1 View  Result Date: 09/15/2017 CLINICAL DATA:  Status post self extubation.  Initial encounter. EXAM: PORTABLE CHEST 1 VIEW  COMPARISON:  Chest radiograph performed 09/14/2017 FINDINGS: The patient's endotracheal tube has been removed. Bilateral chest tubes are unchanged in position. A right subclavian line is noted ending about the mid SVC. A left subclavian sheath is noted ending about the left brachiocephalic vein. A mediastinal drain is again noted. A small left pleural effusion is again noted. Mild bilateral central airspace opacity likely reflects mild interstitial edema. No pneumothorax is seen. The cardiomediastinal silhouette is mildly enlarged. The patient is status post median sternotomy, with findings of prior CABG. No acute osseous abnormalities are seen. IMPRESSION: Small left pleural effusion again noted. Mild stable bilateral central airspace opacity likely reflects mild interstitial edema. Mild cardiomegaly. Electronically Signed   By: Garald Balding M.D.   On: 09/15/2017 04:43   Dg Chest Port 1 View  Result Date: 09/14/2017 CLINICAL DATA:  Followup CABG EXAM: PORTABLE CHEST 1 VIEW COMPARISON:  09/13/2017 FINDINGS: Endotracheal tube tip 3 cm above the carina. Nasogastric tube enters the abdomen. Right central line tip in the SVC above the right atrium. Swan-Ganz catheter is been removed. Left subclavian venous access sheath remains in place. Bilateral chest tubes. No pneumothorax. Persistent mild basilar atelectasis, left more than right. Mild edema. IMPRESSION: Swan-Ganz catheter removed. Other lines and tubes well positioned. No pneumothorax. Persistent basilar atelectasis left more than right and mild edema. Electronically Signed   By: Nelson Chimes M.D.   On: 09/14/2017 06:56   Dg Chest Port 1 View  Result Date: 09/13/2017 CLINICAL DATA:  Followup ventilator support and bilateral chest tubes. EXAM: PORTABLE CHEST 1 VIEW COMPARISON:  09/12/2017. FINDINGS: Endotracheal tube tip 5 cm above the carina. Nasogastric tube enters the abdomen. Central line tip in the SVC 3 cm above the right atrium. Bilateral chest  tubes in place. No pneumothorax. There is lower lobe atelectasis, left worse than right. Small amount of pleural fluid on the left. IMPRESSION: Lines and tubes well positioned. No pneumothorax. Persistent lower lobe atelectasis left worse than right. Electronically Signed   By: Nelson Chimes M.D.   On: 09/13/2017 07:29   Dg Chest Port 1 View  Result Date: 09/12/2017 CLINICAL DATA:  Status post coronary bypass grafting EXAM: PORTABLE CHEST 1 VIEW COMPARISON:  09/11/2017 FINDINGS: Cardiac shadow is mildly enlarged. Postsurgical changes are again seen. Bilateral thoracostomy catheters, mediastinal drain and right subclavian central line are seen. Swan-Ganz catheter is noted in the pulmonary outflow tract. Endotracheal tube and  nasogastric catheter are seen as well. Patchy infiltrative changes are noted on the left relatively stable from the prior exam. No pneumothorax is noted. IMPRESSION: Tubes and lines as described above. Patchy left lung changes slightly increased from the prior exam. Electronically Signed   By: Inez Catalina M.D.   On: 09/12/2017 07:10   Dg Chest Port 1 View  Result Date: 09/11/2017 CLINICAL DATA:  ETT, chest tube present status post CABG EXAM: PORTABLE CHEST 1 VIEW COMPARISON:  09/10/2017 FINDINGS: Endotracheal tube terminates 4.5 cm above the carina. Mild interstitial edema.  Suspected small left pleural effusion. Bilateral chest tubes and mediastinal drain. No pneumothorax is seen. Cardiomegaly.  Postsurgical changes related to prior CABG. Left subclavian Swan-Ganz catheter terminates in the main pulmonary artery. Right subclavian catheter terminates in the lower SVC. Enteric tube courses into the stomach. IMPRESSION: Postsurgical changes related to prior CABG. Bilateral chest tubes and mediastinal drain. No pneumothorax is seen. Mild interstitial edema.  Small left pleural effusion. Endotracheal tube terminates 4.5 cm above the carina. Additional support apparatus as above.  Electronically Signed   By: Julian Hy M.D.   On: 09/11/2017 07:36   Dg Chest Port 1 View  Result Date: 09/10/2017 CLINICAL DATA:  Status post CABG on August 28, 2017 with removal of left ventricular assist device on September 08, 2017. EXAM: PORTABLE CHEST 1 VIEW COMPARISON:  Portable chest x-ray of September 09, 2017 FINDINGS: The lungs are reasonably well inflated. There is persistent increased interstitial density in the left mid and lower lung but this is improving. There is improved visualization of the left hemidiaphragm. The cardiac silhouette remains enlarged. The pulmonary vascularity is less engorged and more distinct. Bilateral chest tubes and the mediastinal drain are in stable position. The Swan-Ganz catheter tip projects in the main pulmonary outflow tract. The endotracheal tube tip projects approximately 3 cm above the carina. The esophagogastric tube tip and feeding tube tip project below the inferior margin of the image. The right-sided PICC line tip projects over the midportion of the SVC. IMPRESSION: Improving pulmonary interstitial edema. Decreasing left basilar atelectasis or pneumonia. No significant pleural effusion and no pneumothorax. The support tubes are in reasonable position bilaterally. Electronically Signed   By: [name]  Martinique M.D.   On: 09/10/2017 07:43   Dg Chest Port 1 View  Result Date: 09/09/2017 CLINICAL DATA:  66 year old male post CABG.  Subsequent encounter. EXAM: PORTABLE CHEST 1 VIEW COMPARISON:  09/08/2017. FINDINGS: Endotracheal tube tip 6 cm above the carina. Left-sided Swan-Ganz catheter tip main pulmonary artery level. Bilateral chest tubes in place without gross pneumothorax. Post CABG.  Cardiomegaly.  Calcified tortuous aorta. Progressive consolidation left mid to lower lobe may represent atelectasis with pleural effusion although infiltrate not excluded. Component of underlying pulmonary vascular congestion. IMPRESSION: Progressive consolidation left mid  to lower lobe may represent atelectasis with pleural effusion although infiltrate not excluded. Component of underlying pulmonary vascular congestion. Cardiomegaly post CABG. Aortic Atherosclerosis (ICD10-I70.0).  Calcified tortuous aorta Right central line and mediastinal drain appear to have been removed. Electronically Signed   By: Genia Del M.D.   On: 09/09/2017 07:43   Dg Chest Port 1 View  Result Date: 09/08/2017 CLINICAL DATA:  Postop cardiac surgery. EXAM: PORTABLE CHEST 1 VIEW COMPARISON:  09/08/2017 at 0524 hours FINDINGS: Sequelae of CABG are again identified. Endotracheal tube terminates 4.7 cm above the carina. Enteric tube courses into the left upper abdomen with tip not imaged. Right jugular Swan-Ganz catheter has been removed. Right jugular sheath  remains. Right PICC terminates over the cavoatrial junction or high right atrium. A new left subclavian Swan-Ganz catheter terminates over the main pulmonary artery. A mediastinal drain and bilateral chest tubes remain in place. Cannulae for external ventricular assist device have been removed in the interim. The cardiac silhouette remains enlarged. Aortic atherosclerosis is noted. Patchy right upper lobe opacity is similar to the prior study. Patchy left basilar opacity is mildly improved. A small left pleural effusion is again noted. No definite residual pneumothorax is identified laterally in the left hemithorax as on today's earlier study, although there is a small amount of curvilinear lucency in the left lung base along the hemidiaphragm. IMPRESSION: 1. Support devices with interval changes as above. 2. Possible small loculated pneumothorax in the left lung base. 3. Mildly improved left lung base aeration. Unchanged right upper lobe atelectasis or infiltrate. Electronically Signed   By: Logan Bores M.D.   On: 09/08/2017 12:42   Dg Chest Port 1 View  Result Date: 09/08/2017 CLINICAL DATA:  Respiratory failure. EXAM: PORTABLE CHEST 1 VIEW  COMPARISON:  Radiograph of September 07, 2017. FINDINGS: Stable cardiomegaly. Endotracheal and nasogastric tubes are unchanged in position. Right-sided PICC line is unchanged. Right internal jugular Swan-Ganz catheter is noted with distal tip in expected position of main pulmonary artery. Bilateral chest tubes are noted. No pneumothorax is noted on the right. Apical pneumothorax on the prior exam is significantly improved, with only mild pneumothorax seen laterally in the left lung. Right upper lobe infiltrate is noted. Increased left basilar atelectasis and effusion is noted. Bony thorax is unremarkable. Aortic atherosclerosis. IMPRESSION: Stable support apparatus. Left pneumothorax noted on prior exam is significantly improved. Right upper lobe atelectasis or infiltrate is noted. Increase left basilar opacity is noted concerning for worsening atelectasis and effusion. Electronically Signed   By: Marijo Conception, M.D.   On: 09/08/2017 07:37   Dg Chest Port 1 View  Result Date: 09/07/2017 CLINICAL DATA:  Status post CABG. EXAM: PORTABLE CHEST 1 VIEW COMPARISON:  09/06/2017 and prior radiographs FINDINGS: A new left apical/medial pneumothorax is identified (25-30%). Cardiomegaly and CABG changes again noted. Endotracheal tube with tip 2.5 cm above the carina, Swan-Ganz catheter with tip overlying the main pulmonary artery, right PICC line with tip overlying the superior cavoatrial junction, NG tube entering the stomach, mediastinal tube and bilateral thoracostomy tubes are present. Defibrillator pads overlying the left chest are now noted. Increasing pulmonary vascular congestion/ possible mild interstitial edema noted. IMPRESSION: New left apical/medial pneumothorax - 25-30%. Increasing pulmonary vascular congestion/possible mild interstitial edema. No other significant change. Critical Value/emergent results were called by telephone at the time of interpretation on 09/07/2017 at 8:48 am to [facility], RN, who  verbally acknowledged these results. Electronically Signed   By: Margarette Canada M.D.   On: 09/07/2017 08:56   Dg Chest Port 1 View  Result Date: 09/06/2017 CLINICAL DATA:  Status post CABG. EXAM: PORTABLE CHEST 1 VIEW COMPARISON:  09/05/2017 FINDINGS: Sequelae of CABG are again identified. Endotracheal tube, right jugular Swan-Ganz catheter, mediastinal drain, and bilateral chest tubes are unchanged. A right PICC remains in place with tip partially obscured by overlying support devices but likely at the cavoatrial junction or in the high right atrium. Cannulae for external ventricular assist device remain in place. Left pleural effusion and patchy left basilar airspace opacity are unchanged. No pneumothorax is identified. IMPRESSION: 1. Support devices as above. 2. Unchanged left pleural effusion and left basilar opacity, likely atelectasis. Electronically Signed   By:  Logan Bores M.D.   On: 09/06/2017 08:20   Dg Chest Port 1 View  Result Date: 09/05/2017 CLINICAL DATA: History of coronary bypass graft EXAM: PORTABLE CHEST 1 VIEW COMPARISON:  09/04/2017 FINDINGS: Swan-Ganz catheter, endotracheal tube, nasogastric catheter and feeding catheter are again seen and stable. Bilateral chest tubes are seen and stable. Cannula identified bilaterally consistent with the external bypass machine. Right-sided PICC line is noted with the catheter tip at the cavoatrial junction. No pneumothorax is seen. Left pleural effusion is noted and stable in appearance. Left basilar atelectasis is noted stable from the prior exam. No new focal abnormality is noted. IMPRESSION: Tubes and lines as described above stable from the previous exam. Left basilar atelectasis and small effusion unchanged from the prior exam. Electronically Signed   By: Inez Catalina M.D.   On: 09/05/2017 08:01   Dg Chest Port 1 View  Result Date: 09/04/2017 CLINICAL DATA:  Hypoxia EXAM: PORTABLE CHEST 1 VIEW COMPARISON:  September 03, 2017 FINDINGS:  Endotracheal tube tip is 3.9 cm above the carina. Swan-Ganz catheter tip is in the main pulmonary outflow tract. There are chest tubes and mediastinal drain bilaterally. There is a cannula to the right and left of midline, stable. Nasogastric tube tip and side port are below the diaphragm. Temporary pacemaker wires are attached to the right heart. No pneumothorax. There is a small pleural effusion on the left with patchy atelectasis in the left lower lobe. Right lung is clear. There is cardiomegaly with pulmonary venous hypertension. There is aortic atherosclerosis. No bone lesions are evident. IMPRESSION: Tube and catheter positions as described without pneumothorax. Persistent left lower lobe atelectasis with small left pleural effusion. No new opacity evident. Stable cardiac silhouette. There is aortic atherosclerosis. Aortic Atherosclerosis (ICD10-I70.0). Electronically Signed   By: Lowella Grip III M.D.   On: 09/04/2017 07:57   Dg Chest Port 1 View  Result Date: 09/03/2017 CLINICAL DATA:  Hypoxia EXAM: PORTABLE CHEST 1 VIEW COMPARISON:  September 02, 2017 FINDINGS: Endotracheal tube tip is 2.9 cm above the carina. Swan-Ganz catheter tip is in the main pulmonary outflow tract. Nasogastric tube tip and side port are below the diaphragm. There are chest tubes bilaterally as well as a mediastinal drain present. Temporary pacemaker wires are attached to the right heart. There is a cannula to the left and to the right of the heart, unchanged in position. No pneumothorax. There is a small left pleural effusion with atelectatic change in the left lower lobe. There is mild atelectasis in the right mid lung. Lungs elsewhere clear. Heart remains enlarged with pulmonary vascularity suggesting pulmonary venous hypertension. There is aortic atherosclerosis. IMPRESSION: Tube and catheter positions as described without evident pneumothorax. Small left pleural effusion with left lower lobe atelectasis. Mild right  midlung atelectasis. Evidence of underlying pulmonary vascular congestion. There is aortic atherosclerosis. Aortic Atherosclerosis (ICD10-I70.0). Electronically Signed   By: Lowella Grip III M.D.   On: 09/03/2017 07:20   Dg Chest Port 1 View  Result Date: 09/02/2017 CLINICAL DATA:  Assess support tubes. EXAM: PORTABLE CHEST 1 VIEW COMPARISON:  New portable chest x-ray of September 02, 2017 FINDINGS: The lungs are adequately inflated. The interstitial markings are more conspicuous bilaterally today. Bilateral chest tubes are stable. There is no pneumothorax nor large pleural effusion. The cardiac silhouette is enlarged. There are post CABG changes. The pulmonary vascularity is engorged. The Swan-Ganz catheter tip projects in the proximal right pulmonary outflow tract. The endotracheal tube tip lies 4 cm above the  carina. The feeding tube and esophagogastric tube tips project below the inferior margin of the image. A mediastinal drain is present whose tip projects at the T3 level. A right-sided PICC line tip projects over the distal third of the SVC. Cannulae project over the heart to the right and left of midline. There is calcification in the wall of the aortic arch. IMPRESSION: CHF with slight interval increase in pulmonary edema. Improved left lower lobe atelectasis. No significant pleural effusion and no pneumothorax. The support tubes and devices are in reasonable position. Electronically Signed   By: [name]  Martinique M.D.   On: 09/02/2017 07:25   Dg Chest Port 1 View  Result Date: 09/02/2017 CLINICAL DATA:  Support apparatus evaluation EXAM: PORTABLE CHEST 1 VIEW COMPARISON:  Chest radiograph 09/01/2017 FINDINGS: The endotracheal tube is in unchanged position at the level of the clavicular heads. Bilateral chest tubes and central mediastinal drain are unchanged. The tip of the pulmonary arterial catheter remains overlying the main pulmonary artery. Right-sided PICC line tip is at the cavoatrial  junction. Cardiomegaly with calcific aortic atherosclerosis is unchanged. There is persistent mild to moderate pulmonary edema and consolidation of the left lower lobe. IMPRESSION: 1. Unchanged support apparatus. 2. Persistent left lower lobe consolidation and mild to moderate pulmonary edema. Electronically Signed   By: Ulyses Jarred M.D.   On: 09/02/2017 00:46   Dg Chest Port 1 View  Result Date: 09/01/2017 CLINICAL DATA:  ETT/chest tube present ,s/p cardiac surg,,resp failure EXAM: PORTABLE CHEST 1 VIEW COMPARISON:  08/31/2017 FINDINGS: Endotracheal tube terminates 3.1 cm above carina.Nasogastric tube extends beyond the inferior aspect of the film. Right internal jugular line tip at pulmonary outflow tract. There is also a right-sided PICC line which is not well visualized centrally. Mediastinal drain. Prior CABG. Bilateral chest tubes. Cardiomegaly accentuated by AP portable technique. Layering small left pleural effusion is similar. Right costophrenic angle minimally excluded. No pneumothorax. Improvement in mild interstitial edema. Persistent left base airspace disease. IMPRESSION: Slight improvement in interstitial edema. Persistent left base airspace disease and layering small left pleural effusion. Electronically Signed   By: Abigail Miyamoto M.D.   On: 09/01/2017 07:13   Dg Chest Port 1 View  Result Date: 08/31/2017 CLINICAL DATA:  Intubation. EXAM: PORTABLE CHEST 1 VIEW COMPARISON:  Earlier today as well as 08/30/2017. FINDINGS: Endotracheal tube has tip 2.5 cm above the carina. Nasogastric tube terminates below the film. Right IJ Swan-Ganz catheter has tip over the main pulmonary artery segment. Bilateral chest tubes unchanged. Right-sided PICC line has tip over the region of the cavoatrial junction unchanged. Remaining tubes and lines unchanged. Lungs are adequately inflated with improving central bilateral airspace opacification likely improving edema and atelectasis. Persistent left base  opacification likely effusion with atelectasis. Stable cardiomegaly. Remainder the exam is unchanged. IMPRESSION: Stable left base opacification likely effusion and atelectasis. Interval improvement in bilateral central airspace opacification likely improving interstitial edema. Upper cardiomegaly. Multiple tubes and lines as described. Electronically Signed   By: Marin Olp M.D.   On: 08/31/2017 20:04   Dg Chest Port 1 View  Result Date: 08/31/2017 CLINICAL DATA:  History of CABG. EXAM: PORTABLE CHEST 1 VIEW COMPARISON:  August 30, 2017 FINDINGS: The ETT is in good position. The NG tube in the feeding tube terminates below today's film. Chest tubes in are stable. No pneumothorax. Increasing focal infiltrate in the right base. Increasing opacity in the retrocardiac region. Suspected pulmonary venous congestion. Stable right PICC line. IMPRESSION: 1. Support apparatus as above.  2. Increasing infiltrate in the right base. Increasing opacity in left retrocardiac region. Recommend follow-up to resolution. Electronically Signed   By: Dorise Bullion III M.D   On: 08/31/2017 08:01   Dg Abd Portable 1v  Result Date: 09/27/2017 CLINICAL DATA:  Feeding tube placement EXAM: PORTABLE ABDOMEN - 1 VIEW COMPARISON:  09/17/2017 FINDINGS: Feeding tube tip is in the right mid abdomen consistent with location in the proximal duodenum. Gaseous distention of the stomach. Gas-filled nondilated colon is also demonstrated. IMPRESSION: Feeding tube tip is in the expected location of the second portion of the duodenum. Gaseous distention of the stomach. Electronically Signed   By: Lucienne Capers M.D.   On: 09/27/2017 00:25   Dg Abd Portable 1v  Result Date: 09/17/2017 CLINICAL DATA:  66 year old male status post feeding tube placement EXAM: PORTABLE ABDOMEN - 1 VIEW COMPARISON:  Prior abdominal radiograph 09/08/2017 FINDINGS: A transpyloric feeding tube is present. The tip of the tube overlies the fourth portion of the  duodenum. The visualized bowel gas pattern is not obstructed. Left basilar airspace opacity favored to reflect atelectasis. Patient is status post median sternotomy with evidence of prior multivessel CABG. IMPRESSION: The tip of the transpyloric feeding tube overlies the fourth portion of the duodenum. Electronically Signed   By: Jacqulynn Cadet M.D.   On: 09/17/2017 15:53   Dg Abd Portable 1v  Result Date: 09/08/2017 CLINICAL DATA:  Status post feeding tube placement. EXAM: PORTABLE ABDOMEN - 1 VIEW COMPARISON:  Abdominal radiograph of August 29, 2017 FINDINGS: A radiodense tipped feeding tube is present appears to lie in the transverse portion of the duodenum. This is not as quite distally positioned as on the previous study. There is some gas within the stomach. The retrocardiac region on the left remains dense. IMPRESSION: The tip of the feeding tube lies in the transverse portion of the duodenum. Electronically Signed   By: [name]  Martinique M.D.   On: 09/08/2017 15:32    Labs:  CBC:  Recent Labs  09/26/17 0341 09/27/17 0431 09/28/17 0835 09/28/17 1458 09/29/17 0730  WBC 6.8 6.8 6.0  --  6.3  HGB 7.3* 7.8* 8.0* 8.6* 8.5*  HCT 24.0* 25.7* 26.7* 28.4* 27.9*  PLT 175 205 235  --  264    COAGS:  Recent Labs  09/07/17 0206 09/08/17 0500 09/08/17 1145  09/17/17 0550  09/26/17 0341 09/27/17 0126 09/28/17 0835 09/29/17 0730  INR 1.56 1.61 1.58  < > 1.64  < > 2.26 2.16 1.98 1.85  APTT 74* 69* 39*  --  31  --   --   --   --   --   < > = values in this interval not displayed.  BMP:  Recent Labs  09/26/17 0341 09/27/17 0431 09/28/17 0835 09/29/17 0730  NA 140 146* 141 141  K 4.0 4.3 4.6 4.7  CL 113* 115* 118* 116*  CO2 22 23 21* 21*  GLUCOSE 324* 94 167* 69  BUN 47* 42* 36* 36*  CALCIUM 7.0* 7.5* 7.6* 7.8*  CREATININE 1.06 0.97 0.95 1.08  GFRNONAA >60 >60 >60 >60  GFRAA >60 >60 >60 >60    LIVER FUNCTION TESTS:  Recent Labs  09/22/17 0350 09/26/17 0341  09/27/17 0431 09/28/17 0835 09/29/17 0730  BILITOT 0.7  --  0.5 0.5 0.7  AST 141*  --  104* 70* 58*  ALT 80*  --  92* 74* 67*  ALKPHOS 73  --  92 103 95  PROT 6.6  --  6.6 6.6 6.7  ALBUMIN 2.1* 1.7* 1.9* 1.7* 1.9*    TUMOR MARKERS: No results for input(s): AFPTM, CEA, CA199, CHROMGRNA in the last 8760 hours.  Assessment and Plan:  Dysphagia Need for long term care Vent/trach Scheduled for percutaneous gastric tube placement Risks and benefits discussed with the patient including, but not limited to the need for a barium enema during the procedure, bleeding, infection, peritonitis, or damage to adjacent structures. All of the patient's questions were answered, patient is agreeable to proceed. Consent signed and in chart.  Will check INR daily---likely 10/24 procedure  Thank you for this interesting consult.  I greatly enjoyed meeting KAYVEN ALDACO and look forward to participating in their care.  A copy of this report was sent to the requesting provider on this date.  Electronically Signed: Lavonia Drafts, PA-C 09/29/2017, 11:50 AM   I spent a total of 40 Minutes    in face to face in clinical consultation, greater than 50% of which was counseling/coordinating care for percutaneous gastric tube placement

## 2017-09-30 LAB — COMPREHENSIVE METABOLIC PANEL
ALT: 58 U/L (ref 17–63)
AST: 51 U/L — AB (ref 15–41)
Albumin: 2 g/dL — ABNORMAL LOW (ref 3.5–5.0)
Alkaline Phosphatase: 111 U/L (ref 38–126)
Anion gap: 7 (ref 5–15)
BILIRUBIN TOTAL: 0.6 mg/dL (ref 0.3–1.2)
BUN: 33 mg/dL — AB (ref 6–20)
CO2: 20 mmol/L — ABNORMAL LOW (ref 22–32)
CREATININE: 1.1 mg/dL (ref 0.61–1.24)
Calcium: 8 mg/dL — ABNORMAL LOW (ref 8.9–10.3)
Chloride: 111 mmol/L (ref 101–111)
GFR calc Af Amer: >60 mL/min (ref >60)
Glucose, Bld: 129 mg/dL — ABNORMAL HIGH (ref 65–99)
Potassium: 4.8 mmol/L (ref 3.5–5.1)
Sodium: 138 mmol/L (ref 135–145)
TOTAL PROTEIN: 6.9 g/dL (ref 6.5–8.1)

## 2017-09-30 LAB — CBC
HCT: 27.5 % — ABNORMAL LOW (ref 39.0–52.0)
Hemoglobin: 8.5 g/dL — ABNORMAL LOW (ref 13.0–17.0)
MCH: 29.1 pg (ref 26.0–34.0)
MCHC: 30.9 g/dL (ref 30.0–36.0)
MCV: 94.2 fL (ref 78.0–100.0)
Platelets: 281 10*3/uL (ref 150–400)
RBC: 2.92 MIL/uL — ABNORMAL LOW (ref 4.22–5.81)
RDW: 16.2 % — ABNORMAL HIGH (ref 11.5–15.5)
WBC: 6 10*3/uL (ref 4.0–10.5)

## 2017-09-30 LAB — PROTIME-INR
INR: 2.05
PROTHROMBIN TIME: 23 s — AB (ref 11.4–15.2)

## 2017-10-01 ENCOUNTER — Encounter (HOSPITAL_COMMUNITY): Payer: Self-pay | Admitting: Interventional Radiology

## 2017-10-01 ENCOUNTER — Other Ambulatory Visit (HOSPITAL_COMMUNITY): Payer: PPO

## 2017-10-01 DIAGNOSIS — R1310 Dysphagia, unspecified: Secondary | ICD-10-CM | POA: Diagnosis not present

## 2017-10-01 HISTORY — PX: IR GASTROSTOMY TUBE MOD SED: IMG625

## 2017-10-01 LAB — COMPREHENSIVE METABOLIC PANEL
ALT: 53 U/L (ref 17–63)
ANION GAP: 9 (ref 5–15)
AST: 55 U/L — ABNORMAL HIGH (ref 15–41)
Albumin: 1.9 g/dL — ABNORMAL LOW (ref 3.5–5.0)
Alkaline Phosphatase: 107 U/L (ref 38–126)
BUN: 35 mg/dL — ABNORMAL HIGH (ref 6–20)
CHLORIDE: 108 mmol/L (ref 101–111)
CO2: 20 mmol/L — AB (ref 22–32)
Calcium: 8 mg/dL — ABNORMAL LOW (ref 8.9–10.3)
Creatinine, Ser: 1.17 mg/dL (ref 0.61–1.24)
GFR calc non Af Amer: >60 mL/min (ref >60)
Glucose, Bld: 90 mg/dL (ref 65–99)
POTASSIUM: 4.9 mmol/L (ref 3.5–5.1)
SODIUM: 137 mmol/L (ref 135–145)
Total Bilirubin: 0.7 mg/dL (ref 0.3–1.2)
Total Protein: 6.4 g/dL — ABNORMAL LOW (ref 6.5–8.1)

## 2017-10-01 LAB — CBC
HEMATOCRIT: 28.6 % — AB (ref 39.0–52.0)
Hemoglobin: 8.9 g/dL — ABNORMAL LOW (ref 13.0–17.0)
MCH: 28.8 pg (ref 26.0–34.0)
MCHC: 31.1 g/dL (ref 30.0–36.0)
MCV: 92.6 fL (ref 78.0–100.0)
Platelets: 266 10*3/uL (ref 150–400)
RBC: 3.09 MIL/uL — ABNORMAL LOW (ref 4.22–5.81)
RDW: 16.1 % — ABNORMAL HIGH (ref 11.5–15.5)
WBC: 6.6 10*3/uL (ref 4.0–10.5)

## 2017-10-01 LAB — PROTIME-INR
INR: 1.67
PROTHROMBIN TIME: 19.6 s — AB (ref 11.4–15.2)

## 2017-10-01 MED ORDER — FENTANYL CITRATE (PF) 100 MCG/2ML IJ SOLN
INTRAMUSCULAR | Status: AC | PRN
  Administered 2017-10-01: 50 ug via INTRAVENOUS

## 2017-10-01 MED ORDER — MIDAZOLAM HCL 2 MG/2ML IJ SOLN
INTRAMUSCULAR | Status: AC
  Filled 2017-10-01: qty 2

## 2017-10-01 MED ORDER — MIDAZOLAM HCL 2 MG/2ML IJ SOLN
INTRAMUSCULAR | Status: AC | PRN
  Administered 2017-10-01: 1 mg via INTRAVENOUS

## 2017-10-01 MED ORDER — LIDOCAINE HCL (PF) 1 % IJ SOLN
INTRAMUSCULAR | Status: AC | PRN
  Administered 2017-10-01 (×2): 5 mL

## 2017-10-01 MED ORDER — IOPAMIDOL (ISOVUE-300) INJECTION 61%
INTRAVENOUS | Status: AC
  Administered 2017-10-01: 15 mL
  Filled 2017-10-01: qty 50

## 2017-10-01 MED ORDER — LIDOCAINE HCL 1 % IJ SOLN
INTRAMUSCULAR | Status: AC
  Filled 2017-10-01: qty 20

## 2017-10-01 MED ORDER — FENTANYL CITRATE (PF) 100 MCG/2ML IJ SOLN
INTRAMUSCULAR | Status: AC
  Filled 2017-10-01: qty 2

## 2017-10-01 MED ORDER — GLUCAGON HCL RDNA (DIAGNOSTIC) 1 MG IJ SOLR
INTRAMUSCULAR | Status: AC
  Filled 2017-10-01: qty 1

## 2017-10-01 NOTE — Procedures (Signed)
Dysphagia  S/p FLUORO 20FR GTUBE  No comp Stable ebl 0 Full use tomorrow Report in pacs

## 2017-10-02 LAB — COMPREHENSIVE METABOLIC PANEL
ALBUMIN: 1.9 g/dL — AB (ref 3.5–5.0)
ALK PHOS: 119 U/L (ref 38–126)
ALT: 49 U/L (ref 17–63)
ANION GAP: 9 (ref 5–15)
AST: 51 U/L — AB (ref 15–41)
BUN: 26 mg/dL — AB (ref 6–20)
CALCIUM: 8.2 mg/dL — AB (ref 8.9–10.3)
CO2: 21 mmol/L — AB (ref 22–32)
Chloride: 105 mmol/L (ref 101–111)
Creatinine, Ser: 1.07 mg/dL (ref 0.61–1.24)
GFR calc Af Amer: >60 mL/min (ref >60)
GFR calc non Af Amer: >60 mL/min (ref >60)
GLUCOSE: 94 mg/dL (ref 65–99)
Potassium: 4.7 mmol/L (ref 3.5–5.1)
SODIUM: 135 mmol/L (ref 135–145)
Total Bilirubin: 0.9 mg/dL (ref 0.3–1.2)
Total Protein: 6.8 g/dL (ref 6.5–8.1)

## 2017-10-02 LAB — CBC
HEMATOCRIT: 29.9 % — AB (ref 39.0–52.0)
HEMOGLOBIN: 9.4 g/dL — AB (ref 13.0–17.0)
MCH: 28.7 pg (ref 26.0–34.0)
MCHC: 31.4 g/dL (ref 30.0–36.0)
MCV: 91.2 fL (ref 78.0–100.0)
Platelets: 305 10*3/uL (ref 150–400)
RBC: 3.28 MIL/uL — ABNORMAL LOW (ref 4.22–5.81)
RDW: 15.6 % — ABNORMAL HIGH (ref 11.5–15.5)
WBC: 5.8 10*3/uL (ref 4.0–10.5)

## 2017-10-02 LAB — PROTIME-INR
INR: 1.89
PROTHROMBIN TIME: 21.5 s — AB (ref 11.4–15.2)

## 2017-10-02 NOTE — Progress Notes (Signed)
Referring Physician(s): Dr. Laren Everts  Supervising Physician: Sandi Mariscal  Patient Status:  [facility] - In-pt  Chief Complaint: Dysphagia, trach-dependent respiratory failure  Subjective: Alert.  No concerns related to gastrostomy.   Allergies: Aspirin; Latex; and Oxycodone-acetaminophen  Medications: Prior to Admission medications   Medication Sig Start Date End Date Taking? Authorizing Provider  acetaminophen (TYLENOL) 650 MG CR tablet Take 1,300 mg by mouth 2 (two) times daily.    [provider]  Amino Acids-Protein Hydrolys (FEEDING SUPPLEMENT, PRO-STAT SUGAR FREE 64,) LIQD Place 30 mLs into feeding tube 3 (three) times daily. 09/26/17   Elgie Collard, PA-C  anidulafungin 100 mg in sodium chloride 0.9 % 100 mL Inject 100 mg into the vein daily. 09/27/17   Elgie Collard, PA-C  atorvastatin (LIPITOR) 80 MG tablet Place 1 tablet (80 mg total) into feeding tube daily at 6 PM. 09/26/17   Elgie Collard, PA-C  brimonidine (ALPHAGAN) 0.15 % ophthalmic solution Place 1 drop into the right eye 2 (two) times daily.  07/21/17   [provider]  budesonide (PULMICORT) 0.25 MG/2ML nebulizer solution Take 2 mLs (0.25 mg total) by nebulization 2 (two) times daily. 09/26/17   Elgie Collard, PA-C  Chlorhexidine Gluconate Cloth 2 % PADS Apply 6 each topically daily. 09/27/17   Elgie Collard, PA-C  chlorhexidine gluconate, MEDLINE KIT, (PERIDEX) 0.12 % solution 15 mLs by Mouth Rinse route 2 (two) times daily. 09/26/17   Elgie Collard, PA-C  collagenase (SANTYL) ointment Apply topically daily. 09/27/17   Elgie Collard, PA-C  fentaNYL (SUBLIMAZE) 100 MCG/2ML injection Inject 1 mL (50 mcg total) into the vein every 2 (two) hours as needed for moderate pain or severe pain. 09/26/17   Elgie Collard, PA-C  furosemide (LASIX) 10 MG/ML injection Inject 4 mLs (40 mg total) into the vein 2 (two) times daily. 09/26/17   Elgie Collard, PA-C  insulin aspart (NOVOLOG) 100 UNIT/ML injection  Inject 0-24 Units into the skin every 4 (four) hours. 09/26/17   Elgie Collard, PA-C  insulin glargine (LANTUS) 100 UNIT/ML injection Inject 0.3 mLs (30 Units total) into the skin 2 (two) times daily. 09/26/17   Elgie Collard, PA-C  ipratropium (ATROVENT) 0.02 % nebulizer solution Take 2.5 mLs (0.5 mg total) by nebulization every 6 (six) hours. 09/26/17   Elgie Collard, PA-C  lactobacillus (FLORANEX/LACTINEX) PACK Take 1 packet (1 g total) by mouth 3 (three) times daily with meals. 09/26/17   Elgie Collard, PA-C  levalbuterol (XOPENEX) 0.63 MG/3ML nebulizer solution Take 3 mLs (0.63 mg total) by nebulization every 6 (six) hours. 09/26/17   Elgie Collard, PA-C  meropenem 1 g in sodium chloride 0.9 % 100 mL Inject 1 g into the vein every 8 (eight) hours. 09/26/17 10/10/17  Elgie Collard, PA-C  metoprolol tartrate (LOPRESSOR) 5 MG/5ML SOLN injection Inject 2.5-5 mLs (2.5-5 mg total) into the vein every 2 (two) hours as needed (HR > 130 OR may use prn for SBP > 150 if HR > 70). 09/26/17   Elgie Collard, PA-C  midazolam (VERSED) 2 MG/2ML SOLN injection Inject 2 mLs (2 mg total) into the vein every 2 (two) hours as needed for agitation (to maintain RASS goal.). 09/26/17   Elgie Collard, PA-C  mouth rinse LIQD solution 15 mLs by Mouth Rinse route QID. 09/26/17   Elgie Collard, PA-C  mupirocin cream (BACTROBAN) 2 % Apply topically 2 (two) times daily. 09/26/17  Elgie Collard, PA-C  Nutritional Supplements (FEEDING SUPPLEMENT, VITAL 1.5 CAL,) LIQD Place 1,000 mLs into feeding tube continuous. 09/26/17   Elgie Collard, PA-C  ondansetron (ZOFRAN) 4 MG/2ML SOLN injection Inject 2 mLs (4 mg total) into the vein every 6 (six) hours as needed for nausea or vomiting. 09/26/17   Elgie Collard, PA-C  oxyCODONE (OXY IR/ROXICODONE) 5 MG immediate release tablet Take 1 tablet (5 mg total) by mouth every 3 (three) hours as needed for severe pain. 09/26/17   Elgie Collard, PA-C  pantoprazole sodium (PROTONIX) 40  mg/20 mL PACK Place 20 mLs (40 mg total) into feeding tube daily. 09/27/17   Elgie Collard, PA-C  potassium chloride 10 MEQ/50ML Inject 50 mLs (10 mEq total) into the vein every hour as needed (for K+ < 3.8). 09/26/17   Elgie Collard, PA-C  sodium chloride 0.9 % infusion Inject 10 mLs into the vein continuous. 09/26/17   Elgie Collard, PA-C  sodium chloride flush (NS) 0.9 % SOLN 10-40 mLs by Intracatheter route every 12 (twelve) hours. 09/26/17   Elgie Collard, PA-C  sodium chloride flush (NS) 0.9 % SOLN 10-40 mLs by Intracatheter route as needed (flush). 09/26/17   Elgie Collard, PA-C  sodium chloride flush (NS) 0.9 % SOLN Inject 3 mLs into the vein every 12 (twelve) hours. 09/26/17   Elgie Collard, PA-C  sodium chloride flush (NS) 0.9 % SOLN Inject 3 mLs into the vein as needed. 09/26/17   Elgie Collard, PA-C  spironolactone (ALDACTONE) 25 MG tablet Place 1 tablet (25 mg total) into feeding tube daily. 09/27/17   Elgie Collard, PA-C  vancomycin 500 mg in sodium chloride 0.9 % 100 mL Inject 500 mg into the vein every 12 (twelve) hours. 09/26/17   Elgie Collard, PA-C  warfarin (COUMADIN) 3 MG tablet Take 1 tablet (3 mg total) by mouth one time only at 6 PM. 09/26/17   Elgie Collard, PA-C  white petrolatum (VASELINE) OINT Apply 1 application topically 2 (two) times daily. 09/26/17   Elgie Collard, PA-C  Zinc Oxide (TRIPLE PASTE) 12.8 % ointment Apply topically as needed for irritation. 09/26/17   Elgie Collard, PA-C     Vital Signs: BP 107/74   Pulse 83   Resp (!) 31   SpO2 100%   Physical Exam  NAD, alert, trach-vent Abd:  Gastrostomy tube in place.  Insertion side c/d/i.  Ph[place] appropriately placed at level of the skin. Dressing replaced.   Imaging: Ir Gastrostomy Tube Mod Sed  Result Date: 10/01/2017 INDICATION: Respiratory failure, ventilatory support, hypoxia, dysphagia EXAM: FLUOROSCOPIC 20 FRENCH PULL-THROUGH GASTROSTOMY Date:  10/24/201810/24/2018 9:54 am  Radiologist:  M. Daryll Brod, MD Guidance:  Fluoroscopic MEDICATIONS: Ancef 2 g; Antibiotics were administered within 1 hour of the procedure. ANESTHESIA/SEDATION: Versed 1.0 mg IV; Fentanyl 50 mcg IV Moderate Sedation Time:  9 minutes The patient was continuously monitored during the procedure by the interventional radiology nurse under my direct supervision. CONTRAST:  15 cc Isovue - administered into the gastric lumen. FLUOROSCOPY TIME:  Fluoroscopy Time: 3 minutes 0 seconds (17 mGy). COMPLICATIONS: None immediate. PROCEDURE: Informed consent was obtained from the patient following explanation of the procedure, risks, benefits and alternatives. The patient understands, agrees and consents for the procedure. All questions were addressed. A time out was performed. Maximal barrier sterile technique utilized including caps, mask, sterile gowns, sterile gloves, large sterile drape, hand hygiene, and betadine prep. The left upper  quadrant was sterilely prepped and draped. An oral gastric catheter was inserted into the stomach under fluoroscopy. The existing nasogastric feeding tube was removed. Air was injected into the stomach for insufflation and visualization under fluoroscopy. The air distended stomach was confirmed beneath the anterior abdominal wall in the frontal and lateral projections. Under sterile conditions and local anesthesia, a 39 gauge trocar needle was utilized to access the stomach percutaneously beneath the left subcostal margin. Needle position was confirmed within the stomach under biplane fluoroscopy. Contrast injection confirmed position also. A single T tack was deployed for gastropexy. Over an Amplatz guide wire, a 9-French sheath was inserted into the stomach. A snare device was utilized to capture the oral gastric catheter. The snare device was pulled retrograde from the stomach up the esophagus and out the oropharynx. The 20-French pull-through gastrostomy was connected to the snare device  and pulled antegrade through the oropharynx down the esophagus into the stomach and then through the percutaneous tract external to the patient. The gastrostomy was assembled externally. Contrast injection confirms position in the stomach. Images were obtained for documentation. The patient tolerated procedure well. No immediate complication. IMPRESSION: Fluoroscopic insertion of a 20-French "pull-through" gastrostomy. Electronically Signed   By: Jerilynn Mages.  Shick M.D.   On: 10/01/2017 10:02    Labs:  CBC:  Recent Labs  09/29/17 0730 09/30/17 0649 10/01/17 0740 10/02/17 0514  WBC 6.3 6.0 6.6 5.8  HGB 8.5* 8.5* 8.9* 9.4*  HCT 27.9* 27.5* 28.6* 29.9*  PLT 264 281 266 305    COAGS:  Recent Labs  09/07/17 0206 09/08/17 0500 09/08/17 1145  09/17/17 0550  09/29/17 0730 09/30/17 0649 10/01/17 0615 10/02/17 0514  INR 1.56 1.61 1.58  < > 1.64  < > 1.85 2.05 1.67 1.89  APTT 74* 69* 39*  --  31  --   --   --   --   --   < > = values in this interval not displayed.  BMP:  Recent Labs  09/29/17 0730 09/30/17 0649 10/01/17 0615 10/02/17 0514  NA 141 138 137 135  K 4.7 4.8 4.9 4.7  CL 116* 111 108 105  CO2 21* 20* 20* 21*  GLUCOSE 69 129* 90 94  BUN 36* 33* 35* 26*  CALCIUM 7.8* 8.0* 8.0* 8.2*  CREATININE 1.08 1.10 1.17 1.07  GFRNONAA >60 >60 >60 >60  GFRAA >60 >60 >60 >60    LIVER FUNCTION TESTS:  Recent Labs  09/29/17 0730 09/30/17 0649 10/01/17 0615 10/02/17 0514  BILITOT 0.7 0.6 0.7 0.9  AST 58* 51* 55* 51*  ALT 67* 58 53 49  ALKPHOS 95 111 107 119  PROT 6.7 6.9 6.4* 6.8  ALBUMIN 1.9* 2.0* 1.9* 1.9*    Assessment and Plan: Dysphagia, trach-dependent respiratory failure s/p gastrostomy tube placement 10/01/17 Patient alert, able to answer questions with head nods.  No concerns related to gastrostomy today.  Insertion site is clean, dry, intact.  Gastrostomy ready for use.  IR available if needed.   Electronically Signed: Docia Barrier,  PA 10/02/2017, 3:45 PM   I spent a total of 15 Minutes at the the patient's bedside AND on the patient's hospital floor or unit, greater than 50% of which was counseling/coordinating care for dysphagia.

## 2017-10-03 LAB — CBC
HEMATOCRIT: 30.2 % — AB (ref 39.0–52.0)
HEMOGLOBIN: 9.7 g/dL — AB (ref 13.0–17.0)
MCH: 28.9 pg (ref 26.0–34.0)
MCHC: 32.1 g/dL (ref 30.0–36.0)
MCV: 89.9 fL (ref 78.0–100.0)
Platelets: 310 10*3/uL (ref 150–400)
RBC: 3.36 MIL/uL — AB (ref 4.22–5.81)
RDW: 15.3 % (ref 11.5–15.5)
WBC: 8 10*3/uL (ref 4.0–10.5)

## 2017-10-03 LAB — COMPREHENSIVE METABOLIC PANEL
ALT: 42 U/L (ref 17–63)
ANION GAP: 9 (ref 5–15)
AST: 43 U/L — ABNORMAL HIGH (ref 15–41)
Albumin: 2 g/dL — ABNORMAL LOW (ref 3.5–5.0)
Alkaline Phosphatase: 125 U/L (ref 38–126)
BUN: 30 mg/dL — ABNORMAL HIGH (ref 6–20)
CHLORIDE: 102 mmol/L (ref 101–111)
CO2: 21 mmol/L — AB (ref 22–32)
Calcium: 8.3 mg/dL — ABNORMAL LOW (ref 8.9–10.3)
Creatinine, Ser: 1.13 mg/dL (ref 0.61–1.24)
GFR calc non Af Amer: >60 mL/min (ref >60)
Glucose, Bld: 150 mg/dL — ABNORMAL HIGH (ref 65–99)
POTASSIUM: 4.5 mmol/L (ref 3.5–5.1)
SODIUM: 132 mmol/L — AB (ref 135–145)
Total Bilirubin: 0.7 mg/dL (ref 0.3–1.2)
Total Protein: 7.3 g/dL (ref 6.5–8.1)

## 2017-10-03 LAB — PROTIME-INR
INR: 2.43
PROTHROMBIN TIME: 26.2 s — AB (ref 11.4–15.2)

## 2017-10-04 LAB — COMPREHENSIVE METABOLIC PANEL
ALT: 40 U/L (ref 17–63)
AST: 40 U/L (ref 15–41)
Albumin: 1.9 g/dL — ABNORMAL LOW (ref 3.5–5.0)
Alkaline Phosphatase: 122 U/L (ref 38–126)
Anion gap: 8 (ref 5–15)
BUN: 33 mg/dL — AB (ref 6–20)
CHLORIDE: 97 mmol/L — AB (ref 101–111)
CO2: 23 mmol/L (ref 22–32)
Calcium: 8.2 mg/dL — ABNORMAL LOW (ref 8.9–10.3)
Creatinine, Ser: 1.03 mg/dL (ref 0.61–1.24)
GFR calc non Af Amer: >60 mL/min (ref >60)
Glucose, Bld: 260 mg/dL — ABNORMAL HIGH (ref 65–99)
POTASSIUM: 5.3 mmol/L — AB (ref 3.5–5.1)
SODIUM: 128 mmol/L — AB (ref 135–145)
Total Bilirubin: 0.6 mg/dL (ref 0.3–1.2)
Total Protein: 7 g/dL (ref 6.5–8.1)

## 2017-10-04 LAB — CBC
HEMATOCRIT: 29.7 % — AB (ref 39.0–52.0)
Hemoglobin: 9.3 g/dL — ABNORMAL LOW (ref 13.0–17.0)
MCH: 28 pg (ref 26.0–34.0)
MCHC: 31.3 g/dL (ref 30.0–36.0)
MCV: 89.5 fL (ref 78.0–100.0)
Platelets: 318 10*3/uL (ref 150–400)
RBC: 3.32 MIL/uL — ABNORMAL LOW (ref 4.22–5.81)
RDW: 15.4 % (ref 11.5–15.5)
WBC: 7.4 10*3/uL (ref 4.0–10.5)

## 2017-10-04 LAB — PROTIME-INR
INR: 3.29
Prothrombin Time: 33.2 seconds — ABNORMAL HIGH (ref 11.4–15.2)

## 2017-10-05 ENCOUNTER — Other Ambulatory Visit (HOSPITAL_COMMUNITY): Payer: PPO

## 2017-10-05 DIAGNOSIS — K567 Ileus, unspecified: Secondary | ICD-10-CM | POA: Diagnosis not present

## 2017-10-05 LAB — COMPREHENSIVE METABOLIC PANEL
ALBUMIN: 2 g/dL — AB (ref 3.5–5.0)
ALT: 59 U/L (ref 17–63)
AST: 71 U/L — AB (ref 15–41)
Alkaline Phosphatase: 134 U/L — ABNORMAL HIGH (ref 38–126)
Anion gap: 10 (ref 5–15)
BILIRUBIN TOTAL: 0.6 mg/dL (ref 0.3–1.2)
BUN: 36 mg/dL — AB (ref 6–20)
CO2: 26 mmol/L (ref 22–32)
CREATININE: 1.01 mg/dL (ref 0.61–1.24)
Calcium: 8.3 mg/dL — ABNORMAL LOW (ref 8.9–10.3)
Chloride: 97 mmol/L — ABNORMAL LOW (ref 101–111)
GFR calc Af Amer: >60 mL/min (ref >60)
GLUCOSE: 97 mg/dL (ref 65–99)
Potassium: 3.9 mmol/L (ref 3.5–5.1)
Sodium: 133 mmol/L — ABNORMAL LOW (ref 135–145)
TOTAL PROTEIN: 7.4 g/dL (ref 6.5–8.1)

## 2017-10-05 LAB — CBC
HCT: 32.3 % — ABNORMAL LOW (ref 39.0–52.0)
Hemoglobin: 10.1 g/dL — ABNORMAL LOW (ref 13.0–17.0)
MCH: 27.9 pg (ref 26.0–34.0)
MCHC: 31.3 g/dL (ref 30.0–36.0)
MCV: 89.2 fL (ref 78.0–100.0)
PLATELETS: 355 10*3/uL (ref 150–400)
RBC: 3.62 MIL/uL — ABNORMAL LOW (ref 4.22–5.81)
RDW: 15.3 % (ref 11.5–15.5)
WBC: 8.1 10*3/uL (ref 4.0–10.5)

## 2017-10-05 LAB — PROTIME-INR
INR: 2.03
PROTHROMBIN TIME: 22.8 s — AB (ref 11.4–15.2)

## 2017-10-06 LAB — COMPREHENSIVE METABOLIC PANEL
ALBUMIN: 2.2 g/dL — AB (ref 3.5–5.0)
ALT: 48 U/L (ref 17–63)
ANION GAP: 9 (ref 5–15)
AST: 50 U/L — AB (ref 15–41)
Alkaline Phosphatase: 134 U/L — ABNORMAL HIGH (ref 38–126)
BILIRUBIN TOTAL: 0.7 mg/dL (ref 0.3–1.2)
BUN: 46 mg/dL — ABNORMAL HIGH (ref 6–20)
CALCIUM: 8.3 mg/dL — AB (ref 8.9–10.3)
CHLORIDE: 97 mmol/L — AB (ref 101–111)
CO2: 25 mmol/L (ref 22–32)
Creatinine, Ser: 1.17 mg/dL (ref 0.61–1.24)
GFR calc non Af Amer: >60 mL/min (ref >60)
Glucose, Bld: 123 mg/dL — ABNORMAL HIGH (ref 65–99)
POTASSIUM: 4.1 mmol/L (ref 3.5–5.1)
Sodium: 131 mmol/L — ABNORMAL LOW (ref 135–145)
Total Protein: 7.5 g/dL (ref 6.5–8.1)

## 2017-10-06 LAB — CBC
HEMATOCRIT: 32.6 % — AB (ref 39.0–52.0)
HEMOGLOBIN: 10.3 g/dL — AB (ref 13.0–17.0)
MCH: 28 pg (ref 26.0–34.0)
MCHC: 31.6 g/dL (ref 30.0–36.0)
MCV: 88.6 fL (ref 78.0–100.0)
Platelets: 382 10*3/uL (ref 150–400)
RBC: 3.68 MIL/uL — AB (ref 4.22–5.81)
RDW: 15.4 % (ref 11.5–15.5)
WBC: 8.6 10*3/uL (ref 4.0–10.5)

## 2017-10-06 LAB — PROTIME-INR
INR: 1.95
PROTHROMBIN TIME: 22.1 s — AB (ref 11.4–15.2)

## 2017-10-07 ENCOUNTER — Encounter (HOSPITAL_COMMUNITY): Payer: Self-pay

## 2017-10-07 ENCOUNTER — Other Ambulatory Visit (HOSPITAL_COMMUNITY): Payer: PPO

## 2017-10-07 ENCOUNTER — Emergency Department (HOSPITAL_COMMUNITY): Payer: PPO | Admitting: Certified Registered Nurse Anesthetist

## 2017-10-07 ENCOUNTER — Encounter (HOSPITAL_COMMUNITY)
Admission: EM | Disposition: A | Discharge status: Nursing Home (Includes ICF) | Payer: Self-pay | Source: Home / Self Care

## 2017-10-07 ENCOUNTER — Inpatient Hospital Stay (HOSPITAL_COMMUNITY)
Admission: EM | Admit: 2017-10-07 | Discharge: 2017-10-09 | DRG: 393 | Disposition: A | Discharge status: Other Acute Inpatient Hospital | Payer: PPO | Attending: Family Medicine | Admitting: Family Medicine

## 2017-10-07 DIAGNOSIS — F17210 Nicotine dependence, cigarettes, uncomplicated: Secondary | ICD-10-CM | POA: Diagnosis not present

## 2017-10-07 DIAGNOSIS — Z720 Tobacco use: Secondary | ICD-10-CM | POA: Diagnosis present

## 2017-10-07 DIAGNOSIS — E1151 Type 2 diabetes mellitus with diabetic peripheral angiopathy without gangrene: Secondary | ICD-10-CM | POA: Diagnosis not present

## 2017-10-07 DIAGNOSIS — I130 Hypertensive heart and chronic kidney disease with heart failure and stage 1 through stage 4 chronic kidney disease, or unspecified chronic kidney disease: Secondary | ICD-10-CM | POA: Diagnosis present

## 2017-10-07 DIAGNOSIS — J431 Panlobular emphysema: Secondary | ICD-10-CM | POA: Diagnosis not present

## 2017-10-07 DIAGNOSIS — I739 Peripheral vascular disease, unspecified: Secondary | ICD-10-CM | POA: Diagnosis present

## 2017-10-07 DIAGNOSIS — Z434 Encounter for attention to other artificial openings of digestive tract: Secondary | ICD-10-CM

## 2017-10-07 DIAGNOSIS — K668 Other specified disorders of peritoneum: Secondary | ICD-10-CM

## 2017-10-07 DIAGNOSIS — E0842 Diabetes mellitus due to underlying condition with diabetic polyneuropathy: Secondary | ICD-10-CM | POA: Diagnosis present

## 2017-10-07 DIAGNOSIS — E118 Type 2 diabetes mellitus with unspecified complications: Secondary | ICD-10-CM

## 2017-10-07 DIAGNOSIS — Z951 Presence of aortocoronary bypass graft: Secondary | ICD-10-CM

## 2017-10-07 DIAGNOSIS — Z930 Tracheostomy status: Secondary | ICD-10-CM | POA: Diagnosis not present

## 2017-10-07 DIAGNOSIS — T85528A Displacement of other gastrointestinal prosthetic devices, implants and grafts, initial encounter: Secondary | ICD-10-CM

## 2017-10-07 DIAGNOSIS — Z8709 Personal history of other diseases of the respiratory system: Secondary | ICD-10-CM

## 2017-10-07 DIAGNOSIS — I10 Essential (primary) hypertension: Secondary | ICD-10-CM | POA: Diagnosis present

## 2017-10-07 DIAGNOSIS — K4020 Bilateral inguinal hernia, without obstruction or gangrene, not specified as recurrent: Secondary | ICD-10-CM | POA: Diagnosis present

## 2017-10-07 DIAGNOSIS — I4892 Unspecified atrial flutter: Secondary | ICD-10-CM | POA: Diagnosis present

## 2017-10-07 DIAGNOSIS — L89310 Pressure ulcer of right buttock, unstageable: Secondary | ICD-10-CM | POA: Diagnosis not present

## 2017-10-07 DIAGNOSIS — I4891 Unspecified atrial fibrillation: Secondary | ICD-10-CM | POA: Diagnosis not present

## 2017-10-07 DIAGNOSIS — R1310 Dysphagia, unspecified: Secondary | ICD-10-CM

## 2017-10-07 DIAGNOSIS — I509 Heart failure, unspecified: Secondary | ICD-10-CM | POA: Diagnosis not present

## 2017-10-07 DIAGNOSIS — I252 Old myocardial infarction: Secondary | ICD-10-CM

## 2017-10-07 DIAGNOSIS — E7800 Pure hypercholesterolemia, unspecified: Secondary | ICD-10-CM

## 2017-10-07 DIAGNOSIS — D62 Acute posthemorrhagic anemia: Secondary | ICD-10-CM | POA: Diagnosis not present

## 2017-10-07 DIAGNOSIS — I5032 Chronic diastolic (congestive) heart failure: Secondary | ICD-10-CM | POA: Diagnosis not present

## 2017-10-07 DIAGNOSIS — J302 Other seasonal allergic rhinitis: Secondary | ICD-10-CM | POA: Diagnosis present

## 2017-10-07 DIAGNOSIS — K631 Perforation of intestine (nontraumatic): Secondary | ICD-10-CM | POA: Diagnosis not present

## 2017-10-07 DIAGNOSIS — J449 Chronic obstructive pulmonary disease, unspecified: Secondary | ICD-10-CM | POA: Diagnosis not present

## 2017-10-07 DIAGNOSIS — D631 Anemia in chronic kidney disease: Secondary | ICD-10-CM | POA: Diagnosis present

## 2017-10-07 DIAGNOSIS — I2510 Atherosclerotic heart disease of native coronary artery without angina pectoris: Secondary | ICD-10-CM | POA: Diagnosis not present

## 2017-10-07 DIAGNOSIS — L89322 Pressure ulcer of left buttock, stage 2: Secondary | ICD-10-CM | POA: Diagnosis not present

## 2017-10-07 DIAGNOSIS — J9611 Chronic respiratory failure with hypoxia: Secondary | ICD-10-CM | POA: Diagnosis not present

## 2017-10-07 DIAGNOSIS — Z91040 Latex allergy status: Secondary | ICD-10-CM

## 2017-10-07 DIAGNOSIS — L8990 Pressure ulcer of unspecified site, unspecified stage: Secondary | ICD-10-CM | POA: Diagnosis present

## 2017-10-07 DIAGNOSIS — K56609 Unspecified intestinal obstruction, unspecified as to partial versus complete obstruction: Secondary | ICD-10-CM | POA: Insufficient documentation

## 2017-10-07 DIAGNOSIS — J9690 Respiratory failure, unspecified, unspecified whether with hypoxia or hypercapnia: Secondary | ICD-10-CM | POA: Diagnosis present

## 2017-10-07 DIAGNOSIS — J9621 Acute and chronic respiratory failure with hypoxia: Secondary | ICD-10-CM | POA: Diagnosis not present

## 2017-10-07 DIAGNOSIS — E785 Hyperlipidemia, unspecified: Secondary | ICD-10-CM | POA: Diagnosis present

## 2017-10-07 DIAGNOSIS — R570 Cardiogenic shock: Secondary | ICD-10-CM | POA: Diagnosis not present

## 2017-10-07 DIAGNOSIS — R1084 Generalized abdominal pain: Secondary | ICD-10-CM | POA: Diagnosis not present

## 2017-10-07 DIAGNOSIS — E1142 Type 2 diabetes mellitus with diabetic polyneuropathy: Secondary | ICD-10-CM | POA: Diagnosis not present

## 2017-10-07 DIAGNOSIS — N182 Chronic kidney disease, stage 2 (mild): Secondary | ICD-10-CM | POA: Diagnosis not present

## 2017-10-07 DIAGNOSIS — Z79899 Other long term (current) drug therapy: Secondary | ICD-10-CM

## 2017-10-07 DIAGNOSIS — Z8249 Family history of ischemic heart disease and other diseases of the circulatory system: Secondary | ICD-10-CM | POA: Diagnosis not present

## 2017-10-07 DIAGNOSIS — E1121 Type 2 diabetes mellitus with diabetic nephropathy: Secondary | ICD-10-CM | POA: Diagnosis not present

## 2017-10-07 DIAGNOSIS — I213 ST elevation (STEMI) myocardial infarction of unspecified site: Secondary | ICD-10-CM | POA: Diagnosis present

## 2017-10-07 DIAGNOSIS — R339 Retention of urine, unspecified: Secondary | ICD-10-CM | POA: Diagnosis not present

## 2017-10-07 DIAGNOSIS — Z431 Encounter for attention to gastrostomy: Secondary | ICD-10-CM | POA: Diagnosis not present

## 2017-10-07 DIAGNOSIS — K449 Diaphragmatic hernia without obstruction or gangrene: Secondary | ICD-10-CM | POA: Diagnosis not present

## 2017-10-07 DIAGNOSIS — Z886 Allergy status to analgesic agent status: Secondary | ICD-10-CM

## 2017-10-07 DIAGNOSIS — E119 Type 2 diabetes mellitus without complications: Secondary | ICD-10-CM

## 2017-10-07 DIAGNOSIS — Z885 Allergy status to narcotic agent status: Secondary | ICD-10-CM

## 2017-10-07 DIAGNOSIS — N183 Chronic kidney disease, stage 3 (moderate): Secondary | ICD-10-CM | POA: Diagnosis not present

## 2017-10-07 DIAGNOSIS — Z7951 Long term (current) use of inhaled steroids: Secondary | ICD-10-CM

## 2017-10-07 DIAGNOSIS — Z794 Long term (current) use of insulin: Secondary | ICD-10-CM

## 2017-10-07 HISTORY — PX: LAPAROTOMY: SHX154

## 2017-10-07 HISTORY — PX: REMOVAL OF GASTROSTOMY TUBE: SHX6058

## 2017-10-07 LAB — BASIC METABOLIC PANEL
ANION GAP: 10 (ref 5–15)
BUN: 41 mg/dL — ABNORMAL HIGH (ref 6–20)
CALCIUM: 8.2 mg/dL — AB (ref 8.9–10.3)
CO2: 24 mmol/L (ref 22–32)
CREATININE: 1.02 mg/dL (ref 0.61–1.24)
Chloride: 99 mmol/L — ABNORMAL LOW (ref 101–111)
Glucose, Bld: 105 mg/dL — ABNORMAL HIGH (ref 65–99)
Potassium: 3.8 mmol/L (ref 3.5–5.1)
SODIUM: 133 mmol/L — AB (ref 135–145)

## 2017-10-07 LAB — CBC WITH DIFFERENTIAL/PLATELET
BASOS ABS: 0 10*3/uL (ref 0.0–0.1)
Basophils Relative: 0 %
Eosinophils Absolute: 0.2 10*3/uL (ref 0.0–0.7)
Eosinophils Relative: 2 %
HEMATOCRIT: 32.5 % — AB (ref 39.0–52.0)
Hemoglobin: 10.4 g/dL — ABNORMAL LOW (ref 13.0–17.0)
LYMPHS ABS: 1.3 10*3/uL (ref 0.7–4.0)
LYMPHS PCT: 15 %
MCH: 28.3 pg (ref 26.0–34.0)
MCHC: 32 g/dL (ref 30.0–36.0)
MCV: 88.6 fL (ref 78.0–100.0)
Monocytes Absolute: 0.4 10*3/uL (ref 0.1–1.0)
Monocytes Relative: 5 %
Neutro Abs: 6.3 10*3/uL (ref 1.7–7.7)
Neutrophils Relative %: 78 %
PLATELETS: 334 10*3/uL (ref 150–400)
RBC: 3.67 MIL/uL — ABNORMAL LOW (ref 4.22–5.81)
RDW: 15.1 % (ref 11.5–15.5)
WBC: 8.2 10*3/uL (ref 4.0–10.5)

## 2017-10-07 LAB — PROTIME-INR
INR: 2.58
PROTHROMBIN TIME: 27.5 s — AB (ref 11.4–15.2)

## 2017-10-07 LAB — GLUCOSE, CAPILLARY
GLUCOSE-CAPILLARY: 124 mg/dL — AB (ref 65–99)
Glucose-Capillary: 95 mg/dL (ref 65–99)
Glucose-Capillary: 115 mg/dL — ABNORMAL HIGH (ref 65–99)

## 2017-10-07 LAB — MRSA PCR SCREENING: MRSA by PCR: NEGATIVE

## 2017-10-07 LAB — TROPONIN I: Troponin I: <0.03 ng/mL (ref <0.03)

## 2017-10-07 SURGERY — LAPAROTOMY, EXPLORATORY
Anesthesia: General | Site: Abdomen

## 2017-10-07 MED ORDER — MEPERIDINE HCL 25 MG/ML IJ SOLN: 6.2500 mg | INTRAMUSCULAR | Status: DC | PRN

## 2017-10-07 MED ORDER — LACTATED RINGERS IV SOLN
INTRAVENOUS | Status: DC | PRN
  Administered 2017-10-07: 14:00:00 via INTRAVENOUS

## 2017-10-07 MED ORDER — ENOXAPARIN SODIUM 40 MG/0.4ML ~~LOC~~ SOLN
40.0000 mg | SUBCUTANEOUS | Status: DC
  Administered 2017-10-08 – 2017-10-09 (×2): 40 mg via SUBCUTANEOUS
  Filled 2017-10-07 (×2): qty 0.4

## 2017-10-07 MED ORDER — DIPHENHYDRAMINE HCL 12.5 MG/5ML PO ELIX: 12.5000 mg | ORAL_SOLUTION | Freq: Four times a day (QID) | ORAL | Status: DC | PRN

## 2017-10-07 MED ORDER — DIPHENHYDRAMINE HCL 50 MG/ML IJ SOLN: 12.5000 mg | Freq: Four times a day (QID) | INTRAMUSCULAR | Status: DC | PRN

## 2017-10-07 MED ORDER — DEXTROSE 5 % IV SOLN
2.0000 g | INTRAVENOUS | Status: AC
  Administered 2017-10-07: 2 g via INTRAVENOUS
  Filled 2017-10-07: qty 2

## 2017-10-07 MED ORDER — POTASSIUM CHLORIDE IN NACL 20-0.9 MEQ/L-% IV SOLN
INTRAVENOUS | Status: DC
  Administered 2017-10-07 – 2017-10-08 (×2): via INTRAVENOUS
  Filled 2017-10-07 (×2): qty 1000

## 2017-10-07 MED ORDER — ONDANSETRON 4 MG PO TBDP: 4.0000 mg | ORAL_TABLET | Freq: Four times a day (QID) | ORAL | Status: DC | PRN

## 2017-10-07 MED ORDER — ONDANSETRON HCL 4 MG/2ML IJ SOLN
INTRAMUSCULAR | Status: DC | PRN
  Administered 2017-10-07: 4 mg via INTRAVENOUS

## 2017-10-07 MED ORDER — INSULIN ASPART 100 UNIT/ML ~~LOC~~ SOLN
0.0000 [IU] | SUBCUTANEOUS | Status: DC
  Administered 2017-10-08 – 2017-10-09 (×4): 1 [IU] via SUBCUTANEOUS

## 2017-10-07 MED ORDER — ONDANSETRON HCL 4 MG/2ML IJ SOLN
INTRAMUSCULAR | Status: AC
  Filled 2017-10-07: qty 2

## 2017-10-07 MED ORDER — BUDESONIDE 0.25 MG/2ML IN SUSP
0.2500 mg | Freq: Two times a day (BID) | RESPIRATORY_TRACT | Status: DC
Start: 2017-10-07 — End: 2017-10-09
  Administered 2017-10-07 – 2017-10-09 (×4): 0.25 mg via RESPIRATORY_TRACT
  Filled 2017-10-07 (×4): qty 2

## 2017-10-07 MED ORDER — PHENYLEPHRINE HCL 10 MG/ML IJ SOLN
INTRAVENOUS | Status: DC | PRN
  Administered 2017-10-07: 30 ug/min via INTRAVENOUS

## 2017-10-07 MED ORDER — SODIUM CHLORIDE 0.9 % IV SOLN: INTRAVENOUS | Status: DC

## 2017-10-07 MED ORDER — FENTANYL CITRATE (PF) 250 MCG/5ML IJ SOLN
INTRAMUSCULAR | Status: AC
  Filled 2017-10-07: qty 5

## 2017-10-07 MED ORDER — MIDAZOLAM HCL 2 MG/2ML IJ SOLN
INTRAMUSCULAR | Status: AC
  Filled 2017-10-07: qty 2

## 2017-10-07 MED ORDER — ROCURONIUM BROMIDE 10 MG/ML (PF) SYRINGE
PREFILLED_SYRINGE | INTRAVENOUS | Status: DC | PRN
  Administered 2017-10-07: 20 mg via INTRAVENOUS
  Administered 2017-10-07: 50 mg via INTRAVENOUS

## 2017-10-07 MED ORDER — ONDANSETRON HCL 4 MG/2ML IJ SOLN: 4.0000 mg | Freq: Four times a day (QID) | INTRAMUSCULAR | Status: DC | PRN

## 2017-10-07 MED ORDER — HYDROMORPHONE HCL 1 MG/ML IJ SOLN
0.2500 mg | INTRAMUSCULAR | Status: DC | PRN
  Administered 2017-10-07 (×2): 0.5 mg via INTRAVENOUS

## 2017-10-07 MED ORDER — HYDROMORPHONE HCL 1 MG/ML IJ SOLN
1.0000 mg | INTRAMUSCULAR | Status: DC | PRN
  Administered 2017-10-07 – 2017-10-09 (×10): 1 mg via INTRAVENOUS
  Filled 2017-10-07 (×10): qty 1

## 2017-10-07 MED ORDER — PROPOFOL 10 MG/ML IV BOLUS
INTRAVENOUS | Status: AC
  Filled 2017-10-07: qty 20

## 2017-10-07 MED ORDER — PROPOFOL 10 MG/ML IV BOLUS
INTRAVENOUS | Status: DC | PRN
  Administered 2017-10-07: 100 mg via INTRAVENOUS

## 2017-10-07 MED ORDER — DEXAMETHASONE SODIUM PHOSPHATE 10 MG/ML IJ SOLN
INTRAMUSCULAR | Status: DC | PRN
  Administered 2017-10-07: 8 mg via INTRAVENOUS

## 2017-10-07 MED ORDER — MIDAZOLAM HCL 2 MG/2ML IJ SOLN
INTRAMUSCULAR | Status: DC | PRN
  Administered 2017-10-07: 1 mg via INTRAVENOUS

## 2017-10-07 MED ORDER — SUGAMMADEX SODIUM 200 MG/2ML IV SOLN
INTRAVENOUS | Status: DC | PRN
  Administered 2017-10-07 (×2): 100 mg via INTRAVENOUS

## 2017-10-07 MED ORDER — PROMETHAZINE HCL 25 MG/ML IJ SOLN: 6.2500 mg | INTRAMUSCULAR | Status: DC | PRN

## 2017-10-07 MED ORDER — HYDROMORPHONE HCL 1 MG/ML IJ SOLN
INTRAMUSCULAR | Status: AC
  Filled 2017-10-07: qty 1

## 2017-10-07 MED ORDER — DEXAMETHASONE SODIUM PHOSPHATE 10 MG/ML IJ SOLN
INTRAMUSCULAR | Status: AC
  Filled 2017-10-07: qty 1

## 2017-10-07 MED ORDER — FENTANYL CITRATE (PF) 250 MCG/5ML IJ SOLN
INTRAMUSCULAR | Status: DC | PRN
  Administered 2017-10-07: 50 ug via INTRAVENOUS
  Administered 2017-10-07: 100 ug via INTRAVENOUS

## 2017-10-07 MED ORDER — 0.9 % SODIUM CHLORIDE (POUR BTL) OPTIME
TOPICAL | Status: DC | PRN
  Administered 2017-10-07: 1000 mL

## 2017-10-07 MED ORDER — SUGAMMADEX SODIUM 200 MG/2ML IV SOLN
INTRAVENOUS | Status: AC
  Filled 2017-10-07: qty 2

## 2017-10-07 MED ORDER — ONDANSETRON HCL 4 MG PO TABS: 4.0000 mg | ORAL_TABLET | Freq: Four times a day (QID) | ORAL | Status: DC | PRN

## 2017-10-07 MED ORDER — BISACODYL 10 MG RE SUPP: 10.0000 mg | Freq: Every day | RECTAL | Status: DC | PRN

## 2017-10-07 MED ORDER — SODIUM CHLORIDE 0.9 % IV SOLN: Freq: Once | INTRAVENOUS | Status: DC

## 2017-10-07 MED ORDER — PHENYLEPHRINE 40 MCG/ML (10ML) SYRINGE FOR IV PUSH (FOR BLOOD PRESSURE SUPPORT)
PREFILLED_SYRINGE | INTRAVENOUS | Status: DC | PRN
  Administered 2017-10-07: 80 ug via INTRAVENOUS

## 2017-10-07 MED ORDER — SENNOSIDES-DOCUSATE SODIUM 8.6-50 MG PO TABS: 1.0000 | ORAL_TABLET | Freq: Every evening | ORAL | Status: DC | PRN

## 2017-10-07 SURGICAL SUPPLY — 44 items
BLADE CLIPPER SURG (BLADE) IMPLANT
CANISTER SUCT 3000ML PPV (MISCELLANEOUS) ×3 IMPLANT
CATH GASTROSTOMY 20FR (CATHETERS) ×2 IMPLANT
COVER SURGICAL LIGHT HANDLE (MISCELLANEOUS) ×3 IMPLANT
DRAPE LAPAROSCOPIC ABDOMINAL (DRAPES) ×3 IMPLANT
DRAPE WARM FLUID 44X44 (DRAPE) ×3 IMPLANT
DRSG OPSITE POSTOP 4X10 (GAUZE/BANDAGES/DRESSINGS) IMPLANT
DRSG OPSITE POSTOP 4X8 (GAUZE/BANDAGES/DRESSINGS) IMPLANT
ELECT BLADE 6.5 EXT (BLADE) IMPLANT
ELECT CAUTERY BLADE 6.4 (BLADE) ×3 IMPLANT
ELECT REM PT RETURN 9FT ADLT (ELECTROSURGICAL) ×3
ELECTRODE REM PT RTRN 9FT ADLT (ELECTROSURGICAL) ×1 IMPLANT
GAUZE SPONGE 4X4 12PLY STRL (GAUZE/BANDAGES/DRESSINGS) ×2 IMPLANT
GLOVE SURG SIGNA 7.5 PF LTX (GLOVE) ×3 IMPLANT
GOWN STRL REUS W/ TWL LRG LVL3 (GOWN DISPOSABLE) ×1 IMPLANT
GOWN STRL REUS W/ TWL XL LVL3 (GOWN DISPOSABLE) ×1 IMPLANT
GOWN STRL REUS W/TWL LRG LVL3 (GOWN DISPOSABLE) ×3
GOWN STRL REUS W/TWL XL LVL3 (GOWN DISPOSABLE) ×3
HANDLE SUCTION POOLE (INSTRUMENTS) IMPLANT
KIT BASIN OR (CUSTOM PROCEDURE TRAY) ×3 IMPLANT
KIT ROOM TURNOVER OR (KITS) ×3 IMPLANT
LIGASURE IMPACT 36 18CM CVD LR (INSTRUMENTS) IMPLANT
NS IRRIG 1000ML POUR BTL (IV SOLUTION) ×6 IMPLANT
PACK GENERAL/GYN (CUSTOM PROCEDURE TRAY) ×3 IMPLANT
PAD ARMBOARD 7.5X6 YLW CONV (MISCELLANEOUS) ×3 IMPLANT
SPECIMEN JAR LARGE (MISCELLANEOUS) IMPLANT
SPONGE DRAIN TRACH 4X4 STRL 2S (GAUZE/BANDAGES/DRESSINGS) ×2 IMPLANT
SPONGE LAP 18X18 X RAY DECT (DISPOSABLE) IMPLANT
STAPLER VISISTAT 35W (STAPLE) ×3 IMPLANT
SUCTION POOLE HANDLE (INSTRUMENTS) ×3
SUCTION POOLE TIP (SUCTIONS) ×3 IMPLANT
SUT ETHILON 2 0 FS 18 (SUTURE) ×4 IMPLANT
SUT PDS AB 1 TP1 96 (SUTURE) ×6 IMPLANT
SUT SILK 2 0 SH (SUTURE) ×2 IMPLANT
SUT SILK 2 0 SH CR/8 (SUTURE) ×5 IMPLANT
SUT SILK 2 0 TIES 10X30 (SUTURE) ×3 IMPLANT
SUT SILK 3 0 SH CR/8 (SUTURE) ×3 IMPLANT
SUT SILK 3 0 TIES 10X30 (SUTURE) ×3 IMPLANT
SUT VIC AB 3-0 SH 18 (SUTURE) IMPLANT
TAPE CLOTH SURG 6X10 WHT LF (GAUZE/BANDAGES/DRESSINGS) ×2 IMPLANT
TOWEL OR 17X24 6PK STRL BLUE (TOWEL DISPOSABLE) ×3 IMPLANT
TOWEL OR 17X26 10 PK STRL BLUE (TOWEL DISPOSABLE) ×3 IMPLANT
TRAY FOLEY W/METER SILVER 16FR (SET/KITS/TRAYS/PACK) IMPLANT
YANKAUER SUCT BULB TIP NO VENT (SUCTIONS) IMPLANT

## 2017-10-07 NOTE — Anesthesia Postprocedure Evaluation (Signed)
Anesthesia Post Note  Patient: Rhae HammockReginald E Rosol  Procedure(s) Performed: EXPLORATORY LAPAROTOMY (N/A Abdomen) REMOVAL AND REPLACEMENT OF GASTROSTOMY TUBE (N/A Abdomen)     Patient location during evaluation: PACU Anesthesia Type: General Level of consciousness: sedated and patient cooperative Pain management: pain level controlled Vital Signs Assessment: post-procedure vital signs reviewed and stable Respiratory status: spontaneous breathing Cardiovascular status: stable Anesthetic complications: no    Last Vitals:  Vitals:   10/07/17 1630 10/07/17 1635  BP:  121/69  Pulse: 84 85  Resp: 19 (!) 24  Temp:    SpO2: 95% 94%    Last Pain:  Vitals:   10/07/17 1644  TempSrc:   PainSc: Asleep                 Lewie LoronJohn Lamar Naef

## 2017-10-07 NOTE — Transfer of Care (Signed)
Immediate Anesthesia Transfer of Care Note  Patient: Clinton Harrington  Procedure(s) Performed: EXPLORATORY LAPAROTOMY (N/A Abdomen) REMOVAL AND REPLACEMENT OF GASTROSTOMY TUBE (N/A Abdomen)  Patient Location: PACU  Anesthesia Type:General  Level of Consciousness: awake, alert , oriented and patient cooperative  Airway & Oxygen Therapy: Patient Spontanous Breathing and Patient connected to tracheostomy mask oxygen  Post-op Assessment: Report given to RN, Post -op Vital signs reviewed and stable and Patient moving all extremities X 4  Post vital signs: Reviewed and stable  Last Vitals:  Vitals:   10/07/17 1413 10/07/17 1536  BP: 120/77 114/74  Pulse: 92 80  Resp: 20 15  Temp: 36.9 C (P) 36.7 C  SpO2: 97% 98%    Last Pain:  Vitals:   10/07/17 1413  TempSrc: Oral         Complications: No apparent anesthesia complications

## 2017-10-07 NOTE — Progress Notes (Signed)
Patient ID: Clinton HammockReginald E Heart, male   DOB: [birth year], 66 y.o.   MRN: [id number]   This gentleman has just been transferred down to the emergency department from [facility].  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 On October 24, he underwent a gastrostomy tube insertion by interventional radiology.  He has been having worsening abdominal pain and distention.  He underwent a CT scan showing a large amount of pneumoperitoneum.  The gastrostomy tube appeared to be in the stomach.  No other obvious source of free air is been identified. On physical examination, he is uncomfortable in appearance.  His abdomen is soft but significantly tender diffusely and greatest on the left side of the abdomen.  I discussed this with the patient in detail.  Given the amount of free air and his physical examination, I believe he needs an exploratory laparotomy to look for the source of his free air.  This could either be from the stomach pulling away from the abdominal wall or from a perforation from the procedure or an occult perforation.  I discussed the risk of the procedure which include but is not limited to bleeding, infection, injury to surrounding structures, the need for further procedures, cardiopulmonary issues, etc.  We discussed the potential for worsening of his medical condition without surgery.  He agrees to proceed with surgery which is scheduled emergently.  FFP will be ordered.

## 2017-10-07 NOTE — Consult Note (Signed)
Reason for Consult: Free air Referring Physician: [place], Ciavarella is an 66 y.o. male.  HPI: 66 year old admitted on 08/23/17 with MI.  Medical management with aortic balloon pump.  Cardiac catheterization led to a recommendation of coronary artery bypass grafting.  On 920/18 he underwent coronary artery bypass grafting, and placement of percutaneous right ventricular assist device by Dr. Dahlia Byes.  Postop sternum was not closed initially secondary to RV dysfunction.  He also required IABP He had a prolonged illness and was finally transferred to [facility] on 09/26/17.     Pt had a Flexi-seal for loose stool up until the around the date of his gastrostomy.  He was being fed thru an NG tube prior to that.  Flexiseal was removed around 10/01/17 because he was putting out less and less stool.   He has had some persistent distention of the stomach small bowel colon. Films obtained this AM for small bowel obstruction shows gastrostomy tube is in place to left upper quadrant. Ileus may be present.  Along with possible free air.  CT scan was obtained.  This shows extensive pneumoperitoneum concerning for viscus perforation.  No bowel obstruction or abscess was evident.  No periappendiceal regional inflammation.  Gallbladder was distended with small stones.  Inguinal hernias bilaterally.  He is being transferred to the ER for evaluation.  On Exam he is tender.  The CT does not show any sign of abscess or give a good cause for the free air.  Dr. Ninfa Linden has seen him and discussed options.  He recommended surgery to see if we can find out where the air is coming from.  He noted that the patient could have it from the gastrostomy tube or some other form of intraabdominal viscus perforation.  We plan exploratory laparotomy ASAP.  His INR is elevated and FFP has been ordered.      Past Medical History:  Diagnosis Date  . Arthritis   . Diabetes ([place])   . Diabetes mellitus Jan.  1999  . High cholesterol   . Hypertension   . Myocardial infarct ([place])   . Peripheral vascular disease ([place])   . Seasonal allergies   . Smoker   . Urine incontinence     Past Surgical History:  Procedure Laterality Date  . CORONARY ARTERY BYPASS GRAFT N/A 08/28/2017   Procedure: CORONARY ARTERY BYPASS GRAFTING (CABG) times four using left internal mammary artery and right greater saphenous leg vein using endoscope.;  Surgeon: Ivin Poot, MD;  Location: [place];  Service: Open Heart Surgery;  Laterality: N/A;  . CORONARY STENT PLACEMENT  Jan.  1999  . FEMORAL-POPLITEAL BYPASS GRAFT Left 01/03/2016   Procedure: LEFT COMMON FEMORAL-BELOW KNEE POPLITEAL ARTERY BYPASS GRAFT USING NON REVERSED TRANSLOCATED SAPHENOUS VEIN GRAFT;  Surgeon: Mal Misty, MD;  Location: [place];  Service: Vascular;  Laterality: Left;  . IABP INSERTION N/A 08/23/2017   Procedure: IABP Insertion;  Surgeon: Lorretta Harp, MD;  Location: [place] [facility];  Service: Cardiovascular;  Laterality: N/A;  . INTRAOPERATIVE ARTERIOGRAM Left 01/03/2016   Procedure: INTRA OPERATIVE ARTERIOGRAM LEFT LEG;  Surgeon: Mal Misty, MD;  Location: [place];  Service: Vascular;  Laterality: Left;  . IR GASTROSTOMY TUBE MOD SED  10/01/2017  . LEFT HEART CATH AND CORS/GRAFTS ANGIOGRAPHY N/A 08/23/2017   Procedure: LEFT HEART CATH AND CORS/GRAFTS ANGIOGRAPHY;  Surgeon: Lorretta Harp, MD;  Location: [place] [facility];  Service: Cardiovascular;  Laterality: N/A;  .  PERIPHERAL VASCULAR CATHETERIZATION N/A 01/01/2016   Procedure: Abdominal Aortogram w/Lower Extremity;  Surgeon: Conrad Tescott, MD;  Location: [place] [facility];  Service: Cardiovascular;  Laterality: N/A;  . PERIPHERAL VASCULAR CATHETERIZATION Left 01/01/2016   Procedure: Peripheral Vascular Balloon Angioplasty;  Surgeon: Conrad De Soto, MD;  Location: [place] [facility];  Service: Cardiovascular;  Laterality: Left;  Popliteal artery  . PERIPHERAL VASCULAR CATHETERIZATION  N/A 12/04/2016   Procedure: Abdominal Aortogram w/Lower Extremity;  Surgeon: Waynetta Sandy, MD;  Location: [place] [facility];  Service: Cardiovascular;  Laterality: N/A;  . PERIPHERAL VASCULAR CATHETERIZATION Left 12/04/2016   Procedure: Peripheral Vascular Balloon Angioplasty;  Surgeon: Waynetta Sandy, MD;  Location: [place];  Service: Cardiovascular;  Laterality: Left;  Fem_Pop bypass  . PLACEMENT OF CENTRIMAG VENTRICULAR ASSIST DEVICE Right 08/28/2017   Procedure: PLACEMENT OF CENTRIMAG VENTRICULAR ASSIST DEVICE;  Surgeon: Ivin Poot, MD;  Location: [place];  Service: Open Heart Surgery;  Laterality: Right;  . REMOVAL OF CENTRIMAG VENTRICULAR ASSIST DEVICE N/A 09/08/2017   Procedure: REMOVAL OF R-VENTRICULAR ASSIST DEVICE WITH CARDIOPULMONARY BYPASS;  Surgeon: Ivin Poot, MD;  Location: [place];  Service: Open Heart Surgery;  Laterality: N/A;  . TEE WITHOUT CARDIOVERSION N/A 08/28/2017   Procedure: TRANSESOPHAGEAL ECHOCARDIOGRAM (TEE);  Surgeon: Prescott Gum, Collier Salina, MD;  Location: [place];  Service: Open Heart Surgery;  Laterality: N/A;  . TEE WITHOUT CARDIOVERSION N/A 09/08/2017   Procedure: TRANSESOPHAGEAL ECHOCARDIOGRAM (TEE);  Surgeon: Prescott Gum, Collier Salina, MD;  Location: [place];  Service: Open Heart Surgery;  Laterality: N/A;    Family History  Problem Relation Age of Onset  . Arthritis Mother   . Hypertension Mother   . Heart disease Maternal Grandmother   . Mental illness Maternal Grandmother     Social History:  reports that he has been smoking Cigarettes.  He has a 22.50 pack-year smoking history. He has never used smokeless tobacco. He reports that he does not drink alcohol or use drugs.  Allergies:  Allergies  Allergen Reactions  . Aspirin Other (See Comments)    Stomach upset  . Latex Rash  . Oxycodone-Acetaminophen Itching    Medications:  Prior to Admission:  Prescriptions Prior to Admission  Medication Sig Dispense Refill Last Dose  .  acetaminophen (TYLENOL) 650 MG CR tablet Take 1,300 mg by mouth 2 (two) times daily.   Past Week at Unknown time  . Amino Acids-Protein Hydrolys (FEEDING SUPPLEMENT, PRO-STAT SUGAR FREE 64,) LIQD Place 30 mLs into feeding tube 3 (three) times daily. 900 mL 0   . anidulafungin 100 mg in sodium chloride 0.9 % 100 mL Inject 100 mg into the vein daily. 2 g 1   . atorvastatin (LIPITOR) 80 MG tablet Place 1 tablet (80 mg total) into feeding tube daily at 6 PM. 30 tablet 1   . brimonidine (ALPHAGAN) 0.15 % ophthalmic solution Place 1 drop into the right eye 2 (two) times daily.    Past Week at Unknown time  . budesonide (PULMICORT) 0.25 MG/2ML nebulizer solution Take 2 mLs (0.25 mg total) by nebulization 2 (two) times daily. 60 mL 12   . Chlorhexidine Gluconate Cloth 2 % PADS Apply 6 each topically daily. 6 each 2   . chlorhexidine gluconate, MEDLINE KIT, (PERIDEX) 0.12 % solution 15 mLs by Mouth Rinse route 2 (two) times daily. 120 mL 0   . collagenase (SANTYL) ointment Apply topically daily. 15 g 0   . fentaNYL (SUBLIMAZE) 100 MCG/2ML injection Inject  1 mL (50 mcg total) into the vein every 2 (two) hours as needed for moderate pain or severe pain. 2 mL 0   . furosemide (LASIX) 10 MG/ML injection Inject 4 mLs (40 mg total) into the vein 2 (two) times daily. 4 mL 0   . insulin aspart (NOVOLOG) 100 UNIT/ML injection Inject 0-24 Units into the skin every 4 (four) hours. 10 mL 11   . insulin glargine (LANTUS) 100 UNIT/ML injection Inject 0.3 mLs (30 Units total) into the skin 2 (two) times daily. 10 mL 11   . ipratropium (ATROVENT) 0.02 % nebulizer solution Take 2.5 mLs (0.5 mg total) by nebulization every 6 (six) hours. 75 mL 12   . lactobacillus (FLORANEX/LACTINEX) PACK Take 1 packet (1 g total) by mouth 3 (three) times daily with meals. 12 each 1   . levalbuterol (XOPENEX) 0.63 MG/3ML nebulizer solution Take 3 mLs (0.63 mg total) by nebulization every 6 (six) hours. 3 mL 12   . meropenem 1 g in sodium  chloride 0.9 % 100 mL Inject 1 g into the vein every 8 (eight) hours. 90 g 1   . metoprolol tartrate (LOPRESSOR) 5 MG/5ML SOLN injection Inject 2.5-5 mLs (2.5-5 mg total) into the vein every 2 (two) hours as needed (HR > 130 OR may use prn for SBP > 150 if HR > 70). 15 mL 2   . midazolam (VERSED) 2 MG/2ML SOLN injection Inject 2 mLs (2 mg total) into the vein every 2 (two) hours as needed for agitation (to maintain RASS goal.). 2.5 mL 0   . mouth rinse LIQD solution 15 mLs by Mouth Rinse route QID. 7 mL 1   . mupirocin cream (BACTROBAN) 2 % Apply topically 2 (two) times daily. 15 g 0   . Nutritional Supplements (FEEDING SUPPLEMENT, VITAL 1.5 CAL,) LIQD Place 1,000 mLs into feeding tube continuous. 1000 mL 30   . ondansetron (ZOFRAN) 4 MG/2ML SOLN injection Inject 2 mLs (4 mg total) into the vein every 6 (six) hours as needed for nausea or vomiting. 2 mL 0   . oxyCODONE (OXY IR/ROXICODONE) 5 MG immediate release tablet Take 1 tablet (5 mg total) by mouth every 3 (three) hours as needed for severe pain. 30 tablet 0   . pantoprazole sodium (PROTONIX) 40 mg/20 mL PACK Place 20 mLs (40 mg total) into feeding tube daily. 30 each 4   . potassium chloride 10 MEQ/50ML Inject 50 mLs (10 mEq total) into the vein every hour as needed (for K+ < 3.8). 50 mL 4   . sodium chloride 0.9 % infusion Inject 10 mLs into the vein continuous. 1000 mL 0   . sodium chloride flush (NS) 0.9 % SOLN 10-40 mLs by Intracatheter route every 12 (twelve) hours. 5 mL 1   . sodium chloride flush (NS) 0.9 % SOLN 10-40 mLs by Intracatheter route as needed (flush). 5 mL 1   . sodium chloride flush (NS) 0.9 % SOLN Inject 3 mLs into the vein every 12 (twelve) hours. 5 mL 1   . sodium chloride flush (NS) 0.9 % SOLN Inject 3 mLs into the vein as needed. 5 mL 1   . spironolactone (ALDACTONE) 25 MG tablet Place 1 tablet (25 mg total) into feeding tube daily. 30 tablet 1   . vancomycin 500 mg in sodium chloride 0.9 % 100 mL Inject 500 mg into the  vein every 12 (twelve) hours. 500 mg 14   . warfarin (COUMADIN) 3 MG tablet Take 1 tablet (3  mg total) by mouth one time only at 6 PM. 30 tablet 1   . white petrolatum (VASELINE) OINT Apply 1 application topically 2 (two) times daily. 15000 g 0   . Zinc Oxide (TRIPLE PASTE) 12.8 % ointment Apply topically as needed for irritation. 56.7 g 0    Scheduled:  Continuous: . [MAR Hold] sodium chloride    . [MAR Hold] cefoTEtan (CEFOTAN) IV     PRN: Anti-infectives    Start     Dose/Rate Route Frequency Ordered Stop   10/07/17 1330  [MAR Hold]  cefoTEtan (CEFOTAN) 2 g in dextrose 5 % 50 mL IVPB     (MAR Hold since 10/07/17 1407)   2 g 100 mL/hr over 30 Minutes Intravenous NOW 10/07/17 1328 10/08/17 1330      Results for orders placed or performed during the hospital encounter of 10/07/17 (from the past 48 hour(s))  Prepare fresh frozen plasma     Status: None (Preliminary result)   Collection Time: 10/07/17  1:26 PM  Result Value Ref Range   Unit Number [id number]    Blood Component Type THAWED PLASMA    Unit division 00    Status of Unit ALLOCATED    Transfusion Status OK TO TRANSFUSE    Unit Number [id number]    Blood Component Type THW PLS APHR    Unit division 00    Status of Unit ALLOCATED    Transfusion Status OK TO TRANSFUSE    Unit Number [id number]    Blood Component Type THAWED PLASMA    Unit division 00    Status of Unit ALLOCATED    Transfusion Status OK TO TRANSFUSE     Ct Abdomen Pelvis Wo Contrast  Result Date: 10/07/2017 CLINICAL DATA:  Severe abdominal pain EXAM: CT ABDOMEN AND PELVIS WITHOUT CONTRAST TECHNIQUE: Multidetector CT imaging of the abdomen and pelvis was performed following the standard protocol without oral or intravenous contrast maternal administration. COMPARISON:  September 22, 2017 FINDINGS: Lower chest: There are pleural effusions bilaterally with left base consolidation. There are multiple foci of coronary artery calcification. There is  a small pericardial effusion. Hepatobiliary: No focal liver lesions are evident on this noncontrast enhanced study. There is sludge in a distended gallbladder. There is a gallstone present within the gallbladder. There is no appreciable gallbladder wall thickening. No biliary duct dilatation evident. Pancreas: There is no pancreatic mass or inflammatory focus. Spleen: No splenic lesions are evident. Adrenals/Urinary Tract: Adrenals appear unremarkable bilaterally. Kidneys bilaterally show no evident mass or hydronephrosis on either side. There is no appreciable renal or ureteral calculus on either side. Urinary bladder contains a Foley catheter. There is air within the urinary bladder which may be due to instrumentation. There is calcification along the leftward posterior aspect of the urinary bladder. Note than a minimal amount of the anterior urinary bladder extends into a left inguinal hernia, less than on prior study. Stomach/Bowel: There is extensive pneumoperitoneum. A gastrostomy is positioned in the stomach. There are multiple loops of mildly dilated colon. No small bowel dilatation noted. No bowel wall thickening. No evident bowel obstruction. No portal venous air evident. Vascular/Lymphatic: There is atherosclerotic calcification in the aorta and iliac arteries. No aneurysm evident. Major mesenteric vessels appear patent on this noncontrast enhanced study. There are foci of mesenteric arterial calcification, particularly in the periphery of each renal artery. There is no adenopathy appreciable in the abdomen or pelvis. Reproductive: There are calcifications in the leftward aspect of the prostate. Prostate and seminal  vesicles appear normal in size and contour. No pelvic masses evident. Other: There is fat in each inguinal ring. Areas seen within the left inguinal hernia. There is no periappendiceal region inflammation. No abscess or ascites evident in the abdomen or pelvis. Musculoskeletal: There is  anterior wedging of the L2 vertebral body, stable. There is degenerative change in the lower thoracic and lumbar spine. No evident blastic or lytic bone lesions. No intramuscular or abdominal wall lesions. IMPRESSION: 1. Extensive pneumoperitoneum. Patient has had recent placement of gastrostomy catheter gastrostomy position within the stomach. This degree of pneumoperitoneum is concerning for viscus perforation, however. 2. No bowel obstruction evident. No abscess. No periappendiceal region inflammation. 3. Distended gallbladder with small gallstone and sludge. Gallbladder wall does not appear appreciably thickened. 4. Inguinal hernias bilaterally. Small amount of leftward aspect of urinary bladder extends into left inguinal hernia, less than on prior study. 5. Air within the urinary bladder may be due to instrumentation. There is calcification along the posterolateral aspect urinary bladder wall, likely of inflammatory etiology. 6. No hydronephrosis. No renal or ureteral calculus. There are prostatic calculi. 7.  Aortoiliac and scattered mesenteric arterial calcifications. 8.  Stable anterior wedging of the L2 vertebral body. 9. Bilateral pleural effusions with left base consolidation/atelectasis. 10.  Small pericardial effusion. Critical Value/emergent results were called by telephone at the time of interpretation on 10/07/2017 at 11:38 am to Dr. Merton Border , who verbally acknowledged these results. Electronically Signed   By: Lowella Grip III M.D.   On: 10/07/2017 11:38   Dg Abd Portable 1v  Result Date: 10/07/2017 CLINICAL DATA:  Small-bowel obstruction. EXAM: PORTABLE ABDOMEN - 1 VIEW COMPARISON:  10/05/2017.  CT 09/22/2017 FINDINGS: Gastrostomy tube in stable position in the left upper quadrant. Persistent distention of stomach, small bowel, and colon. Adynamic ileus may be present. Bowel obstruction including colonic cannot be excluded. Linear collection of air is noted a right upper quadrant.  Although this may represent air in the duodenum, air in the biliary system or free intraperitoneal air cannot be excluded. No subdiaphragmatic free air identified. Further evaluation of this patient with repeat CT of the abdomen and pelvis without oral contrast is suggested to evaluate for adynamic ileus, gallstone ileus, small or large bowel obstruction, and free intraperitoneal air. Degenerative changes lumbar spine. Prior median sternotomy. Left lower lobe atelectasis/consolidation. IMPRESSION: 1. Gastrostomy tube in stable position in the left upper quadrant. 2. Persistent distention of stomach, small bowel, and colon. Adynamic ileus may be present. Bowel obstruction including colonic obstruction cannot be excluded. Linear collection of air is in the right upper quadrant. Although this may represent air in the duodenum, air in the biliary system or free intraperitoneal air cannot be excluded. Further evaluation of this patient with repeat CT of the abdomen and pelvis without oral contrast is suggested to evaluate for adynamic ileus, gallstone ileus, small or large bowel obstruction, and free intraperitoneal air. 3.  Left lower lobe atelectasis/ consolidation. Critical Value/emergent results were called by telephone at the time of interpretation on 10/07/2017 at 9:17 am to Dr. Laren Everts, who verbally acknowledged these results. Electronically Signed   By: Marcello Moores  Register   On: 10/07/2017 09:19   Dg Abd Portable 1v  Result Date: 10/05/2017 CLINICAL DATA:  Ileus. EXAM: PORTABLE ABDOMEN - 1 VIEW COMPARISON:  Radiograph 09/26/2017 FINDINGS: Gastrostomy tube in the left upper quadrant. Gaseous gastric distension, similar to prior. Increased gaseous small bowel distention from prior exam common bowel loops measuring up to 4.6 cm. Previous  enteric tube is been removed. No convincing free air in the knees portable supine views. Left lung base consolidation appears similar to 09/27/2017 chest radiograph. IMPRESSION:  Progressive small bowel gaseous distention from prior exam with similar gaseous gastric distention, consistent with stated history of ileus. Cannot exclude developing small bowel obstruction radiographically. Electronically Signed   By: Jeb Levering M.D.   On: 10/05/2017 22:04    Review of Systems  Unable to perform ROS: Other  HENT:       On Trach collar at 20%  Gastrointestinal: Positive for abdominal pain, constipation (No BM for 3 days per nurse from [facility]) and diarrhea. Negative for vomiting.       G tube to straight drain  Skin:       He has bedsores head and buttocks  Neurological: Positive for weakness.  Endo/Heme/Allergies: Bruises/bleeds easily.   Blood pressure (!) 112/8, pulse 92, temperature 98.5 F (36.9 C), temperature source Oral, resp. rate (!) 23, SpO2 99 %. Physical Exam  Constitutional: He is oriented to person, place, and time.  Chronically ill male on Trach collar, and with significant abdominal discomfort on palpation  HENT:  Head: Normocephalic.  Mouth/Throat: No oropharyngeal exudate.  He has a dressing posterior portion of head for a bedsore.  We did not remove dressing  Eyes: Right eye exhibits no discharge. Left eye exhibits no discharge. No scleral icterus.  Pupils are equal  Neck:  Trach collar in place and did not remove  Cardiovascular: Normal rate, regular rhythm, normal heart sounds and intact distal pulses.   No murmur heard. Respiratory: Effort normal and breath sounds normal. No respiratory distress. He has no wheezes.  GI: Soft. He exhibits no mass. There is tenderness (extremely tender to any palpation). There is rebound and guarding.  G tube in place and looks fine.Marland Kitchen  Open and draining into foley bag  Genitourinary:  Genitourinary Comments: Foley in place  Musculoskeletal: He exhibits no edema or tenderness.  He has boots on , he has an area that is slow healing and slightly open lower leg venectomy site and the upper drain site has not  healed completely.  Neurological: He is alert and oriented to person, place, and time. No cranial nerve deficit.  Skin: Skin is warm and dry. No rash noted. No erythema. No pallor.  Psychiatric: He has a normal mood and affect. His behavior is normal. Judgment and thought content normal.  Pt following discussion with Dr. Ninfa Linden and clearly agreeing to surgery.      Assessment/Plan: 1.  Intraabdominal free air of uncertain etiology 2.  ST-elevation myocardial infarction, right ventricular infarct with cardiogenic shock, severe 3-vessel coronary artery disease, mild-moderate tricuspid regurgitation, right ventricular dysfunction following coronary artery bypass grafting requiring placement of right ventricular assist device 08/28/17/hospitalized 9/15-10/19/18. 3.  Removal of R-VENTRICULAR ASSIST DEVICE WITH CARDIOPULMONARY BYPASS 4.  Bedside tracheostomy/bronchoscopy 09/17/17 5. IR gastrostomy 10/01/17 6.  PAD 7. Malnutrition/deconditioning 8.  Acute respiratory failure/ distress  After MI and surgery - trached and  off ventilator 9.  Diabetes/diabetic neuropathy  Plan:  Reverse coumadin, Cefotetan, Medicine admit, OR for exploratory laparotomy ASAP.  FFP ordered stat.    Oakley Orban 10/07/2017, 1:51 PM

## 2017-10-07 NOTE — ED Notes (Signed)
Attempted to contact pt daughter Candi LeashGreta Wentz at [phone] with no answer. Left voice mail with callback number

## 2017-10-07 NOTE — ED Triage Notes (Addendum)
[facility] to be evaluated by general sx for bowel perf. Pt arrives with #6 trach after cabg x 4. Gtube placed recently and to gravity drainage Last BM 10/27. Surgeon at bedside

## 2017-10-07 NOTE — ED Notes (Signed)
Report given to short stay and pt transported on monitor with RN

## 2017-10-07 NOTE — Op Note (Signed)
EXPLORATORY LAPAROTOMY, REMOVAL AND REPLACEMENT OF GASTROSTOMY TUBE  Procedure Note  Clinton Harrington 10/07/2017   Pre-op Diagnosis: Free Air/Perforated Bowel     Post-op Diagnosis: Dislodged Gastrostomy tube  Procedure(s): EXPLORATORY LAPAROTOMY REMOVAL AND REPLACEMENT OF GASTROSTOMY TUBE  Surgeon(s): Clinton Harrington, Clinton Struss, MD Clinton Harrington, Burke, MD  Anesthesia: General  Staff:  Circulator: Clinton Harrington, Clinton A, RN Relief Circulator: Clinton Harrington, Clinton G, RN Relief Scrub: Clinton Harrington, Clinton Harrington Scrub Person: Clinton Harrington, Clinton C, RN; Clinton Harrington, Clinton Harrington Circulator Assistant: Clinton Harrington, Christelle, RN  Estimated Blood Loss: Minimal               Indications: This is Harrington 66 year old gentleman who had Harrington gastrostomy tube placed by interventional radiology approximately 6 days ago.  Because of worsening abdominal pain, Harrington CAT scan was performed showing pneumoperitoneum.  The decision was made to proceed to the operating room for evaluation  Findings: The gastrostomy tube was found to be in the stomach but the stomach had separated slightly from the abdominal wall.  The tube itself was just in the edge of the stomach and there were signs of necrosis around the Harrington-tube.  No other intra-abdominal pathology was identified.  Procedure: The patient was brought to the operating room and identified as the correct patient.  He was placed supine on the operating table and general anesthesia was induced.  His abdomen was then prepped and draped in usual sterile fashion.  I made an upper midline incision with Harrington scalpel.  I took this down through the fascia and peritoneum with the electrocautery.  Upon entering the abdomen, we evaluated the duodenum, small bowel, and colon.  These all appeared normal.  We then evaluate the gastrostomy tube.  There was some omentum stuck to the edge of the tube.  The stomach had pulled away from the peritoneal wall on one edge.  The tube itself had gone into the edge of the greater curve of the stomach  and there was started to be necrosis of the stomach at this point.  We thus elected to remove this gastrostomy tube.  The tube was removed completely.  We closed the opening in the stomach with several interrupted 2-0 silk sutures and then covered the repair with omentum which was then sutured in place.  I then identified the area closer to the center of the fundus of the stomach.  I placed Harrington 2-0 silk pursestring suture at this area.  We then brought Harrington 20 [place] gastrostomy tube onto the field.  We tested the balloon.  We then placed it through the previous skin incision in the left upper quadrant.  I made Harrington gastrotomy with the cautery at the center of the pursestring suture.  We then placed the gastrostomy tube through this.  The balloon was then insufflated.  We then tied the pursestring suture in place securing the gastrostomy tube into the stomach.  I then placed several 2-0 silk sutures around the tube of the stomach wall.  We then pulled the stomach and tube up to the peritoneal surface and secured the sutures in place fixating the stomach to the abdominal wall around the Harrington-tube.  We then flushed the Harrington-tube and good flush and return were demonstrated.  At this point we then closed the midline fascia with Harrington running #1 looped PDS suture.  The skin was irrigated and closed with skin staples.  The patient tolerated the procedure well.  All the counts were correct at the end the procedure.  The patient was then extubated  in the operating room and taken in Harrington stable condition to the recovery room.          Clinton Harrington   Date: 10/07/2017  Time: 3:33 PM

## 2017-10-07 NOTE — ED Provider Notes (Addendum)
[facility] EMERGENCY DEPARTMENT Provider Note   CSN: [id number] Arrival date & time: 10/07/17  1309     History   Chief Complaint No chief complaint on file.   HPI Clinton Harrington is a 66 y.o. male history diabetes, hypertension, recent CABG done by Dr. Prescott Gum here with pneumoperitonium.  [facility]  He had no bowel movement for the last several days and had a KUB today that showed free air.  Patient has CT abdomen pelvis today that also showed free air and has a G-tube that is in place.  Patient was transferred to the ED to see surgery and surgery is at bedside and plans to take him to the OR for laparotomy.   The history is provided by the patient and the EMS personnel.   Level V caveat-   Past Medical History:  Diagnosis Date  . Arthritis   . Diabetes ([place])   . Diabetes mellitus Jan. 1999  . High cholesterol   . Hypertension   . Myocardial infarct ([place])   . Peripheral vascular disease ([place])   . Seasonal allergies   . Smoker   . Urine incontinence     Patient Active Problem List   Diagnosis Date Noted  . Dysphagia   . Acute encephalopathy   . HCAP (healthcare-associated pneumonia)   . Acute respiratory failure with hypoxemia ([place])   . Acute pulmonary edema (HCC)   . CHF (congestive heart failure) ([place])   . Heart failure ([place]) 09/08/2017  . Pressure injury of skin 09/06/2017  . Respiratory failure ([place])   . STEMI (ST elevation myocardial infarction) ([place]) 08/23/2017  . Cardiogenic shock ([place])   . Diabetic polyneuropathy associated with diabetes mellitus due to underlying condition ([place]) 03/28/2016  . PAD (peripheral artery disease) ([place]) 01/23/2016  . Extremity atherosclerosis with gangrene ([place]) 01/01/2016  . Hx of CABG   . Dyslipidemia, goal LDL below 70   . Atherosclerosis of native arteries of the extremities with gangrene ([place]) 12/29/2015  . Ulcer of heel and midfoot ([place]) 11/09/2015  .  Diabetes ([place]) 10/30/2012  . Hyperlipemia 10/30/2012  . CAD (coronary artery disease) 10/30/2012  . HTN (hypertension) 10/30/2012  . Tobacco use 10/30/2012    Past Surgical History:  Procedure Laterality Date  . CORONARY ARTERY BYPASS GRAFT N/A 08/28/2017   Procedure: CORONARY ARTERY BYPASS GRAFTING (CABG) times four using left internal mammary artery and right greater saphenous leg vein using endoscope.;  Surgeon: Ivin Poot, MD;  Location: [facility];  Service: Open Heart Surgery;  Laterality: N/A;  . CORONARY STENT PLACEMENT  Jan.  1999  . FEMORAL-POPLITEAL BYPASS GRAFT Left 01/03/2016   Procedure: LEFT COMMON FEMORAL-BELOW KNEE POPLITEAL ARTERY BYPASS GRAFT USING NON REVERSED TRANSLOCATED SAPHENOUS VEIN GRAFT;  Surgeon: Mal Misty, MD;  Location: [facility];  Service: Vascular;  Laterality: Left;  . IABP INSERTION N/A 08/23/2017   Procedure: IABP Insertion;  Surgeon: Lorretta Harp, MD;  Location: [facility];  Service: Cardiovascular;  Laterality: N/A;  . INTRAOPERATIVE ARTERIOGRAM Left 01/03/2016   Procedure: INTRA OPERATIVE ARTERIOGRAM LEFT LEG;  Surgeon: Mal Misty, MD;  Location: [facility];  Service: Vascular;  Laterality: Left;  . IR GASTROSTOMY TUBE MOD SED  10/01/2017  . LEFT HEART CATH AND CORS/GRAFTS ANGIOGRAPHY N/A 08/23/2017   Procedure: LEFT HEART CATH AND CORS/GRAFTS ANGIOGRAPHY;  Surgeon: Lorretta Harp, MD;  Location: [facility];  Service: Cardiovascular;  Laterality:  N/A;  . PERIPHERAL VASCULAR CATHETERIZATION N/A 01/01/2016   Procedure: Abdominal Aortogram w/Lower Extremity;  Surgeon: Conrad Bowen, MD;  Location: [facility];  Service: Cardiovascular;  Laterality: N/A;  . PERIPHERAL VASCULAR CATHETERIZATION Left 01/01/2016   Procedure: Peripheral Vascular Balloon Angioplasty;  Surgeon: Conrad Fort Dick, MD;  Location: [facility];  Service: Cardiovascular;  Laterality: Left;  Popliteal artery  . PERIPHERAL VASCULAR CATHETERIZATION N/A 12/04/2016     Procedure: Abdominal Aortogram w/Lower Extremity;  Surgeon: Waynetta Sandy, MD;  Location: [facility];  Service: Cardiovascular;  Laterality: N/A;  . PERIPHERAL VASCULAR CATHETERIZATION Left 12/04/2016   Procedure: Peripheral Vascular Balloon Angioplasty;  Surgeon: Waynetta Sandy, MD;  Location: [facility];  Service: Cardiovascular;  Laterality: Left;  Fem_Pop bypass  . PLACEMENT OF CENTRIMAG VENTRICULAR ASSIST DEVICE Right 08/28/2017   Procedure: PLACEMENT OF CENTRIMAG VENTRICULAR ASSIST DEVICE;  Surgeon: Ivin Poot, MD;  Location: [facility];  Service: Open Heart Surgery;  Laterality: Right;  . REMOVAL OF CENTRIMAG VENTRICULAR ASSIST DEVICE N/A 09/08/2017   Procedure: REMOVAL OF R-VENTRICULAR ASSIST DEVICE WITH CARDIOPULMONARY BYPASS;  Surgeon: Ivin Poot, MD;  Location: [facility];  Service: Open Heart Surgery;  Laterality: N/A;  . TEE WITHOUT CARDIOVERSION N/A 08/28/2017   Procedure: TRANSESOPHAGEAL ECHOCARDIOGRAM (TEE);  Surgeon: Prescott Gum, Collier Salina, MD;  Location: [facility];  Service: Open Heart Surgery;  Laterality: N/A;  . TEE WITHOUT CARDIOVERSION N/A 09/08/2017   Procedure: TRANSESOPHAGEAL ECHOCARDIOGRAM (TEE);  Surgeon: Prescott Gum, Collier Salina, MD;  Location: [facility];  Service: Open Heart Surgery;  Laterality: N/A;       Home Medications    Prior to Admission medications   Medication Sig Start Date End Date Taking? Authorizing Provider  acetaminophen (TYLENOL) 650 MG CR tablet Take 1,300 mg by mouth 2 (two) times daily.    [provider]  Amino Acids-Protein Hydrolys (FEEDING SUPPLEMENT, PRO-STAT SUGAR FREE 64,) LIQD Place 30 mLs into feeding tube 3 (three) times daily. 09/26/17   Elgie Collard, PA-C  anidulafungin 100 mg in sodium chloride 0.9 % 100 mL Inject 100 mg into the vein daily. 09/27/17   Elgie Collard, PA-C  atorvastatin (LIPITOR) 80 MG tablet Place 1 tablet (80 mg total) into feeding tube daily at 6 PM. 09/26/17   Elgie Collard, PA-C   brimonidine (ALPHAGAN) 0.15 % ophthalmic solution Place 1 drop into the right eye 2 (two) times daily.  07/21/17   [provider]  budesonide (PULMICORT) 0.25 MG/2ML nebulizer solution Take 2 mLs (0.25 mg total) by nebulization 2 (two) times daily. 09/26/17   Elgie Collard, PA-C  Chlorhexidine Gluconate Cloth 2 % PADS Apply 6 each topically daily. 09/27/17   Elgie Collard, PA-C  chlorhexidine gluconate, MEDLINE KIT, (PERIDEX) 0.12 % solution 15 mLs by Mouth Rinse route 2 (two) times daily. 09/26/17   Elgie Collard, PA-C  collagenase (SANTYL) ointment Apply topically daily. 09/27/17   Elgie Collard, PA-C  fentaNYL (SUBLIMAZE) 100 MCG/2ML injection Inject 1 mL (50 mcg total) into the vein every 2 (two) hours as needed for moderate pain or severe pain. 09/26/17   Elgie Collard, PA-C  furosemide (LASIX) 10 MG/ML injection Inject 4 mLs (40 mg total) into the vein 2 (two) times daily. 09/26/17   Elgie Collard, PA-C  insulin aspart (NOVOLOG) 100 UNIT/ML injection Inject 0-24 Units into the skin every 4 (four) hours. 09/26/17   Elgie Collard, PA-C  insulin glargine (  LANTUS) 100 UNIT/ML injection Inject 0.3 mLs (30 Units total) into the skin 2 (two) times daily. 09/26/17   Elgie Collard, PA-C  ipratropium (ATROVENT) 0.02 % nebulizer solution Take 2.5 mLs (0.5 mg total) by nebulization every 6 (six) hours. 09/26/17   Elgie Collard, PA-C  lactobacillus (FLORANEX/LACTINEX) PACK Take 1 packet (1 g total) by mouth 3 (three) times daily with meals. 09/26/17   Elgie Collard, PA-C  levalbuterol (XOPENEX) 0.63 MG/3ML nebulizer solution Take 3 mLs (0.63 mg total) by nebulization every 6 (six) hours. 09/26/17   Elgie Collard, PA-C  meropenem 1 g in sodium chloride 0.9 % 100 mL Inject 1 g into the vein every 8 (eight) hours. 09/26/17 10/10/17  Elgie Collard, PA-C  metoprolol tartrate (LOPRESSOR) 5 MG/5ML SOLN injection Inject 2.5-5 mLs (2.5-5 mg total) into the vein every 2 (two) hours as needed (HR >  130 OR may use prn for SBP > 150 if HR > 70). 09/26/17   Elgie Collard, PA-C  midazolam (VERSED) 2 MG/2ML SOLN injection Inject 2 mLs (2 mg total) into the vein every 2 (two) hours as needed for agitation (to maintain RASS goal.). 09/26/17   Elgie Collard, PA-C  mouth rinse LIQD solution 15 mLs by Mouth Rinse route QID. 09/26/17   Elgie Collard, PA-C  mupirocin cream (BACTROBAN) 2 % Apply topically 2 (two) times daily. 09/26/17   Elgie Collard, PA-C  Nutritional Supplements (FEEDING SUPPLEMENT, VITAL 1.5 CAL,) LIQD Place 1,000 mLs into feeding tube continuous. 09/26/17   Elgie Collard, PA-C  ondansetron (ZOFRAN) 4 MG/2ML SOLN injection Inject 2 mLs (4 mg total) into the vein every 6 (six) hours as needed for nausea or vomiting. 09/26/17   Elgie Collard, PA-C  oxyCODONE (OXY IR/ROXICODONE) 5 MG immediate release tablet Take 1 tablet (5 mg total) by mouth every 3 (three) hours as needed for severe pain. 09/26/17   Elgie Collard, PA-C  pantoprazole sodium (PROTONIX) 40 mg/20 mL PACK Place 20 mLs (40 mg total) into feeding tube daily. 09/27/17   Elgie Collard, PA-C  potassium chloride 10 MEQ/50ML Inject 50 mLs (10 mEq total) into the vein every hour as needed (for K+ < 3.8). 09/26/17   Elgie Collard, PA-C  sodium chloride 0.9 % infusion Inject 10 mLs into the vein continuous. 09/26/17   Elgie Collard, PA-C  sodium chloride flush (NS) 0.9 % SOLN 10-40 mLs by Intracatheter route every 12 (twelve) hours. 09/26/17   Elgie Collard, PA-C  sodium chloride flush (NS) 0.9 % SOLN 10-40 mLs by Intracatheter route as needed (flush). 09/26/17   Elgie Collard, PA-C  sodium chloride flush (NS) 0.9 % SOLN Inject 3 mLs into the vein every 12 (twelve) hours. 09/26/17   Elgie Collard, PA-C  sodium chloride flush (NS) 0.9 % SOLN Inject 3 mLs into the vein as needed. 09/26/17   Elgie Collard, PA-C  spironolactone (ALDACTONE) 25 MG tablet Place 1 tablet (25 mg total) into feeding tube daily. 09/27/17   Elgie Collard, PA-C  vancomycin 500 mg in sodium chloride 0.9 % 100 mL Inject 500 mg into the vein every 12 (twelve) hours. 09/26/17   Elgie Collard, PA-C  warfarin (COUMADIN) 3 MG tablet Take 1 tablet (3 mg total) by mouth one time only at 6 PM. 09/26/17   Elgie Collard, PA-C  white petrolatum (VASELINE) OINT Apply 1 application topically 2 (two) times daily. 09/26/17  Elgie Collard, PA-C  Zinc Oxide (TRIPLE PASTE) 12.8 % ointment Apply topically as needed for irritation. 09/26/17   Elgie Collard, PA-C    Family History Family History  Problem Relation Age of Onset  . Arthritis Mother   . Hypertension Mother   . Heart disease Maternal Grandmother   . Mental illness Maternal Grandmother     Social History Social History  Substance Use Topics  . Smoking status: Current Every Day Smoker    Packs/day: 0.50    Years: 45.00    Types: Cigarettes  . Smokeless tobacco: Never Used     Comment: 1/2 pk per day  . Alcohol use No     Allergies   Aspirin; Latex; and Oxycodone-acetaminophen   Review of Systems Review of Systems  Gastrointestinal: Positive for abdominal pain and constipation.  All other systems reviewed and are negative.    Physical Exam Updated Vital Signs BP (!) 112/8   Pulse 92   Temp 98.5 F (36.9 C) (Oral)   Resp (!) 23   SpO2 99%   Physical Exam  Constitutional: He is oriented to person, place, and time.  Chronically ill   HENT:  Head: Normocephalic.  MM dry   Eyes: Pupils are equal, round, and reactive to light.  Neck: Normal range of motion.  Trach in place with trach collar   Cardiovascular: Normal rate.   Pulmonary/Chest: Effort normal.  Abdominal:  Distended, G tube and foley in place   Musculoskeletal: Normal range of motion.  Neurological: He is alert and oriented to person, place, and time.  Skin: Skin is warm.  Psychiatric:  Unable   Nursing note and vitals reviewed.    ED Treatments / Results  Labs (all labs ordered are listed,  but only abnormal results are displayed) Labs Reviewed  CBC WITH DIFFERENTIAL/PLATELET  PREPARE FRESH FROZEN PLASMA    EKG  EKG Interpretation None       Radiology Ct Abdomen Pelvis Wo Contrast  Result Date: 10/07/2017 CLINICAL DATA:  Severe abdominal pain EXAM: CT ABDOMEN AND PELVIS WITHOUT CONTRAST TECHNIQUE: Multidetector CT imaging of the abdomen and pelvis was performed following the standard protocol without oral or intravenous contrast maternal administration. COMPARISON:  September 22, 2017 FINDINGS: Lower chest: There are pleural effusions bilaterally with left base consolidation. There are multiple foci of coronary artery calcification. There is a small pericardial effusion. Hepatobiliary: No focal liver lesions are evident on this noncontrast enhanced study. There is sludge in a distended gallbladder. There is a gallstone present within the gallbladder. There is no appreciable gallbladder wall thickening. No biliary duct dilatation evident. Pancreas: There is no pancreatic mass or inflammatory focus. Spleen: No splenic lesions are evident. Adrenals/Urinary Tract: Adrenals appear unremarkable bilaterally. Kidneys bilaterally show no evident mass or hydronephrosis on either side. There is no appreciable renal or ureteral calculus on either side. Urinary bladder contains a Foley catheter. There is air within the urinary bladder which may be due to instrumentation. There is calcification along the leftward posterior aspect of the urinary bladder. Note than a minimal amount of the anterior urinary bladder extends into a left inguinal hernia, less than on prior study. Stomach/Bowel: There is extensive pneumoperitoneum. A gastrostomy is positioned in the stomach. There are multiple loops of mildly dilated colon. No small bowel dilatation noted. No bowel wall thickening. No evident bowel obstruction. No portal venous air evident. Vascular/Lymphatic: There is atherosclerotic calcification in the  aorta and iliac arteries. No aneurysm evident. Major mesenteric vessels  appear patent on this noncontrast enhanced study. There are foci of mesenteric arterial calcification, particularly in the periphery of each renal artery. There is no adenopathy appreciable in the abdomen or pelvis. Reproductive: There are calcifications in the leftward aspect of the prostate. Prostate and seminal vesicles appear normal in size and contour. No pelvic masses evident. Other: There is fat in each inguinal ring. Areas seen within the left inguinal hernia. There is no periappendiceal region inflammation. No abscess or ascites evident in the abdomen or pelvis. Musculoskeletal: There is anterior wedging of the L2 vertebral body, stable. There is degenerative change in the lower thoracic and lumbar spine. No evident blastic or lytic bone lesions. No intramuscular or abdominal wall lesions. IMPRESSION: 1. Extensive pneumoperitoneum. Patient has had recent placement of gastrostomy catheter gastrostomy position within the stomach. This degree of pneumoperitoneum is concerning for viscus perforation, however. 2. No bowel obstruction evident. No abscess. No periappendiceal region inflammation. 3. Distended gallbladder with small gallstone and sludge. Gallbladder wall does not appear appreciably thickened. 4. Inguinal hernias bilaterally. Small amount of leftward aspect of urinary bladder extends into left inguinal hernia, less than on prior study. 5. Air within the urinary bladder may be due to instrumentation. There is calcification along the posterolateral aspect urinary bladder wall, likely of inflammatory etiology. 6. No hydronephrosis. No renal or ureteral calculus. There are prostatic calculi. 7.  Aortoiliac and scattered mesenteric arterial calcifications. 8.  Stable anterior wedging of the L2 vertebral body. 9. Bilateral pleural effusions with left base consolidation/atelectasis. 10.  Small pericardial effusion. Critical  Value/emergent results were called by telephone at the time of interpretation on 10/07/2017 at 11:38 am to Dr. Merton Border , who verbally acknowledged these results. Electronically Signed   By: Lowella Grip III M.D.   On: 10/07/2017 11:38   Dg Abd Portable 1v  Result Date: 10/07/2017 CLINICAL DATA:  Small-bowel obstruction. EXAM: PORTABLE ABDOMEN - 1 VIEW COMPARISON:  10/05/2017.  CT 09/22/2017 FINDINGS: Gastrostomy tube in stable position in the left upper quadrant. Persistent distention of stomach, small bowel, and colon. Adynamic ileus may be present. Bowel obstruction including colonic cannot be excluded. Linear collection of air is noted a right upper quadrant. Although this may represent air in the duodenum, air in the biliary system or free intraperitoneal air cannot be excluded. No subdiaphragmatic free air identified. Further evaluation of this patient with repeat CT of the abdomen and pelvis without oral contrast is suggested to evaluate for adynamic ileus, gallstone ileus, small or large bowel obstruction, and free intraperitoneal air. Degenerative changes lumbar spine. Prior median sternotomy. Left lower lobe atelectasis/consolidation. IMPRESSION: 1. Gastrostomy tube in stable position in the left upper quadrant. 2. Persistent distention of stomach, small bowel, and colon. Adynamic ileus may be present. Bowel obstruction including colonic obstruction cannot be excluded. Linear collection of air is in the right upper quadrant. Although this may represent air in the duodenum, air in the biliary system or free intraperitoneal air cannot be excluded. Further evaluation of this patient with repeat CT of the abdomen and pelvis without oral contrast is suggested to evaluate for adynamic ileus, gallstone ileus, small or large bowel obstruction, and free intraperitoneal air. 3.  Left lower lobe atelectasis/ consolidation. Critical Value/emergent results were called by telephone at the time of  interpretation on 10/07/2017 at 9:17 am to Dr. Laren Everts, who verbally acknowledged these results. Electronically Signed   By: Marcello Moores  Register   On: 10/07/2017 09:19   Dg Abd Portable 1v  Result Date:  10/05/2017 CLINICAL DATA:  Ileus. EXAM: PORTABLE ABDOMEN - 1 VIEW COMPARISON:  Radiograph 09/26/2017 FINDINGS: Gastrostomy tube in the left upper quadrant. Gaseous gastric distension, similar to prior. Increased gaseous small bowel distention from prior exam common bowel loops measuring up to 4.6 cm. Previous enteric tube is been removed. No convincing free air in the knees portable supine views. Left lung base consolidation appears similar to 09/27/2017 chest radiograph. IMPRESSION: Progressive small bowel gaseous distention from prior exam with similar gaseous gastric distention, consistent with stated history of ileus. Cannot exclude developing small bowel obstruction radiographically. Electronically Signed   By: Jeb Levering M.D.   On: 10/05/2017 22:04    Procedures Procedures (including critical care time)  CRITICAL CARE Performed by: Wandra Arthurs   Total critical care time: 30 minutes  Critical care time was exclusive of separately billable procedures and treating other patients.  Critical care was necessary to treat or prevent imminent or life-threatening deterioration.  Critical care was time spent personally by me on the following activities: development of treatment plan with patient and/or surrogate as well as nursing, discussions with consultants, evaluation of patient's response to treatment, examination of patient, obtaining history from patient or surrogate, ordering and performing treatments and interventions, ordering and review of laboratory studies, ordering and review of radiographic studies, pulse oximetry and re-evaluation of patient's condition.   Medications Ordered in ED Medications  0.9 %  sodium chloride infusion (not administered)  cefoTEtan (CEFOTAN) 2 g in  dextrose 5 % 50 mL IVPB (not administered)     Initial Impression / Assessment and Plan / ED Course  I have reviewed the triage vital signs and the nursing notes.  Pertinent labs & imaging results that were available during my care of the patient were reviewed by me and considered in my medical decision making (see chart for details).     HENERY BETZOLD is a 66 y.o. male here with abdominal pain. CT showed free air. Surgery at bedside and ordered FFP since patient on coumadin. They will take patient emergently to OR. Request medicine admission. Labs this AM done at [facility] reviewed and is stable. Hospitalist to admit.   1:55 PM Talked to hospitalist. He has a trach. He may be on a vent after surgery depending on his condition. Will consult critical care to see as well.    Final Clinical Impressions(s) / ED Diagnoses   Final diagnoses:  Pneumoperitoneum    New Prescriptions New Prescriptions   No medications on file     Drenda Freeze, MD 10/07/17 1344    Drenda Freeze, MD 10/07/17 1355

## 2017-10-07 NOTE — Anesthesia Preprocedure Evaluation (Signed)
Anesthesia Evaluation  Patient identified by MRN, date of birth, ID band Patient awake    Reviewed: Allergy & Precautions, NPO status , Patient's Chart, lab work & pertinent test resultsPreop documentation limited or incomplete due to emergent nature of procedure.  Airway        Dental   Pulmonary Current Smoker,           Cardiovascular hypertension, Pt. 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   Neuro/Psych  Neuromuscular disease    GI/Hepatic   Endo/Other  diabetes, Type 2, Insulin Dependent  Renal/GU      Musculoskeletal  (+) Arthritis ,   Abdominal   Peds  Hematology   Anesthesia Other Findings   Reproductive/Obstetrics                             Anesthesia Physical  Anesthesia Plan  ASA: IV and emergent  Anesthesia Plan: General   Post-op Pain Management:    Induction: Intravenous and Rapid sequence  PONV Risk Score and Plan: 1 and Ondansetron and Dexamethasone  Airway Management Planned: Oral ETT  Additional Equipment:   Intra-op Plan:   Post-operative Plan: Post-operative intubation/ventilation and Possible Post-op intubation/ventilation  Informed Consent: I have reviewed the patients History and Physical, chart, labs and discussed the procedure including the risks, benefits and alternatives for the proposed anesthesia with the patient or authorized representative who has indicated his/her understanding and acceptance.     Plan Discussed with: CRNA and Surgeon  Anesthesia Plan Comments:         Anesthesia Quick Evaluation

## 2017-10-07 NOTE — H&P (Signed)
History and Physical    Clinton Harrington WRU:[id number] DOB: [birth year] DOA: 10/07/2017   PCP: Shirline Frees, MD   Patient coming from:  Home    Chief Complaint: severe constipation, abdominal distention   HPI: Clinton Harrington is a 66 y.o. male with very complex medical history, including diabetes, hypertension, hyperlipidemia, COPD, transferred from [facility] due to bowel obstruction, for OR today . He had a recent STEMI, status post CABG for three-vessel coronary artery disease and placement of right ventricular assist device on 08/28/2017. done by Dr. Nils Pyle, complicated by pneumothorax. [facility] Prior to that he was [facility] from 9/15 until 10/19 Over the last several days, the patient had no bowel movements, which prompted a KUB, which showed today free air. CT of the abdomen and pelvis also confirmed free air. G-tube is in place (performed on 10/24). Was transferred to the emergency department, and was evaluated by general surgery, who plans to take him to the OR for laparotomy today.Patient has a trach, unable to indicate in conversation, but he is alert and oriented, and able to follow, and. He states that the main area of pain is his abdomen, he denies any chest pain or palpitations. He denies any shortness of breath or cough. He denies any nausea or vomiting. He denies any lower extremity swelling, and he is able to urinate without pain. No bleeding issues noted.    ED Course:  BP 120/77   Pulse 92   Temp 98.4 F (36.9 C) (Oral)   Resp 20   SpO2 97%   FFP  was given, in preparation for OR. He also was given Cefotan 2 g IV  CT A/P pneumoperitoneum concerning for viscus perforation.  No bowel obstruction or abscess was evident.  No periappendiceal regional inflammation.  Gallbladder was distended with small stones.  Inguinal hernias bilaterally. Dr. Ninfa Linden, Gen Surg, discussed with the patient today findings,  recommended immediate OR. Patient is aware of the risks and benefits and wished to proceed. He is full code.   Update : 1700 Laparoscopy was performed, showing   " The gastrostomy tube was found to be in the stomach but the stomach had separated slightly from the abdominal wall.  The tube itself was just in the edge of the stomach and there were signs of necrosis around the G-tube.  No other intra-abdominal pathology was identified."  His G tube was replaced   Labs on Admission: I have personally reviewed following labs and imaging studies Hemoglobin 10.4, baseline Sodium 133 Glucose 105 Creatinine 1.02 INR 2.58 before FFP. Coumadin was reversed  Tn and EKG pending    Review of Systems:  As per HPI otherwise all other systems reviewed and are negative  Past Medical History:  Diagnosis Date  . Arthritis   . Diabetes ([place])   . Diabetes mellitus Jan. 1999  . High cholesterol   . Hypertension   . Myocardial infarct ([place])   . Peripheral vascular disease ([place])   . Seasonal allergies   . Smoker   . Urine incontinence     Past Surgical History:  Procedure Laterality Date  . CORONARY ARTERY BYPASS GRAFT N/A 08/28/2017   Procedure: CORONARY ARTERY BYPASS GRAFTING (CABG) times four using left internal mammary artery and right greater saphenous leg vein using endoscope.;  Surgeon: Ivin Poot, MD;  Location: [place];  Service: Open Heart Surgery;  Laterality: N/A;  . CORONARY STENT PLACEMENT  Jan.  1999  . FEMORAL-POPLITEAL BYPASS GRAFT Left 01/03/2016   Procedure: LEFT COMMON FEMORAL-BELOW KNEE POPLITEAL ARTERY BYPASS GRAFT USING NON REVERSED TRANSLOCATED SAPHENOUS VEIN GRAFT;  Surgeon: Mal Misty, MD;  Location: [place];  Service: Vascular;  Laterality: Left;  . IABP INSERTION N/A 08/23/2017   Procedure: IABP Insertion;  Surgeon: Lorretta Harp, MD;  Location: [place] [facility];  Service: Cardiovascular;  Laterality: N/A;  . INTRAOPERATIVE ARTERIOGRAM Left 01/03/2016   Procedure:  INTRA OPERATIVE ARTERIOGRAM LEFT LEG;  Surgeon: Mal Misty, MD;  Location: [place];  Service: Vascular;  Laterality: Left;  . IR GASTROSTOMY TUBE MOD SED  10/01/2017  . LEFT HEART CATH AND CORS/GRAFTS ANGIOGRAPHY N/A 08/23/2017   Procedure: LEFT HEART CATH AND CORS/GRAFTS ANGIOGRAPHY;  Surgeon: Lorretta Harp, MD;  Location: [place] [facility];  Service: Cardiovascular;  Laterality: N/A;  . PERIPHERAL VASCULAR CATHETERIZATION N/A 01/01/2016   Procedure: Abdominal Aortogram w/Lower Extremity;  Surgeon: Conrad [place], MD;  Location: [place] [facility];  Service: Cardiovascular;  Laterality: N/A;  . PERIPHERAL VASCULAR CATHETERIZATION Left 01/01/2016   Procedure: Peripheral Vascular Balloon Angioplasty;  Surgeon: Conrad [place], MD;  Location: [place] [facility];  Service: Cardiovascular;  Laterality: Left;  Popliteal artery  . PERIPHERAL VASCULAR CATHETERIZATION N/A 12/04/2016   Procedure: Abdominal Aortogram w/Lower Extremity;  Surgeon: Waynetta Sandy, MD;  Location: [place] [facility];  Service: Cardiovascular;  Laterality: N/A;  . PERIPHERAL VASCULAR CATHETERIZATION Left 12/04/2016   Procedure: Peripheral Vascular Balloon Angioplasty;  Surgeon: Waynetta Sandy, MD;  Location: [place] [facility];  Service: Cardiovascular;  Laterality: Left;  Fem_Pop bypass  . PLACEMENT OF CENTRIMAG VENTRICULAR ASSIST DEVICE Right 08/28/2017   Procedure: PLACEMENT OF CENTRIMAG VENTRICULAR ASSIST DEVICE;  Surgeon: Ivin Poot, MD;  Location: [place];  Service: Open Heart Surgery;  Laterality: Right;  . REMOVAL OF CENTRIMAG VENTRICULAR ASSIST DEVICE N/A 09/08/2017   Procedure: REMOVAL OF R-VENTRICULAR ASSIST DEVICE WITH CARDIOPULMONARY BYPASS;  Surgeon: Ivin Poot, MD;  Location: [place];  Service: Open Heart Surgery;  Laterality: N/A;  . TEE WITHOUT CARDIOVERSION N/A 08/28/2017   Procedure: TRANSESOPHAGEAL ECHOCARDIOGRAM (TEE);  Surgeon: Prescott Gum, Collier [place], MD;  Location: [place];  Service: Open  Heart Surgery;  Laterality: N/A;  . TEE WITHOUT CARDIOVERSION N/A 09/08/2017   Procedure: TRANSESOPHAGEAL ECHOCARDIOGRAM (TEE);  Surgeon: Prescott Gum, Collier [place]ina, MD;  Location: [place];  Service: Open Heart Surgery;  Laterality: N/A;    Social History Social History   Social History  . Marital status: Divorced    Spouse name: N/A  . Number of children: N/A  . Years of education: N/A   Occupational History  . Not on file.   Social History Main Topics  . Smoking status: Current Every Day Smoker    Packs/day: 0.50    Years: 45.00    Types: Cigarettes  . Smokeless tobacco: Never Used     Comment: 1/2 pk per day  . Alcohol use No  . Drug use: No  . Sexual activity: Not on file   Other Topics Concern  . Not on file   Social History Narrative   Disability since 2015.  Previously worked at [employer] for 10 years.    He lives at home with mother with dementia.   He has one daughter.      Allergies  Allergen Reactions  . Aspirin Other (See Comments)    Stomach upset  . Latex Rash  . Oxycodone-Acetaminophen  Itching    Family History  Problem Relation Age of Onset  . Arthritis Mother   . Hypertension Mother   . Heart disease Maternal Grandmother   . Mental illness Maternal Grandmother       Prior to Admission medications   Medication Sig Start Date End Date Taking? Authorizing Provider  acetaminophen (TYLENOL) 650 MG CR tablet Take 1,300 mg by mouth 2 (two) times daily.    [provider]  Amino Acids-Protein Hydrolys (FEEDING SUPPLEMENT, PRO-STAT SUGAR FREE 64,) LIQD Place 30 mLs into feeding tube 3 (three) times daily. 09/26/17   Elgie Collard, PA-C  anidulafungin 100 mg in sodium chloride 0.9 % 100 mL Inject 100 mg into the vein daily. 09/27/17   Elgie Collard, PA-C  atorvastatin (LIPITOR) 80 MG tablet Place 1 tablet (80 mg total) into feeding tube daily at 6 PM. 09/26/17   Elgie Collard, PA-C  brimonidine (ALPHAGAN) 0.15 % ophthalmic solution Place 1 drop  into the right eye 2 (two) times daily.  07/21/17   [provider]  budesonide (PULMICORT) 0.25 MG/2ML nebulizer solution Take 2 mLs (0.25 mg total) by nebulization 2 (two) times daily. 09/26/17   Elgie Collard, PA-C  Chlorhexidine Gluconate Cloth 2 % PADS Apply 6 each topically daily. 09/27/17   Elgie Collard, PA-C  chlorhexidine gluconate, MEDLINE KIT, (PERIDEX) 0.12 % solution 15 mLs by Mouth Rinse route 2 (two) times daily. 09/26/17   Elgie Collard, PA-C  collagenase (SANTYL) ointment Apply topically daily. 09/27/17   Elgie Collard, PA-C  fentaNYL (SUBLIMAZE) 100 MCG/2ML injection Inject 1 mL (50 mcg total) into the vein every 2 (two) hours as needed for moderate pain or severe pain. 09/26/17   Elgie Collard, PA-C  furosemide (LASIX) 10 MG/ML injection Inject 4 mLs (40 mg total) into the vein 2 (two) times daily. 09/26/17   Elgie Collard, PA-C  insulin aspart (NOVOLOG) 100 UNIT/ML injection Inject 0-24 Units into the skin every 4 (four) hours. 09/26/17   Elgie Collard, PA-C  insulin glargine (LANTUS) 100 UNIT/ML injection Inject 0.3 mLs (30 Units total) into the skin 2 (two) times daily. 09/26/17   Elgie Collard, PA-C  ipratropium (ATROVENT) 0.02 % nebulizer solution Take 2.5 mLs (0.5 mg total) by nebulization every 6 (six) hours. 09/26/17   Elgie Collard, PA-C  lactobacillus (FLORANEX/LACTINEX) PACK Take 1 packet (1 g total) by mouth 3 (three) times daily with meals. 09/26/17   Elgie Collard, PA-C  levalbuterol (XOPENEX) 0.63 MG/3ML nebulizer solution Take 3 mLs (0.63 mg total) by nebulization every 6 (six) hours. 09/26/17   Elgie Collard, PA-C  meropenem 1 g in sodium chloride 0.9 % 100 mL Inject 1 g into the vein every 8 (eight) hours. 09/26/17 10/10/17  Elgie Collard, PA-C  metoprolol tartrate (LOPRESSOR) 5 MG/5ML SOLN injection Inject 2.5-5 mLs (2.5-5 mg total) into the vein every 2 (two) hours as needed (HR > 130 OR may use prn for SBP > 150 if HR > 70). 09/26/17   Elgie Collard, PA-C  midazolam (VERSED) 2 MG/2ML SOLN injection Inject 2 mLs (2 mg total) into the vein every 2 (two) hours as needed for agitation (to maintain RASS goal.). 09/26/17   Elgie Collard, PA-C  mouth rinse LIQD solution 15 mLs by Mouth Rinse route QID. 09/26/17   Elgie Collard, PA-C  mupirocin cream (BACTROBAN) 2 % Apply topically 2 (two) times daily. 09/26/17   Harriet Pho,  Tessa N, PA-C  Nutritional Supplements (FEEDING SUPPLEMENT, VITAL 1.5 CAL,) LIQD Place 1,000 mLs into feeding tube continuous. 09/26/17   Elgie Collard, PA-C  ondansetron (ZOFRAN) 4 MG/2ML SOLN injection Inject 2 mLs (4 mg total) into the vein every 6 (six) hours as needed for nausea or vomiting. 09/26/17   Elgie Collard, PA-C  oxyCODONE (OXY IR/ROXICODONE) 5 MG immediate release tablet Take 1 tablet (5 mg total) by mouth every 3 (three) hours as needed for severe pain. 09/26/17   Elgie Collard, PA-C  pantoprazole sodium (PROTONIX) 40 mg/20 mL PACK Place 20 mLs (40 mg total) into feeding tube daily. 09/27/17   Elgie Collard, PA-C  potassium chloride 10 MEQ/50ML Inject 50 mLs (10 mEq total) into the vein every hour as needed (for K+ < 3.8). 09/26/17   Elgie Collard, PA-C  sodium chloride 0.9 % infusion Inject 10 mLs into the vein continuous. 09/26/17   Elgie Collard, PA-C  sodium chloride flush (NS) 0.9 % SOLN 10-40 mLs by Intracatheter route every 12 (twelve) hours. 09/26/17   Elgie Collard, PA-C  sodium chloride flush (NS) 0.9 % SOLN 10-40 mLs by Intracatheter route as needed (flush). 09/26/17   Elgie Collard, PA-C  sodium chloride flush (NS) 0.9 % SOLN Inject 3 mLs into the vein every 12 (twelve) hours. 09/26/17   Elgie Collard, PA-C  sodium chloride flush (NS) 0.9 % SOLN Inject 3 mLs into the vein as needed. 09/26/17   Elgie Collard, PA-C  spironolactone (ALDACTONE) 25 MG tablet Place 1 tablet (25 mg total) into feeding tube daily. 09/27/17   Elgie Collard, PA-C  vancomycin 500 mg in sodium chloride 0.9 % 100 mL  Inject 500 mg into the vein every 12 (twelve) hours. 09/26/17   Elgie Collard, PA-C  warfarin (COUMADIN) 3 MG tablet Take 1 tablet (3 mg total) by mouth one time only at 6 PM. 09/26/17   Elgie Collard, PA-C  white petrolatum (VASELINE) OINT Apply 1 application topically 2 (two) times daily. 09/26/17   Elgie Collard, PA-C  Zinc Oxide (TRIPLE PASTE) 12.8 % ointment Apply topically as needed for irritation. 09/26/17   Elgie Collard, PA-C    Physical Exam:  Vitals:   10/07/17 1314 10/07/17 1330 10/07/17 1345 10/07/17 1413  BP: (!) 112/8 107/76 116/73 120/77  Pulse: 92 92 92 92  Resp: (!) 23 (!) 23 (!) 22 20  Temp: 98.5 F (36.9 C)   98.4 F (36.9 C)  TempSrc: Oral   Oral  SpO2: 99% 100% 100% 97%   Constitutional: NAD, calm, ill appearing  Eyes: PERRL, lids and conjunctivae normal ENMT: Mucous membranes are moist, without exudate or lesions.   Neck: normal, supple, no masses, no thyromegaly Trach in place, without erythema or thick secretions Respiratory: clear to auscultation bilaterally, no wheezing, no crackles. Normal respiratory effort  Cardiovascular: Regular rate and rhythm,  murmur, rubs or gallops. No extremity edema. 2+ pedal pulses. No carotid bruits.  Abdomen: Soft, tender to palpation in the lower quadrants. He also has distention of the stomach., G-tube is in place. Tender to palpation . No hepatosplenomegaly. Bowel sounds positive. Foley in place  Musculoskeletal: no clubbing / cyanosis. Moves all extremities Skin: no jaundice, besore in the occipital area  Neurologic: Sensation intact  Strength equal in all extremities Psychiatric:   Alert and oriented x 3. Flat affect    Labs on Admission: I have personally reviewed following labs and imaging  studies  CBC:  Recent Labs Lab 10/03/17 0645 10/04/17 0725 10/05/17 0513 10/06/17 0728 10/07/17 1345  WBC 8.0 7.4 8.1 8.6 8.2  NEUTROABS  --   --   --   --  6.3  HGB 9.7* 9.3* 10.1* 10.3* 10.4*  HCT 30.2* 29.7*  32.3* 32.6* 32.5*  MCV 89.9 89.5 89.2 88.6 88.6  PLT 310 318 355 382 297    Basic Metabolic Panel:  Recent Labs Lab 10/03/17 0645 10/04/17 0725 10/05/17 0513 10/06/17 0728 10/07/17 0638  NA 132* 128* 133* 131* 133*  K 4.5 5.3* 3.9 4.1 3.8  CL 102 97* 97* 97* 99*  CO2 21* '23 26 25 24  '$ GLUCOSE 150* 260* 97 123* 105*  BUN 30* 33* 36* 46* 41*  CREATININE 1.13 1.03 1.01 1.17 1.02  CALCIUM 8.3* 8.2* 8.3* 8.3* 8.2*    GFR: Estimated Creatinine Clearance: 85.8 mL/min (by C-G formula based on SCr of 1.02 mg/dL).  Liver Function Tests:  Recent Labs Lab 10/02/17 0514 10/03/17 0645 10/04/17 0725 10/05/17 0513 10/06/17 0728  AST 51* 43* 40 71* 50*  ALT 49 42 40 59 48  ALKPHOS 119 125 122 134* 134*  BILITOT 0.9 0.7 0.6 0.6 0.7  PROT 6.8 7.3 7.0 7.4 7.5  ALBUMIN 1.9* 2.0* 1.9* 2.0* 2.2*   No results for input(s): LIPASE, AMYLASE in the last 168 hours. No results for input(s): AMMONIA in the last 168 hours.  Coagulation Profile:  Recent Labs Lab 10/03/17 0645 10/04/17 0725 10/05/17 0513 10/06/17 0728 10/07/17 0638  INR 2.43 3.29 2.03 1.95 2.58    Cardiac Enzymes: No results for input(s): CKTOTAL, CKMB, CKMBINDEX, TROPONINI in the last 168 hours.  BNP (last 3 results) No results for input(s): PROBNP in the last 8760 hours.  HbA1C: No results for input(s): HGBA1C in the last 72 hours.  CBG: No results for input(s): GLUCAP in the last 168 hours.  Lipid Profile: No results for input(s): CHOL, HDL, LDLCALC, TRIG, CHOLHDL, LDLDIRECT in the last 72 hours.  Thyroid Function Tests: No results for input(s): TSH, T4TOTAL, FREET4, T3FREE, THYROIDAB in the last 72 hours.  Anemia Panel: No results for input(s): VITAMINB12, FOLATE, FERRITIN, TIBC, IRON, RETICCTPCT in the last 72 hours.  Urine analysis:    Component Value Date/Time   COLORURINE YELLOW 09/18/2017 1140   APPEARANCEUR CLEAR 09/18/2017 1140   LABSPEC 1.010 09/18/2017 1140   PHURINE 6.0 09/18/2017 1140    GLUCOSEU NEGATIVE 09/18/2017 1140   HGBUR LARGE (A) 09/18/2017 1140   BILIRUBINUR NEGATIVE 09/18/2017 [place] 09/18/2017 1140   PROTEINUR NEGATIVE 09/18/2017 1140   NITRITE NEGATIVE 09/18/2017 1140   LEUKOCYTESUR TRACE (A) 09/18/2017 1140    Sepsis Labs: '@LABRCNTIP'$ (procalcitonin:4,lacticidven:4) )No results found for this or any previous visit (from the past 240 hour(s)).      Sepsis Labs: '@LABRCNTIP'$ (procalcitonin:4,lacticidven:4) )No results found for this or any previous visit (from the past 240 hour(s)).   Radiological Exams on Admission: Ct Abdomen Pelvis Wo Contrast  Result Date: 10/07/2017 CLINICAL DATA:  Severe abdominal pain EXAM: CT ABDOMEN AND PELVIS WITHOUT CONTRAST TECHNIQUE: Multidetector CT imaging of the abdomen and pelvis was performed following the standard protocol without oral or intravenous contrast maternal administration. COMPARISON:  September 22, 2017 FINDINGS: Lower chest: There are pleural effusions bilaterally with left base consolidation. There are multiple foci of coronary artery calcification. There is a small pericardial effusion. Hepatobiliary: No focal liver lesions are evident on this noncontrast enhanced study. There is sludge in a distended gallbladder.  There is a gallstone present within the gallbladder. There is no appreciable gallbladder wall thickening. No biliary duct dilatation evident. Pancreas: There is no pancreatic mass or inflammatory focus. Spleen: No splenic lesions are evident. Adrenals/Urinary Tract: Adrenals appear unremarkable bilaterally. Kidneys bilaterally show no evident mass or hydronephrosis on either side. There is no appreciable renal or ureteral calculus on either side. Urinary bladder contains a Foley catheter. There is air within the urinary bladder which may be due to instrumentation. There is calcification along the leftward posterior aspect of the urinary bladder. Note than a minimal amount of the anterior  urinary bladder extends into a left inguinal hernia, less than on prior study. Stomach/Bowel: There is extensive pneumoperitoneum. A gastrostomy is positioned in the stomach. There are multiple loops of mildly dilated colon. No small bowel dilatation noted. No bowel wall thickening. No evident bowel obstruction. No portal venous air evident. Vascular/Lymphatic: There is atherosclerotic calcification in the aorta and iliac arteries. No aneurysm evident. Major mesenteric vessels appear patent on this noncontrast enhanced study. There are foci of mesenteric arterial calcification, particularly in the periphery of each renal artery. There is no adenopathy appreciable in the abdomen or pelvis. Reproductive: There are calcifications in the leftward aspect of the prostate. Prostate and seminal vesicles appear normal in size and contour. No pelvic masses evident. Other: There is fat in each inguinal ring. Areas seen within the left inguinal hernia. There is no periappendiceal region inflammation. No abscess or ascites evident in the abdomen or pelvis. Musculoskeletal: There is anterior wedging of the L2 vertebral body, stable. There is degenerative change in the lower thoracic and lumbar spine. No evident blastic or lytic bone lesions. No intramuscular or abdominal wall lesions. IMPRESSION: 1. Extensive pneumoperitoneum. Patient has had recent placement of gastrostomy catheter gastrostomy position within the stomach. This degree of pneumoperitoneum is concerning for viscus perforation, however. 2. No bowel obstruction evident. No abscess. No periappendiceal region inflammation. 3. Distended gallbladder with small gallstone and sludge. Gallbladder wall does not appear appreciably thickened. 4. Inguinal hernias bilaterally. Small amount of leftward aspect of urinary bladder extends into left inguinal hernia, less than on prior study. 5. Air within the urinary bladder may be due to instrumentation. There is calcification along  the posterolateral aspect urinary bladder wall, likely of inflammatory etiology. 6. No hydronephrosis. No renal or ureteral calculus. There are prostatic calculi. 7.  Aortoiliac and scattered mesenteric arterial calcifications. 8.  Stable anterior wedging of the L2 vertebral body. 9. Bilateral pleural effusions with left base consolidation/atelectasis. 10.  Small pericardial effusion. Critical Value/emergent results were called by telephone at the time of interpretation on 10/07/2017 at 11:38 am to Dr. Merton Border , who verbally acknowledged these results. Electronically Signed   By: Lowella Grip III M.D.   On: 10/07/2017 11:38   Dg Abd Portable 1v  Result Date: 10/07/2017 CLINICAL DATA:  Small-bowel obstruction. EXAM: PORTABLE ABDOMEN - 1 VIEW COMPARISON:  10/05/2017.  CT 09/22/2017 FINDINGS: Gastrostomy tube in stable position in the left upper quadrant. Persistent distention of stomach, small bowel, and colon. Adynamic ileus may be present. Bowel obstruction including colonic cannot be excluded. Linear collection of air is noted a right upper quadrant. Although this may represent air in the duodenum, air in the biliary system or free intraperitoneal air cannot be excluded. No subdiaphragmatic free air identified. Further evaluation of this patient with repeat CT of the abdomen and pelvis without oral contrast is suggested to evaluate for adynamic ileus, gallstone ileus, small or  large bowel obstruction, and free intraperitoneal air. Degenerative changes lumbar spine. Prior median sternotomy. Left lower lobe atelectasis/consolidation. IMPRESSION: 1. Gastrostomy tube in stable position in the left upper quadrant. 2. Persistent distention of stomach, small bowel, and colon. Adynamic ileus may be present. Bowel obstruction including colonic obstruction cannot be excluded. Linear collection of air is in the right upper quadrant. Although this may represent air in the duodenum, air in the biliary system or  free intraperitoneal air cannot be excluded. Further evaluation of this patient with repeat CT of the abdomen and pelvis without oral contrast is suggested to evaluate for adynamic ileus, gallstone ileus, small or large bowel obstruction, and free intraperitoneal air. 3.  Left lower lobe atelectasis/ consolidation. Critical Value/emergent results were called by telephone at the time of interpretation on 10/07/2017 at 9:17 am to Dr. Laren Everts, who verbally acknowledged these results. Electronically Signed   By: Marcello Moores  Register   On: 10/07/2017 09:19   Dg Abd Portable 1v  Result Date: 10/05/2017 CLINICAL DATA:  Ileus. EXAM: PORTABLE ABDOMEN - 1 VIEW COMPARISON:  Radiograph 09/26/2017 FINDINGS: Gastrostomy tube in the left upper quadrant. Gaseous gastric distension, similar to prior. Increased gaseous small bowel distention from prior exam common bowel loops measuring up to 4.6 cm. Previous enteric tube is been removed. No convincing free air in the knees portable supine views. Left lung base consolidation appears similar to 09/27/2017 chest radiograph. IMPRESSION: Progressive small bowel gaseous distention from prior exam with similar gaseous gastric distention, consistent with stated history of ileus. Cannot exclude developing small bowel obstruction radiographically. Electronically Signed   By: Jeb Levering M.D.   On: 10/05/2017 22:04    EKG: Independently reviewed.  Assessment/Plan Active Problems:   Diabetes ([place])   Hyperlipemia   CAD (coronary artery disease)   HTN (hypertension)   Tobacco use   PAD (peripheral artery disease) (HCC)   Diabetic polyneuropathy associated with diabetes mellitus due to underlying condition (HCC)   STEMI (ST elevation myocardial infarction) (HCC)   Cardiogenic shock (HCC)   Respiratory failure (HCC)   Pressure injury of skin   CHF (congestive heart failure) (HCC)   Dysphagia   Bowel obstruction (HCC)   Gastrojejunostomy tube dislodgement (HCC)   Abdominal  pain and distention secondary to air in the setting of dislodgement  of the G tube with replacement  , per laparotomy by Dr. Ninfa Linden, Gen Surg  . G tube is now functioning well    Admit to SDU  IVF  prn Zofran  IV pain meds  Appreciate SUrgery involvement   CAD, s/p CABG, p STEMI  and cardiogenic shock,   EKG,  Tn are pending patient is cardiac pain free at this time. Continue meds and may need Cards involvement if patient develops any cardiac complaints   Chronic respiratory failure, on trach, functioning well Continue trach care  Respiratory therapy   Type II Diabetes Current blood sugar level is 105  Lab Results  Component Value Date   HGBA1C 5.9 (H) 08/25/2017   SSI  Chronic kidney disease stage  3   baseline creatinine 1, no changes   Lab Results  Component Value Date   CREATININE 1.02 10/07/2017   CREATININE 1.17 10/06/2017   CREATININE 1.01 10/05/2017     Repeat CMET in am   Anemia of chronic disease Hemoglobin on admission 1, at baseline       Repeat CBC in am  No transfusion is indicated at this time   COPD without exacerbation patient on  Trach  Continue nebs and trach care  CXR in am  Respiratory care    DVT prophylaxis:  SCDs in view of recent surgery  Code Status:    Full  Family Communication:  Discussed with patient Disposition Plan: Expect patient to be discharged to home after condition improves Consults called:    Gen Surg Dr. Ninfa Linden  Admission status: SDU IP    Rondel Jumbo, PA-C [facility]   10/07/2017, 4:52 PM

## 2017-10-08 ENCOUNTER — Encounter (HOSPITAL_COMMUNITY): Payer: Self-pay | Admitting: Surgery

## 2017-10-08 ENCOUNTER — Inpatient Hospital Stay (HOSPITAL_COMMUNITY): Payer: PPO

## 2017-10-08 DIAGNOSIS — E1121 Type 2 diabetes mellitus with diabetic nephropathy: Secondary | ICD-10-CM

## 2017-10-08 DIAGNOSIS — R1084 Generalized abdominal pain: Secondary | ICD-10-CM

## 2017-10-08 DIAGNOSIS — J9621 Acute and chronic respiratory failure with hypoxia: Secondary | ICD-10-CM

## 2017-10-08 DIAGNOSIS — N183 Chronic kidney disease, stage 3 (moderate): Secondary | ICD-10-CM

## 2017-10-08 DIAGNOSIS — Z930 Tracheostomy status: Secondary | ICD-10-CM

## 2017-10-08 DIAGNOSIS — J431 Panlobular emphysema: Secondary | ICD-10-CM

## 2017-10-08 LAB — COMPREHENSIVE METABOLIC PANEL
ALK PHOS: 119 U/L (ref 38–126)
ALT: 30 U/L (ref 17–63)
AST: 22 U/L (ref 15–41)
Albumin: 2.2 g/dL — ABNORMAL LOW (ref 3.5–5.0)
Anion gap: 8 (ref 5–15)
BUN: 32 mg/dL — ABNORMAL HIGH (ref 6–20)
CALCIUM: 8.3 mg/dL — AB (ref 8.9–10.3)
CO2: 27 mmol/L (ref 22–32)
CREATININE: 0.91 mg/dL (ref 0.61–1.24)
Chloride: 100 mmol/L — ABNORMAL LOW (ref 101–111)
GFR calc non Af Amer: >60 mL/min (ref >60)
GLUCOSE: 113 mg/dL — AB (ref 65–99)
Potassium: 4.3 mmol/L (ref 3.5–5.1)
SODIUM: 135 mmol/L (ref 135–145)
Total Bilirubin: 0.6 mg/dL (ref 0.3–1.2)
Total Protein: 7 g/dL (ref 6.5–8.1)

## 2017-10-08 LAB — GLUCOSE, CAPILLARY
GLUCOSE-CAPILLARY: 129 mg/dL — AB (ref 65–99)
GLUCOSE-CAPILLARY: 127 mg/dL — AB (ref 65–99)
Glucose-Capillary: 106 mg/dL — ABNORMAL HIGH (ref 65–99)
Glucose-Capillary: 91 mg/dL (ref 65–99)
Glucose-Capillary: 69 mg/dL (ref 65–99)
Glucose-Capillary: 153 mg/dL — ABNORMAL HIGH (ref 65–99)
Glucose-Capillary: 118 mg/dL — ABNORMAL HIGH (ref 65–99)
Glucose-Capillary: 123 mg/dL — ABNORMAL HIGH (ref 65–99)

## 2017-10-08 MED ORDER — COLLAGENASE 250 UNIT/GM EX OINT
TOPICAL_OINTMENT | Freq: Every day | CUTANEOUS | Status: DC
  Administered 2017-10-09: 10:00:00 via TOPICAL
  Filled 2017-10-08: qty 30

## 2017-10-08 MED ORDER — ORAL CARE MOUTH RINSE
15.0000 mL | Freq: Two times a day (BID) | OROMUCOSAL | Status: DC
  Administered 2017-10-08 – 2017-10-09 (×3): 15 mL via OROMUCOSAL

## 2017-10-08 MED ORDER — DEXTROSE-NACL 5-0.9 % IV SOLN
INTRAVENOUS | Status: DC
  Administered 2017-10-08 – 2017-10-09 (×2): via INTRAVENOUS

## 2017-10-08 MED ORDER — CHLORHEXIDINE GLUCONATE 0.12 % MT SOLN
15.0000 mL | Freq: Two times a day (BID) | OROMUCOSAL | Status: DC
  Administered 2017-10-08 – 2017-10-09 (×3): 15 mL via OROMUCOSAL
  Filled 2017-10-08 (×3): qty 15

## 2017-10-08 MED ORDER — DEXTROSE 50 % IV SOLN
INTRAVENOUS | Status: AC
  Administered 2017-10-08: 50 mL
  Filled 2017-10-08: qty 50

## 2017-10-08 NOTE — Care Management Note (Addendum)
Case Management Note  Patient Details  Name: Clinton Harrington MRN: [id number] Date of Birth: [birth year]  Subjective/Objective:   presents from [facility] w/ acute abdmonial pain from apparent dislodged Gtube, for return back to [facility] (ltach) tomorrow.      11/1 1208 Letha Capeeborah Storm Dulski  RN, BSN- NCM [facility], they will need pt eval to see where patient is with mobility and also they need cognitive eval by speech before , then will send to their medical director to review before he goes back to [facility].  MD aware and RN aware and [facility] Rep aware.    11/1 1506 Letha Capeeborah Azariyah Luhrs RN, BSN - NCM received Berkley Harveyauth from [place] at [facility] for 7 days [phone].                 Action/Plan: NCM will follow for dc needs.   Expected Discharge Date:                  Expected Discharge Plan:  Long Term Acute Care (LTAC)  In-House Referral:     Discharge planning Services  CM Consult  Post Acute Care Choice:    Choice offered to:     DME Arranged:    DME Agency:     HH Arranged:    HH Agency:     Status of Service:  In process, will continue to follow  If discussed at Long Length of Stay Meetings, dates discussed:    Additional Comments:  [name], [name], RN 10/08/2017, 4:29 PM

## 2017-10-08 NOTE — Progress Notes (Signed)
Hypoglycemic Event  CBG: 69  Treatment: dextrose  Symptoms: asymtpomatic   Follow-up CBG: Time:1650 CBG Result:153  Possible Reasons for Event: NPO   Comments/MD notified:MD C. WOODS New fluid orders given.    [name]  Giralt

## 2017-10-08 NOTE — Progress Notes (Addendum)
PROGRESS NOTE    Clinton Harrington  ZOX:[id number] DOB: 08-15-1951 DOA: 10/07/2017 PCP: Johny Blamer, MD   Brief Narrative:   66 y.o. WM PMHx    Diabetes Type 2 with Polyneuropathy, PAD, STEMI S/P CABG 3 vessels, Chronic Diastolic CHF, HTN, HLD, COPD,  Transferred from [facility] due to bowel obstruction, for OR today . He had a recent STEMI, status post CABG for three-vessel coronary artery disease and placement of right ventricular assist device on 08/28/2017. done by Dr. Morton Peters, complicated by pneumothorax. [facility] about [facility]. Prior to that he was ob [facility] from 9/15 until 10/19 Over the last several days, the patient had no bowel movements, which prompted a KUB, which showed today free air. CT of the abdomen and pelvis also confirmed free air. G-tube is in place (performed on 10/24). Was transferred to the emergency department, and was evaluated by general surgery, who plans to take him to the OR for laparotomy today.Patient has a trach, unable to indicate in conversation, but he is alert and oriented, and able to follow, and. He states that the main area of pain is his abdomen, he denies any chest pain or palpitations. He denies any shortness of breath or cough. He denies any nausea or vomiting. He denies any lower extremity swelling, and he is able to urinate without pain. No bleeding issues noted.     ED Course:  BP 120/77   Pulse 92   Temp 98.4 F (36.9 C) (Oral)   Resp 20   SpO2 97%   FFP  was given, in preparation for OR. He also was given Cefotan 2 g IV  CT A/P pneumoperitoneum concerning for viscus perforation.  No bowel obstruction or abscess was evident.  No periappendiceal regional inflammation.  Gallbladder was distended with small stones.  Inguinal hernias bilaterally. Dr. Magnus Ivan, Gen Surg, discussed with the patient today findings, recommended immediate OR. Patient is aware of the risks and benefits and wished to proceed. He  is full code.   Update : 1700 Laparoscopy was performed, showing   " The gastrostomy tube was found to be in the stomach but the stomach had separated slightly from the abdominal wall.  The tube itself was just in the edge of the stomach and there were signs of necrosis around the G-tube.  No other intra-abdominal pathology was identified."  His G tube was replaced     Subjective: 10/31  A/O 4. Patient nods yes and no appropriately (on trach collar). States abdominal pain. Negative CP, negative SOB.   Assessment & Plan:   Active Problems:   Diabetes (HCC)   Hyperlipemia   CAD (coronary artery disease)   HTN (hypertension)   Tobacco use   PAD (peripheral artery disease) (HCC)   Diabetic polyneuropathy associated with diabetes mellitus due to underlying condition (HCC)   STEMI (ST elevation myocardial infarction) (HCC)   Cardiogenic shock (HCC)   Respiratory failure (HCC)   Pressure injury of skin   CHF (congestive heart failure) (HCC)   Dysphagia   Gastrojejunostomy tube dislodgement (HCC)   Abdominal pain and distention secondary to air in the setting of dislodgement  of the G tube with replacement  , -S/P replacement by surgery -G-tube functioning however do not use until 11/1     -DC To [facility] in the A.m[facility] 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 Abigail Miyamotoouglas Blackman   Procedures/Significant Events:  10/30 EXPLORATORY LAPAROTOMY;REMOVAL AND REPLACEMENT OF GASTROSTOMY TUBE    I have personally reviewed and interpreted all radiology studies and my findings are as above.  VENTILATOR SETTINGS:    Cultures   Antimicrobials: Anti-infectives    Start     Stop   10/07/17 1330  cefoTEtan (CEFOTAN) 2 g in dextrose 5 % 50 mL IVPB     10/07/17 1509       Devices   LINES / TUBES:  Tracheostomy #6 cuffed 10/10>>>    Continuous Infusions: . 0.9 % NaCl with KCl 20 mEq / L 100 mL/hr at 10/08/17 0415     Objective: Vitals:   10/08/17 0225 10/08/17 0309 10/08/17 0319 10/08/17 0410  BP: 132/78 132/77  126/77  Pulse:   88   Resp: (!) 21 20 20 19   Temp: (!) 97 F (36.1 C) 97.8 F (36.6 C)  98.5 F (36.9 C)  TempSrc: Axillary Oral  Oral  SpO2: 96% 95% 97% 96%  Weight:      Height:        Intake/Output Summary (Last 24 hours) at 10/08/17 0759 Last data filed at 10/08/17 0700  Gross per 24 hour  Intake             2632 ml  Output              800 ml  Net             1832 ml   Filed Weights   10/07/17 2010  Weight: 177 lb 7.5 oz (80.5 kg)    Examination:  General: A/O 4, positive chronic respiratory distress Neck:  Negative scars, masses, torticollis, lymphadenopathy, JVD, #6 trach cuffed in place negative sign of infection Lungs: Clear to auscultation bilaterally without wheezes or crackles, Cardiovascular: Regular rate and rhythm without murmur gallop or rub normal S1 and S2 Abdomen: negative abdominal pain, nondistended, positive soft, bowel sounds, no rebound, no  ascites, no appreciable mass, PEG tube in place covered and clean negative sign of infection Extremities: No significant cyanosis, clubbing, or edema bilateral lower extremities Skin: Negative rashes, lesions, ulcers Psychiatric:  Negative depression, negative anxiety, negative fatigue, negative mania  Central nervous system:  Cranial nerves II through XII intact, tongue/uvula midline, all extremities muscle strength 5/5, sensation intact throughout,  negative dysarthria, negative expressive aphasia, negative receptive aphasia.  .     Data Reviewed: Care during the described time interval was provided by me .  I have reviewed this  patient's available data, including medical history, events of note, physical examination, and all test results as part of my evaluation.   CBC:  Recent Labs Lab 10/03/17 0645 10/04/17 0725 10/05/17 0513 10/06/17 0728 10/07/17 1345  WBC 8.0 7.4 8.1 8.6 8.2  NEUTROABS  --   --   --   --  6.3  HGB 9.7* 9.3* 10.1* 10.3* 10.4*  HCT 30.2* 29.7* 32.3* 32.6* 32.5*  MCV 89.9 89.5 89.2 88.6 88.6  PLT 310 318 355 382 334   Basic Metabolic Panel:  Recent Labs Lab 10/04/17 0725 10/05/17 0513 10/06/17 0728 10/07/17 0638 10/08/17 0623  NA 128* 133* 131* 133* 135  K 5.3* 3.9 4.1 3.8 4.3  CL 97* 97* 97* 99* 100*  CO2 23 26 25 24 27   GLUCOSE 260* 97 123* 105* 113*  BUN 33* 36* 46* 41* 32*  CREATININE 1.03 1.01 1.17 1.02 0.91  CALCIUM 8.2* 8.3* 8.3* 8.2* 8.3*   GFR: Estimated Creatinine Clearance: 84.5 mL/min (by C-G formula based on SCr of 0.91 mg/dL). Liver Function Tests:  Recent Labs Lab 10/03/17 0645 10/04/17 0725 10/05/17 0513 10/06/17 0728 10/08/17 0623  AST 43* 40 71* 50* 22  ALT 42 40 59 48 30  ALKPHOS 125 122 134* 134* 119  BILITOT 0.7 0.6 0.6 0.7 0.6  PROT 7.3 7.0 7.4 7.5 7.0  ALBUMIN 2.0* 1.9* 2.0* 2.2* 2.2*   No results for input(s): LIPASE, AMYLASE in the last 168 hours. No results for input(s): AMMONIA in the last 168  hours. Coagulation Profile:  Recent Labs Lab 10/03/17 0645 10/04/17 0725 10/05/17 0513 10/06/17 0728 10/07/17 0638  INR 2.43 3.29 2.03 1.95 2.58   Cardiac Enzymes:  Recent Labs Lab 10/07/17 1545  TROPONINI <0.03   BNP (last 3 results) No results for input(s): PROBNP in the last 8760 hours. HbA1C: No results for input(s): HGBA1C in the last 72 hours. CBG:  Recent Labs Lab 10/07/17 1546 10/07/17 2139 10/07/17 2353 10/08/17 0318  GLUCAP 95 115* 124* 129*   Lipid Profile: No results for input(s): CHOL, HDL, LDLCALC, TRIG, CHOLHDL, LDLDIRECT in the last 72 hours. Thyroid Function Tests: No results for input(s): TSH, T4TOTAL, FREET4, T3FREE, THYROIDAB in the last 72 hours. Anemia Panel: No results for input(s): VITAMINB12, FOLATE, FERRITIN, TIBC, IRON, RETICCTPCT in the last 72 hours. Urine analysis:    Component Value Date/Time   COLORURINE YELLOW 09/18/2017 1140   APPEARANCEUR CLEAR 09/18/2017 1140   LABSPEC 1.010 09/18/2017 1140   PHURINE 6.0 09/18/2017 1140   GLUCOSEU NEGATIVE 09/18/2017 1140   HGBUR LARGE (A) 09/18/2017 1140   BILIRUBINUR NEGATIVE 09/18/2017 1140   KETONESUR NEGATIVE 09/18/2017 1140   PROTEINUR NEGATIVE 09/18/2017 1140   NITRITE NEGATIVE 09/18/2017 1140   LEUKOCYTESUR TRACE (A) 09/18/2017 1140   Sepsis Labs: @LABRCNTIP (procalcitonin:4,lacticidven:4)  ) Recent Results (from the past 240 hour(s))  MRSA PCR Screening     Status: None   Collection Time: 10/07/17  8:50 PM  Result Value Ref Range Status   MRSA by PCR NEGATIVE NEGATIVE Final    Comment:        The GeneXpert MRSA Assay (FDA approved for NASAL specimens only), is one component of a comprehensive MRSA colonization surveillance program. It is not intended to diagnose MRSA infection nor to guide or monitor treatment for MRSA infections.          Radiology Studies: Ct Abdomen Pelvis Wo Contrast  Result Date: 10/07/2017 CLINICAL DATA:  Severe abdominal pain EXAM:  CT ABDOMEN AND PELVIS WITHOUT  CONTRAST TECHNIQUE: Multidetector CT imaging of the abdomen and pelvis was performed following the standard protocol without oral or intravenous contrast maternal administration. COMPARISON:  September 22, 2017 FINDINGS: Lower chest: There are pleural effusions bilaterally with left base consolidation. There are multiple foci of coronary artery calcification. There is a small pericardial effusion. Hepatobiliary: No focal liver lesions are evident on this noncontrast enhanced study. There is sludge in a distended gallbladder. There is a gallstone present within the gallbladder. There is no appreciable gallbladder wall thickening. No biliary duct dilatation evident. Pancreas: There is no pancreatic mass or inflammatory focus. Spleen: No splenic lesions are evident. Adrenals/Urinary Tract: Adrenals appear unremarkable bilaterally. Kidneys bilaterally show no evident mass or hydronephrosis on either side. There is no appreciable renal or ureteral calculus on either side. Urinary bladder contains a Foley catheter. There is air within the urinary bladder which may be due to instrumentation. There is calcification along the leftward posterior aspect of the urinary bladder. Note than a minimal amount of the anterior urinary bladder extends into a left inguinal hernia, less than on prior study. Stomach/Bowel: There is extensive pneumoperitoneum. A gastrostomy is positioned in the stomach. There are multiple loops of mildly dilated colon. No small bowel dilatation noted. No bowel wall thickening. No evident bowel obstruction. No portal venous air evident. Vascular/Lymphatic: There is atherosclerotic calcification in the aorta and iliac arteries. No aneurysm evident. Major mesenteric vessels appear patent on this noncontrast enhanced study. There are foci of mesenteric arterial calcification, particularly in the periphery of each renal artery. There is no adenopathy appreciable in the abdomen or  pelvis. Reproductive: There are calcifications in the leftward aspect of the prostate. Prostate and seminal vesicles appear normal in size and contour. No pelvic masses evident. Other: There is fat in each inguinal ring. Areas seen within the left inguinal hernia. There is no periappendiceal region inflammation. No abscess or ascites evident in the abdomen or pelvis. Musculoskeletal: There is anterior wedging of the L2 vertebral body, stable. There is degenerative change in the lower thoracic and lumbar spine. No evident blastic or lytic bone lesions. No intramuscular or abdominal wall lesions. IMPRESSION: 1. Extensive pneumoperitoneum. Patient has had recent placement of gastrostomy catheter gastrostomy position within the stomach. This degree of pneumoperitoneum is concerning for viscus perforation, however. 2. No bowel obstruction evident. No abscess. No periappendiceal region inflammation. 3. Distended gallbladder with small gallstone and sludge. Gallbladder wall does not appear appreciably thickened. 4. Inguinal hernias bilaterally. Small amount of leftward aspect of urinary bladder extends into left inguinal hernia, less than on prior study. 5. Air within the urinary bladder may be due to instrumentation. There is calcification along the posterolateral aspect urinary bladder wall, likely of inflammatory etiology. 6. No hydronephrosis. No renal or ureteral calculus. There are prostatic calculi. 7.  Aortoiliac and scattered mesenteric arterial calcifications. 8.  Stable anterior wedging of the L2 vertebral body. 9. Bilateral pleural effusions with left base consolidation/atelectasis. 10.  Small pericardial effusion. Critical Value/emergent results were called by telephone at the time of interpretation on 10/07/2017 at 11:38 am to Dr. Carron Curie , who verbally acknowledged these results. Electronically Signed   By: Bretta Bang III M.D.   On: 10/07/2017 11:38   Dg Abd Portable 1v  Result Date:  10/07/2017 CLINICAL DATA:  Small-bowel obstruction. EXAM: PORTABLE ABDOMEN - 1 VIEW COMPARISON:  10/05/2017.  CT 09/22/2017 FINDINGS: Gastrostomy tube in stable position in the left upper quadrant. Persistent distention of stomach, small bowel, and colon. Adynamic  ileus may be present. Bowel obstruction including colonic cannot be excluded. Linear collection of air is noted a right upper quadrant. Although this may represent air in the duodenum, air in the biliary system or free intraperitoneal air cannot be excluded. No subdiaphragmatic free air identified. Further evaluation of this patient with repeat CT of the abdomen and pelvis without oral contrast is suggested to evaluate for adynamic ileus, gallstone ileus, small or large bowel obstruction, and free intraperitoneal air. Degenerative changes lumbar spine. Prior median sternotomy. Left lower lobe atelectasis/consolidation. IMPRESSION: 1. Gastrostomy tube in stable position in the left upper quadrant. 2. Persistent distention of stomach, small bowel, and colon. Adynamic ileus may be present. Bowel obstruction including colonic obstruction cannot be excluded. Linear collection of air is in the right upper quadrant. Although this may represent air in the duodenum, air in the biliary system or free intraperitoneal air cannot be excluded. Further evaluation of this patient with repeat CT of the abdomen and pelvis without oral contrast is suggested to evaluate for adynamic ileus, gallstone ileus, small or large bowel obstruction, and free intraperitoneal air. 3.  Left lower lobe atelectasis/ consolidation. Critical Value/emergent results were called by telephone at the time of interpretation on 10/07/2017 at 9:17 am to Dr. Sharyon Medicus, who verbally acknowledged these results. Electronically Signed   By: Maisie Fus  Register   On: 10/07/2017 09:19        Scheduled Meds: . budesonide  0.25 mg Nebulization BID  . chlorhexidine  15 mL Mouth Rinse BID  . enoxaparin  (LOVENOX) injection  40 mg Subcutaneous Q24H  . insulin aspart  0-9 Units Subcutaneous Q4H  . mouth rinse  15 mL Mouth Rinse q12n4p   Continuous Infusions: . 0.9 % NaCl with KCl 20 mEq / L 100 mL/hr at 10/08/17 0415     LOS: 1 day    Time spent: 40 minutes    WOODS, Roselind Messier, MD [facility] Pager [phone]   If 7PM-7AM, please contact night-coverage [email or web address] Password [email or web address] 10/08/2017, 7:59 AM

## 2017-10-08 NOTE — Progress Notes (Signed)
1 Day Post-Op   Subjective/Chief Complaint: Complains of incisional pain   Objective: Vital signs in last 24 hours: Temp:  [97 F (36.1 C)-98.5 F (36.9 C)] 98.1 F (36.7 C) (10/31 0814) Pulse Rate:  [80-92] 88 (10/31 0902) Resp:  [15-36] 22 (10/31 0902) BP: (106-132)/(8-79) 129/75 (10/31 0902) SpO2:  [94 %-100 %] 95 % (10/31 0902) FiO2 (%):  [28 %] 28 % (10/31 0902) Weight:  [80.5 kg (177 lb 7.5 oz)] 80.5 kg (177 lb 7.5 oz) (10/30 2010)    Intake/Output from previous day: 10/30 0701 - 10/31 0700 In: 2632 [I.V.:2075; Blood:557] Out: 800 [Urine:600; Drains:180; Blood:20] Intake/Output this shift: No intake/output data recorded.  Exam: Awake and alert Lungs clear Abdomen soft, g-tube in place, incision clean  Lab Results:   Recent Labs  10/06/17 0728 10/07/17 1345  WBC 8.6 8.2  HGB 10.3* 10.4*  HCT 32.6* 32.5*  PLT 382 334   BMET  Recent Labs  10/07/17 0638 10/08/17 0623  NA 133* 135  K 3.8 4.3  CL 99* 100*  CO2 24 27  GLUCOSE 105* 113*  BUN 41* 32*  CREATININE 1.02 0.91  CALCIUM 8.2* 8.3*   PT/INR  Recent Labs  10/06/17 0728 10/07/17 0638  LABPROT 22.1* 27.5*  INR 1.95 2.58   ABG No results for input(s): PHART, HCO3 in the last 72 hours.  Invalid input(s): PCO2, PO2  Studies/Results: Ct Abdomen Pelvis Wo Contrast  Result Date: 10/07/2017 CLINICAL DATA:  Severe abdominal pain EXAM: CT ABDOMEN AND PELVIS WITHOUT CONTRAST TECHNIQUE: Multidetector CT imaging of the abdomen and pelvis was performed following the standard protocol without oral or intravenous contrast maternal administration. COMPARISON:  September 22, 2017 FINDINGS: Lower chest: There are pleural effusions bilaterally with left base consolidation. There are multiple foci of coronary artery calcification. There is a small pericardial effusion. Hepatobiliary: No focal liver lesions are evident on this noncontrast enhanced study. There is sludge in a distended gallbladder. There is a  gallstone present within the gallbladder. There is no appreciable gallbladder wall thickening. No biliary duct dilatation evident. Pancreas: There is no pancreatic mass or inflammatory focus. Spleen: No splenic lesions are evident. Adrenals/Urinary Tract: Adrenals appear unremarkable bilaterally. Kidneys bilaterally show no evident mass or hydronephrosis on either side. There is no appreciable renal or ureteral calculus on either side. Urinary bladder contains a Foley catheter. There is air within the urinary bladder which may be due to instrumentation. There is calcification along the leftward posterior aspect of the urinary bladder. Note than a minimal amount of the anterior urinary bladder extends into a left inguinal hernia, less than on prior study. Stomach/Bowel: There is extensive pneumoperitoneum. A gastrostomy is positioned in the stomach. There are multiple loops of mildly dilated colon. No small bowel dilatation noted. No bowel wall thickening. No evident bowel obstruction. No portal venous air evident. Vascular/Lymphatic: There is atherosclerotic calcification in the aorta and iliac arteries. No aneurysm evident. Major mesenteric vessels appear patent on this noncontrast enhanced study. There are foci of mesenteric arterial calcification, particularly in the periphery of each renal artery. There is no adenopathy appreciable in the abdomen or pelvis. Reproductive: There are calcifications in the leftward aspect of the prostate. Prostate and seminal vesicles appear normal in size and contour. No pelvic masses evident. Other: There is fat in each inguinal ring. Areas seen within the left inguinal hernia. There is no periappendiceal region inflammation. No abscess or ascites evident in the abdomen or pelvis. Musculoskeletal: There is anterior wedging of the  L2 vertebral body, stable. There is degenerative change in the lower thoracic and lumbar spine. No evident blastic or lytic bone lesions. No  intramuscular or abdominal wall lesions. IMPRESSION: 1. Extensive pneumoperitoneum. Patient has had recent placement of gastrostomy catheter gastrostomy position within the stomach. This degree of pneumoperitoneum is concerning for viscus perforation, however. 2. No bowel obstruction evident. No abscess. No periappendiceal region inflammation. 3. Distended gallbladder with small gallstone and sludge. Gallbladder wall does not appear appreciably thickened. 4. Inguinal hernias bilaterally. Small amount of leftward aspect of urinary bladder extends into left inguinal hernia, less than on prior study. 5. Air within the urinary bladder may be due to instrumentation. There is calcification along the posterolateral aspect urinary bladder wall, likely of inflammatory etiology. 6. No hydronephrosis. No renal or ureteral calculus. There are prostatic calculi. 7.  Aortoiliac and scattered mesenteric arterial calcifications. 8.  Stable anterior wedging of the L2 vertebral body. 9. Bilateral pleural effusions with left base consolidation/atelectasis. 10.  Small pericardial effusion. Critical Value/emergent results were called by telephone at the time of interpretation on 10/07/2017 at 11:38 am to Dr. Carron Curie , who verbally acknowledged these results. Electronically Signed   By: Bretta Bang III M.D.   On: 10/07/2017 11:38   X-ray Chest Pa And Lateral  Result Date: 10/08/2017 CLINICAL DATA:  Difficulty breathing.  Tracheostomy present EXAM: CHEST  2 VIEW COMPARISON:  September 27, 2017 FINDINGS: Tracheostomy catheter tip is 4.2 cm above the carina. There is no appreciable pneumothorax. There is a layering pleural effusion on the left with left base consolidation. Right lung is clear. Heart is mildly enlarged with pulmonary vascularity within normal limits. Patient is status post coronary artery bypass grafting. There is aortic atherosclerosis. No adenopathy. No evident bone lesions. IMPRESSION: Tracheostomy as  described without pneumothorax. Layering effusion on the left with left base consolidation. Right lung clear. Stable cardiac prominence. There is aortic atherosclerosis. Aortic Atherosclerosis (ICD10-I70.0). Electronically Signed   By: Bretta Bang III M.D.   On: 10/08/2017 09:56   Abd 1 View (kub)  Result Date: 10/08/2017 CLINICAL DATA:  Status post laparotomy. Gastrostomy catheter present EXAM: ABDOMEN - 1 VIEW COMPARISON:  CT abdomen and pelvis October 07, 2017 FINDINGS: Gastrostomy catheter is noted in the medial left mid abdomen, at the expected level of the lower stomach. There is moderate air in the colon. There is no bowel dilatation or air-fluid level to indicate bowel obstruction. There are areas of free air, potentially due to recent laparotomy. There are foci of iliac artery calcification bilaterally. IMPRESSION: No bowel obstruction. Scattered foci of free air noted, likely of postoperative etiology. Gastrostomy catheter in medial left mid abdomen, likely in or overlying stomach. Exact positioning of gastrostomy catheter cannot be determined on single-view examination. Bilateral iliac artery calcification noted. Electronically Signed   By: Bretta Bang III M.D.   On: 10/08/2017 09:59   Dg Abd Portable 1v  Result Date: 10/07/2017 CLINICAL DATA:  Small-bowel obstruction. EXAM: PORTABLE ABDOMEN - 1 VIEW COMPARISON:  10/05/2017.  CT 09/22/2017 FINDINGS: Gastrostomy tube in stable position in the left upper quadrant. Persistent distention of stomach, small bowel, and colon. Adynamic ileus may be present. Bowel obstruction including colonic cannot be excluded. Linear collection of air is noted a right upper quadrant. Although this may represent air in the duodenum, air in the biliary system or free intraperitoneal air cannot be excluded. No subdiaphragmatic free air identified. Further evaluation of this patient with repeat CT of the abdomen and pelvis  without oral contrast is suggested to  evaluate for adynamic ileus, gallstone ileus, small or large bowel obstruction, and free intraperitoneal air. Degenerative changes lumbar spine. Prior median sternotomy. Left lower lobe atelectasis/consolidation. IMPRESSION: 1. Gastrostomy tube in stable position in the left upper quadrant. 2. Persistent distention of stomach, small bowel, and colon. Adynamic ileus may be present. Bowel obstruction including colonic obstruction cannot be excluded. Linear collection of air is in the right upper quadrant. Although this may represent air in the duodenum, air in the biliary system or free intraperitoneal air cannot be excluded. Further evaluation of this patient with repeat CT of the abdomen and pelvis without oral contrast is suggested to evaluate for adynamic ileus, gallstone ileus, small or large bowel obstruction, and free intraperitoneal air. 3.  Left lower lobe atelectasis/ consolidation. Critical Value/emergent results were called by telephone at the time of interpretation on 10/07/2017 at 9:17 am to Dr. Sharyon MedicusHijazi, who verbally acknowledged these results. Electronically Signed   By: Maisie Fushomas  Register   On: 10/07/2017 09:19    Anti-infectives: Anti-infectives    Start     Dose/Rate Route Frequency Ordered Stop   10/07/17 1330  cefoTEtan (CEFOTAN) 2 g in dextrose 5 % 50 mL IVPB     2 g 100 mL/hr over 30 Minutes Intravenous NOW 10/07/17 1328 10/07/17 1509      Assessment/Plan: s/p Procedure(s): EXPLORATORY LAPAROTOMY (N/A) REMOVAL AND REPLACEMENT OF GASTROSTOMY TUBE (N/A)  Will continue to keep NPO and keep current g-tube to gravity drain.  Given repair of gastrostomy tube site, will hold on tube feeds until Friday. [facility].  LOS: 1 day    Aniylah Avans A 10/08/2017

## 2017-10-08 NOTE — Consult Note (Signed)
WOC Nurse wound consult note Reason for Consult:multiple pressure injuries Patient known to this WOC nurse  from his previous admission Wound type:  Unstageable Pressure injury right buttock: 7.5cm x 9cm x 0.1cm  Stage 2 Pressure injury left buttock 4cm x 2cm x 0.1cm  Unstageable Pressure injury occiput: 2cm x 10cm x 0.1cm   Pressure Injury POA: Yes Measurement:see above  Wound bed:  Left buttock; 100% pink, red,clean Right buttock: 50% yellow/black soft nonviable tissue, 50% pink Occiput: 90% yellow/black soft non viable tissue, 10% pink Drainage (amount, consistency, odor) minimal from each site, serosanguinous, no odor Periwound: intact  Dressing procedure/placement/frequency: Enzymatic debridement ointment to the left buttock and occiput wound, cover with small 2x2 moistened with saline, top with foam dressing. Reapply Santyl and moist gauze daily, change foam every 3 days.   Per [facility]    Discussed POC with bedside nurse.  Re consult if needed, will not follow at this time. Thanks  Kaya Pottenger M[employer] MSN, RN,CWOCN, CNS, CWON-AP [phone])

## 2017-10-08 NOTE — Evaluation (Signed)
[facility] Speaking [facility]ve - Evaluation Patient Details  Name: Clinton Harrington MRN: [id number] Date of Birth: 07/17/1951  Today's Date: 10/08/2017 Time: 1400-1416 SLP Time Calculation (min) (ACUTE ONLY): 16 min  Past Medical History:  Past Medical History:  Diagnosis Date  . Arthritis   . Diabetes (HCC)   . Diabetes mellitus Jan. 1999  . High cholesterol   . Hypertension   . Myocardial infarct (HCC)   . Peripheral vascular disease (HCC)   . Seasonal allergies   . Smoker   . Urine incontinence    Past Surgical History:  Past Surgical History:  Procedure Laterality Date  . CORONARY ARTERY BYPASS GRAFT N/A 08/28/2017   Procedure: CORONARY ARTERY BYPASS GRAFTING (CABG) times four using left internal mammary artery and right greater saphenous leg vein using endoscope.;  Surgeon: Kerin PernaVan Trigt, Peter, MD;  [facility];  Service: Open Heart Surgery;  Laterality: N/A;  . CORONARY STENT PLACEMENT  Jan.  1999  . FEMORAL-POPLITEAL BYPASS GRAFT Left 01/03/2016   Procedure: LEFT COMMON FEMORAL-BELOW KNEE POPLITEAL ARTERY BYPASS GRAFT USING NON REVERSED TRANSLOCATED SAPHENOUS VEIN GRAFT;  Surgeon: Pryor OchoaJames D Lawson, MD;  [facility];  Service: Vascular;  Laterality: Left;  . IABP INSERTION N/A 08/23/2017   Procedure: IABP Insertion;  Surgeon: Runell GessBerry, Jonathan J, MD;  [facility];  Service: Cardiovascular;  Laterality: N/A;  . INTRAOPERATIVE ARTERIOGRAM Left 01/03/2016   Procedure: INTRA OPERATIVE ARTERIOGRAM LEFT LEG;  Surgeon: Pryor OchoaJames D Lawson, MD;  [facility];  Service: Vascular;  Laterality: Left;  . IR GASTROSTOMY TUBE MOD SED  10/01/2017  . LAPAROTOMY N/A 10/07/2017   Procedure: EXPLORATORY LAPAROTOMY;  Surgeon: Abigail MiyamotoBlackman, Douglas, MD;  [facility];  Service: General;  Laterality: N/A;  . LEFT HEART CATH AND CORS/GRAFTS ANGIOGRAPHY N/A 08/23/2017   Procedure: LEFT HEART CATH AND CORS/GRAFTS ANGIOGRAPHY;  Surgeon: Runell GessBerry, Jonathan J, MD;  [facility]: [facility] INVAS[facility] CV LAB;   Service: Cardiovascular;  Laterality: N/A;  . PERIPHERAL VASCULAR CATHETERIZATION N/A 01/01/2016   Procedure: Abdominal Aortogram w/Lower Extremity;  Surgeon: Fransisco HertzBrian L Chen, MD;  [facility];  Service: Cardiovascular;  Laterality: N/A;  . PERIPHERAL VASCULAR CATHETERIZATION Left 01/01/2016   Procedure: Peripheral Vascular Balloon Angioplasty;  Surgeon: Fransisco HertzBrian L Chen, MD;  [facility];  Service: Cardiovascular;  Laterality: Left;  Popliteal artery  . PERIPHERAL VASCULAR CATHETERIZATION N/A 12/04/2016   Procedure: Abdominal Aortogram w/Lower Extremity;  Surgeon: Maeola HarmanBrandon Christopher Cain, MD;  [facility];  Service: Cardiovascular;  Laterality: N/A;  . PERIPHERAL VASCULAR CATHETERIZATION Left 12/04/2016   Procedure: Peripheral Vascular Balloon Angioplasty;  Surgeon: Maeola HarmanBrandon Christopher Cain, MD;  [facility];  Service: Cardiovascular;  Laterality: Left;  Fem_Pop bypass  . PLACEMENT OF CENTRIMAG VENTRICULAR ASSIST DEVICE Right 08/28/2017   Procedure: PLACEMENT OF CENTRIMAG VENTRICULAR ASSIST DEVICE;  Surgeon: Kerin PernaVan Trigt, Peter, MD;  [facility];  Service: Open Heart Surgery;  Laterality: Right;  . REMOVAL OF CENTRIMAG VENTRICULAR ASSIST DEVICE N/A 09/08/2017   Procedure: REMOVAL OF R-VENTRICULAR ASSIST DEVICE WITH CARDIOPULMONARY BYPASS;  Surgeon: Kerin PernaVan Trigt, Peter, MD;  [facility];  Service: Open Heart Surgery;  Laterality: N/A;  . REMOVAL OF GASTROSTOMY TUBE N/A 10/07/2017   Procedure: REMOVAL AND REPLACEMENT OF GASTROSTOMY TUBE;  Surgeon: Abigail MiyamotoBlackman, Douglas, MD;  Location: [facility] OR;  Service: General;  Laterality: N/A;  . TEE WITHOUT CARDIOVERSION N/A 08/28/2017   Procedure: TRANSESOPHAGEAL ECHOCARDIOGRAM (TEE);  Surgeon: Kerin PernaVan Trigt, Peter, MD;  Location:  [facility] OR;  Service: Open Heart Surgery;  Laterality: N/A;  . TEE WITHOUT CARDIOVERSION N/A 09/08/2017   Procedure: TRANSESOPHAGEAL ECHOCARDIOGRAM (TEE);  Surgeon: Donata Clay, Theron Arista, MD;   [facility];  Service: Open Heart Surgery;  Laterality: N/A;   HPI:  Pt is a 66 y.o.malewith very complex medical history, including diabetes, hypertension, hyperlipidemia, recent STEMI s/p CABG wtih trach/PEG, and COPD, who was transferred from [facility] due to abdominal pain and distention secondary to air in the setting of dislodgment of the g tube. He is now s/p replacement.   Assessment / Plan / Recommendation Clinical Impression  Cuff was deflated and PMV was placed by SLP, with no evidence of upper airway patency. He does not produce phonation and no airflow is felt when asked to forcefully exhale. His cough is weak and does not seem to clear secretions well, even tracheally, although minimal wet quality is noted. PMV is only kept in place for a few respiratory cycles before back pressure is noted upon removal. Suspect that his performance is impacted by his trach size, potentially secretions, and overall reduced breath support. Will continue to follow acutely. SLP Visit Diagnosis: Aphonia (R49.1)    SLP Assessment  Patient needs continued Speech Lanaguage Pathology Services    Follow Up Recommendations  LTACH    Frequency and Duration min 2x/week  2 weeks    PMSV Trial PMSV was placed for: few breath cycles at a time Able to redirect subglottic air through upper airway: No Able to Attain Phonation: No Voice Quality: Aphonic Able to Expectorate Secretions: No Level of Secretion Expectoration with PMSV: Not observed Breath Support for Phonation: Inadequate Intelligibility: Unable to assess (comment) Respirations During Trial: 26 SpO2 During Trial: 90 % Pulse During Trial: 88 Behavior: Alert;Cooperative   Tracheostomy Tube       Vent Dependency  FiO2 (%): 28 %    Cuff Deflation Trial  GO Tolerated Cuff Deflation: Yes Length of Time for Cuff Deflation Trial: 10 min Behavior: Alert;Cooperative        Maxcine Ham 10/08/2017, 2:32 PM   Maxcine Ham, M.A. CCC-SLP [phone]

## 2017-10-08 NOTE — Progress Notes (Signed)
Pt seen by trach team for consult. No education needed at this time. All necessary equipment available at bedside. Will continue to follow for progression.

## 2017-10-09 ENCOUNTER — Inpatient Hospital Stay
Admission: AD | Admit: 2017-10-09 | Discharge: 2017-11-07 | Disposition: A | Discharge status: Skilled Nursing Facility | Payer: Self-pay | Source: Ambulatory Visit | Attending: Internal Medicine | Admitting: Internal Medicine

## 2017-10-09 ENCOUNTER — Other Ambulatory Visit (HOSPITAL_COMMUNITY): Payer: Self-pay

## 2017-10-09 DIAGNOSIS — J9690 Respiratory failure, unspecified, unspecified whether with hypoxia or hypercapnia: Secondary | ICD-10-CM

## 2017-10-09 DIAGNOSIS — Z9889 Other specified postprocedural states: Secondary | ICD-10-CM

## 2017-10-09 DIAGNOSIS — K668 Other specified disorders of peritoneum: Secondary | ICD-10-CM

## 2017-10-09 DIAGNOSIS — E785 Hyperlipidemia, unspecified: Secondary | ICD-10-CM

## 2017-10-09 DIAGNOSIS — Z434 Encounter for attention to other artificial openings of digestive tract: Secondary | ICD-10-CM

## 2017-10-09 DIAGNOSIS — J9611 Chronic respiratory failure with hypoxia: Secondary | ICD-10-CM

## 2017-10-09 DIAGNOSIS — R509 Fever, unspecified: Secondary | ICD-10-CM

## 2017-10-09 DIAGNOSIS — R109 Unspecified abdominal pain: Secondary | ICD-10-CM

## 2017-10-09 LAB — GLUCOSE, CAPILLARY
GLUCOSE-CAPILLARY: 130 mg/dL — AB (ref 65–99)
Glucose-Capillary: 119 mg/dL — ABNORMAL HIGH (ref 65–99)
Glucose-Capillary: 125 mg/dL — ABNORMAL HIGH (ref 65–99)

## 2017-10-09 LAB — BPAM FFP
BLOOD PRODUCT EXPIRATION DATE: 201810312359
Blood Product Expiration Date: 201811022359
Blood Product Expiration Date: 201811022359
ISSUE DATE / TIME: 201810301352
ISSUE DATE / TIME: 201810310006
ISSUE DATE / TIME: 201810310240
UNIT TYPE AND RH: 6200
UNIT TYPE AND RH: 6200
Unit Type and Rh: 6200

## 2017-10-09 LAB — PREPARE FRESH FROZEN PLASMA
UNIT DIVISION: 0
UNIT DIVISION: 0
Unit division: 0

## 2017-10-09 MED ORDER — DEXTROSE-NACL 5-0.9 % IV SOLN: 100.0000 mL/h | INTRAVENOUS | Status: DC

## 2017-10-09 MED ORDER — ENOXAPARIN SODIUM 40 MG/0.4ML ~~LOC~~ SOLN: 40.0000 mg | SUBCUTANEOUS | Status: DC

## 2017-10-09 NOTE — Progress Notes (Signed)
[facility] is transferring to [facility]. Report called to accepting nurse Milon DikesShannon Odell. Patients VS are stable for transfer.

## 2017-10-09 NOTE — Discharge Summary (Signed)
[facility] Discharge Summary   Patient ID: Clinton Harrington MRN: [id number] DOB/AGE: Feb 19, 1951 66 y.o.  Admit date: 10/07/2017 Discharge date: 10/09/2017  Discharge Diagnosis Patient Active Problem List   Diagnosis Date Noted  . Bowel obstruction ([place]) 10/07/2017  . Gastrojejunostomy tube dislodgement ([place]) 10/07/2017  . Dysphagia   . Acute encephalopathy   . HCAP (healthcare-associated pneumonia)   . Acute respiratory failure with hypoxemia ([place])   . Acute pulmonary edema (HCC)   . CHF (congestive heart failure) ([place])   . Heart failure ([place]) 09/08/2017  . Pressure injury of skin 09/06/2017  . Respiratory failure ([place])   . STEMI (ST elevation myocardial infarction) ([place]) 08/23/2017  . Cardiogenic shock ([place])   . Diabetic polyneuropathy associated with diabetes mellitus due to underlying condition ([place]) 03/28/2016  . PAD (peripheral artery disease) ([place]) 01/23/2016  . Extremity atherosclerosis with gangrene ([place]) 01/01/2016  . Hx of CABG   . Dyslipidemia, goal LDL below 70   . Atherosclerosis of native arteries of the extremities with gangrene ([place]) 12/29/2015  . Ulcer of heel and midfoot ([place]) 11/09/2015  . Diabetes ([place]) 10/30/2012  . Hyperlipemia 10/30/2012  . CAD (coronary artery disease) 10/30/2012  . HTN (hypertension) 10/30/2012  . Tobacco use 10/30/2012    Consultants WOC RN   Imaging: Ct Abdomen Pelvis Wo Contrast  Result Date: 10/07/2017 CLINICAL DATA:  Severe abdominal pain EXAM: CT ABDOMEN AND PELVIS WITHOUT CONTRAST TECHNIQUE: Multidetector CT imaging of the abdomen and pelvis was performed following the standard protocol without oral or intravenous contrast maternal administration. COMPARISON:  September 22, 2017 FINDINGS: Lower chest: There are pleural effusions bilaterally with left base consolidation. There are multiple foci of coronary artery calcification. There is a small pericardial effusion. Hepatobiliary: No focal liver lesions are  evident on this noncontrast enhanced study. There is sludge in a distended gallbladder. There is a gallstone present within the gallbladder. There is no appreciable gallbladder wall thickening. No biliary duct dilatation evident. Pancreas: There is no pancreatic mass or inflammatory focus. Spleen: No splenic lesions are evident. Adrenals/Urinary Tract: Adrenals appear unremarkable bilaterally. Kidneys bilaterally show no evident mass or hydronephrosis on either side. There is no appreciable renal or ureteral calculus on either side. Urinary bladder contains a Foley catheter. There is air within the urinary bladder which may be due to instrumentation. There is calcification along the leftward posterior aspect of the urinary bladder. Note than a minimal amount of the anterior urinary bladder extends into a left inguinal hernia, less than on prior study. Stomach/Bowel: There is extensive pneumoperitoneum. A gastrostomy is positioned in the stomach. There are multiple loops of mildly dilated colon. No small bowel dilatation noted. No bowel wall thickening. No evident bowel obstruction. No portal venous air evident. Vascular/Lymphatic: There is atherosclerotic calcification in the aorta and iliac arteries. No aneurysm evident. Major mesenteric vessels appear patent on this noncontrast enhanced study. There are foci of mesenteric arterial calcification, particularly in the periphery of each renal artery. There is no adenopathy appreciable in the abdomen or pelvis. Reproductive: There are calcifications in the leftward aspect of the prostate. Prostate and seminal vesicles appear normal in size and contour. No pelvic masses evident. Other: There is fat in each inguinal ring. Areas seen within the left inguinal hernia. There is no periappendiceal region inflammation. No abscess or ascites evident in the abdomen or pelvis. Musculoskeletal: There is anterior wedging of the L2 vertebral body, stable. There is degenerative  change in the lower thoracic and lumbar spine.  No evident blastic or lytic bone lesions. No intramuscular or abdominal wall lesions. IMPRESSION: 1. Extensive pneumoperitoneum. Patient has had recent placement of gastrostomy catheter gastrostomy position within the stomach. This degree of pneumoperitoneum is concerning for viscus perforation, however. 2. No bowel obstruction evident. No abscess. No periappendiceal region inflammation. 3. Distended gallbladder with small gallstone and sludge. Gallbladder wall does not appear appreciably thickened. 4. Inguinal hernias bilaterally. Small amount of leftward aspect of urinary bladder extends into left inguinal hernia, less than on prior study. 5. Air within the urinary bladder may be due to instrumentation. There is calcification along the posterolateral aspect urinary bladder wall, likely of inflammatory etiology. 6. No hydronephrosis. No renal or ureteral calculus. There are prostatic calculi. 7.  Aortoiliac and scattered mesenteric arterial calcifications. 8.  Stable anterior wedging of the L2 vertebral body. 9. Bilateral pleural effusions with left base consolidation/atelectasis. 10.  Small pericardial effusion. Critical Value/emergent results were called by telephone at the time of interpretation on 10/07/2017 at 11:38 am to Dr. Merton Border , who verbally acknowledged these results. Electronically Signed   By: Lowella Grip III M.D.   On: 10/07/2017 11:38   X-ray Chest Pa And Lateral  Result Date: 10/08/2017 CLINICAL DATA:  Difficulty breathing.  Tracheostomy present EXAM: CHEST  2 VIEW COMPARISON:  September 27, 2017 FINDINGS: Tracheostomy catheter tip is 4.2 cm above the carina. There is no appreciable pneumothorax. There is a layering pleural effusion on the left with left base consolidation. Right lung is clear. Heart is mildly enlarged with pulmonary vascularity within normal limits. Patient is status post coronary artery bypass grafting. There is aortic  atherosclerosis. No adenopathy. No evident bone lesions. IMPRESSION: Tracheostomy as described without pneumothorax. Layering effusion on the left with left base consolidation. Right lung clear. Stable cardiac prominence. There is aortic atherosclerosis. Aortic Atherosclerosis (ICD10-I70.0). Electronically Signed   By: Lowella Grip III M.D.   On: 10/08/2017 09:56   Abd 1 View (kub)  Result Date: 10/08/2017 CLINICAL DATA:  Status post laparotomy. Gastrostomy catheter present EXAM: ABDOMEN - 1 VIEW COMPARISON:  CT abdomen and pelvis October 07, 2017 FINDINGS: Gastrostomy catheter is noted in the medial left mid abdomen, at the expected level of the lower stomach. There is moderate air in the colon. There is no bowel dilatation or air-fluid level to indicate bowel obstruction. There are areas of free air, potentially due to recent laparotomy. There are foci of iliac artery calcification bilaterally. IMPRESSION: No bowel obstruction. Scattered foci of free air noted, likely of postoperative etiology. Gastrostomy catheter in medial left mid abdomen, likely in or overlying stomach. Exact positioning of gastrostomy catheter cannot be determined on single-view examination. Bilateral iliac artery calcification noted. Electronically Signed   By: Lowella Grip III M.D.   On: 10/08/2017 09:59    Procedures Dr. Coralie Keens (10/07/17) -  EXPLORATORY LAPAROTOMY REMOVAL AND REPLACEMENT OF GASTROSTOMY TUBE  Hospital Course:  Clinton Harrington is a 66 y.o. male with recent history of MI s/P CABG and placement of right ventricular assistive device who presented to the [facility] ED from [facility] w/ acute abdmonial pain/distention. CT scan of the abdomen performed on 10/07/17 was significant for extensive pneumoperitoneum of unknown etiology. The patient was given FFP for an elevated INR and taken emergently for the above procedure by Dr. Ninfa Linden where it was discovered that site of the  patients G-tube was leaking. This was repaired as above and the patient tolerated the procedure without complication. Post-operatively the  patients G-tube was kept to gravity and he was kept NPO. He had expected post-operative abdominal soreness around his incision that is worse with movement/coughing.  On 10/09/17 the patients vitals were stable, pain controlled with IV pain medication, and he was stable for discharge to [facility] with plans to resume tube feedings and meds per tube on 10/10/17. He may resume his coumadin from a surgical perspective. He has a midline abdominal incision with staples and staples should be removed on 10/21/17. You may call surgery as needed with questions/concerns.   Physical Exam: General:  Alert, NAD, pleasant, comfortable Abd:  Soft, ND, mild tenderness without peritonitis, G-tube site c/d/i to gravity with small amount bilious output, midline incision c/d/i .   Allergies as of 10/09/2017      Reactions   Aspirin Other (See Comments)   Stomach upset   Latex Rash   Oxycodone-acetaminophen Itching      Medication List    STOP taking these medications   acetaminophen 650 MG CR tablet Commonly known as:  TYLENOL     TAKE these medications   anidulafungin 100 mg in sodium chloride 0.9 % 100 mL Inject 100 mg into the vein daily.   atorvastatin 80 MG tablet Commonly known as:  LIPITOR Place 1 tablet (80 mg total) into feeding tube daily at 6 PM.   brimonidine 0.15 % ophthalmic solution Commonly known as:  ALPHAGAN Place 1 drop into the right eye 2 (two) times daily.   budesonide 0.25 MG/2ML nebulizer solution Commonly known as:  PULMICORT Take 2 mLs (0.25 mg total) by nebulization 2 (two) times daily.   chlorhexidine gluconate (MEDLINE KIT) 0.12 % solution Commonly known as:  PERIDEX 15 mLs by Mouth Rinse route 2 (two) times daily.   Chlorhexidine Gluconate Cloth 2 % Pads Apply 6 each topically daily.   collagenase  ointment Commonly known as:  SANTYL Apply topically daily.   dextrose 5 % and 0.9% NaCl 5-0.9 % infusion Inject 100 mL/hr into the vein continuous.   enoxaparin 40 MG/0.4ML injection Commonly known as:  LOVENOX Inject 0.4 mLs (40 mg total) into the skin daily.   feeding supplement (PRO-STAT SUGAR FREE 64) Liqd Place 30 mLs into feeding tube 3 (three) times daily.   feeding supplement (VITAL 1.5 CAL) Liqd Place 1,000 mLs into feeding tube continuous.   fentaNYL 100 MCG/2ML injection Commonly known as:  SUBLIMAZE Inject 1 mL (50 mcg total) into the vein every 2 (two) hours as needed for moderate pain or severe pain.   furosemide 10 MG/ML injection Commonly known as:  LASIX Inject 4 mLs (40 mg total) into the vein 2 (two) times daily.   insulin aspart 100 UNIT/ML injection Commonly known as:  novoLOG Inject 0-24 Units into the skin every 4 (four) hours.   insulin glargine 100 UNIT/ML injection Commonly known as:  LANTUS Inject 0.3 mLs (30 Units total) into the skin 2 (two) times daily.   ipratropium 0.02 % nebulizer solution Commonly known as:  ATROVENT Take 2.5 mLs (0.5 mg total) by nebulization every 6 (six) hours.   lactobacillus Pack Take 1 packet (1 g total) by mouth 3 (three) times daily with meals.   levalbuterol 0.63 MG/3ML nebulizer solution Commonly known as:  XOPENEX Take 3 mLs (0.63 mg total) by nebulization every 6 (six) hours.   meropenem 1 g in sodium chloride 0.9 % 100 mL Inject 1 g into the vein every 8 (eight) hours.   metoCLOPramide 5 MG tablet Commonly known  as:  REGLAN Take 5 mg by mouth 4 (four) times daily.   metoprolol tartrate 5 MG/5ML Soln injection Commonly known as:  LOPRESSOR Inject 2.5-5 mLs (2.5-5 mg total) into the vein every 2 (two) hours as needed (HR > 130 OR may use prn for SBP > 150 if HR > 70).   midazolam 2 MG/2ML Soln injection Commonly known as:  VERSED Inject 2 mLs (2 mg total) into the vein every 2 (two) hours as needed  for agitation (to maintain RASS goal.).   mouth rinse Liqd solution 15 mLs by Mouth Rinse route QID.   mupirocin cream 2 % Commonly known as:  BACTROBAN Apply topically 2 (two) times daily.   ondansetron 4 MG/2ML Soln injection Commonly known as:  ZOFRAN Inject 2 mLs (4 mg total) into the vein every 6 (six) hours as needed for nausea or vomiting.   oxyCODONE 5 MG immediate release tablet Commonly known as:  Oxy IR/ROXICODONE Take 1 tablet (5 mg total) by mouth every 3 (three) hours as needed for severe pain.   pantoprazole sodium 40 mg/20 mL Pack Commonly known as:  PROTONIX Place 20 mLs (40 mg total) into feeding tube daily.   potassium chloride 10 MEQ/50ML Inject 50 mLs (10 mEq total) into the vein every hour as needed (for K+ < 3.8).   RISA-BID PROBIOTIC PO Take 1 tablet by mouth 3 (three) times daily.   sodium chloride 0.9 % infusion Inject 10 mLs into the vein continuous.   sodium chloride flush 0.9 % Soln Commonly known as:  NS 10-40 mLs by Intracatheter route every 12 (twelve) hours.   sodium chloride flush 0.9 % Soln Commonly known as:  NS 10-40 mLs by Intracatheter route as needed (flush).   sodium chloride flush 0.9 % Soln Commonly known as:  NS Inject 3 mLs into the vein every 12 (twelve) hours.   sodium chloride flush 0.9 % Soln Commonly known as:  NS Inject 3 mLs into the vein as needed.   spironolactone 25 MG tablet Commonly known as:  ALDACTONE Place 1 tablet (25 mg total) into feeding tube daily.   vancomycin 500 mg in sodium chloride 0.9 % 100 mL Inject 500 mg into the vein every 12 (twelve) hours.   warfarin 3 MG tablet Commonly known as:  COUMADIN Take 1 tablet (3 mg total) by mouth one time only at 6 PM. What changed:  how much to take   white petrolatum Oint Commonly known as:  VASELINE Apply 1 application topically 2 (two) times daily.   Zinc Oxide 12.8 % ointment Commonly known as:  TRIPLE PASTE Apply topically as needed for  irritation.          Signed: Obie Dredge, [facility] 10/09/2017, 10:54 AM Pager: [phone] Consults: [phone] Mon-Fri 7:00 am-4:30 pm Sat-Sun 7:00 am-11:30 am

## 2017-10-09 NOTE — Evaluation (Signed)
Speech Language Pathology Evaluation Patient Details Name: Clinton Harrington MRN: [id number] DOB: 01-24-1951 Today's Date: 10/09/2017 Time: 4098-1191 SLP Time Calculation (min) (ACUTE ONLY): 6 min  Problem List:  Patient Active Problem List   Diagnosis Date Noted  . Bowel obstruction (HCC) 10/07/2017  . Gastrojejunostomy tube dislodgement (HCC) 10/07/2017  . Dysphagia   . Acute encephalopathy   . HCAP (healthcare-associated pneumonia)   . Acute respiratory failure with hypoxemia (HCC)   . Acute pulmonary edema (HCC)   . CHF (congestive heart failure) (HCC)   . Heart failure (HCC) 09/08/2017  . Pressure injury of skin 09/06/2017  . Respiratory failure (HCC)   . STEMI (ST elevation myocardial infarction) (HCC) 08/23/2017  . Cardiogenic shock (HCC)   . Diabetic polyneuropathy associated with diabetes mellitus due to underlying condition (HCC) 03/28/2016  . PAD (peripheral artery disease) (HCC) 01/23/2016  . Extremity atherosclerosis with gangrene (HCC) 01/01/2016  . Hx of CABG   . Dyslipidemia, goal LDL below 70   . Atherosclerosis of native arteries of the extremities with gangrene (HCC) 12/29/2015  . Ulcer of heel and midfoot (HCC) 11/09/2015  . Diabetes (HCC) 10/30/2012  . Hyperlipemia 10/30/2012  . CAD (coronary artery disease) 10/30/2012  . HTN (hypertension) 10/30/2012  . Tobacco use 10/30/2012   Past Medical History:  Past Medical History:  Diagnosis Date  . Arthritis   . Diabetes (HCC)   . Diabetes mellitus Jan. 1999  . High cholesterol   . Hypertension   . Myocardial infarct (HCC)   . Peripheral vascular disease (HCC)   . Seasonal allergies   . Smoker   . Urine incontinence    Past Surgical History:  Past Surgical History:  Procedure Laterality Date  . CORONARY ARTERY BYPASS GRAFT N/A 08/28/2017   Procedure: CORONARY ARTERY BYPASS GRAFTING (CABG) times four using left internal mammary artery and right greater saphenous leg vein using endoscope.;  Surgeon:  Kerin Perna, MD;  Location: [facility] OR;  Service: Open Heart Surgery;  Laterality: N/A;  . CORONARY STENT PLACEMENT  Jan.  1999  . FEMORAL-POPLITEAL BYPASS GRAFT Left 01/03/2016   Procedure: LEFT COMMON FEMORAL-BELOW KNEE POPLITEAL ARTERY BYPASS GRAFT USING NON REVERSED TRANSLOCATED SAPHENOUS VEIN GRAFT;  Surgeon: Pryor Ochoa, MD;  Location: [facility] OR;  Service: Vascular;  Laterality: Left;  . IABP INSERTION N/A 08/23/2017   Procedure: IABP Insertion;  Surgeon: Runell Gess, MD;  Location: [facility] INVASIVE CV LAB;  Service: Cardiovascular;  Laterality: N/A;  . INTRAOPERATIVE ARTERIOGRAM Left 01/03/2016   Procedure: INTRA OPERATIVE ARTERIOGRAM LEFT LEG;  Surgeon: Pryor Ochoa, MD;  Location: [facility] OR;  Service: Vascular;  Laterality: Left;  . IR GASTROSTOMY TUBE MOD SED  10/01/2017  . LAPAROTOMY N/A 10/07/2017   Procedure: EXPLORATORY LAPAROTOMY;  Surgeon: Abigail Miyamoto, MD;  Location: [facility] OR;  Service: General;  Laterality: N/A;  . LEFT HEART CATH AND CORS/GRAFTS ANGIOGRAPHY N/A 08/23/2017   Procedure: LEFT HEART CATH AND CORS/GRAFTS ANGIOGRAPHY;  Surgeon: Runell Gess, MD;  Location: [facility] INVASIVE CV LAB;  Service: Cardiovascular;  Laterality: N/A;  . PERIPHERAL VASCULAR CATHETERIZATION N/A 01/01/2016   Procedure: Abdominal Aortogram w/Lower Extremity;  Surgeon: Fransisco Hertz, MD;  Location: [facility] INVASIVE CV LAB;  Service: Cardiovascular;  Laterality: N/A;  . PERIPHERAL VASCULAR CATHETERIZATION Left 01/01/2016   Procedure: Peripheral Vascular Balloon Angioplasty;  Surgeon: Fransisco Hertz, MD;  Location: [facility] INVASIVE CV LAB;  Service: Cardiovascular;  Laterality: Left;  Popliteal artery  . PERIPHERAL VASCULAR CATHETERIZATION N/A 12/04/2016  Procedure: Abdominal Aortogram w/Lower Extremity;  Surgeon: Maeola HarmanBrandon Christopher Cain, MD;  [facility];  Service: Cardiovascular;  Laterality: N/A;  . PERIPHERAL VASCULAR CATHETERIZATION Left 12/04/2016   Procedure: Peripheral Vascular Balloon  Angioplasty;  Surgeon: Maeola HarmanBrandon Christopher Cain, MD;  [facility];  Service: Cardiovascular;  Laterality: Left;  Fem_Pop bypass  . PLACEMENT OF CENTRIMAG VENTRICULAR ASSIST DEVICE Right 08/28/2017   Procedure: PLACEMENT OF CENTRIMAG VENTRICULAR ASSIST DEVICE;  Surgeon: Kerin PernaVan Trigt, Peter, MD;  [facility];  Service: Open Heart Surgery;  Laterality: Right;  . REMOVAL OF CENTRIMAG VENTRICULAR ASSIST DEVICE N/A 09/08/2017   Procedure: REMOVAL OF R-VENTRICULAR ASSIST DEVICE WITH CARDIOPULMONARY BYPASS;  Surgeon: Kerin PernaVan Trigt, Peter, MD;  [facility];  Service: Open Heart Surgery;  Laterality: N/A;  . REMOVAL OF GASTROSTOMY TUBE N/A 10/07/2017   Procedure: REMOVAL AND REPLACEMENT OF GASTROSTOMY TUBE;  Surgeon: Abigail MiyamotoBlackman, Douglas, MD;  Location: [facility] OR;  Service: General;  Laterality: N/A;  . TEE WITHOUT CARDIOVERSION N/A 08/28/2017   Procedure: TRANSESOPHAGEAL ECHOCARDIOGRAM (TEE);  Surgeon: Donata ClayVan Trigt, Theron AristaPeter, MD;  [facility];  Service: Open Heart Surgery;  Laterality: N/A;  . TEE WITHOUT CARDIOVERSION N/A 09/08/2017   Procedure: TRANSESOPHAGEAL ECHOCARDIOGRAM (TEE);  Surgeon: Donata ClayVan Trigt, Theron AristaPeter, MD;  [facility];  Service: Open Heart Surgery;  Laterality: N/A;   HPI:  Pt is a 66 y.o.malewith very complex medical history, including diabetes, hypertension, hyperlipidemia, recent STEMI s/p CABG wtih trach/PEG, and COPD, who was transferred [facility] due to abdominal pain and distention secondary to air in the setting of dislodgment of the g tube. He is now s/p replacement.   Assessment / Plan / Recommendation Clinical Impression  Evaluation was brief as pt said he was tired and closed his eyes, intending to sleep. Using multimodal communication, pt demonstrated orientation to person and location, but not to time and situation. Pt showed basic problem solving by using the calendar on his wall to increase temporal orientation, but still needed Min cues to identify the year.  SLP provided Max cues for reorientation to situation and intellectual awareness. When writing, pt had frequent spelling errors, mostly due to the addition of extra letters. Pt seemed to get frustrated when this was pointed out to him, and indicated that this was a new problem. Pt would benefit from SLP f/u to maximize cognitive and communicative skills.    SLP Assessment  SLP Recommendation/Assessment: Patient needs continued Speech Lanaguage Pathology Services SLP Visit Diagnosis: Cognitive communication deficit (R41.841)    Follow Up Recommendations  LTACH    Frequency and Duration min 2x/week  2 weeks      SLP Evaluation Cognition  Overall Cognitive Status: No family/caregiver present to determine baseline cognitive functioning Arousal/Alertness: Awake/alert Orientation Level: Oriented to person;Oriented to place;Disoriented to time;Disoriented to situation Attention: Sustained Sustained Attention: Appears intact Memory: Impaired Memory Impairment: Decreased recall of new information Awareness: Impaired Awareness Impairment: Intellectual impairment Safety/Judgment: Impaired       Comprehension  Auditory Comprehension Overall Auditory Comprehension: Appears within functional limits for tasks assessed    Expression Expression Primary Mode of Expression: Nonverbal - written Verbal Expression Overall Verbal Expression: Other (comment) (limited assessment given aphonia) Initiation: No impairment Automatic Speech: Name Level of Generative/Spontaneous Verbalization: Sentence Interfering Components: Other (comment) (trach with PMV but no phonation) Non-Verbal Means of Communication: Writing;Other (comment) (mouthing) Written Expression Dominant Hand: Right Written Expression: Exceptions to [facility] LLCWFL Self Formulation Ability: Phrase   Oral / Motor  Motor Speech Overall  Motor Speech: Other (comment) (UTA - aphonia secondary to trach) Intelligibility: Unable to assess (comment)    GO                    Maxcine Ham 10/09/2017, 1:53 PM  Maxcine Ham, M.A. CCC-SLP [phone]

## 2017-10-09 NOTE — Plan of Care (Signed)
Problem: Bowel/Gastric: Goal: Gastrointestinal status for postoperative course will improve Outcome: Not Progressing Pt complains of pain in abdomen, g tube draining to gravity, plan to start tube feedings 11/2.  Problem: Physical Regulation: Goal: Postoperative complications will be avoided or minimized Outcome: Progressing Pain/pressure and abdominal distention.  Problem: Pain Management: Goal: General experience of comfort will improve Outcome: Not Progressing Pain minimally relived with dilaudid on MAR.

## 2017-10-09 NOTE — Progress Notes (Addendum)
PROGRESS NOTE   Clinton Harrington  NWG:[id number]    DOB: Jan 06, 1951    DOA: 10/07/2017  PCP: Johny Blamer, MD   I have briefly reviewed patients previous medical records in [facility].  Brief Narrative:  66 year old male with PMH of CAD status post PCI to the RCA 20 years ago, recent prolonged hospitalization 08/23/17-09/26/17 when he presented with chest pain, dyspnea and hypotension, emergent catheterization showed chronic occlusion of the dominant RCA, LAD with proximal 90% stenosis and circumflex with 75-80 percent stenosis, a balloon pump was placed in the cath lab. He then underwent CABG 4 & placement of a RVAD on 08/28/17. Postop patient was in ICU, intubated for mechanical ventilation, required pressors, amiodarone was added for A. fib and required shock for atrial flutter with hypotension, transfused PRBCs for expected ABLA. He was difficult to wean off the ventilator and tracheostomy placed 09/17/17. [facility] multiple antimicrobials (Anidulafungin, meropenem, vancomycin) for suspected pneumonia and pressure sources possible source of infection. IR placed gastrostomy tube on 10/01/17. He presented to [facility] ED on 10/07/17 with acute abdominal pain. He was emergently taken to the OR by surgery and underwent exploratory laparotomy, removal and replacement of gastrostomy tube because the G-tube was leaking. TRH assisted with medical management. Patient being discharged by primary service/CCS back to [facility] for ongoing care.   Assessment & Plan:   Active Problems:   Diabetes (HCC)   Hyperlipemia   CAD (coronary artery disease)   HTN (hypertension)   Tobacco use   PAD (peripheral artery disease) (HCC)   Diabetic polyneuropathy associated with diabetes mellitus due to underlying condition (HCC)   STEMI (ST elevation myocardial infarction) (HCC)   Cardiogenic shock (HCC)   Respiratory failure (HCC)   Pressure injury of skin   CHF  (congestive heart failure) (HCC)   Dysphagia   Gastrojejunostomy tube dislodgement (HCC)   1. Acute abdominal pain due to dislodged gastrostomy tube: General surgery was consulted and patient underwent emergent exploratory laparotomy, removal and replacement of gastrostomy tube on 10/07/17. INR was reversed with FFP prior to procedure. General surgery is [facility] and are recommending resuming all medications but hold tube feeds until 10/10/17. Surgery has also cleared to resume Coumadin. Surgical staples to be removed on 10/21/17. 2. CAD status post CABG: Continue aspirin (not seen on admission medications), statins. History of post cardiotomy cardiogenic shock during recent hospitalization attributed primarily to RV failure from inferior infarct with RV involvement. Now status post CABG and IABP removed 9/24 & Centrimag removed 10/1. 2-D echo 09/17/17: LVEF 60-65 percent, RV with some improvement/less dilated. Continue Lasix and Aldactone. Consider Cardiology consultation and follow-up at [facility]. 3. Atrial fibrillation/flutter: DCCV 9/29. Remains in sinus rhythm. General surgery has cleared to resume Coumadin. Does not seem to be on amiodarone any longer. Currently on metoprolol. 4. Chronic hypoxic respiratory failure: Status post tracheostomy 09/17/17. Currently on trach collar at 35% FiO2. Stable. Chest x-ray 10/31 personally reviewed. Seems like some pulmonary edema. Also report of layering left pleural effusion and left base consolidation. 5. Recent presumed septic/vasogenic shock, RLL pneumonia/HCAP: He was discharged from the hospital to [facility] on vancomycin, meropenem and Anidulafungin. The duration of these anti-microbials is not clear and to be followed closely at [facility] and DC when appropriate. May consider ID consultation. Most recent respiratory cultures, blood cultures and urine cultures between 10/11-10/13 are negative. 6. Multiple wounds: On sacrum and lower  extremities. Management per wound care consultation. 7.  Normocytic anemia of chronic disease: Stable. 8. Type II DM: Reasonable inpatient control with SSI only while on IV D5 infusion until tube feeds are resumed. May continue current regimen and restart Lantus when tolerating tube feeds well. 9. Urinary retention: Has indwelling Foley catheter since recent hospital discharge. 10. Deconditioning: [facility] 11. COPD: Stable without clinical bronchospasm. 12. Stage II chronic kidney disease: Creatinine normal. 13. Hyperlipidemia: Continue statins.   DVT prophylaxis: Lovenox. INR on 10/30:2.58. Code Status: Full Family Communication: None at bedside Disposition: DC to LTAC. I have discussed with case management on the floor and patient's RN to insure that my progress note goes along with patient to LTAC.   Consultants:  General surgery were primary TRH were consultants   Procedures:  As above  Antimicrobials:  As above    Subjective: Seen this morning. Reported appropriate postop abdominal soreness. Passing flatus. No BM for 3 days. Denied dyspnea, cough or chest pain. As per RN, apart from abdominal pain, no other acute issues reported.   ROS: As above.  Objective:  Vitals:   10/09/17 0800 10/09/17 0803 10/09/17 1201 10/09/17 1249  BP:   (!) 145/73 (!) 145/73  Pulse:    90  Resp:   17 16  Temp: 98.2 F (36.8 C) 98.2 F (36.8 C)  97.7 F (36.5 C)  TempSrc: Oral Oral  Oral  SpO2:    91%  Weight:      Height:        Examination:  General exam: Middle-aged male, moderately built and nourished, lying comfortably propped up in bed. Respiratory system: Clear to auscultation. Respiratory effort normal. Breathing spontaneously via tracheostomy with trach collar at 35% FiO2. Cardiovascular system: S1 & S2 heard, RRR. No JVD, murmurs, rubs, gallops or clicks. Trace bilateral ankle edema. Telemetry personally reviewed: Sinus rhythm. Gastrointestinal  system: Abdomen is nondistended, soft. Gastrostomy tube dressing clean and dry. Diffuse mild and appropriate postop tenderness without rigidity, guarding or rebound. No organomegaly or masses felt. Normal bowel sounds heard. Central nervous system: Alert and oriented. No focal neurological deficits. Extremities: Symmetric 5 x 5 power. Skin: Dressings over multiple bilateral leg wounds clean and dry. Did not assess sacral wound today. Psychiatry: Judgement and insight appear normal. Mood & affect appropriate.     Data Reviewed: I have personally reviewed following labs and imaging studies  CBC:  Recent Labs Lab 10/03/17 0645 10/04/17 0725 10/05/17 0513 10/06/17 0728 10/07/17 1345  WBC 8.0 7.4 8.1 8.6 8.2  NEUTROABS  --   --   --   --  6.3  HGB 9.7* 9.3* 10.1* 10.3* 10.4*  HCT 30.2* 29.7* 32.3* 32.6* 32.5*  MCV 89.9 89.5 89.2 88.6 88.6  PLT 310 318 355 382 334   Basic Metabolic Panel:  Recent Labs Lab 10/04/17 0725 10/05/17 0513 10/06/17 0728 10/07/17 0638 10/08/17 0623  NA 128* 133* 131* 133* 135  K 5.3* 3.9 4.1 3.8 4.3  CL 97* 97* 97* 99* 100*  CO2 23 26 25 24 27   GLUCOSE 260* 97 123* 105* 113*  BUN 33* 36* 46* 41* 32*  CREATININE 1.03 1.01 1.17 1.02 0.91  CALCIUM 8.2* 8.3* 8.3* 8.2* 8.3*   Liver Function Tests:  Recent Labs Lab 10/03/17 0645 10/04/17 0725 10/05/17 0513 10/06/17 0728 10/08/17 0623  AST 43* 40 71* 50* 22  ALT 42 40 59 48 30  ALKPHOS 125 122 134* 134* 119  BILITOT 0.7 0.6 0.6 0.7 0.6  PROT 7.3 7.0 7.4 7.5 7.0  ALBUMIN 2.0* 1.9* 2.0* 2.2* 2.2*   Coagulation Profile:  Recent Labs Lab 10/03/17 0645 10/04/17 0725 10/05/17 0513 10/06/17 0728 10/07/17 0638  INR 2.43 3.29 2.03 1.95 2.58   Cardiac Enzymes:  Recent Labs Lab 10/07/17 1545  TROPONINI <0.03   HbA1C: No results for input(s): HGBA1C in the last 72 hours. CBG:  Recent Labs Lab 10/08/17 1935 10/08/17 2045 10/08/17 2322 10/09/17 0345 10/09/17 0801  GLUCAP 127*  118* 123* 119* 125*    Recent Results (from the past 240 hour(s))  MRSA PCR Screening     Status: None   Collection Time: 10/07/17  8:50 PM  Result Value Ref Range Status   MRSA by PCR NEGATIVE NEGATIVE Final    Comment:        The GeneXpert MRSA Assay (FDA approved for NASAL specimens only), is one component of a comprehensive MRSA colonization surveillance program. It is not intended to diagnose MRSA infection nor to guide or monitor treatment for MRSA infections.          Radiology Studies: X-ray Chest Pa And Lateral  Result Date: 10/08/2017 CLINICAL DATA:  Difficulty breathing.  Tracheostomy present EXAM: CHEST  2 VIEW COMPARISON:  September 27, 2017 FINDINGS: Tracheostomy catheter tip is 4.2 cm above the carina. There is no appreciable pneumothorax. There is a layering pleural effusion on the left with left base consolidation. Right lung is clear. Heart is mildly enlarged with pulmonary vascularity within normal limits. Patient is status post coronary artery bypass grafting. There is aortic atherosclerosis. No adenopathy. No evident bone lesions. IMPRESSION: Tracheostomy as described without pneumothorax. Layering effusion on the left with left base consolidation. Right lung clear. Stable cardiac prominence. There is aortic atherosclerosis. Aortic Atherosclerosis (ICD10-I70.0). Electronically Signed   By: Bretta Bang III M.D.   On: 10/08/2017 09:56   Abd 1 View (kub)  Result Date: 10/08/2017 CLINICAL DATA:  Status post laparotomy. Gastrostomy catheter present EXAM: ABDOMEN - 1 VIEW COMPARISON:  CT abdomen and pelvis October 07, 2017 FINDINGS: Gastrostomy catheter is noted in the medial left mid abdomen, at the expected level of the lower stomach. There is moderate air in the colon. There is no bowel dilatation or air-fluid level to indicate bowel obstruction. There are areas of free air, potentially due to recent laparotomy. There are foci of iliac artery calcification  bilaterally. IMPRESSION: No bowel obstruction. Scattered foci of free air noted, likely of postoperative etiology. Gastrostomy catheter in medial left mid abdomen, likely in or overlying stomach. Exact positioning of gastrostomy catheter cannot be determined on single-view examination. Bilateral iliac artery calcification noted. Electronically Signed   By: Bretta Bang III M.D.   On: 10/08/2017 09:59        Scheduled Meds: . budesonide  0.25 mg Nebulization BID  . chlorhexidine  15 mL Mouth Rinse BID  . collagenase   Topical Daily  . enoxaparin (LOVENOX) injection  40 mg Subcutaneous Q24H  . insulin aspart  0-9 Units Subcutaneous Q4H  . mouth rinse  15 mL Mouth Rinse q12n4p   Continuous Infusions: . dextrose 5 % and 0.9% NaCl 100 mL/hr at 10/09/17 1231     LOS: 2 days     Rebie Peale, MD, FACP, FHM. [facility] Pager [phone]  If 7PM-7AM, please contact night-coverage [email or web address] Password [email or web address] 10/09/2017, 12:50 PM

## 2017-10-09 NOTE — Evaluation (Signed)
Physical Therapy Evaluation Patient Details Name: Clinton HammockReginald E Francois MRN: [id number] DOB: May 04, 1951 Today's Date: 10/09/2017   History of Present Illness  Pt is a 66 y.o.malewith very complex medical history, including diabetes, hypertension, hyperlipidemia, recent STEMI s/p CABG wtih trach/PEG, and COPD, who was [facility] due to abdominal pain and distention secondary to air in the setting of dislodgment of the g tube. He is now s/p replacement.  Clinical Impression  Pt admitted with above diagnosis. Pt currently with functional limitations due to the deficits listed below (see PT Problem List). Very limited eval as pt agreed to exercise only and did not want to sit EOB today.  Weakness noted and will benefit fromcontinued LTAC therapy.  Will follow acutely.  Pt will benefit from skilled PT to increase their independence and safety with mobility to allow discharge to the venue listed below.      Follow Up Recommendations Supervision/Assistance - 24 hour;LTACH    Equipment Recommendations   (TBD)    Recommendations for Other Services       Precautions / Restrictions Precautions Precautions: Sternal Precaution Comments: trach 40%, sternal incision has healed Restrictions Weight Bearing Restrictions: No      Mobility  Bed Mobility               General bed mobility comments: Pt refused to sit on EOB today.  Agreed to bed level eval.  Did sit in chair position which pt tolerated.   Transfers                    Ambulation/Gait                Stairs            Wheelchair Mobility    Modified Rankin (Stroke Patients Only)       Balance                                             Pertinent Vitals/Pain Pain Assessment: Faces Faces Pain Scale: Hurts whole lot Pain Location: grimaces, does not specify Pain Descriptors / Indicators: Grimacing Pain Intervention(s): Limited activity within patient's  tolerance;Monitored during session;Repositioned    Home Living Family/patient expects to be discharged to::  (LTAC)     Type of Home: Other(Comment) [facility])           Additional Comments: pt lives with mother who had dementia in a 1 story home with one step to enter, has walk in shower    Prior Function Level of Independence: Independent         Comments: 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     Hand Dominance   Dominant Hand: Right    Extremity/Trunk Assessment   Upper Extremity Assessment Upper Extremity Assessment: Defer to OT evaluation    Lower Extremity Assessment Lower Extremity Assessment: RLE deficits/detail;LLE deficits/detail RLE Deficits / Details: grossly 2-/5 LLE Deficits / Details: grossly 2+/5    Cervical / Trunk Assessment Cervical / Trunk Assessment:  (sternal incision)  Communication   Communication: Tracheostomy  Cognition Arousal/Alertness: Awake/alert Behavior During Therapy: Anxious Overall Cognitive Status: No family/caregiver present to determine baseline cognitive functioning Area of Impairment: Following commands;Problem solving                       Following Commands: Follows one step commands with  increased time;Follows one step commands inconsistently     Problem Solving: Slow processing;Decreased initiation;Difficulty sequencing;Requires verbal cues;Requires tactile cues        General Comments General comments (skin integrity, edema, etc.): 40% trach collar with VSS.      Exercises General Exercises - Upper Extremity Shoulder Flexion: AROM;AAROM;Both;Seated;10 reps (able to lift R arm 6 inches and L arm 2 inches) Shoulder Horizontal ABduction: AROM;AAROM;Both;10 reps;Supine Wrist Flexion: AROM;Both;10 reps;Supine General Exercises - Lower Extremity Ankle Circles/Pumps: AROM;Both;10 reps;Supine Quad Sets: AROM;Both;10 reps;Supine Heel Slides: AAROM;Both;Supine;10 reps Hip ABduction/ADduction:  AROM;AAROM;Both;10 reps;Supine   Assessment/Plan    PT Assessment Patient needs continued PT services  PT Problem List Decreased strength;Decreased range of motion;Decreased activity tolerance;Decreased balance;Decreased mobility;Decreased coordination;Decreased cognition;Decreased knowledge of use of DME;Decreased safety awareness;Decreased knowledge of precautions;Cardiopulmonary status limiting activity;Pain       PT Treatment Interventions DME instruction;Gait training;Stair training;Functional mobility training;Therapeutic activities;Therapeutic exercise;Balance training;Cognitive remediation;Neuromuscular re-education;Patient/family education    PT Goals (Current goals can be found in the Care Plan section)  Acute Rehab PT Goals Patient Stated Goal: didn't state PT Goal Formulation: With patient Time For Goal Achievement: 10/23/17 Potential to Achieve Goals: Good    Frequency Min 2X/week   Barriers to discharge Decreased caregiver support      Co-evaluation               AM-PAC PT "6 Clicks" Daily Activity  Outcome Measure Difficulty turning over in bed (including adjusting bedclothes, sheets and blankets)?: Unable Difficulty moving from lying on back to sitting on the side of the bed? : Unable Difficulty sitting down on and standing up from a chair with arms (e.g., wheelchair, bedside commode, etc,.)?: Unable Help needed moving to and from a bed to chair (including a wheelchair)?: Total Help needed walking in hospital room?: Total Help needed climbing 3-5 steps with a railing? : Total 6 Click Score: 6    End of Session Equipment Utilized During Treatment: Oxygen (trach 40%) Activity Tolerance: Patient limited by fatigue;Patient limited by pain Patient left: with call bell/phone within reach;in bed;with bed alarm set Nurse Communication: Mobility status;Need for lift equipment PT Visit Diagnosis: Unsteadiness on feet (R26.81);Muscle weakness (generalized)  (M62.81);Difficulty in walking, not elsewhere classified (R26.2);Other abnormalities of gait and mobility (R26.89)    Time: 8119-1478 PT Time Calculation (min) (ACUTE ONLY): 12 min   Charges:   PT Evaluation $PT Eval Moderate Complexity: 1 Mod     PT G Codes:        Dash Cardarelli,PT Acute Rehabilitation [phone] (pager)   Berline Lopes 10/09/2017, 2:13 PM

## 2017-10-09 NOTE — Progress Notes (Signed)
  Speech Language Pathology Treatment: Hillary BowPassy Muir Speaking valve  Patient Details Name: Clinton HammockReginald E Harrington MRN: [id number] DOB: 08/28/1951 Today's Date: 10/09/2017 Time: [id number] SLP Time Calculation (min) (ACUTE ONLY): 8 min  Assessment / Plan / Recommendation Clinical Impression  Pt has less audible secretions today and his cuff is deflated at baseline. PMV was placed with no overt signs of intolerance for 6 minutes. He is able to blow air through his oral cavity, but still no phonation is produced despite cueing. PMV was removed when pt closed his eyes and indicated that he was going to sleep. I anticipate that he can wear the valve for longer periods of time, but until this can be established, I would recommend continued use with SLP only.   HPI HPI: Pt is a 66 y.o.malewith very complex medical history, including diabetes, hypertension, hyperlipidemia, recent STEMI s/p CABG wtih trach/PEG, and COPD, who [facility] pain and distention secondary to air in the setting of dislodgment of the g tube. He is now s/p replacement.      SLP Plan  Continue with current plan of care       Recommendations         Patient may use Passy-Muir Speech Valve: with SLP only MD: Please consider changing trach tube to : Smaller size;Cuffless         Follow up Recommendations: LTACH SLP Visit Diagnosis: Aphonia (R49.1) Plan: Continue with current plan of care       GO                Maxcine Hamaiewonsky, Gregoire Bennis 10/09/2017, 1:41 PM  Maxcine HamLaura Paiewonsky, M.A. CCC-SLP [phone]

## 2017-10-09 NOTE — Consult Note (Signed)
   [facility]   10/09/2017  Clinton HammockReginald E Harrington 02-06-1951 [id number]   Patient assessed for re-admission in the [facility].  [facility]. Patient is Clinton LarsenReginald E Harrington a 66 y.o.malewith recent history of MI s/P CABG and placement of right ventricular assistive device [facility] w/ acute abdmonial pain/distention. CT scan of the abdomen performed on 10/07/17 was significant for extensive pneumoperitoneum of unknown etiology per MD notes. [facility] when medically [facility].  Charlesetta ShanksVictoria Damary Doland, RN BSN CCM [facility] Lia[facility]  [phone] business mobile phone Toll free office [phone]

## 2017-10-10 LAB — CBC WITH DIFFERENTIAL/PLATELET
BASOS PCT: 0 %
Basophils Absolute: 0 10*3/uL (ref 0.0–0.1)
Eosinophils Absolute: 0.2 10*3/uL (ref 0.0–0.7)
Eosinophils Relative: 2 %
HEMATOCRIT: 32 % — AB (ref 39.0–52.0)
HEMOGLOBIN: 9.8 g/dL — AB (ref 13.0–17.0)
LYMPHS ABS: 1 10*3/uL (ref 0.7–4.0)
Lymphocytes Relative: 15 %
MCH: 27.5 pg (ref 26.0–34.0)
MCHC: 30.6 g/dL (ref 30.0–36.0)
MCV: 89.6 fL (ref 78.0–100.0)
MONO ABS: 0.3 10*3/uL (ref 0.1–1.0)
MONOS PCT: 5 %
NEUTROS ABS: 5.3 10*3/uL (ref 1.7–7.7)
NEUTROS PCT: 78 %
Platelets: 363 10*3/uL (ref 150–400)
RBC: 3.57 MIL/uL — ABNORMAL LOW (ref 4.22–5.81)
RDW: 15.3 % (ref 11.5–15.5)
WBC: 6.8 10*3/uL (ref 4.0–10.5)

## 2017-10-10 LAB — PROTIME-INR
INR: 5.03 — AB
Prothrombin Time: 46.3 seconds — ABNORMAL HIGH (ref 11.4–15.2)

## 2017-10-11 ENCOUNTER — Other Ambulatory Visit (HOSPITAL_COMMUNITY): Payer: Self-pay

## 2017-10-11 DIAGNOSIS — R109 Unspecified abdominal pain: Secondary | ICD-10-CM | POA: Diagnosis not present

## 2017-10-11 LAB — PROTIME-INR
INR: 5.36 — AB
PROTHROMBIN TIME: 47.7 s — AB (ref 11.4–15.2)

## 2017-10-12 LAB — PROTIME-INR
INR: 5.8
PROTHROMBIN TIME: 51.7 s — AB (ref 11.4–15.2)

## 2017-10-13 LAB — PROTIME-INR
INR: 1.4
INR: 1.25
PROTHROMBIN TIME: 15.6 s — AB (ref 11.4–15.2)
Prothrombin Time: 17 seconds — ABNORMAL HIGH (ref 11.4–15.2)

## 2017-10-13 LAB — CBC
HCT: 30.7 % — ABNORMAL LOW (ref 39.0–52.0)
Hemoglobin: 9.6 g/dL — ABNORMAL LOW (ref 13.0–17.0)
MCH: 27.5 pg (ref 26.0–34.0)
MCHC: 31.3 g/dL (ref 30.0–36.0)
MCV: 88 fL (ref 78.0–100.0)
Platelets: 294 10*3/uL (ref 150–400)
RBC: 3.49 MIL/uL — ABNORMAL LOW (ref 4.22–5.81)
RDW: 15.7 % — ABNORMAL HIGH (ref 11.5–15.5)
WBC: 6.7 10*3/uL (ref 4.0–10.5)

## 2017-10-13 LAB — BASIC METABOLIC PANEL
ANION GAP: 9 (ref 5–15)
BUN: 19 mg/dL (ref 6–20)
CALCIUM: 8.2 mg/dL — AB (ref 8.9–10.3)
CHLORIDE: 103 mmol/L (ref 101–111)
CO2: 25 mmol/L (ref 22–32)
Creatinine, Ser: 0.64 mg/dL (ref 0.61–1.24)
GFR calc Af Amer: >60 mL/min (ref >60)
GFR calc non Af Amer: >60 mL/min (ref >60)
GLUCOSE: 174 mg/dL — AB (ref 65–99)
Potassium: 3.1 mmol/L — ABNORMAL LOW (ref 3.5–5.1)
Sodium: 137 mmol/L (ref 135–145)

## 2017-10-14 LAB — PROTIME-INR
INR: 1.22
PROTHROMBIN TIME: 15.3 s — AB (ref 11.4–15.2)

## 2017-10-15 LAB — PROTIME-INR
INR: 1.2
Prothrombin Time: 15.1 seconds (ref 11.4–15.2)

## 2017-10-16 ENCOUNTER — Other Ambulatory Visit (HOSPITAL_COMMUNITY): Payer: Self-pay

## 2017-10-16 LAB — PROTIME-INR
INR: 1.38
Prothrombin Time: 16.8 seconds — ABNORMAL HIGH (ref 11.4–15.2)

## 2017-10-17 ENCOUNTER — Other Ambulatory Visit (HOSPITAL_COMMUNITY): Payer: Self-pay

## 2017-10-17 DIAGNOSIS — Z4682 Encounter for fitting and adjustment of non-vascular catheter: Secondary | ICD-10-CM | POA: Diagnosis not present

## 2017-10-17 LAB — CBC
HEMATOCRIT: 32.2 % — AB (ref 39.0–52.0)
HEMOGLOBIN: 9.8 g/dL — AB (ref 13.0–17.0)
MCH: 26.9 pg (ref 26.0–34.0)
MCHC: 30.4 g/dL (ref 30.0–36.0)
MCV: 88.5 fL (ref 78.0–100.0)
Platelets: 265 10*3/uL (ref 150–400)
RBC: 3.64 MIL/uL — AB (ref 4.22–5.81)
RDW: 15.9 % — ABNORMAL HIGH (ref 11.5–15.5)
WBC: 9.3 10*3/uL (ref 4.0–10.5)

## 2017-10-17 LAB — BASIC METABOLIC PANEL
Anion gap: 10 (ref 5–15)
BUN: 26 mg/dL — AB (ref 6–20)
CHLORIDE: 97 mmol/L — AB (ref 101–111)
CO2: 29 mmol/L (ref 22–32)
Calcium: 8.6 mg/dL — ABNORMAL LOW (ref 8.9–10.3)
Creatinine, Ser: 0.63 mg/dL (ref 0.61–1.24)
GFR calc Af Amer: >60 mL/min (ref >60)
GLUCOSE: 247 mg/dL — AB (ref 65–99)
POTASSIUM: 3.6 mmol/L (ref 3.5–5.1)
Sodium: 136 mmol/L (ref 135–145)

## 2017-10-17 LAB — URINALYSIS, ROUTINE W REFLEX MICROSCOPIC
Bilirubin Urine: NEGATIVE
Glucose, UA: 150 mg/dL — AB
Hgb urine dipstick: NEGATIVE
Ketones, ur: NEGATIVE mg/dL
LEUKOCYTES UA: NEGATIVE
Nitrite: NEGATIVE
PROTEIN: NEGATIVE mg/dL
Specific Gravity, Urine: 1.024 (ref 1.005–1.030)
pH: 5 (ref 5.0–8.0)

## 2017-10-17 LAB — PROTIME-INR
INR: 1.54
PROTHROMBIN TIME: 18.3 s — AB (ref 11.4–15.2)

## 2017-10-18 LAB — COMPREHENSIVE METABOLIC PANEL
ALT: 21 U/L (ref 17–63)
AST: 31 U/L (ref 15–41)
Albumin: 2.2 g/dL — ABNORMAL LOW (ref 3.5–5.0)
Alkaline Phosphatase: 109 U/L (ref 38–126)
Anion gap: 8 (ref 5–15)
BUN: 24 mg/dL — ABNORMAL HIGH (ref 6–20)
CHLORIDE: 94 mmol/L — AB (ref 101–111)
CO2: 29 mmol/L (ref 22–32)
Calcium: 8.3 mg/dL — ABNORMAL LOW (ref 8.9–10.3)
Creatinine, Ser: 0.65 mg/dL (ref 0.61–1.24)
Glucose, Bld: 202 mg/dL — ABNORMAL HIGH (ref 65–99)
POTASSIUM: 4.1 mmol/L (ref 3.5–5.1)
SODIUM: 131 mmol/L — AB (ref 135–145)
Total Bilirubin: 0.5 mg/dL (ref 0.3–1.2)
Total Protein: 6.4 g/dL — ABNORMAL LOW (ref 6.5–8.1)

## 2017-10-18 LAB — PROTIME-INR
INR: 2.38
PROTHROMBIN TIME: 25.8 s — AB (ref 11.4–15.2)

## 2017-10-18 LAB — CBC WITH DIFFERENTIAL/PLATELET
BASOS ABS: 0 10*3/uL (ref 0.0–0.1)
Basophils Relative: 0 %
EOS ABS: 0.2 10*3/uL (ref 0.0–0.7)
EOS PCT: 2 %
HCT: 30.3 % — ABNORMAL LOW (ref 39.0–52.0)
Hemoglobin: 9.6 g/dL — ABNORMAL LOW (ref 13.0–17.0)
Lymphocytes Relative: 14 %
Lymphs Abs: 1.3 10*3/uL (ref 0.7–4.0)
MCH: 27.7 pg (ref 26.0–34.0)
MCHC: 31.7 g/dL (ref 30.0–36.0)
MCV: 87.6 fL (ref 78.0–100.0)
Monocytes Absolute: 0.3 10*3/uL (ref 0.1–1.0)
Monocytes Relative: 3 %
Neutro Abs: 7.5 10*3/uL (ref 1.7–7.7)
Neutrophils Relative %: 81 %
PLATELETS: 255 10*3/uL (ref 150–400)
RBC: 3.46 MIL/uL — AB (ref 4.22–5.81)
RDW: 15.9 % — ABNORMAL HIGH (ref 11.5–15.5)
WBC: 9.3 10*3/uL (ref 4.0–10.5)

## 2017-10-18 LAB — AMYLASE: AMYLASE: 92 U/L (ref 28–100)

## 2017-10-19 LAB — PROTIME-INR
INR: 2.53
PROTHROMBIN TIME: 27.1 s — AB (ref 11.4–15.2)

## 2017-10-20 ENCOUNTER — Other Ambulatory Visit: Payer: Self-pay | Admitting: Cardiothoracic Surgery

## 2017-10-20 DIAGNOSIS — I25119 Atherosclerotic heart disease of native coronary artery with unspecified angina pectoris: Secondary | ICD-10-CM

## 2017-10-20 LAB — BASIC METABOLIC PANEL
ANION GAP: 9 (ref 5–15)
BUN: 22 mg/dL — AB (ref 6–20)
CO2: 29 mmol/L (ref 22–32)
Calcium: 8.5 mg/dL — ABNORMAL LOW (ref 8.9–10.3)
Chloride: 95 mmol/L — ABNORMAL LOW (ref 101–111)
Creatinine, Ser: 0.58 mg/dL — ABNORMAL LOW (ref 0.61–1.24)
GFR calc Af Amer: >60 mL/min (ref >60)
GFR calc non Af Amer: >60 mL/min (ref >60)
GLUCOSE: 172 mg/dL — AB (ref 65–99)
POTASSIUM: 4 mmol/L (ref 3.5–5.1)
Sodium: 133 mmol/L — ABNORMAL LOW (ref 135–145)

## 2017-10-20 LAB — CBC
HEMATOCRIT: 30.4 % — AB (ref 39.0–52.0)
HEMOGLOBIN: 9.7 g/dL — AB (ref 13.0–17.0)
MCH: 27.6 pg (ref 26.0–34.0)
MCHC: 31.9 g/dL (ref 30.0–36.0)
MCV: 86.4 fL (ref 78.0–100.0)
Platelets: 289 10*3/uL (ref 150–400)
RBC: 3.52 MIL/uL — ABNORMAL LOW (ref 4.22–5.81)
RDW: 16 % — ABNORMAL HIGH (ref 11.5–15.5)
WBC: 7.9 10*3/uL (ref 4.0–10.5)

## 2017-10-20 LAB — PROTIME-INR
INR: 2.59
Prothrombin Time: 27.5 seconds — ABNORMAL HIGH (ref 11.4–15.2)

## 2017-10-21 LAB — PROTIME-INR
INR: 2.22
Prothrombin Time: 24.5 seconds — ABNORMAL HIGH (ref 11.4–15.2)

## 2017-10-22 ENCOUNTER — Ambulatory Visit: Payer: Self-pay | Admitting: Cardiothoracic Surgery

## 2017-10-22 LAB — PROTIME-INR
INR: 1.97
Prothrombin Time: 22.3 seconds — ABNORMAL HIGH (ref 11.4–15.2)

## 2017-10-23 ENCOUNTER — Other Ambulatory Visit (HOSPITAL_COMMUNITY): Payer: Self-pay

## 2017-10-23 DIAGNOSIS — R918 Other nonspecific abnormal finding of lung field: Secondary | ICD-10-CM | POA: Diagnosis not present

## 2017-10-23 LAB — BASIC METABOLIC PANEL
Anion gap: 7 (ref 5–15)
BUN: 23 mg/dL — ABNORMAL HIGH (ref 6–20)
CALCIUM: 8.1 mg/dL — AB (ref 8.9–10.3)
CO2: 26 mmol/L (ref 22–32)
CREATININE: 0.71 mg/dL (ref 0.61–1.24)
Chloride: 99 mmol/L — ABNORMAL LOW (ref 101–111)
Glucose, Bld: 178 mg/dL — ABNORMAL HIGH (ref 65–99)
Potassium: 3.9 mmol/L (ref 3.5–5.1)
SODIUM: 132 mmol/L — AB (ref 135–145)

## 2017-10-23 LAB — CBC WITH DIFFERENTIAL/PLATELET
BASOS ABS: 0 10*3/uL (ref 0.0–0.1)
BASOS PCT: 0 %
EOS ABS: 0.2 10*3/uL (ref 0.0–0.7)
Eosinophils Relative: 2 %
HCT: 28.6 % — ABNORMAL LOW (ref 39.0–52.0)
HEMOGLOBIN: 9 g/dL — AB (ref 13.0–17.0)
Lymphocytes Relative: 11 %
Lymphs Abs: 0.9 10*3/uL (ref 0.7–4.0)
MCH: 27.4 pg (ref 26.0–34.0)
MCHC: 31.5 g/dL (ref 30.0–36.0)
MCV: 87.2 fL (ref 78.0–100.0)
Monocytes Absolute: 0.6 10*3/uL (ref 0.1–1.0)
Monocytes Relative: 7 %
Neutro Abs: 6.8 10*3/uL (ref 1.7–7.7)
Neutrophils Relative %: 80 %
Platelets: 363 10*3/uL (ref 150–400)
RBC: 3.28 MIL/uL — AB (ref 4.22–5.81)
RDW: 16.1 % — ABNORMAL HIGH (ref 11.5–15.5)
WBC: 8.5 10*3/uL (ref 4.0–10.5)

## 2017-10-23 LAB — EXPECTORATED SPUTUM ASSESSMENT W GRAM STAIN, RFLX TO RESP C

## 2017-10-23 LAB — LACTIC ACID, PLASMA: LACTIC ACID, VENOUS: 1.5 mmol/L (ref 0.5–1.9)

## 2017-10-23 LAB — EXPECTORATED SPUTUM ASSESSMENT W REFEX TO RESP CULTURE

## 2017-10-23 LAB — PROTIME-INR
INR: 2.1
Prothrombin Time: 23.4 seconds — ABNORMAL HIGH (ref 11.4–15.2)

## 2017-10-24 LAB — PROTIME-INR
INR: 2.53
Prothrombin Time: 27.1 seconds — ABNORMAL HIGH (ref 11.4–15.2)

## 2017-10-25 LAB — PROTIME-INR
INR: 2.93
PROTHROMBIN TIME: 30.3 s — AB (ref 11.4–15.2)

## 2017-10-25 LAB — CULTURE, RESPIRATORY W GRAM STAIN

## 2017-10-25 LAB — CULTURE, RESPIRATORY: CULTURE: NORMAL

## 2017-10-25 LAB — VANCOMYCIN, TROUGH: VANCOMYCIN TR: 30 ug/mL — AB (ref 15–20)

## 2017-10-26 LAB — PROTIME-INR
INR: 3.1
Prothrombin Time: 31.7 seconds — ABNORMAL HIGH (ref 11.4–15.2)

## 2017-10-26 LAB — VANCOMYCIN, TROUGH: Vancomycin Tr: 36 ug/mL (ref 15–20)

## 2017-10-27 ENCOUNTER — Other Ambulatory Visit (HOSPITAL_COMMUNITY): Payer: Self-pay

## 2017-10-27 DIAGNOSIS — R0602 Shortness of breath: Secondary | ICD-10-CM | POA: Diagnosis not present

## 2017-10-27 LAB — CBC
HEMATOCRIT: 27.9 % — AB (ref 39.0–52.0)
Hemoglobin: 8.7 g/dL — ABNORMAL LOW (ref 13.0–17.0)
MCH: 26.9 pg (ref 26.0–34.0)
MCHC: 31.2 g/dL (ref 30.0–36.0)
MCV: 86.1 fL (ref 78.0–100.0)
Platelets: 452 10*3/uL — ABNORMAL HIGH (ref 150–400)
RBC: 3.24 MIL/uL — ABNORMAL LOW (ref 4.22–5.81)
RDW: 16.1 % — AB (ref 11.5–15.5)
WBC: 5.9 10*3/uL (ref 4.0–10.5)

## 2017-10-27 LAB — BASIC METABOLIC PANEL
Anion gap: 7 (ref 5–15)
BUN: 12 mg/dL (ref 6–20)
CALCIUM: 7.8 mg/dL — AB (ref 8.9–10.3)
CO2: 23 mmol/L (ref 22–32)
CREATININE: 1.06 mg/dL (ref 0.61–1.24)
Chloride: 103 mmol/L (ref 101–111)
GFR calc Af Amer: >60 mL/min (ref >60)
GFR calc non Af Amer: >60 mL/min (ref >60)
GLUCOSE: 285 mg/dL — AB (ref 65–99)
Potassium: 3.1 mmol/L — ABNORMAL LOW (ref 3.5–5.1)
Sodium: 133 mmol/L — ABNORMAL LOW (ref 135–145)

## 2017-10-27 LAB — PROTIME-INR
INR: 3.82
Prothrombin Time: 37.3 seconds — ABNORMAL HIGH (ref 11.4–15.2)

## 2017-10-27 LAB — VANCOMYCIN, TROUGH: Vancomycin Tr: 36 ug/mL (ref 15–20)

## 2017-10-28 ENCOUNTER — Other Ambulatory Visit (HOSPITAL_COMMUNITY): Payer: Self-pay

## 2017-10-28 DIAGNOSIS — J90 Pleural effusion, not elsewhere classified: Secondary | ICD-10-CM | POA: Diagnosis not present

## 2017-10-28 DIAGNOSIS — J189 Pneumonia, unspecified organism: Secondary | ICD-10-CM | POA: Diagnosis not present

## 2017-10-28 LAB — VANCOMYCIN, TROUGH: VANCOMYCIN TR: 21 ug/mL — AB (ref 15–20)

## 2017-10-28 LAB — CULTURE, BLOOD (ROUTINE X 2)
Culture: NO GROWTH
Culture: NO GROWTH

## 2017-10-28 LAB — PROTIME-INR
INR: 3.16
PROTHROMBIN TIME: 32.2 s — AB (ref 11.4–15.2)

## 2017-10-28 LAB — POTASSIUM: POTASSIUM: 3.1 mmol/L — AB (ref 3.5–5.1)

## 2017-10-28 MED ORDER — LIDOCAINE HCL (PF) 1 % IJ SOLN
INTRAMUSCULAR | Status: AC
  Filled 2017-10-28: qty 30

## 2017-10-29 LAB — PROTIME-INR
INR: 1.74
Prothrombin Time: 20.2 seconds — ABNORMAL HIGH (ref 11.4–15.2)

## 2017-10-29 LAB — POTASSIUM: POTASSIUM: 2.9 mmol/L — AB (ref 3.5–5.1)

## 2017-10-30 LAB — PROTIME-INR
INR: 1.5
PROTHROMBIN TIME: 18 s — AB (ref 11.4–15.2)

## 2017-10-30 LAB — POTASSIUM: POTASSIUM: 3.3 mmol/L — AB (ref 3.5–5.1)

## 2017-10-31 LAB — PROTIME-INR
INR: 1.54
PROTHROMBIN TIME: 18.3 s — AB (ref 11.4–15.2)

## 2017-11-01 LAB — BASIC METABOLIC PANEL
ANION GAP: 10 (ref 5–15)
BUN: 13 mg/dL (ref 6–20)
CALCIUM: 8.5 mg/dL — AB (ref 8.9–10.3)
CO2: 28 mmol/L (ref 22–32)
Chloride: 96 mmol/L — ABNORMAL LOW (ref 101–111)
Creatinine, Ser: 1.37 mg/dL — ABNORMAL HIGH (ref 0.61–1.24)
GFR calc Af Amer: >60 mL/min (ref >60)
GFR, EST NON AFRICAN AMERICAN: 53 mL/min — AB (ref >60)
GLUCOSE: 132 mg/dL — AB (ref 65–99)
POTASSIUM: 3.4 mmol/L — AB (ref 3.5–5.1)
Sodium: 134 mmol/L — ABNORMAL LOW (ref 135–145)

## 2017-11-01 LAB — CBC WITH DIFFERENTIAL/PLATELET
BASOS ABS: 0 10*3/uL (ref 0.0–0.1)
Basophils Relative: 0 %
EOS ABS: 0.2 10*3/uL (ref 0.0–0.7)
EOS PCT: 3 %
HCT: 31.5 % — ABNORMAL LOW (ref 39.0–52.0)
HEMOGLOBIN: 9.7 g/dL — AB (ref 13.0–17.0)
LYMPHS ABS: 1 10*3/uL (ref 0.7–4.0)
LYMPHS PCT: 16 %
MCH: 26.9 pg (ref 26.0–34.0)
MCHC: 30.8 g/dL (ref 30.0–36.0)
MCV: 87.3 fL (ref 78.0–100.0)
Monocytes Absolute: 0.3 10*3/uL (ref 0.1–1.0)
Monocytes Relative: 5 %
NEUTROS PCT: 76 %
Neutro Abs: 4.6 10*3/uL (ref 1.7–7.7)
PLATELETS: 378 10*3/uL (ref 150–400)
RBC: 3.61 MIL/uL — AB (ref 4.22–5.81)
RDW: 16.6 % — ABNORMAL HIGH (ref 11.5–15.5)
WBC: 6.1 10*3/uL (ref 4.0–10.5)

## 2017-11-01 LAB — PROTIME-INR
INR: 1.77
Prothrombin Time: 20.5 seconds — ABNORMAL HIGH (ref 11.4–15.2)

## 2017-11-02 LAB — BASIC METABOLIC PANEL
ANION GAP: 9 (ref 5–15)
BUN: 12 mg/dL (ref 6–20)
CHLORIDE: 97 mmol/L — AB (ref 101–111)
CO2: 29 mmol/L (ref 22–32)
Calcium: 8.5 mg/dL — ABNORMAL LOW (ref 8.9–10.3)
Creatinine, Ser: 1.3 mg/dL — ABNORMAL HIGH (ref 0.61–1.24)
GFR calc non Af Amer: 56 mL/min — ABNORMAL LOW (ref >60)
Glucose, Bld: 114 mg/dL — ABNORMAL HIGH (ref 65–99)
POTASSIUM: 3.7 mmol/L (ref 3.5–5.1)
SODIUM: 135 mmol/L (ref 135–145)

## 2017-11-02 LAB — PROTIME-INR
INR: 2.48
Prothrombin Time: 26.6 seconds — ABNORMAL HIGH (ref 11.4–15.2)

## 2017-11-03 LAB — PROTIME-INR
INR: 3.53
PROTHROMBIN TIME: 35.1 s — AB (ref 11.4–15.2)

## 2017-11-04 LAB — PROTIME-INR
INR: 3.86
Prothrombin Time: 37.6 seconds — ABNORMAL HIGH (ref 11.4–15.2)

## 2017-11-05 LAB — CBC
HEMATOCRIT: 30.8 % — AB (ref 39.0–52.0)
Hemoglobin: 9.6 g/dL — ABNORMAL LOW (ref 13.0–17.0)
MCH: 26.6 pg (ref 26.0–34.0)
MCHC: 31.2 g/dL (ref 30.0–36.0)
MCV: 85.3 fL (ref 78.0–100.0)
PLATELETS: 304 10*3/uL (ref 150–400)
RBC: 3.61 MIL/uL — AB (ref 4.22–5.81)
RDW: 16.6 % — AB (ref 11.5–15.5)
WBC: 7.4 10*3/uL (ref 4.0–10.5)

## 2017-11-05 LAB — BLOOD GAS, ARTERIAL
ACID-BASE EXCESS: 4.2 mmol/L — AB (ref 0.0–2.0)
BICARBONATE: 27.6 mmol/L (ref 20.0–28.0)
FIO2: 21
O2 SAT: 93.5 %
PATIENT TEMPERATURE: 98.6
PCO2 ART: 37 mmHg (ref 32.0–48.0)
PO2 ART: 65.1 mmHg — AB (ref 83.0–108.0)
pH, Arterial: 7.485 — ABNORMAL HIGH (ref 7.350–7.450)

## 2017-11-05 LAB — BASIC METABOLIC PANEL
ANION GAP: 8 (ref 5–15)
BUN: 13 mg/dL (ref 6–20)
CALCIUM: 8.4 mg/dL — AB (ref 8.9–10.3)
CO2: 27 mmol/L (ref 22–32)
CREATININE: 1.25 mg/dL — AB (ref 0.61–1.24)
Chloride: 100 mmol/L — ABNORMAL LOW (ref 101–111)
GFR calc Af Amer: >60 mL/min (ref >60)
GFR calc non Af Amer: 59 mL/min — ABNORMAL LOW (ref >60)
GLUCOSE: 151 mg/dL — AB (ref 65–99)
Potassium: 3.2 mmol/L — ABNORMAL LOW (ref 3.5–5.1)
Sodium: 135 mmol/L (ref 135–145)

## 2017-11-05 LAB — PROTIME-INR
INR: 2.97
PROTHROMBIN TIME: 30.7 s — AB (ref 11.4–15.2)

## 2017-11-06 LAB — PROTIME-INR
INR: 2.05
PROTHROMBIN TIME: 23 s — AB (ref 11.4–15.2)

## 2017-11-06 NOTE — Progress Notes (Signed)
  Subjective: Patient breathing comfortably- trach decanulated nsr Not able to walk Tolerating dysphagia diet  Objective: Vital signs in last 24 hours:  afeb  Hemodynamic parameters for last 24 hours:    Intake/Output from previous day: No intake/output data recorded. Intake/Output this shift: No intake/output data recorded.       Exam    General- alert and comfortable   Lungs- clear without rales, wheezes   Cor- regular rate and rhythm, no murmur , gallop   Sternal incision- superficial breakdown of lower incision - pack with saline wet/dry bid.No cellulitis   Abdomen- soft, non-tender. Peg in place   Extremities - warm, non-tender, minimal edema   Neuro- oriented, appropriate, no focal weakness   Lab Results: Recent Labs    11/05/17 1544  WBC 7.4  HGB 9.6*  HCT 30.8*  PLT 304   BMET:  Recent Labs    11/05/17 1544  NA 135  K 3.2*  CL 100*  CO2 27  GLUCOSE 151*  BUN 13  CREATININE 1.25*  CALCIUM 8.4*    PT/INR:  Recent Labs    11/06/17 0601  LABPROT 23.0*  INR 2.05   ABG    Component Value Date/Time   PHART 7.485 (H) 11/05/2017 1545   HCO3 27.6 11/05/2017 1545   TCO2 24 09/20/2017 2220   ACIDBASEDEF 0.0 09/27/2017 0100   O2SAT 93.5 11/05/2017 1545   CBG (last 3)  No results for input(s): GLUCAP in the last 72 hours.  Assessment/Plan: S/P  Ready for tx to SNF- needs more PT Will follow sternal wound in office  LOS: 0 days    Clinton Harrington 11/06/2017

## 2017-11-07 DIAGNOSIS — L89624 Pressure ulcer of left heel, stage 4: Secondary | ICD-10-CM | POA: Diagnosis not present

## 2017-11-07 DIAGNOSIS — L89620 Pressure ulcer of left heel, unstageable: Secondary | ICD-10-CM | POA: Diagnosis not present

## 2017-11-07 DIAGNOSIS — R799 Abnormal finding of blood chemistry, unspecified: Secondary | ICD-10-CM | POA: Diagnosis not present

## 2017-11-07 DIAGNOSIS — Z431 Encounter for attention to gastrostomy: Secondary | ICD-10-CM | POA: Diagnosis not present

## 2017-11-07 DIAGNOSIS — I509 Heart failure, unspecified: Secondary | ICD-10-CM | POA: Diagnosis not present

## 2017-11-07 DIAGNOSIS — R262 Difficulty in walking, not elsewhere classified: Secondary | ICD-10-CM | POA: Diagnosis not present

## 2017-11-07 DIAGNOSIS — R791 Abnormal coagulation profile: Secondary | ICD-10-CM | POA: Diagnosis not present

## 2017-11-07 DIAGNOSIS — M6281 Muscle weakness (generalized): Secondary | ICD-10-CM | POA: Diagnosis not present

## 2017-11-07 DIAGNOSIS — L89610 Pressure ulcer of right heel, unstageable: Secondary | ICD-10-CM | POA: Diagnosis not present

## 2017-11-07 DIAGNOSIS — K219 Gastro-esophageal reflux disease without esophagitis: Secondary | ICD-10-CM | POA: Diagnosis not present

## 2017-11-07 DIAGNOSIS — N139 Obstructive and reflux uropathy, unspecified: Secondary | ICD-10-CM | POA: Diagnosis not present

## 2017-11-07 DIAGNOSIS — M545 Low back pain: Secondary | ICD-10-CM | POA: Diagnosis not present

## 2017-11-07 DIAGNOSIS — R309 Painful micturition, unspecified: Secondary | ICD-10-CM | POA: Diagnosis not present

## 2017-11-07 DIAGNOSIS — R638 Other symptoms and signs concerning food and fluid intake: Secondary | ICD-10-CM | POA: Diagnosis not present

## 2017-11-07 DIAGNOSIS — L97429 Non-pressure chronic ulcer of left heel and midfoot with unspecified severity: Secondary | ICD-10-CM | POA: Diagnosis not present

## 2017-11-07 DIAGNOSIS — R05 Cough: Secondary | ICD-10-CM | POA: Diagnosis not present

## 2017-11-07 DIAGNOSIS — H8149 Vertigo of central origin, unspecified ear: Secondary | ICD-10-CM | POA: Diagnosis not present

## 2017-11-07 DIAGNOSIS — I10 Essential (primary) hypertension: Secondary | ICD-10-CM | POA: Diagnosis not present

## 2017-11-07 DIAGNOSIS — R531 Weakness: Secondary | ICD-10-CM | POA: Diagnosis not present

## 2017-11-07 DIAGNOSIS — E860 Dehydration: Secondary | ICD-10-CM | POA: Diagnosis not present

## 2017-11-07 DIAGNOSIS — R1312 Dysphagia, oropharyngeal phase: Secondary | ICD-10-CM | POA: Diagnosis not present

## 2017-11-07 DIAGNOSIS — L97813 Non-pressure chronic ulcer of other part of right lower leg with necrosis of muscle: Secondary | ICD-10-CM | POA: Diagnosis not present

## 2017-11-07 DIAGNOSIS — J449 Chronic obstructive pulmonary disease, unspecified: Secondary | ICD-10-CM | POA: Diagnosis not present

## 2017-11-07 DIAGNOSIS — I240 Acute coronary thrombosis not resulting in myocardial infarction: Secondary | ICD-10-CM | POA: Diagnosis not present

## 2017-11-07 DIAGNOSIS — I25810 Atherosclerosis of coronary artery bypass graft(s) without angina pectoris: Secondary | ICD-10-CM | POA: Diagnosis not present

## 2017-11-07 DIAGNOSIS — I959 Hypotension, unspecified: Secondary | ICD-10-CM | POA: Diagnosis not present

## 2017-11-07 DIAGNOSIS — S21109A Unspecified open wound of unspecified front wall of thorax without penetration into thoracic cavity, initial encounter: Secondary | ICD-10-CM | POA: Diagnosis not present

## 2017-11-07 DIAGNOSIS — I4891 Unspecified atrial fibrillation: Secondary | ICD-10-CM | POA: Diagnosis not present

## 2017-11-07 DIAGNOSIS — R110 Nausea: Secondary | ICD-10-CM | POA: Diagnosis not present

## 2017-11-07 DIAGNOSIS — R079 Chest pain, unspecified: Secondary | ICD-10-CM | POA: Diagnosis not present

## 2017-11-07 DIAGNOSIS — Z95811 Presence of heart assist device: Secondary | ICD-10-CM | POA: Diagnosis not present

## 2017-11-07 DIAGNOSIS — R0981 Nasal congestion: Secondary | ICD-10-CM | POA: Diagnosis not present

## 2017-11-07 DIAGNOSIS — R1310 Dysphagia, unspecified: Secondary | ICD-10-CM | POA: Diagnosis not present

## 2017-11-07 DIAGNOSIS — L89323 Pressure ulcer of left buttock, stage 3: Secondary | ICD-10-CM | POA: Diagnosis not present

## 2017-11-07 DIAGNOSIS — T83098D Other mechanical complication of other indwelling urethral catheter, subsequent encounter: Secondary | ICD-10-CM | POA: Diagnosis not present

## 2017-11-07 DIAGNOSIS — L89310 Pressure ulcer of right buttock, unstageable: Secondary | ICD-10-CM | POA: Diagnosis not present

## 2017-11-07 DIAGNOSIS — R5381 Other malaise: Secondary | ICD-10-CM | POA: Diagnosis not present

## 2017-11-07 DIAGNOSIS — R42 Dizziness and giddiness: Secondary | ICD-10-CM | POA: Diagnosis not present

## 2017-11-07 DIAGNOSIS — R330 Drug induced retention of urine: Secondary | ICD-10-CM | POA: Diagnosis not present

## 2017-11-07 DIAGNOSIS — N401 Enlarged prostate with lower urinary tract symptoms: Secondary | ICD-10-CM | POA: Diagnosis not present

## 2017-11-07 DIAGNOSIS — L98499 Non-pressure chronic ulcer of skin of other sites with unspecified severity: Secondary | ICD-10-CM | POA: Diagnosis not present

## 2017-11-07 DIAGNOSIS — L97819 Non-pressure chronic ulcer of other part of right lower leg with unspecified severity: Secondary | ICD-10-CM | POA: Diagnosis not present

## 2017-11-07 DIAGNOSIS — R109 Unspecified abdominal pain: Secondary | ICD-10-CM | POA: Diagnosis not present

## 2017-11-07 DIAGNOSIS — I214 Non-ST elevation (NSTEMI) myocardial infarction: Secondary | ICD-10-CM | POA: Diagnosis not present

## 2017-11-07 DIAGNOSIS — E785 Hyperlipidemia, unspecified: Secondary | ICD-10-CM | POA: Diagnosis not present

## 2017-11-07 DIAGNOSIS — N400 Enlarged prostate without lower urinary tract symptoms: Secondary | ICD-10-CM | POA: Diagnosis not present

## 2017-11-07 DIAGNOSIS — L89313 Pressure ulcer of right buttock, stage 3: Secondary | ICD-10-CM | POA: Diagnosis not present

## 2017-11-07 DIAGNOSIS — R41841 Cognitive communication deficit: Secondary | ICD-10-CM | POA: Diagnosis not present

## 2017-11-07 DIAGNOSIS — L89614 Pressure ulcer of right heel, stage 4: Secondary | ICD-10-CM | POA: Diagnosis not present

## 2017-11-07 DIAGNOSIS — I482 Chronic atrial fibrillation: Secondary | ICD-10-CM | POA: Diagnosis not present

## 2017-11-07 DIAGNOSIS — R031 Nonspecific low blood-pressure reading: Secondary | ICD-10-CM | POA: Diagnosis not present

## 2017-11-07 DIAGNOSIS — I110 Hypertensive heart disease with heart failure: Secondary | ICD-10-CM | POA: Diagnosis not present

## 2017-11-07 DIAGNOSIS — R339 Retention of urine, unspecified: Secondary | ICD-10-CM | POA: Diagnosis not present

## 2017-11-07 DIAGNOSIS — E039 Hypothyroidism, unspecified: Secondary | ICD-10-CM | POA: Diagnosis not present

## 2017-11-07 DIAGNOSIS — Z951 Presence of aortocoronary bypass graft: Secondary | ICD-10-CM | POA: Diagnosis not present

## 2017-11-07 LAB — PROTIME-INR
INR: 1.48
PROTHROMBIN TIME: 17.8 s — AB (ref 11.4–15.2)

## 2017-11-10 ENCOUNTER — Ambulatory Visit: Payer: Self-pay

## 2017-11-10 DIAGNOSIS — M6281 Muscle weakness (generalized): Secondary | ICD-10-CM | POA: Diagnosis not present

## 2017-11-10 DIAGNOSIS — J449 Chronic obstructive pulmonary disease, unspecified: Secondary | ICD-10-CM | POA: Diagnosis not present

## 2017-11-10 DIAGNOSIS — I482 Chronic atrial fibrillation: Secondary | ICD-10-CM | POA: Diagnosis not present

## 2017-11-10 DIAGNOSIS — Z951 Presence of aortocoronary bypass graft: Secondary | ICD-10-CM | POA: Diagnosis not present

## 2017-11-11 DIAGNOSIS — I509 Heart failure, unspecified: Secondary | ICD-10-CM | POA: Diagnosis not present

## 2017-11-11 DIAGNOSIS — I25810 Atherosclerosis of coronary artery bypass graft(s) without angina pectoris: Secondary | ICD-10-CM | POA: Diagnosis not present

## 2017-11-11 DIAGNOSIS — I482 Chronic atrial fibrillation: Secondary | ICD-10-CM | POA: Diagnosis not present

## 2017-11-11 DIAGNOSIS — I10 Essential (primary) hypertension: Secondary | ICD-10-CM | POA: Diagnosis not present

## 2017-11-13 DIAGNOSIS — L98499 Non-pressure chronic ulcer of skin of other sites with unspecified severity: Secondary | ICD-10-CM | POA: Diagnosis not present

## 2017-11-13 DIAGNOSIS — L89323 Pressure ulcer of left buttock, stage 3: Secondary | ICD-10-CM | POA: Diagnosis not present

## 2017-11-13 DIAGNOSIS — L97813 Non-pressure chronic ulcer of other part of right lower leg with necrosis of muscle: Secondary | ICD-10-CM | POA: Diagnosis not present

## 2017-11-15 DIAGNOSIS — R42 Dizziness and giddiness: Secondary | ICD-10-CM | POA: Diagnosis not present

## 2017-11-15 DIAGNOSIS — R031 Nonspecific low blood-pressure reading: Secondary | ICD-10-CM | POA: Diagnosis not present

## 2017-11-15 DIAGNOSIS — M6281 Muscle weakness (generalized): Secondary | ICD-10-CM | POA: Diagnosis not present

## 2017-11-15 DIAGNOSIS — R799 Abnormal finding of blood chemistry, unspecified: Secondary | ICD-10-CM | POA: Diagnosis not present

## 2017-11-20 DIAGNOSIS — R1312 Dysphagia, oropharyngeal phase: Secondary | ICD-10-CM | POA: Diagnosis not present

## 2017-11-20 DIAGNOSIS — M6281 Muscle weakness (generalized): Secondary | ICD-10-CM | POA: Diagnosis not present

## 2017-11-20 DIAGNOSIS — R5381 Other malaise: Secondary | ICD-10-CM | POA: Diagnosis not present

## 2017-11-20 DIAGNOSIS — L97813 Non-pressure chronic ulcer of other part of right lower leg with necrosis of muscle: Secondary | ICD-10-CM | POA: Diagnosis not present

## 2017-11-20 DIAGNOSIS — R110 Nausea: Secondary | ICD-10-CM | POA: Diagnosis not present

## 2017-11-20 DIAGNOSIS — L98499 Non-pressure chronic ulcer of skin of other sites with unspecified severity: Secondary | ICD-10-CM | POA: Diagnosis not present

## 2017-11-20 DIAGNOSIS — L89323 Pressure ulcer of left buttock, stage 3: Secondary | ICD-10-CM | POA: Diagnosis not present

## 2017-11-26 ENCOUNTER — Encounter: Payer: Self-pay | Admitting: Cardiothoracic Surgery

## 2017-11-27 ENCOUNTER — Other Ambulatory Visit: Payer: Self-pay | Admitting: *Deleted

## 2017-11-27 DIAGNOSIS — L89620 Pressure ulcer of left heel, unstageable: Secondary | ICD-10-CM | POA: Diagnosis not present

## 2017-11-27 DIAGNOSIS — L97819 Non-pressure chronic ulcer of other part of right lower leg with unspecified severity: Secondary | ICD-10-CM | POA: Diagnosis not present

## 2017-11-27 DIAGNOSIS — L89323 Pressure ulcer of left buttock, stage 3: Secondary | ICD-10-CM | POA: Diagnosis not present

## 2017-11-27 NOTE — Patient Outreach (Signed)
[facility]) Care Management  11/27/2017  RANBIR CHEW 1951/11/05 [id number]  Met with Marita [place], SW at facility.  She reports patient continues to get wound care but is not doing well with therapy. She states he is having a hard time and just not improving, she is thinking patient may need palliative care consult. She plans to discuss at care team meeting.   RNCM verified that palliative care can follow patient while at facility.  Plan to update [facility] care team. Royetta Crochet. Laymond Purser, RN, BSN, [place] [phone]) Business Cell  [phone]) Toll Free Office

## 2017-12-03 DIAGNOSIS — I482 Chronic atrial fibrillation: Secondary | ICD-10-CM | POA: Diagnosis not present

## 2017-12-03 DIAGNOSIS — R791 Abnormal coagulation profile: Secondary | ICD-10-CM | POA: Diagnosis not present

## 2017-12-03 DIAGNOSIS — M6281 Muscle weakness (generalized): Secondary | ICD-10-CM | POA: Diagnosis not present

## 2017-12-04 DIAGNOSIS — L97819 Non-pressure chronic ulcer of other part of right lower leg with unspecified severity: Secondary | ICD-10-CM | POA: Diagnosis not present

## 2017-12-04 DIAGNOSIS — L89323 Pressure ulcer of left buttock, stage 3: Secondary | ICD-10-CM | POA: Diagnosis not present

## 2017-12-05 DIAGNOSIS — I959 Hypotension, unspecified: Secondary | ICD-10-CM | POA: Diagnosis not present

## 2017-12-05 DIAGNOSIS — E860 Dehydration: Secondary | ICD-10-CM | POA: Diagnosis not present

## 2017-12-05 DIAGNOSIS — H8149 Vertigo of central origin, unspecified ear: Secondary | ICD-10-CM | POA: Diagnosis not present

## 2017-12-10 DIAGNOSIS — R531 Weakness: Secondary | ICD-10-CM | POA: Diagnosis not present

## 2017-12-10 DIAGNOSIS — M6281 Muscle weakness (generalized): Secondary | ICD-10-CM | POA: Diagnosis not present

## 2017-12-10 DIAGNOSIS — M545 Low back pain: Secondary | ICD-10-CM | POA: Diagnosis not present

## 2017-12-10 DIAGNOSIS — R262 Difficulty in walking, not elsewhere classified: Secondary | ICD-10-CM | POA: Diagnosis not present

## 2017-12-10 DIAGNOSIS — R5381 Other malaise: Secondary | ICD-10-CM | POA: Diagnosis not present

## 2017-12-11 DIAGNOSIS — L89323 Pressure ulcer of left buttock, stage 3: Secondary | ICD-10-CM | POA: Diagnosis not present

## 2017-12-11 DIAGNOSIS — L98499 Non-pressure chronic ulcer of skin of other sites with unspecified severity: Secondary | ICD-10-CM | POA: Diagnosis not present

## 2017-12-12 DIAGNOSIS — J449 Chronic obstructive pulmonary disease, unspecified: Secondary | ICD-10-CM | POA: Diagnosis not present

## 2017-12-12 DIAGNOSIS — M6281 Muscle weakness (generalized): Secondary | ICD-10-CM | POA: Diagnosis not present

## 2017-12-12 DIAGNOSIS — I482 Chronic atrial fibrillation: Secondary | ICD-10-CM | POA: Diagnosis not present

## 2017-12-12 DIAGNOSIS — R1312 Dysphagia, oropharyngeal phase: Secondary | ICD-10-CM | POA: Diagnosis not present

## 2017-12-16 DIAGNOSIS — R531 Weakness: Secondary | ICD-10-CM | POA: Diagnosis not present

## 2017-12-17 ENCOUNTER — Other Ambulatory Visit: Payer: Self-pay | Admitting: *Deleted

## 2017-12-17 NOTE — Patient Outreach (Signed)
[facility] Care Management  12/17/2017  Clinton Harrington 01/01/1951 [id number]   Per Wilkie AyeKristy, SW at facility.  Patient would like to go home eventually but he is still a long way from being able to go home if ever independently due to level of care needs.  Patient is being followed by palliative care. Patient has applied for Medicaid.   Plan to update [facility] care team [facility] Center[facility] E. Albertha GheeNiemczura, RN, BSN, CCM  Post Acute Chartered loss adjusterCare Coordinator [facility] [phone]) Business Cell  [phone]) Toll Free Office

## 2017-12-18 DIAGNOSIS — L98499 Non-pressure chronic ulcer of skin of other sites with unspecified severity: Secondary | ICD-10-CM | POA: Diagnosis not present

## 2017-12-24 ENCOUNTER — Ambulatory Visit: Payer: PPO | Admitting: Cardiothoracic Surgery

## 2017-12-25 ENCOUNTER — Other Ambulatory Visit: Payer: Self-pay | Admitting: Cardiothoracic Surgery

## 2017-12-25 DIAGNOSIS — L89620 Pressure ulcer of left heel, unstageable: Secondary | ICD-10-CM | POA: Diagnosis not present

## 2017-12-25 DIAGNOSIS — I25119 Atherosclerotic heart disease of native coronary artery with unspecified angina pectoris: Secondary | ICD-10-CM

## 2017-12-25 DIAGNOSIS — L89323 Pressure ulcer of left buttock, stage 3: Secondary | ICD-10-CM | POA: Diagnosis not present

## 2017-12-26 ENCOUNTER — Encounter: Payer: Self-pay | Admitting: Cardiothoracic Surgery

## 2017-12-26 ENCOUNTER — Ambulatory Visit
Admission: RE | Admit: 2017-12-26 | Discharge: 2017-12-26 | Disposition: A | Discharge status: Home / Self Care | Payer: PPO | Source: Ambulatory Visit | Attending: Cardiothoracic Surgery | Admitting: Cardiothoracic Surgery

## 2017-12-26 ENCOUNTER — Other Ambulatory Visit: Payer: Self-pay | Admitting: Cardiothoracic Surgery

## 2017-12-26 ENCOUNTER — Ambulatory Visit (INDEPENDENT_AMBULATORY_CARE_PROVIDER_SITE_OTHER): Payer: PPO | Admitting: Cardiothoracic Surgery

## 2017-12-26 VITALS — BP 99/73 | HR 98 | Resp 18 | Ht 67.0 in

## 2017-12-26 DIAGNOSIS — I25810 Atherosclerosis of coronary artery bypass graft(s) without angina pectoris: Secondary | ICD-10-CM | POA: Diagnosis not present

## 2017-12-26 DIAGNOSIS — Z95811 Presence of heart assist device: Secondary | ICD-10-CM

## 2017-12-26 DIAGNOSIS — I25119 Atherosclerotic heart disease of native coronary artery with unspecified angina pectoris: Secondary | ICD-10-CM

## 2017-12-26 DIAGNOSIS — Z951 Presence of aortocoronary bypass graft: Secondary | ICD-10-CM | POA: Diagnosis not present

## 2017-12-26 NOTE — Progress Notes (Signed)
PCP is Johny Blamer, MD Referring Provider is Runell Gess, MD  Chief Complaint  Patient presents with  . Routine Post Op    f/u from surgery with CXR s/p Coronary artery bypass grafting x4on 08/28/17,  Decannulation and removal of right ventricular assist device 09/08/17    HPI: Patient presents from a skilled nursing facility Patient had urgent CABG 4 with September 2018 for acute MI, RV infarct, and cardiogenic shock. A percutaneousRVAD  was needed for 1 week postop to support right ventricular function. He subsequently was decannulated and progressed. He was transferred to the [facility] facility and subsequently to a skilled nursing facility.  The patient has stable cardiac status. He is in sinus rhythm without evidence of heart failure The lower sternal incision has a superficial breakdown-ulceration and is being treated with twice a day wet-to-dry dressing changes. It is clean. An exposed wire is removed in the office today.    Past Medical History:  Diagnosis Date  . Arthritis   . Diabetes (HCC)   . Diabetes mellitus Jan. 1999  . High cholesterol   . Hypertension   . Myocardial infarct (HCC)   . Peripheral vascular disease (HCC)   . Seasonal allergies   . Smoker   . Urine incontinence     Past Surgical History:  Procedure Laterality Date  . CORONARY ARTERY BYPASS GRAFT N/A 08/28/2017   Procedure: CORONARY ARTERY BYPASS GRAFTING (CABG) times four using left internal mammary artery and right greater saphenous leg vein using endoscope.;  Surgeon: Kerin Perna, MD;  Location[facility] OR;  Service: Open Heart Surgery;  Laterality: N/A;  . CORONARY STENT PLACEMENT  Jan.  1999  . FEMORAL-POPLITEAL BYPASS GRAFT Left 01/03/2016   Procedure: LEFT COMMON FEMORAL-BELOW KNEE POPLITEAL ARTERY BYPASS GRAFT USING NON REVERSED TRANSLOCATED SAPHENOUS VEIN GRAFT;  Surgeon: Pryor Ochoa, MD;  Location: [facility] OR;  Service: Vascular;  Laterality: Left;  . IABP INSERTION N/A 08/23/2017   Procedure: IABP Insertion;  Surgeon: Runell Gess, MD;  Location: [facility] INVASIVE CV LAB;  Service: Cardiovascular;  Laterality: N/A;  . INTRAOPERATIVE ARTERIOGRAM Left 01/03/2016   Procedure: INTRA OPERATIVE ARTERIOGRAM LEFT LEG;  Surgeon: Pryor Ochoa, MD;  Location: [facility] OR;  Service: Vascular;  Laterality: Left;  . IR GASTROSTOMY TUBE MOD SED  10/01/2017  . LAPAROTOMY N/A 10/07/2017   Procedure: EXPLORATORY LAPAROTOMY;  Surgeon: Abigail Miyamoto, MD;  Location: [facility] OR;  Service: General;  Laterality: N/A;  . LEFT HEART CATH AND CORS/GRAFTS ANGIOGRAPHY N/A 08/23/2017   Procedure: LEFT HEART CATH AND CORS/GRAFTS ANGIOGRAPHY;  Surgeon: Runell Gess, MD;  Location: [facility] INVASIVE CV LAB;  Service: Cardiovascular;  Laterality: N/A;  . PERIPHERAL VASCULAR CATHETERIZATION N/A 01/01/2016   Procedure: Abdominal Aortogram w/Lower Extremity;  Surgeon: Fransisco Hertz, MD;  Location: [facility] INVASIVE CV LAB;  Service: Cardiovascular;  Laterality: N/A;  . PERIPHERAL VASCULAR CATHETERIZATION Left 01/01/2016   Procedure: Peripheral Vascular Balloon Angioplasty;  Surgeon: Fransisco Hertz, MD;  Location: [facility] INVASIVE CV LAB;  Service: Cardiovascular;  Laterality: Left;  Popliteal artery  . PERIPHERAL VASCULAR CATHETERIZATION N/A 12/04/2016   Procedure: Abdominal Aortogram w/Lower Extremity;  Surgeon: Maeola Harman, MD;  Location: [facility] INVASIVE CV LAB;  Service: Cardiovascular;  Laterality: N/A;  . PERIPHERAL VASCULAR CATHETERIZATION Left 12/04/2016   Procedure: Peripheral Vascular Balloon Angioplasty;  Surgeon: Maeola Harman, MD;  Location: [facility] INVASIVE CV LAB;  Service: Cardiovascular;  Laterality: Left;  Fem_Pop bypass  . PLACEMENT OF CENTRIMAG VENTRICULAR ASSIST DEVICE Right  08/28/2017   Procedure: PLACEMENT OF CENTRIMAG VENTRICULAR ASSIST DEVICE;  Surgeon: Kerin PernaVan Trigt, Emory Gallentine, MD;  [facility];  Service: Open Heart Surgery;  Laterality: Right;  . REMOVAL OF CENTRIMAG VENTRICULAR ASSIST DEVICE N/A  09/08/2017   Procedure: REMOVAL OF R-VENTRICULAR ASSIST DEVICE WITH CARDIOPULMONARY BYPASS;  Surgeon: Kerin PernaVan Trigt, Zahira Brummond, MD;  [facility];  Service: Open Heart Surgery;  Laterality: N/A;  . REMOVAL OF GASTROSTOMY TUBE N/A 10/07/2017   Procedure: REMOVAL AND REPLACEMENT OF GASTROSTOMY TUBE;  Surgeon: Abigail MiyamotoBlackman, Douglas, MD;  Location: [facility] OR;  Service: General;  Laterality: N/A;  . TEE WITHOUT CARDIOVERSION N/A 08/28/2017   Procedure: TRANSESOPHAGEAL ECHOCARDIOGRAM (TEE);  Surgeon: Donata ClayVan Trigt, Theron AristaPeter, MD;  [facility];  Service: Open Heart Surgery;  Laterality: N/A;  . TEE WITHOUT CARDIOVERSION N/A 09/08/2017   Procedure: TRANSESOPHAGEAL ECHOCARDIOGRAM (TEE);  Surgeon: Donata ClayVan Trigt, Theron AristaPeter, MD;  [facility];  Service: Open Heart Surgery;  Laterality: N/A;    Family History  Problem Relation Age of Onset  . Arthritis Mother   . Hypertension Mother   . Heart disease Maternal Grandmother   . Mental illness Maternal Grandmother     Social History Social History   Tobacco Use  . Smoking status: Current Every Day Smoker    Packs/day: 0.50    Years: 45.00    Pack years: 22.50    Types: Cigarettes  . Smokeless tobacco: Never Used  . Tobacco comment: 1/2 pk per day  Substance Use Topics  . Alcohol use: No    Alcohol/week: 0.0 oz  . Drug use: No    Current Outpatient Medications  Medication Sig Dispense Refill  . atorvastatin (LIPITOR) 80 MG tablet Place 1 tablet (80 mg total) into feeding tube daily at 6 PM. 30 tablet 1  . budesonide (PULMICORT) 0.25 MG/2ML nebulizer solution Take 2 mLs (0.25 mg total) by nebulization 2 (two) times daily. 60 mL 12  . collagenase (SANTYL) ointment Apply topically daily. 15 g 0  . levothyroxine (SYNTHROID, LEVOTHROID) 25 MCG tablet Take 25 mcg by mouth daily before breakfast.    . Lido-Capsaicin-Men-Methyl Sal (MEDI-PATCH-LIDOCAINE EX) Apply 5 % topically daily.    . metoCLOPramide (REGLAN) 5 MG tablet Take 5 mg by mouth 4 (four) times daily.    .  mirtazapine (REMERON) 15 MG tablet Take 15 mg by mouth at bedtime.    . ondansetron (ZOFRAN) 4 MG/2ML SOLN injection Inject 2 mLs (4 mg total) into the vein every 6 (six) hours as needed for nausea or vomiting. 2 mL 0  . pantoprazole sodium (PROTONIX) 40 mg/20 mL PACK Place 20 mLs (40 mg total) into feeding tube daily. 30 each 4  . traMADol (ULTRAM) 50 MG tablet Take by mouth every 6 (six) hours as needed.    . warfarin (COUMADIN) 3 MG tablet Take 1 tablet (3 mg total) by mouth one time only at 6 PM. (Patient taking differently: Take 1.5 mg by mouth one time only at 6 PM. ) 30 tablet 1   No current facility-administered medications for this visit.     Allergies  Allergen Reactions  . Aspirin Other (See Comments)    Stomach upset  . Latex Rash  . Oxycodone-Acetaminophen Itching    Review of Systems  Patient unable to walk because diabetic neuropathy Feeding tube still in place and apparently receiving nighttime tube feeds  BP 99/73   Pulse 98   Resp 18   Ht 5\' 7"  (1.702 m)   SpO2 97% Comment: RA  BMI 27.28 kg/m  Physical Exam Chronically ill-appearing gentleman in no acute distress responsive and appropriate Lungs clear bilaterally Heart rate regular without gallop or murmur Sternum stable but with lower superficial soft tissue defect with clean granulation tissue. An exposed sternal wire was removed in the office. The wound is repacked personally with saline wet-to-dry. Lower extremities have bilateral heel contact ulceration with some malodorous drainage. These were clean, Silvadene dressings were applied and the feet were wrapped in Kling gauze Generally weak but no focal motor deficit  Diagnostic Tests: None  Impression: Patient remains debilitated with a slow to heal lower sternal incision but this is clean with granulation tissue. Recommend a week of oral Keflex and continue twice a day dressing changes with Dakin's solution The patient remains in sinus rhythm now 3  months after surgery and does not need Coumadin. Plan: Return for wound check in 2 weeks.  [name], MD [facility] [phone]

## 2017-12-29 DIAGNOSIS — R0981 Nasal congestion: Secondary | ICD-10-CM | POA: Diagnosis not present

## 2017-12-29 DIAGNOSIS — R05 Cough: Secondary | ICD-10-CM | POA: Diagnosis not present

## 2017-12-29 DIAGNOSIS — M6281 Muscle weakness (generalized): Secondary | ICD-10-CM | POA: Diagnosis not present

## 2017-12-31 ENCOUNTER — Other Ambulatory Visit: Payer: Self-pay | Admitting: *Deleted

## 2017-12-31 NOTE — Patient Outreach (Signed)
[facility]) Care Management  12/31/2017  Clinton Harrington 1951-12-09 [id number]   Met with patient at facility.  Patient being moved to LTC room during visit, even though he is still there under skilled at this time.  He has decided to remain at facility for LTC. He still would like to be able to get home someday.  He knows he has a long way to go before that can happen.  He does state that he is getting better.  He voices that he does not have much support, he states his mom passed away on his birthday, he is still grieving this as she was his only family his brother passed away a few years ago. He does state he has an adopted daughter, she has not visited him since he has been at facility but does know he is at the facility.   This RNCM reviewed skilled days versus LTC.  This RNCM reviewed needing a 60 day stint of wellness to get 100 skilled days again.  Patient confirms he does understand this.  He states he has discussed his situation with his friend and has decided to use his 100 days.  SW confirms patient is still getting palliative care and they are planning to keep patient at facility LTC instead of transferring him out to another facility as they previously planned.   Plan to update [facility] UM care team.  Will sign off. Clinton Harrington. Clinton Purser, RN, BSN, [place] [phone]) Business Cell  [phone]) Toll Free Office

## 2018-01-01 DIAGNOSIS — L89323 Pressure ulcer of left buttock, stage 3: Secondary | ICD-10-CM | POA: Diagnosis not present

## 2018-01-01 DIAGNOSIS — L89620 Pressure ulcer of left heel, unstageable: Secondary | ICD-10-CM | POA: Diagnosis not present

## 2018-01-07 ENCOUNTER — Ambulatory Visit: Payer: PPO | Admitting: Cardiothoracic Surgery

## 2018-01-08 DIAGNOSIS — L89323 Pressure ulcer of left buttock, stage 3: Secondary | ICD-10-CM | POA: Diagnosis not present

## 2018-01-08 DIAGNOSIS — L89620 Pressure ulcer of left heel, unstageable: Secondary | ICD-10-CM | POA: Diagnosis not present

## 2018-01-08 DIAGNOSIS — L89610 Pressure ulcer of right heel, unstageable: Secondary | ICD-10-CM | POA: Diagnosis not present

## 2018-01-09 ENCOUNTER — Other Ambulatory Visit: Payer: Self-pay | Admitting: Vascular Surgery

## 2018-01-15 DIAGNOSIS — L89614 Pressure ulcer of right heel, stage 4: Secondary | ICD-10-CM | POA: Diagnosis not present

## 2018-01-15 DIAGNOSIS — L89313 Pressure ulcer of right buttock, stage 3: Secondary | ICD-10-CM | POA: Diagnosis not present

## 2018-01-15 DIAGNOSIS — L98499 Non-pressure chronic ulcer of skin of other sites with unspecified severity: Secondary | ICD-10-CM | POA: Diagnosis not present

## 2018-01-15 DIAGNOSIS — L89620 Pressure ulcer of left heel, unstageable: Secondary | ICD-10-CM | POA: Diagnosis not present

## 2018-01-19 DIAGNOSIS — R309 Painful micturition, unspecified: Secondary | ICD-10-CM | POA: Diagnosis not present

## 2018-01-19 DIAGNOSIS — T83098D Other mechanical complication of other indwelling urethral catheter, subsequent encounter: Secondary | ICD-10-CM | POA: Diagnosis not present

## 2018-01-19 DIAGNOSIS — R330 Drug induced retention of urine: Secondary | ICD-10-CM | POA: Diagnosis not present

## 2018-01-19 DIAGNOSIS — N401 Enlarged prostate with lower urinary tract symptoms: Secondary | ICD-10-CM | POA: Diagnosis not present

## 2018-01-20 ENCOUNTER — Encounter: Payer: Self-pay | Admitting: Cardiology

## 2018-01-30 DIAGNOSIS — L98499 Non-pressure chronic ulcer of skin of other sites with unspecified severity: Secondary | ICD-10-CM | POA: Diagnosis not present

## 2018-02-05 DIAGNOSIS — L89313 Pressure ulcer of right buttock, stage 3: Secondary | ICD-10-CM | POA: Diagnosis not present

## 2018-02-05 DIAGNOSIS — L98499 Non-pressure chronic ulcer of skin of other sites with unspecified severity: Secondary | ICD-10-CM | POA: Diagnosis not present

## 2018-02-05 DIAGNOSIS — L89624 Pressure ulcer of left heel, stage 4: Secondary | ICD-10-CM | POA: Diagnosis not present

## 2018-02-12 DIAGNOSIS — L89614 Pressure ulcer of right heel, stage 4: Secondary | ICD-10-CM | POA: Diagnosis not present

## 2018-02-12 DIAGNOSIS — L89624 Pressure ulcer of left heel, stage 4: Secondary | ICD-10-CM | POA: Diagnosis not present

## 2018-02-12 DIAGNOSIS — L89310 Pressure ulcer of right buttock, unstageable: Secondary | ICD-10-CM | POA: Diagnosis not present

## 2018-02-13 DIAGNOSIS — L89614 Pressure ulcer of right heel, stage 4: Secondary | ICD-10-CM | POA: Diagnosis not present

## 2018-02-13 DIAGNOSIS — R1310 Dysphagia, unspecified: Secondary | ICD-10-CM | POA: Diagnosis not present

## 2018-02-13 DIAGNOSIS — L89624 Pressure ulcer of left heel, stage 4: Secondary | ICD-10-CM | POA: Diagnosis not present

## 2018-02-13 DIAGNOSIS — R638 Other symptoms and signs concerning food and fluid intake: Secondary | ICD-10-CM | POA: Diagnosis not present

## 2018-02-19 DIAGNOSIS — R531 Weakness: Secondary | ICD-10-CM | POA: Diagnosis not present

## 2018-02-19 DIAGNOSIS — L89310 Pressure ulcer of right buttock, unstageable: Secondary | ICD-10-CM | POA: Diagnosis not present

## 2018-02-19 DIAGNOSIS — L98499 Non-pressure chronic ulcer of skin of other sites with unspecified severity: Secondary | ICD-10-CM | POA: Diagnosis not present

## 2018-02-19 DIAGNOSIS — L89614 Pressure ulcer of right heel, stage 4: Secondary | ICD-10-CM | POA: Diagnosis not present

## 2018-02-21 ENCOUNTER — Encounter (HOSPITAL_COMMUNITY): Payer: Self-pay

## 2018-02-21 ENCOUNTER — Inpatient Hospital Stay (HOSPITAL_COMMUNITY)
Admission: EM | Admit: 2018-02-21 | Discharge: 2018-02-28 | DRG: 872 | Disposition: A | Discharge status: Hospice | Payer: PPO | Attending: Internal Medicine | Admitting: Internal Medicine

## 2018-02-21 ENCOUNTER — Inpatient Hospital Stay (HOSPITAL_COMMUNITY): Payer: PPO

## 2018-02-21 ENCOUNTER — Other Ambulatory Visit: Payer: Self-pay

## 2018-02-21 ENCOUNTER — Emergency Department (HOSPITAL_COMMUNITY): Payer: PPO

## 2018-02-21 DIAGNOSIS — Z515 Encounter for palliative care: Secondary | ICD-10-CM | POA: Diagnosis not present

## 2018-02-21 DIAGNOSIS — I252 Old myocardial infarction: Secondary | ICD-10-CM

## 2018-02-21 DIAGNOSIS — L89150 Pressure ulcer of sacral region, unstageable: Secondary | ICD-10-CM | POA: Diagnosis not present

## 2018-02-21 DIAGNOSIS — E872 Acidosis: Secondary | ICD-10-CM | POA: Diagnosis present

## 2018-02-21 DIAGNOSIS — E1169 Type 2 diabetes mellitus with other specified complication: Secondary | ICD-10-CM | POA: Diagnosis not present

## 2018-02-21 DIAGNOSIS — R932 Abnormal findings on diagnostic imaging of liver and biliary tract: Secondary | ICD-10-CM | POA: Diagnosis not present

## 2018-02-21 DIAGNOSIS — K828 Other specified diseases of gallbladder: Secondary | ICD-10-CM | POA: Diagnosis not present

## 2018-02-21 DIAGNOSIS — I509 Heart failure, unspecified: Secondary | ICD-10-CM | POA: Diagnosis present

## 2018-02-21 DIAGNOSIS — L89159 Pressure ulcer of sacral region, unspecified stage: Secondary | ICD-10-CM | POA: Diagnosis not present

## 2018-02-21 DIAGNOSIS — Z8249 Family history of ischemic heart disease and other diseases of the circulatory system: Secondary | ICD-10-CM | POA: Diagnosis not present

## 2018-02-21 DIAGNOSIS — K9423 Gastrostomy malfunction: Secondary | ICD-10-CM | POA: Diagnosis not present

## 2018-02-21 DIAGNOSIS — M4628 Osteomyelitis of vertebra, sacral and sacrococcygeal region: Secondary | ICD-10-CM | POA: Diagnosis not present

## 2018-02-21 DIAGNOSIS — R64 Cachexia: Secondary | ICD-10-CM | POA: Diagnosis not present

## 2018-02-21 DIAGNOSIS — L89899 Pressure ulcer of other site, unspecified stage: Secondary | ICD-10-CM | POA: Diagnosis present

## 2018-02-21 DIAGNOSIS — I96 Gangrene, not elsewhere classified: Secondary | ICD-10-CM | POA: Diagnosis present

## 2018-02-21 DIAGNOSIS — Z951 Presence of aortocoronary bypass graft: Secondary | ICD-10-CM

## 2018-02-21 DIAGNOSIS — D62 Acute posthemorrhagic anemia: Secondary | ICD-10-CM | POA: Diagnosis not present

## 2018-02-21 DIAGNOSIS — R627 Adult failure to thrive: Secondary | ICD-10-CM | POA: Diagnosis not present

## 2018-02-21 DIAGNOSIS — I2510 Atherosclerotic heart disease of native coronary artery without angina pectoris: Secondary | ICD-10-CM | POA: Diagnosis not present

## 2018-02-21 DIAGNOSIS — J449 Chronic obstructive pulmonary disease, unspecified: Secondary | ICD-10-CM | POA: Diagnosis present

## 2018-02-21 DIAGNOSIS — S31000A Unspecified open wound of lower back and pelvis without penetration into retroperitoneum, initial encounter: Secondary | ICD-10-CM

## 2018-02-21 DIAGNOSIS — R402441 Other coma, without documented Glasgow coma scale score, or with partial score reported, in the field [EMT or ambulance]: Secondary | ICD-10-CM | POA: Diagnosis not present

## 2018-02-21 DIAGNOSIS — I110 Hypertensive heart disease with heart failure: Secondary | ICD-10-CM | POA: Diagnosis present

## 2018-02-21 DIAGNOSIS — R109 Unspecified abdominal pain: Secondary | ICD-10-CM | POA: Diagnosis not present

## 2018-02-21 DIAGNOSIS — Z7189 Other specified counseling: Secondary | ICD-10-CM

## 2018-02-21 DIAGNOSIS — Z8261 Family history of arthritis: Secondary | ICD-10-CM | POA: Diagnosis not present

## 2018-02-21 DIAGNOSIS — M868X7 Other osteomyelitis, ankle and foot: Secondary | ICD-10-CM | POA: Diagnosis present

## 2018-02-21 DIAGNOSIS — I4510 Unspecified right bundle-branch block: Secondary | ICD-10-CM | POA: Diagnosis present

## 2018-02-21 DIAGNOSIS — Z955 Presence of coronary angioplasty implant and graft: Secondary | ICD-10-CM | POA: Diagnosis not present

## 2018-02-21 DIAGNOSIS — Z885 Allergy status to narcotic agent status: Secondary | ICD-10-CM

## 2018-02-21 DIAGNOSIS — I10 Essential (primary) hypertension: Secondary | ICD-10-CM | POA: Diagnosis not present

## 2018-02-21 DIAGNOSIS — E1152 Type 2 diabetes mellitus with diabetic peripheral angiopathy with gangrene: Secondary | ICD-10-CM | POA: Diagnosis present

## 2018-02-21 DIAGNOSIS — M869 Osteomyelitis, unspecified: Secondary | ICD-10-CM | POA: Diagnosis not present

## 2018-02-21 DIAGNOSIS — L89629 Pressure ulcer of left heel, unspecified stage: Secondary | ICD-10-CM | POA: Diagnosis not present

## 2018-02-21 DIAGNOSIS — D649 Anemia, unspecified: Secondary | ICD-10-CM | POA: Diagnosis present

## 2018-02-21 DIAGNOSIS — K920 Hematemesis: Secondary | ICD-10-CM | POA: Diagnosis present

## 2018-02-21 DIAGNOSIS — Z886 Allergy status to analgesic agent status: Secondary | ICD-10-CM

## 2018-02-21 DIAGNOSIS — E861 Hypovolemia: Secondary | ICD-10-CM | POA: Diagnosis not present

## 2018-02-21 DIAGNOSIS — E7800 Pure hypercholesterolemia, unspecified: Secondary | ICD-10-CM | POA: Diagnosis present

## 2018-02-21 DIAGNOSIS — K219 Gastro-esophageal reflux disease without esophagitis: Secondary | ICD-10-CM | POA: Diagnosis not present

## 2018-02-21 DIAGNOSIS — Z6822 Body mass index (BMI) 22.0-22.9, adult: Secondary | ICD-10-CM

## 2018-02-21 DIAGNOSIS — Z91040 Latex allergy status: Secondary | ICD-10-CM

## 2018-02-21 DIAGNOSIS — L97529 Non-pressure chronic ulcer of other part of left foot with unspecified severity: Secondary | ICD-10-CM | POA: Diagnosis not present

## 2018-02-21 DIAGNOSIS — L89619 Pressure ulcer of right heel, unspecified stage: Secondary | ICD-10-CM | POA: Diagnosis not present

## 2018-02-21 DIAGNOSIS — E11622 Type 2 diabetes mellitus with other skin ulcer: Secondary | ICD-10-CM | POA: Diagnosis not present

## 2018-02-21 DIAGNOSIS — I70302 Unspecified atherosclerosis of unspecified type of bypass graft(s) of the extremities, left leg: Secondary | ICD-10-CM | POA: Diagnosis not present

## 2018-02-21 DIAGNOSIS — L97519 Non-pressure chronic ulcer of other part of right foot with unspecified severity: Secondary | ICD-10-CM | POA: Diagnosis not present

## 2018-02-21 DIAGNOSIS — A419 Sepsis, unspecified organism: Principal | ICD-10-CM

## 2018-02-21 DIAGNOSIS — F17210 Nicotine dependence, cigarettes, uncomplicated: Secondary | ICD-10-CM | POA: Diagnosis present

## 2018-02-21 DIAGNOSIS — L89620 Pressure ulcer of left heel, unstageable: Secondary | ICD-10-CM | POA: Diagnosis not present

## 2018-02-21 DIAGNOSIS — E860 Dehydration: Secondary | ICD-10-CM | POA: Diagnosis present

## 2018-02-21 DIAGNOSIS — R1110 Vomiting, unspecified: Secondary | ICD-10-CM | POA: Diagnosis not present

## 2018-02-21 DIAGNOSIS — D638 Anemia in other chronic diseases classified elsewhere: Secondary | ICD-10-CM | POA: Diagnosis present

## 2018-02-21 DIAGNOSIS — E11621 Type 2 diabetes mellitus with foot ulcer: Secondary | ICD-10-CM | POA: Diagnosis present

## 2018-02-21 DIAGNOSIS — Z66 Do not resuscitate: Secondary | ICD-10-CM | POA: Diagnosis not present

## 2018-02-21 DIAGNOSIS — E785 Hyperlipidemia, unspecified: Secondary | ICD-10-CM | POA: Diagnosis present

## 2018-02-21 DIAGNOSIS — Z931 Gastrostomy status: Secondary | ICD-10-CM

## 2018-02-21 DIAGNOSIS — E1140 Type 2 diabetes mellitus with diabetic neuropathy, unspecified: Secondary | ICD-10-CM | POA: Diagnosis not present

## 2018-02-21 DIAGNOSIS — Z7951 Long term (current) use of inhaled steroids: Secondary | ICD-10-CM | POA: Diagnosis not present

## 2018-02-21 DIAGNOSIS — I4891 Unspecified atrial fibrillation: Secondary | ICD-10-CM | POA: Diagnosis not present

## 2018-02-21 DIAGNOSIS — L89610 Pressure ulcer of right heel, unstageable: Secondary | ICD-10-CM | POA: Diagnosis not present

## 2018-02-21 DIAGNOSIS — R52 Pain, unspecified: Secondary | ICD-10-CM | POA: Diagnosis not present

## 2018-02-21 DIAGNOSIS — Z79890 Hormone replacement therapy (postmenopausal): Secondary | ICD-10-CM

## 2018-02-21 DIAGNOSIS — R633 Feeding difficulties: Secondary | ICD-10-CM | POA: Diagnosis not present

## 2018-02-21 DIAGNOSIS — R112 Nausea with vomiting, unspecified: Secondary | ICD-10-CM | POA: Diagnosis not present

## 2018-02-21 DIAGNOSIS — E876 Hypokalemia: Secondary | ICD-10-CM | POA: Diagnosis not present

## 2018-02-21 DIAGNOSIS — K9429 Other complications of gastrostomy: Secondary | ICD-10-CM | POA: Diagnosis not present

## 2018-02-21 DIAGNOSIS — L97523 Non-pressure chronic ulcer of other part of left foot with necrosis of muscle: Secondary | ICD-10-CM

## 2018-02-21 DIAGNOSIS — E871 Hypo-osmolality and hyponatremia: Secondary | ICD-10-CM | POA: Diagnosis not present

## 2018-02-21 DIAGNOSIS — E1151 Type 2 diabetes mellitus with diabetic peripheral angiopathy without gangrene: Secondary | ICD-10-CM | POA: Diagnosis not present

## 2018-02-21 LAB — URINALYSIS, ROUTINE W REFLEX MICROSCOPIC
Bilirubin Urine: NEGATIVE
GLUCOSE, UA: NEGATIVE mg/dL
KETONES UR: NEGATIVE mg/dL
Nitrite: NEGATIVE
PROTEIN: NEGATIVE mg/dL
SQUAMOUS EPITHELIAL / LPF: NONE SEEN
Specific Gravity, Urine: 1.014 (ref 1.005–1.030)
pH: 7 (ref 5.0–8.0)

## 2018-02-21 LAB — CBC WITH DIFFERENTIAL/PLATELET
Basophils Absolute: 0 10*3/uL (ref 0.0–0.1)
Basophils Relative: 0 %
EOS ABS: 0.1 10*3/uL (ref 0.0–0.7)
EOS PCT: 2 %
HCT: 30.1 % — ABNORMAL LOW (ref 39.0–52.0)
Hemoglobin: 9.5 g/dL — ABNORMAL LOW (ref 13.0–17.0)
LYMPHS ABS: 1.3 10*3/uL (ref 0.7–4.0)
LYMPHS PCT: 14 %
MCH: 25 pg — ABNORMAL LOW (ref 26.0–34.0)
MCHC: 31.6 g/dL (ref 30.0–36.0)
MCV: 79.2 fL (ref 78.0–100.0)
MONOS PCT: 5 %
Monocytes Absolute: 0.4 10*3/uL (ref 0.1–1.0)
Neutro Abs: 7.4 10*3/uL (ref 1.7–7.7)
Neutrophils Relative %: 79 %
PLATELETS: 414 10*3/uL — AB (ref 150–400)
RBC: 3.8 MIL/uL — ABNORMAL LOW (ref 4.22–5.81)
RDW: 16 % — ABNORMAL HIGH (ref 11.5–15.5)
WBC: 9.2 10*3/uL (ref 4.0–10.5)

## 2018-02-21 LAB — I-STAT CHEM 8, ED
BUN: 8 mg/dL (ref 6–20)
Calcium, Ion: 1.11 mmol/L — ABNORMAL LOW (ref 1.15–1.40)
Chloride: 89 mmol/L — ABNORMAL LOW (ref 101–111)
Creatinine, Ser: 0.7 mg/dL (ref 0.61–1.24)
GLUCOSE: 115 mg/dL — AB (ref 65–99)
HEMATOCRIT: 31 % — AB (ref 39.0–52.0)
HEMOGLOBIN: 10.5 g/dL — AB (ref 13.0–17.0)
POTASSIUM: 3.6 mmol/L (ref 3.5–5.1)
Sodium: 126 mmol/L — ABNORMAL LOW (ref 135–145)
TCO2: 22 mmol/L (ref 22–32)

## 2018-02-21 LAB — COMPREHENSIVE METABOLIC PANEL
ALBUMIN: 1.7 g/dL — AB (ref 3.5–5.0)
ALT: 11 U/L — AB (ref 17–63)
AST: 25 U/L (ref 15–41)
Alkaline Phosphatase: 125 U/L (ref 38–126)
Anion gap: 11 (ref 5–15)
BUN: 8 mg/dL (ref 6–20)
CO2: 22 mmol/L (ref 22–32)
CREATININE: 0.8 mg/dL (ref 0.61–1.24)
Calcium: 7.9 mg/dL — ABNORMAL LOW (ref 8.9–10.3)
Chloride: 90 mmol/L — ABNORMAL LOW (ref 101–111)
GFR calc Af Amer: >60 mL/min (ref >60)
GFR calc non Af Amer: >60 mL/min (ref >60)
GLUCOSE: 117 mg/dL — AB (ref 65–99)
Potassium: 3.5 mmol/L (ref 3.5–5.1)
SODIUM: 123 mmol/L — AB (ref 135–145)
Total Bilirubin: 0.7 mg/dL (ref 0.3–1.2)
Total Protein: 6.8 g/dL (ref 6.5–8.1)

## 2018-02-21 LAB — I-STAT CG4 LACTIC ACID, ED: LACTIC ACID, VENOUS: 2.17 mmol/L — AB (ref 0.5–1.9)

## 2018-02-21 LAB — TSH: TSH: 4.5 u[IU]/mL (ref 0.350–4.500)

## 2018-02-21 LAB — BASIC METABOLIC PANEL
Anion gap: 10 (ref 5–15)
BUN: 8 mg/dL (ref 6–20)
CALCIUM: 7.6 mg/dL — AB (ref 8.9–10.3)
CO2: 21 mmol/L — ABNORMAL LOW (ref 22–32)
Chloride: 95 mmol/L — ABNORMAL LOW (ref 101–111)
Creatinine, Ser: 0.67 mg/dL (ref 0.61–1.24)
GFR calc non Af Amer: >60 mL/min (ref >60)
Glucose, Bld: 101 mg/dL — ABNORMAL HIGH (ref 65–99)
POTASSIUM: 3.6 mmol/L (ref 3.5–5.1)
SODIUM: 126 mmol/L — AB (ref 135–145)

## 2018-02-21 LAB — NA AND K (SODIUM & POTASSIUM), RAND UR
Potassium Urine: 10 mmol/L
Sodium, Ur: <10 mmol/L

## 2018-02-21 LAB — PROTIME-INR
INR: 1.35
PROTHROMBIN TIME: 16.5 s — AB (ref 11.4–15.2)

## 2018-02-21 LAB — OSMOLALITY: OSMOLALITY: 268 mosm/kg — AB (ref 275–295)

## 2018-02-21 LAB — I-STAT TROPONIN, ED: Troponin i, poc: 0 ng/mL (ref 0.00–0.08)

## 2018-02-21 LAB — LACTIC ACID, PLASMA: LACTIC ACID, VENOUS: 1.2 mmol/L (ref 0.5–1.9)

## 2018-02-21 LAB — TYPE AND SCREEN
ABO/RH(D): O POS
Antibody Screen: NEGATIVE

## 2018-02-21 LAB — OSMOLALITY, URINE: OSMOLALITY UR: 132 mosm/kg — AB (ref 300–900)

## 2018-02-21 LAB — TROPONIN I: TROPONIN I: 0.03 ng/mL — AB (ref <0.03)

## 2018-02-21 MED ORDER — ENOXAPARIN SODIUM 40 MG/0.4ML ~~LOC~~ SOLN: 40.0000 mg | SUBCUTANEOUS | Status: DC

## 2018-02-21 MED ORDER — PIPERACILLIN-TAZOBACTAM 3.375 G IVPB
3.3750 g | Freq: Three times a day (TID) | INTRAVENOUS | Status: DC
  Administered 2018-02-21 – 2018-02-25 (×11): 3.375 g via INTRAVENOUS
  Filled 2018-02-21 (×12): qty 50

## 2018-02-21 MED ORDER — THIAMINE HCL 100 MG/ML IJ SOLN
100.0000 mg | Freq: Every day | INTRAMUSCULAR | Status: DC
  Administered 2018-02-22 – 2018-02-25 (×4): 100 mg via INTRAVENOUS
  Filled 2018-02-21 (×4): qty 2

## 2018-02-21 MED ORDER — SODIUM CHLORIDE 0.9 % IV BOLUS (SEPSIS)
1000.0000 mL | Freq: Once | INTRAVENOUS | Status: AC
Start: 2018-02-21 — End: 2018-02-21
  Administered 2018-02-21: 1000 mL via INTRAVENOUS

## 2018-02-21 MED ORDER — PIPERACILLIN-TAZOBACTAM 3.375 G IVPB 30 MIN
3.3750 g | Freq: Once | INTRAVENOUS | Status: AC
  Administered 2018-02-21: 3.375 g via INTRAVENOUS
  Filled 2018-02-21: qty 50

## 2018-02-21 MED ORDER — BUDESONIDE 0.25 MG/2ML IN SUSP
0.2500 mg | Freq: Two times a day (BID) | RESPIRATORY_TRACT | Status: DC
  Administered 2018-02-22 – 2018-02-28 (×11): 0.25 mg via RESPIRATORY_TRACT
  Filled 2018-02-21 (×13): qty 2

## 2018-02-21 MED ORDER — PANTOPRAZOLE SODIUM 40 MG IV SOLR: 40.0000 mg | Freq: Two times a day (BID) | INTRAVENOUS | Status: DC

## 2018-02-21 MED ORDER — FOLIC ACID 5 MG/ML IJ SOLN
1.0000 mg | Freq: Every day | INTRAMUSCULAR | Status: DC
  Administered 2018-02-22 – 2018-02-25 (×4): 1 mg via INTRAVENOUS
  Filled 2018-02-21 (×5): qty 0.2

## 2018-02-21 MED ORDER — IOPAMIDOL (ISOVUE-300) INJECTION 61%
INTRAVENOUS | Status: AC
  Administered 2018-02-21: 100 mL
  Filled 2018-02-21: qty 100

## 2018-02-21 MED ORDER — SODIUM CHLORIDE 0.9 % IV BOLUS (SEPSIS)
500.0000 mL | Freq: Once | INTRAVENOUS | Status: AC
  Administered 2018-02-21: 500 mL via INTRAVENOUS

## 2018-02-21 MED ORDER — SODIUM CHLORIDE 0.9 % IV SOLN
80.0000 mg | Freq: Once | INTRAVENOUS | Status: AC
  Administered 2018-02-21: 80 mg via INTRAVENOUS
  Filled 2018-02-21: qty 80

## 2018-02-21 MED ORDER — SODIUM CHLORIDE 0.9 % IV SOLN
Freq: Once | INTRAVENOUS | Status: AC
  Administered 2018-02-21: 19:00:00 via INTRAVENOUS

## 2018-02-21 MED ORDER — VANCOMYCIN HCL 10 G IV SOLR
1500.0000 mg | Freq: Two times a day (BID) | INTRAVENOUS | Status: DC
  Administered 2018-02-21 – 2018-02-22 (×2): 1500 mg via INTRAVENOUS
  Filled 2018-02-21 (×4): qty 1500

## 2018-02-21 MED ORDER — TRAMADOL HCL 50 MG PO TABS
50.0000 mg | ORAL_TABLET | Freq: Three times a day (TID) | ORAL | Status: DC | PRN
  Administered 2018-02-22 – 2018-02-24 (×2): 50 mg via ORAL
  Filled 2018-02-21 (×2): qty 1

## 2018-02-21 NOTE — H&P (Addendum)
Date: 02/21/2018               Patient Name:  Clinton Harrington MRN: [id number]  DOB: August 25, 1951 Age / Sex: 67 y.o., male   PCP: Eloisa Northern, MD         Medical Service: Internal Medicine Teaching Service         Attending Physician: Dr. Earl Lagos, MD    First Contact: Dr. Renaldo Reel Pager: [phone]  Second Contact: Dr. Samuella Cota Pager: [phone]       After Hours (After 5p/  First Contact Pager: [phone]  weekends / holidays): Second Contact Pager: [phone]   Chief Complaint: coffee ground emesis  History of Present Illness:  Clinton Harrington is a 67yo male with PMH of afib, CAD s/p CABG x4 in 08/2017, diabetes, HTN, PVD s/p L fem-pop bypass, HLD, COPD, and recent ex-lap in 09/2017 for pneumoperitoneum from leaking G-tube, and PEG tube who presents from [facility] with an episode of coffee ground emesis.  He had a prolonged hospitalization 9/15-10/19 when he presented with chest pain and dyspnea, and underwent CABG x4 and placement of RVAD x1 week on 08/28/2017. He was intubated and required pressors. Amiodarone was added for atrial fibrillation and DCCV was done on 9/20 for atrial flutter with hypotension. He did require pRBC transfusions for ABLA. Tracheostomy was placed 10/10 and he was discharged to LTAC on anidulafungin, meropenem, and vancomycin for suspected pneumonia and possible infection from multiple pressure injuries. Gastrotomy tube placed by IR on 10/24. He was re-admitted 10/30-11/1 for acute abdominal pain. He underwent ex-lap on 10/30 and required removal and replacement of G-tube due to leaking. He was discharged back to [facility] for ongoing care.  Since discharge, he reports that he had been working on his strength at the LTAC. He endorses 3 episodes of brown-colored emesis today, as well as mild abdominal pain for the last 2 weeks. He denies hematochezia or melena. He does endorse feeling lightheaded as well as palpitations. Endorses dysuria, subjective chills,  sacral pain, and decreased appetite. Denies current CP, SOB, night sweats, or leg swelling.  He denies tobacco use or other illicit drugs and endorses occasional alcohol use. He is typically ambulatory without assistance.  ED Course: - BP 85/69, HR 103, RR 20, temp 98, O2 98% on RA - Na 123, Ca 7.9, Cr 0.8. Hb 9.5. INR 1.35. Lactic acid 2.17. Troponin negative. - CT A/P with concern for sacral osteomyelitis. EKG with sinus tachycardia, prolonged QTc, and RBBB (new compared to prior). - Started on vanc/zosyn, IV PPI, and 1.5L IV NS bolus.  Meds:  Current Meds  Medication Sig  . acetaminophen (TYLENOL) 650 MG CR tablet Take 650 mg by mouth every 4 (four) hours as needed for pain.  . Amino Acids-Protein Hydrolys (FEEDING SUPPLEMENT, PRO-STAT SUGAR FREE 64,) LIQD Take 30 mLs by mouth 3 (three) times daily with meals.  Marland Kitchen atorvastatin (LIPITOR) 80 MG tablet Place 1 tablet (80 mg total) into feeding tube daily at 6 PM.  . budesonide (PULMICORT) 0.25 MG/2ML nebulizer solution Take 2 mLs (0.25 mg total) by nebulization 2 (two) times daily.  . collagenase (SANTYL) ointment Apply topically daily.  Marland Kitchen levothyroxine (SYNTHROID, LEVOTHROID) 25 MCG tablet Take 25 mcg by mouth daily before breakfast.  . lidocaine (LIDODERM) 5 % Place 1 patch onto the skin daily. Remove & Discard patch within 12 hours or as directed by MD  . metoCLOPramide (REGLAN) 5 MG tablet Take 5 mg by mouth 4 (  four) times daily.  . mirtazapine (REMERON) 15 MG tablet Take 15 mg by mouth at bedtime.  . ondansetron (ZOFRAN-ODT) 4 MG disintegrating tablet Take 4 mg by mouth every 6 (six) hours as needed for nausea or vomiting.  . pantoprazole sodium (PROTONIX) 40 mg/20 mL PACK Place 20 mLs (40 mg total) into feeding tube daily.  . tamsulosin (FLOMAX) 0.4 MG CAPS capsule Take 0.4 mg by mouth daily.  . traMADol (ULTRAM) 50 MG tablet Take 50 mg by mouth every 8 (eight) hours as needed for moderate pain.    Allergies: Allergies as of  02/21/2018 - Review Complete 02/21/2018  Allergen Reaction Noted  . Tylenol [acetaminophen] Other (See Comments) 02/21/2018  . Aspirin Other (See Comments) 08/05/2012  . Latex Rash 08/05/2012  . Oxycodone-acetaminophen Itching 08/07/2012   Past Medical History:  Diagnosis Date  . Arthritis   . Diabetes (HCC)   . Diabetes mellitus Jan. 1999  . High cholesterol   . Hypertension   . Myocardial infarct (HCC)   . Peripheral vascular disease (HCC)   . Seasonal allergies   . Smoker   . Urine incontinence    Family History:  Family History  Problem Relation Age of Onset  . Arthritis Mother   . Hypertension Mother   . Heart disease Maternal Grandmother   . Mental illness Maternal Grandmother    Social History:  - denies tobacco use or other illicit drugs and endorses occasional alcohol use - [place]  Review of Systems: A complete ROS was negative except as per HPI.  Physical Exam: Blood pressure 102/79, pulse 98, temperature 98 F (36.7 C), temperature source Oral, resp. rate 20, SpO2 100 %.  GEN: Chronically ill, thin male lying in bed. Alert and oriented. No acute distress. Edentulous HENT: [place]/AT. Moist mucous membranes. No visible lesions. EYES: PERRL. Sclera non-icteric. Conjunctiva clear. Bilateral medial pterygion. RESP: Clear to auscultation bilaterally. Fine crackles in bilateral bases. No increased work of breathing. CV: Normal rate and regular rhythm. No murmurs, gallops, or rubs. No LE edema. Well-healed midline sternotomy scar with 1x1cm round pink wound at the bottom end of the scar.  ABD: Soft. Mild suprapubic tenderness. Non-distended. Normoactive bowel sounds. G-tube site without evidence of erythema or tenderness. EXT: No edema. Warm. 2+ DP pulses. Bilateral feet in space boots. Bilateral heel pressure injuries, R worse than L    SACRUM: ~5x7cm unstageable sacral pressure injury with feculent matter, overlying dressing. Foul  smelling.  NEURO: Cranial nerves II-XII grossly intact. Able to lift all four extremities against gravity. No apparent audiovisual hallucinations. Speech fluent and appropriate. PSYCH: Patient is calm and pleasant. Appropriate affect. Well-groomed; speech is appropriate and on-subject.  Labs CBC Latest Ref Rng & Units 02/21/2018 02/21/2018 11/05/2017  WBC 4.0 - 10.5 K/uL - 9.2 7.4  Hemoglobin 13.0 - 17.0 g/dL 10.5(L) 9.5(L) 9.6(L)  Hematocrit 39.0 - 52.0 % 31.0(L) 30.1(L) 30.8(L)  Platelets 150 - 400 K/uL - 414(H) 304   CMP Latest Ref Rng & Units 02/21/2018 02/21/2018 11/05/2017  Glucose 65 - 99 mg/dL 308(M115(H) 578(I117(H) 696(E151(H)  BUN 6 - 20 mg/dL 8 8 13   Creatinine 0.61 - 1.24 mg/dL 9.520.70 8.410.80 3.24(M1.25(H)  Sodium 135 - 145 mmol/L 126(L) 123(L) 135  Potassium 3.5 - 5.1 mmol/L 3.6 3.5 3.2(L)  Chloride 101 - 111 mmol/L 89(L) 90(L) 100(L)  CO2 22 - 32 mmol/L - 22 27  Calcium 8.9 - 10.3 mg/dL - 7.9(L) 8.4(L)  Total Protein 6.5 - 8.1 g/dL - 6.8 -  Total Bilirubin 0.3 - 1.2 mg/dL - 0.7 -  Alkaline Phos 38 - 126 U/L - 125 -  AST 15 - 41 U/L - 25 -  ALT 17 - 63 U/L - 11(L) -   Lactic acid 2.17 Troponin negative INR 1.35  EKG: personally reviewed my interpretation is sinus tachycardia, prolonged QTc (523), RBBB (new compared to prior)  CT A/P 1. Large sacral decubitus ulcer extending to the underlying sacrum with associated osseous destruction concerning for associated osteomyelitis. 2. No acute process within the abdomen or pelvis. 3. Percutaneous gastrostomy tube balloon terminates in the gastric antrum/proximal duodenum. 4. Mild nonspecific wall thickening of the gallbladder fundus. Consider further evaluation with right upper quadrant ultrasound. 5. Nonspecific high attenuation along the posterior urinary bladder wall. Consider further evaluation with direct visualization as clinically indicated.  Assessment & Plan by Problem: Active Problems:   Hematemesis  Mr. Reiley is a 67yo male with PMH  of afib, CAD s/p CABG x4 in 08/2017, diabetes, HTN, PVD s/p L fem-pop bypass, HLD, COPD, and recent ex-lap in 09/2017 for pneumoperitoneum from leaking G-tube, and PEG tube who presents from SNF with an episode of coffee ground emesis. CT A/P on admission does not show an etiology for his hematemesis, but did note concern for sacral osteomyelitis. He was started on Vanc/zosyn in the ED. Afebrile and HDS. Also noted to be hyponatremic.  Hematemesis Hb stable at 9.5 on admission, baseline ~8.7-10.4. CT A/P without etiology for hematemesis. PEG tube site does not appear infected. Possible PUD? No recurrent episodes while inpatient. Denies hematochezia or melena. - Admit to telemetry - Continue IV protonix 40mg  BID, starting tomorrow - CBC in AM  Sacral osteomyelitis Lactic acidosis CT A/P with large sacral decubitus ulcer with concern for associated osteomyelitis. Lactic acid elevated at 2.17. S/p 1.5L NS bolus. Patient is hemodynamically stable and afebrile, however antibiotics were already started in the ED. Will continue for now and attempt to obtain cultures. - Trend lactic acid - Continue vancomycin and zosyn, per pharm - F/u BCx 3/16 -> pending  Hyponatremia Na 123 on admission, previously 132-135. - Urine osmolality - Serum osmolality - Urine Na and K - TSH - BMP in AM  Gallbladder wall thickening Noted on CT A/P. Will further evaluate with RUQ U/S - Can consider RUQ U/S, pending goals of care discussion  Multiple pressure injuries, including sacral, bilateral heels, and chest - Wound care consult; appreciate their assistance - Can consider bilateral foot MRI, pending goals of care discussion  Hx of atrial fibrillation/flutter In setting of CABG and acute illness. S/p DCCV 08/2017. Was on amiodarone, which has since been discontinued. Also was on home warfarin, however most recent note from CVTS in 12/2017 notes he has remained in NSR for 3 months post-op and may no longer require  warfarin. Currently in NSR, HR in 90s-100s. - Telemetry - Hold home warfarin in setting of hematemesis  CAD s/p CABG x4 in 08/2017 New RBBB For acute MI, RV infarct, and cardiogenic shock. Cath in 08/2017 showed chronic occlusion of RCA, LAD with proximal 90% stenosis and circumflex with 75-80% stenosis. Balloon pump was placed in cath lab prior to undergoing CABG. He also required percutaneous RVAD x1 week. Decannulated 10/1. Denies current chest pain. Initial troponin negative. EKG with new RBBB compared to prior. - Hold home plavix in setting of hematemesis - Trend troponins - EKG in AM  Hx of HTN BP soft on admission 85/69, responsive to IVF. Not on any home anti-hypertensives.  Per chart review, patient has soft-normal BP since recent prolonged hospitalizations. - Continue to monitor  Diabetes Not on any home medications. NCS/EMG shows chronic axonal sensorimotor polyneuropathy. Currently on gabapentin 900mg  BID and tramadol 50mg  TID PRN. Can consider cymbalta, aspercream, or lidocaine ointment. - CBG monitoring  COPD Home regimen includes budesonide nebulizer. Stable, no wheezing on exam. - Continue home budenoside nebulizer PRN for SOB or wheezing  Severe deconditioning, malnourishment Presenting from SNF, reports that he has been able to work with therapy while at [facility]. Has had multiple hospitalizations recently. - PT/OT eval - IV thiamine, folate  Goals of Care Currently full code. Has had recent prolonged hospitalization, sacral osteomyelitis, PEG tube with chronic debility.  - Would benefit from Palliative Care consult  Diet: NPO VTE PPx: SCDs Code Status: Full code Dispo: Admit patient to Inpatient with expected length of stay greater than 2 midnights.  Signed: Scherrie Gerlach, MD 02/21/2018, 6:01 PM  Pager: Demetrius Charity [phone]

## 2018-02-21 NOTE — ED Notes (Signed)
Pt to go to US prior to going upstairs.

## 2018-02-21 NOTE — Progress Notes (Signed)
Pharmacy Antibiotic Note  Clinton Harrington is a 67 y.o. 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  Pharmacy has been consulted for Vancomycin and Zosyn dosing.  Microbiology history: Coag Neg Staph in 1 out 2 blood cultures 09/05/17 and Coag Neg Staph in Trach asp on 09/18/17.   Currently, WBC is within normal limits. Patient is Afebrile. LA is elevated at 2.17. Last available weight from Oct 2018 was 80.5kg. SCr is 0.70 with CrCl >100 mL/hr.   Plan: Zosyn 3.375g IV every 8 hours - 4hr infusion. Vancomycin 1500 mg IV every 12 hours.  Monitor renal function, culture results and clinical status.  Follow-up updated weight when able to reweigh patient    Temp (24hrs), Avg:98 F (36.7 C), Min:98 F (36.7 C), Max:98 F (36.7 C)  Recent Labs  Lab 02/21/18 1146 02/21/18 1200 02/21/18 1201  WBC 9.2  --   --   CREATININE 0.80 0.70  --   LATICACIDVEN  --   --  2.17*    CrCl cannot be calculated (Unknown ideal weight.).    Allergies  Allergen Reactions  . Aspirin Other (See Comments)    Stomach upset  . Latex Rash  . Oxycodone-Acetaminophen Itching    Antimicrobials this admission: Vancomycin 3/16 >> Zosyn 3/16 >>  Dose adjustments this admission:  Microbiology results:   Thank you for allowing pharmacy to be a part of this patient's care.  Link SnufferJessica Rosmary Dionisio, PharmD, BCPS, BCCCP Clinical Pharmacist Clinical phone 02/21/2018 until 3:30PM [phone]- #[phone] After hours, please call #[phone] 02/21/2018 1:25 PM

## 2018-02-21 NOTE — Plan of Care (Signed)
Discussed with patient plan of care for the floor, pain management and condom catheter with some teach back displayed

## 2018-02-21 NOTE — ED Notes (Signed)
Patient transported to Ultrasound 

## 2018-02-21 NOTE — ED Notes (Signed)
Attempted to call report

## 2018-02-21 NOTE — ED Triage Notes (Signed)
To room via EMS.  Vomited x 1 this morning, coffee ground emesis.  Pale skin, warm & dry, pale mucous membranes.  Wounds to buttocks. Feet, and chest.  Draining wound on lower sternal incision.  EMS reports wounds to buttocks and feet.  Pt follow commands, speaks in short sentences.

## 2018-02-21 NOTE — ED Notes (Signed)
Phlebotomist in room drawing cultures.  °

## 2018-02-21 NOTE — ED Provider Notes (Signed)
[facility] EMERGENCY DEPARTMENT Provider Note   CSN: [id number] Arrival date & time: 02/21/18  1122     History   Chief Complaint Chief Complaint  Patient presents with  . Hematemesis    HPI Clinton Harrington is a 67 y.o. male presenting for evaluation of hematemesis.  Pt states he had 1 episode of coffee-ground emesis this morning. He reports continued nausea, but no vomiting since.  He reports mild abdominal pain.  He states he was feeling well yesterday.  He denies fevers, chills, chest pain, shortness of breath, urinary symptoms, abnormal bowel movements, however he cannot tell me when his last bowel movement was.  He is a resident of [place], had open heart surgery in September 2018.  PMH significant for DM, ACS, CHF, h/o bowel obstx, PEG tube placement.   HPI  Past Medical History:  Diagnosis Date  . Arthritis   . Diabetes (HCC)   . Diabetes mellitus Jan. 1999  . High cholesterol   . Hypertension   . Myocardial infarct (HCC)   . Peripheral vascular disease (HCC)   . Seasonal allergies   . Smoker   . Urine incontinence     Patient Active Problem List   Diagnosis Date Noted  . Hematemesis 02/21/2018  . Bowel obstruction (HCC) 10/07/2017  . Gastrojejunostomy tube dislodgement (HCC) 10/07/2017  . Dysphagia   . Acute encephalopathy   . HCAP (healthcare-associated pneumonia)   . Acute respiratory failure with hypoxemia (HCC)   . Acute pulmonary edema (HCC)   . CHF (congestive heart failure) (HCC)   . Heart failure (HCC) 09/08/2017  . Pressure injury of skin 09/06/2017  . Respiratory failure (HCC)   . STEMI (ST elevation myocardial infarction) (HCC) 08/23/2017  . Cardiogenic shock (HCC)   . Diabetic polyneuropathy associated with diabetes mellitus due to underlying condition (HCC) 03/28/2016  . PAD (peripheral artery disease) (HCC) 01/23/2016  . Extremity atherosclerosis with gangrene (HCC) 01/01/2016  . Hx of CABG   . Dyslipidemia,  goal LDL below 70   . Atherosclerosis of native arteries of the extremities with gangrene (HCC) 12/29/2015  . Ulcer of heel and midfoot (HCC) 11/09/2015  . Diabetes (HCC) 10/30/2012  . Hyperlipemia 10/30/2012  . CAD (coronary artery disease) 10/30/2012  . HTN (hypertension) 10/30/2012  . Tobacco use 10/30/2012    Past Surgical History:  Procedure Laterality Date  . CORONARY ARTERY BYPASS GRAFT N/A 08/28/2017   Procedure: CORONARY ARTERY BYPASS GRAFTING (CABG) times four using left internal mammary artery and right greater saphenous leg vein using endoscope.;  Surgeon: Kerin Perna, MD;  Location: [facility] OR;  Service: Open Heart Surgery;  Laterality: N/A;  . CORONARY STENT PLACEMENT  Jan.  1999  . FEMORAL-POPLITEAL BYPASS GRAFT Left 01/03/2016   Procedure: LEFT COMMON FEMORAL-BELOW KNEE POPLITEAL ARTERY BYPASS GRAFT USING NON REVERSED TRANSLOCATED SAPHENOUS VEIN GRAFT;  Surgeon: Pryor Ochoa, MD;  Location: [facility] OR;  Service: Vascular;  Laterality: Left;  . IABP INSERTION N/A 08/23/2017   Procedure: IABP Insertion;  Surgeon: Runell Gess, MD;  Location: [facility] INVASIVE CV LAB;  Service: Cardiovascular;  Laterality: N/A;  . INTRAOPERATIVE ARTERIOGRAM Left 01/03/2016   Procedure: INTRA OPERATIVE ARTERIOGRAM LEFT LEG;  Surgeon: Pryor Ochoa, MD;  Location: [facility] OR;  Service: Vascular;  Laterality: Left;  . IR GASTROSTOMY TUBE MOD SED  10/01/2017  . LAPAROTOMY N/A 10/07/2017   Procedure: EXPLORATORY LAPAROTOMY;  Surgeon: Abigail Miyamoto, MD;  Location: [facility] OR;  Service: General;  Laterality: N/A;  .  LEFT HEART CATH AND CORS/GRAFTS ANGIOGRAPHY N/A 08/23/2017   Procedure: LEFT HEART CATH AND CORS/GRAFTS ANGIOGRAPHY;  Surgeon: Runell Gess, MD;  Location: [facility] INVASIVE CV LAB;  Service: Cardiovascular;  Laterality: N/A;  . PERIPHERAL VASCULAR CATHETERIZATION N/A 01/01/2016   Procedure: Abdominal Aortogram w/Lower Extremity;  Surgeon: Fransisco Hertz, MD;  Location: [facility] INVASIVE CV LAB;  Service:  Cardiovascular;  Laterality: N/A;  . PERIPHERAL VASCULAR CATHETERIZATION Left 01/01/2016   Procedure: Peripheral Vascular Balloon Angioplasty;  Surgeon: Fransisco Hertz, MD;  Location: [facility] INVASIVE CV LAB;  Service: Cardiovascular;  Laterality: Left;  Popliteal artery  . PERIPHERAL VASCULAR CATHETERIZATION N/A 12/04/2016   Procedure: Abdominal Aortogram w/Lower Extremity;  Surgeon: Maeola Harman, MD;  Location: [facility] INVASIVE CV LAB;  Service: Cardiovascular;  Laterality: N/A;  . PERIPHERAL VASCULAR CATHETERIZATION Left 12/04/2016   Procedure: Peripheral Vascular Balloon Angioplasty;  Surgeon: Maeola Harman, MD;  Location: [facility] INVASIVE CV LAB;  Service: Cardiovascular;  Laterality: Left;  Fem_Pop bypass  . PLACEMENT OF CENTRIMAG VENTRICULAR ASSIST DEVICE Right 08/28/2017   Procedure: PLACEMENT OF CENTRIMAG VENTRICULAR ASSIST DEVICE;  Surgeon: Kerin Perna, MD;  Location: [facility] OR;  Service: Open Heart Surgery;  Laterality: Right;  . REMOVAL OF CENTRIMAG VENTRICULAR ASSIST DEVICE N/A 09/08/2017   Procedure: REMOVAL OF R-VENTRICULAR ASSIST DEVICE WITH CARDIOPULMONARY BYPASS;  Surgeon: Kerin Perna, MD;  Location: [facility] OR;  Service: Open Heart Surgery;  Laterality: N/A;  . REMOVAL OF GASTROSTOMY TUBE N/A 10/07/2017   Procedure: REMOVAL AND REPLACEMENT OF GASTROSTOMY TUBE;  Surgeon: Abigail Miyamoto, MD;  Location: [facility] OR;  Service: General;  Laterality: N/A;  . TEE WITHOUT CARDIOVERSION N/A 08/28/2017   Procedure: TRANSESOPHAGEAL ECHOCARDIOGRAM (TEE);  Surgeon: Donata Clay, Theron Arista, MD;  Location: [facility] OR;  Service: Open Heart Surgery;  Laterality: N/A;  . TEE WITHOUT CARDIOVERSION N/A 09/08/2017   Procedure: TRANSESOPHAGEAL ECHOCARDIOGRAM (TEE);  Surgeon: Donata Clay, Theron Arista, MD;  Location: [facility] OR;  Service: Open Heart Surgery;  Laterality: N/A;       Home Medications    Prior to Admission medications   Medication Sig Start Date End Date Taking? Authorizing Provider  acetaminophen (TYLENOL)  650 MG CR tablet Take 650 mg by mouth every 4 (four) hours as needed for pain.   Yes [provider]  Amino Acids-Protein Hydrolys (FEEDING SUPPLEMENT, PRO-STAT SUGAR FREE 64,) LIQD Take 30 mLs by mouth 3 (three) times daily with meals.   Yes [provider]  atorvastatin (LIPITOR) 80 MG tablet Place 1 tablet (80 mg total) into feeding tube daily at 6 PM. 09/26/17  Yes Conte, Tessa N, PA-C  budesonide (PULMICORT) 0.25 MG/2ML nebulizer solution Take 2 mLs (0.25 mg total) by nebulization 2 (two) times daily. 09/26/17  Yes Conte, Tessa N, PA-C  collagenase (SANTYL) ointment Apply topically daily. 09/27/17  Yes Conte, Tessa N, PA-C  levothyroxine (SYNTHROID, LEVOTHROID) 25 MCG tablet Take 25 mcg by mouth daily before breakfast.   Yes [provider]  lidocaine (LIDODERM) 5 % Place 1 patch onto the skin daily. Remove & Discard patch within 12 hours or as directed by MD   Yes [provider]  metoCLOPramide (REGLAN) 5 MG tablet Take 5 mg by mouth 4 (four) times daily.   Yes [provider]  mirtazapine (REMERON) 15 MG tablet Take 15 mg by mouth at bedtime.   Yes [provider]  ondansetron (ZOFRAN-ODT) 4 MG disintegrating tablet Take 4 mg by mouth every 6 (six) hours as needed for nausea or vomiting.  Yes [provider]  pantoprazole sodium (PROTONIX) 40 mg/20 mL PACK Place 20 mLs (40 mg total) into feeding tube daily. 09/27/17  Yes Conte, Tessa N, PA-C  tamsulosin (FLOMAX) 0.4 MG CAPS capsule Take 0.4 mg by mouth daily.   Yes [provider]  traMADol (ULTRAM) 50 MG tablet Take 50 mg by mouth every 8 (eight) hours as needed for moderate pain.    Yes [provider]  clopidogrel (PLAVIX) 75 MG tablet Take 1 tablet by mouth daily. Patient not taking: Reported on 02/21/2018 01/12/18   Maeola Harmanain, Brandon Christopher, MD  ondansetron [facility](ZOFRAN) 4 MG/2ML SOLN injection Inject 2 mLs (4 mg total) into the vein every 6 (six) hours as needed  for nausea or vomiting. Patient not taking: Reported on 02/21/2018 09/26/17   Sharlene Doryonte, Tessa N, PA-C  warfarin (COUMADIN) 3 MG tablet Take 1 tablet (3 mg total) by mouth one time only at 6 PM. Patient not taking: Reported on 02/21/2018 09/26/17   Sharlene Doryonte, Tessa N, PA-C    Family History Family History  Problem Relation Age of Onset  . Arthritis Mother   . Hypertension Mother   . Heart disease Maternal Grandmother   . Mental illness Maternal Grandmother     Social History Social History   Tobacco Use  . Smoking status: Current Every Day Smoker    Packs/day: 0.50    Years: 45.00    Pack years: 22.50    Types: Cigarettes  . Smokeless tobacco: Never Used  . Tobacco comment: 1/2 pk per day  Substance Use Topics  . Alcohol use: No    Alcohol/week: 0.0 oz  . Drug use: No     Allergies   Tylenol [acetaminophen]; Aspirin; Latex; and Oxycodone-acetaminophen   Review of Systems Review of Systems  Gastrointestinal: Positive for abdominal pain, nausea and vomiting.  All other systems reviewed and are negative.    Physical Exam Updated Vital Signs BP 95/76   Pulse 97   Temp 98 F (36.7 C) (Oral)   Resp 20   SpO2 100%   Physical Exam  Constitutional: He is oriented to person, place, and time.  Patient appears acutely ill and dehydrated.  HENT:  Head: Normocephalic and atraumatic.  Mouth/Throat: Mucous membranes are pale and dry.  Eyes: EOM are normal. Pupils are equal, round, and reactive to light.  Neck: Normal range of motion.  Cardiovascular: Regular rhythm and intact distal pulses.  tachycardic  Pulmonary/Chest: Effort normal and breath sounds normal. No respiratory distress. He has no wheezes.  Chronic nonhealing wound over sternum   Abdominal: Soft. He exhibits no distension and no mass. There is no tenderness. There is no guarding.  PEG tube without erythema or drainage at the site.  Mild tenderness palpation of lower abdomen.  No rigidity, guarding, or distention.   Musculoskeletal: Normal range of motion.  Radial and pedal pulses equal bilaterally.   Neurological: He is alert and oriented to person, place, and time.  Skin: Skin is warm and dry. There is pallor.  Psychiatric: He has a normal mood and affect.  Nursing note and vitals reviewed.    ED Treatments / Results  Labs (all labs ordered are listed, but only abnormal results are displayed) Labs Reviewed  COMPREHENSIVE METABOLIC PANEL - Abnormal; Notable for the following components:      Result Value   Sodium 123 (*)    Chloride 90 (*)    Glucose, Bld 117 (*)    Calcium 7.9 (*)    Albumin  1.7 (*)    ALT 11 (*)    All other components within normal limits  CBC WITH DIFFERENTIAL/PLATELET - Abnormal; Notable for the following components:   RBC 3.80 (*)    Hemoglobin 9.5 (*)    HCT 30.1 (*)    MCH 25.0 (*)    RDW 16.0 (*)    Platelets 414 (*)    All other components within normal limits  PROTIME-INR - Abnormal; Notable for the following components:   Prothrombin Time 16.5 (*)    All other components within normal limits  I-STAT CG4 LACTIC ACID, ED - Abnormal; Notable for the following components:   Lactic Acid, Venous 2.17 (*)    All other components within normal limits  I-STAT CHEM 8, ED - Abnormal; Notable for the following components:   Sodium 126 (*)    Chloride 89 (*)    Glucose, Bld 115 (*)    Calcium, Ion 1.11 (*)    Hemoglobin 10.5 (*)    HCT 31.0 (*)    All other components within normal limits  URINALYSIS, ROUTINE W REFLEX MICROSCOPIC  NA AND K (SODIUM & POTASSIUM), RAND UR  OSMOLALITY  OSMOLALITY, URINE  BASIC METABOLIC PANEL  I-STAT TROPONIN, ED  TYPE AND SCREEN    EKG  EKG Interpretation  Date/Time:  Saturday February 21 2018 11:39:14 EDT Ventricular Rate:  104 PR Interval:    QRS Duration: 143 QT Interval:  397 QTC Calculation: 523 R Axis:   25 Text Interpretation:  Sinus tachycardia abnromal eekg.  Right bundle branch block Inferior infarct, old  Abnormal lateral Q waves Confirmed by Carson, Courteney ([id number]) on 02/21/2018 12:16:32 PM       Radiology Ct Abdomen Pelvis W Contrast  Result Date: 02/21/2018 CLINICAL DATA:  Mid abdominal pain.  Nausea and vomiting. EXAM: CT ABDOMEN AND PELVIS WITH CONTRAST TECHNIQUE: Multidetector CT imaging of the abdomen and pelvis was performed using the standard protocol following bolus administration of intravenous contrast. CONTRAST:  <See Chart> ISOVUE-300 IOPAMIDOL (ISOVUE-300) INJECTION 61% COMPARISON:  CT abdomen pelvis 10/07/2017. FINDINGS: Lower chest: Normal heart size. Trace pericardial fluid. Dependent atelectasis within the bilateral lower lobes. Hepatobiliary: Liver is normal in size and contour. No intrahepatic or extrahepatic biliary ductal dilatation. No inflammatory changes about the gallbladder. Mild nonspecific wall thickening of the gallbladder fundus. Pancreas: Unremarkable Spleen: Unremarkable Adrenals/Urinary Tract: Adrenal glands are normal. Kidneys enhance symmetrically with contrast. Stable calcification within the midpole of the left kidney. No hydronephrosis. Mild wall thickening of the posterior aspect of the urinary bladder. Re-demonstrated high attenuation along the posterior aspect of the urinary bladder wall (image 75; series 3). Stomach/Bowel: No abnormal bowel wall thickening or evidence for bowel obstruction. No free fluid or free intraperitoneal air. Small hiatal hernia. Normal morphology of the stomach. Percutaneous gastrostomy tube terminates in the gastric antrum/proximal duodenum. No free fluid or free intraperitoneal air. Vascular/Lymphatic: Normal caliber abdominal aorta. Peripheral calcified atherosclerotic plaque. No retroperitoneal lymphadenopathy. Reproductive: Prostate is enlarged. Other: Bilateral fat containing inguinal hernias. Musculoskeletal: Lumbar spine degenerative changes. There is a decubitus ulcer overlying the mid and distal sacrum which extends to the sacral  bone where there is suggestion of cortical destruction. IMPRESSION: 1. Large sacral decubitus ulcer extending to the underlying sacrum with associated osseous destruction concerning for associated osteomyelitis. 2. No acute process within the abdomen or pelvis. 3. Percutaneous gastrostomy tube balloon terminates in the gastric antrum/proximal duodenum. 4. Mild nonspecific wall thickening of the gallbladder fundus. Consider further evaluation with right upper quadrant  ultrasound. 5. Nonspecific high attenuation along the posterior urinary bladder wall. Consider further evaluation with direct visualization as clinically indicated. Electronically Signed   By: Annia Belt M.D.   On: 02/21/2018 12:45    Procedures .Critical Care Performed by: Alveria Apley, PA-C Authorized by: Alveria Apley, PA-C   Critical care provider statement:    Critical care time (minutes):  30   Critical care time was exclusive of:  Separately billable procedures and treating other patients and teaching time   Critical care was necessary to treat or prevent imminent or life-threatening deterioration of the following conditions:  Metabolic crisis   Critical care was time spent personally by me on the following activities:  Development of treatment plan with patient or surrogate, discussions with consultants, evaluation of patient's response to treatment, examination of patient, obtaining history from patient or surrogate, review of old charts, re-evaluation of patient's condition, pulse oximetry, ordering and review of radiographic studies, ordering and review of laboratory studies and ordering and performing treatments and interventions   I assumed direction of critical care for this patient from another provider in my specialty: no   Comments:     Patient presenting appearing critically ill.  Labs show concerning sodium of 123, IVF started immediately.  Patient admitted for hyponattremia   (including critical care  time)  Medications Ordered in ED Medications  piperacillin-tazobactam (ZOSYN) IVPB 3.375 g (0 g Intravenous Stopped 02/21/18 1425)    Followed by  piperacillin-tazobactam (ZOSYN) IVPB 3.375 g (not administered)  vancomycin (VANCOCIN) 1,500 mg in sodium chloride 0.9 % 500 mL IVPB (1,500 mg Intravenous New Bag/Given 02/21/18 1425)  sodium chloride 0.9 % bolus 1,000 mL (0 mLs Intravenous Stopped 02/21/18 1425)  pantoprazole (PROTONIX) 80 mg in sodium chloride 0.9 % 100 mL IVPB (0 mg Intravenous Stopped 02/21/18 1341)  iopamidol (ISOVUE-300) 61 % injection (100 mLs  Contrast Given 02/21/18 1214)  sodium chloride 0.9 % bolus 500 mL (0 mLs Intravenous Stopped 02/21/18 1623)     Initial Impression / Assessment and Plan / ED Course  I have reviewed the triage vital signs and the nursing notes.  Pertinent labs & imaging results that were available during my care of the patient were reviewed by me and considered in my medical decision making (see chart for details).     Patient presenting for evaluation of coffee-ground emesis.  Upon arrival, patient appears acutely ill and pale.  He is tachycardic and blood pressure is soft.  Concern for acute GI bleed.  Will obtain labs including type and screen.  Liter fluid and PPI IV started.  Patient has a PEG tube, and reports of abdominal pain.  Will obtain CT abdomen for further evaluation.  Lactate mildly elevated at 2.17.  CMP shows concerning hyponatremia at 123.  Hemoglobin stable at 9.5, this is baseline for pt. patient has remained tachycardic and continues to be hypotensive.  CT scan shows sacral ulcer concerning for possible osteomyelitis.  Gallbladder wall thickening and abnormality of the bladder wall noted.  Will start IV antibiotics and admit for further management.  Discussed with internal med, pt to be admitted.   Final Clinical Impressions(s) / ED Diagnoses   Final diagnoses:  Hyponatremia  Wound of sacral region, initial encounter   Sepsis, due to unspecified organism [facility])    ED Discharge Orders    None       Alveria Apley, PA-C 02/21/18 1635    Mackuen, Cindee Salt, MD 02/22/18 1152

## 2018-02-21 NOTE — Progress Notes (Signed)
Paged to patient's bedside by RN for evaluation after patient stated he had some increased chest pressure upon arrival to the floor.   Patient seen laying comfortably in bed in no acute distress. When asked about chest pain, patient states he doesn't have any. He states what bothers him the most is his knees, which are in severe pain during interview.  Gen: Appears older than stated age in no acute distress, no diaphoresis CV/Pulm: RRR, lungs clear to auscultation bilaterally MSK: Bilateral knees without effusions or erythema.  Patient's recent EKG appears unchanged from admission EKG. Troponin elevated to 0.03 on first serum draw.   Will continue to trend overnight and consult cardiology if troponin level's continue to rise.

## 2018-02-21 NOTE — Progress Notes (Signed)
CRITICAL VALUE ALERT  Critical Value:  Troponin 0.03  Date & Time Notied:  02/21/18 2129  Provider Notified:  [facility] paged [id number]  Orders Received/Actions taken:  Pt.  Had 12 lead ekg preformed and had just arrived.

## 2018-02-22 ENCOUNTER — Inpatient Hospital Stay (HOSPITAL_COMMUNITY): Payer: PPO

## 2018-02-22 DIAGNOSIS — Z79891 Long term (current) use of opiate analgesic: Secondary | ICD-10-CM

## 2018-02-22 DIAGNOSIS — L89150 Pressure ulcer of sacral region, unstageable: Secondary | ICD-10-CM

## 2018-02-22 DIAGNOSIS — E871 Hypo-osmolality and hyponatremia: Secondary | ICD-10-CM

## 2018-02-22 DIAGNOSIS — Z951 Presence of aortocoronary bypass graft: Secondary | ICD-10-CM

## 2018-02-22 DIAGNOSIS — I2510 Atherosclerotic heart disease of native coronary artery without angina pectoris: Secondary | ICD-10-CM

## 2018-02-22 DIAGNOSIS — Z9889 Other specified postprocedural states: Secondary | ICD-10-CM

## 2018-02-22 DIAGNOSIS — Z7902 Long term (current) use of antithrombotics/antiplatelets: Secondary | ICD-10-CM

## 2018-02-22 DIAGNOSIS — L89629 Pressure ulcer of left heel, unspecified stage: Secondary | ICD-10-CM

## 2018-02-22 DIAGNOSIS — D62 Acute posthemorrhagic anemia: Secondary | ICD-10-CM

## 2018-02-22 DIAGNOSIS — I10 Essential (primary) hypertension: Secondary | ICD-10-CM

## 2018-02-22 DIAGNOSIS — Z931 Gastrostomy status: Secondary | ICD-10-CM

## 2018-02-22 DIAGNOSIS — K828 Other specified diseases of gallbladder: Secondary | ICD-10-CM

## 2018-02-22 DIAGNOSIS — E1151 Type 2 diabetes mellitus with diabetic peripheral angiopathy without gangrene: Secondary | ICD-10-CM

## 2018-02-22 DIAGNOSIS — Z8701 Personal history of pneumonia (recurrent): Secondary | ICD-10-CM

## 2018-02-22 DIAGNOSIS — I4510 Unspecified right bundle-branch block: Secondary | ICD-10-CM

## 2018-02-22 DIAGNOSIS — E1140 Type 2 diabetes mellitus with diabetic neuropathy, unspecified: Secondary | ICD-10-CM

## 2018-02-22 DIAGNOSIS — E46 Unspecified protein-calorie malnutrition: Secondary | ICD-10-CM

## 2018-02-22 DIAGNOSIS — I70302 Unspecified atherosclerosis of unspecified type of bypass graft(s) of the extremities, left leg: Secondary | ICD-10-CM

## 2018-02-22 DIAGNOSIS — E785 Hyperlipidemia, unspecified: Secondary | ICD-10-CM

## 2018-02-22 DIAGNOSIS — M6281 Muscle weakness (generalized): Secondary | ICD-10-CM

## 2018-02-22 DIAGNOSIS — Z7901 Long term (current) use of anticoagulants: Secondary | ICD-10-CM

## 2018-02-22 DIAGNOSIS — E876 Hypokalemia: Secondary | ICD-10-CM

## 2018-02-22 DIAGNOSIS — K920 Hematemesis: Secondary | ICD-10-CM

## 2018-02-22 DIAGNOSIS — J449 Chronic obstructive pulmonary disease, unspecified: Secondary | ICD-10-CM

## 2018-02-22 DIAGNOSIS — I4891 Unspecified atrial fibrillation: Secondary | ICD-10-CM

## 2018-02-22 DIAGNOSIS — Z79899 Other long term (current) drug therapy: Secondary | ICD-10-CM

## 2018-02-22 DIAGNOSIS — M869 Osteomyelitis, unspecified: Secondary | ICD-10-CM

## 2018-02-22 DIAGNOSIS — L89619 Pressure ulcer of right heel, unspecified stage: Secondary | ICD-10-CM

## 2018-02-22 DIAGNOSIS — E1169 Type 2 diabetes mellitus with other specified complication: Secondary | ICD-10-CM

## 2018-02-22 LAB — TROPONIN I: Troponin I: <0.03 ng/mL (ref <0.03)

## 2018-02-22 LAB — CBC
HEMATOCRIT: 24 % — AB (ref 39.0–52.0)
Hemoglobin: 7.9 g/dL — ABNORMAL LOW (ref 13.0–17.0)
MCH: 26 pg (ref 26.0–34.0)
MCHC: 32.9 g/dL (ref 30.0–36.0)
MCV: 78.9 fL (ref 78.0–100.0)
Platelets: 375 10*3/uL (ref 150–400)
RBC: 3.04 MIL/uL — ABNORMAL LOW (ref 4.22–5.81)
RDW: 16.3 % — AB (ref 11.5–15.5)
WBC: 7.6 10*3/uL (ref 4.0–10.5)

## 2018-02-22 LAB — BASIC METABOLIC PANEL
Anion gap: 8 (ref 5–15)
BUN: 6 mg/dL (ref 6–20)
CALCIUM: 7.5 mg/dL — AB (ref 8.9–10.3)
CO2: 20 mmol/L — ABNORMAL LOW (ref 22–32)
CREATININE: 0.6 mg/dL — AB (ref 0.61–1.24)
Chloride: 101 mmol/L (ref 101–111)
GFR calc Af Amer: >60 mL/min (ref >60)
GLUCOSE: 94 mg/dL (ref 65–99)
POTASSIUM: 3.2 mmol/L — AB (ref 3.5–5.1)
Sodium: 129 mmol/L — ABNORMAL LOW (ref 135–145)

## 2018-02-22 LAB — PROTIME-INR
INR: 1.54
PROTHROMBIN TIME: 18.3 s — AB (ref 11.4–15.2)

## 2018-02-22 LAB — APTT: aPTT: 45 seconds — ABNORMAL HIGH (ref 24–36)

## 2018-02-22 MED ORDER — MIRTAZAPINE 15 MG PO TABS
15.0000 mg | ORAL_TABLET | Freq: Every day | ORAL | Status: DC
  Administered 2018-02-22 – 2018-02-27 (×7): 15 mg via ORAL
  Filled 2018-02-22 (×10): qty 1

## 2018-02-22 MED ORDER — VANCOMYCIN HCL IN DEXTROSE 750-5 MG/150ML-% IV SOLN
750.0000 mg | Freq: Three times a day (TID) | INTRAVENOUS | Status: DC
Start: 2018-02-22 — End: 2018-02-23
  Administered 2018-02-22 – 2018-02-23 (×3): 750 mg via INTRAVENOUS
  Filled 2018-02-22 (×6): qty 150

## 2018-02-22 MED ORDER — PANTOPRAZOLE SODIUM 40 MG IV SOLR
40.0000 mg | Freq: Two times a day (BID) | INTRAVENOUS | Status: DC
  Administered 2018-02-22 – 2018-02-24 (×5): 40 mg via INTRAVENOUS
  Filled 2018-02-22 (×5): qty 40

## 2018-02-22 MED ORDER — POTASSIUM CHLORIDE 10 MEQ/100ML IV SOLN
10.0000 meq | INTRAVENOUS | Status: AC
  Administered 2018-02-22 (×4): 10 meq via INTRAVENOUS
  Filled 2018-02-22 (×4): qty 100

## 2018-02-22 MED ORDER — VANCOMYCIN HCL 10 G IV SOLR
1250.0000 mg | Freq: Two times a day (BID) | INTRAVENOUS | Status: DC
  Filled 2018-02-22: qty 1250

## 2018-02-22 MED ORDER — KCL IN DEXTROSE-NACL 20-5-0.9 MEQ/L-%-% IV SOLN
INTRAVENOUS | Status: DC
  Administered 2018-02-22 – 2018-02-25 (×6): via INTRAVENOUS
  Filled 2018-02-22 (×7): qty 1000

## 2018-02-22 NOTE — Discharge Summary (Addendum)
Name: Clinton Harrington MRN: [id number] DOB: [birth year] 67 y.o. PCP: Eloisa Northern, MD  Date of Admission: 02/21/2018 11:22 AM Date of Discharge: 02/28/2018 Attending Physician: Gust Rung, DO  Discharge Diagnosis: 1. Goals of care 2. Hematemesis 3. Sacral osteomyelitis 4. Bilateral calcaneal osteomyelitis 5. Hypotonic hyponatremia 6. Hx of atrial fibrillation/flutter 7. CAD s/p CABG x4  Active Problems:   Ulcer of foot, chronic, left, with necrosis of muscle (HCC)   Hematemesis   Sacral pressure ulcer   Normocytic anemia   Gastrointestinal tube present (HCC)   Hyponatremia   Sepsis (HCC)   Adult failure to thrive   Palliative care by specialist   DNR (do not resuscitate) discussion   Pain, generalized  Discharge Medications: Allergies as of 02/28/2018      Reactions   Tylenol [acetaminophen] Other (See Comments)   unknown   Aspirin Other (See Comments)   Stomach upset   Latex Rash   Oxycodone-acetaminophen Itching      Medication List    STOP taking these medications   acetaminophen 650 MG CR tablet Commonly known as:  TYLENOL   atorvastatin 80 MG tablet Commonly known as:  LIPITOR   clopidogrel 75 MG tablet Commonly known as:  PLAVIX   collagenase ointment Commonly known as:  SANTYL   feeding supplement (PRO-STAT SUGAR FREE 64) Liqd   levothyroxine 25 MCG tablet Commonly known as:  SYNTHROID, LEVOTHROID   lidocaine 5 % Commonly known as:  LIDODERM   metoCLOPramide 5 MG tablet Commonly known as:  REGLAN   ondansetron 4 MG disintegrating tablet Commonly known as:  ZOFRAN-ODT   ondansetron 4 MG/2ML Soln injection Commonly known as:  ZOFRAN   pantoprazole sodium 40 mg/20 mL Pack Commonly known as:  PROTONIX   tamsulosin 0.4 MG Caps capsule Commonly known as:  FLOMAX   traMADol 50 MG tablet Commonly known as:  ULTRAM   warfarin 3 MG tablet Commonly known as:  COUMADIN     TAKE these medications   budesonide 0.25 MG/2ML nebulizer  solution Commonly known as:  PULMICORT Take 2 mLs (0.25 mg total) by nebulization 2 (two) times daily.   diazepam 2 MG tablet Commonly known as:  VALIUM Take 1 tablet (2 mg total) by mouth every 8 (eight) hours as needed for anxiety.   mirtazapine 15 MG tablet Commonly known as:  REMERON Take 15 mg by mouth at bedtime.   morphine 10 MG/5ML solution Take 2.5 mLs (5 mg total) by mouth every 3 (three) hours as needed for severe pain.   morphine 10 MG/5ML solution Take 2.5 mLs (5 mg total) by mouth every 6 (six) hours.       Disposition and follow-up:   ClintonClinton Harrington was discharged from [facility] in [place] condition.  At the hospital follow up visit please address:  1.  PATIENT IS FULL COMFORT CARE. Discharged to residential hospice.  2.  Labs / imaging needed at time of follow-up: None  3.  Pending labs/ test needing follow-up: None  Follow-up Appointments:   Hospital Course by problem list: Active Problems:   Ulcer of foot, chronic, left, with necrosis of muscle (HCC)   Hematemesis   Sacral pressure ulcer   Normocytic anemia   Gastrointestinal tube present (HCC)   Hyponatremia   Sepsis (HCC)   Adult failure to thrive   Palliative care by specialist   DNR (do not resuscitate) discussion   Pain, generalized   1. Goals of Care Palliative Medicine was  consulted and after discussion with patient's lawyer, close friend, and patient himself, the decision was made for Clinton Harrington to be DNR/DNI with FULL COMFORT CARE. Antibiotics and IVF were discontinued. He was placed on a regimen of morphine and ativan. PO comfort feeds as tolerated. He was discharged to residential hospice at [facility].  2. Hematemesis Admitted from SNF with a couple episodes of hematemesis. No hematochezia or melena. Hb dropped to 7.8 from baseline of 8.7-10.4. CT A/P without etiology for hematemesis. Started on IV PPI and GI was consulted. EGD was performed, which showed no  source for bleeding, but it did show that PEG tube was dislodged with balloon obstructing the gastric outlet. This was thought to be the likely cause for his emesis. He had no recurrent episodes of emesis while inpatient. IR was consulted and they were able to adjust the PEG tube appropriately. He was able to tolerate a clear liquid diet. At this point in his hospitalization, the decision was made for him to be full comfort care.  3. Sacral osteomyelitis, probable bilateral calcaneal osteomyelitis Confirmed on CT A/P of known sacral pressure injury. Foot x-rays showed probable calcaneal osteomyelitis. WBC normal, afebrile and HDS. Vanc/zosyn started in ED and continued. BCx obtained after initiation of antibiotics were negative. Wound care consulted, who recommended hydrotherapy. At this point in his hospitalization, the decision was made for him to be full comfort care and antibiotics and IV fluids were discontinued.  4. Hypotonic hyponatremia Likely secondary to hypovolemia and reduced PO intake. Na 123 on admission, which improved with IVF administration. The decision was made for him to be full comfort care and IV fluids were discontinued.  5. Hx of atrial fibrillation/flutter In setting of CABG and acute illness on previous admission in 08/2017. Had DCCV in 08/2017 and was on amiodarone, which was discontinued on previous discharge. Was on warfarin, however most recent note from CVTS in 12/2017 suggests that he has been in NSR for 3 months post-op and may no longer require warfarin. He remained in NSR throughout his hospitalization. Warfarin was held throughout his hospitalization due to concern for hematemesis.  6. CAD s/p CABG x4 in 08/2017, new RBBB New RBBB noted on admission EKG compared to prior. Troponins peaked at 0.03. Patient denied chest pain. Plavix was held in setting of hematemesis.  Discharge Vitals:   BP (!) 78/53 (BP Location: Right Arm)   Pulse 96   Temp 98.7 F (37.1 C)  (Axillary)   Resp 18   Ht 5\' 8"  (1.727 m)   Wt 149 lb (67.6 kg)   SpO2 96%   BMI 22.66 kg/m   Pertinent Labs, Studies, and Procedures:  CBC Latest Ref Rng & Units 02/25/2018 02/24/2018 02/23/2018  WBC 4.0 - 10.5 K/uL 7.6 9.4 8.6  Hemoglobin 13.0 - 17.0 g/dL 7.3(L) 8.0(L) 7.8(L)  Hematocrit 39.0 - 52.0 % 24.3(L) 27.1(L) 23.6(L)  Platelets 150 - 400 K/uL 375 368 368   CMP Latest Ref Rng & Units 02/25/2018 02/24/2018 02/23/2018  Glucose 65 - 99 mg/dL 440(N) 027(O) 95  BUN 6 - 20 mg/dL <5(D) <6(U) 5(L)  Creatinine 0.61 - 1.24 mg/dL 4.40 3.47 4.25  Sodium 135 - 145 mmol/L 135 134(L) 130(L)  Potassium 3.5 - 5.1 mmol/L 3.4(L) 3.8 3.5  Chloride 101 - 111 mmol/L 108 108 102  CO2 22 - 32 mmol/L 19(L) 17(L) 21(L)  Calcium 8.9 - 10.3 mg/dL 7.2(L) 7.5(L) 7.4(L)  Total Protein 6.5 - 8.1 g/dL - - -  Total Bilirubin 0.3 -  1.2 mg/dL - - -  Alkaline Phos 38 - 126 U/L - - -  AST 15 - 41 U/L - - -  ALT 17 - 63 U/L - - -   Lactic acid 2.17 -> 1.2 Troponin 0.03 -> <0.03 x2 INR 1.35 BCx 3/16 -> negative TSH 4.5 UA with large leukocytes, too numerous to count WBC, few bacteria Serum osmolality 268, urine osmolality 132, urine sodium <10, urine K 10  CT A/P 02/21/2018 1. Large sacral decubitus ulcer extending to the underlying sacrum with associated osseous destruction concerning for associated osteomyelitis. 2. No acute process within the abdomen or pelvis. 3. Percutaneous gastrostomy tube balloon terminates in the gastric antrum/proximal duodenum. 4. Mild nonspecific wall thickening of the gallbladder fundus. Consider further evaluation with right upper quadrant ultrasound. 5. Nonspecific high attenuation along the posterior urinary bladder wall. Consider further evaluation with direct visualization as clinically indicated.  RUQ U/S 02/21/2018 Study within normal limits.  Right foot x-ray 02/22/2018 Left foot x-ray 02/22/2018 I suspect the patient's ulcer is posterior to the calcaneus.  The underlying bony irregularity and rarefication is suggestive of osteomyelitis. Recommend clinical correlation.  EGD 02/23/2018 - Normal esophagus. - Dislodged gastrostomy tube present characterized by balloon obstructing duod bulb. - Normal examined duodenum. - No specimens collected. - Hematemesis without active bleeding currently  Discharge Instructions: Discharge Instructions    Discharge instructions   Complete by:  As directed    Patient discharged DNR/DNI with full comfort care; his pain has been well controlled on morphine oral solution (5mg  q6hr scheduled + 5mg  q3hr PRN) while hospitalized. Do not use PEG tube.     Signed: Nyra MarketSvalina, Sue Mcalexander, MD 02/28/2018, 11:05 AM

## 2018-02-22 NOTE — Progress Notes (Signed)
OT Cancellation Note  Patient Details Name: Rhae HammockReginald E Weist MRN: [id number] DOB: Apr 22, 1951   Cancelled Treatment:    Reason Eval/Treat Not Completed: Other (comment) Per PT note: per RN, MD team is consulting palliative care. Will await medical plan and goals of care prior to completing OT eval. Acute OT to return as schedule allows and as appropriate.    Evette GeorgesLeonard, Anyeli Hockenbury Eva [phone] 02/22/2018, 12:55 PM

## 2018-02-22 NOTE — Progress Notes (Signed)
Subjective:  RN concerned about an episode of increased chest pressure overnight, however when evaluated at bedside, patient denied having chest pain. EKG unchanged compared to admission, and troponins 0.03 -> <0.03.  When seen this morning on rounds, Clinton Harrington was resting comfortably in bed. He endorsed mild lightheadedness and abdominal pain. Otherwise denies pain.  Objective:  Vital signs in last 24 hours: Vitals:   02/21/18 2100 02/21/18 2233 02/22/18 0043 02/22/18 0532  BP: 102/80 100/78 100/78 (!) 87/59  Pulse: (!) 102 (!) 103 (!) 103 99  Resp: 20 18 20 18   Temp: 98.2 F (36.8 C) (!) 97.5 F (36.4 C) (!) 97.5 F (36.4 C) 98.1 F (36.7 C)  TempSrc: Oral Oral Oral Oral  SpO2: 100%  98% 95%  Weight:  149 lb (67.6 kg)    Height:  5\' 8"  (1.727 m)     GEN: Chronically ill, thin male lying in bed. Alert and oriented. No acute distress. Edentulous RESP: Clear to auscultation bilaterally. No increased work of breathing, on RA. CV: Slightly tachycardic and regular rhythm. No murmurs, gallops, or rubs. No LE edema. Well-healed midline sternotomy scar with 1x1cm round pink wound at the bottom end of the scar. ABD: Soft. Mild suprapubic tenderness. Non-distended. Normoactive bowel sounds. G-tube site without evidence of erythema or tenderness. EXT: No edema. Warm. 2+ DP pulses. Bilateral feet in space boots. Bilateral heel pressure injuries, R worse than L SACRUM: ~5x7cm unstageable sacral pressure injury with feculent matter, overlying dressing. NEURO: Cranial nerves II-XII grossly intact. Able to lift all four extremities against gravity. No apparent audiovisual hallucinations. Speech fluent and appropriate. PSYCH: Patient is calm and pleasant. Appropriate affect. Well-groomed; speech is appropriate and on-subject.  Assessment/Plan:  Active Problems:   Hematemesis  Clinton Harrington is a 67yo male with PMH of afib, CAD s/p CABG x4 in 08/2017, diabetes, HTN, PVD s/p L fem-pop bypass,  HLD, COPD, and recent ex-lap in 09/2017 for pneumoperitoneum from leaking G-tube, and PEG tube who presents from SNF with an episode of coffee ground emesis, no further recurrence, although Hb has dropped to 7.9 from baseline ~8.5-10. CT A/P on admission does not show an etiology for his hematemesis, but did note concern for sacral osteomyelitis. He was started on Vanc/zosyn in the ED. Afebrile and HDS. Hyponatremia improving with fluids.  Hematemesis Hb 9.5 -> 7.9 this AM, baseline ~8.7-10.4. CT A/P without etiology for hematemesis. PEG tube site does not appear infected. Possible PUD? No recurrent episodes while inpatient. Denies hematochezia or melena. Endorses mild lightheadedness this AM. - GI consulted; appreciate their assistance - Continue telemetry - Continue IV protonix 40mg  BID - CBC in AM - NPO - Start D5W at 75cc/hr while NPO  Sacral osteomyelitis Lactic acidosis, resolved Confirmed on CT A/P. Lactic acid now normal, s/p 1.5L NS bolus. Normal WBC. Patient is hemodynamically stable and afebrile, however antibiotics were already started in the ED. Will continue for now and attempt to obtain cultures (although obtained after antibiotics had been initiated). - Continue vancomycin and zosyn, per pharm (3/16 -> ) - F/u BCx 3/16 -> pending  Hypotonic hyponatremia Na 129, improved from 126 yesterday after IVF. Urine osmolality and serum osmolality low, urine sodium appropriately low. TSH normal. Likely secondary to hypovolemia and low dietary intake. - BMP in AM  Hypokalemia K 3.2 this AM - D5W + 73mEq/L at 75cc/hr - IV K x4 doses - BMP in AM  Gallbladder wall thickening Noted on CT A/P. Further evaluation with RUQ U/S  was unremarkable.  Multiple pressure injuries, including sacral, bilateral heels, and chest - Wound care consulted; appreciate their assistance - Bilateral foot x-rays to evaluate for possible osteomyelitis of heels  Hx of atrial  fibrillation/flutter In setting of CABG and acute illness. S/p DCCV 08/2017. Was on amiodarone, which has since been discontinued. Also was on home warfarin, however most recent note from CVTS in 12/2017 notes he has remained in NSR for 3 months post-op and may no longer require warfarin. Currently in NSR, HR in 90s-100s. - Telemetry - Hold home warfarin in setting of hematemesis  CAD s/p CABG x4 in 08/2017 New RBBB Troponins peaked at 0.03. EKG without ischemic changes. Denies current chest pain.  - Hold home plavix in setting of hematemesis  Hx of HTN BP continues to be soft 87/59, this AM, responsive to IVF. Not on any home anti-hypertensives. Per chart review, patient has soft-normal BP since recent prolonged hospitalizations. - Continue to monitor - IVF, as above  Diabetes Not on any home medications. Currently on gabapentin 900mg  BID and tramadol 50mg  TID PRN for neuropathy. Can consider cymbalta, aspercream, or lidocaine ointment if still no relief. BG 100s-110s overnight. - CBG monitoring - Continue home tramadol 50mg  q8h PRN  COPD Home regimen includes budesonide nebulizer. Stable, no wheezing on exam. - Continue home budenoside nebulizer PRN for SOB or wheezing  Severe deconditioning, malnourishment Presenting from SNF, reports that [facility] Has had multiple hospitalizations recently. - PT/OT eval - IV thiamine, folate  Goals of Care Currently full code. Has had recent prolonged hospitalization, sacral osteomyelitis, PEG tube with chronic debility.  - Palliative Care consulted; appreciate their assistance  Dispo: Anticipated discharge in approximately 2-3 day(s).   Scherrie GerlachHuang, Danille Oppedisano, MD 02/22/2018, 6:20 AM Pager: Demetrius CharityP [phone]

## 2018-02-22 NOTE — Progress Notes (Signed)
Pharmacy Antibiotic Note  Clinton HammockReginald E Harrington is a 67 y.o. 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  Pharmacy has been consulted for Vancomycin and Zosyn dosing. MD now concerned for Sacral Osteomyelitis, so will target higher trough for Vanc.  Microbiology history: Coag Neg Staph in 1 out 2 blood cultures 09/05/17 and Coag Neg Staph in Trach asp on 09/18/17.   Currently, WBC is within normal limits. Patient is Afebrile. LA is wnl now 2.17>1.2. Weight from Oct 2018 was 80.5kg, now 67.6 kg. SCr is 0.70>0.6 and calculated CrCl 89.  Plan: Zosyn 3.375g IV every 8 hours - 4hr infusion. Vancomycin 750 mg IV every 8 hours for updated patient weight and improved renal function. (Goal 15-20) Monitor renal function, culture results and clinical status.  F/u VT as clinically necessary.  Height: 5\' 8"  (172.7 cm) Weight: 149 lb (67.6 kg) IBW/kg (Calculated) : 68.4  Temp (24hrs), Avg:98 F (36.7 C), Min:97.5 F (36.4 C), Max:98.3 F (36.8 C)  Recent Labs  Lab 02/21/18 1146 02/21/18 1200 02/21/18 1201 02/21/18 1511 02/21/18 1640 02/22/18 0751  WBC 9.2  --   --   --   --  7.6  CREATININE 0.80 0.70  --  0.67  --  0.60*  LATICACIDVEN  --   --  2.17*  --  1.2  --     Estimated Creatinine Clearance: 89.2 mL/min (A) (by C-G formula based on SCr of 0.6 mg/dL (L)).    Allergies  Allergen Reactions  . Tylenol [Acetaminophen] Other (See Comments)    unknown  . Aspirin Other (See Comments)    Stomach upset  . Latex Rash  . Oxycodone-Acetaminophen Itching    Antimicrobials this admission: Vancomycin 3/16 >> Zosyn 3/16 >>  Dose adjustments this admission:  Microbiology results:   Clinton Harrington, PharmD PGY1 Acute Care Pharmacy Resident Pager: [phone] 02/22/2018 2:46 PM

## 2018-02-22 NOTE — Progress Notes (Signed)
Date: 02/22/2018  Patient name: Clinton Harrington  Medical record number: [id number]  Date of birth: 17-Jun-1951   I have seen and evaluated Clinton Harrington and discussed their care with the Residency Team.  In brief, patient is a 67 year old male with a past medical history of A. fib, CAD status post CABG x4 in September 2018, diabetes, hypertension, PVD status post left femoropopliteal bypass, hyperlipidemia, COPD and recent ex lap in October 2018 for pneumoperitoneum from leaking G-tube who presented from [facility] with an episode of coffee-ground emesis yesterday.  Patient had a recent prolonged hospitalization from August 23, 2017 to September 26, 2017 patient presented with chest pain dyspnea and underwent a four-vessel CABG and placement of a right ventricular cyst device for 1 week.  His hospital course was complicated by atrial fibrillation requiring amiodarone and DCCV, hospital-acquired pneumonia, multiple pressure injuries as well as PRBC transfusions for acute blood loss anemia.  He had a gastrostomy tube placed by IR on October 01, 2017 and was readmitted from October 30 - November 1 for acute abdominal pain and was found to have a leaking G-tube and had a G-tube replacement.  He was discharged to [facility] after that.  Patient has been at [facility] since his discharge last year but presents today with 3 episodes of hematemesis associated with lightheadedness and palpitations and has had abdominal pain over the last 2 weeks.  Patient also complains of subjective chills, pain over sacral decubitus ulcer, decreased appetite and some dysuria.  No chest pain, no shortness of breath, no syncope, no headache, no blurry vision, no lower extremity swelling, no diarrhea.  Today patient does complain of some persistent abdominal pain as well as lightheadedness but has no other complaints at this time.  PMHx, Fam Hx, and/or Soc Hx : As per resident admit note  Vitals:   02/22/18 0800  02/22/18 1115  BP: 96/70 99/68  Pulse: (!) 101 (!) 102  Resp: (!) 21 (!) 25  Temp: 98.3 F (36.8 C) 98.1 F (36.7 C)  SpO2: 96% 95%   General: Awake alert and oriented x3, NAD CVS: Regular rate and rhythm, normal heart sounds Lungs: CTA bilaterally Abdomen: Soft, nondistended, mild suprapubic tenderness noted, normoactive bowel sounds Extremities: No edema noted, lower extremity dressing is intact, bilateral feet in boots Neuro: Cranial nerves II to XII grossly intact, awake alert and oriented x3 Psych: Mood and affect are appropriate  Assessment and Plan: I have seen and evaluated the patient as outlined above. I agree with the formulated Assessment and Plan as detailed in the residents' note, with the following changes:   1.  Acute blood loss anemia secondary to hematemesis: -Patient presented to the ED with 3 episodes of hematemesis from his LTAC and was noted to have a decrease in his hemoglobin to 7.9 from 9.5 on admission.  In the setting of his persistent abdominal pain as well as presence of a G-tube I suspect PUD is likely etiology possibly near the G-tube site.  Patient has not had any further episodes of hematemesis since admission. -We will continue with IV PPI twice daily -We will consult GI for possible EGD -Monitor CBC daily -Maintain n.p.o. for now -No further workup at this time  2.  Osteomyelitis: -Patient had a CT abdomen pelvis which showed likely sacral osteomyelitis at the site of his sacral decubitus ulcer.  He also likely has osteomyelitis in his heels bilaterally where he has chronic wounds. -We will continue with vancomycin and Zosyn for  now -Patient initially had an elevated lactic acid which is now normalized status post normal saline bolus -Patient does not have leukocytosis or fevers but given his recurrent hospitalizations and multiple comorbidities I suspect that he may be chronically immunosuppressed and he may not mount a normal immune response -We  will follow blood cultures -Wound care to be consulted regarding sacral as well as heel wounds -No further workup at this time  3.  Hypotonic hyponatremia: -Patient's sodium is now improved to 129.  I suspect that patient may have hypovolemic hyponatremia although I would expect his urine to be more concentrated -We will monitor his sodium closely -Patient's sodium is appropriately increased from 123-129 over the last 24 hours   Earl LagosNarendra, Sim Choquette, MD 3/17/201911:56 AM

## 2018-02-22 NOTE — Consult Note (Signed)
[facility] CONSULT Reason for consult: Hematemesis Referring Physician: Internal medicine teaching service.  Clinton Harrington is an 67 y.o. male.  HPI:.  Fortunately 67 year old male with multiple medical problems.  He has had CABG it was quite complicated last year with placement of right ventricular assist device for 1 week.  He was in heart failure intubated and required pressors.  He had atrial fibrillation and was placed on amiodarone.  He did require a tracheostomy.  He developed dysphagia,cute encephalopathy, pneumonia, pulmonary edema, heart failure, STEMI, cardiogenic shock.  Due to inability to eat he had placement of PEG.  Unfortunately several weeks later he developed a bowel obstruction and displacement and dislodgment of his PEG tube with pneumoperitoneum requiring laparotomy removal and placement of the gastrostomy tube.  [facility] Currently is a resident at [facility] and was admitted with hematemesis.  Apparently his PEG tube has been working okay.  He is on a multitude of medications that include Remeron, pantoprazole, metoclopramide.  Patient is able to eat but is not able to take in adequate calories and this is the reason why he has the PEG tube.  He denies any abdominal pain.  He was started on vancomycin and Zosyn in the emergency room.  CT scan showed a large sacral decubiti with concern for osteomyelitis and did not show any other abnormalities acute nature.  His gallbladder was somewhat edematous but subsequent gallbladder ultrasound showed stones and showed normal blood flow to the liver.  Labs revealed normal WBC and hemoglobin 7.9.  Hemoglobin was 10.5 yesterday.  Patient has not had any obvious bleeding out of his G-tube but did have vomiting what appeared to be coffee-ground material.  Past Medical History:  Diagnosis Date  . Arthritis   . Diabetes ([place])   . Diabetes mellitus Jan. 1999  .  High cholesterol   . Hypertension   . Myocardial infarct ([place])   . Peripheral vascular disease ([place])   . Seasonal allergies   . Smoker   . Urine incontinence     Past Surgical History:  Procedure Laterality Date  . CORONARY ARTERY BYPASS GRAFT N/A 08/28/2017   Procedure: CORONARY ARTERY BYPASS GRAFTING (CABG) times four using left internal mammary artery and right greater saphenous leg vein using endoscope.;  Surgeon: Ivin Poot, MD;  Location: [facility];  Service: Open Heart Surgery;  Laterality: N/A;  . CORONARY STENT PLACEMENT  Jan.  1999  . FEMORAL-POPLITEAL BYPASS GRAFT Left 01/03/2016   Procedure: LEFT COMMON FEMORAL-BELOW KNEE POPLITEAL ARTERY BYPASS GRAFT USING NON REVERSED TRANSLOCATED SAPHENOUS VEIN GRAFT;  Surgeon: Mal Misty, MD;  Location: [facility];  Service: Vascular;  Laterality: Left;  . IABP INSERTION N/A 08/23/2017   Procedure: IABP Insertion;  Surgeon: Lorretta Harp, MD;  Location: [facility];  Service: Cardiovascular;  Laterality: N/A;  . INTRAOPERATIVE ARTERIOGRAM Left 01/03/2016   Procedure: INTRA OPERATIVE ARTERIOGRAM LEFT LEG;  Surgeon: Mal Misty, MD;  Location: [facility];  Service: Vascular;  Laterality: Left;  . IR GASTROSTOMY TUBE MOD SED  10/01/2017  . LAPAROTOMY N/A 10/07/2017   Procedure: EXPLORATORY LAPAROTOMY;  Surgeon: Coralie Keens, MD;  Location: [facility];  Service: General;  Laterality: N/A;  . LEFT HEART CATH AND CORS/GRAFTS ANGIOGRAPHY N/A 08/23/2017   Procedure: LEFT HEART CATH AND CORS/GRAFTS ANGIOGRAPHY;  Surgeon: Lorretta Harp, MD;  Location: [facility];  Service: Cardiovascular;  Laterality: N/A;  .  PERIPHERAL VASCULAR CATHETERIZATION N/A 01/01/2016   Procedure: Abdominal Aortogram w/Lower Extremity;  Surgeon: Conrad Colp, MD;  Location: [facility];  Service: Cardiovascular;  Laterality: N/A;  . PERIPHERAL VASCULAR CATHETERIZATION Left 01/01/2016   Procedure: Peripheral Vascular Balloon Angioplasty;  Surgeon: Conrad [place], MD;  Location: [facility];  Service: Cardiovascular;  Laterality: Left;  Popliteal artery  . PERIPHERAL VASCULAR CATHETERIZATION N/A 12/04/2016   Procedure: Abdominal Aortogram w/Lower Extremity;  Surgeon: Waynetta Sandy, MD;  Location: [facility];  Service: Cardiovascular;  Laterality: N/A;  . PERIPHERAL VASCULAR CATHETERIZATION Left 12/04/2016   Procedure: Peripheral Vascular Balloon Angioplasty;  Surgeon: Waynetta Sandy, MD;  Location: [facility];  Service: Cardiovascular;  Laterality: Left;  Fem_Pop bypass  . PLACEMENT OF CENTRIMAG VENTRICULAR ASSIST DEVICE Right 08/28/2017   Procedure: PLACEMENT OF CENTRIMAG VENTRICULAR ASSIST DEVICE;  Surgeon: Ivin Poot, MD;  Location: [facility];  Service: Open Heart Surgery;  Laterality: Right;  . REMOVAL OF CENTRIMAG VENTRICULAR ASSIST DEVICE N/A 09/08/2017   Procedure: REMOVAL OF R-VENTRICULAR ASSIST DEVICE WITH CARDIOPULMONARY BYPASS;  Surgeon: Ivin Poot, MD;  Location: [facility];  Service: Open Heart Surgery;  Laterality: N/A;  . REMOVAL OF GASTROSTOMY TUBE N/A 10/07/2017   Procedure: REMOVAL AND REPLACEMENT OF GASTROSTOMY TUBE;  Surgeon: Coralie Keens, MD;  Location: [facility];  Service: General;  Laterality: N/A;  . TEE WITHOUT CARDIOVERSION N/A 08/28/2017   Procedure: TRANSESOPHAGEAL ECHOCARDIOGRAM (TEE);  Surgeon: Prescott Gum, Collier Salina, MD;  Location: [facility];  Service: Open Heart Surgery;  Laterality: N/A;  . TEE WITHOUT CARDIOVERSION N/A 09/08/2017   Procedure: TRANSESOPHAGEAL ECHOCARDIOGRAM (TEE);  Surgeon: Prescott Gum, Collier [place]ina, MD;  Location: [facility];  Service: Open Heart Surgery;  Laterality: N/A;    Family History  Problem Relation Age of Onset  . Arthritis Mother   . Hypertension Mother   . Heart disease Maternal Grandmother   . Mental illness Maternal Grandmother     Social History:  reports that he has been smoking cigarettes.  He has a 22.50 pack-year smoking history. he has never used  smokeless tobacco. He reports that he does not drink alcohol or use drugs.  Allergies:  Allergies  Allergen Reactions  . Tylenol [Acetaminophen] Other (See Comments)    unknown  . Aspirin Other (See Comments)    Stomach upset  . Latex Rash  . Oxycodone-Acetaminophen Itching    Medications; Prior to Admission medications   Medication Sig Start Date End Date Taking? Authorizing Provider  acetaminophen (TYLENOL) 650 MG CR tablet Take 650 mg by mouth every 4 (four) hours as needed for pain.   Yes [provider]  Amino Acids-Protein Hydrolys (FEEDING SUPPLEMENT, PRO-STAT SUGAR FREE 64,) LIQD Take 30 mLs by mouth 3 (three) times daily with meals.   Yes [provider]  atorvastatin (LIPITOR) 80 MG tablet Place 1 tablet (80 mg total) into feeding tube daily at 6 PM. 09/26/17  Yes Conte, Tessa N, PA-C  budesonide (PULMICORT) 0.25 MG/2ML nebulizer solution Take 2 mLs (0.25 mg total) by nebulization 2 (two) times daily. 09/26/17  Yes Conte, Tessa N, PA-C  collagenase (SANTYL) ointment Apply topically daily. 09/27/17  Yes Conte, Tessa N, PA-C  levothyroxine (SYNTHROID, LEVOTHROID) 25 MCG tablet Take 25 mcg by mouth daily before breakfast.   Yes [provider]  lidocaine (LIDODERM) 5 % Place 1 patch onto the skin daily. Remove & Discard patch within 12 hours or as directed by MD  Yes [provider]  metoCLOPramide (REGLAN) 5 MG tablet Take 5 mg by mouth 4 (four) times daily.   Yes [provider]  mirtazapine (REMERON) 15 MG tablet Take 15 mg by mouth at bedtime.   Yes [provider]  ondansetron (ZOFRAN-ODT) 4 MG disintegrating tablet Take 4 mg by mouth every 6 (six) hours as needed for nausea or vomiting.   Yes [provider]  pantoprazole sodium (PROTONIX) 40 mg/20 mL PACK Place 20 mLs (40 mg total) into feeding tube daily. 09/27/17  Yes Conte, Tessa N, PA-C  tamsulosin (FLOMAX) 0.4 MG CAPS capsule Take 0.4 mg by mouth daily.    Yes [provider]  traMADol (ULTRAM) 50 MG tablet Take 50 mg by mouth every 8 (eight) hours as needed for moderate pain.    Yes [provider]  clopidogrel (PLAVIX) 75 MG tablet Take 1 tablet by mouth daily. Patient not taking: Reported on 02/21/2018 01/12/18   Waynetta Sandy, MD  ondansetron [facility]) 4 MG/2ML SOLN injection Inject 2 mLs (4 mg total) into the vein every 6 (six) hours as needed for nausea or vomiting. Patient not taking: Reported on 02/21/2018 09/26/17   Elgie Collard, PA-C  warfarin (COUMADIN) 3 MG tablet Take 1 tablet (3 mg total) by mouth one time only at 6 PM. Patient not taking: Reported on 02/21/2018 09/26/17   Elgie Collard, PA-C   . budesonide  0.25 mg Nebulization BID  . folic acid  1 mg Intravenous Daily  . mirtazapine  15 mg Oral QHS  . pantoprazole (PROTONIX) IV  40 mg Intravenous BID  . thiamine  100 mg Intravenous Daily   PRN Meds traMADol Results for orders placed or performed during the hospital encounter of 02/21/18 (from the past 48 hour(s))  Comprehensive metabolic panel     Status: Abnormal   Collection Time: 02/21/18 11:46 AM  Result Value Ref Range   Sodium 123 (L) 135 - 145 mmol/L   Potassium 3.5 3.5 - 5.1 mmol/L   Chloride 90 (L) 101 - 111 mmol/L   CO2 22 22 - 32 mmol/L   Glucose, Bld 117 (H) 65 - 99 mg/dL   BUN 8 6 - 20 mg/dL   Creatinine, Ser 0.80 0.61 - 1.24 mg/dL   Calcium 7.9 (L) 8.9 - 10.3 mg/dL   Total Protein 6.8 6.5 - 8.1 g/dL   Albumin 1.7 (L) 3.5 - 5.0 g/dL   AST 25 15 - 41 U/L   ALT 11 (L) 17 - 63 U/L   Alkaline Phosphatase 125 38 - 126 U/L   Total Bilirubin 0.7 0.3 - 1.2 mg/dL   GFR calc non Af Amer >60 >60 mL/min   GFR calc Af Amer >60 >60 mL/min    Comment: (NOTE) The eGFR has been calculated using the CKD EPI equation. This calculation has not been validated in all clinical situations. eGFR's persistently <60 mL/min signify possible Chronic Kidney Disease.    Anion gap 11 5 - 15     Comment: Performed at [facility] [address] [place], [place] [address]  CBC with Differential/Platelet     Status: Abnormal   Collection Time: 02/21/18 11:46 AM  Result Value Ref Range   WBC 9.2 4.0 - 10.5 K/uL   RBC 3.80 (L) 4.22 - 5.81 MIL/uL   Hemoglobin 9.5 (L) 13.0 - 17.0 g/dL   HCT 30.1 (L) 39.0 - 52.0 %   MCV 79.2 78.0 - 100.0 fL   MCH  25.0 (L) 26.0 - 34.0 pg   MCHC 31.6 30.0 - 36.0 g/dL   RDW 16.0 (H) 11.5 - 15.5 %   Platelets 414 (H) 150 - 400 K/uL   Neutrophils Relative % 79 %   Neutro Abs 7.4 1.7 - 7.7 K/uL   Lymphocytes Relative 14 %   Lymphs Abs 1.3 0.7 - 4.0 K/uL   Monocytes Relative 5 %   Monocytes Absolute 0.4 0.1 - 1.0 K/uL   Eosinophils Relative 2 %   Eosinophils Absolute 0.1 0.0 - 0.7 K/uL   Basophils Relative 0 %   Basophils Absolute 0.0 0.0 - 0.1 K/uL    Comment: Performed at [facility] [address], [place], [place] [address]  Protime-INR     Status: Abnormal   Collection Time: 02/21/18 11:46 AM  Result Value Ref Range   Prothrombin Time 16.5 (H) 11.4 - 15.2 seconds   INR 1.35     Comment: Performed at [facility] [address], [place], [place] [address]  Type and screen [place]     Status: None   Collection Time: 02/21/18 11:49 AM  Result Value Ref Range   ABO/RH(D) O POS    Antibody Screen NEG    Sample Expiration      02/24/2018 Performed at [facility], [place] [address] [place], [place] [address]   I-stat troponin, ED     Status: None   Collection Time: 02/21/18 11:59 AM  Result Value Ref Range   Troponin i, poc 0.00 0.00 - 0.08 ng/mL   Comment 3            Comment: Due to the release kinetics of cTnI, a negative result within the first hours of the onset of symptoms does not rule out myocardial infarction with certainty. If myocardial infarction is still suspected, repeat the test at appropriate intervals.   I-Stat Chem 8, ED     Status: Abnormal   Collection Time: 02/21/18  12:00 PM  Result Value Ref Range   Sodium 126 (L) 135 - 145 mmol/L   Potassium 3.6 3.5 - 5.1 mmol/L   Chloride 89 (L) 101 - 111 mmol/L   BUN 8 6 - 20 mg/dL   Creatinine, Ser 0.70 0.61 - 1.24 mg/dL   Glucose, Bld 115 (H) 65 - 99 mg/dL   Calcium, Ion 1.11 (L) 1.15 - 1.40 mmol/L   TCO2 22 22 - 32 mmol/L   Hemoglobin 10.5 (L) 13.0 - 17.0 g/dL   HCT 31.0 (L) 39.0 - 52.0 %  I-Stat CG4 Lactic Acid, ED     Status: Abnormal   Collection Time: 02/21/18 12:01 PM  Result Value Ref Range   Lactic Acid, Venous 2.17 (HH) 0.5 - 1.9 mmol/L   Comment NOTIFIED PHYSICIAN   Osmolality     Status: Abnormal   Collection Time: 02/21/18  3:11 PM  Result Value Ref Range   Osmolality 268 (L) 275 - 295 mOsm/kg    Comment: Performed at [facility], [place] [address] [place], [place] [address]  Basic metabolic panel     Status: Abnormal   Collection Time: 02/21/18  3:11 PM  Result Value Ref Range   Sodium 126 (L) 135 - 145 mmol/L   Potassium 3.6 3.5 - 5.1 mmol/L   Chloride 95 (L) 101 - 111 mmol/L   CO2 21 (L) 22 - 32 mmol/L   Glucose, Bld 101 (H) 65 - 99 mg/dL   BUN 8  6 - 20 mg/dL   Creatinine, Ser 0.67 0.61 - 1.24 mg/dL   Calcium 7.6 (L) 8.9 - 10.3 mg/dL   GFR calc non Af Amer >60 >60 mL/min   GFR calc Af Amer >60 >60 mL/min    Comment: (NOTE) The eGFR has been calculated using the CKD EPI equation. This calculation has not been validated in all clinical situations. eGFR's persistently <60 mL/min signify possible Chronic Kidney Disease.    Anion gap 10 5 - 15    Comment: Performed at [facility] [address] [place], [place] [address]  Lactic acid, plasma     Status: None   Collection Time: 02/21/18  4:40 PM  Result Value Ref Range   Lactic Acid, Venous 1.2 0.5 - 1.9 mmol/L    Comment: Performed at [facility] [address], [place], [place] [address]  TSH     Status: None   Collection Time: 02/21/18  5:05 PM  Result Value Ref Range   TSH 4.500 0.350 - 4.500 uIU/mL     Comment: Performed by a 3rd Generation assay with a functional sensitivity of <=0.01 uIU/mL. Performed at [facility], [place] [address], [place], [place] [address]   Culture, blood (routine x 2)     Status: None (Preliminary result)   Collection Time: 02/21/18  5:28 PM  Result Value Ref Range   Specimen Description BLOOD RIGHT ANTECUBITAL    Special Requests      BOTTLES DRAWN AEROBIC AND ANAEROBIC Blood Culture results may not be optimal due to an excessive volume of blood received in culture bottles   Culture      NO GROWTH < 24 HOURS Performed at [facility] [address], [place], [place] [address]    Report Status PENDING   Urinalysis, Routine w reflex microscopic     Status: Abnormal   Collection Time: 02/21/18  6:28 PM  Result Value Ref Range   Color, Urine YELLOW YELLOW   APPearance HAZY (A) CLEAR   Specific Gravity, Urine 1.014 1.005 - 1.030   pH 7.0 5.0 - 8.0   Glucose, UA NEGATIVE NEGATIVE mg/dL   Hgb urine dipstick SMALL (A) NEGATIVE   Bilirubin Urine NEGATIVE NEGATIVE   Ketones, ur NEGATIVE NEGATIVE mg/dL   Protein, ur NEGATIVE NEGATIVE mg/dL   Nitrite NEGATIVE NEGATIVE   Leukocytes, UA LARGE (A) NEGATIVE   RBC / HPF 0-5 0 - 5 RBC/hpf   WBC, UA TOO NUMEROUS TO COUNT 0 - 5 WBC/hpf   Bacteria, UA FEW (A) NONE SEEN   Squamous Epithelial / LPF NONE SEEN NONE SEEN    Comment: Performed at [facility], [place] [address], [place], [place] [address]  Na and K (sodium & potassium), rand urine     Status: None   Collection Time: 02/21/18  6:29 PM  Result Value Ref Range   Sodium, Ur <10 mmol/L   Potassium Urine 10 mmol/L    Comment: Performed at [facility], [place] [address], [place], [place] [address]  Osmolality, urine     Status: Abnormal   Collection Time: 02/21/18  6:29 PM  Result Value Ref Range   Osmolality, Ur 132 (L) 300 - 900 mOsm/kg    Comment: Performed at [facility] [address], [place],  [address]  Culture, blood  (routine x 2)     Status: None (Preliminary result)   Collection Time: 02/21/18  6:30 PM  Result  Value Ref Range   Specimen Description BLOOD RIGHT HAND    Special Requests IN PEDIATRIC BOTTLE Blood Culture adequate volume    Culture      NO GROWTH < 24 HOURS Performed at [facility] [address], [place], [place] [address]    Report Status PENDING   Troponin I (q 6hr x 3)     Status: Abnormal   Collection Time: 02/21/18  8:27 PM  Result Value Ref Range   Troponin I 0.03 (HH) <0.03 ng/mL    Comment: CRITICAL RESULT CALLED TO, READ BACK BY AND VERIFIED WITH: Judy Pimple [facility] 02/21/18 [id number] Performed at [facility], [place] [address], [place], [place] [address]   Troponin I (q 6hr x 3)     Status: None   Collection Time: 02/22/18 12:12 AM  Result Value Ref Range   Troponin I <0.03 <0.03 ng/mL    Comment: Performed at [facility] [address], [place], [place] [address]  Troponin I     Status: None   Collection Time: 02/22/18  2:50 AM  Result Value Ref Range   Troponin I <0.03 <0.03 ng/mL    Comment: Performed at [facility] [address] [place], [place] [address]  Basic metabolic panel     Status: Abnormal   Collection Time: 02/22/18  7:51 AM  Result Value Ref Range   Sodium 129 (L) 135 - 145 mmol/L   Potassium 3.2 (L) 3.5 - 5.1 mmol/L   Chloride 101 101 - 111 mmol/L   CO2 20 (L) 22 - 32 mmol/L   Glucose, Bld 94 65 - 99 mg/dL   BUN 6 6 - 20 mg/dL   Creatinine, Ser 0.60 (L) 0.61 - 1.24 mg/dL   Calcium 7.5 (L) 8.9 - 10.3 mg/dL   GFR calc non Af Amer >60 >60 mL/min   GFR calc Af Amer >60 >60 mL/min    Comment: (NOTE) The eGFR has been calculated using the CKD EPI equation. This calculation has not been validated in all clinical situations. eGFR's persistently <60 mL/min signify possible Chronic Kidney Disease.    Anion gap 8 5 - 15    Comment: Performed at [facility] [address], [place], [place] [address]  CBC     Status:  Abnormal   Collection Time: 02/22/18  7:51 AM  Result Value Ref Range   WBC 7.6 4.0 - 10.5 K/uL   RBC 3.04 (L) 4.22 - 5.81 MIL/uL   Hemoglobin 7.9 (L) 13.0 - 17.0 g/dL    Comment: DELTA CHECK NOTED REPEATED TO VERIFY    HCT 24.0 (L) 39.0 - 52.0 %   MCV 78.9 78.0 - 100.0 fL   MCH 26.0 26.0 - 34.0 pg   MCHC 32.9 30.0 - 36.0 g/dL   RDW 16.3 (H) 11.5 - 15.5 %   Platelets 375 150 - 400 K/uL    Comment: Performed at [facility], [place] [address] [place], [place] [address]  Protime-INR     Status: Abnormal   Collection Time: 02/22/18  7:51 AM  Result Value Ref Range   Prothrombin Time 18.3 (H) 11.4 - 15.2 seconds   INR 1.54     Comment: Performed at [facility] [address], [place], [place] [address]  APTT     Status: Abnormal   Collection Time: 02/22/18  7:51 AM  Result Value Ref Range   aPTT 45 (H) 24 - 36 seconds  Comment:        IF BASELINE aPTT IS ELEVATED, SUGGEST PATIENT RISK ASSESSMENT BE USED TO DETERMINE APPROPRIATE ANTICOAGULANT THERAPY. Performed at [facility], [place] [address], [place], [place] [address]     Ct Abdomen Pelvis W Contrast  Result Date: 02/21/2018 CLINICAL DATA:  Mid abdominal pain.  Nausea and vomiting. EXAM: CT ABDOMEN AND PELVIS WITH CONTRAST TECHNIQUE: Multidetector CT imaging of the abdomen and pelvis was performed using the standard protocol following bolus administration of intravenous contrast. CONTRAST:  <See Chart> ISOVUE-300 IOPAMIDOL (ISOVUE-300) INJECTION 61% COMPARISON:  CT abdomen pelvis 10/07/2017. FINDINGS: Lower chest: Normal heart size. Trace pericardial fluid. Dependent atelectasis within the bilateral lower lobes. Hepatobiliary: Liver is normal in size and contour. No intrahepatic or extrahepatic biliary ductal dilatation. No inflammatory changes about the gallbladder. Mild nonspecific wall thickening of the gallbladder fundus. Pancreas: Unremarkable Spleen: Unremarkable Adrenals/Urinary Tract: Adrenal glands are  normal. Kidneys enhance symmetrically with contrast. Stable calcification within the midpole of the left kidney. No hydronephrosis. Mild wall thickening of the posterior aspect of the urinary bladder. Re-demonstrated high attenuation along the posterior aspect of the urinary bladder wall (image 75; series 3). Stomach/Bowel: No abnormal bowel wall thickening or evidence for bowel obstruction. No free fluid or free intraperitoneal air. Small hiatal hernia. Normal morphology of the stomach. Percutaneous gastrostomy tube terminates in the gastric antrum/proximal duodenum. No free fluid or free intraperitoneal air. Vascular/Lymphatic: Normal caliber abdominal aorta. Peripheral calcified atherosclerotic plaque. No retroperitoneal lymphadenopathy. Reproductive: Prostate is enlarged. Other: Bilateral fat containing inguinal hernias. Musculoskeletal: Lumbar spine degenerative changes. There is a decubitus ulcer overlying the mid and distal sacrum which extends to the sacral bone where there is suggestion of cortical destruction. IMPRESSION: 1. Large sacral decubitus ulcer extending to the underlying sacrum with associated osseous destruction concerning for associated osteomyelitis. 2. No acute process within the abdomen or pelvis. 3. Percutaneous gastrostomy tube balloon terminates in the gastric antrum/proximal duodenum. 4. Mild nonspecific wall thickening of the gallbladder fundus. Consider further evaluation with right upper quadrant ultrasound. 5. Nonspecific high attenuation along the posterior urinary bladder wall. Consider further evaluation with direct visualization as clinically indicated. Electronically Signed   By: Lovey Newcomer M.D.   On: 02/21/2018 12:45   US Abdomen Limited Ruq  Result Date: 02/21/2018 CLINICAL DATA:  Abdominal pain EXAM: ULTRASOUND ABDOMEN LIMITED RIGHT UPPER QUADRANT COMPARISON:  CT abdomen and pelvis February 21, 2018 FINDINGS: Gallbladder: No gallstones or wall thickening visualized. There  is no pericholecystic fluid. No sonographic Murphy sign noted by sonographer. Common bile duct: Diameter: 4 mm. There is no intrahepatic, common hepatic, or common bile duct dilatation. Liver: No focal lesion identified. Within normal limits in parenchymal echogenicity. Portal vein is patent on color Doppler imaging with normal direction of blood flow towards the liver. IMPRESSION: Study within normal limits. Electronically Signed   By: Lowella Grip III M.D.   On: 02/21/2018 21:37   ROS: Denies preceding nausea or abdominal pain.  Review of systems otherwise quite extensive as noted in H&P            Blood pressure 99/68, pulse (!) 102, temperature 98.1 F (36.7 C), temperature source Oral, resp. rate (!) 25, height _0  (1.727 m), weight 67.6 kg (149 lb), SpO2 95 %.  Physical exam:   General--frail-appearing very pale white male ENT--nonicteric Neck--supple with no gross masses or lymphadenopathy Heart--large CABG scar regular rate and rhythm without murmurs or gallops Lungs--clear Abdomen--PEG tube in place site looks good  no bleeding around site good bowel sounds and nontender Psych--alert and oriented appears to answer questions appropriately   Assessment: 1.  Hematemesis and acute drop in hemoglobin.  Etiology unclear.  I think he really should have an EGD.  Have discussed this with him in detail he is agreeable.  He has had a significant drop in hemoglobin.  Anticoagulated due to A. fib 2.  Multiple decubiti on sacrum and feet with probable sacral osteomyelitis 3.  Lactic acidosis probably due to the osteomyelitis.  Patient is currently on vancomycin and Zosyn 4.  Hyponatremia etiology unclear workup underway 5.  History of atrial fibrillation in conjunction with CABG.  Has been NSR for several months.  Has been anticoagulated but in view of GI bleed this is on hold. 6.  Malnutrition.  Patient able to eat some but is required PEG tube to maintain nutrition 7.   Diabetes 8.  CAD status post CABG with temporary placement of right ventricular assist devise subsequent with history of respiratory failure and congestive heart failure  Plan: 1.  Continue to hold Coumadin.  INR currently 1.5. 2.  We will proceed with EGD to evaluate his hematemesis in the morning.  Have discussed this in detail with the patient.  Hopefully we will be able to reinitiate tube feedings   Nancy Fetter 02/22/2018, 2:33 PM   This note was created using voice recognition software and minor errors may Have occurred unintentionally. Pager: [phone] If no answer or after hours call [phone]

## 2018-02-22 NOTE — Progress Notes (Addendum)
Pharmacy Antibiotic Note  Clinton Harrington is a 67 y.o. 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  Pharmacy has been consulted for Vancomycin and Zosyn dosing. MD now concerned for Sacral Osteomyelitis, so will target higher trough for Vanc.  Microbiology history: Coag Neg Staph in 1 out 2 blood cultures 09/05/17 and Coag Neg Staph in Trach asp on 09/18/17.   Currently, WBC is within normal limits. Patient is Afebrile. LA is wnl now 2.17>1.2. Weight from Oct 2018 was 80.5kg, now 67.6 kg. SCr is 0.70>0.6 with CrCl >100 mL/hr.   Plan: Zosyn 3.375g IV every 8 hours - 4hr infusion. Vancomycin 750 mg IV every 8 hours for updated patient weight and improved renal function. (Goal 15-20) Monitor renal function, culture results and clinical status.  F/u VT as clinically necessary.  Height: 5\' 8"  (172.7 cm) Weight: 149 lb (67.6 kg) IBW/kg (Calculated) : 68.4  Temp (24hrs), Avg:97.9 F (36.6 C), Min:97.5 F (36.4 C), Max:98.3 F (36.8 C)  Recent Labs  Lab 02/21/18 1146 02/21/18 1200 02/21/18 1201 02/21/18 1511 02/21/18 1640 02/22/18 0751  WBC 9.2  --   --   --   --  7.6  CREATININE 0.80 0.70  --  0.67  --  0.60*  LATICACIDVEN  --   --  2.17*  --  1.2  --     Estimated Creatinine Clearance: 89.2 mL/min (A) (by C-G formula based on SCr of 0.6 mg/dL (L)).    Allergies  Allergen Reactions  . Tylenol [Acetaminophen] Other (See Comments)    unknown  . Aspirin Other (See Comments)    Stomach upset  . Latex Rash  . Oxycodone-Acetaminophen Itching    Antimicrobials this admission: Vancomycin 3/16 >> Zosyn 3/16 >>  Dose adjustments this admission:  Microbiology results:   Clinton Harrington Clinton Harrington, PharmD PGY1 Acute Care Pharmacy Resident Pager: [phone] 02/22/2018 9:42 AM

## 2018-02-22 NOTE — H&P (View-Only) (Signed)
[facility] CON[facility] Reason for consult: Hematemesis Referring Physician: Internal medicine teaching service.  Clinton Harrington is an 67 y.o. male.  HPI:.  Fortunately 67 year old male with multiple medical problems.  He has had CABG it was quite complicated last year with placement of right ventricular assist device for 1 week.  He was in heart failure intubated and required pressors.  He had atrial fibrillation and was placed on amiodarone.  He did require a tracheostomy.  He developed dysphagia,cute encephalopathy, pneumonia, pulmonary edema, heart failure, STEMI, cardiogenic shock.  Due to inability to eat he had placement of PEG.  Unfortunately several weeks later he developed a bowel obstruction and displacement and dislodgment of his PEG tube with pneumoperitoneum requiring laparotomy removal and placement of the gastrostomy tube.  [facility] Currently is a resident at [facility] and was admitted with hematemesis.  Apparently his PEG tube has been working okay.  He is on a multitude of medications that include Remeron, pantoprazole, metoclopramide.  Patient is able to eat but is not able to take in adequate calories and this is the reason why he has the PEG tube.  He denies any abdominal pain.  He was started on vancomycin and Zosyn in the emergency room.  CT scan showed a large sacral decubiti with concern for osteomyelitis and did not show any other abnormalities acute nature.  His gallbladder was somewhat edematous but subsequent gallbladder ultrasound showed stones and showed normal blood flow to the liver.  Labs revealed normal WBC and hemoglobin 7.9.  Hemoglobin was 10.5 yesterday.  Patient has not had any obvious bleeding out of his G-tube but did have vomiting what appeared to be coffee-ground material.  Past Medical History:  Diagnosis Date  . Arthritis   . Diabetes ([place])   . Diabetes mellitus Jan. 1999  .  High cholesterol   . Hypertension   . Myocardial infarct ([place])   . Peripheral vascular disease ([place])   . Seasonal allergies   . Smoker   . Urine incontinence     Past Surgical History:  Procedure Laterality Date  . CORONARY ARTERY BYPASS GRAFT N/A 08/28/2017   Procedure: CORONARY ARTERY BYPASS GRAFTING (CABG) times four using left internal mammary artery and right greater saphenous leg vein using endoscope.;  Surgeon: Ivin Poot, MD;  Location: [facility];  Service: Open Heart Surgery;  Laterality: N/A;  . CORONARY STENT PLACEMENT  Jan.  1999  . FEMORAL-POPLITEAL BYPASS GRAFT Left 01/03/2016   Procedure: LEFT COMMON FEMORAL-BELOW KNEE POPLITEAL ARTERY BYPASS GRAFT USING NON REVERSED TRANSLOCATED SAPHENOUS VEIN GRAFT;  Surgeon: Mal Misty, MD;  Location: [facility];  Service: Vascular;  Laterality: Left;  . IABP INSERTION N/A 08/23/2017   Procedure: IABP Insertion;  Surgeon: Lorretta Harp, MD;  Location: [facility];  Service: Cardiovascular;  Laterality: N/A;  . INTRAOPERATIVE ARTERIOGRAM Left 01/03/2016   Procedure: INTRA OPERATIVE ARTERIOGRAM LEFT LEG;  Surgeon: Mal Misty, MD;  Location: [facility];  Service: Vascular;  Laterality: Left;  . IR GASTROSTOMY TUBE MOD SED  10/01/2017  . LAPAROTOMY N/A 10/07/2017   Procedure: EXPLORATORY LAPAROTOMY;  Surgeon: Coralie Keens, MD;  Location: [facility];  Service: General;  Laterality: N/A;  . LEFT HEART CATH AND CORS/GRAFTS ANGIOGRAPHY N/A 08/23/2017   Procedure: LEFT HEART CATH AND CORS/GRAFTS ANGIOGRAPHY;  Surgeon: Lorretta Harp, MD;  Location: [facility];  Service: Cardiovascular;  Laterality: N/A;  .  PERIPHERAL VASCULAR CATHETERIZATION N/A 01/01/2016   Procedure: Abdominal Aortogram w/Lower Extremity;  Surgeon: Conrad Colp, MD;  Location: [facility];  Service: Cardiovascular;  Laterality: N/A;  . PERIPHERAL VASCULAR CATHETERIZATION Left 01/01/2016   Procedure: Peripheral Vascular Balloon Angioplasty;  Surgeon: Conrad [place]ville, MD;  Location: [facility];  Service: Cardiovascular;  Laterality: Left;  Popliteal artery  . PERIPHERAL VASCULAR CATHETERIZATION N/A 12/04/2016   Procedure: Abdominal Aortogram w/Lower Extremity;  Surgeon: Waynetta Sandy, MD;  Location: [facility];  Service: Cardiovascular;  Laterality: N/A;  . PERIPHERAL VASCULAR CATHETERIZATION Left 12/04/2016   Procedure: Peripheral Vascular Balloon Angioplasty;  Surgeon: Waynetta Sandy, MD;  Location: [facility];  Service: Cardiovascular;  Laterality: Left;  Fem_Pop bypass  . PLACEMENT OF CENTRIMAG VENTRICULAR ASSIST DEVICE Right 08/28/2017   Procedure: PLACEMENT OF CENTRIMAG VENTRICULAR ASSIST DEVICE;  Surgeon: Ivin Poot, MD;  Location: [facility];  Service: Open Heart Surgery;  Laterality: Right;  . REMOVAL OF CENTRIMAG VENTRICULAR ASSIST DEVICE N/A 09/08/2017   Procedure: REMOVAL OF R-VENTRICULAR ASSIST DEVICE WITH CARDIOPULMONARY BYPASS;  Surgeon: Ivin Poot, MD;  Location: [facility];  Service: Open Heart Surgery;  Laterality: N/A;  . REMOVAL OF GASTROSTOMY TUBE N/A 10/07/2017   Procedure: REMOVAL AND REPLACEMENT OF GASTROSTOMY TUBE;  Surgeon: Coralie Keens, MD;  Location: [facility];  Service: General;  Laterality: N/A;  . TEE WITHOUT CARDIOVERSION N/A 08/28/2017   Procedure: TRANSESOPHAGEAL ECHOCARDIOGRAM (TEE);  Surgeon: Prescott Gum, Collier Salina, MD;  Location: [facility];  Service: Open Heart Surgery;  Laterality: N/A;  . TEE WITHOUT CARDIOVERSION N/A 09/08/2017   Procedure: TRANSESOPHAGEAL ECHOCARDIOGRAM (TEE);  Surgeon: Prescott Gum, Collier Salina, MD;  Location: [facility];  Service: Open Heart Surgery;  Laterality: N/A;    Family History  Problem Relation Age of Onset  . Arthritis Mother   . Hypertension Mother   . Heart disease Maternal Grandmother   . Mental illness Maternal Grandmother     Social History:  reports that he has been smoking cigarettes.  He has a 22.50 pack-year smoking history. he has never used  smokeless tobacco. He reports that he does not drink alcohol or use drugs.  Allergies:  Allergies  Allergen Reactions  . Tylenol [Acetaminophen] Other (See Comments)    unknown  . Aspirin Other (See Comments)    Stomach upset  . Latex Rash  . Oxycodone-Acetaminophen Itching    Medications; Prior to Admission medications   Medication Sig Start Date End Date Taking? Authorizing Provider  acetaminophen (TYLENOL) 650 MG CR tablet Take 650 mg by mouth every 4 (four) hours as needed for pain.   Yes [provider]  Amino Acids-Protein Hydrolys (FEEDING SUPPLEMENT, PRO-STAT SUGAR FREE 64,) LIQD Take 30 mLs by mouth 3 (three) times daily with meals.   Yes [provider]  atorvastatin (LIPITOR) 80 MG tablet Place 1 tablet (80 mg total) into feeding tube daily at 6 PM. 09/26/17  Yes Conte, Tessa N, PA-C  budesonide (PULMICORT) 0.25 MG/2ML nebulizer solution Take 2 mLs (0.25 mg total) by nebulization 2 (two) times daily. 09/26/17  Yes Conte, Tessa N, PA-C  collagenase (SANTYL) ointment Apply topically daily. 09/27/17  Yes Conte, Tessa N, PA-C  levothyroxine (SYNTHROID, LEVOTHROID) 25 MCG tablet Take 25 mcg by mouth daily before breakfast.   Yes [provider]  lidocaine (LIDODERM) 5 % Place 1 patch onto the skin daily. Remove & Discard patch within 12 hours or as directed by MD  Yes [provider]  metoCLOPramide (REGLAN) 5 MG tablet Take 5 mg by mouth 4 (four) times daily.   Yes [provider]  mirtazapine (REMERON) 15 MG tablet Take 15 mg by mouth at bedtime.   Yes [provider]  ondansetron (ZOFRAN-ODT) 4 MG disintegrating tablet Take 4 mg by mouth every 6 (six) hours as needed for nausea or vomiting.   Yes [provider]  pantoprazole sodium (PROTONIX) 40 mg/20 mL PACK Place 20 mLs (40 mg total) into feeding tube daily. 09/27/17  Yes Conte, Tessa N, PA-C  tamsulosin (FLOMAX) 0.4 MG CAPS capsule Take 0.4 mg by mouth daily.    Yes [provider]  traMADol (ULTRAM) 50 MG tablet Take 50 mg by mouth every 8 (eight) hours as needed for moderate pain.    Yes [provider]  clopidogrel (PLAVIX) 75 MG tablet Take 1 tablet by mouth daily. Patient not taking: Reported on 02/21/2018 01/12/18   Waynetta Sandy, MD  ondansetron [facility]) 4 MG/2ML SOLN injection Inject 2 mLs (4 mg total) into the vein every 6 (six) hours as needed for nausea or vomiting. Patient not taking: Reported on 02/21/2018 09/26/17   Elgie Collard, PA-C  warfarin (COUMADIN) 3 MG tablet Take 1 tablet (3 mg total) by mouth one time only at 6 PM. Patient not taking: Reported on 02/21/2018 09/26/17   Elgie Collard, PA-C   . budesonide  0.25 mg Nebulization BID  . folic acid  1 mg Intravenous Daily  . mirtazapine  15 mg Oral QHS  . pantoprazole (PROTONIX) IV  40 mg Intravenous BID  . thiamine  100 mg Intravenous Daily   PRN Meds traMADol Results for orders placed or performed during the hospital encounter of 02/21/18 (from the past 48 hour(s))  Comprehensive metabolic panel     Status: Abnormal   Collection Time: 02/21/18 11:46 AM  Result Value Ref Range   Sodium 123 (L) 135 - 145 mmol/L   Potassium 3.5 3.5 - 5.1 mmol/L   Chloride 90 (L) 101 - 111 mmol/L   CO2 22 22 - 32 mmol/L   Glucose, Bld 117 (H) 65 - 99 mg/dL   BUN 8 6 - 20 mg/dL   Creatinine, Ser 0.80 0.61 - 1.24 mg/dL   Calcium 7.9 (L) 8.9 - 10.3 mg/dL   Total Protein 6.8 6.5 - 8.1 g/dL   Albumin 1.7 (L) 3.5 - 5.0 g/dL   AST 25 15 - 41 U/L   ALT 11 (L) 17 - 63 U/L   Alkaline Phosphatase 125 38 - 126 U/L   Total Bilirubin 0.7 0.3 - 1.2 mg/dL   GFR calc non Af Amer >60 >60 mL/min   GFR calc Af Amer >60 >60 mL/min    Comment: (NOTE) The eGFR has been calculated using the CKD EPI equation. This calculation has not been validated in all clinical situations. eGFR's persistently <60 mL/min signify possible Chronic Kidney Disease.    Anion gap 11 5 - 15     Comment: Performed at [facility] [address] [place], [place] [address]  CBC with Differential/Platelet     Status: Abnormal   Collection Time: 02/21/18 11:46 AM  Result Value Ref Range   WBC 9.2 4.0 - 10.5 K/uL   RBC 3.80 (L) 4.22 - 5.81 MIL/uL   Hemoglobin 9.5 (L) 13.0 - 17.0 g/dL   HCT 30.1 (L) 39.0 - 52.0 %   MCV 79.2 78.0 - 100.0 fL   MCH  25.0 (L) 26.0 - 34.0 pg   MCHC 31.6 30.0 - 36.0 g/dL   RDW 16.0 (H) 11.5 - 15.5 %   Platelets 414 (H) 150 - 400 K/uL   Neutrophils Relative % 79 %   Neutro Abs 7.4 1.7 - 7.7 K/uL   Lymphocytes Relative 14 %   Lymphs Abs 1.3 0.7 - 4.0 K/uL   Monocytes Relative 5 %   Monocytes Absolute 0.4 0.1 - 1.0 K/uL   Eosinophils Relative 2 %   Eosinophils Absolute 0.1 0.0 - 0.7 K/uL   Basophils Relative 0 %   Basophils Absolute 0.0 0.0 - 0.1 K/uL    Comment: Performed at [facility] [address], [place], [place] [address]  Protime-INR     Status: Abnormal   Collection Time: 02/21/18 11:46 AM  Result Value Ref Range   Prothrombin Time 16.5 (H) 11.4 - 15.2 seconds   INR 1.35     Comment: Performed at [facility] [address], [place], [place] [address]  Type and screen [place]     Status: None   Collection Time: 02/21/18 11:49 AM  Result Value Ref Range   ABO/RH(D) O POS    Antibody Screen NEG    Sample Expiration      02/24/2018 Performed at [facility], [place] [address] [place], [place] [address]   I-stat troponin, ED     Status: None   Collection Time: 02/21/18 11:59 AM  Result Value Ref Range   Troponin i, poc 0.00 0.00 - 0.08 ng/mL   Comment 3            Comment: Due to the release kinetics of cTnI, a negative result within the first hours of the onset of symptoms does not rule out myocardial infarction with certainty. If myocardial infarction is still suspected, repeat the test at appropriate intervals.   I-Stat Chem 8, ED     Status: Abnormal   Collection Time: 02/21/18  12:00 PM  Result Value Ref Range   Sodium 126 (L) 135 - 145 mmol/L   Potassium 3.6 3.5 - 5.1 mmol/L   Chloride 89 (L) 101 - 111 mmol/L   BUN 8 6 - 20 mg/dL   Creatinine, Ser 0.70 0.61 - 1.24 mg/dL   Glucose, Bld 115 (H) 65 - 99 mg/dL   Calcium, Ion 1.11 (L) 1.15 - 1.40 mmol/L   TCO2 22 22 - 32 mmol/L   Hemoglobin 10.5 (L) 13.0 - 17.0 g/dL   HCT 31.0 (L) 39.0 - 52.0 %  I-Stat CG4 Lactic Acid, ED     Status: Abnormal   Collection Time: 02/21/18 12:01 PM  Result Value Ref Range   Lactic Acid, Venous 2.17 (HH) 0.5 - 1.9 mmol/L   Comment NOTIFIED PHYSICIAN   Osmolality     Status: Abnormal   Collection Time: 02/21/18  3:11 PM  Result Value Ref Range   Osmolality 268 (L) 275 - 295 mOsm/kg    Comment: Performed at [facility], [place] [address] [place], [place] [address]  Basic metabolic panel     Status: Abnormal   Collection Time: 02/21/18  3:11 PM  Result Value Ref Range   Sodium 126 (L) 135 - 145 mmol/L   Potassium 3.6 3.5 - 5.1 mmol/L   Chloride 95 (L) 101 - 111 mmol/L   CO2 21 (L) 22 - 32 mmol/L   Glucose, Bld 101 (H) 65 - 99 mg/dL   BUN 8  6 - 20 mg/dL   Creatinine, Ser 0.67 0.61 - 1.24 mg/dL   Calcium 7.6 (L) 8.9 - 10.3 mg/dL   GFR calc non Af Amer >60 >60 mL/min   GFR calc Af Amer >60 >60 mL/min    Comment: (NOTE) The eGFR has been calculated using the CKD EPI equation. This calculation has not been validated in all clinical situations. eGFR's persistently <60 mL/min signify possible Chronic Kidney Disease.    Anion gap 10 5 - 15    Comment: Performed at [facility] [address] [place], [place] [address]  Lactic acid, plasma     Status: None   Collection Time: 02/21/18  4:40 PM  Result Value Ref Range   Lactic Acid, Venous 1.2 0.5 - 1.9 mmol/L    Comment: Performed at [facility] [address], [place], [place] [address]  TSH     Status: None   Collection Time: 02/21/18  5:05 PM  Result Value Ref Range   TSH 4.500 0.350 - 4.500 uIU/mL     Comment: Performed by a 3rd Generation assay with a functional sensitivity of <=0.01 uIU/mL. Performed at [facility], [place] [address], [place], [place] [address]   Culture, blood (routine x 2)     Status: None (Preliminary result)   Collection Time: 02/21/18  5:28 PM  Result Value Ref Range   Specimen Description BLOOD RIGHT ANTECUBITAL    Special Requests      BOTTLES DRAWN AEROBIC AND ANAEROBIC Blood Culture results may not be optimal due to an excessive volume of blood received in culture bottles   Culture      NO GROWTH < 24 HOURS Performed at [facility] [address], [place], [place] [address]    Report Status PENDING   Urinalysis, Routine w reflex microscopic     Status: Abnormal   Collection Time: 02/21/18  6:28 PM  Result Value Ref Range   Color, Urine YELLOW YELLOW   APPearance HAZY (A) CLEAR   Specific Gravity, Urine 1.014 1.005 - 1.030   pH 7.0 5.0 - 8.0   Glucose, UA NEGATIVE NEGATIVE mg/dL   Hgb urine dipstick SMALL (A) NEGATIVE   Bilirubin Urine NEGATIVE NEGATIVE   Ketones, ur NEGATIVE NEGATIVE mg/dL   Protein, ur NEGATIVE NEGATIVE mg/dL   Nitrite NEGATIVE NEGATIVE   Leukocytes, UA LARGE (A) NEGATIVE   RBC / HPF 0-5 0 - 5 RBC/hpf   WBC, UA TOO NUMEROUS TO COUNT 0 - 5 WBC/hpf   Bacteria, UA FEW (A) NONE SEEN   Squamous Epithelial / LPF NONE SEEN NONE SEEN    Comment: Performed at [facility], [place] [address], [place], [place] [address]  Na and K (sodium & potassium), rand urine     Status: None   Collection Time: 02/21/18  6:29 PM  Result Value Ref Range   Sodium, Ur <10 mmol/L   Potassium Urine 10 mmol/L    Comment: Performed at [facility], [place] [address], [place], [place] [address]  Osmolality, urine     Status: Abnormal   Collection Time: 02/21/18  6:29 PM  Result Value Ref Range   Osmolality, Ur 132 (L) 300 - 900 mOsm/kg    Comment: Performed at [facility] [address], [place],  [address]  Culture, blood  (routine x 2)     Status: None (Preliminary result)   Collection Time: 02/21/18  6:30 PM  Result  Value Ref Range   Specimen Description BLOOD RIGHT HAND    Special Requests IN PEDIATRIC BOTTLE Blood Culture adequate volume    Culture      NO GROWTH < 24 HOURS Performed at [facility] [address], [place], [place] [address]    Report Status PENDING   Troponin I (q 6hr x 3)     Status: Abnormal   Collection Time: 02/21/18  8:27 PM  Result Value Ref Range   Troponin I 0.03 (HH) <0.03 ng/mL    Comment: CRITICAL RESULT CALLED TO, READ BACK BY AND VERIFIED WITH: Judy Pimple [facility] 02/21/18 [id number] Performed at [facility], [place] [address], [place], [place] [address]   Troponin I (q 6hr x 3)     Status: None   Collection Time: 02/22/18 12:12 AM  Result Value Ref Range   Troponin I <0.03 <0.03 ng/mL    Comment: Performed at [facility] [address], [place], [place] [address]  Troponin I     Status: None   Collection Time: 02/22/18  2:50 AM  Result Value Ref Range   Troponin I <0.03 <0.03 ng/mL    Comment: Performed at [facility] [address] [place], [place] [address]  Basic metabolic panel     Status: Abnormal   Collection Time: 02/22/18  7:51 AM  Result Value Ref Range   Sodium 129 (L) 135 - 145 mmol/L   Potassium 3.2 (L) 3.5 - 5.1 mmol/L   Chloride 101 101 - 111 mmol/L   CO2 20 (L) 22 - 32 mmol/L   Glucose, Bld 94 65 - 99 mg/dL   BUN 6 6 - 20 mg/dL   Creatinine, Ser 0.60 (L) 0.61 - 1.24 mg/dL   Calcium 7.5 (L) 8.9 - 10.3 mg/dL   GFR calc non Af Amer >60 >60 mL/min   GFR calc Af Amer >60 >60 mL/min    Comment: (NOTE) The eGFR has been calculated using the CKD EPI equation. This calculation has not been validated in all clinical situations. eGFR's persistently <60 mL/min signify possible Chronic Kidney Disease.    Anion gap 8 5 - 15    Comment: Performed at [facility] [address], [place], [place] [address]  CBC     Status:  Abnormal   Collection Time: 02/22/18  7:51 AM  Result Value Ref Range   WBC 7.6 4.0 - 10.5 K/uL   RBC 3.04 (L) 4.22 - 5.81 MIL/uL   Hemoglobin 7.9 (L) 13.0 - 17.0 g/dL    Comment: DELTA CHECK NOTED REPEATED TO VERIFY    HCT 24.0 (L) 39.0 - 52.0 %   MCV 78.9 78.0 - 100.0 fL   MCH 26.0 26.0 - 34.0 pg   MCHC 32.9 30.0 - 36.0 g/dL   RDW 16.3 (H) 11.5 - 15.5 %   Platelets 375 150 - 400 K/uL    Comment: Performed at [facility], [place] [address] [place], [place] [address]  Protime-INR     Status: Abnormal   Collection Time: 02/22/18  7:51 AM  Result Value Ref Range   Prothrombin Time 18.3 (H) 11.4 - 15.2 seconds   INR 1.54     Comment: Performed at [facility] [address], [place], [place] [address]  APTT     Status: Abnormal   Collection Time: 02/22/18  7:51 AM  Result Value Ref Range   aPTT 45 (H) 24 - 36 seconds  Comment:        IF BASELINE aPTT IS ELEVATED, SUGGEST PATIENT RISK ASSESSMENT BE USED TO DETERMINE APPROPRIATE ANTICOAGULANT THERAPY. Performed at [facility], [place] [address], [place], [place] [address]     Ct Abdomen Pelvis W Contrast  Result Date: 02/21/2018 CLINICAL DATA:  Mid abdominal pain.  Nausea and vomiting. EXAM: CT ABDOMEN AND PELVIS WITH CONTRAST TECHNIQUE: Multidetector CT imaging of the abdomen and pelvis was performed using the standard protocol following bolus administration of intravenous contrast. CONTRAST:  <See Chart> ISOVUE-300 IOPAMIDOL (ISOVUE-300) INJECTION 61% COMPARISON:  CT abdomen pelvis 10/07/2017. FINDINGS: Lower chest: Normal heart size. Trace pericardial fluid. Dependent atelectasis within the bilateral lower lobes. Hepatobiliary: Liver is normal in size and contour. No intrahepatic or extrahepatic biliary ductal dilatation. No inflammatory changes about the gallbladder. Mild nonspecific wall thickening of the gallbladder fundus. Pancreas: Unremarkable Spleen: Unremarkable Adrenals/Urinary Tract: Adrenal glands are  normal. Kidneys enhance symmetrically with contrast. Stable calcification within the midpole of the left kidney. No hydronephrosis. Mild wall thickening of the posterior aspect of the urinary bladder. Re-demonstrated high attenuation along the posterior aspect of the urinary bladder wall (image 75; series 3). Stomach/Bowel: No abnormal bowel wall thickening or evidence for bowel obstruction. No free fluid or free intraperitoneal air. Small hiatal hernia. Normal morphology of the stomach. Percutaneous gastrostomy tube terminates in the gastric antrum/proximal duodenum. No free fluid or free intraperitoneal air. Vascular/Lymphatic: Normal caliber abdominal aorta. Peripheral calcified atherosclerotic plaque. No retroperitoneal lymphadenopathy. Reproductive: Prostate is enlarged. Other: Bilateral fat containing inguinal hernias. Musculoskeletal: Lumbar spine degenerative changes. There is a decubitus ulcer overlying the mid and distal sacrum which extends to the sacral bone where there is suggestion of cortical destruction. IMPRESSION: 1. Large sacral decubitus ulcer extending to the underlying sacrum with associated osseous destruction concerning for associated osteomyelitis. 2. No acute process within the abdomen or pelvis. 3. Percutaneous gastrostomy tube balloon terminates in the gastric antrum/proximal duodenum. 4. Mild nonspecific wall thickening of the gallbladder fundus. Consider further evaluation with right upper quadrant ultrasound. 5. Nonspecific high attenuation along the posterior urinary bladder wall. Consider further evaluation with direct visualization as clinically indicated. Electronically Signed   By: Lovey Newcomer M.D.   On: 02/21/2018 12:45   US Abdomen Limited Ruq  Result Date: 02/21/2018 CLINICAL DATA:  Abdominal pain EXAM: ULTRASOUND ABDOMEN LIMITED RIGHT UPPER QUADRANT COMPARISON:  CT abdomen and pelvis February 21, 2018 FINDINGS: Gallbladder: No gallstones or wall thickening visualized. There  is no pericholecystic fluid. No sonographic Murphy sign noted by sonographer. Common bile duct: Diameter: 4 mm. There is no intrahepatic, common hepatic, or common bile duct dilatation. Liver: No focal lesion identified. Within normal limits in parenchymal echogenicity. Portal vein is patent on color Doppler imaging with normal direction of blood flow towards the liver. IMPRESSION: Study within normal limits. Electronically Signed   By: Lowella Grip III M.D.   On: 02/21/2018 21:37   ROS: Denies preceding nausea or abdominal pain.  Review of systems otherwise quite extensive as noted in H&P            Blood pressure 99/68, pulse (!) 102, temperature 98.1 F (36.7 C), temperature source Oral, resp. rate (!) 25, height _0  (1.727 m), weight 67.6 kg (149 lb), SpO2 95 %.  Physical exam:   General--frail-appearing very pale white male ENT--nonicteric Neck--supple with no gross masses or lymphadenopathy Heart--large CABG scar regular rate and rhythm without murmurs or gallops Lungs--clear Abdomen--PEG tube in place site looks good  no bleeding around site good bowel sounds and nontender Psych--alert and oriented appears to answer questions appropriately   Assessment: 1.  Hematemesis and acute drop in hemoglobin.  Etiology unclear.  I think he really should have an EGD.  Have discussed this with him in detail he is agreeable.  He has had a significant drop in hemoglobin.  Anticoagulated due to A. fib 2.  Multiple decubiti on sacrum and feet with probable sacral osteomyelitis 3.  Lactic acidosis probably due to the osteomyelitis.  Patient is currently on vancomycin and Zosyn 4.  Hyponatremia etiology unclear workup underway 5.  History of atrial fibrillation in conjunction with CABG.  Has been NSR for several months.  Has been anticoagulated but in view of GI bleed this is on hold. 6.  Malnutrition.  Patient able to eat some but is required PEG tube to maintain nutrition 7.   Diabetes 8.  CAD status post CABG with temporary placement of right ventricular assist devise subsequent with history of respiratory failure and congestive heart failure  Plan: 1.  Continue to hold Coumadin.  INR currently 1.5. 2.  We will proceed with EGD to evaluate his hematemesis in the morning.  Have discussed this in detail with the patient.  Hopefully we will be able to reinitiate tube feedings   Nancy Fetter 02/22/2018, 2:33 PM   This note was created using voice recognition software and minor errors may Have occurred unintentionally. Pager: [phone] If no answer or after hours call [phone]

## 2018-02-22 NOTE — Progress Notes (Signed)
PT Cancellation Note  Patient Details Name: Rhae HammockReginald E Kudo MRN: [id number] DOB: 1951-07-14   Cancelled Treatment:    Reason Eval/Treat Not Completed: Other (comment). Per RN, MD team is consulting palliative care. Will await medical plan and goals of care prior to completing PT eval. Acute PT to return as able, as necessary.  Lewis ShockAshly Perina Salvaggio, PT, DPT Pager #: [phone] Office #: [phone]    Iona Hansenshly M Calem Cocozza 02/22/2018, 12:34 PM

## 2018-02-23 ENCOUNTER — Encounter (HOSPITAL_COMMUNITY)
Admission: EM | Disposition: A | Discharge status: Hospice | Payer: Self-pay | Source: Home / Self Care | Attending: Internal Medicine

## 2018-02-23 ENCOUNTER — Encounter (HOSPITAL_COMMUNITY): Payer: Self-pay

## 2018-02-23 ENCOUNTER — Inpatient Hospital Stay (HOSPITAL_COMMUNITY): Payer: PPO | Admitting: Anesthesiology

## 2018-02-23 DIAGNOSIS — L89159 Pressure ulcer of sacral region, unspecified stage: Secondary | ICD-10-CM | POA: Diagnosis present

## 2018-02-23 DIAGNOSIS — D649 Anemia, unspecified: Secondary | ICD-10-CM | POA: Diagnosis present

## 2018-02-23 DIAGNOSIS — L89610 Pressure ulcer of right heel, unstageable: Secondary | ICD-10-CM

## 2018-02-23 DIAGNOSIS — Z8679 Personal history of other diseases of the circulatory system: Secondary | ICD-10-CM

## 2018-02-23 DIAGNOSIS — Z931 Gastrostomy status: Secondary | ICD-10-CM

## 2018-02-23 DIAGNOSIS — L89620 Pressure ulcer of left heel, unstageable: Secondary | ICD-10-CM

## 2018-02-23 DIAGNOSIS — Z515 Encounter for palliative care: Secondary | ICD-10-CM

## 2018-02-23 DIAGNOSIS — E871 Hypo-osmolality and hyponatremia: Secondary | ICD-10-CM | POA: Diagnosis present

## 2018-02-23 DIAGNOSIS — K9429 Other complications of gastrostomy: Secondary | ICD-10-CM

## 2018-02-23 HISTORY — PX: ESOPHAGOGASTRODUODENOSCOPY (EGD) WITH PROPOFOL: SHX5813

## 2018-02-23 LAB — BASIC METABOLIC PANEL
Anion gap: 7 (ref 5–15)
BUN: 5 mg/dL — AB (ref 6–20)
CHLORIDE: 102 mmol/L (ref 101–111)
CO2: 21 mmol/L — AB (ref 22–32)
CREATININE: 0.72 mg/dL (ref 0.61–1.24)
Calcium: 7.4 mg/dL — ABNORMAL LOW (ref 8.9–10.3)
GFR calc Af Amer: >60 mL/min (ref >60)
GFR calc non Af Amer: >60 mL/min (ref >60)
GLUCOSE: 95 mg/dL (ref 65–99)
POTASSIUM: 3.5 mmol/L (ref 3.5–5.1)
Sodium: 130 mmol/L — ABNORMAL LOW (ref 135–145)

## 2018-02-23 LAB — CBC
HEMATOCRIT: 23.6 % — AB (ref 39.0–52.0)
Hemoglobin: 7.8 g/dL — ABNORMAL LOW (ref 13.0–17.0)
MCH: 26.4 pg (ref 26.0–34.0)
MCHC: 33.1 g/dL (ref 30.0–36.0)
MCV: 79.7 fL (ref 78.0–100.0)
PLATELETS: 368 10*3/uL (ref 150–400)
RBC: 2.96 MIL/uL — ABNORMAL LOW (ref 4.22–5.81)
RDW: 16.1 % — AB (ref 11.5–15.5)
WBC: 8.6 10*3/uL (ref 4.0–10.5)

## 2018-02-23 LAB — MAGNESIUM: Magnesium: 1.4 mg/dL — ABNORMAL LOW (ref 1.7–2.4)

## 2018-02-23 LAB — VANCOMYCIN, TROUGH: Vancomycin Tr: 33 ug/mL (ref 15–20)

## 2018-02-23 SURGERY — ESOPHAGOGASTRODUODENOSCOPY (EGD) WITH PROPOFOL
Anesthesia: Monitor Anesthesia Care

## 2018-02-23 MED ORDER — PHENYLEPHRINE HCL 10 MG/ML IJ SOLN
INTRAMUSCULAR | Status: DC | PRN
  Administered 2018-02-23: 80 ug via INTRAVENOUS
  Administered 2018-02-23: 40 ug via INTRAVENOUS

## 2018-02-23 MED ORDER — COLLAGENASE 250 UNIT/GM EX OINT
TOPICAL_OINTMENT | Freq: Every day | CUTANEOUS | Status: DC
  Administered 2018-02-25 – 2018-02-27 (×3): via TOPICAL
  Administered 2018-02-28: 1 via TOPICAL
  Filled 2018-02-23: qty 30

## 2018-02-23 MED ORDER — MAGNESIUM SULFATE 2 GM/50ML IV SOLN
2.0000 g | Freq: Once | INTRAVENOUS | Status: AC
  Administered 2018-02-23: 2 g via INTRAVENOUS
  Filled 2018-02-23: qty 50

## 2018-02-23 MED ORDER — PROPOFOL 500 MG/50ML IV EMUL: INTRAVENOUS | Status: DC | PRN

## 2018-02-23 MED ORDER — PHENYLEPHRINE HCL 10 MG/ML IJ SOLN
INTRAVENOUS | Status: DC | PRN
  Administered 2018-02-23: 40 ug/min via INTRAVENOUS

## 2018-02-23 MED ORDER — SODIUM CHLORIDE 0.9 % IV SOLN
INTRAVENOUS | Status: DC
  Administered 2018-02-23: 21:00:00 via INTRAVENOUS

## 2018-02-23 MED ORDER — PROPOFOL 500 MG/50ML IV EMUL
INTRAVENOUS | Status: DC | PRN
  Administered 2018-02-23: 50 ug/kg/min via INTRAVENOUS

## 2018-02-23 MED ORDER — VANCOMYCIN HCL IN DEXTROSE 750-5 MG/150ML-% IV SOLN
750.0000 mg | Freq: Two times a day (BID) | INTRAVENOUS | Status: DC
  Administered 2018-02-24 – 2018-02-25 (×3): 750 mg via INTRAVENOUS
  Filled 2018-02-23 (×4): qty 150

## 2018-02-23 SURGICAL SUPPLY — 15 items

## 2018-02-23 NOTE — Progress Notes (Signed)
OT Cancellation Note  Patient Details Name: Clinton HammockReginald E Harrington MRN: [id number] DOB: [birth year]   Cancelled Treatment:    Reason Eval/Treat Not Completed: Patient at procedure or test/ unavailable(Pt in endo, will follow.)  Evern BioMayberry, Ronnett Pullin Lynn 02/23/2018, 11:01 AM

## 2018-02-23 NOTE — Progress Notes (Signed)
Subjective:  Clinton Harrington was resting comfortably in bed. He endorsed mild lightheadedness. Denies chest pain or shortness of breath. No recurrent episodes of hematemesis. Denies abdominal pain or blood in stools. Patient endorses feeling hungry this morning.  Objective:  Vital signs in last 24 hours: Vitals:   02/23/18 0407 02/23/18 0456 02/23/18 0806 02/23/18 1041  BP: 97/67 92/72 94/70  (!) 87/57  Pulse: (!) 104 (!) 108 (!) 102 (!) 102  Resp: (!) 27 (!) 27 (!) 25 20  Temp:  98.1 F (36.7 C) 98.6 F (37 C) 98.4 F (36.9 C)  TempSrc:  Oral Oral Oral  SpO2: 98% 98% 99% 96%  Weight:    149 lb (67.6 kg)  Height:    5\' 8"  (1.727 m)   GEN: Chronically ill, thin male lying in bed. Alert and oriented. No acute distress. Edentulous RESP: Clear to auscultation bilaterally. No increased work of breathing, on RA. CV: Slightly tachycardic and regular rhythm. No murmurs, gallops, or rubs. No LE edema. Well-healed midline sternotomy scar with 1x1cm round pink wound at the bottom end of the scar. ABD: Soft. Mild suprapubic tenderness. Non-distended. Normoactive bowel sounds. G-tube site without evidence of erythema or tenderness. EXT: No edema. Warm. 2+ DP pulses. Bilateral feet in space boots. Bilateral heel pressure injuries, R worse than L SACRUM: ~5x7cm unstageable sacral pressure injury with feculent matter, overlying dressing. NEURO: Cranial nerves II-XII grossly intact. Able to lift all four extremities against gravity. No apparent audiovisual hallucinations. Speech fluent and appropriate. PSYCH: Patient is calm and pleasant. Appropriate affect. Well-groomed; speech is appropriate and on-subject.  Assessment/Plan:  Active Problems:   Hematemesis  Mr. Groesbeck is a 67yo male with PMH of afib, CAD s/p CABG x4 in 08/2017, diabetes, HTN, PVD s/p L fem-pop bypass, HLD, COPD, and recent ex-lap in 09/2017 for pneumoperitoneum from leaking G-tube, and PEG tube who presents from SNF with an episode  of coffee ground emesis, no further recurrence, although Hb has dropped to 7.8 from baseline ~8.5-10. CT A/P on admission does not show an etiology for his hematemesis, but did note concern for sacral osteomyelitis. He was started on Vanc/zosyn in the ED. Afebrile and HDS. Hyponatremia improving with fluids. GI consulted for EGD today.  Hematemesis Hb 9.5 -> 7.8 this AM, baseline ~8.7-10.4. CT A/P without etiology for hematemesis. No recurrent episodes while inpatient. Denies hematochezia or melena. GI consulted; EGD showed no source of bleeding, but did show that PEG tube was dislodged. - GI consulted; appreciate their assistance - IR consulted for assistance with replacement/repositioning of PEG tube - Continue telemetry - Continue IV protonix 40mg  BID - CBC in AM - NPO, will start diet after PEG tube replaced - D5W at 75cc/hr while NPO  Sacral osteomyelitis Calcaneal osteomyelitis, likely bilateral Lactic acidosis, resolved Confirmed on CT A/P. Bilateral foot x-rays suggest likely osteomyelitis in bilateral heels. Lactic acid now normal, s/p 1.5L NS bolus. Normal WBC. Patient is hemodynamically stable and afebrile, however antibiotics were already started in the ED. Will continue for now and attempt to obtain cultures (although obtained after antibiotics had been initiated). - Wound care consulted; appreciate their assistance - Hydrotherapy, per wound care - Continue vancomycin and zosyn, per pharm (3/16 -> ) - F/u BCx 3/16 -> NG x1d - Further management, pending palliative care discussion  Hypotonic hyponatremia, improved with IVF Na 130 this AM. Likely secondary to hypovolemia and low dietary intake. - BMP in AM - Continue IVF, as above  Hypokalemia, hypomagnesemia K 3.5 this  AM, Mg 1.4 - D5W 75cc/hr + K 8220mEq/L - IV Mg sulfate 2g x1 - BMP in AM  Hx of atrial fibrillation/flutter In setting of CABG and acute illness. S/p DCCV 08/2017. Was on amiodarone, which has since been  discontinued. Also was on home warfarin, however most recent note from [facility] in 12/2017 notes he has remained in NSR for 3 months post-op and may no longer require warfarin. Currently in NSR, HR in 90s-100s. - Telemetry - Hold home warfarin in setting of hematemesis  CAD s/p CABG x4 in 08/2017 New RBBB Troponins peaked at 0.03. EKG without ischemic changes. Denies current chest pain.  - Hold home plavix in setting of hematemesis  Hx of HTN BP continues to be soft 87/57, this AM, responsive to IVF. Not on any home anti-hypertensives. Per chart review, patient has soft-normal BP since recent prolonged hospitalizations. - Continue to monitor - IVF, as above  Diabetes Not on any home medications. Currently on gabapentin 900mg  BID and tramadol 50mg  TID PRN for neuropathy. Can consider cymbalta, aspercream, or lidocaine ointment if still no relief. BG 94-101. - CBG monitoring - Continue home tramadol 50mg  q8h PRN  COPD Home regimen includes budesonide nebulizer. Stable, no wheezing on exam. - Continue home budenoside nebulizer PRN for SOB or wheezing  Severe deconditioning, malnourishment Presenting from SNF, reports that [facility] has been [facility] Has had multiple hospitalizations recently. - PT/OT eval - IV thiamine, folate  Goals of Care Currently full code. Has had recent prolonged hospitalization, sacral osteomyelitis, PEG tube with chronic debility.  - Palliative Care consulted; appreciate their assistance  Dispo: Anticipated discharge in approximately 2-3 day(s).   Scherrie GerlachHuang, Marcena Dias, MD 02/23/2018, 11:33 AM Pager: Demetrius CharityP [phone]

## 2018-02-23 NOTE — Progress Notes (Signed)
Internal Medicine Attending:   I saw and examined the patient. I reviewed the resident's note and I agree with the resident's findings and plan as documented in the resident's note. As noted G tube was dislodged no active bleeding on EGD, IR has adjusted tube.  No further hematemesis.  His normocytic anemia is likely multifactorial from anemia of chronic disease and a component of GI blood loss.  His sacral and bilateral heel ulcers are unstageable (likely truly stage 4 given xray and CT findings).  He is on Vanc/Zosyn for broad spectrum coverage, he is at high risk for MDR orgainisms as [facility].  Overall his prognosis is very poor, agree with Palliative care consult to assist in establishing goals of care.  If aggressive management is considered we will need a ID consult.

## 2018-02-23 NOTE — Progress Notes (Signed)
IR asked to eval G-tube. Originally placed in October 2018. Pt presented with N/V, abd pain. Taken for upper endo today. G-tube balloon noted to be positioned at the duodenal bulb causing obstructive symptoms.  Imaging reviewed including CT from 3/16. Tube evaluated at bedside. Tubing and retention balloon have migrated distally to duodenal bulb as noted. Tube pulled back and resecured at the 2 cm mark. External bumper secured at that position and RN given instructions to make sure bumper stays at this location. Tube flushed easily with 10 mL flush.  Tube ok for use.  Brayton ElKevin Daquana Paddock PA-C Interventional Radiology 02/23/2018 1:47 PM

## 2018-02-23 NOTE — Consult Note (Signed)
WOC Nurse wound consult note Reason for Consult: multiple PI from SNF Wound type: Unstageable Pressure Injury: sacrum Unstageable Pressure Injury: right heel Unstageable Pressure Injury: left heel  Surgical wound: lower sternum  Pressure Injury POA: Yes Measurement: Sacrum:9cm x 8cm x 1.5cm with undermining from 6-12 o'clock that at deepest point is 3.0cm  Right heel: 3cm x 3cm x 0.1cm  Left heel: 6cm x 4cm x 0.1cm  Sternum: 0.5cm x 0.5cm x 0.1cm  Wound bed: Sacrum: 95% yellow/grey non viable tissue; 5% pink with some bleeding Right heel: 80% yellow non viable tissue; 20% pink Left heel: 80% yellow non viable tissue; 20% pink Sternum: 100% hypergranular tissue Drainage (amount, consistency, odor)  Heavy malodorous from the sacrum, non purulent.  Moderate non purulent, no odor from heel wounds Minimal from the sternal wound Periwound: intact  Air mattress in place for moisture management and pressure redistribution Prevalon boots in place for offloading heel pressure injuries Enzymatic debridement ointment to the sacrum and bilateral heel pressure injuries with hydrotherapy and CSWD per PT Monday-Saturday. RD for supplements for wound healing Silicone foam to the sternal wound, applied directly to wound. DC saline gauze-wound is hypergranulated does not need extra moisture at this time.  WOC Nurse team will follow along with you for weekly wound assessments.  Please notify me of any acute changes in the wounds or any new areas of concerns Chinenye Katzenberger [facility] MSN, RN,CWOCN, CNS, CWON-AP [phone]

## 2018-02-23 NOTE — Consult Note (Signed)
Consultation Note Date: 02/23/2018   Patient Name: Clinton HammockReginald E Harrington  DOB: 1951/05/20  MRN: [id number]  Age / Sex: 67 y.o., male  PCP: Eloisa NorthernAmin, Saad, MD Referring Physician: Earl LagosNarendra, Nischal, MD  Reason for Consultation: Establishing goals of care and Psychosocial/spiritual support  HPI/Patient Profile: 67 y.o. male  admitted on 02/21/2018 with  PMH of afib, CAD s/p CABG x4 in 08/2017, diabetes, HTN, PVD s/p L fem-pop bypass, HLD, COPD, and recent ex-lap in 09/2017 for pneumoperitoneum from leaking G-tube, and PEG tube [facility] with an episode of coffee ground emesis.  He had a prolonged hospitalization 9/15-10/19 when he presented with chest pain and dyspnea, and underwent CABG x4 and placement of RVAD x1 week on 08/28/2017. He was intubated and required pressors. Amiodarone was added for atrial fibrillation and DCCV was done on 9/20 for atrial flutter with hypotension. He did require pRBC transfusions for ABLA. Tracheostomy was placed 10/10 and he was discharged to LTAC on anidulafungin, meropenem, and vancomycin for suspected pneumonia and possible infection from multiple pressure injuries. Gastrotomy tube placed by IR on 10/24. He was re-admitted 10/30-11/1 for acute abdominal pain. He underwent ex-lap on 10/30 and required removal and replacement of G-tube due to leaking. [facility]  Since discharge, he reports that he had been working on his strength at the LTAC. He endorses 3 episodes of brown-colored emesis today, as well as mild abdominal pain for the last 2 weeks. He denies hematochezia or melena. He does endorse feeling lightheaded as well as palpitations. Endorses dysuria, subjective chills, sacral pain, and decreased appetite. Denies current CP, SOB, night sweats, or leg swelling.  Patient faces ongoing decisions related to  treatment options, advance directive, and anticipatory care needs.   Clinical Assessment and Goals of Care:  This NP Lorinda CreedMary Larach reviewed medical records, received report from team, assessed the patient and then meet at the patient's bedside   to discuss diagnosis, prognosis, GOC, EOL wishes disposition and options.  Concept of  Palliative Care was discussed  A  discussion was had today regarding advanced directives.  Concepts specific to code status, artifical feeding and hydration, continued IV antibiotics and rehospitalization was had.  The difference between a aggressive medical intervention path  and a palliative comfort care path for this patient at this time was had.  Values and goals of care important to patient and family were attempted to be elicited.  Questions and concerns addressed.  PMT will continue to support holistically.  No documented healthcare power of attorney.  Patient has very little social support.  I have placed a call to Ray Johnson/freind and main support person, await callback  Patient has very little insight into the complexity of his current medical situation and overall long-term poor prognosis,  my hope is that I can get in touch with family/support and have ongoing discussion regarding goals of care.  SUMMARY OF RECOMMENDATIONS    Code Status/Advance Care Planning:  Full code-encouraged patient to  consider DNR/DNI status knowing poor outcomes in similar patients.   Palliative Prophylaxis:   Aspiration, Bowel Regimen, Delirium Protocol, Frequent Pain Assessment and Oral Care  Additional Recommendations (Limitations, Scope, Preferences):  Full Scope Treatment  Psycho-social/Spiritual:   Desire for further Chaplaincy support:no  Prognosis:   Unable to determine  Discharge Planning: To Be Determined      Primary Diagnoses: Present on Admission: . Hematemesis   I have reviewed the medical record, interviewed the patient and family, and  examined the patient. The following aspects are pertinent.  Past Medical History:  Diagnosis Date  . Arthritis   . Diabetes (HCC)   . Diabetes mellitus Jan. 1999  . High cholesterol   . Hypertension   . Myocardial infarct (HCC)   . Peripheral vascular disease (HCC)   . Seasonal allergies   . Smoker   . Urine incontinence    Social History   Socioeconomic History  . Marital status: Single    Spouse name: None  . Number of children: None  . Years of education: None  . Highest education level: None  Social Needs  . Financial resource strain: None  . Food insecurity - worry: None  . Food insecurity - inability: None  . Transportation needs - medical: None  . Transportation needs - non-medical: None  Occupational History  . None  Tobacco Use  . Smoking status: Current Every Day Smoker    Packs/day: 0.50    Years: 45.00    Pack years: 22.50    Types: Cigarettes  . Smokeless tobacco: Never Used  . Tobacco comment: 1/2 pk per day  Substance and Sexual Activity  . Alcohol use: No    Alcohol/week: 0.0 oz  . Drug use: No  . Sexual activity: None  Other Topics Concern  . None  Social History Narrative   Disability since 2015.  Previously worked at [employer] for 10 years.    He lives at home with mother with dementia.   He has one daughter.    Family History  Problem Relation Age of Onset  . Arthritis Mother   . Hypertension Mother   . Heart disease Maternal Grandmother   . Mental illness Maternal Grandmother    Scheduled Meds: . budesonide  0.25 mg Nebulization BID  . folic acid  1 mg Intravenous Daily  . mirtazapine  15 mg Oral QHS  . pantoprazole (PROTONIX) IV  40 mg Intravenous BID  . thiamine  100 mg Intravenous Daily   Continuous Infusions: . dextrose 5 % and 0.9 % NaCl with KCl 20 mEq/L 75 mL/hr at 02/23/18 0915  . magnesium sulfate 1 - 4 g bolus IVPB 2 g (02/23/18 0858)  . piperacillin-tazobactam (ZOSYN)  IV 3.375 g (02/23/18 0749)  . vancomycin  Stopped (02/23/18 0849)   PRN Meds:.traMADol Medications Prior to Admission:  Prior to Admission medications   Medication Sig Start Date End Date Taking? Authorizing Provider  acetaminophen (TYLENOL) 650 MG CR tablet Take 650 mg by mouth every 4 (four) hours as needed for pain.   Yes [provider]  Amino Acids-Protein Hydrolys (FEEDING SUPPLEMENT, PRO-STAT SUGAR FREE 64,) LIQD Take 30 mLs by mouth 3 (three) times daily with meals.   Yes [provider]  atorvastatin (LIPITOR) 80 MG tablet Place 1 tablet (80 mg total) into feeding tube daily at 6 PM. 09/26/17  Yes Conte, Tessa N, PA-C  budesonide (PULMICORT) 0.25 MG/2ML nebulizer solution Take 2 mLs (0.25 mg total) by nebulization 2 (two) times  daily. 09/26/17  Yes Conte, Tessa N, PA-C  collagenase (SANTYL) ointment Apply topically daily. 09/27/17  Yes Conte, Tessa N, PA-C  levothyroxine (SYNTHROID, LEVOTHROID) 25 MCG tablet Take 25 mcg by mouth daily before breakfast.   Yes [provider]  lidocaine (LIDODERM) 5 % Place 1 patch onto the skin daily. Remove & Discard patch within 12 hours or as directed by MD   Yes [provider]  metoCLOPramide (REGLAN) 5 MG tablet Take 5 mg by mouth 4 (four) times daily.   Yes [provider]  mirtazapine (REMERON) 15 MG tablet Take 15 mg by mouth at bedtime.   Yes [provider]  ondansetron (ZOFRAN-ODT) 4 MG disintegrating tablet Take 4 mg by mouth every 6 (six) hours as needed for nausea or vomiting.   Yes [provider]  pantoprazole sodium (PROTONIX) 40 mg/20 mL PACK Place 20 mLs (40 mg total) into feeding tube daily. 09/27/17  Yes Conte, Tessa N, PA-C  tamsulosin (FLOMAX) 0.4 MG CAPS capsule Take 0.4 mg by mouth daily.   Yes [provider]  traMADol (ULTRAM) 50 MG tablet Take 50 mg by mouth every 8 (eight) hours as needed for moderate pain.    Yes [provider]  clopidogrel (PLAVIX) 75 MG tablet Take 1 tablet by mouth  daily. Patient not taking: Reported on 02/21/2018 01/12/18   Maeola Harman, MD  ondansetron [facility]) 4 MG/2ML SOLN injection Inject 2 mLs (4 mg total) into the vein every 6 (six) hours as needed for nausea or vomiting. Patient not taking: Reported on 02/21/2018 09/26/17   Sharlene Dory, PA-C  warfarin (COUMADIN) 3 MG tablet Take 1 tablet (3 mg total) by mouth one time only at 6 PM. Patient not taking: Reported on 02/21/2018 09/26/17   Sharlene Dory, PA-C   Allergies  Allergen Reactions  . Tylenol [Acetaminophen] Other (See Comments)    unknown  . Aspirin Other (See Comments)    Stomach upset  . Latex Rash  . Oxycodone-Acetaminophen Itching   Review of Systems  Constitutional: Positive for fatigue and unexpected weight change.  Neurological: Positive for weakness.    Physical Exam  Constitutional: He appears cachectic. He appears ill.  Cardiovascular: Tachycardia present.  Pulmonary/Chest: He has decreased breath sounds in the right lower field and the left lower field.  Musculoskeletal:  Generalized weakness and muscle atrophy  Skin: Skin is warm and dry.    Vital Signs: BP 94/70 (BP Location: Right Arm)   Pulse (!) 102   Temp 98.6 F (37 C) (Oral)   Resp (!) 25   Ht 5\' 8"  (1.727 m)   Wt 67.6 kg (149 lb)   SpO2 99%   BMI 22.66 kg/m  Pain Assessment: No/denies pain   Pain Score: 0-No pain   SpO2: SpO2: 99 % O2 Device:SpO2: 99 % O2 Flow Rate: .O2 Flow Rate (L/min): 1 L/min  IO: Intake/output summary:   Intake/Output Summary (Last 24 hours) at 02/23/2018 1610 Last data filed at 02/23/2018 0300 Gross per 24 hour  Intake 2092.5 ml  Output -  Net 2092.5 ml    LBM:   Baseline Weight: Weight: 67.6 kg (149 lb) Most recent weight: Weight: 67.6 kg (149 lb)     Palliative Assessment/Data: 30 % at best   Discussed with bedside nursing and Resident Dr Renaldo Reel  Time In: 1345 Time Out: 1500 Time Total: 75 minutes Greater than 50%  of this time was spent  counseling and coordinating care related to the  above assessment and plan.  Signed by: Wadie Lessen, NP   Please contact Palliative Medicine Team phone at [phone] for questions and concerns.  For individual provider: See Shea Evans

## 2018-02-23 NOTE — Anesthesia Preprocedure Evaluation (Addendum)
Anesthesia Evaluation  Patient identified by MRN, date of birth, ID band Patient awake    Reviewed: Allergy & Precautions, NPO status , Patient's Chart, lab work & pertinent test results  Airway Mallampati: I  TM Distance: >3 FB Neck ROM: Full    Dental  (+) Edentulous Lower, Edentulous Upper   Pulmonary Current Smoker,    Pulmonary exam normal        Cardiovascular hypertension, + CAD  Normal cardiovascular exam  10/18 echo- The   estimated ejection fraction was in the range of 60% to 65%.   Although no diagnostic regional wall motion abnormality was   identified, t   Neuro/Psych negative neurological ROS     GI/Hepatic Neg liver ROS, GERD  Medicated,(t with peg due to inadequate caloric intake   Endo/Other  diabetes, Type 2  Renal/GU      Musculoskeletal   Abdominal   Peds  Hematology   Anesthesia Other Findings   Reproductive/Obstetrics                           Lab Results  Component Value Date   WBC 8.6 02/23/2018   HGB 7.8 (L) 02/23/2018   HCT 23.6 (L) 02/23/2018   MCV 79.7 02/23/2018   PLT 368 02/23/2018    Anesthesia Physical Anesthesia Plan  ASA: IV  Anesthesia Plan: MAC   Post-op Pain Management:    Induction: Intravenous  PONV Risk Score and Plan:   Airway Management Planned: Mask, Natural Airway and Nasal Cannula  Additional Equipment:   Intra-op Plan:   Post-operative Plan:   Informed Consent: I have reviewed the patients History and Physical, chart, labs and discussed the procedure including the risks, benefits and alternatives for the proposed anesthesia with the patient or authorized representative who has indicated his/her understanding and acceptance.     Plan Discussed with: CRNA  Anesthesia Plan Comments:        Anesthesia Quick Evaluation

## 2018-02-23 NOTE — Progress Notes (Signed)
Pharmacy Antibiotic Note  [name] is a [age] y.o. 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  Pharmacy has been consulted for Vancomycin and Zosyn dosing. MD now concerned for Sacral Osteomyelitis, so will target higher trough for Vanc.  Microbiology history: Coag Neg Staph in 1 out 2 blood cultures 09/05/17 and Coag Neg Staph in Trach asp on 09/18/17.   A Vancomycin trough this morning resulted as SUPRAtherapeutic (VT 33 mcg/ml, goal of 15-20 mcg/ml). Will hold and reduce the dose starting on 3/19 AM. F/u new VT at ss.   Plan: - Hold Vancomycin doses until levels trend down - Restart Vancomycin at 750 mg IV every 12 hours starting on 3/19 AM - Will get another trough at steady state to monitor levels - Continue Zosyn 3.375g IV every 8 hours (infused over 4 hours) -  Will continue to follow renal function, culture results, LOT, and antibiotic de-escalation plans   Height: 5\' 8"  (172.7 cm) Weight: 149 lb (67.6 kg) IBW/kg (Calculated) : 68.4  Temp (24hrs), Avg:98.4 F (36.9 C), Min:98.1 F (36.7 C), Max:99 F (37.2 C)  Recent Labs  Lab 02/21/18 1146 02/21/18 1200 02/21/18 1201 02/21/18 1511 02/21/18 1640 02/22/18 0751 02/23/18 0324  WBC 9.2  --   --   --   --  7.6 8.6  CREATININE 0.80 0.70  --  0.67  --  0.60* 0.72  LATICACIDVEN  --   --  2.17*  --  1.2  --   --     Estimated Creatinine Clearance: 89.2 mL/min (by C-G formula based on SCr of 0.72 mg/dL).    Allergies  Allergen Reactions  . Tylenol [Acetaminophen] Other (See Comments)    unknown  . Aspirin Other (See Comments)    Stomach upset  . Latex Rash  . Oxycodone-Acetaminophen Itching    Antimicrobials this admission: Vancomycin 3/16 >> Zosyn 3/16 >>  Dose adjustments this admission: 3/17: Dose adjusted from 1500 mg/12h >> 750  mg/8h 3/18: VT 33 mcg/ml - doses held and resumed at 750 mg/12h  Microbiology results: 3/16 BCx >> ngtd  Thank you for allowing pharmacy to be a part of this patient's care.  Georgina PillionElizabeth Pulse, PharmD, BCPS Clinical Pharmacist Pager: [phone] Clinical phone for 02/23/2018 from 7a-3:30p: [phone] If after 3:30p, please call main pharmacy at: [phone] 02/23/2018 4:30 PM

## 2018-02-23 NOTE — Op Note (Signed)
[facility] Patient Name: Clinton Harrington Procedure Date : 02/23/2018 MRN: [id number] Attending MD: Tresea Mall Dr., MD Date of Birth: 1951/04/12 CSN: [id number] Age: 67 Admit Type: Inpatient Procedure:                Upper GI endoscopy Indications:              Hematemesis Providers:                Fayrene Fearing L. Justa Hatchell Dr., MD, Norman Clay, RN, Madalyn Rob, Technician Referring MD:              Medicines:                Monitored Anesthesia Care Complications:            No immediate complications. Estimated Blood Loss:     Estimated blood loss: none. Procedure:                Pre-Anesthesia Assessment:                           - Prior to the procedure, a History and Physical                            was performed, and patient medications and                            allergies were reviewed. The patient's tolerance of                            previous anesthesia was also reviewed. The risks                            and benefits of the procedure and the sedation                            options and risks were discussed with the patient.                            All questions were answered, and informed consent                            was obtained. Prior Anticoagulants: The patient has                            taken no previous anticoagulant or antiplatelet                            agents. ASA Grade Assessment: IV - A patient with                            severe systemic disease that is a constant threat  to life. After reviewing the risks and benefits,                            the patient was deemed in satisfactory condition to                            undergo the procedure.                           After obtaining informed consent, the endoscope was                            passed under direct vision. Throughout the                            procedure, the patient's blood pressure,  pulse, and                            oxygen saturations were monitored continuously. The                            Endoscope was introduced through the mouth, and                            advanced to the second part of duodenum. The upper                            GI endoscopy was accomplished without difficulty.                            The patient tolerated the procedure well. Scope In: Scope Out: Findings:      The esophagus was normal.      There was evidence of a dislodged gastrostomy tube present on the       anterior wall of the stomach. This was characterized by balloon       obstructing duod bulb. it was passing to the pyloric channel. We were       able to passinto the duodenum      The examined duodenum was normal. Impression:               - Normal esophagus.                           - Dislodged gastrostomy tube present characterized                            by balloon obstructing duod bulb.                           - Normal examined duodenum.                           - No specimens collected.                           - Hematemesis without active  bleeding currently Moderate Sedation:      See anesthesia note, no moderate sedation. Recommendation:           - Return patient to hospital ward for ongoing care.                           - NPO.                           - will have radiology replace or relocate the PEG                            tube                           - Continue present medications. Procedure Code(s):        --- Professional ---                           [phone], Esophagogastroduodenoscopy, flexible,                            transoral; diagnostic, including collection of                            specimen(s) by brushing or washing, when performed                            (separate procedure) Diagnosis Code(s):        --- Professional ---                           U04.54K94.23, Gastrostomy malfunction                           K92.0,  Hematemesis CPT copyright 2016 American [employer]. All rights reserved. The codes documented in this report are preliminary and upon coder review may  be revised to meet current compliance requirements. Tresea MallJames L Malaysha Arlen Dr., MD 02/23/2018 12:12:45 PM This report has been signed electronically. Number of Addenda: 0

## 2018-02-23 NOTE — Evaluation (Signed)
Physical Therapy One time Evaluation Patient Details Name: Clinton Harrington MRN: [id number] DOB: 01-16-1951 Today's Date: 02/23/2018   History of Present Illness  Clinton Harrington is a 67 y.o. 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   Clinical Impression  Pt admitted with above diagnosis. Pt currently with significant functional limitations at baseline and was total care per [facility].  Pt had transitioned to long term care a few weeks ago and if pt got OOB, lift was used.  No PT needs as pt total care at baseline.  Will sign off.    Follow Up Recommendations SNF;Supervision/Assistance - 24 hour(possibly PT to set up a restorative program)    Equipment Recommendations  None recommended by PT    Recommendations for Other Services       Precautions / Restrictions Precautions Precautions: Fall Restrictions Weight Bearing Restrictions: No      Mobility  Bed Mobility Overal bed mobility: Needs Assistance Bed Mobility: Rolling Rolling: Max assist;+2 for physical assistance;Total assist         General bed mobility comments: Pt initiates movement but is really weak and has difficulty completing movements.  Transfers                 General transfer comment: Pt declined EOB due to pain and fatigue.  Ambulation/Gait                Stairs            Wheelchair Mobility    Modified Rankin (Stroke Patients Only)       Balance                                             Pertinent Vitals/Pain Pain Assessment: Faces Faces Pain Scale: Hurts even more Pain Location: multiple wounds Pain Descriptors / Indicators: Grimacing;Guarding;Discomfort Pain Intervention(s): Limited activity within patient's tolerance;Monitored during session;Repositioned     Home Living Family/patient expects to be discharged to:: Skilled nursing facility                 Additional Comments: Was at [employer] PTA    Prior Function Level of Independence: Needs assistance   Gait / Transfers Assistance Needed: Pt states they used lift toget him OOB  ADL's / Homemaking Assistance Needed: total care per pt  Comments: Called Nurse, April,  at [facility] who cared for pt.  She states pt was performing pivots or use of lift with therapy until about two weeks ago when he transferred to long term care.  Pt was total care for B/D.      Hand Dominance   Dominant Hand: Right    Extremity/Trunk Assessment   Upper Extremity Assessment Upper Extremity Assessment: Defer to OT evaluation    Lower Extremity Assessment Lower Extremity Assessment: RLE deficits/detail;LLE deficits/detail RLE Deficits / Details: grossly 2-/5 RLE: Unable to fully assess due to pain LLE Deficits / Details: grossly 2-/5 LLE: Unable to fully assess due to pain    Cervical / Trunk Assessment Cervical / Trunk Assessment: Kyphotic  Communication   Communication: (mumbles so difficult to understand)  Cognition Arousal/Alertness: Awake/alert Behavior During Therapy: Flat affect Overall Cognitive Status: Within Functional  Limits for tasks assessed                                        General Comments      Exercises General Exercises - Upper Extremity Shoulder Flexion: AAROM;Both;10 reps;Supine Elbow Flexion: AAROM;Both;10 reps;Supine General Exercises - Lower Extremity Ankle Circles/Pumps: AAROM;Both;10 reps;Supine Heel Slides: AAROM;Both;10 reps;Supine   Assessment/Plan    PT Assessment Patent does not need any further PT services;All further PT needs can be met in the next venue of care  PT Problem List Decreased activity tolerance;Decreased mobility;Pain       PT Treatment Interventions      PT Goals (Current goals can be  found in the Care Plan section)  Acute Rehab PT Goals Patient Stated Goal: to go to SNF PT Goal Formulation: All assessment and education complete, DC therapy    Frequency     Barriers to discharge        Co-evaluation               AM-PAC PT "6 Clicks" Daily Activity  Outcome Measure Difficulty turning over in bed (including adjusting bedclothes, sheets and blankets)?: Unable Difficulty moving from lying on back to sitting on the side of the bed? : Unable Difficulty sitting down on and standing up from a chair with arms (e.g., wheelchair, bedside commode, etc,.)?: Unable Help needed moving to and from a bed to chair (including a wheelchair)?: Total Help needed walking in hospital room?: Total Help needed climbing 3-5 steps with a railing? : Total 6 Click Score: 6    End of Session Equipment Utilized During Treatment: Gait belt;Oxygen Activity Tolerance: Patient limited by fatigue;Patient limited by pain Patient left: in bed;with call bell/phone within reach Nurse Communication: Mobility status;Need for lift equipment PT Visit Diagnosis: Pain;Other abnormalities of gait and mobility (R26.89) Pain - part of body: (generalized)    Time: 1497-0263 PT Time Calculation (min) (ACUTE ONLY): 10 min   Charges:   PT Evaluation $PT Eval Low Complexity: 1 Low     PT G Codes:        Lamont Davianna Deutschman,PT Acute Rehabilitation [phone] (pager)   Denice Paradise 02/23/2018, 12:11 PM

## 2018-02-23 NOTE — Anesthesia Postprocedure Evaluation (Signed)
Anesthesia Post Note  Patient: Clinton Harrington  Procedure(s) Performed: ESOPHAGOGASTRODUODENOSCOPY (EGD) WITH PROPOFOL (N/A )     Patient location during evaluation: Endoscopy Anesthesia Type: MAC Level of consciousness: awake and alert Pain management: pain level controlled Vital Signs Assessment: post-procedure vital signs reviewed and stable Respiratory status: spontaneous breathing, nonlabored ventilation, respiratory function stable and patient connected to nasal cannula oxygen Cardiovascular status: stable and blood pressure returned to baseline Postop Assessment: no apparent nausea or vomiting Anesthetic complications: no    Last Vitals:  Vitals:   02/23/18 1220 02/23/18 1230  BP: (!) 91/58 (!) 93/55  Pulse: 99 (!) 114  Resp: 20 (!) 26  Temp:    SpO2: 96% 96%    Last Pain:  Vitals:   02/23/18 1201  TempSrc: Oral  PainSc:                  Barnet Glasgow

## 2018-02-23 NOTE — Care Management Note (Signed)
Case Management Note  Patient Details  Name: Clinton Harrington MRN: [id number] Date of Birth: 1951/07/23  Subjective/Objective:      Admitted for Hematemesis; also has Sacral osteomyelitis. [name]  PCP noted.      Action/Plan: GI consulted: EGD. Palliative Care consulted for goals of care. When patient is medically stable for dc and bed available plan is to return to SNF, per CSW arrangements.   Expected Discharge Date:  02/24/18               Expected Discharge Plan:  Skilled Nursing Facility  In-House Referral:  Clinical Social Work  Discharge planning Services  CM Consult   Status of Service:  In process, will continue to follow  If discussed at Long Length of Stay Meetings, dates discussed:    Additional Comments:  Yancey FlemingsKimberly R Becton, RN  Nurse case manager [place] 02/23/2018, 10:44 AM

## 2018-02-23 NOTE — Transfer of Care (Signed)
Immediate Anesthesia Transfer of Care Note  Patient: Clinton Harrington  Procedure(s) Performed: ESOPHAGOGASTRODUODENOSCOPY (EGD) WITH PROPOFOL (N/A )  Patient Location: PACU  Anesthesia Type:MAC  Level of Consciousness: awake, alert , oriented and patient cooperative  Airway & Oxygen Therapy: Patient Spontanous Breathing and Patient connected to nasal cannula oxygen  Post-op Assessment: Report given to RN, Post -op Vital signs reviewed and stable and Patient moving all extremities  Post vital signs: Reviewed and stable  Last Vitals:  Vitals:   02/23/18 1041 02/23/18 1201  BP: (!) 87/57 (!) 85/45  Pulse: (!) 102 96  Resp: 20 20  Temp: 36.9 C 36.7 C  SpO2: 96% 96%    Last Pain:  Vitals:   02/23/18 1201  TempSrc: Oral  PainSc:       Patients Stated Pain Goal: 2 (92/33/00 7622)  Complications: No apparent anesthesia complications

## 2018-02-23 NOTE — Consult Note (Addendum)
   [facility]   02/23/2018  [name] [birth year] [id number]    [facility] due to multiple hospitalizations.  Chart reviewed. Noted [name] admitted from SNF. Palliative consult pending.   Will continue to follow along. Will engage for potential [facility] services when appropriate.    Raiford NobleAtika Shoji Pertuit, MSN-Ed, RN,BSN [facility] Care [facility] Hospital Liaison [phone]

## 2018-02-23 NOTE — Interval H&P Note (Signed)
History and Physical Interval Note:  02/23/2018 11:23 AM  Clinton Harrington  has presented today for surgery, with the diagnosis of hematemesis  The various methods of treatment have been discussed with the patient and family. After consideration of risks, benefits and other options for treatment, the patient has consented to  Procedure(s): ESOPHAGOGASTRODUODENOSCOPY (EGD) WITH PROPOFOL (N/A) as a surgical intervention .  The patient's history has been reviewed, patient examined, no change in status, stable for surgery.  I have reviewed the patient's chart and labs.  Questions were answered to the patient's satisfaction.     Tresea MallJames L Rajni Holsworth

## 2018-02-24 DIAGNOSIS — Z515 Encounter for palliative care: Secondary | ICD-10-CM

## 2018-02-24 DIAGNOSIS — R627 Adult failure to thrive: Secondary | ICD-10-CM

## 2018-02-24 DIAGNOSIS — Z7189 Other specified counseling: Secondary | ICD-10-CM

## 2018-02-24 DIAGNOSIS — A419 Sepsis, unspecified organism: Secondary | ICD-10-CM

## 2018-02-24 LAB — BASIC METABOLIC PANEL
Anion gap: 9 (ref 5–15)
BUN: <5 mg/dL — ABNORMAL LOW (ref 6–20)
CALCIUM: 7.5 mg/dL — AB (ref 8.9–10.3)
CO2: 17 mmol/L — AB (ref 22–32)
CREATININE: 0.9 mg/dL (ref 0.61–1.24)
Chloride: 108 mmol/L (ref 101–111)
GFR calc Af Amer: >60 mL/min (ref >60)
GFR calc non Af Amer: >60 mL/min (ref >60)
GLUCOSE: 129 mg/dL — AB (ref 65–99)
Potassium: 3.8 mmol/L (ref 3.5–5.1)
Sodium: 134 mmol/L — ABNORMAL LOW (ref 135–145)

## 2018-02-24 LAB — CBC
HEMATOCRIT: 27.1 % — AB (ref 39.0–52.0)
Hemoglobin: 8 g/dL — ABNORMAL LOW (ref 13.0–17.0)
MCH: 24 pg — ABNORMAL LOW (ref 26.0–34.0)
MCHC: 29.5 g/dL — ABNORMAL LOW (ref 30.0–36.0)
MCV: 81.4 fL (ref 78.0–100.0)
Platelets: 368 10*3/uL (ref 150–400)
RBC: 3.33 MIL/uL — ABNORMAL LOW (ref 4.22–5.81)
RDW: 16.8 % — AB (ref 11.5–15.5)
WBC: 9.4 10*3/uL (ref 4.0–10.5)

## 2018-02-24 LAB — GLUCOSE, CAPILLARY: Glucose-Capillary: 148 mg/dL — ABNORMAL HIGH (ref 65–99)

## 2018-02-24 LAB — MRSA PCR SCREENING: MRSA by PCR: NEGATIVE

## 2018-02-24 MED ORDER — PANTOPRAZOLE SODIUM 40 MG PO PACK
40.0000 mg | PACK | Freq: Every day | ORAL | Status: DC
  Administered 2018-02-24 – 2018-02-25 (×2): 40 mg
  Filled 2018-02-24 (×2): qty 20

## 2018-02-24 NOTE — Progress Notes (Signed)
OT Cancellation Note  Patient Details Name: Clinton HammockReginald E Harrington MRN: [id number] DOB: [birth year]   Cancelled Treatment:    Reason Eval/Treat Not Completed: OT screened, pt totally dependent at baseline at SNF and has transitioned to long term care there. Acute OT will sign off. Please re-order if needs change.  Doristine Sectionharity A Jovonda Selner, MS OTR/L  Pager: [phone]   Rosa Wyly A Filimon Miranda 02/24/2018, 7:19 AM

## 2018-02-24 NOTE — Progress Notes (Signed)
Subjective:  Clinton Harrington was resting comfortably in bed. He endorsed mild lightheadedness. Denies chest pain or shortness of breath. No recurrent episodes of hematemesis. Denies abdominal pain or blood in stools. Patient endorses feeling hungry this morning.  Objective:  Vital signs in last 24 hours: Vitals:   02/24/18 0032 02/24/18 0405 02/24/18 0746 02/24/18 0924  BP: 97/67 (!) 85/65 (!) 80/59   Pulse: (!) 102 99 98   Resp: (!) 27 (!) 27 (!) 25   Temp: 98 F (36.7 C) 98.5 F (36.9 C) 98.9 F (37.2 C)   TempSrc: Oral Oral Oral   SpO2: 95% 96% 96% 97%  Weight:      Height:       GEN: Chronically ill, thin male sitting up in bed. Alert and oriented. No acute distress. Edentulous RESP: Clear to auscultation bilaterally. No increased work of breathing, on RA. CV: Slightly tachycardic and regular rhythm. No murmurs, gallops, or rubs. No LE edema. Well-healed midline sternotomy scar with 1x1cm round pink wound at the bottom end of the scar. ABD: Soft. Mild suprapubic tenderness. Non-distended. Normoactive bowel sounds. G-tube site without evidence of erythema or tenderness. EXT: No edema. Warm. 2+ DP pulses. Bilateral feet in space boots. Bilateral heel pressure injuries, R worse than L SACRUM: ~5x7cm unstageable sacral pressure injury with feculent matter, overlying dressing. NEURO: Cranial nerves II-XII grossly intact. Able to lift all four extremities against gravity. No apparent audiovisual hallucinations. Speech fluent and appropriate. PSYCH: Patient is calm and pleasant. Appropriate affect. Well-groomed; speech is appropriate and on-subject.  Assessment/Plan:  Active Problems:   Ulcer of heel and midfoot (HCC)   Hematemesis   Sacral pressure ulcer   Normocytic anemia   Gastrointestinal tube present (HCC)   Hyponatremia  Clinton Harrington is a 67yo male with PMH of afib, CAD s/p CABG x4 in 08/2017, diabetes, HTN, PVD s/p L fem-pop bypass, HLD, COPD, and recent ex-lap in 09/2017 for  pneumoperitoneum from leaking G-tube, and PEG tube who presents from SNF with an episode of coffee ground emesis, no further recurrence, although Hb has dropped to 7.8 from baseline ~8.5-10. CT A/P on admission does not show an etiology for his hematemesis, but did note concern for sacral osteomyelitis. He was started on Vanc/zosyn in the ED. Afebrile and HDS. Hyponatremia improving with fluids. EGD showed dislodged PEG tube but no active bleeding. PEG tube adjusted by IR and now OK to use.  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 Patient is hemodynamically stable and afebrile, however antibiotics were already started in the ED. Will continue for now and attempt to obtain cultures (although obtained after antibiotics had been initiated). Further work-up pending Palliative Care discussion. - Wound care consulted; appreciate their assistance - Hydrotherapy, per wound care - Continue vancomycin and zosyn, per pharm (3/16 -> ) - F/u BCx 3/16 -> NG x4d - Further management, pending palliative care discussion  Hypotonic hyponatremia, improved with IVF Na 134 this AM, improving with IVF. Likely secondary to hypovolemia and low dietary intake. - BMP in AM - Continue IVF, as above  Hypokalemia, improved K 3.8 this AM - D5W 75cc/hr + K 7420mEq/L - BMP in AM - Mg in AM  Hematemesis, resolved EGD showed no source of bleeding, but did show that PEG tube was dislodged and obstructing outlet. Emesis possibly secondary to obstruction. IR repositioned PEG tube. OK to use PEG tube. No recurrent episodes while inpatient. Hb stable at 8.0. - Continue telemetry - Change to home PO protonix  40mg  daily - CBC in AM - Advance to CLD today - D5W at 75cc/hr  Hx of atrial fibrillation/flutter In setting of CABG and acute illness. S/p DCCV 08/2017. Was on amiodarone, which has since been discontinued. Also was on home warfarin, however most recent note from [facility] in 12/2017 notes  he has remained in NSR for 3 months post-op and may no longer require warfarin. Currently in NSR, HR in 90s-100s. - Telemetry - Continue holding home warfarin  CAD s/p CABG x4 in 08/2017 New RBBB Troponins peaked at 0.03. EKG without ischemic changes. Denies current chest pain. - Hold home plavix in setting of hematemesis  Hx of HTN BP continues to be soft 85/62, this AM. Not on any home anti-hypertensives. Per chart review, patient has soft-normal BP since recent prolonged hospitalizations. - Continue to monitor - IVF, as above  Hx of Diabetes Not on any home medications. Currently on gabapentin 900mg  BID and tramadol 50mg  TID PRN for neuropathy. Can consider cymbalta, aspercream, or lidocaine ointment if still no relief. - CBG monitoring q4h - Continue home tramadol 50mg  q8h PRN  COPD Home regimen includes budesonide nebulizer. Stable, no wheezing on exam. - Continue home budenoside nebulizer PRN for SOB or wheezing  Severe deconditioning, malnourishment Presenting from SNF, reports that he has been able to work with therapy while at [facility]. Has had multiple hospitalizations recently. - PT/OT eval -> Return to SNF - IV thiamine, folate  Goals of Care Currently full code. Has had recent prolonged hospitalization, sacral osteomyelitis, PEG tube with chronic debility. Reports he has a daughter and brother who live in [place], [place]. Talks to them occasionally. It doesn't sound like he has much support in general. - Palliative Care consulted; appreciate their assistance  Dispo: Anticipated discharge in approximately 2-3 day(s).   Scherrie Gerlach, MD 02/24/2018, 10:39 AM Pager: Demetrius Charity [phone]

## 2018-02-24 NOTE — Progress Notes (Signed)
Physical Therapy Wound Evaluation and Treatment Patient Details  Name: Clinton Harrington MRN: [id number] Date of Birth: 09-Sep-1951  Today's Date: 02/24/2018 Time: 8768-1157 Time Calculation (min): 80 min  Subjective  Subjective: Pt reports wanting the lights turned out so he can go to sleep Patient and Family Stated Goals: pt wants to go to sleep  Pain Score:  Pt premedicated prior to treatment however cried out in 10/10 pain with pulsed lavage of heels   Wound Assessment  Pressure Injury 02/21/18 Unstageable - Full thickness tissue loss in which the base of the ulcer is covered by slough (yellow, tan, gray, green or brown) and/or eschar (tan, brown or black) in the wound bed. R heel (Active)  Dressing Type Other (Comment);Gauze (Comment);ABD;Moist to dry 02/24/2018  4:00 PM  Dressing Clean;Dry;Intact 02/24/2018  4:00 PM  Dressing Change Frequency Daily 02/24/2018  4:00 PM  State of Healing Non-healing 02/24/2018  4:00 PM  Site / Wound Assessment Black;Brown;Dry;Pink;Red;Painful;Yellow 02/24/2018  4:00 PM  % Wound base Red or Granulating 60% 02/24/2018  4:00 PM  % Wound base Yellow/Fibrinous Exudate 35% 02/24/2018  4:00 PM  % Wound base Black/Eschar 5% 02/24/2018  4:00 PM  Peri-wound Assessment Intact 02/22/2018  4:30 AM  Wound Length (cm) 7 cm 02/24/2018  4:00 PM  Wound Width (cm) 3.5 cm 02/24/2018  4:00 PM  Wound Depth (cm) 1 cm 02/24/2018  4:00 PM  Wound Surface Area (cm^2) 24.5 cm^2 02/24/2018  4:00 PM  Wound Volume (cm^3) 24.5 cm^3 02/24/2018  4:00 PM  Margins Unattached edges (unapproximated) 02/24/2018  4:00 PM  Drainage Amount Minimal 02/24/2018  4:00 PM  Drainage Description Serosanguineous 02/24/2018  4:00 PM  Treatment Cleansed;Debridement (Selective);Hydrotherapy (Pulse lavage);Packing (Saline gauze) 02/24/2018  4:00 PM     Pressure Injury 02/21/18 Unstageable - Full thickness tissue loss in which the base of the ulcer is covered by slough (yellow, tan, gray, green or brown) and/or  eschar (tan, brown or black) in the wound bed. L heel (Active)  Dressing Type ABD;Gauze (Comment);Moist to dry;Other (Comment) 02/24/2018  4:00 PM  Dressing Clean;Dry;Intact 02/24/2018  4:00 PM  Dressing Change Frequency Daily 02/24/2018  4:00 PM  State of Healing Early/partial granulation 02/24/2018  4:00 PM  Site / Wound Assessment Red;Yellow;Pink;Granulation tissue 02/24/2018  4:00 PM  % Wound base Red or Granulating 60% 02/24/2018  4:00 PM  % Wound base Yellow/Fibrinous Exudate 40% 02/24/2018  4:00 PM  % Wound base Black/Eschar 0% 02/22/2018  4:30 AM  Peri-wound Assessment Intact 02/22/2018  4:30 AM  Wound Length (cm) 5.5 cm 02/24/2018  4:00 PM  Wound Width (cm) 3.5 cm 02/24/2018  4:00 PM  Wound Depth (cm) 1 cm 02/24/2018  4:00 PM  Wound Surface Area (cm^2) 19.25 cm^2 02/24/2018  4:00 PM  Wound Volume (cm^3) 19.25 cm^3 02/24/2018  4:00 PM  Margins Unattached edges (unapproximated) 02/24/2018  4:00 PM  Drainage Amount Minimal 02/24/2018  4:00 PM  Drainage Description Serosanguineous;Odor 02/24/2018  4:00 PM  Treatment Cleansed;Debridement (Selective);Hydrotherapy (Pulse lavage);Packing (Saline gauze) 02/24/2018  4:00 PM     Pressure Injury 02/21/18 Unstageable - Full thickness tissue loss in which the base of the ulcer is covered by slough (yellow, tan, gray, green or brown) and/or eschar (tan, brown or black) in the wound bed. Coccyx (Active)  Dressing Type Barrier Film (skin prep);ABD;Gauze (Comment);Moist to dry 02/24/2018  4:00 PM  Dressing Clean;Dry;Intact 02/24/2018  4:00 PM  Dressing Change Frequency Daily 02/24/2018  4:00 PM  State of Healing Early/partial granulation 02/24/2018  4:00 PM  Site / Wound Assessment Red;Granulation tissue;Yellow;Pink;Painful;Brown 02/24/2018  4:00 PM  % Wound base Red or Granulating 50% 02/24/2018  4:00 PM  % Wound base Yellow/Fibrinous Exudate 45% 02/24/2018  4:00 PM  % Wound base Black/Eschar 5% 02/24/2018  4:00 PM  Peri-wound Assessment Intact 02/22/2018  4:30 AM  Wound  Length (cm) 8 cm 02/24/2018  4:00 PM  Wound Width (cm) 9.5 cm 02/24/2018  4:00 PM  Wound Depth (cm) 4 cm 02/24/2018  4:00 PM  Wound Surface Area (cm^2) 76 cm^2 02/24/2018  4:00 PM  Wound Volume (cm^3) 304 cm^3 02/24/2018  4:00 PM  Undermining (cm) 12-2 o'clock 1 cm, 2-5 o'clock 2 cm 5-6 o-clock 1 cm , 10-11 o'clock 2 cm 11-12 o'clock 1 cm 02/24/2018  4:00 PM  Margins Unattached edges (unapproximated) 02/24/2018  4:00 PM  Drainage Amount Moderate 02/24/2018  4:00 PM  Drainage Description Odor;Serosanguineous;Purulent 02/24/2018  4:00 PM  Treatment Cleansed;Debridement (Selective);Hydrotherapy (Pulse lavage);Packing (Saline gauze) 02/24/2018  4:00 PM   Santyl applied to necrotic tissue   Hydrotherapy Pulsed lavage therapy - wound location: bilateral heels and coccyx Pulsed Lavage with Suction (psi): 4 psi(4-12 psi) Pulsed Lavage with Suction - Normal Saline Used: 2000 mL Pulsed Lavage Tip: Tip with splash shield Selective Debridement Selective Debridement - Location: bilateral heels and coccyx Selective Debridement - Tools Used: Forceps;Scalpel;Scissors Selective Debridement - Tissue Removed: necrotic eschar and yellow stringy slough   Wound Assessment and Plan  Wound Therapy - Assess/Plan/Recommendations Wound Therapy - Clinical Statement: Pt has pressure injuries to bilateral heels (R worse that L) and coccyx at various stages of healing that would benefit from pulsed lavage and selective debridement to decrease bioburden and promote healing.  Factors Delaying/Impairing Wound Healing: Multiple medical problems;Diabetes Mellitus;Immobility Hydrotherapy Plan: Debridement;Dressing change;Pulsatile lavage with suction;Patient/family education Wound Therapy - Frequency: 6X / week Wound Therapy - Follow Up Recommendations: Skilled nursing facility Wound Plan: see above  Wound Therapy Goals- Improve the function of patient's integumentary system by progressing the wound(s) through the phases of wound  healing (inflammation - proliferation - remodeling) by: Decrease Necrotic Tissue to: 80 Decrease Necrotic Tissue - Progress: Goal set today Increase Granulation Tissue to: 20 Increase Granulation Tissue - Progress: Goal set today Goals/treatment plan/discharge plan were made with and agreed upon by patient/family: Yes Time For Goal Achievement: 7 days Wound Therapy - Potential for Goals: Fair  Goals will be updated until maximal potential achieved or discharge criteria met.  Discharge criteria: when goals achieved, discharge from hospital, MD decision/surgical intervention, no progress towards goals, refusal/missing three consecutive treatments without notification or medical reason.  GP    Dani Gobble. Migdalia Dk PT, DPT Acute Rehabilitation  [phone] Pager [phone]   [place] 02/24/2018, 5:10 PM

## 2018-02-24 NOTE — Progress Notes (Signed)
Radiology has come by and reposition the PEG tube.Patient is back on clear liquids.  He states that he is tolerating these without any problem.  He should be okay to resume on tube feedings and use of the PEG tube.  No signs of ongoing bleeding.  There are any further problems please call back.  We will see again as needed.

## 2018-02-24 NOTE — [place] FL2 (Signed)
[place] MEDICAID FL2 LEVEL OF CARE SCREENING TOOL     IDENTIFICATION  Patient Name: Clinton HammockReginald E Moncrieffe Birthdate: 30-Dec-1950 Sex: male Admission Date (Current Location): 02/21/2018  [facility] [place]   Facility and Address[facility]  [facility], [address], [place], [place] [address]      Provider Number: [id number]  Attending Physician Name and Address:  Gust RungHoffman, Erik C, DO  Relative Name and Phone Number:       Current Level of Care: Hospital Recommended Level of Care: Skilled Nursing Facility Prior Approval Number:    Date Approved/Denied:   PASRR Number:    Discharge Plan: SNF    Current Diagnoses: Patient Active Problem List   Diagnosis Date Noted  . Sacral pressure ulcer 02/23/2018  . Normocytic anemia 02/23/2018  . Gastrointestinal tube present (HCC) 02/23/2018  . Hyponatremia 02/23/2018  . Hematemesis 02/21/2018  . Bowel obstruction (HCC) 10/07/2017  . Gastrojejunostomy tube dislodgement (HCC) 10/07/2017  . Dysphagia   . Acute encephalopathy   . HCAP (healthcare-associated pneumonia)   . Acute respiratory failure with hypoxemia (HCC)   . Acute pulmonary edema (HCC)   . CHF (congestive heart failure) (HCC)   . Heart failure (HCC) 09/08/2017  . Pressure injury of skin 09/06/2017  . Respiratory failure (HCC)   . STEMI (ST elevation myocardial infarction) (HCC) 08/23/2017  . Cardiogenic shock (HCC)   . Diabetic polyneuropathy associated with diabetes mellitus due to underlying condition (HCC) 03/28/2016  . PAD (peripheral artery disease) (HCC) 01/23/2016  . Extremity atherosclerosis with gangrene (HCC) 01/01/2016  . Hx of CABG   . Dyslipidemia, goal LDL below 70   . Atherosclerosis of native arteries of the extremities with gangrene (HCC) 12/29/2015  . Ulcer of heel and midfoot (HCC) 11/09/2015  . Diabetes (HCC) 10/30/2012  . Hyperlipemia 10/30/2012  . CAD (coronary artery disease) 10/30/2012  . HTN  (hypertension) 10/30/2012  . Tobacco use 10/30/2012    Orientation RESPIRATION BLADDER Height & Weight     Self, Time, Place  Normal Incontinent, External catheter Weight: 149 lb (67.6 kg) Height:  5\' 8"  (172.7 cm)  BEHAVIORAL SYMPTOMS/MOOD NEUROLOGICAL BOWEL NUTRITION STATUS      Incontinent Diet(see DC summary)  AMBULATORY STATUS COMMUNICATION OF NEEDS Skin   Total Care Verbally Other (Comment)(Unstageable ulcers on right and left heel and coccyx all requiring Moist to Dry dressing)                       Personal Care Assistance Level of Assistance  Total care       Total Care Assistance: Maximum assistance   Functional Limitations Info             SPECIAL CARE FACTORS FREQUENCY                       Contractures      Additional Factors Info  Code Status, Allergies Code Status Info: FULL Allergies Info:  Tylenol Acetaminophen, Aspirin, Latex, Oxycodone-acetaminophen           Current Medications (02/24/2018):  This is the current hospital active medication list Current Facility-Administered Medications  Medication Dose Route Frequency Provider Last Rate Last Dose  . 0.9 %  sodium chloride infusion   Intravenous Continuous Carman ChingEdwards, James, MD 20 mL/hr at 02/23/18 2030    . budesonide (PULMICORT) nebulizer solution 0.25 mg  0.25 mg Nebulization BID Nyra MarketSvalina, Gorica, MD   0.25 mg at 02/24/18  1610  . collagenase (SANTYL) ointment   Topical Daily Narendra, Nischal, MD      . dextrose 5 % and 0.9 % NaCl with KCl 20 mEq/L infusion   Intravenous Continuous Nyra Market, MD 75 mL/hr at 02/23/18 2151    . folic acid injection 1 mg  1 mg Intravenous Daily Nyra Market, MD   1 mg at 02/24/18 0851  . mirtazapine (REMERON) tablet 15 mg  15 mg Oral QHS Nedrud, Jeanella Flattery, MD   15 mg at 02/23/18 2151  . pantoprazole sodium (PROTONIX) 40 mg/20 mL oral suspension 40 mg  40 mg Per Tube Daily Scherrie Gerlach, MD      . piperacillin-tazobactam (ZOSYN) IVPB 3.375 g   3.375 g Intravenous Q8H Link Snuffer B, RPH 12.5 mL/hr at 02/24/18 0514 3.375 g at 02/24/18 0514  . thiamine (B-1) injection 100 mg  100 mg Intravenous Daily Nyra Market, MD   100 mg at 02/24/18 0850  . traMADol (ULTRAM) tablet 50 mg  50 mg Oral Q8H PRN Rozann Lesches, MD   50 mg at 02/24/18 0902  . vancomycin (VANCOCIN) IVPB 750 mg/150 ml premix  750 mg Intravenous Q12H Marcello, Tuzzolino, [facility]   Stopped at 02/24/18 9604     Discharge Medications: Please see discharge summary for a list of discharge medications.  Relevant Imaging Results:  Relevant Lab Results:   Additional Information SS#: [id number]  Burna Sis, LCSW

## 2018-02-24 NOTE — Progress Notes (Signed)
Patient had a visitor today who presented himself as Lenard ForthDavid Spangler, an attorney. He approached the patient about signing legal documents. The visitor also attempted to get other visitors in the next room over to volunteer as witness to documents.  At this time this RN approached Mr. Lorie PhenixSpangler about the documents in questions. He stated the documents were about the patient's current power of attorney and changes that were to be made. The patient Clinton Harrington, R gave this RN the verbal permission to informed Mr. Spangler about [facility]. After reviewing some of the patient's care, Mr. Lorie PhenixSpangler stated he will be returning tomorrow to further discuss the documents. Will continue to monitor the situation.

## 2018-02-25 DIAGNOSIS — R52 Pain, unspecified: Secondary | ICD-10-CM

## 2018-02-25 LAB — CBC
HCT: 24.3 % — ABNORMAL LOW (ref 39.0–52.0)
HEMOGLOBIN: 7.3 g/dL — AB (ref 13.0–17.0)
MCH: 24.6 pg — ABNORMAL LOW (ref 26.0–34.0)
MCHC: 30 g/dL (ref 30.0–36.0)
MCV: 81.8 fL (ref 78.0–100.0)
PLATELETS: 375 10*3/uL (ref 150–400)
RBC: 2.97 MIL/uL — AB (ref 4.22–5.81)
RDW: 16.9 % — ABNORMAL HIGH (ref 11.5–15.5)
WBC: 7.6 10*3/uL (ref 4.0–10.5)

## 2018-02-25 LAB — MAGNESIUM: MAGNESIUM: 1.8 mg/dL (ref 1.7–2.4)

## 2018-02-25 LAB — BASIC METABOLIC PANEL
ANION GAP: 8 (ref 5–15)
BUN: <5 mg/dL — ABNORMAL LOW (ref 6–20)
CALCIUM: 7.2 mg/dL — AB (ref 8.9–10.3)
CO2: 19 mmol/L — AB (ref 22–32)
CREATININE: 0.82 mg/dL (ref 0.61–1.24)
Chloride: 108 mmol/L (ref 101–111)
Glucose, Bld: 105 mg/dL — ABNORMAL HIGH (ref 65–99)
Potassium: 3.4 mmol/L — ABNORMAL LOW (ref 3.5–5.1)
SODIUM: 135 mmol/L (ref 135–145)

## 2018-02-25 LAB — GLUCOSE, CAPILLARY
GLUCOSE-CAPILLARY: 109 mg/dL — AB (ref 65–99)
GLUCOSE-CAPILLARY: 114 mg/dL — AB (ref 65–99)
GLUCOSE-CAPILLARY: 155 mg/dL — AB (ref 65–99)
GLUCOSE-CAPILLARY: 93 mg/dL (ref 65–99)
Glucose-Capillary: 122 mg/dL — ABNORMAL HIGH (ref 65–99)

## 2018-02-25 MED ORDER — MORPHINE SULFATE (CONCENTRATE) 10 MG/0.5ML PO SOLN
5.0000 mg | ORAL | Status: DC | PRN
  Administered 2018-02-26 – 2018-02-28 (×6): 5 mg via ORAL
  Filled 2018-02-25 (×6): qty 0.5

## 2018-02-25 MED ORDER — POTASSIUM CHLORIDE 20 MEQ/15ML (10%) PO SOLN
40.0000 meq | Freq: Once | ORAL | Status: AC
  Administered 2018-02-25: 40 meq via ORAL
  Filled 2018-02-25: qty 30

## 2018-02-25 MED ORDER — POTASSIUM CHLORIDE CRYS ER 20 MEQ PO TBCR: 40.0000 meq | EXTENDED_RELEASE_TABLET | Freq: Once | ORAL | Status: DC

## 2018-02-25 MED ORDER — MORPHINE SULFATE (CONCENTRATE) 10 MG/0.5ML PO SOLN
5.0000 mg | Freq: Four times a day (QID) | ORAL | Status: AC
  Administered 2018-02-25 – 2018-02-26 (×5): 5 mg via ORAL
  Filled 2018-02-25 (×6): qty 0.5

## 2018-02-25 MED ORDER — LORAZEPAM 1 MG PO TABS: 1.0000 mg | ORAL_TABLET | ORAL | Status: DC | PRN

## 2018-02-25 NOTE — Consult Note (Signed)
Consultation Note Date: 02/25/2018   Patient Name: Clinton Harrington  DOB: 1951-08-29  MRN: [id number]  Age / Sex: 67 y.o., male  PCP: Eloisa Northern, MD Referring Physician: Gust Rung, DO  Reason for Consultation: Establishing goals of care, Non pain symptom management, Pain control and Psychosocial/spiritual support  HPI/Patient Profile: 67 y.o. male  admitted on 02/21/2018 with PMH of afib, CAD s/p CABG x4 in 08/2017, diabetes, HTN, PVD s/p L fem-pop bypass, HLD, COPD, and recent ex-lap in 09/2017 for pneumoperitoneum from leaking G-tube, and PEG tube who presents from [facility] with an episode of coffee ground emesis.  He had a prolonged hospitalization 9/15-10/19 when he presented with chest pain and dyspnea, and underwent CABG x4 and placement of RVAD x1 week on 08/28/2017. He was intubated and required pressors. Amiodarone was added for atrial fibrillation and DCCV was done on 9/20 for atrial flutter with hypotension.  Tracheostomy was placed 10/10 and he was discharged to LTAC on anidulafungin, meropenem, and vancomycin for suspected pneumonia and possible infection from multiple pressure injuries.   Gastrotomy tube placed by IR on 10/24. He was re-admitted 10/30-11/1 for acute abdominal pain.   He underwent ex-lap on 10/30 and required removal and replacement of G-tube due to leaking. He was discharged back to [facility] for ongoing care.  Patient has had continued physical and functional decline in SNF.  Multiple re hospitalizations,  he has extensive sacral and heel wounds.  Patient and his support persons face treatment option decisions, advanced directive decisions, anticipatory care needs and end-of-life care.   Clinical Assessment and Goals of Care:  This NP Lorinda Creed reviewed medical records, received report from team, assessed the patient and then meet at the patient's bedside  along with his lawyer/Mr Lorie Phenix, HPOA/friend Tilden Dome and his wife Clinton Harrington   to discuss diagnosis, prognosis, GOC, EOL wishes disposition and options.  Tilden Dome C# [phone] Murriel Hopper # [phone]  Patient is alert and oriented and fully engaged in today's conversation.  He is able to take in information processing information and speak to the risks and benefits associated with his decisions.   He clearly verbalizes his desire for a comfort, quality and dignity path as he transitions at end of life  Concept of Hospice and Palliative Care were discussed  A detailed discussion was had today regarding advanced directives.  Concepts specific to code status, artifical feeding and hydration, continued IV antibiotics and rehospitalization was had.  The difference between a aggressive medical intervention path  and a palliative comfort care path for this patient at this time was had.  Values and goals of care important to patient and family were attempted to be elicited.  MOST form completed  Natural trajectory and expectations at EOL were discussed.  Questions and concerns addressed.   Family encouraged to call with questions or concerns.    PMT will continue to support holistically.   HCPOA/ Ray Laural Benes    SUMMARY OF RECOMMENDATIONS    Code Status/Advance Care Planning:  DNR-documented today  A copy of healthcare power of attorney, living will, and most form placed in hard chart   Symptom Management:   Pain/genraized: Roxanol 5 mg po/sl every 6 hrs scheduled and every 3 hrs prn  Agitation: Ativan 1 mg p.o./sublingual every 4 hours as needed  Palliative Prophylaxis:   Aspiration, Bowel Regimen, Frequent Pain Assessment and Oral Care  Additional Recommendations (Limitations, Scope, Preferences):  Avoid Hospitalization, Full Comfort Care, Minimize Medications, No Artificial Feeding, No Blood Transfusions, No Diagnostics, No Glucose Monitoring, No IV Antibiotics, No  IV Fluids and No Lab Draws  Psycho-social/Spiritual:   Desire for further Chaplaincy support:yes  Additional Recommendations: Education on Hospice  Prognosis:   Less than a few weeks  Discharge Planning: To Be Determined      Primary Diagnoses: Present on Admission: . Hematemesis . Ulcer of heel and midfoot (HCC) . Sacral pressure ulcer . Normocytic anemia . Hyponatremia   I have reviewed the medical record, interviewed the patient and family, and examined the patient. The following aspects are pertinent.  Past Medical History:  Diagnosis Date  . Arthritis   . Diabetes (HCC)   . Diabetes mellitus Jan. 1999  . High cholesterol   . Hypertension   . Myocardial infarct (HCC)   . Peripheral vascular disease (HCC)   . Seasonal allergies   . Smoker   . Urine incontinence    Social History   Socioeconomic History  . Marital status: Single    Spouse name: None  . Number of children: None  . Years of education: None  . Highest education level: None  Social Needs  . Financial resource strain: None  . Food insecurity - worry: None  . Food insecurity - inability: None  . Transportation needs - medical: None  . Transportation needs - non-medical: None  Occupational History  . None  Tobacco Use  . Smoking status: Current Every Day Smoker    Packs/day: 0.50    Years: 45.00    Pack years: 22.50    Types: Cigarettes  . Smokeless tobacco: Never Used  . Tobacco comment: 1/2 pk per day  Substance and Sexual Activity  . Alcohol use: No    Alcohol/week: 0.0 oz  . Drug use: No  . Sexual activity: None  Other Topics Concern  . None  Social History Narrative   Disability since 2015.  Previously worked at [employer] for 10 years.    He lives at home with mother with dementia.   He has one daughter.    Family History  Problem Relation Age of Onset  . Arthritis Mother   . Hypertension Mother   . Heart disease Maternal Grandmother   . Mental illness Maternal  Grandmother    Scheduled Meds: . budesonide  0.25 mg Nebulization BID  . collagenase   Topical Daily  . mirtazapine  15 mg Oral QHS  . morphine CONCENTRATE  5 mg Oral Q6H   Continuous Infusions: . sodium chloride 20 mL/hr at 02/23/18 2030   PRN Meds:.morphine CONCENTRATE Medications Prior to Admission:  Prior to Admission medications   Medication Sig Start Date End Date Taking? Authorizing Provider  acetaminophen (TYLENOL) 650 MG CR tablet Take 650 mg by mouth every 4 (four) hours as needed for pain.   Yes [provider]  Amino Acids-Protein Hydrolys (FEEDING SUPPLEMENT, PRO-STAT SUGAR FREE 64,) LIQD Take 30 mLs by mouth 3 (three) times daily with meals.   Yes [provider]  atorvastatin (LIPITOR) 80 MG tablet  Place 1 tablet (80 mg total) into feeding tube daily at 6 PM. 09/26/17  Yes Conte, Tessa N, PA-C  budesonide (PULMICORT) 0.25 MG/2ML nebulizer solution Take 2 mLs (0.25 mg total) by nebulization 2 (two) times daily. 09/26/17  Yes Conte, Tessa N, PA-C  collagenase (SANTYL) ointment Apply topically daily. 09/27/17  Yes Conte, Tessa N, PA-C  levothyroxine (SYNTHROID, LEVOTHROID) 25 MCG tablet Take 25 mcg by mouth daily before breakfast.   Yes [provider]  lidocaine (LIDODERM) 5 % Place 1 patch onto the skin daily. Remove & Discard patch within 12 hours or as directed by MD   Yes [provider]  metoCLOPramide (REGLAN) 5 MG tablet Take 5 mg by mouth 4 (four) times daily.   Yes [provider]  mirtazapine (REMERON) 15 MG tablet Take 15 mg by mouth at bedtime.   Yes [provider]  ondansetron (ZOFRAN-ODT) 4 MG disintegrating tablet Take 4 mg by mouth every 6 (six) hours as needed for nausea or vomiting.   Yes [provider]  pantoprazole sodium (PROTONIX) 40 mg/20 mL PACK Place 20 mLs (40 mg total) into feeding tube daily. 09/27/17  Yes Conte, Tessa N, PA-C  tamsulosin (FLOMAX) 0.4 MG CAPS capsule Take 0.4 mg by  mouth daily.   Yes [provider]  traMADol (ULTRAM) 50 MG tablet Take 50 mg by mouth every 8 (eight) hours as needed for moderate pain.    Yes [provider]  clopidogrel (PLAVIX) 75 MG tablet Take 1 tablet by mouth daily. Patient not taking: Reported on 02/21/2018 01/12/18   Maeola Harman, MD  ondansetron [facility]) 4 MG/2ML SOLN injection Inject 2 mLs (4 mg total) into the vein every 6 (six) hours as needed for nausea or vomiting. Patient not taking: Reported on 02/21/2018 09/26/17   Sharlene Dory, PA-C  warfarin (COUMADIN) 3 MG tablet Take 1 tablet (3 mg total) by mouth one time only at 6 PM. Patient not taking: Reported on 02/21/2018 09/26/17   Sharlene Dory, PA-C   Allergies  Allergen Reactions  . Tylenol [Acetaminophen] Other (See Comments)    unknown  . Aspirin Other (See Comments)    Stomach upset  . Latex Rash  . Oxycodone-Acetaminophen Itching   Review of Systems  Constitutional: Positive for unexpected weight change.  Musculoskeletal:       Patient c/o leg pain 10/10  Neurological: Positive for weakness.    Physical Exam  Constitutional: He appears cachectic. He is cooperative. He appears ill.  Cardiovascular: Normal rate, regular rhythm and normal heart sounds.  Pulmonary/Chest: He has decreased breath sounds.  Skin: Skin is warm and dry.    Vital Signs: BP (!) 87/64 (BP Location: Right Arm)   Pulse 86   Temp 97.7 F (36.5 C) (Oral)   Resp (!) 22   Ht 5\' 8"  (1.727 m)   Wt 67.6 kg (149 lb)   SpO2 96%   BMI 22.66 kg/m  Pain Assessment: No/denies pain   Pain Score: 0-No pain   SpO2: SpO2: 96 % O2 Device:SpO2: 96 % O2 Flow Rate: .O2 Flow Rate (L/min): 1 L/min  IO: Intake/output summary:   Intake/Output Summary (Last 24 hours) at 02/25/2018 1358 Last data filed at 02/25/2018 1100 Gross per 24 hour  Intake 935 ml  Output 800 ml  Net 135 ml    LBM: Last BM Date: 02/24/18 Baseline Weight: Weight: 67.6 kg (149 lb) Most recent  weight: Weight: 67.6 kg (149 lb)  Palliative Assessment/Data: 30 % at best   Flowsheet Rows     Most Recent Value  Intake Tab  Referral Department  Hospitalist  Unit at Time of Referral  Med/Surg Unit  Palliative Care Primary Diagnosis  Other (Comment)  Date Notified  02/22/18  Palliative Care Type  New Palliative care  Reason for referral  Clarify Goals of Care  Date of Admission  02/21/18  Date first seen by Palliative Care  02/23/18  # of days Palliative referral response time  1 Day(s)  # of days IP prior to Palliative referral  1  Clinical Assessment  Psychosocial & Spiritual Assessment  Palliative Care Outcomes      Discussed with Dr Eddie CandleJ. Huang  Time In: 1245 Time Out: 1400 Time Total: 75 min Greater than 50%  of this time was spent counseling and coordinating care related to the above assessment and plan.  Signed by: Lorinda CreedMary Jance Siek, NP   Please contact Palliative Medicine Team phone at [phone] for questions and concerns.  For individual provider: See Loretha StaplerAmion

## 2018-02-25 NOTE — Progress Notes (Addendum)
Subjective:  [name] was resting comfortably in bed this morning. Had a 5 beat run of NSVT this morning. Denies chest pain or shortness of breath. He did endorse nausea and mild abdominal pain this morning. RN noted that G tube had migrated in to ~4cm mark, he pulled it back and taped it. Tolerating CLD, although RN notes he did not drink much. Reports he has a friend (whom he calls a brother) and daughter in [place], [place]. Does not talk to his daughter much.  Palliative Care meeting today with patient's lawyer and friend/brother - patient is now full comfort care  Objective:  Vital signs in last 24 hours: Vitals:   02/24/18 1954 02/24/18 2025 02/25/18 0008 02/25/18 0407  BP: (!) 87/66  (!) 88/63 (!) 87/64  Pulse: 95  87 86  Resp: (!) 24  17 (!) 22  Temp: 98.4 F (36.9 C)  (!) 97.4 F (36.3 C) 97.7 F (36.5 C)  TempSrc: Oral  Oral Oral  SpO2: 99% 98% 97% 96%  Weight:      Height:       GEN: Chronically ill, thin male lying in bed. Alert and oriented. No acute distress. Edentulous RESP: Clear to auscultation bilaterally. No increased work of breathing, on RA. CV: Normal rate and regular rhythm. No murmurs, gallops, or rubs. No LE edema. Well-healed midline sternotomy scar with 1x1cm round pink wound at the bottom end of the scar. ABD: Soft. Tenderness to palpation of LLQ. Non-distended. Normoactive bowel sounds. G-tube site without evidence of erythema or tenderness. EXT: No edema. Warm. 2+ DP pulses. Bilateral feet in space boots. Bilateral heel pressure injuries, R worse than L SACRUM: ~5x7cm unstageable sacral pressure injury with feculent matter, overlying dressing. NEURO: Cranial nerves II-XII grossly intact. Able to lift all four extremities against gravity. No apparent audiovisual hallucinations. Speech fluent and appropriate. PSYCH: Patient is calm and pleasant. Appropriate affect. Well-groomed; speech is appropriate and on-subject.  Assessment/Plan:  Active Problems:  Ulcer of heel and midfoot (HCC)   Hematemesis   Sacral pressure ulcer   Normocytic anemia   Gastrointestinal tube present (HCC)   Hyponatremia   Sepsis (HCC)   Adult failure to thrive   Palliative care by specialist   DNR (do not resuscitate) discussion  [name] is a 67yo male with PMH of afib, CAD s/p CABG x4 in 08/2017, diabetes, HTN, PVD s/p L fem-pop bypass, HLD, COPD, and recent ex-lap in 09/2017 for pneumoperitoneum from leaking G-tube, and PEG tube who presents from SNF with an episode of coffee ground emesis, no further recurrence, although Hb has dropped to 7.8 from baseline ~8.5-10. CT A/P on admission does not show an etiology for his hematemesis, but did note concern for sacral osteomyelitis. He was started on Vanc/zosyn in the ED. Afebrile and HDS. Hyponatremia improving with fluids. EGD showed dislodged PEG tube but no active bleeding. Patient is now full comfort care, per Palliative Care discussion today.  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 Patient is hemodynamically stable and remains afebrile, however antibiotics were already started in the ED. Antibiotics to be discontinued now that patient is being transitioned to full comfort care - Wound care consulted; appreciate their assistance - Hydrotherapy, per wound care - Was on vancomycin and zosyn (3/16 -> today) - F/u BCx 3/16 -> NG x3d - Further management, pending palliative care discussion - D/c antibiotics (full comfort care)  Hypotonic hyponatremia, resolved with IVF Na 135 this AM, resolved with IVF. Likely secondary to  hypovolemia and low dietary intake. - BMP in AM  Hypokalemia, improved K 3.4 this AM, Mg 1.8 - PO K 40mEq x1 - BMP in AM - Will d/c IVF as patient is now full comfort  Hematemesis, resolved EGD showed no source of bleeding, but did show that PEG tube was dislodged and obstructing outlet. Emesis possibly secondary to obstruction. IR repositioned PEG  tube. OK to use PEG tube. No recurrent episodes while inpatient. Hb did drop to 7.3 this AM. (baseline ~8.5-10). Will continue to monitor closely. RN repositioned PEG tube again this morning as he had noted that it had migrated in. - Continue telemetry - CBC in AM - Continue CLD  Hx of atrial fibrillation/flutter In setting of CABG and acute illness. S/p DCCV 08/2017. Was on amiodarone, which has since been discontinued. Also was on home warfarin, however most recent note from CVTS in 12/2017 notes he has remained in NSR for 3 months post-op and may no longer require warfarin. Currently in NSR, HR in 90s-100s. - Telemetry - Continue holding home warfarin  CAD s/p CABG x4 in 08/2017 New RBBB Troponins peaked at 0.03. EKG without ischemic changes. Denies current chest pain. - Hold home plavix in setting of hematemesis  Hx of HTN BP continues to be soft 95/66, this AM. Not on any home anti-hypertensives. Per chart review, patient has soft-normal BP since recent prolonged hospitalizations. - Continue to monitor  Hx of Diabetes Not on any home medications. Currently on gabapentin 900mg  BID and tramadol 50mg  TID PRN for neuropathy. Can consider cymbalta, aspercream, or lidocaine ointment if still no relief. CBG 105-148. - CBG monitoring q4h - Continue home tramadol 50mg  q8h PRN  COPD Home regimen includes budesonide nebulizer. Stable, no wheezing on exam. - Continue home budenoside nebulizer PRN for SOB or wheezing  Severe deconditioning, malnourishment Presenting from SNF, reports that he has been [facility] Has had multiple hospitalizations recently. - IV thiamine, folate  Goals of Care Currently full code. Has had recent prolonged hospitalization, sacral osteomyelitis, PEG tube with chronic debility. Reports he has a daughter and brother who live in [place], [place]. Talks to them occasionally. Concern that patient is not fully aware of the severity of his  situation. Discussed with Palliative Medicine team that patient has a close friend (whom he calls brother) who is willing to take on assistance with medical decisions. Meeting planned for this afternoon. - Palliative Care consulted; appreciate their assistance - Family meeting today at 1pm - Patient is now full comfort care, will d/c IVF and antibiotics - May qualify for residential hospice - will re-assess in AM  Dispo: Anticipated discharge in approximately 2-3 day(s).   Clinton Harrington, Clinton Cech, MD 02/25/2018, 7:39 AM Pager: Demetrius CharityP [phone]

## 2018-02-25 NOTE — Progress Notes (Signed)
   02/25/18 1532  Clinical Encounter Type  Visited With Patient  Visit Type Initial  Referral From Nurse;Palliative care team  Consult/Referral To Chaplain  Spiritual Encounters  Spiritual Needs Prayer   [facility].  Patient was in the bed and alone.  He said he was very tired.  Told him I would not stay long.  He said the reason he wanted to see a Chaplain is because he is worried about his long time friend Rosalia HammersRay (who is Licensed conveyancerHCPA).  Said he feels for Ray since he is doing so much for him.  Asked if I would come back tomorrow and see his friend.  I assured him I would come by tomorrow.  Will follow and support as needed. Chaplain Agustin CreeNewton Nikea Settle

## 2018-02-25 NOTE — Consult Note (Signed)
   [facility] Consult   02/25/2018  Rhae HammockReginald E Kamphuis 04-22-1951 [id number]    [facility] follow [facility].  Chart reviewed. Noted palliative medicine notes. [name] remains full code.  [name] [facility] prior to hospital admission.  Spoke with inpatient RNCM.  Raiford NobleAtika Kristol Almanzar, MSN-Ed, RN,BSN [facility] Care Management Hospital Liaison [phone]

## 2018-02-25 NOTE — Progress Notes (Signed)
Initial Nutrition Assessment  DOCUMENTATION CODES:   Not applicable  INTERVENTION:    Recommend swallow evaluation prior to PO diet advancement   If TF started, rec nocturnal TF regimen of Jevity 1.2 at 60 ml/hr x 12 hrs (7 PM-7 AM)  Provides 864 kcals, 40 gm protein, 581 ml of free water  NUTRITION DIAGNOSIS:   Increased nutrient needs related to chronic illness, wound healing as evidenced by estimated needs  GOAL:   Patient will meet greater than or equal to 90% of their needs  MONITOR:   PO intake, Labs, Skin, Weight trends, I & O's  REASON FOR ASSESSMENT:   Malnutrition Screening Tool  ASSESSMENT:   67 yo Male with PMH of CAD s/p CABG x 4, COPD, and recent ex-lap in 09/2017 for pneumoperitoneum from leaking G-tube, and PEG tube who presents from SNF with an episode of coffee ground emesis.  Pt is s/p G-tube placed per IR in October 2018. Tube adjusted & secured per IR at bedside 02/23/18.  Pt minimally conversant with this RD upon visit. [facility] regarding pt's nutrition care PTA. RN reported pt's PO intake would vary. They were not using his G-tube for nutrition support.  He is currently on Clear Liquids and tolerating. Palliative Medicine Team following for GOC. Labs and medications reviewed. K 3.4 (L). CBG's [id number].  Per readings below, pt's weight has significantly decreased over the past 4-5 months. ? accuracy.  NUTRITION - FOCUSED PHYSICAL EXAM:  Unable to complete at this time.  Diet Order:  Diet clear liquid Room service appropriate? Yes; Fluid consistency: Thin  EDUCATION NEEDS:   Not appropriate for education at this time  Skin:  Skin Assessment: Skin Integrity Issues: Skin Integrity Issues:: Unstageable, Other (Comment) Unstageable: L heel, R heel & sacrum Other: surgical wound to sternum  Last BM:  3/19   Intake/Output Summary (Last 24 hours) at 02/25/2018 1433 Last data filed at 02/25/2018  1100 Gross per 24 hour  Intake 935 ml  Output 800 ml  Net 135 ml   Height:   Ht Readings from Last 1 Encounters:  02/23/18 5\' 8"  (1.727 m)   Weight:   Wt Readings from Last 1 Encounters:  02/23/18 149 lb (67.6 kg)   Wt Readings from Last 10 Encounters:  02/23/18 149 lb (67.6 kg)  10/09/17 174 lb 2.6 oz (79 kg)  09/26/17 202 lb (91.6 kg)  03/14/17 200 lb (90.7 kg)  12/04/16 200 lb (90.7 kg)  11/28/16 200 lb (90.7 kg)  08/28/16 208 lb (94.3 kg)  07/01/16 202 lb 2 oz (91.7 kg)  05/23/16 197 lb 11.2 oz (89.7 kg)  03/28/16 194 lb 3 oz (88.1 kg)   Ideal Body Weight:  70 kg  BMI:  Body mass index is 22.66 kg/m.  Estimated Nutritional Needs:   Kcal:  1700-1900  Protein:  80-95 gm  Fluid:  1.7-1.9 L  Maureen ChattersKatie Mats Jeanlouis, RD, LDN Pager #: [phone] After-Hours Pager #: [phone]

## 2018-02-26 DIAGNOSIS — Z7189 Other specified counseling: Secondary | ICD-10-CM

## 2018-02-26 DIAGNOSIS — A419 Sepsis, unspecified organism: Principal | ICD-10-CM

## 2018-02-26 DIAGNOSIS — R627 Adult failure to thrive: Secondary | ICD-10-CM

## 2018-02-26 DIAGNOSIS — Z66 Do not resuscitate: Secondary | ICD-10-CM

## 2018-02-26 DIAGNOSIS — R52 Pain, unspecified: Secondary | ICD-10-CM

## 2018-02-26 LAB — CULTURE, BLOOD (ROUTINE X 2)
Culture: NO GROWTH
Culture: NO GROWTH
SPECIAL REQUESTS: ADEQUATE

## 2018-02-26 LAB — GLUCOSE, CAPILLARY
Glucose-Capillary: 86 mg/dL (ref 65–99)
Glucose-Capillary: 80 mg/dL (ref 65–99)
Glucose-Capillary: 104 mg/dL — ABNORMAL HIGH (ref 65–99)

## 2018-02-26 NOTE — Progress Notes (Signed)
CSW informed of recommendation for residential hospice.  CSW spoke with HCPOA Clinton Harrington and confirmed desire for residential hospice placement and [facility]  CSW [facility]- they will review case for possible admission but do not anticipate availability today  CSW will continue to follow  Burna SisJenna H. Amali Uhls, LCSW Clinical Social Worker [phone]

## 2018-02-26 NOTE — Progress Notes (Signed)
Palliative Medicine RN Note:  Our office rec'd a call from Clinton Harrington, patient's attorney/conservator, who asked about why HCPOA has to sign consents. Per both PMT NP Corrie DandyMary and BP RN Amy, patient is too confused to sign his own consents. Amy further stated there is not a [facility] bed available today, and they will make arrangements with HCPOA to sign consents when there is a bed and [name] is approved.  I called [name]'s office and left message w his assistant Clinton Harrington [phone]) with this information.   Margret ChanceMelanie G. Azalea Cedar, RN, BSN, [facility] Palliative Medicine Team 02/26/2018 2:46 PM Office [phone]

## 2018-02-26 NOTE — Progress Notes (Signed)
[facility]:  RN visit  Received request from Morgan HillJenna, CSW, for [facility]  Chart reviewed and spoke with Teena Iraniay, HCPOA, to acknowledge referral.  Unfortunately, [facility] is not able to offer a room today.  Both HCPOA and CSW are aware [facility] liaison will follow up with them tomorrow or sooner if room becomes available.  Please do not hesitate to call with questions.    Thank you for this referral.   Adele BarthelAmy Evans, RN. BSN [facility] Hospital Liaison [phone]   All hospital liaisons are now on AMION.

## 2018-02-26 NOTE — Progress Notes (Signed)
Nutrition Follow Up/Brief Note  Chart reviewed. Pt now transitioning to comfort care.  No nutrition interventions warranted at this time.  Please consult as needed.   Maureen ChattersKatie Zayvier Caravello, RD, LDN Pager #: [phone] After-Hours Pager #: [phone]

## 2018-02-26 NOTE — Progress Notes (Signed)
Subjective:  After Palliative Care discussion yesterday with patient's lawyer, friend, and patient, [name] is now full comfort care. Appears comfortable upon entering the room. Lethargic but arousable. Denies pain or other complaints.   Objective:  Vital signs in last 24 hours: Vitals:   02/25/18 2043 02/25/18 2105 02/26/18 0300 02/26/18 0739  BP:  106/70 101/63   Pulse:  91 91   Resp:  (!) 21 20   Temp:  98.1 F (36.7 C) 98.3 F (36.8 C)   TempSrc:  Oral Oral   SpO2: 98% 97% 99% 99%  Weight:      Height:       GEN: Chronically ill, thin male lying in bed. Alert and oriented. No acute distress. Edentulous. Lethargic but arousable. RESP: Clear to auscultation bilaterally. No increased work of breathing, on RA. CV: Normal rate and regular rhythm. No murmurs, gallops, or rubs. No LE edema. Well-healed midline sternotomy scar with 1x1cm round pink wound at the bottom end of the scar. ABD: Soft. Non-tender. Non-distended. Normoactive bowel sounds. G-tube site without evidence of erythema or tenderness. EXT: No edema. Warm. 2+ DP pulses. Bilateral feet in space boots. Bilateral heel pressure injuries, R worse than L SACRUM: ~5x7cm unstageable sacral pressure injury with feculent matter, overlying dressing. NEURO: Cranial nerves II-XII grossly intact. Able to lift all four extremities against gravity. No apparent audiovisual hallucinations. Speech fluent and appropriate. PSYCH: Patient is calm. Appropriate affect. Well-groomed; speech is appropriate and on-subject.  Assessment/Plan:  Active Problems:   Ulcer of heel and midfoot (HCC)   Hematemesis   Sacral pressure ulcer   Normocytic anemia   Gastrointestinal tube present (HCC)   Hyponatremia   Sepsis (HCC)   Adult failure to thrive   Palliative care by specialist   DNR (do not resuscitate) discussion   Pain, generalized  [name] is a 67yo male with PMH of afib, CAD s/p CABG x4 in 08/2017, diabetes, HTN, PVD s/p L  fem-pop bypass, HLD, COPD, and recent ex-lap in 09/2017 for pneumoperitoneum from leaking G-tube, and PEG tube who presents from SNF with an episode of hematemesis and sacral and calcaneal osteomyelitis. After discussion with Palliative Care, patient has decided to forgo further therapies and is now full comfort care.  Goals of Care Palliative Care discussion yesterday with Palliative Medicine team, patient, patient's friend, and patient's lawyer. Decision was made to be DNR/DNI with full comfort care. May qualify for residential hospice. Patient denies current pain or other complaints. 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 EKG without ischemic changes. Denies current chest pain.  Hx of HTN BP 101/63  COPD Home regimen includes budesonide nebulizer. Stable, no wheezing on exam. - Continue budenoside nebulizer BID  Dispo: Anticipated discharge in approximately 1-2 day(s).   Scherrie GerlachHuang, Raeden Schippers, MD 02/26/2018, 8:00 AM Pager:  P [phone]

## 2018-02-26 NOTE — Progress Notes (Signed)
Patient ID: Clinton HammockReginald E Leinberger, male   DOB: Aug 11, 1951, 67 y.o.   MRN: [id number]  This NP visited patient at the bedside as a follow up to  yesterday's GOCs meeting for palliative medicine needs and emotional support.  Patient is arousable but lethargic, is taking minimal sips, appears comfortable with scheduled Roxanol.   Placed call and spoke to healthcare power of attorney/Ray Laural BenesJohnson and his wife Velna HatchetSheila to update them and confirm that the focus of care remains comfort, quality and dignity.  Plan of care is comfort -DNR/DNI -No artificial feeding now or in the future -No further diagnostics -No IV antibiotics -Comfort feeds as tolerated -Symptom management to enhance comfort  Prognosis is less than 2 weeks.  All are hopeful for residential hospice for end-of-life care.  Will write for choice  Discussed HPOA the importance of continued conversation with family and their  medical providers regarding overall plan of care and treatment options,  ensuring decisions are within the context of the patients values and GOCs.  Questions and concerns addressed     Time in  0940         Time out  1010  Total time spent on the unit was 30 minutes  Greater than 50% of the time was spent in counseling and coordination of care  Lorinda CreedMary Larach NP  Palliative Medicine Team Team Phone # [phone] Pager [phone]

## 2018-02-26 NOTE — Progress Notes (Addendum)
  Pt w/ no urine output so far this shift, very lethargic, low BP. Relayed to covering Internal Med. Teaching Service MD  No official comfort care order for pt. Spoke w/ IMTS and Palliative Med who both confirm pt is comfort cares. Palliative Med. RN states will put order in now

## 2018-02-26 NOTE — Plan of Care (Signed)
  Problem: Elimination: Goal: Will not experience complications related to urinary retention Outcome: Progressing Note:  Pt had good output overnight, 600 mL in total. No signs of retention.

## 2018-02-26 NOTE — Care Management Important Message (Signed)
Important Message  Patient Details  Name: Rhae HammockReginald E Schutter MRN: [id number] Date of Birth: 03-09-1951   Medicare Important Message Given:  Yes    Sybilla Malhotra P Allenmichael Mcpartlin 02/26/2018, 12:19 PM

## 2018-02-27 MED ORDER — MORPHINE SULFATE (CONCENTRATE) 10 MG/0.5ML PO SOLN
5.0000 mg | Freq: Four times a day (QID) | ORAL | Status: DC
  Administered 2018-02-27 – 2018-02-28 (×4): 5 mg via ORAL
  Filled 2018-02-27 (×4): qty 0.5

## 2018-02-27 NOTE — Progress Notes (Addendum)
[facility]:  RN  Erskine SpeedSheila, HCPOA, called back and advised that they are unable to get here this evening ([place] in [place], [place] but that she would be able to come and do paperwork with liaison tomorrow morning at 1000am in the patient room.  She left her cell number of [phone] to contact, to verify appointment.  [facility] liaison will follow up on 02/28/18.  Thank you for this referral.  Adele BarthelAmy Evans, RN, BSN [facility] Liaison [phone]  All hospital liaisons are now on AMION.

## 2018-02-27 NOTE — Progress Notes (Addendum)
CSW received phone call from Amy with [facility] Amy informed CSW that they do have a bed available starting tomorrow. Amy explained that she has been trying to get in touch with pt's POA to have them fill out paperwork, but has been unsuccessful. Healthcare POAs are Ray and [employer]helia Johnson.   Plan: CSW will try to get in touch with pt's POA to have them come in tomorrow to fill out [facility] paperwork with Forrestine HimEva Davis, [phone], [facility] representative on for tomorrow, 3/23 starting at 8 am.   CSW left voicemail for [employer] Johnson at [phone]. CSW attempted to call mobile number listed, but went straight to voicemail at [phone]. CSW left voicemail.   Update: Murriel HopperShelia Johnson will be at the hospital 02/28/18 around 10 am to sign paperwork with [facility]. [facility] aware.   Montine CircleKelsy Prabhnoor Ellenberger, Silverio LayLCSWA [facility]  [phone]

## 2018-02-27 NOTE — Progress Notes (Signed)
   Subjective:  Continuing full comfort care. Appears comfortable upon entering the room. Lethargic but arousable. Denies pain.  Objective:  Vital signs in last 24 hours: Vitals:   02/26/18 0739 02/26/18 1425 02/26/18 1958 02/26/18 2357  BP:  (!) 81/65  (!) 85/65  Pulse:  92 88 92  Resp:  18 16 16   Temp:  98.3 F (36.8 C)  97.9 F (36.6 C)  TempSrc:  Oral  Axillary  SpO2: 99% 91% 99% 100%  Weight:      Height:       GEN: Chronically ill, thin male lying in bed. Alert and oriented. No acute distress. Edentulous. Lethargic but arousable. RESP: Clear to auscultation bilaterally. No increased work of breathing, on RA. CV: Normal rate and regular rhythm. No murmurs, gallops, or rubs. No LE edema. Well-healed midline sternotomy scar with 1x1cm round pink wound at the bottom end of the scar. ABD: Soft. Non-tender. Non-distended. Normoactive bowel sounds. G-tube in place. EXT: No edema. Warm. 2+ DP pulses. Bilateral feet in space boots. Bilateral heel pressure injuries, R worse than L SACRUM: ~5x7cm unstageable sacral pressure injury with feculent matter, overlying dressing. NEURO: Cranial nerves II-XII grossly intact. Able to lift all four extremities against gravity. No apparent audiovisual hallucinations. Speech fluent and appropriate. PSYCH: Patient is calm. Appropriate affect. Well-groomed; speech is appropriate and on-subject.  Assessment/Plan:  Active Problems:   Ulcer of foot, chronic, left, with necrosis of muscle (HCC)   Hematemesis   Sacral pressure ulcer   Normocytic anemia   Gastrointestinal tube present (HCC)   Hyponatremia   Sepsis (HCC)   Adult failure to thrive   Palliative care by specialist   DNR (do not resuscitate) discussion   Pain, generalized  Clinton Harrington is a 67yo male with PMH of afib, CAD s/p CABG x4 in 08/2017, diabetes, HTN, PVD s/p L fem-pop bypass, HLD, COPD, and recent ex-lap in 09/2017 for pneumoperitoneum from leaking G-tube, and PEG tube who  presents from SNF with an episode of hematemesis and sacral and calcaneal osteomyelitis. Patient has opted to forgo further medical therapies and is full comfort care.  Goals of Care Appreciate assistance from Palliative Medicine team in helping with arranging residential hospice discharge and ensuring patient is comfortable while inpatient. Patient is DNR/DNI. Denies current pain or other complaints. 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 Denies current pain.  COPD - Continue budenoside nebulizer BID  Dispo: Anticipated discharge in approximately 1-2 day(s).   Scherrie GerlachHuang, Yulianna Folse, MD 02/27/2018, 7:22 AM Pager: Demetrius CharityP [phone]

## 2018-02-27 NOTE — Progress Notes (Signed)
[facility]:  RN visit  Received request from HartingtonJenna, CSW, for [facility]  Chart reviewed and spoke with Teena Iraniay, HCPOA, to acknowledge referral on 02/26/18.  Unfortunately, [facility] not [facility] today.  Velna HatchetSheila (Ray's wife),  HCPOA and Eileen StanfordJenna, CSW are aware [facility] liaison will follow up with them tomorrow or sooner if room becomes available.  Please do not hesitate to call with questions.    Thank you for this referral.   Adele BarthelAmy Evans, RN. BSN [facility] [phone]   All hospital liaisons are now on AMION.

## 2018-02-27 NOTE — Progress Notes (Signed)
[facility]: RN note.  Patient is [facility] Saturday [facility]. Messages have been left with HCPOAs Ray Laural BenesJohnson and Lavone NianSheila Johnson. Adelina MingsKelsey, CSW made aware.    Hospital liaison will follow up Saturday morning.  Please call with any hospice related questions.  Thank you,  Elsie SaasMary Anne Robertson, RN, [facility]  [facility]ia[facility] [phone]  [facility] lia[facility] are on AMION

## 2018-02-27 NOTE — Care Management Note (Signed)
Case Management Note  Patient Details  Name: Clinton Harrington MRN: [id number] Date of Birth: 08-21-1951  Subjective/Objective:   Plan for Residential Hospice, CSW following. [facility].                  Action/Plan: DC to Residential Hospice Facility.   Expected Discharge Date:  02/24/18               Expected Discharge Plan:  Hospice Medical Facility  In-House Referral:  Clinical Social Work  Discharge planning Services  CM Consult  Post Acute Care Choice:    Choice offered to:     DME Arranged:    DME Agency:     HH Arranged:    HH Agency:     Status of Service:  Completed, signed off  If discussed at [employer] Meetings, dates discussed:    Additional Comments:  [name], [name], RN 02/27/2018, 10:22 AM

## 2018-02-27 NOTE — Progress Notes (Signed)
[facility].  CSW left message for pt HCPOA Ray Laural BenesJohnson to see if agreeable to other facility options- left voicemail  Burna SisJenna H. [facility] Teegarden, LCSW Clinical Social Worker [phone]

## 2018-02-28 DIAGNOSIS — Z886 Allergy status to analgesic agent status: Secondary | ICD-10-CM

## 2018-02-28 DIAGNOSIS — L89159 Pressure ulcer of sacral region, unspecified stage: Secondary | ICD-10-CM

## 2018-02-28 DIAGNOSIS — E11622 Type 2 diabetes mellitus with other skin ulcer: Secondary | ICD-10-CM

## 2018-02-28 DIAGNOSIS — Z885 Allergy status to narcotic agent status: Secondary | ICD-10-CM

## 2018-02-28 DIAGNOSIS — Z91040 Latex allergy status: Secondary | ICD-10-CM

## 2018-02-28 MED ORDER — MORPHINE SULFATE 10 MG/5ML PO SOLN: 5.0000 mg | ORAL | 0 refills | Status: AC | PRN

## 2018-02-28 MED ORDER — DIAZEPAM 2 MG PO TABS: 2.0000 mg | ORAL_TABLET | Freq: Three times a day (TID) | ORAL | 0 refills | Status: AC | PRN

## 2018-02-28 MED ORDER — MORPHINE SULFATE 10 MG/5ML PO SOLN: 5.0000 mg | Freq: Four times a day (QID) | ORAL | 0 refills | Status: AC

## 2018-02-28 NOTE — Consult Note (Signed)
[facility] available today. Paperwork has been completed with Erskine SpeedHCPOA Sheila.   Please send discharge summary to [phone].  RN Please call report to [phone].  Thank you,  Forrestine Himva Davis, LCSW [phone]

## 2018-02-28 NOTE — Clinical Social Work Note (Addendum)
CSW facilitated patient discharge including contacting patient family (Left voicemails for Ray and Lavone NianSheila Johnson) and facility to confirm patient discharge plans. Clinical information faxed to facility and family agreeable with plan. CSW arranged ambulance [facility] via [facility]. RN to call report prior to discharge [phone]).  CSW will sign off for now as social work intervention is no longer needed. Please consult us again if new needs arise.  Charlynn CourtSarah Jessica Checketts, CSW [phone]  12:34 pm Received call back from Lavone NianSheila Johnson and notified her that transport had been arranged. CSW asked if patient's daughter and lawyer needed to be contacted. Ms. Laural BenesJohnson asked that CSW notify them. CSW spoke with patient's daughter. She was asking about asking him what he wants to do with his house, long-term care, and wanting him moved closer to where she lives. CSW did not provide information other than that [facility] due to daughter not being HCPOA. CSW called and spoke with patient's lawyer, Lenard Forthavid Spangler, and notified him of transfer.   CSW signing off. Patient has been picked up by [facility].  Charlynn CourtSarah Mantaj Chamberlin, CSW [phone]

## 2018-02-28 NOTE — Progress Notes (Signed)
   Subjective:  Patient denies pain this morning and denies discomfort. He re-iterates that [facility].  Objective:  Vital signs in last 24 hours: Vitals:   02/27/18 1937 02/27/18 2313 02/28/18 0806 02/28/18 0901  BP:  (!) 76/59  (!) 78/53  Pulse:  100  96  Resp:  16  18  Temp:  98.7 F (37.1 C)  98.7 F (37.1 C)  TempSrc:  Oral  Axillary  SpO2: 92% 97% 96% 96%  Weight:      Height:       Gen: sleeping but wakes up easily Resp: no increased work of breathing CV: tachycardic Skin: pale  Assessment/Plan:  Active Problems:   Ulcer of foot, chronic, left, with necrosis of muscle (HCC)   Hematemesis   Sacral pressure ulcer   Normocytic anemia   Gastrointestinal tube present (HCC)   Hyponatremia   Sepsis (HCC)   Adult failure to thrive   Palliative care by specialist   DNR (do not resuscitate) discussion   Pain, generalized  [name] is a 67yo male with PMH of afib, CAD s/p CABG x4 in 08/2017, diabetes, HTN, PVD s/p L fem-pop bypass, HLD, COPD, and recent ex-lap in 09/2017 for pneumoperitoneum from leaking G-tube, and PEG tube who presents from SNF with an episode of hematemesis and sacral and calcaneal osteomyelitis. Patient has opted to forgo further medical therapies and is full comfort care.  Goals of Care Appreciate assistance from Palliative Medicine team in helping with arranging residential hospice discharge and ensuring patient is comfortable while inpatient. Patient is DNR/DNI. [facility] - Palliative Care consulted; appreciate their assistance - Hopeful d/c to residential hospice [facility] - DNR/DNI - Full comfort care, no IVF, no abx - No artificial feeds - Comfort feeds PO; PEG tube is not to be used. - PO solution morphine 5mg  q6 hr, morphine 5mg  q3h PRN for moderate pain/shortness of breath - IV ativan 1mg  q4h PRN for anxiety - budesonide neb BID  Dis[facility]: [facility].   Nyra MarketSvalina, Dekota Kirlin, MD 02/28/2018, 10:52 AM Pager: Demetrius CharityP [phone]

## 2018-02-28 NOTE — Clinical Social Work Note (Signed)
[facility] admissions coordinator has completed paperwork with family and are ready to accept patient. DNR on chart to be signed. CSW paged MD.  Clinton Harrington Joniah Bednarski, CSW [phone]

## 2018-03-09 DEATH — deceased

## 2019-07-09 IMAGING — DX DG CHEST 1V PORT
1 series · 1 of 1 positions shown · non-contrast
Comparison: 08/25/2017 .

CLINICAL DATA: MI.  Balloon pump.

EXAM:
PORTABLE CHEST 1 VIEW

[chest ap]
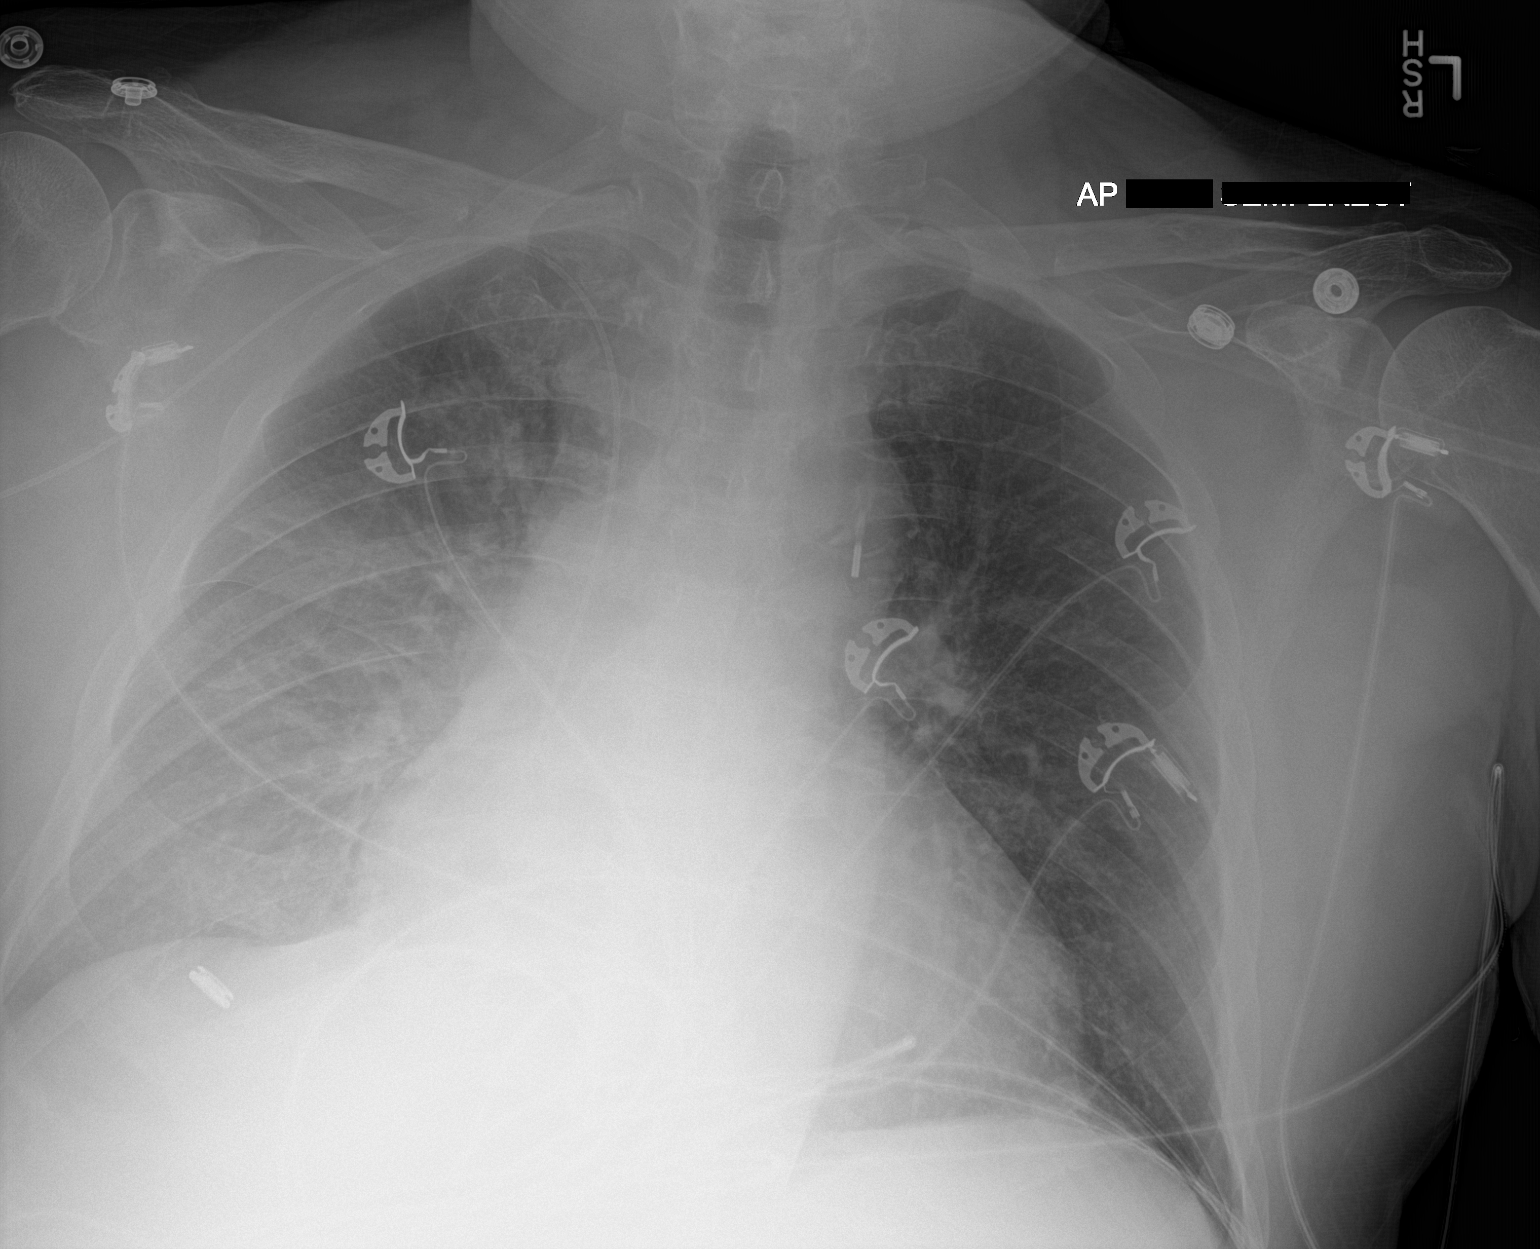

[1 of 1 positions shown; findings below may reference images not displayed]

FINDINGS: Right PICC line new in stable position. Balloon pump tip in stable
position in the upper descending thoracic aorta below the arch.

Cardiomegaly. Mild bilateral interstitial prominence, particular on
the right consistent CHF. No interim change. Small right pleural
effusion cannot be excluded. No pneumothorax P
IMPRESSION: 1.  Right PICC line and aortic balloon pump in stable position.

2. Findings consistent with congestive heart failure with mild
interstitial edema, primarily on the right. Small right pleural
effusion. No change from prior exam.

## 2019-07-10 IMAGING — DX DG CHEST 1V PORT
1 series · 1 of 1 positions shown · non-contrast
Comparison: 08/26/2017

CLINICAL DATA: CHF, balloon pump

EXAM:
PORTABLE CHEST 1 VIEW

[chest]
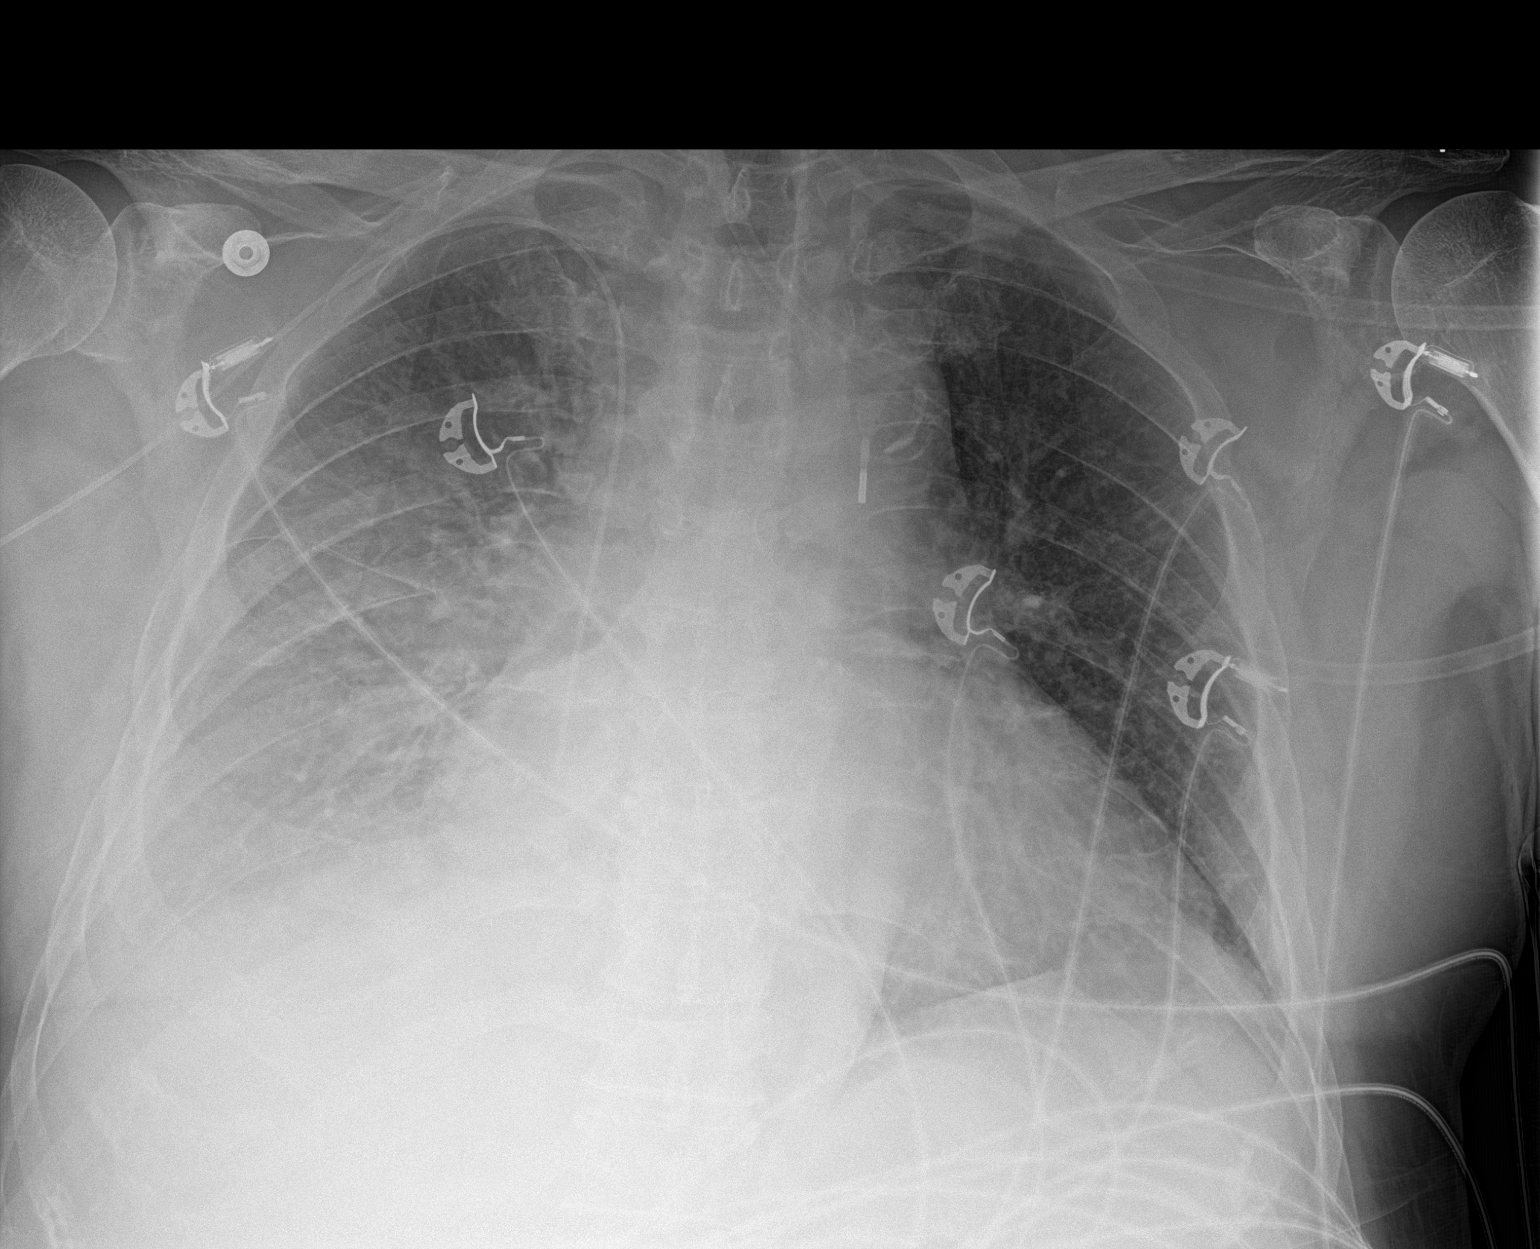

[1 of 1 positions shown; findings below may reference images not displayed]

FINDINGS: Entry of balloon pump tip remains projecting over the proximal
descending thoracic aorta. Right PICC line tip in the right atrium.
Cardiomegaly with bilateral airspace opacities, right greater than
left, likely mild asymmetric edema. Suspect small layering right
effusion.
IMPRESSION: IABP remains in stable position.

Slight increase bilateral airspace opacities, right worse than left,
likely asymmetric edema.

## 2019-07-11 IMAGING — CR DG CHEST 1V PORT
1 series · 1 of 1 positions shown · non-contrast
Comparison: Yesterday

CLINICAL DATA: Status post CABG.Question pneumothorax.

EXAM:
PORTABLE CHEST 1 VIEW

[AP]
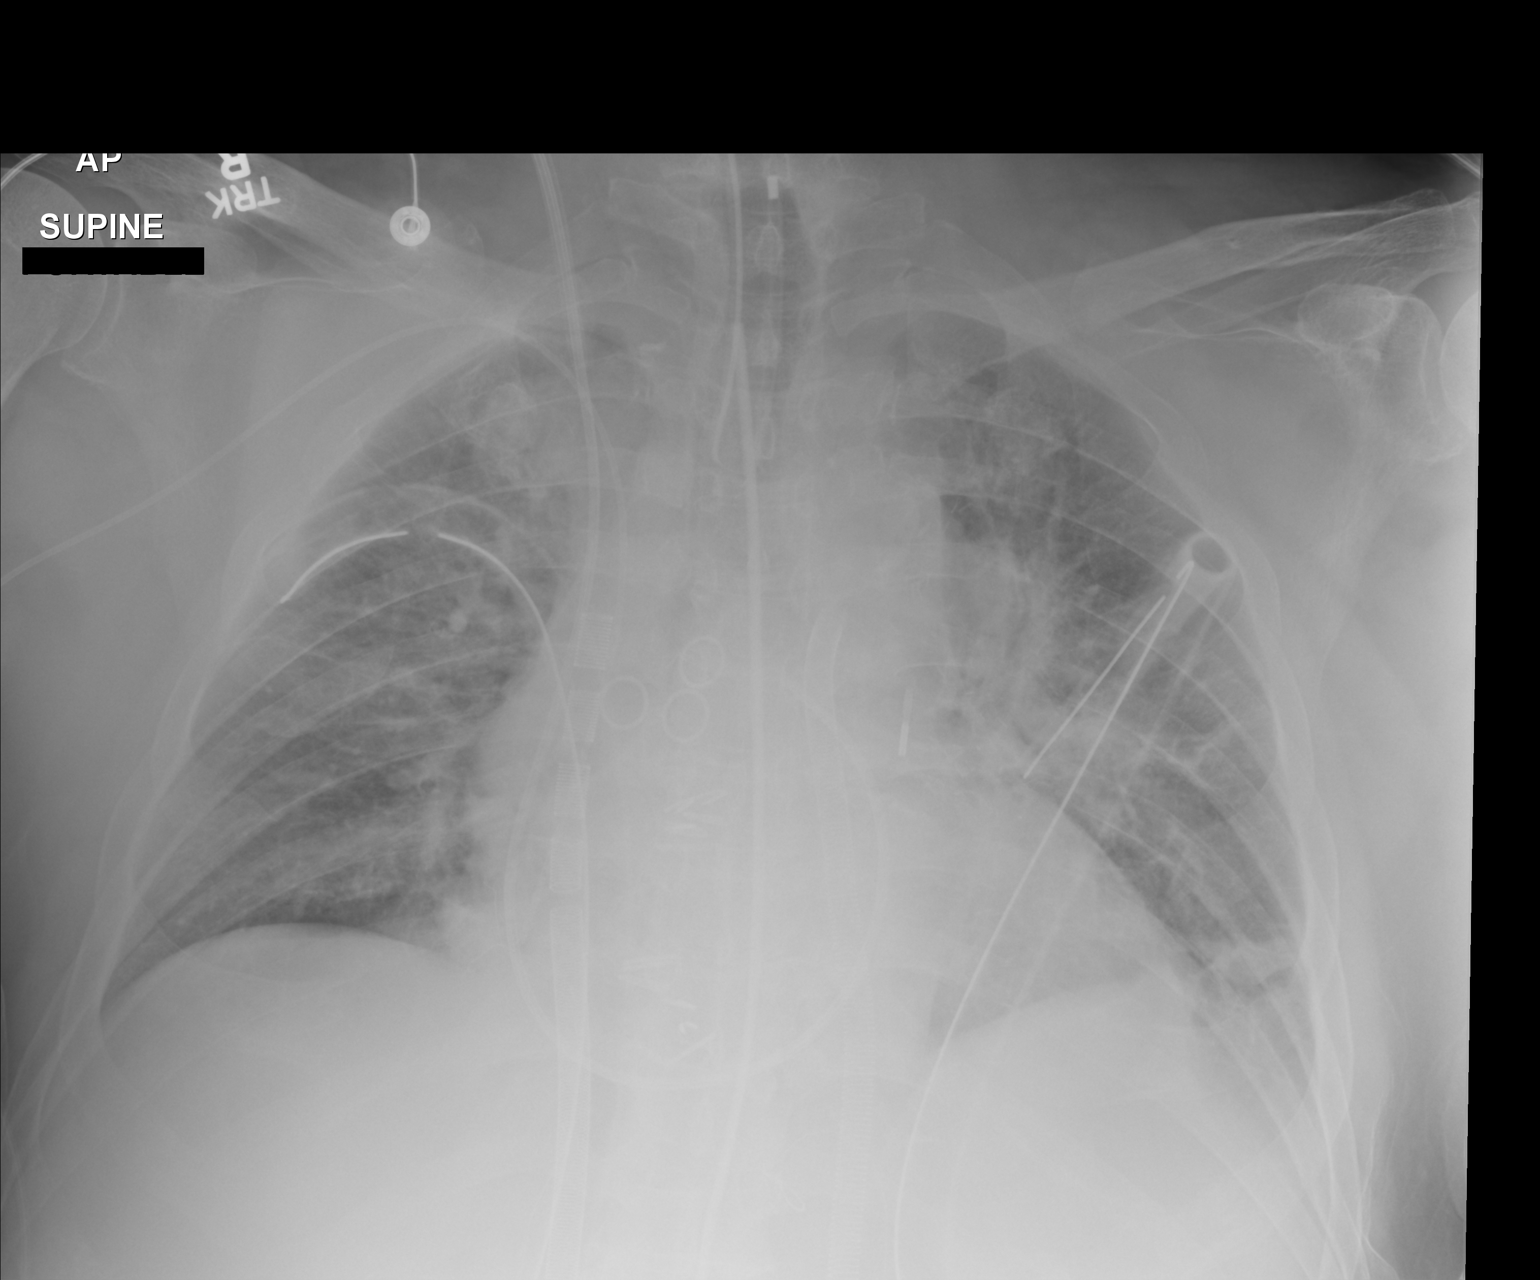

[1 of 1 positions shown; findings below may reference images not displayed]

FINDINGS: Right upper extremity PICC with tip at the lower right atrium.
Aortic balloon pump with tip 3 cm below the lower margin of the
aortic knob. Thoracic drains in place. Endotracheal tube with tip
between the clavicular heads and carina. Cannulae project over the
bilateral heart. Stable cardiomegaly. Swan-Ganz catheter from right
approach with tip directed towards the right main pulmonary artery.
Right pleural effusion appears evacuated. There is low lung volumes
with atelectasis and edema. No convincing or large pneumothorax. No
mediastinal wires seen.
IMPRESSION: 1. No convincing or large pneumothorax.
2. Low volume chest with atelectasis and edema. Evidence of interval
right pleural effusion evacuation.
3. Lower aortic balloon pump, the tip projects 3 cm below the lower
margin of the aortic arch. Other tubes and lines as described.

## 2019-07-12 IMAGING — CR DG ABD PORTABLE 1V
1 series · 1 of 1 positions shown · non-contrast
Comparison: None.

CLINICAL DATA: Cortrak feeding tube placement.

EXAM:
PORTABLE ABDOMEN - 1 VIEW

[AP]
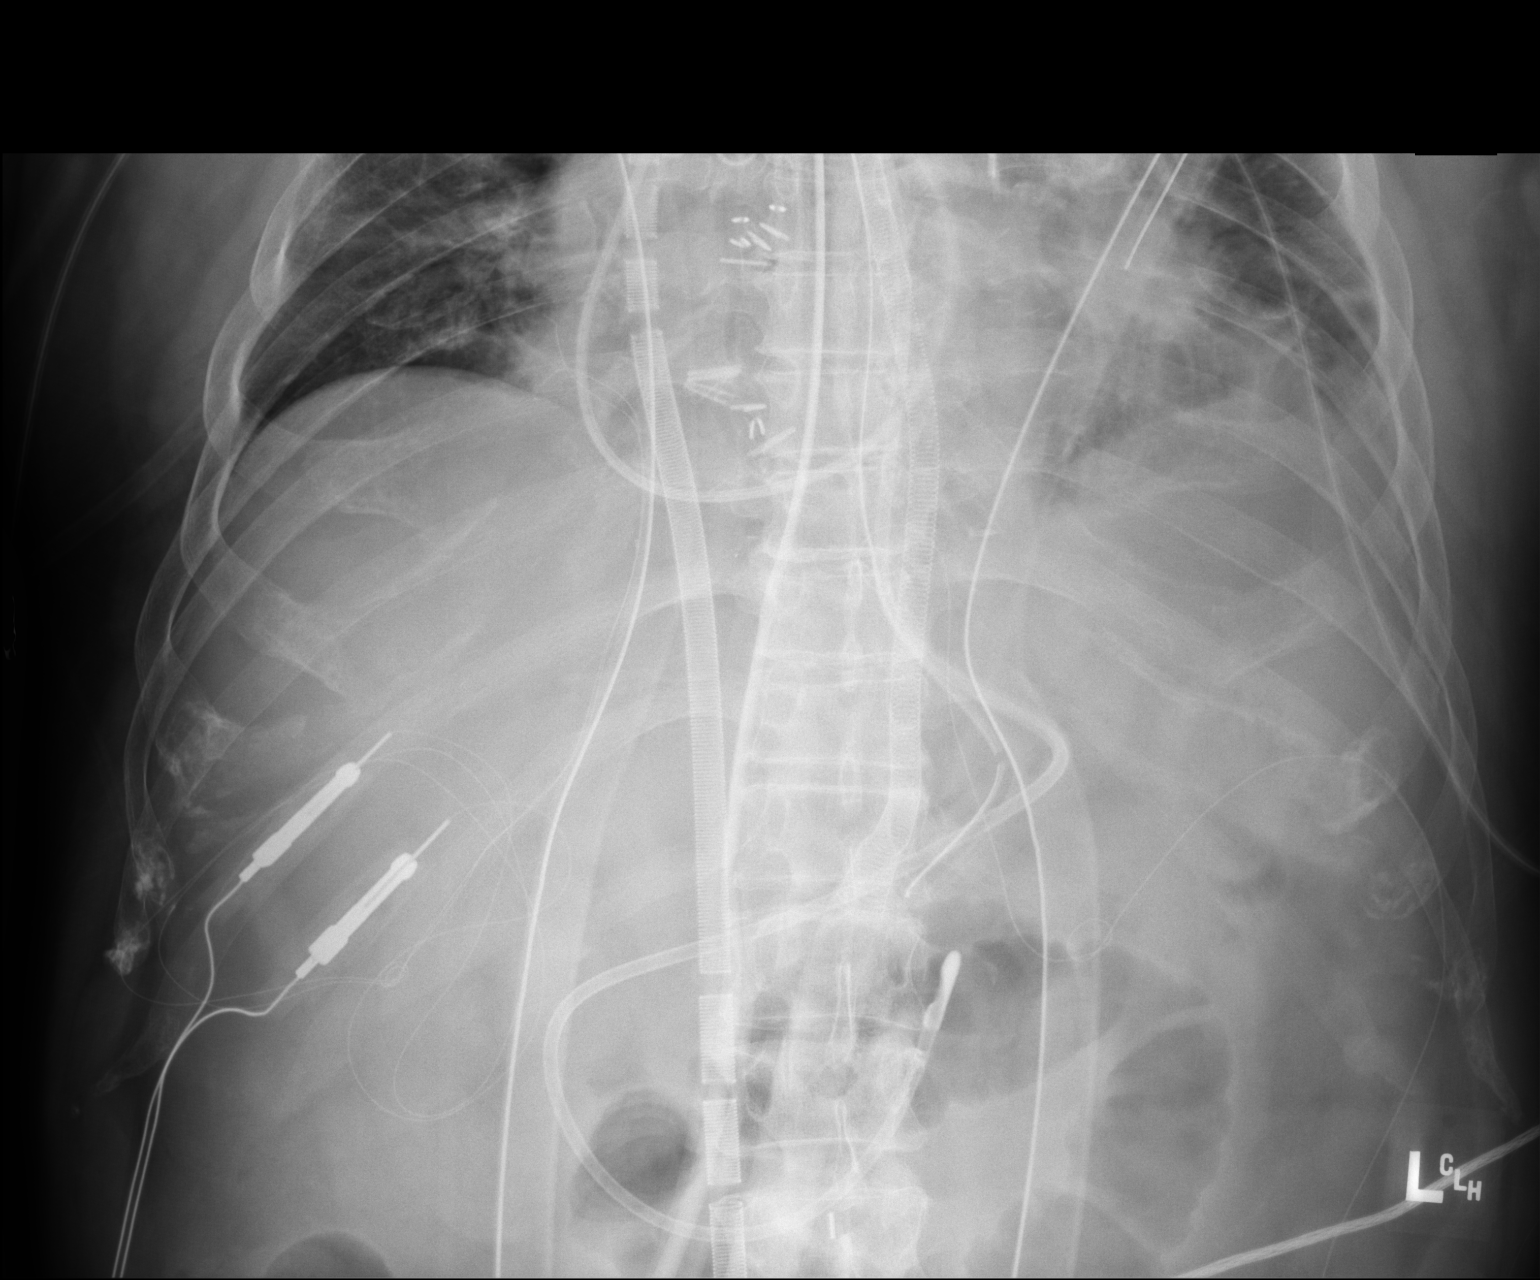

[1 of 1 positions shown; findings below may reference images not displayed]

FINDINGS: Feeding tube enters the stomach, passes into the duodenum and has
its tip at the ligament of Treitz. Nasogastric tube is present in
the stomach.
IMPRESSION: Feeding tube tip at the ligament of Treitz.

## 2019-07-14 IMAGING — CR DG CHEST 1V PORT
1 series · 1 of 1 positions shown · non-contrast
Comparison: August 30, 2017

CLINICAL DATA: History of CABG.

EXAM:
PORTABLE CHEST 1 VIEW

[AP]
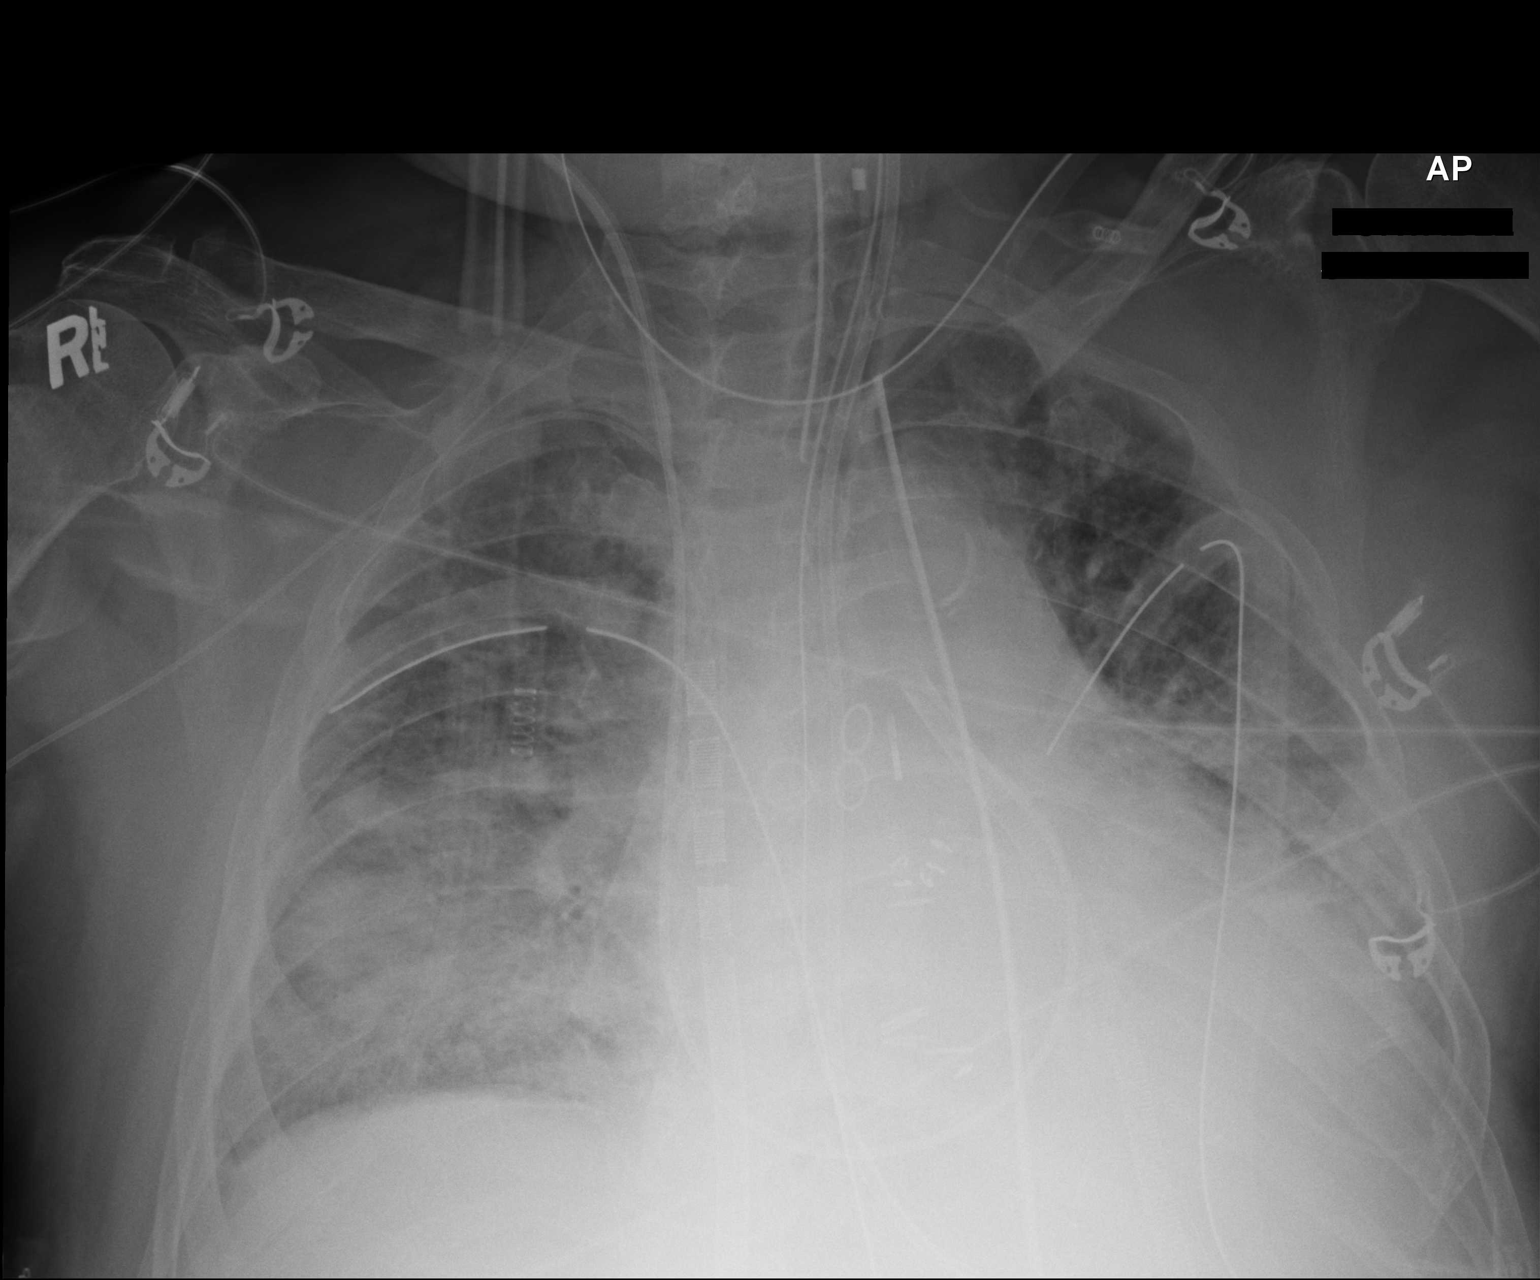

[1 of 1 positions shown; findings below may reference images not displayed]

FINDINGS: The ETT is in good position. The NG tube in the feeding tube
terminates below today's film. Chest tubes in are stable. No
pneumothorax. Increasing focal infiltrate in the right base.
Increasing opacity in the retrocardiac region. Suspected pulmonary
venous congestion. Stable right PICC line.
IMPRESSION: 1. Support apparatus as above.
2. Increasing infiltrate in the right base. Increasing opacity in
left retrocardiac region. Recommend follow-up to resolution.
